# Patient Record
Sex: Female | Born: 1955 | Race: White | Hispanic: No | Smoking: Current every day smoker
Health system: Southern US, Community
[De-identification: ages and names within clinical notes are randomized; demographics above are authoritative.]

## PROBLEM LIST (undated history)

## (undated) DIAGNOSIS — I739 Peripheral vascular disease, unspecified: Secondary | ICD-10-CM

## (undated) DIAGNOSIS — F419 Anxiety disorder, unspecified: Secondary | ICD-10-CM

## (undated) DIAGNOSIS — G4730 Sleep apnea, unspecified: Secondary | ICD-10-CM

## (undated) DIAGNOSIS — R011 Cardiac murmur, unspecified: Secondary | ICD-10-CM

## (undated) DIAGNOSIS — H269 Unspecified cataract: Secondary | ICD-10-CM

## (undated) DIAGNOSIS — J45909 Unspecified asthma, uncomplicated: Secondary | ICD-10-CM

## (undated) DIAGNOSIS — R112 Nausea with vomiting, unspecified: Secondary | ICD-10-CM

## (undated) DIAGNOSIS — I10 Essential (primary) hypertension: Secondary | ICD-10-CM

## (undated) DIAGNOSIS — E785 Hyperlipidemia, unspecified: Secondary | ICD-10-CM

## (undated) DIAGNOSIS — R0902 Hypoxemia: Secondary | ICD-10-CM

## (undated) DIAGNOSIS — I779 Disorder of arteries and arterioles, unspecified: Secondary | ICD-10-CM

## (undated) DIAGNOSIS — B029 Zoster without complications: Secondary | ICD-10-CM

## (undated) DIAGNOSIS — J449 Chronic obstructive pulmonary disease, unspecified: Secondary | ICD-10-CM

## (undated) DIAGNOSIS — I509 Heart failure, unspecified: Secondary | ICD-10-CM

## (undated) DIAGNOSIS — E119 Type 2 diabetes mellitus without complications: Secondary | ICD-10-CM

## (undated) DIAGNOSIS — T7840XA Allergy, unspecified, initial encounter: Secondary | ICD-10-CM

## (undated) DIAGNOSIS — I2510 Atherosclerotic heart disease of native coronary artery without angina pectoris: Secondary | ICD-10-CM

## (undated) DIAGNOSIS — Z9889 Other specified postprocedural states: Secondary | ICD-10-CM

## (undated) DIAGNOSIS — I350 Nonrheumatic aortic (valve) stenosis: Secondary | ICD-10-CM

## (undated) DIAGNOSIS — I219 Acute myocardial infarction, unspecified: Secondary | ICD-10-CM

## (undated) HISTORY — PX: BREAST BIOPSY: SHX20

## (undated) HISTORY — DX: Hypoxemia: R09.02

## (undated) HISTORY — DX: Disorder of arteries and arterioles, unspecified: I77.9

## (undated) HISTORY — DX: Heart failure, unspecified: I50.9

## (undated) HISTORY — DX: Unspecified asthma, uncomplicated: J45.909

## (undated) HISTORY — DX: Unspecified cataract: H26.9

## (undated) HISTORY — PX: EYE SURGERY: SHX253

## (undated) HISTORY — DX: Morbid (severe) obesity due to excess calories: E66.01

## (undated) HISTORY — PX: ABDOMINAL HYSTERECTOMY: SHX81

## (undated) HISTORY — DX: Allergy, unspecified, initial encounter: T78.40XA

## (undated) HISTORY — DX: Peripheral vascular disease, unspecified: I73.9

## (undated) HISTORY — DX: Type 2 diabetes mellitus without complications: E11.9

## (undated) HISTORY — DX: Essential (primary) hypertension: I10

## (undated) HISTORY — DX: Cardiac murmur, unspecified: R01.1

## (undated) HISTORY — PX: CHOLECYSTECTOMY: SHX55

## (undated) HISTORY — PX: SPINAL FUSION: SHX223

---

## 1978-01-25 HISTORY — PX: TOTAL ABDOMINAL HYSTERECTOMY: SHX209

## 2000-05-09 ENCOUNTER — Inpatient Hospital Stay (HOSPITAL_COMMUNITY): Admission: RE | Admit: 2000-05-09 | Discharge: 2000-05-11 | Payer: Self-pay | Admitting: Obstetrics and Gynecology

## 2000-05-09 ENCOUNTER — Encounter: Payer: Self-pay | Admitting: Obstetrics and Gynecology

## 2000-06-28 ENCOUNTER — Encounter: Admission: RE | Admit: 2000-06-28 | Discharge: 2000-09-26 | Payer: Self-pay | Admitting: Endocrinology

## 2003-04-21 ENCOUNTER — Emergency Department (HOSPITAL_COMMUNITY): Admission: EM | Admit: 2003-04-21 | Discharge: 2003-04-21 | Payer: Self-pay | Admitting: Emergency Medicine

## 2004-04-20 ENCOUNTER — Ambulatory Visit: Payer: Self-pay | Admitting: Cardiology

## 2004-04-20 ENCOUNTER — Observation Stay (HOSPITAL_COMMUNITY): Admission: EM | Admit: 2004-04-20 | Discharge: 2004-04-21 | Payer: Self-pay | Admitting: *Deleted

## 2004-11-29 ENCOUNTER — Emergency Department (HOSPITAL_COMMUNITY): Admission: EM | Admit: 2004-11-29 | Discharge: 2004-11-29 | Payer: Self-pay | Admitting: Emergency Medicine

## 2004-12-02 ENCOUNTER — Ambulatory Visit: Payer: Self-pay | Admitting: Pulmonary Disease

## 2004-12-02 ENCOUNTER — Ambulatory Visit (HOSPITAL_COMMUNITY): Admission: RE | Admit: 2004-12-02 | Discharge: 2004-12-03 | Payer: Self-pay | Admitting: Neurosurgery

## 2006-12-03 ENCOUNTER — Emergency Department (HOSPITAL_COMMUNITY): Admission: EM | Admit: 2006-12-03 | Discharge: 2006-12-03 | Payer: Self-pay | Admitting: Emergency Medicine

## 2007-03-31 ENCOUNTER — Emergency Department (HOSPITAL_COMMUNITY): Admission: EM | Admit: 2007-03-31 | Discharge: 2007-03-31 | Payer: Self-pay | Admitting: Emergency Medicine

## 2007-04-10 ENCOUNTER — Encounter: Admission: RE | Admit: 2007-04-10 | Discharge: 2007-04-10 | Payer: Self-pay | Admitting: Occupational Medicine

## 2009-02-08 ENCOUNTER — Emergency Department (HOSPITAL_COMMUNITY): Admission: EM | Admit: 2009-02-08 | Discharge: 2009-02-08 | Payer: Self-pay | Admitting: Emergency Medicine

## 2009-05-10 ENCOUNTER — Emergency Department (HOSPITAL_COMMUNITY): Admission: EM | Admit: 2009-05-10 | Discharge: 2009-05-10 | Payer: Self-pay | Admitting: Emergency Medicine

## 2009-12-28 ENCOUNTER — Emergency Department (HOSPITAL_COMMUNITY)
Admission: EM | Admit: 2009-12-28 | Discharge: 2009-12-28 | Payer: Self-pay | Source: Home / Self Care | Admitting: Emergency Medicine

## 2010-04-14 LAB — CBC
HCT: 41.7 % (ref 36.0–46.0)
MCV: 91.9 fL (ref 78.0–100.0)
WBC: 11.5 10*3/uL — ABNORMAL HIGH (ref 4.0–10.5)

## 2010-04-14 LAB — BASIC METABOLIC PANEL
BUN: 15 mg/dL (ref 6–23)
Calcium: 8.8 mg/dL (ref 8.4–10.5)
Chloride: 100 mEq/L (ref 96–112)
GFR calc Af Amer: >60 mL/min (ref >60)
GFR calc non Af Amer: >60 mL/min (ref >60)
Potassium: 4.7 mEq/L (ref 3.5–5.1)

## 2010-04-14 LAB — DIFFERENTIAL
Basophils Absolute: 0 10*3/uL (ref 0.0–0.1)
Lymphs Abs: 2.3 10*3/uL (ref 0.7–4.0)
Neutro Abs: 8.4 10*3/uL — ABNORMAL HIGH (ref 1.7–7.7)
Neutrophils Relative %: 73 % (ref 43–77)

## 2010-04-14 LAB — RAPID STREP SCREEN (MED CTR MEBANE ONLY): Streptococcus, Group A Screen (Direct): NEGATIVE

## 2010-06-12 NOTE — Cardiovascular Report (Signed)
NAMECATY, [name]                ACCOUNT NO.:  [id number]   MEDICAL RECORD NO.:  [id number]          PATIENT TYPE:  INP   LOCATION:  2015                         FACILITY:  [facility]   PHYSICIAN:  [name], M.D.   DATE OF BIRTH:  [birth year]   DATE OF PROCEDURE:  04/21/2004  DATE OF DISCHARGE:                              CARDIAC CATHETERIZATION   PRIMARY CARE PHYSICIAN:  Dr. Clelia Aguilar.  Cardiologist Dr. Jonelle Aguilar.   INDICATIONS FOR PROCEDURE:  [name] is a 55 year old woman with cardiac  risk factors of diabetes mellitus and obesity with unknown lipid status, who  presented with atypical chest pain to [facility].  She underwent a  perfusion scan which was equivocal and the discomfort was concerning, so she  was referred for heart catheterization.   DESCRIPTION OF PROCEDURE:  After obtaining informed consent, the patient was  brought to the cardiac catheterization laboratory in the fasting state.  There, she was prepped and draped in the usual sterile manner and a local  anesthetic was obtained over the right groin using 1% lidocaine without  epinephrine.  The right femoral artery was cannulated using the modified  Seldinger technique with a 6-French, 10 cm sheath and left heart  catheterization was performed using a 6-French Judkins left #4, 6-French  Judkins right #4 and a 6-French pigtail catheter.  The pigtail catheter was  used for left ventriculography which was imaged in the 30 degree RAO view.  At the conclusion of the procedure, the catheters were removed.  The patient  was moved back to the cardiology holding area where the femoral artery  sheath was removed.  Hemostasis was obtained using direct manual pressure.  At the conclusion of the hold, there was no evidence of ecchymosis or  hematoma formation and distal pulses were intact.  Total fluoroscopic time  was 3 minutes.  Total ionized contrast 100 cc.   RESULTS:  Aortic pressure 107/62 with a mean  arterial pressure of 81 mmHg.   Left ventricular pressure 112/17 with end-diastolic pressure of 26 mmHg.   Coronary angiography:   The left main coronary artery is a moderate caliber vessel which is  angiographically normal.   The left anterior descending coronary artery is a moderate caliber vessel  with two diagonals and is angiographically normal.   The circumflex coronary artery is a moderate caliber, nondominant vessel  which has one large bifurcating obtuse marginal and two small obtuse  marginals and is angiographically normal.   The right coronary artery is a moderate caliber dominant vessel which has a  moderate caliber posterior descending coronary artery and is  angiographically normal.   Left ventriculogram reveals normal left ventricular systolic function.  No  wall motion abnormalities.  No mitral regurgitation.  Estimated ejection  fraction 60%.   ASSESSMENT:  1.  Nonobstructive coronary disease.  2.  Normal left ventricular systolic function.  3.  False positive myocardial perfusion imaging.   PLAN:  Discharge her to home later today.  She will follow up with Dr. Clelia Aguilar.  She will need a P.A. visit for a groin check  in 2 weeks in our office in  [place].      JH/MEDQ  D:  04/21/2004  T:  04/21/2004  Job:  [id number]   cc:   [name], M.D.  [place], [place]   [name], M.D. [facility]

## 2010-06-12 NOTE — Discharge Summary (Signed)
[name], [name]                ACCOUNT NO.:  [id number]   MEDICAL RECORD NO.:  [id number]          PATIENT TYPE:  INP   LOCATION:  2015                         FACILITY:  [facility]   PHYSICIAN:  Carole Binning, M.D. LHCDATE OF BIRTH:  [birth year]   DATE OF ADMISSION:  04/20/2004  DATE OF DISCHARGE:                                 DISCHARGE SUMMARY   PRIMARY CARE PHYSICIAN:  Kirstie Peri, M.D. [place].   CARDIOLOGIST:  Jonelle Sidle, M.D. [facility].   DISCHARGE DIAGNOSIS:  Status post cardiac catheterization, on April 21, 2004, by Dr. Dionicio Stall, showing nonobstructive coronary disease, normal  left ventricular systolic function, false positive myocardial perfusion  imaging.   PAST MEDICAL HISTORY:  1.  Diabetes mellitus.  2.  Obesity.  3.  Unknown lipid status.  4.  Recent perfusion scan at [facility] with reversible ischemia.   DISPOSITION:  The patient is being discharged home following cardiac  catheterization if no complications from cath site.   Her medications will include:  1.  Aspirin 325 mg daily.  2.  Actos 45 mg daily.  3.  Insulin as previously taken.   PAIN MANAGEMENT:  Tylenol for general discomfort.   ACTIVITY:  No driving x 2 days.  No lifting over 10 pounds x 1 week.   DIET:  Low fat, diabetic diet.   WOUND CARE:  Gently clean cath site with soap and water.  No tub bathing x 1  week.   She is encouraged to discontinue tobacco use.  She is also instructed to  call Dr. Ival Bible office for any fever, any pain, or swelling from the  cath site.   1.  She has a followup appointment already scheduled with Dr. Sherryll Burger on      Friday.  2.  I have scheduled her a followup appointment with Dr. Ival Bible P.A. at      the [facility] on Tuesday, May 05, 2004, at 11 a.m.   HOSPITAL COURSE:  Ms. [name] is a 55 year old Caucasian female with cardiac  risk factors including diabetes and obesity who presented initially  to  [facility], on April 18, 2004, with complaints of atypical chest  pain.  She underwent a perfusion scan at [facility] which was  positive for apical ischemia.  The patient was then referred to [facility] for cardiac catheterization on April 20, 2004.  On April 21, 2004,  this patient's cardiac cath lab results are as stated above.  The patient  tolerated the procedure without complications, currently on bedrest.  If no  further changes in status, she will be discharged home later today after her  bedrest ends and follow up at the [facility] and Dr. Sherryll Burger for her primary  care concerns.   LAB WORK:  On April 21, 2004, she had a hemoglobin of 13.1, hematocrit 38.5,  a platelet count of 286,000.  Potassium 4.8, BUN of 18, creatinine of 1.0,  and a glucose of 81.  EKG showed a normal sinus rhythm.  Cardiac enzymes at  [facility] show a troponin of 0.01 x 3.  I am unable to locate a  fasting lipid panel, recommend the patient have a lipid panel checked within  the next 4-6 weeks.      MB/MEDQ  D:  04/21/2004  T:  04/21/2004  Job:  [id number]

## 2010-06-12 NOTE — Op Note (Signed)
[name], [name]                ACCOUNT NO.:  [id number]   MEDICAL RECORD NO.:  [id number]          PATIENT TYPE:  OIB   LOCATION:  2550                         FACILITY:  [facility]   PHYSICIAN:  [name], M.D.DATE OF BIRTH:  20-Jan-1956   DATE OF PROCEDURE:  12/02/2004  DATE OF DISCHARGE:                                 OPERATIVE REPORT   BRIEF HISTORY:  The patient is a 55 year old white female who suffered from  neck and bilateral arm pain, and had a physical exam consistent with  cervical myelopathy.  She was worked up with a cervical MRI which  demonstrated a large herniated disk at C5-6 and C6-7 with severe spinal cord  compression.  I recommended surgery.  The patient has weighed the risks,  benefits and alternatives to the surgery and decided to proceed with a C5-6  and C6-7 anterior cervical diskectomy, fusion and plating.   PREOPERATIVE DIAGNOSIS:  C5-6 and C6-7 herniated nucleus pulposus,  spondylosis, spinal stenosis, cervical myelopathy, cervicalgia and cervical  radiculopathy.   POSTOPERATIVE DIAGNOSIS:  C5-6 and C6-7 herniated nucleus pulposus,  spondylosis, spinal stenosis, cervical myelopathy, cervicalgia and cervical  radiculopathy.   PROCEDURE:  C5-6 and C6-7 extensive anterior cervical  diskectomy/decompression; interbody iliac crest Apligraf arthrodesis;  anterior cervical plating (CODMAN Slim-LOC titanium plate and screws).   SURGEON:  [name], M.D.   ASSISTANT:  [name], M.D.   ANESTHESIA:  General endotracheal.   ESTIMATED BLOOD LOSS:  150 mL.   SPECIMENS:  None.   DRAINS:  None.   COMPLICATIONS:  None.   DESCRIPTION OF PROCEDURE:  The patient was brought to the operating room by  the anesthesia team.  General endotracheal anesthesia was induced.  The  patient remained in a supine position.  A roll was placed under her  shoulders to place her neck in slight extension.  The anterior cervical  region was then prepared with  Betadine scrub and Betadine solution, and  sterile drapes were applied.  I then injected the area to be incised with  Marcaine with epinephrine solution and used a scalpel to make a transverse  incision in the patient's left anterior neck.  I used the Metzenbaum  scissors to divide the platysma muscle and to dissect medial to the  sternocleidomastoid muscle, jugular vein and carotid artery.  I bluntly  dissected down towards the anterior cervical spine and carefully identified  the esophagus and retracted it medially.  We used Kitner swabs to clear the  soft tissue from the anterior cervical spine and then inserted a bent spinal  needle at the upper exposed intervertebral disk space.  We then obtained an  intraoperative radiograph to confirm our location.  We then used  electrocautery to detach the medial border of the longus colli muscles  bilaterally from the C5-6 and C6-7 intervertebral disk space.  We then  inserted the Caspar self-retaining retractor for exposure.  We then incised  the C6-7 intervertebral disk and performed a partial diskectomy using the  pituitary forceps and the Carlens curets.  We inserted distractions screws  in C6 and  C7 and distracted the interspace and then brought the operating  microscope into the field and under its magnification and illumination,  completed the microdissection/decompression.  I used a high-speed drill to  decorticate the vertebral endplates at C6-7 and drilled away the remainder  of the C6-7 intervertebral disk; this exposed the posterior longitudinal  ligament.  There was a large hole in the ligament in the midline and we  pulled several large fragments of herniated disk through this hole in the  posterior longitudinal ligament from the epidural space, somewhat  decompressing the epidural space.  We then used the Kerrison punches to  remove the remainder of the posterior longitudinal ligament, undercutting  the vertebral endplates at C6-7,  depressing the thecal sac and then we  performed generous foraminotomies about the bilateral C7 nerve roots,  completing the decompression at this level.   We then repeated this procedure in the analogous fashion at C5-6.  Again, we  found a large hole in the posterior longitudinal ligament and removed large  fragments of disk herniation through this hole in the ligament and we got  good decompression of the thecal sac and the bilateral C6 nerve root in an  analogous fashion as we did in C6-7.   We now turned our attention to the fusion.  We obtained iliac crest  tricortical allograft bone grafts and fashioned them to the approximate  dimensions of 7 mm in height and 1 cm in depth.  We inserted the bone graft  into the distracted C5-6 interspace and then distracted at C6-7 and then  placed the second bone graft into the C6-7 interspace.  There was a good  snug fit of bone graft at both levels.  We prepared to remove the  distraction screws.   We now turned our attention to the anterior spinal instrumentation.  We used  the high-speed drill to remove some ventral spondylosis from the C5-6 and C6-  7 intervertebral disk space so that the plate would lay down flat.  We  selected the appropriate length CODMAN Slim-LOC anterior cervical plate.  We  laid it along the anterior aspect of the vertebral bodies from C5 to C7.  We  drilled two 12-mm holes at C5, 6 and 7.  We secured the plates to the  vertebral bodies by placing two 12-mm self-tapping screws at C5, 6 and 7.  We obtained intraoperative radiograph, but because of the patient's body  habitus, we could only barely see the very top of the plate and screws, but  they looked good in vivo; we therefore secured the screws to the plate by  locking each cam.  We then obtained stringent hemostasis using bipolar electrocautery.  We copiously irrigated the wound out with Bacitracin.  We  removed the solution, then removed the Caspar  self-retaining retractor.  We  inspected the esophagus for any damage; there was none apparent.  We then  reapproximated the patient's platysma muscle with interrupted 3-0 Vicryl  suture, the subcutaneous tissue with interrupted 3-0 Vicryl suture and the  skin was Steri-Strips and Benzoin.  The wound was then coated with  Bacitracin ointment, a sterile dressing was applied, the drapes were removed  and the patient was subsequently extubated by the anesthesia team and  transported to the postanesthesia care unit in stable condition.  All  sponge, needle and instrument counts were correct at the end of this case.      [name], M.D.  Electronically Signed  JDJ/MEDQ  D:  12/02/2004  T:  12/03/2004  Job:  [id number]

## 2010-09-20 ENCOUNTER — Emergency Department (HOSPITAL_COMMUNITY): Payer: BC Managed Care – PPO

## 2010-09-20 ENCOUNTER — Emergency Department (HOSPITAL_COMMUNITY)
Admission: EM | Admit: 2010-09-20 | Discharge: 2010-09-20 | Disposition: A | Discharge status: Home / Self Care | Payer: BC Managed Care – PPO | Attending: Emergency Medicine | Admitting: Emergency Medicine

## 2010-09-20 DIAGNOSIS — I10 Essential (primary) hypertension: Secondary | ICD-10-CM | POA: Insufficient documentation

## 2010-09-20 DIAGNOSIS — E119 Type 2 diabetes mellitus without complications: Secondary | ICD-10-CM | POA: Insufficient documentation

## 2010-09-20 DIAGNOSIS — Z885 Allergy status to narcotic agent status: Secondary | ICD-10-CM | POA: Insufficient documentation

## 2010-09-20 DIAGNOSIS — Z882 Allergy status to sulfonamides status: Secondary | ICD-10-CM | POA: Insufficient documentation

## 2010-09-20 DIAGNOSIS — Z794 Long term (current) use of insulin: Secondary | ICD-10-CM | POA: Insufficient documentation

## 2010-09-20 DIAGNOSIS — Z7982 Long term (current) use of aspirin: Secondary | ICD-10-CM | POA: Insufficient documentation

## 2010-09-20 DIAGNOSIS — F17200 Nicotine dependence, unspecified, uncomplicated: Secondary | ICD-10-CM | POA: Insufficient documentation

## 2010-09-20 DIAGNOSIS — G43909 Migraine, unspecified, not intractable, without status migrainosus: Secondary | ICD-10-CM

## 2010-09-20 HISTORY — DX: Zoster without complications: B02.9

## 2010-09-20 LAB — POCT I-STAT, CHEM 8
BUN: 19 mg/dL (ref 6–23)
Calcium, Ion: 1.18 mmol/L (ref 1.12–1.32)
Chloride: 107 mEq/L (ref 96–112)
Creatinine, Ser: 1 mg/dL (ref 0.50–1.10)
Glucose, Bld: 163 mg/dL — ABNORMAL HIGH (ref 70–99)
HCT: 46 % (ref 36.0–46.0)
Hemoglobin: 15.6 g/dL — ABNORMAL HIGH (ref 12.0–15.0)
TCO2: 25 mmol/L (ref 0–100)

## 2010-09-20 MED ORDER — METOCLOPRAMIDE HCL 5 MG/ML IJ SOLN
10.0000 mg | Freq: Once | INTRAMUSCULAR | Status: AC
  Administered 2010-09-20: 10 mg via INTRAVENOUS
  Filled 2010-09-20: qty 2

## 2010-09-20 MED ORDER — SODIUM CHLORIDE 0.9 % IV SOLN
INTRAVENOUS | Status: DC
  Administered 2010-09-20: 11:00:00 via INTRAVENOUS

## 2010-09-20 MED ORDER — PROCHLORPERAZINE 25 MG RE SUPP: 25.0000 mg | Freq: Three times a day (TID) | RECTAL | Status: DC | PRN

## 2010-09-20 MED ORDER — PROCHLORPERAZINE 25 MG RE SUPP: 25.0000 mg | Freq: Three times a day (TID) | RECTAL | Status: AC | PRN

## 2010-09-20 NOTE — ED Notes (Signed)
Istat chem 8 initiated at this time.

## 2010-09-20 NOTE — ED Notes (Signed)
Spoke with MD atbout compazine suppository. MD states it was ordered in error. She wanted pt to have prescription instead of dose here.

## 2010-09-20 NOTE — ED Provider Notes (Signed)
Scribed for Ward Givens, MD, the patient was seen in room APA12/APA12. This chart was scribed by [employer]. The patient's care started at 10:17  CSN: [id number] Arrival date & time: 09/20/2010 10:15 AM  Chief Complaint  Patient presents with  . Headache  . Dizziness  . Otalgia   HPI [name] is a 55 y.o. female with a history of Shingles, HTN and diabetes mellitus who presents to the Emergency Department complaining of Throbbing Headache. Associated symptoms include nausea, dizzyness and Oltagia. She localizes headache to the right forehead and states that it radiates to the occipital region. Patient states that headache "throbs like a heartbeat" and places it at 8/10 on NPS. Headache is not aggravated by noise or light. She reports that "everything is shifted to the right" when she's dizzy. She denies alcohol use, neck pain, activity change, vomiting, visual disturbance, numbness in arms or legs, fevers or congestion. She reports a history of shingles on the right side of her head, 4 years ago. Patient also has a history of headaches, usually alleviated by tylenol. 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 There are no other associated symptoms and no other alleviating or aggravating factors.  PCP is Dr Sherryll Burger. HPI ELEMENTS:  Pt noticed yesterday she felt like she was off-balance and falling to the right. Had some nausea today without vomiting. Has normal appetite and her CBG at home was 150 this morning. States this headache is different in character from her prior headaches and when she had the shingles she never had the balance problems.   Location: Head  Duration: 1 week  Quality: throbbing  Severity: 8/10  Modifying factors: not aggravated by light or noise Context:  as above  Associated symptoms: as above    Past Medical History  Diagnosis Date  . Shingles   . Diabetes mellitus   . Hypertension    MEDICATIONS:  Previous Medications   No medications on  file   levemir insulin novalog insulin ASA 81 mg daily Lisinopril 5 mg QD Glimepiride 4 mg BID  ALLERGIES:  Allergies as of 09/20/2010 - Review Complete 09/20/2010  Allergen Reaction Noted  . Codeine  09/20/2010  . Sulfur  09/20/2010      Past Surgical History  Procedure Date  . Abdominal hysterectomy   . Cholecystectomy   . Spinal fusion     No family history on file.  History  Substance Use Topics  . Smoking status: Current Everyday Smoker  . Smokeless tobacco: Not on file  . Alcohol Use: No   Lives with husband, employed  PCP Dr Sherryll Burger in [place]   Review of Systems  All other systems reviewed and are negative.    Physical Exam  BP 140/59  Pulse 67  Temp(Src) 98.2 F (36.8 C) (Oral)  Resp 21  Ht 5\' 4"  (1.626 m)  Wt 250 lb (113.399 kg)  BMI 42.91 kg/m2  SpO2 97%  Physical Exam  Constitutional: Vital signs are normal. She appears well-developed and well-nourished. She is cooperative.  HENT:  Head: Normocephalic and atraumatic.  Right Ear: Hearing, tympanic membrane, external ear and ear canal normal.  Nose: Nose normal.  Mouth/Throat: Uvula is midline. Mucous membranes are dry. No oropharyngeal exudate, posterior oropharyngeal edema, posterior oropharyngeal erythema or tonsillar abscesses.       Nontender maxillary and frontal sinuses  Eyes: Conjunctivae, EOM and lids are normal. Pupils are equal, round, and reactive to light.  Neck: Normal range of motion and full  passive range of motion without pain. Neck supple.  Cardiovascular: Normal rate, regular rhythm and normal heart sounds.   Pulmonary/Chest: Effort normal and breath sounds normal.  Musculoskeletal:       No gross motor abnormalities  Neurological: She is alert. She has normal strength. No cranial nerve deficit or sensory deficit.       Pt has no facial droop, has mild right pronator drift, has minimal difficulty with FTN on the right, easily done on the left, no weakness noted in her legs. No  nystagymus  Skin: Skin is warm and dry. No rash noted.  Psychiatric: Her speech is normal and behavior is normal. Judgment and thought content normal. Her affect is blunt.    ED Course  Procedures  OTHER DATA REVIEWED: Nursing notes, vital signs, and past medical records reviewed.   DIAGNOSTIC STUDIES: Oxygen Saturation is 97% on room air, normal by my interpretation.     LABS / RADIOLOGY:  Results for orders placed during the hospital encounter of 09/20/10  POCT I-STAT, CHEM 8      Component Value Range   Sodium 142  135 - 145 (mEq/L)   Potassium 4.2  3.5 - 5.1 (mEq/L)   Chloride 107  96 - 112 (mEq/L)   BUN 19  6 - 23 (mg/dL)   Creatinine, Ser 1.61  0.50 - 1.10 (mg/dL)   Glucose, Bld 096 (*) 70 - 99 (mg/dL)   Calcium, Ion 0.45  4.09 - 1.32 (mmol/L)   TCO2 25  0 - 100 (mmol/L)   Hemoglobin 15.6 (*) 12.0 - 15.0 (g/dL)   HCT 81.1  91.4 - 78.2 (%)     ED COURSE / COORDINATION OF CARE: 10:40 - EDMD examined patient and ordered a CT Head Wo Contrast.  MDM: Results for orders placed during the hospital encounter of 09/20/10  POCT I-STAT, CHEM 8      Component Value Range   Sodium 142  135 - 145 (mEq/L)   Potassium 4.2  3.5 - 5.1 (mEq/L)   Chloride 107  96 - 112 (mEq/L)   BUN 19  6 - 23 (mg/dL)   Creatinine, Ser 9.56  0.50 - 1.10 (mg/dL)   Glucose, Bld 213 (*) 70 - 99 (mg/dL)   Calcium, Ion 0.86  5.78 - 1.32 (mmol/L)   TCO2 25  0 - 100 (mmol/L)   Hemoglobin 15.6 (*) 12.0 - 15.0 (g/dL)   HCT 46.9  62.9 - 52.8 (%)   Ct Head Wo Contrast  09/20/2010  *RADIOLOGY REPORT*  Clinical Data: Headaches, dizziness  CT HEAD WITHOUT CONTRAST  Technique:  Contiguous axial images were obtained from the base of the skull through the vertex without contrast.  Comparison: 05/10/2009  Findings: There is no evidence of acute intracranial hemorrhage, brain edema, mass lesion, acute infarction,   mass effect, or midline shift. Acute infarct may be inapparent on noncontrast CT. No other  intra-axial abnormalities are seen, and the ventricles and sulci are within normal limits in size and symmetry.   No abnormal extra-axial fluid collections or masses are identified.  No significant calvarial abnormality.  IMPRESSION: 1. Negative for bleed or other acute intracranial process.  Original Report Authenticated By: Osa Craver, M.D.    PT given IV reglan and her headache is almost gone. Feels ready to go home.   IMPRESSION:  Pt's headache is most consistant with migraine although she does not have photsensitivity.    MEDICATIONS GIVEN IN THE E.D.  Medications  0.9 %  sodium chloride infusion (not administered)  metoCLOPramide (REGLAN) injection 10 mg (not administered)    DISCHARGE MEDICATIONS: New Prescriptions   No medications on file       Scribe Attestation I personally performed the services described in this documentation, which was scribed in my presence. The recorded information has been reviewed and considered. Ward Givens, MD   Devoria Albe Thomasenia Sales, MD 09/20/10 1257

## 2010-09-20 NOTE — ED Notes (Signed)
Pt reports ha, dizziness, nausea and rt ear pain for the past few days.  Pt denies any weakness, blurred vision, or forgetfulness.  Pt reports the headache only staying on the right side of her head.  Pt reports hx of shingles on the rt side of her head and face 4 yrs ago.

## 2012-01-14 ENCOUNTER — Emergency Department (HOSPITAL_COMMUNITY)
Admission: EM | Admit: 2012-01-14 | Discharge: 2012-01-14 | Disposition: A | Discharge status: Home / Self Care | Payer: BC Managed Care – PPO | Attending: Emergency Medicine | Admitting: Emergency Medicine

## 2012-01-14 ENCOUNTER — Emergency Department (HOSPITAL_COMMUNITY): Payer: BC Managed Care – PPO

## 2012-01-14 ENCOUNTER — Encounter (HOSPITAL_COMMUNITY): Payer: Self-pay

## 2012-01-14 DIAGNOSIS — J4489 Other specified chronic obstructive pulmonary disease: Secondary | ICD-10-CM | POA: Insufficient documentation

## 2012-01-14 DIAGNOSIS — Z79899 Other long term (current) drug therapy: Secondary | ICD-10-CM | POA: Insufficient documentation

## 2012-01-14 DIAGNOSIS — E119 Type 2 diabetes mellitus without complications: Secondary | ICD-10-CM | POA: Insufficient documentation

## 2012-01-14 DIAGNOSIS — J111 Influenza due to unidentified influenza virus with other respiratory manifestations: Secondary | ICD-10-CM | POA: Insufficient documentation

## 2012-01-14 DIAGNOSIS — Z7982 Long term (current) use of aspirin: Secondary | ICD-10-CM | POA: Insufficient documentation

## 2012-01-14 DIAGNOSIS — Z8619 Personal history of other infectious and parasitic diseases: Secondary | ICD-10-CM | POA: Insufficient documentation

## 2012-01-14 DIAGNOSIS — J029 Acute pharyngitis, unspecified: Secondary | ICD-10-CM | POA: Insufficient documentation

## 2012-01-14 DIAGNOSIS — R6883 Chills (without fever): Secondary | ICD-10-CM | POA: Insufficient documentation

## 2012-01-14 DIAGNOSIS — Z794 Long term (current) use of insulin: Secondary | ICD-10-CM | POA: Insufficient documentation

## 2012-01-14 DIAGNOSIS — R52 Pain, unspecified: Secondary | ICD-10-CM | POA: Insufficient documentation

## 2012-01-14 DIAGNOSIS — R5381 Other malaise: Secondary | ICD-10-CM | POA: Insufficient documentation

## 2012-01-14 DIAGNOSIS — R0789 Other chest pain: Secondary | ICD-10-CM | POA: Insufficient documentation

## 2012-01-14 DIAGNOSIS — I10 Essential (primary) hypertension: Secondary | ICD-10-CM | POA: Insufficient documentation

## 2012-01-14 DIAGNOSIS — M549 Dorsalgia, unspecified: Secondary | ICD-10-CM | POA: Insufficient documentation

## 2012-01-14 DIAGNOSIS — J3489 Other specified disorders of nose and nasal sinuses: Secondary | ICD-10-CM | POA: Insufficient documentation

## 2012-01-14 DIAGNOSIS — J449 Chronic obstructive pulmonary disease, unspecified: Secondary | ICD-10-CM | POA: Insufficient documentation

## 2012-01-14 HISTORY — DX: Chronic obstructive pulmonary disease, unspecified: J44.9

## 2012-01-14 LAB — CBC WITH DIFFERENTIAL/PLATELET
Basophils Relative: 0 % (ref 0–1)
HCT: 44.9 % (ref 36.0–46.0)
Lymphocytes Relative: 9 % — ABNORMAL LOW (ref 12–46)
MCH: 30.9 pg (ref 26.0–34.0)
MCHC: 32.7 g/dL (ref 30.0–36.0)
Monocytes Absolute: 1.1 10*3/uL — ABNORMAL HIGH (ref 0.1–1.0)
Monocytes Relative: 9 % (ref 3–12)
Neutro Abs: 9.2 10*3/uL — ABNORMAL HIGH (ref 1.7–7.7)
Neutrophils Relative %: 81 % — ABNORMAL HIGH (ref 43–77)
RBC: 4.76 MIL/uL (ref 3.87–5.11)
RDW: 13.6 % (ref 11.5–15.5)
WBC: 11.3 10*3/uL — ABNORMAL HIGH (ref 4.0–10.5)

## 2012-01-14 LAB — BASIC METABOLIC PANEL
BUN: 16 mg/dL (ref 6–23)
Chloride: 101 mEq/L (ref 96–112)
Creatinine, Ser: 0.73 mg/dL (ref 0.50–1.10)
GFR calc Af Amer: >90 mL/min (ref >90)
GFR calc non Af Amer: >90 mL/min (ref >90)
Potassium: 4.5 mEq/L (ref 3.5–5.1)
Sodium: 134 mEq/L — ABNORMAL LOW (ref 135–145)

## 2012-01-14 MED ORDER — IBUPROFEN 800 MG PO TABS
800.0000 mg | ORAL_TABLET | Freq: Once | ORAL | Status: AC
  Administered 2012-01-14: 800 mg via ORAL
  Filled 2012-01-14: qty 1

## 2012-01-14 MED ORDER — ALBUTEROL SULFATE (5 MG/ML) 0.5% IN NEBU
5.0000 mg | INHALATION_SOLUTION | Freq: Once | RESPIRATORY_TRACT | Status: AC
  Administered 2012-01-14: 5 mg via RESPIRATORY_TRACT
  Filled 2012-01-14: qty 1

## 2012-01-14 MED ORDER — IPRATROPIUM BROMIDE 0.02 % IN SOLN
0.5000 mg | Freq: Once | RESPIRATORY_TRACT | Status: AC
  Administered 2012-01-14: 0.5 mg via RESPIRATORY_TRACT
  Filled 2012-01-14: qty 2.5

## 2012-01-14 MED ORDER — OXYCODONE-ACETAMINOPHEN 5-325 MG PO TABS
1.0000 | ORAL_TABLET | Freq: Once | ORAL | Status: AC
  Administered 2012-01-14: 1 via ORAL
  Filled 2012-01-14: qty 1

## 2012-01-14 MED ORDER — ACETAMINOPHEN 325 MG PO TABS
650.0000 mg | ORAL_TABLET | Freq: Once | ORAL | Status: AC
  Administered 2012-01-14: 650 mg via ORAL
  Filled 2012-01-14: qty 1

## 2012-01-14 MED ORDER — OSELTAMIVIR PHOSPHATE 75 MG PO CAPS: 75.0000 mg | ORAL_CAPSULE | Freq: Two times a day (BID) | ORAL | Status: DC

## 2012-01-14 MED ORDER — ONDANSETRON 8 MG PO TBDP
8.0000 mg | ORAL_TABLET | Freq: Once | ORAL | Status: AC
  Administered 2012-01-14: 8 mg via ORAL
  Filled 2012-01-14: qty 1

## 2012-01-14 NOTE — ED Notes (Signed)
Pulse oximetry 77 on room air, Pt placed on 2 LPM [place], SAO2 increased to 85. Oxygen increased to 3 LPM [place] without increase in sats. O2 increased to 4 LPM [place]. Pt also febrile at this time. Work of breathing also increased. Dr Bebe Shaggy notified.

## 2012-01-14 NOTE — ED Provider Notes (Signed)
History  This chart was scribed for Morgan Gaskins, MD by Ardeen Jourdain, ED Scribe. This patient was seen in room APA09/APA09 and the patient's care was started at 0923.  CSN: [id number]  Arrival date & time 01/14/12  0906   First MD Initiated Contact with Patient 01/14/12 [phone]      Chief Complaint  Patient presents with  . Influenza     Patient is a 56 y.o. female presenting with flu symptoms. The history is provided by the patient. No language interpreter was used.  Influenza This is a new problem. The current episode started 12 to 24 hours ago. The problem occurs constantly. The problem has been gradually worsening. Pertinent negatives include no chest pain, no abdominal pain and no shortness of breath. Nothing aggravates the symptoms. Nothing relieves the symptoms.    [name] is a 56 y.o. female who presents to the Emergency Department complaining of cough with associated generalized body aches, chills, back pain, chest tightness, fatigued, sore throat and congestion. She states the symptoms started suddenly last night, have remained constant and are gradually worsening. She reports her back pain is aggravated by cough and deep breathing solely. She states nothing else aggravates or reproduces the pain. She reports taking Aleve for the pain with no relief.  Past Medical History  Diagnosis Date  . Shingles   . Diabetes mellitus   . Hypertension   . COPD (chronic obstructive pulmonary disease)     Past Surgical History  Procedure Date  . Abdominal hysterectomy   . Cholecystectomy   . Spinal fusion     No family history on file.  History  Substance Use Topics  . Smoking status: Current Every Day Smoker    Types: Cigarettes  . Smokeless tobacco: Not on file  . Alcohol Use: No   No OB history available.   Review of Systems  Constitutional: Positive for fever, chills and fatigue. Negative for diaphoresis and appetite change.  Respiratory: Positive for cough  and chest tightness. Negative for shortness of breath and wheezing.   Cardiovascular: Negative for chest pain.  Gastrointestinal: Negative for nausea, vomiting, abdominal pain and diarrhea.  Musculoskeletal: Positive for back pain.  Neurological: Negative for weakness, light-headedness and numbness.  All other systems reviewed and are negative.    Allergies  Codeine; Sulfa antibiotics; and Sulfur  Home Medications   Current Outpatient Rx  Name  Route  Sig  Dispense  Refill  . ASPIRIN EC 81 MG PO TBEC   Oral   Take 81 mg by mouth daily.           [name] GLIMEPIRIDE 4 MG PO TABS   Oral   Take 4 mg by mouth 2 (two) times daily.           . INSULIN ASPART 100 UNIT/ML [place] SOLN   Subcutaneous   Inject 35 Units into the skin 2 (two) times daily. Patient uses sliding scale based on blood glucose readings         . INSULIN DETEMIR 100 UNIT/ML [place] SOLN   Subcutaneous   Inject 60 Units into the skin 2 (two) times daily.          [name] LISINOPRIL 5 MG PO TABS   Oral   Take 5 mg by mouth daily.           [name] METFORMIN HCL 1000 MG PO TABS   Oral   Take 1,000 mg by mouth 2 (two) times daily with a meal.         .  TIOTROPIUM BROMIDE MONOHYDRATE 18 MCG IN CAPS   Inhalation   Place 18 mcg into inhaler and inhale daily.             Triage Vitals: BP 130/54  Pulse 92  Temp 99.4 F (37.4 C) (Oral)  Resp 24  Ht 5\' 3"  (1.6 m)  Wt 270 lb (122.471 kg)  BMI 47.83 kg/m2  SpO2 94%  Physical Exam  CONSTITUTIONAL: Well developed/well nourished HEAD AND FACE: Normocephalic/atraumatic EYES: EOMI/PERRL ENMT: Mucous membranes moist NECK: supple no meningeal signs SPINE:entire spine nontender CV: S1/S2 noted, no murmurs/rubs/gallops noted LUNGS: no apparent distress, scattered wheezes bilaterally ABDOMEN: soft, nontender, no rebound or guarding GU:no cva tenderness NEURO: Pt is awake/alert, moves all extremitiesx4 EXTREMITIES: pulses normal, full ROM SKIN: warm, color  normal PSYCH: no abnormalities of mood noted     ED Course  Procedures   DIAGNOSTIC STUDIES: Oxygen Saturation is 94% on room air, adequate by my interpretation.    COORDINATION OF CARE:   9:31 AM: Discussed treatment plan which includes fluids, a breathing treatment, CXR and pain medication with pt at bedside and pt agreed to plan.  10:21 AM: Medication Orders: albuterol (PROVENTIL) (5 MG/ML) 0.5% nebulizer solution 5 mg Once, ondansetron (ZOFRAN-ODT) disintegrating tablet 8 mg Once, oxyCODONE-acetaminophen (PERCOCET/ROXICET) 5-325 MG per tablet 1 tablet Once, ibuprofen (ADVIL,MOTRIN) tablet 800 mg Once   11:24 AM: Pt rechecked, she's febrile and now on 4 L O2    Monitored in ED for several hrs.  I suspect she has influenza.  She is higher risk with COPD.  She had mild hypoxia that did improve with nebs.  She has home CPAP with O2 but doesn't use O2 all the time.  Her pain has improved, no longer having back pain and no CP.  I doubt PE at this time.  I did offer admission for further resp treaments and monitoring but she wants to go home.  Will start tamiflu.  Discussed strict return precautions Discussed need to stop smoking    MDM  Nursing notes including past medical history and social history reviewed and considered in documentation Labs/vital reviewed and considered xrays reviewed and considered       Date: 01/14/2012  Rate: 87  Rhythm: normal sinus rhythm  QRS Axis: normal  Intervals: normal  ST/T Wave abnormalities: nonspecific ST changes  Conduction Disutrbances:none  Narrative Interpretation:   Old EKG Reviewed: unchanged    I personally performed the services described in this documentation, which was scribed in my presence. The recorded information has been reviewed and is accurate.      Morgan Gaskins, MD 01/14/12 [phone]

## 2012-01-14 NOTE — ED Notes (Signed)
Pt stated she thinks she has the flu, has taken only aleve for the symptoms. Having back pain and cough.

## 2012-10-19 ENCOUNTER — Encounter (HOSPITAL_COMMUNITY): Payer: Self-pay | Admitting: Emergency Medicine

## 2012-10-19 ENCOUNTER — Emergency Department (HOSPITAL_COMMUNITY)
Admission: EM | Admit: 2012-10-19 | Discharge: 2012-10-20 | Disposition: A | Discharge status: Home / Self Care | Payer: BC Managed Care – PPO | Attending: Emergency Medicine | Admitting: Emergency Medicine

## 2012-10-19 DIAGNOSIS — J449 Chronic obstructive pulmonary disease, unspecified: Secondary | ICD-10-CM | POA: Insufficient documentation

## 2012-10-19 DIAGNOSIS — Z7982 Long term (current) use of aspirin: Secondary | ICD-10-CM | POA: Insufficient documentation

## 2012-10-19 DIAGNOSIS — Z79899 Other long term (current) drug therapy: Secondary | ICD-10-CM | POA: Insufficient documentation

## 2012-10-19 DIAGNOSIS — M5416 Radiculopathy, lumbar region: Secondary | ICD-10-CM

## 2012-10-19 DIAGNOSIS — J4489 Other specified chronic obstructive pulmonary disease: Secondary | ICD-10-CM | POA: Insufficient documentation

## 2012-10-19 DIAGNOSIS — F17200 Nicotine dependence, unspecified, uncomplicated: Secondary | ICD-10-CM | POA: Insufficient documentation

## 2012-10-19 DIAGNOSIS — Z8619 Personal history of other infectious and parasitic diseases: Secondary | ICD-10-CM | POA: Insufficient documentation

## 2012-10-19 DIAGNOSIS — IMO0001 Reserved for inherently not codable concepts without codable children: Secondary | ICD-10-CM | POA: Insufficient documentation

## 2012-10-19 DIAGNOSIS — E119 Type 2 diabetes mellitus without complications: Secondary | ICD-10-CM | POA: Insufficient documentation

## 2012-10-19 DIAGNOSIS — IMO0002 Reserved for concepts with insufficient information to code with codable children: Secondary | ICD-10-CM | POA: Insufficient documentation

## 2012-10-19 DIAGNOSIS — I10 Essential (primary) hypertension: Secondary | ICD-10-CM | POA: Insufficient documentation

## 2012-10-19 DIAGNOSIS — Z794 Long term (current) use of insulin: Secondary | ICD-10-CM | POA: Insufficient documentation

## 2012-10-19 MED ORDER — DIAZEPAM 5 MG PO TABS: 5.0000 mg | ORAL_TABLET | Freq: Three times a day (TID) | ORAL | Status: DC | PRN

## 2012-10-19 MED ORDER — OXYCODONE-ACETAMINOPHEN 5-325 MG PO TABS: 1.0000 | ORAL_TABLET | ORAL | Status: DC | PRN

## 2012-10-19 MED ORDER — DEXAMETHASONE 10 MG/ML FOR PEDIATRIC ORAL USE
10.0000 mg | Freq: Once | INTRAMUSCULAR | Status: AC
  Administered 2012-10-19: 10 mg via ORAL
  Filled 2012-10-19: qty 1

## 2012-10-19 MED ORDER — IBUPROFEN 400 MG PO TABS
600.0000 mg | ORAL_TABLET | Freq: Once | ORAL | Status: AC
  Administered 2012-10-19: 600 mg via ORAL
  Filled 2012-10-19: qty 2

## 2012-10-19 MED ORDER — HYDROMORPHONE HCL PF 1 MG/ML IJ SOLN
1.0000 mg | Freq: Once | INTRAMUSCULAR | Status: AC
  Administered 2012-10-19: 1 mg via INTRAMUSCULAR
  Filled 2012-10-19: qty 1

## 2012-10-19 MED ORDER — DIAZEPAM 5 MG PO TABS
5.0000 mg | ORAL_TABLET | Freq: Once | ORAL | Status: AC
  Administered 2012-10-19: 5 mg via ORAL
  Filled 2012-10-19: qty 1

## 2012-10-19 NOTE — ED Provider Notes (Signed)
CSN: [id number]     Arrival date & time 10/19/12  2237 History  This chart was scribed for Raeford Razor, MD by Bennett Scrape, ED Scribe. This patient was seen in room APA07/APA07 and the patient's care was started at 11:00 PM.   Chief Complaint  Patient presents with  . Hip Pain    The history is provided by the patient. No language interpreter was used.   HPI Comments: [name] is a 57 y.o. female who presents to the Emergency Department complaining of sudden onset left posterior hip pain that started while at work yesterday. She states that she was getting up from her desk at the time. She describes the pain as sharp with radiation down the side of her left leg to her knee that has been persistent and gradually worsening since the onset. She states that the standing improves her pain and she denies having difficulty ambulating. She denies any known injuries. She reports that she has been taking Advil with minimal relief. Pt denies having prior episodes of similar symptoms. She denies having a h/o prior back surgeries or chronic back problems. She denies any numbness or paresthesia, leg weakness and urinary symptoms as associated symptoms.  Past Medical History  Diagnosis Date  . Shingles   . Diabetes mellitus   . Hypertension   . COPD (chronic obstructive pulmonary disease)    Past Surgical History  Procedure Laterality Date  . Abdominal hysterectomy    . Cholecystectomy    . Spinal fusion     No family history on file. History  Substance Use Topics  . Smoking status: Current Every Day Smoker    Types: Cigarettes  . Smokeless tobacco: Not on file  . Alcohol Use: No   No OB history provided.  Review of Systems  Genitourinary: Negative for dysuria, urgency and frequency.  Musculoskeletal: Positive for myalgias. Negative for back pain.  Neurological: Negative for weakness and numbness.  All other systems reviewed and are negative.    Allergies  Codeine-causes  nausea per pt; Sulfa antibiotics; and Sulfur  Home Medications   Current Outpatient Rx  Name  Route  Sig  Dispense  Refill  . aspirin EC 81 MG tablet   Oral   Take 81 mg by mouth daily.           [name] escitalopram (LEXAPRO) 10 MG tablet   Oral   Take 10 mg by mouth daily.         . insulin aspart (NOVOLOG FLEXPEN) 100 UNIT/ML injection   Subcutaneous   Inject 60 Units into the skin 2 (two) times daily. Patient uses sliding scale based on blood glucose readings         . insulin detemir (LEVEMIR FLEXPEN) 100 UNIT/ML injection   Subcutaneous   Inject 35 Units into the skin 3 (three) times daily. In addition to sliding scale instructions         . JANUVIA 100 MG tablet   Oral   Take 100 mg by mouth daily.         [name] lisinopril (PRINIVIL,ZESTRIL) 5 MG tablet   Oral   Take 5 mg by mouth daily.           . metFORMIN (GLUCOPHAGE) 1000 MG tablet   Oral   Take 1,000 mg by mouth 2 (two) times daily with a meal.         . tiotropium (SPIRIVA) 18 MCG inhalation capsule   Inhalation   Place 18 mcg  into inhaler and inhale daily.           [name] albuterol (PROVENTIL) (2.5 MG/3ML) 0.083% nebulizer solution   Nebulization   Take 2.5 mg by nebulization every 6 (six) hours as needed. Shortness of Breath          Triage Vitals: BP 131/52  Pulse 74  Temp(Src) 98.2 F (36.8 C) (Oral)  Resp 18  Ht 5\' 4"  (1.626 m)  Wt 250 lb (113.399 kg)  BMI 42.89 kg/m2  SpO2 95%  Physical Exam  Nursing note and vitals reviewed. Constitutional: She is oriented to person, place, and time. She appears well-developed and well-nourished. No distress.  HENT:  Head: Normocephalic and atraumatic.  Eyes: EOM are normal.  Neck: Neck supple. No tracheal deviation present.  Cardiovascular: Normal rate and regular rhythm.   No murmur heard. Pulmonary/Chest: Effort normal and breath sounds normal. No respiratory distress.  Musculoskeletal: Normal range of motion.  Tenderness in left buttock and  left posterior thigh, no midline spine tenderness, flexion and extension are 5/5, good plantar and dorsal flexion of the foot, palpable DP pulse, sensation is intact, 1+ patellar reflex  Neurological: She is alert and oriented to person, place, and time. She has normal reflexes.  Skin: Skin is warm and dry.  Psychiatric: She has a normal mood and affect. Her behavior is normal.    ED Course  Procedures (including critical care time)  DIAGNOSTIC STUDIES: Oxygen Saturation is 95% on room air, adequate by my interpretation.    COORDINATION OF CARE: 11:05 PM-Advised pt that her symptoms are most likely sciatica which can resolve on its own but can also be reoccurring. Discussed treatment plan which includes medications and rest with pt at bedside and pt agreed to plan. Pt has a ride home.  Labs Review Labs Reviewed - No data to display Imaging Review No results found.  MDM   1. Lumbar radiculopathy    56yF with likely lumbar radiculopathy. No "red flags." Symptomatic tx at this time.   I personally preformed the services scribed in my presence. The recorded information has been reviewed is accurate. Raeford Razor, MD.    Raeford Razor, MD 10/24/12 [phone]

## 2012-10-19 NOTE — ED Notes (Signed)
Pt complaining of left hip pain, states that it starts at her lower back and radiates down in to her left lower leg, denies any injury at this time, states that it started yesterday while at work.

## 2012-10-19 NOTE — ED Notes (Signed)
Sudden onset of hip pain at work yesterday - no known injury.  Taking Advil without relief

## 2013-10-15 ENCOUNTER — Emergency Department (HOSPITAL_COMMUNITY): Payer: BC Managed Care – PPO

## 2013-10-15 ENCOUNTER — Emergency Department (HOSPITAL_COMMUNITY)
Admission: EM | Admit: 2013-10-15 | Discharge: 2013-10-15 | Disposition: A | Discharge status: Home / Self Care | Payer: BC Managed Care – PPO | Attending: Emergency Medicine | Admitting: Emergency Medicine

## 2013-10-15 ENCOUNTER — Encounter (HOSPITAL_COMMUNITY): Payer: Self-pay | Admitting: Emergency Medicine

## 2013-10-15 DIAGNOSIS — I10 Essential (primary) hypertension: Secondary | ICD-10-CM | POA: Diagnosis not present

## 2013-10-15 DIAGNOSIS — Z794 Long term (current) use of insulin: Secondary | ICD-10-CM | POA: Diagnosis not present

## 2013-10-15 DIAGNOSIS — Z792 Long term (current) use of antibiotics: Secondary | ICD-10-CM | POA: Insufficient documentation

## 2013-10-15 DIAGNOSIS — Z79899 Other long term (current) drug therapy: Secondary | ICD-10-CM | POA: Insufficient documentation

## 2013-10-15 DIAGNOSIS — E119 Type 2 diabetes mellitus without complications: Secondary | ICD-10-CM | POA: Insufficient documentation

## 2013-10-15 DIAGNOSIS — F17200 Nicotine dependence, unspecified, uncomplicated: Secondary | ICD-10-CM | POA: Insufficient documentation

## 2013-10-15 DIAGNOSIS — J441 Chronic obstructive pulmonary disease with (acute) exacerbation: Secondary | ICD-10-CM | POA: Diagnosis not present

## 2013-10-15 DIAGNOSIS — R0602 Shortness of breath: Secondary | ICD-10-CM | POA: Diagnosis present

## 2013-10-15 DIAGNOSIS — Z8619 Personal history of other infectious and parasitic diseases: Secondary | ICD-10-CM | POA: Insufficient documentation

## 2013-10-15 LAB — BASIC METABOLIC PANEL
ANION GAP: 14 (ref 5–15)
BUN: 17 mg/dL (ref 6–23)
CALCIUM: 9.3 mg/dL (ref 8.4–10.5)
CHLORIDE: 100 meq/L (ref 96–112)
CO2: 27 mEq/L (ref 19–32)
CREATININE: 0.85 mg/dL (ref 0.50–1.10)
GFR, EST AFRICAN AMERICAN: 86 mL/min — AB (ref >90)
GFR, EST NON AFRICAN AMERICAN: 75 mL/min — AB (ref >90)
Glucose, Bld: 176 mg/dL — ABNORMAL HIGH (ref 70–99)
Potassium: 4.6 mEq/L (ref 3.7–5.3)
Sodium: 141 mEq/L (ref 137–147)

## 2013-10-15 LAB — TROPONIN I: Troponin I: <0.3 ng/mL

## 2013-10-15 LAB — CBC
HCT: 48.7 % — ABNORMAL HIGH (ref 36.0–46.0)
Hemoglobin: 16 g/dL — ABNORMAL HIGH (ref 12.0–15.0)
MCH: 31.4 pg (ref 26.0–34.0)
MCHC: 32.9 g/dL (ref 30.0–36.0)
MCV: 95.5 fL (ref 78.0–100.0)
Platelets: 227 10*3/uL (ref 150–400)
RBC: 5.1 MIL/uL (ref 3.87–5.11)
RDW: 13.6 % (ref 11.5–15.5)
WBC: 12.8 10*3/uL — ABNORMAL HIGH (ref 4.0–10.5)

## 2013-10-15 LAB — CBG MONITORING, ED: Glucose-Capillary: 169 mg/dL — ABNORMAL HIGH (ref 70–99)

## 2013-10-15 MED ORDER — PREDNISONE 20 MG PO TABS: 40.0000 mg | ORAL_TABLET | Freq: Every day | ORAL | Status: DC

## 2013-10-15 MED ORDER — ALBUTEROL SULFATE (2.5 MG/3ML) 0.083% IN NEBU
2.5000 mg | INHALATION_SOLUTION | Freq: Once | RESPIRATORY_TRACT | Status: AC
  Administered 2013-10-15: 2.5 mg via RESPIRATORY_TRACT
  Filled 2013-10-15: qty 3

## 2013-10-15 MED ORDER — IPRATROPIUM-ALBUTEROL 0.5-2.5 (3) MG/3ML IN SOLN
3.0000 mL | Freq: Once | RESPIRATORY_TRACT | Status: AC
  Administered 2013-10-15: 3 mL via RESPIRATORY_TRACT
  Filled 2013-10-15: qty 3

## 2013-10-15 MED ORDER — PREDNISONE 50 MG PO TABS
60.0000 mg | ORAL_TABLET | Freq: Once | ORAL | Status: AC
  Administered 2013-10-15: 60 mg via ORAL
  Filled 2013-10-15 (×2): qty 1

## 2013-10-15 NOTE — Discharge Instructions (Signed)
Return as needed as discussed. As discussed your blood sugar may increase being on the steroids Chronic Obstructive Pulmonary Disease Exacerbation Chronic obstructive pulmonary disease (COPD) is a common lung condition in which airflow from the lungs is limited. COPD is a general term that can be used to describe many different lung problems that limit airflow, including chronic bronchitis and emphysema. COPD exacerbations are episodes when breathing symptoms become much worse and require extra treatment. Without treatment, COPD exacerbations can be life threatening, and frequent COPD exacerbations can cause further damage to your lungs. CAUSES   Respiratory infections.   Exposure to smoke.   Exposure to air pollution, chemical fumes, or dust. Sometimes there is no apparent cause or trigger. RISK FACTORS  Smoking cigarettes.  Older age.  Frequent prior COPD exacerbations. SIGNS AND SYMPTOMS   Increased coughing.   Increased thick spit (sputum) production.   Increased wheezing.   Increased shortness of breath.   Rapid breathing.   Chest tightness. DIAGNOSIS  Your medical history, a physical exam, and tests will help your health care provider make a diagnosis. Tests may include:  A chest X-ray.  Basic lab tests.  Sputum testing.  An arterial blood gas test. TREATMENT  Depending on the severity of your COPD exacerbation, you may need to be admitted to a hospital for treatment. Some of the treatments commonly used to treat COPD exacerbations are:   Antibiotic medicines.   Bronchodilators. These are drugs that expand the air passages. They may be given with an inhaler or nebulizer. Spacer devices may be needed to help improve drug delivery.  Corticosteroid medicines.  Supplemental oxygen therapy.  HOME CARE INSTRUCTIONS   Do not smoke. Quitting smoking is very important to prevent COPD from getting worse and exacerbations from happening as often.  Avoid  exposure to all substances that irritate the airway, especially to tobacco smoke.   If you were prescribed an antibiotic medicine, finish it all even if you start to feel better.  Take all medicines as directed by your health care provider.It is important to use correct technique with inhaled medicines.  Drink enough fluids to keep your urine clear or pale yellow (unless you have a medical condition that requires fluid restriction).  Use a cool mist vaporizer. This makes it easier to clear your chest when you cough.   If you have a home nebulizer and oxygen, continue to use them as directed.   Maintain all necessary vaccinations to prevent infections.   Exercise regularly.   Eat a healthy diet.   Keep all follow-up appointments as directed by your health care provider. SEEK IMMEDIATE MEDICAL CARE IF:  You have worsening shortness of breath.   You have trouble talking.   You have severe chest pain.  You have blood in your sputum.  You have a fever.  You have weakness, vomit repeatedly, or faint.   You feel confused.   You continue to get worse. MAKE SURE YOU:   Understand these instructions.  Will watch your condition.  Will get help right away if you are not doing well or get worse. Document Released: 11/08/2006 Document Revised: 05/28/2013 Document Reviewed: 09/15/2012 [facility] Patient Information 2015 [place], [place]. This information is not intended to replace advice given to you by your health care provider. Make sure you discuss any questions you have with your health care provider.

## 2013-10-15 NOTE — ED Provider Notes (Signed)
CSN: [id number]     Arrival date & time 10/15/13  0919 History   First MD Initiated Contact with Patient 10/15/13 [phone]     Chief Complaint  Patient presents with  . Shortness of Breath     (Consider location/radiation/quality/duration/timing/severity/associated sxs/prior Treatment) HPI Comments: Pt states that she developed cough and cold symptoms 3 days ago. She was seen 2 days ago at the urgent care and started on levaquin for uri. States that the symptoms in the last day have worsened. Low grade temperature and feels more sob. Pain in right lower ribs. Pt state that she used he her inhaler once this morning without relief  The history is provided by the patient. No language interpreter was used.    Past Medical History  Diagnosis Date  . Shingles   . Diabetes mellitus   . Hypertension   . COPD (chronic obstructive pulmonary disease)    Past Surgical History  Procedure Laterality Date  . Abdominal hysterectomy    . Cholecystectomy    . Spinal fusion     No family history on file. History  Substance Use Topics  . Smoking status: Current Every Day Smoker    Types: Cigarettes  . Smokeless tobacco: Not on file  . Alcohol Use: No   OB History   Grav Para Term Preterm Abortions TAB SAB Ect Mult Living                 Review of Systems  Constitutional: Negative.   Respiratory: Positive for shortness of breath.   Cardiovascular: Positive for chest pain.      Allergies  Codeine; Sulfa antibiotics; and Sulfur  Home Medications   Prior to Admission medications   Medication Sig Start Date End Date Taking? Authorizing Provider  albuterol (PROVENTIL) (2.5 MG/3ML) 0.083% nebulizer solution Take 2.5 mg by nebulization every 6 (six) hours as needed. Shortness of Breath   Yes Historical Provider, MD  Canagliflozin-Metformin HCl [phone] MG TABS Take 1 tablet by mouth 2 (two) times daily.   Yes Historical Provider, MD  escitalopram (LEXAPRO) 10 MG tablet Take 10 mg by mouth  daily. 08/28/12  Yes Historical Provider, MD  insulin aspart (NOVOLOG FLEXPEN) 100 UNIT/ML injection Inject 35 Units into the skin 3 (three) times daily with meals. Patient uses sliding scale based on blood glucose readings   Yes Historical Provider, MD  insulin detemir (LEVEMIR FLEXPEN) 100 UNIT/ML injection Inject 60 Units into the skin 2 (two) times daily. In addition to sliding scale instructions   Yes Historical Provider, MD  JANUVIA 100 MG tablet Take 100 mg by mouth daily. 10/16/12  Yes Historical Provider, MD  levofloxacin (LEVAQUIN) 750 MG tablet Take 750 mg by mouth daily. Starting 10/13/2013 x 10 days.   Yes Historical Provider, MD  lisinopril (PRINIVIL,ZESTRIL) 10 MG tablet Take 10 mg by mouth daily.   Yes Historical Provider, MD   BP 137/60  Pulse 92  Temp(Src) 98.4 F (36.9 C) (Oral)  Resp 20  Ht 5\' 4"  (1.626 m)  Wt 248 lb (112.492 kg)  BMI 42.55 kg/m2  SpO2 98% Physical Exam  Nursing note and vitals reviewed. Constitutional: She is oriented to person, place, and time. She appears well-developed and well-nourished.  HENT:  Right Ear: External ear normal.  Left Ear: External ear normal.  Cardiovascular: Normal rate and regular rhythm.   Pulmonary/Chest: No respiratory distress. She has wheezes. She has rales.  Abdominal: Soft. Bowel sounds are normal. There is no tenderness.  Musculoskeletal: Normal range  of motion.  Neurological: She is alert and oriented to person, place, and time.  Skin: Skin is warm and dry.    ED Course  Procedures (including critical care time) Labs Review Labs Reviewed  CBC - Abnormal; Notable for the following:    WBC 12.8 (*)    Hemoglobin 16.0 (*)    HCT 48.7 (*)    All other components within normal limits  BASIC METABOLIC PANEL - Abnormal; Notable for the following:    Glucose, Bld 176 (*)    GFR calc non Af Amer 75 (*)    GFR calc Af Amer 86 (*)    All other components within normal limits  CBG MONITORING, ED - Abnormal; Notable for  the following:    Glucose-Capillary 169 (*)    All other components within normal limits  TROPONIN I    Imaging Review Dg Chest 2 View  10/15/2013   CLINICAL DATA:  Shortness of breath, chest pain.  EXAM: CHEST  2 VIEW  COMPARISON:  10/13/2013, 02/10/2009 and CT chest 07/28/2010.  FINDINGS: Trachea is midline. Heart size normal. Right hilar prominence is unchanged from 02/10/2009. There may be minimal subsegmental atelectasis or scarring in the left lower lobe. Lungs are otherwise clear. No pleural fluid.  IMPRESSION: Possible minimal subsegmental atelectasis or scarring in the left lower lobe. No acute findings.   Electronically Signed   By: Lorin Picket M.D.   On: 10/15/2013 10:09     EKG Interpretation   Date/Time:    Ventricular Rate:  75 PR Interval:  123 QRS Duration: 78 QT Interval:  400 QTC Calculation: 447 R Axis:   85 Text Interpretation:        MDM   Final diagnoses:  COPD exacerbation    No pneumonia noted. Pt is feeling better after treatment and steroids. Pt to go home on steroids. Discussed return precautions with pt    Glendell Docker, NP 10/15/13 1206

## 2013-10-15 NOTE — ED Notes (Signed)
Sob, non productive cough x 3 days.  Seen at Urgent care this weekend, dx with URI, started on Levaquin.  Reports sob became worse last night.  Reports RLQ pain.

## 2013-10-16 NOTE — ED Provider Notes (Signed)
Medical screening examination/treatment/procedure(s) were performed by non-physician practitioner and as supervising physician I was immediately available for consultation/collaboration.   EKG Interpretation   Date/Time:  Monday October 15 2013 09:27:57 EDT Ventricular Rate:  75 PR Interval:  123 QRS Duration: 78 QT Interval:  400 QTC Calculation: 447 R Axis:   85 Text Interpretation:  Sinus rhythm Atrial premature complex Confirmed by  Maxcine Strong  MD, Kevante Lunt ([id number]) on 10/15/2013 12:05:38 PM        Maudry Diego, MD 10/16/13 1251

## 2014-06-19 ENCOUNTER — Emergency Department (HOSPITAL_COMMUNITY)
Admission: EM | Admit: 2014-06-19 | Discharge: 2014-06-19 | Disposition: A | Discharge status: Home / Self Care | Payer: BLUE CROSS/BLUE SHIELD | Attending: Emergency Medicine | Admitting: Emergency Medicine

## 2014-06-19 ENCOUNTER — Encounter (HOSPITAL_COMMUNITY): Payer: Self-pay | Admitting: Emergency Medicine

## 2014-06-19 DIAGNOSIS — J449 Chronic obstructive pulmonary disease, unspecified: Secondary | ICD-10-CM | POA: Insufficient documentation

## 2014-06-19 DIAGNOSIS — I10 Essential (primary) hypertension: Secondary | ICD-10-CM | POA: Insufficient documentation

## 2014-06-19 DIAGNOSIS — Z794 Long term (current) use of insulin: Secondary | ICD-10-CM | POA: Insufficient documentation

## 2014-06-19 DIAGNOSIS — Z79899 Other long term (current) drug therapy: Secondary | ICD-10-CM | POA: Insufficient documentation

## 2014-06-19 DIAGNOSIS — Z7982 Long term (current) use of aspirin: Secondary | ICD-10-CM | POA: Insufficient documentation

## 2014-06-19 DIAGNOSIS — M79604 Pain in right leg: Secondary | ICD-10-CM | POA: Insufficient documentation

## 2014-06-19 DIAGNOSIS — Z8619 Personal history of other infectious and parasitic diseases: Secondary | ICD-10-CM | POA: Insufficient documentation

## 2014-06-19 DIAGNOSIS — Z720 Tobacco use: Secondary | ICD-10-CM | POA: Insufficient documentation

## 2014-06-19 DIAGNOSIS — E119 Type 2 diabetes mellitus without complications: Secondary | ICD-10-CM | POA: Insufficient documentation

## 2014-06-19 MED ORDER — ENOXAPARIN SODIUM 120 MG/0.8ML ~~LOC~~ SOLN
1.0000 mg/kg | Freq: Once | SUBCUTANEOUS | Status: AC
  Administered 2014-06-19: 115 mg via SUBCUTANEOUS
  Filled 2014-06-19: qty 0.8

## 2014-06-19 NOTE — ED Notes (Signed)
MD at bedside. 

## 2014-06-19 NOTE — ED Notes (Signed)
Pt reports "knot" on right thigh x3 weeks ago. Pt denies any known injury. No deformity noted.

## 2014-06-19 NOTE — ED Notes (Signed)
Equal warmth noted in right thigh. No obvious source of pain noted upon inspection. Moderate swelling noted to right thigh.

## 2014-06-19 NOTE — ED Provider Notes (Signed)
CSN: [id number]     Arrival date & time 06/19/14  1738 History   First MD Initiated Contact with Patient 06/19/14 1758     Chief Complaint  Patient presents with  . Leg Swelling     (Consider location/radiation/quality/duration/timing/severity/associated sxs/prior Treatment) HPI...... swelling, tenderness, pain right thigh for approximately 3 weeks. No injury or trauma. No fever, sweats, chills, chest pain, dyspnea. No previous history of thrombophlebitis. Palpation and ambulation make pain worse. Severity is moderate.  Past Medical History  Diagnosis Date  . Shingles   . Diabetes mellitus   . Hypertension   . COPD (chronic obstructive pulmonary disease)    Past Surgical History  Procedure Laterality Date  . Abdominal hysterectomy    . Cholecystectomy    . Spinal fusion     History reviewed. No pertinent family history. History  Substance Use Topics  . Smoking status: Current Every Day Smoker -- 0.50 packs/day    Types: Cigarettes  . Smokeless tobacco: Not on file  . Alcohol Use: No   OB History    No data available     Review of Systems  All other systems reviewed and are negative.     Allergies  Codeine; Sulfa antibiotics; and Sulfur  Home Medications   Prior to Admission medications   Medication Sig Start Date End Date Taking? Authorizing Provider  albuterol (PROVENTIL) (2.5 MG/3ML) 0.083% nebulizer solution Take 2.5 mg by nebulization every 6 (six) hours as needed. Shortness of Breath   Yes Historical Provider, MD  aspirin EC 81 MG tablet Take 81 mg by mouth daily.   Yes Historical Provider, MD  Canagliflozin-Metformin HCl [id number] MG TABS Take 1 tablet by mouth 2 (two) times daily.   Yes Historical Provider, MD  escitalopram (LEXAPRO) 10 MG tablet Take 10 mg by mouth daily. 08/28/12  Yes Historical Provider, MD  insulin aspart (NOVOLOG FLEXPEN) 100 UNIT/ML injection Inject 35 Units into the skin 3 (three) times daily with meals. Patient uses sliding scale  based on blood glucose readings   Yes Historical Provider, MD  insulin detemir (LEVEMIR FLEXPEN) 100 UNIT/ML injection Inject 60 Units into the skin 2 (two) times daily.    Yes Historical Provider, MD  JANUVIA 100 MG tablet Take 100 mg by mouth daily. 10/16/12  Yes Historical Provider, MD  lisinopril (PRINIVIL,ZESTRIL) 10 MG tablet Take 10 mg by mouth daily.   Yes Historical Provider, MD  naproxen sodium (ANAPROX) 220 MG tablet Take 220 mg by mouth daily as needed (for pain).   Yes Historical Provider, MD  predniSONE (DELTASONE) 20 MG tablet Take 2 tablets (40 mg total) by mouth daily. Patient not taking: Reported on 06/19/2014 10/15/13   Glendell Docker, NP   BP 149/56 mmHg  Pulse 81  Temp(Src) 99.2 F (37.3 C) (Oral)  Resp 20  Ht 5\' 4"  (1.626 m)  Wt 256 lb (116.121 kg)  BMI 43.92 kg/m2  SpO2 96% Physical Exam  Constitutional: She is oriented to person, place, and time. She appears well-developed and well-nourished.  HENT:  Head: Normocephalic and atraumatic.  Eyes: Conjunctivae and EOM are normal. Pupils are equal, round, and reactive to light.  Neck: Normal range of motion. Neck supple.  Cardiovascular: Normal rate and regular rhythm.   Pulmonary/Chest: Effort normal and breath sounds normal.  Abdominal: Soft. Bowel sounds are normal.  Musculoskeletal:  Right lower extremity: Tenderness and palpable cords anterior aspect of mid thigh.   No posterior thigh or calf tenderness  Neurological: She is alert and oriented  to person, place, and time.  Skin: Skin is warm and dry.  Psychiatric: She has a normal mood and affect. Her behavior is normal.  Nursing note and vitals reviewed.   ED Course  Procedures (including critical care time) Labs Review Labs Reviewed - No data to display  Imaging Review No results found.   EKG Interpretation None      MDM   Final diagnoses:  Pain of right lower extremity   I suspect this is superficial thrombophlebitis. Will obtain Doppler  study tomorrow morning. Lovenox 1 mg/kg subcutaneous given tonight.     Nat Christen, MD 06/19/14 [phone]

## 2014-06-19 NOTE — ED Notes (Signed)
Pt alert & oriented x4, stable gait. Patient given discharge instructions, paperwork & prescription(s). Patient  instructed to stop at the registration desk to finish any additional paperwork. Patient verbalized understanding. Pt left department w/ no further questions. 

## 2014-06-19 NOTE — Discharge Instructions (Signed)
Elevate leg, warm pack, full aspirin daily, turned tomorrow for ultrasound of your right leg.

## 2014-06-20 ENCOUNTER — Ambulatory Visit (HOSPITAL_COMMUNITY)
Admit: 2014-06-20 | Discharge: 2014-06-20 | Disposition: A | Discharge status: Home / Self Care | Payer: BLUE CROSS/BLUE SHIELD | Source: Ambulatory Visit | Attending: Emergency Medicine | Admitting: Emergency Medicine

## 2014-06-20 ENCOUNTER — Other Ambulatory Visit (HOSPITAL_COMMUNITY): Payer: Self-pay | Admitting: Emergency Medicine

## 2014-06-20 DIAGNOSIS — M7989 Other specified soft tissue disorders: Secondary | ICD-10-CM | POA: Diagnosis not present

## 2014-06-20 DIAGNOSIS — M79604 Pain in right leg: Secondary | ICD-10-CM | POA: Diagnosis present

## 2014-10-09 ENCOUNTER — Other Ambulatory Visit: Payer: Self-pay | Admitting: Internal Medicine

## 2014-10-09 DIAGNOSIS — R921 Mammographic calcification found on diagnostic imaging of breast: Secondary | ICD-10-CM

## 2014-10-17 ENCOUNTER — Ambulatory Visit
Admission: RE | Admit: 2014-10-17 | Discharge: 2014-10-17 | Disposition: A | Discharge status: Home / Self Care | Payer: BLUE CROSS/BLUE SHIELD | Source: Ambulatory Visit | Attending: Internal Medicine | Admitting: Internal Medicine

## 2014-10-17 ENCOUNTER — Other Ambulatory Visit: Payer: Self-pay | Admitting: Internal Medicine

## 2014-10-17 DIAGNOSIS — R921 Mammographic calcification found on diagnostic imaging of breast: Secondary | ICD-10-CM

## 2014-11-17 ENCOUNTER — Encounter (HOSPITAL_COMMUNITY)
Admission: EM | Disposition: A | Discharge status: Home / Self Care | Payer: Self-pay | Source: Home / Self Care | Attending: Cardiovascular Disease

## 2014-11-17 ENCOUNTER — Inpatient Hospital Stay (HOSPITAL_COMMUNITY)
Admission: EM | Admit: 2014-11-17 | Discharge: 2014-11-19 | DRG: 247 | Disposition: A | Discharge status: Home / Self Care | Payer: BLUE CROSS/BLUE SHIELD | Attending: Cardiovascular Disease | Admitting: Cardiovascular Disease

## 2014-11-17 ENCOUNTER — Inpatient Hospital Stay (HOSPITAL_COMMUNITY): Payer: BLUE CROSS/BLUE SHIELD

## 2014-11-17 ENCOUNTER — Encounter (HOSPITAL_COMMUNITY): Payer: Self-pay | Admitting: Emergency Medicine

## 2014-11-17 DIAGNOSIS — Z7984 Long term (current) use of oral hypoglycemic drugs: Secondary | ICD-10-CM | POA: Diagnosis not present

## 2014-11-17 DIAGNOSIS — I2102 ST elevation (STEMI) myocardial infarction involving left anterior descending coronary artery: Secondary | ICD-10-CM | POA: Diagnosis present

## 2014-11-17 DIAGNOSIS — I2510 Atherosclerotic heart disease of native coronary artery without angina pectoris: Secondary | ICD-10-CM | POA: Diagnosis present

## 2014-11-17 DIAGNOSIS — Z23 Encounter for immunization: Secondary | ICD-10-CM

## 2014-11-17 DIAGNOSIS — F17200 Nicotine dependence, unspecified, uncomplicated: Secondary | ICD-10-CM | POA: Diagnosis present

## 2014-11-17 DIAGNOSIS — Z90710 Acquired absence of both cervix and uterus: Secondary | ICD-10-CM

## 2014-11-17 DIAGNOSIS — J449 Chronic obstructive pulmonary disease, unspecified: Secondary | ICD-10-CM | POA: Diagnosis present

## 2014-11-17 DIAGNOSIS — Z794 Long term (current) use of insulin: Secondary | ICD-10-CM | POA: Diagnosis not present

## 2014-11-17 DIAGNOSIS — Z79899 Other long term (current) drug therapy: Secondary | ICD-10-CM | POA: Diagnosis not present

## 2014-11-17 DIAGNOSIS — I219 Acute myocardial infarction, unspecified: Secondary | ICD-10-CM

## 2014-11-17 DIAGNOSIS — F17210 Nicotine dependence, cigarettes, uncomplicated: Secondary | ICD-10-CM | POA: Diagnosis present

## 2014-11-17 DIAGNOSIS — I10 Essential (primary) hypertension: Secondary | ICD-10-CM | POA: Diagnosis present

## 2014-11-17 DIAGNOSIS — I350 Nonrheumatic aortic (valve) stenosis: Secondary | ICD-10-CM

## 2014-11-17 DIAGNOSIS — E785 Hyperlipidemia, unspecified: Secondary | ICD-10-CM | POA: Diagnosis present

## 2014-11-17 DIAGNOSIS — Z981 Arthrodesis status: Secondary | ICD-10-CM | POA: Diagnosis not present

## 2014-11-17 DIAGNOSIS — R079 Chest pain, unspecified: Secondary | ICD-10-CM

## 2014-11-17 DIAGNOSIS — E782 Mixed hyperlipidemia: Secondary | ICD-10-CM | POA: Diagnosis present

## 2014-11-17 DIAGNOSIS — Z7982 Long term (current) use of aspirin: Secondary | ICD-10-CM | POA: Diagnosis not present

## 2014-11-17 DIAGNOSIS — E119 Type 2 diabetes mellitus without complications: Secondary | ICD-10-CM | POA: Diagnosis present

## 2014-11-17 DIAGNOSIS — E1159 Type 2 diabetes mellitus with other circulatory complications: Secondary | ICD-10-CM

## 2014-11-17 DIAGNOSIS — I213 ST elevation (STEMI) myocardial infarction of unspecified site: Secondary | ICD-10-CM | POA: Diagnosis present

## 2014-11-17 HISTORY — DX: Hyperlipidemia, unspecified: E78.5

## 2014-11-17 HISTORY — DX: Acute myocardial infarction, unspecified: I21.9

## 2014-11-17 HISTORY — DX: Nonrheumatic aortic (valve) stenosis: I35.0

## 2014-11-17 HISTORY — PX: CARDIAC CATHETERIZATION: SHX172

## 2014-11-17 HISTORY — DX: Atherosclerotic heart disease of native coronary artery without angina pectoris: I25.10

## 2014-11-17 LAB — COMPREHENSIVE METABOLIC PANEL
ALT: 29 U/L (ref 14–54)
ANION GAP: 9 (ref 5–15)
AST: 28 U/L (ref 15–41)
Albumin: 3.5 g/dL (ref 3.5–5.0)
Alkaline Phosphatase: 75 U/L (ref 38–126)
BILIRUBIN TOTAL: 0.4 mg/dL (ref 0.3–1.2)
BUN: 14 mg/dL (ref 6–20)
CHLORIDE: 105 mmol/L (ref 101–111)
CO2: 24 mmol/L (ref 22–32)
Calcium: 9 mg/dL (ref 8.9–10.3)
Creatinine, Ser: 0.85 mg/dL (ref 0.44–1.00)
GFR calc Af Amer: >60 mL/min (ref >60)
Glucose, Bld: 157 mg/dL — ABNORMAL HIGH (ref 65–99)
POTASSIUM: 4.3 mmol/L (ref 3.5–5.1)
Sodium: 138 mmol/L (ref 135–145)
TOTAL PROTEIN: 6.9 g/dL (ref 6.5–8.1)

## 2014-11-17 LAB — CBC WITH DIFFERENTIAL/PLATELET
BASOS ABS: 0 10*3/uL (ref 0.0–0.1)
BASOS PCT: 0 %
EOS ABS: 0.2 10*3/uL (ref 0.0–0.7)
Eosinophils Relative: 2 %
HEMATOCRIT: 47.8 % — AB (ref 36.0–46.0)
HEMOGLOBIN: 15.8 g/dL — AB (ref 12.0–15.0)
Lymphocytes Relative: 24 %
Lymphs Abs: 2.5 10*3/uL (ref 0.7–4.0)
MCH: 31.3 pg (ref 26.0–34.0)
MCHC: 33.1 g/dL (ref 30.0–36.0)
MCV: 94.8 fL (ref 78.0–100.0)
MONOS PCT: 6 %
Monocytes Absolute: 0.6 10*3/uL (ref 0.1–1.0)
NEUTROS ABS: 7 10*3/uL (ref 1.7–7.7)
NEUTROS PCT: 68 %
Platelets: 230 10*3/uL (ref 150–400)
RBC: 5.04 MIL/uL (ref 3.87–5.11)
RDW: 14.1 % (ref 11.5–15.5)
WBC: 10.3 10*3/uL (ref 4.0–10.5)

## 2014-11-17 LAB — MRSA PCR SCREENING: MRSA by PCR: POSITIVE — AB

## 2014-11-17 LAB — POCT I-STAT, CHEM 8
BUN: 18 mg/dL (ref 6–20)
Calcium, Ion: 1.06 mmol/L — ABNORMAL LOW (ref 1.12–1.23)
Chloride: 107 mmol/L (ref 101–111)
Creatinine, Ser: 0.7 mg/dL (ref 0.44–1.00)
Glucose, Bld: 157 mg/dL — ABNORMAL HIGH (ref 65–99)
HEMATOCRIT: 52 % — AB (ref 36.0–46.0)
HEMOGLOBIN: 17.7 g/dL — AB (ref 12.0–15.0)
POTASSIUM: 4.3 mmol/L (ref 3.5–5.1)
SODIUM: 140 mmol/L (ref 135–145)
TCO2: 20 mmol/L (ref 0–100)

## 2014-11-17 LAB — TROPONIN I: Troponin I: 0.91 ng/mL (ref <0.031)

## 2014-11-17 SURGERY — LEFT HEART CATH AND CORONARY ANGIOGRAPHY
Anesthesia: LOCAL

## 2014-11-17 MED ORDER — BIVALIRUDIN 250 MG IV SOLR
INTRAVENOUS | Status: AC
  Filled 2014-11-17: qty 250

## 2014-11-17 MED ORDER — ACETAMINOPHEN 325 MG PO TABS: 650.0000 mg | ORAL_TABLET | ORAL | Status: DC | PRN

## 2014-11-17 MED ORDER — VERAPAMIL HCL 2.5 MG/ML IV SOLN
INTRAVENOUS | Status: DC | PRN
  Administered 2014-11-17: 05:00:00 via INTRA_ARTERIAL

## 2014-11-17 MED ORDER — ASPIRIN EC 81 MG PO TBEC
81.0000 mg | DELAYED_RELEASE_TABLET | Freq: Every day | ORAL | Status: DC
  Administered 2014-11-17 – 2014-11-19 (×3): 81 mg via ORAL
  Filled 2014-11-17 (×3): qty 1

## 2014-11-17 MED ORDER — VERAPAMIL HCL 2.5 MG/ML IV SOLN
INTRAVENOUS | Status: AC
  Filled 2014-11-17: qty 2

## 2014-11-17 MED ORDER — LIDOCAINE HCL (PF) 1 % IJ SOLN
INTRAMUSCULAR | Status: DC | PRN
  Administered 2014-11-17: 05:00:00

## 2014-11-17 MED ORDER — ESCITALOPRAM OXALATE 10 MG PO TABS
10.0000 mg | ORAL_TABLET | Freq: Every day | ORAL | Status: DC
  Administered 2014-11-17 – 2014-11-19 (×3): 10 mg via ORAL
  Filled 2014-11-17 (×3): qty 1

## 2014-11-17 MED ORDER — SODIUM CHLORIDE 0.9 % IV SOLN
250.0000 mg | INTRAVENOUS | Status: DC | PRN
Start: 2014-11-17 — End: 2014-11-17
  Administered 2014-11-17: 1.75 mg/kg/h via INTRAVENOUS

## 2014-11-17 MED ORDER — TICAGRELOR 90 MG PO TABS
ORAL_TABLET | ORAL | Status: DC | PRN
  Administered 2014-11-17: 180 mg via ORAL

## 2014-11-17 MED ORDER — NITROGLYCERIN 1 MG/10 ML FOR IR/CATH LAB
INTRA_ARTERIAL | Status: AC
  Filled 2014-11-17: qty 10

## 2014-11-17 MED ORDER — HEPARIN (PORCINE) IN NACL 2-0.9 UNIT/ML-% IJ SOLN
INTRAMUSCULAR | Status: AC
  Filled 2014-11-17: qty 1500

## 2014-11-17 MED ORDER — CANAGLIFLOZIN 300 MG PO TABS
300.0000 mg | ORAL_TABLET | Freq: Every day | ORAL | Status: DC
  Filled 2014-11-17 (×2): qty 300

## 2014-11-17 MED ORDER — SODIUM CHLORIDE 0.9 % IV SOLN: 250.0000 mL | INTRAVENOUS | Status: DC | PRN

## 2014-11-17 MED ORDER — HEPARIN SODIUM (PORCINE) 5000 UNIT/ML IJ SOLN
4000.0000 [IU] | Freq: Once | INTRAMUSCULAR | Status: AC
  Administered 2014-11-17: 4000 [IU] via INTRAVENOUS

## 2014-11-17 MED ORDER — SODIUM CHLORIDE 0.9 % IV SOLN
1.7500 mg/kg/h | INTRAVENOUS | Status: AC
  Filled 2014-11-17: qty 250

## 2014-11-17 MED ORDER — SODIUM CHLORIDE 0.9 % IJ SOLN
3.0000 mL | Freq: Two times a day (BID) | INTRAMUSCULAR | Status: DC
  Administered 2014-11-17 – 2014-11-18 (×4): 3 mL via INTRAVENOUS

## 2014-11-17 MED ORDER — PNEUMOCOCCAL VAC POLYVALENT 25 MCG/0.5ML IJ INJ
0.5000 mL | INJECTION | INTRAMUSCULAR | Status: AC
  Administered 2014-11-18: 0.5 mL via INTRAMUSCULAR

## 2014-11-17 MED ORDER — ONDANSETRON HCL 4 MG/2ML IJ SOLN
4.0000 mg | Freq: Four times a day (QID) | INTRAMUSCULAR | Status: DC | PRN
  Administered 2014-11-17: 4 mg via INTRAVENOUS
  Filled 2014-11-17: qty 2

## 2014-11-17 MED ORDER — TICAGRELOR 90 MG PO TABS
90.0000 mg | ORAL_TABLET | Freq: Two times a day (BID) | ORAL | Status: DC
  Administered 2014-11-17 – 2014-11-19 (×4): 90 mg via ORAL
  Filled 2014-11-17 (×5): qty 1

## 2014-11-17 MED ORDER — SODIUM CHLORIDE 0.9 % IV SOLN: INTRAVENOUS | Status: AC

## 2014-11-17 MED ORDER — ATORVASTATIN CALCIUM 80 MG PO TABS
80.0000 mg | ORAL_TABLET | Freq: Every day | ORAL | Status: DC
  Administered 2014-11-17 – 2014-11-18 (×2): 80 mg via ORAL
  Filled 2014-11-17 (×2): qty 1

## 2014-11-17 MED ORDER — LIDOCAINE HCL (PF) 1 % IJ SOLN
INTRAMUSCULAR | Status: AC
  Filled 2014-11-17: qty 30

## 2014-11-17 MED ORDER — INSULIN DETEMIR 100 UNIT/ML ~~LOC~~ SOLN
60.0000 [IU] | Freq: Two times a day (BID) | SUBCUTANEOUS | Status: DC
  Administered 2014-11-17 – 2014-11-19 (×5): 60 [IU] via SUBCUTANEOUS
  Filled 2014-11-17 (×8): qty 0.6

## 2014-11-17 MED ORDER — IOHEXOL 350 MG/ML SOLN
INTRAVENOUS | Status: DC | PRN
  Administered 2014-11-17: 170 mL via INTRA_ARTERIAL

## 2014-11-17 MED ORDER — SODIUM CHLORIDE 0.9 % IV SOLN
INTRAVENOUS | Status: DC | PRN
  Administered 2014-11-17: 100 mL/h via INTRAVENOUS
  Administered 2014-11-17: 250 mL

## 2014-11-17 MED ORDER — CANAGLIFLOZIN 300 MG PO TABS
300.0000 mg | ORAL_TABLET | Freq: Every day | ORAL | Status: DC
  Administered 2014-11-18 – 2014-11-19 (×2): 300 mg via ORAL
  Filled 2014-11-17 (×3): qty 300

## 2014-11-17 MED ORDER — SODIUM CHLORIDE 0.9 % IJ SOLN: 3.0000 mL | INTRAMUSCULAR | Status: DC | PRN

## 2014-11-17 MED ORDER — LISINOPRIL 10 MG PO TABS
10.0000 mg | ORAL_TABLET | Freq: Every day | ORAL | Status: DC
  Administered 2014-11-18 – 2014-11-19 (×2): 10 mg via ORAL
  Filled 2014-11-17 (×2): qty 1

## 2014-11-17 MED ORDER — TICAGRELOR 90 MG PO TABS
ORAL_TABLET | ORAL | Status: AC
  Filled 2014-11-17: qty 2

## 2014-11-17 MED ORDER — CARVEDILOL 3.125 MG PO TABS
3.1250 mg | ORAL_TABLET | Freq: Two times a day (BID) | ORAL | Status: DC
  Administered 2014-11-17 – 2014-11-19 (×4): 3.125 mg via ORAL
  Filled 2014-11-17 (×4): qty 1

## 2014-11-17 MED ORDER — LINAGLIPTIN 5 MG PO TABS
5.0000 mg | ORAL_TABLET | Freq: Every day | ORAL | Status: DC
  Administered 2014-11-17 – 2014-11-19 (×3): 5 mg via ORAL
  Filled 2014-11-17 (×4): qty 1

## 2014-11-17 MED ORDER — BIVALIRUDIN BOLUS VIA INFUSION - CUPID
INTRAVENOUS | Status: DC | PRN
  Administered 2014-11-17: 87.075 mg via INTRAVENOUS

## 2014-11-17 MED ORDER — NITROGLYCERIN 1 MG/10 ML FOR IR/CATH LAB
INTRA_ARTERIAL | Status: DC | PRN
  Administered 2014-11-17: 100 ug via INTRACORONARY
  Administered 2014-11-17: 200 ug via INTRACORONARY

## 2014-11-17 SURGICAL SUPPLY — 16 items
BALLN EMERGE MR 2.0X15 (BALLOONS) ×2
BALLOON EMERGE MR 2.0X15 (BALLOONS) ×1 IMPLANT
CATH HEARTRAIL 6F IL3.5 (CATHETERS) ×2 IMPLANT
CATH INFINITI 5FR ANG PIGTAIL (CATHETERS) ×2 IMPLANT
DEVICE RAD COMP TR BAND LRG (VASCULAR PRODUCTS) ×2 IMPLANT
GLIDESHEATH SLEND SS 6F .021 (SHEATH) ×2 IMPLANT
HOVERMATT SINGLE USE (MISCELLANEOUS) ×2 IMPLANT
KIT ENCORE 26 ADVANTAGE (KITS) ×2 IMPLANT
KIT HEART LEFT (KITS) ×2 IMPLANT
PACK CARDIAC CATHETERIZATION (CUSTOM PROCEDURE TRAY) ×2 IMPLANT
STENT PROMUS PREM MR 2.25X20 (Permanent Stent) ×2 IMPLANT
SYR MEDRAD MARK V 150ML (SYRINGE) ×2 IMPLANT
TRANSDUCER W/STOPCOCK (MISCELLANEOUS) ×2 IMPLANT
TUBING CIL FLEX 10 FLL-RA (TUBING) ×2 IMPLANT
WIRE RUNTHROUGH .014X180CM (WIRE) ×2 IMPLANT
WIRE SAFE-T 1.5MM-J .035X260CM (WIRE) ×2 IMPLANT

## 2014-11-17 NOTE — ED Notes (Signed)
Transported to cath lab with Dr. Fletcher Anon .

## 2014-11-17 NOTE — Progress Notes (Signed)
  Echocardiogram 2D Echocardiogram has been performed.  [name] 11/17/2014, 1:55 PM

## 2014-11-17 NOTE — H&P (Signed)
History and Physical  Patient ID: [name] MRN: [id number] DOB/AGE: 03-Jan-1956 59 y.o. Admit date: 11/17/2014  Primary Care Physician: Monico Blitz, MD Primary Cardiologist : new  HPI:   This is a pleasant 58 year old female with no previous cardiac history. She has known history of diabetes mellitus, obesity, hypertension, tobacco use and COPD. She presented with acute onset of chest pain which woke her up from sleep around 3:00 this morning. She called EMS. EKG showed anterior ST elevation and some inferior ST elevation. Thus, she was brought as a code STEMI. She was still having significant chest pain on presentation. She was given aspirin and heparin.  Review of systems complete and found to be negative unless listed above  Past Medical History  Diagnosis Date  . Shingles   . Diabetes mellitus   . Hypertension   . COPD (chronic obstructive pulmonary disease) (HCC)      family history:  Remarkable for coronary artery disease. Social History   Social History  . Marital Status: Married    Spouse Name: N/A  . Number of Children: N/A  . Years of Education: N/A   Occupational History  . Not on file.   Social History Main Topics  . Smoking status: Current Every Day Smoker -- 0.50 packs/day    Types: Cigarettes  . Smokeless tobacco: Not on file  . Alcohol Use: No  . Drug Use: No  . Sexual Activity: Not on file   Other Topics Concern  . Not on file   Social History Narrative    Past Surgical History  Procedure Laterality Date  . Abdominal hysterectomy    . Cholecystectomy    . Spinal fusion       Prescriptions prior to admission  Medication Sig Dispense Refill Last Dose  . albuterol (PROVENTIL) (2.5 MG/3ML) 0.083% nebulizer solution Take 2.5 mg by nebulization every 6 (six) hours as needed. Shortness of Breath   past month  . aspirin EC 81 MG tablet Take 81 mg by mouth daily.   11/16/2014  . canagliflozin (INVOKANA) 300 MG TABS tablet Take 300 mg by mouth  daily before breakfast.   11/16/2014  . escitalopram (LEXAPRO) 10 MG tablet Take 10 mg by mouth daily.   11/16/14  . JANUVIA 100 MG tablet Take 100 mg by mouth daily.   11/16/14  . lisinopril (PRINIVIL,ZESTRIL) 10 MG tablet Take 10 mg by mouth daily.   11/16/14  . metFORMIN (GLUCOPHAGE) 1000 MG tablet Take 1,000 mg by mouth 2 (two) times daily with a meal.   11/16/2014  . naproxen sodium (ANAPROX) 220 MG tablet Take 220 mg by mouth daily as needed (for pain).   past week  . insulin aspart (NOVOLOG FLEXPEN) 100 UNIT/ML injection Inject 35 Units into the skin 3 (three) times daily with meals. Patient uses sliding scale based on blood glucose readings   06/19/2014 at Unknown time  . insulin detemir (LEVEMIR FLEXPEN) 100 UNIT/ML injection Inject 60 Units into the skin 2 (two) times daily.    06/19/2014 at Unknown time  . [DISCONTINUED] Canagliflozin-Metformin HCl [phone] MG TABS Take 1 tablet by mouth 2 (two) times daily.   06/18/2014 at Unknown time  . [DISCONTINUED] predniSONE (DELTASONE) 20 MG tablet Take 2 tablets (40 mg total) by mouth daily. (Patient not taking: Reported on 06/19/2014) 10 tablet 0     Physical Exam: Blood pressure 98/54, pulse 59, temperature 99 F (37.2 C), temperature source Oral, resp. rate 19, height 5\' 4"  (1.626 m), weight 255 lb  15.3 oz (116.1 kg), SpO2 94 %.   Constitutional: She is oriented to person, place, and time. She appears well-developed and well-nourished. No distress.  HENT: No nasal discharge.  Head: Normocephalic and atraumatic.  Eyes: Pupils are equal and round. No discharge.  Neck: Normal range of motion. Neck supple. No JVD present. No thyromegaly present.  Cardiovascular: Normal rate, regular rhythm, normal heart sounds. Exam reveals no gallop and no friction rub. No murmur heard.  Pulmonary/Chest: Effort normal and breath sounds normal. No stridor. No respiratory distress. She has no wheezes. She has no rales. She exhibits no tenderness.  Abdominal:  Soft. Bowel sounds are normal. She exhibits no distension. There is no tenderness. There is no rebound and no guarding.  Musculoskeletal: Normal range of motion. She exhibits no edema and no tenderness.  Neurological: She is alert and oriented to person, place, and time. Coordination normal.  Skin: Skin is warm and dry. No rash noted. She is not diaphoretic. No erythema. No pallor.  Psychiatric: She has a normal mood and affect. Her behavior is normal. Judgment and thought content normal.    Labs:   Lab Results  Component Value Date   WBC 10.3 11/17/2014   HGB 17.7* 11/17/2014   HCT 52.0* 11/17/2014   MCV 94.8 11/17/2014   PLT 230 11/17/2014    Recent Labs Lab 11/17/14 0419 11/17/14 0425  NA 138 140  K 4.3 4.3  CL 105 107  CO2 24  --   BUN 14 18  CREATININE 0.85 0.70  CALCIUM 9.0  --   PROT 6.9  --   BILITOT 0.4  --   ALKPHOS 75  --   ALT 29  --   AST 28  --   GLUCOSE 157* 157*   Lab Results  Component Value Date   TROPONINI <0.03 11/17/2014   No results found for: CHOL No results found for: HDL No results found for: LDLCALC No results found for: TRIG No results found for: CHOLHDL No results found for: LDLDIRECT    Radiology: No results found.  EKG:   Normal sinus rhythm with 1-2 mm of ST elevation from V3 to V6 with minor ST elevation in the inferior leads.  ASSESSMENT AND PLAN:   1. Anterior ST elevation myocardial infarction:  The patient was given aspirin and unfractionated heparin. I discussed the options with the patient and recommended proceeding with emergent cardiac catheterization and possible coronary intervention.  Further recommendations to follow after cardiac catheterization.   2. Diabetes mellitus: Continue home insulin.  3. Tobacco use: she will need to be counseled on smoking cessation.  Signed: Kathlyn Sacramento MD, [facility] 11/17/2014, 5:33 AM

## 2014-11-17 NOTE — ED Provider Notes (Signed)
CSN: [id number]     Arrival date & time 11/17/14  0410 History   First MD Initiated Contact with Patient 11/17/14 [phone]     Chief Complaint  Patient presents with  . Code STEMI     (Consider location/radiation/quality/duration/timing/severity/associated sxs/prior Treatment) The history is provided by the patient.   59 year old female was awakened at about 3 AM with severe left sided chest pain. Pain is sharp and she rated at 8/10. There is associated dyspnea, nausea, diaphoresis. She did vomit once. She's never had symptoms like this before. Nothing made it better nothing made it worse. EMS was called and administered aspirin and nitroglycerin. She continues to have significant pain. She does have cardiac risk factors of tobacco abuse, diabetes, positive family history of coronary artery disease. There is no history of hyperlipidemia. She denied history of hypertension but her hospital record does suggest history of hypertension and she is on lisinopril.  Past Medical History  Diagnosis Date  . Shingles   . Diabetes mellitus   . Hypertension   . COPD (chronic obstructive pulmonary disease) [facility])    Past Surgical History  Procedure Laterality Date  . Abdominal hysterectomy    . Cholecystectomy    . Spinal fusion     No family history on file. Social History  Substance Use Topics  . Smoking status: Current Every Day Smoker -- 0.50 packs/day    Types: Cigarettes  . Smokeless tobacco: None  . Alcohol Use: No   OB History    No data available     Review of Systems  All other systems reviewed and are negative.     Allergies  Codeine; Sulfa antibiotics; and Sulfur  Home Medications   Prior to Admission medications   Medication Sig Start Date End Date Taking? Authorizing Provider  albuterol (PROVENTIL) (2.5 MG/3ML) 0.083% nebulizer solution Take 2.5 mg by nebulization every 6 (six) hours as needed. Shortness of Breath    Historical Provider, MD  aspirin EC 81 MG tablet  Take 81 mg by mouth daily.    Historical Provider, MD  Canagliflozin-Metformin HCl [phone] MG TABS Take 1 tablet by mouth 2 (two) times daily.    Historical Provider, MD  escitalopram (LEXAPRO) 10 MG tablet Take 10 mg by mouth daily. 08/28/12   Historical Provider, MD  insulin aspart (NOVOLOG FLEXPEN) 100 UNIT/ML injection Inject 35 Units into the skin 3 (three) times daily with meals. Patient uses sliding scale based on blood glucose readings    Historical Provider, MD  insulin detemir (LEVEMIR FLEXPEN) 100 UNIT/ML injection Inject 60 Units into the skin 2 (two) times daily.     Historical Provider, MD  JANUVIA 100 MG tablet Take 100 mg by mouth daily. 10/16/12   Historical Provider, MD  lisinopril (PRINIVIL,ZESTRIL) 10 MG tablet Take 10 mg by mouth daily.    Historical Provider, MD  naproxen sodium (ANAPROX) 220 MG tablet Take 220 mg by mouth daily as needed (for pain).    Historical Provider, MD  predniSONE (DELTASONE) 20 MG tablet Take 2 tablets (40 mg total) by mouth daily. Patient not taking: Reported on 06/19/2014 10/15/13   Glendell Docker, NP   BP 98/59 mmHg  Pulse 65  Temp(Src) 98.2 F (36.8 C) (Oral)  Resp 18  Ht 5\' 4"  (1.626 m)  Wt 255 lb 15.3 oz (116.1 kg)  BMI 43.91 kg/m2  SpO2 97% Physical Exam  Nursing note and vitals reviewed.  59 year old female, who appears uncomfortable, but his in no acute distress.  Vital signs are normal. Oxygen saturation is 97%, which is normal. Head is normocephalic and atraumatic. PERRLA, EOMI. Oropharynx is clear. Neck is nontender and supple without adenopathy or JVD. Back is nontender and there is no CVA tenderness. Lungs are clear without rales, wheezes, or rhonchi. Chest is nontender. Heart has regular rate and rhythm without murmur. Abdomen is soft, flat, nontender without masses or hepatosplenomegaly and peristalsis is normoactive. Extremities have no cyanosis or edema, full range of motion is present. Skin is warm and dry without  rash. Neurologic: Mental status is normal, cranial nerves are intact, there are no motor or sensory deficits.  ED Course  Procedures (including critical care time) Labs Review Labs Reviewed - No data to display  I have personally reviewed and evaluated these lab results as part of my medical decision-making.   EKG Interpretation   Date/Time:  Sunday November 17 2014 04:17:58 EDT Ventricular Rate:  69 PR Interval:  147 QRS Duration: 78 QT Interval:  442 QTC Calculation: 473 R Axis:   88 Text Interpretation:  Sinus rhythm Anterior infarct, possibly acute ST  elevation, consider inferior injury ** ** ACUTE MI / STEMI ** ** Inferior  and anterolateral wall When compared with ECG of 10/15/2013, ** ** ACUTE MI  / STEMI ** ** is now Present Confirmed by [facility]  MD, Dolores Ewing ([id number]) on  11/17/2014 4:21:41 AM      CRITICAL CARE Performed by: MHDQQ,IWLNL Total critical care time: 35 minutes Critical care time was exclusive of separately billable procedures and treating other patients. Critical care was necessary to treat or prevent imminent or life-threatening deterioration. Critical care was time spent personally by me on the following activities: development of treatment plan with patient and/or surrogate as well as nursing, discussions with consultants, evaluation of patient's response to treatment, examination of patient, obtaining history from patient or surrogate, ordering and performing treatments and interventions, ordering and review of laboratory studies, ordering and review of radiographic studies, pulse oximetry and re-evaluation of patient's condition. MDM   Final diagnoses:  ST elevation myocardial infarction (STEMI), unspecified artery ([place])    STEMI. ECG had been shown to be prior to patient's arrival in the ED and code STEMI activated prior to her arrival. In the ED, she is given heparin. Dr. Fletcher Anon of cardiology service is here seeing the patient to take her to the  catheterization lab. Old records are reviewed and she has no relevant past visits. History of COPD is noted.     Delora Fuel, MD 89/21/19 4174

## 2014-11-17 NOTE — Progress Notes (Signed)
Utilization review completed.  

## 2014-11-17 NOTE — ED Notes (Signed)
Pt. arrived with EMS from home woke up this morning with left chest pain  " sharp" , SOB , diaphoresis and emesis x1 , received 4 mg. IV morphine by EMS and 324 mg. ASA prior to arrival . Dr. Fletcher Anon ( cardiologist ) evaluated pt. at arrival .

## 2014-11-18 ENCOUNTER — Encounter (HOSPITAL_COMMUNITY): Payer: Self-pay | Admitting: Cardiovascular Disease

## 2014-11-18 DIAGNOSIS — I2102 ST elevation (STEMI) myocardial infarction involving left anterior descending coronary artery: Principal | ICD-10-CM

## 2014-11-18 LAB — CBC
HCT: 44.5 % (ref 36.0–46.0)
Hemoglobin: 14.2 g/dL (ref 12.0–15.0)
MCH: 30.3 pg (ref 26.0–34.0)
MCHC: 31.9 g/dL (ref 30.0–36.0)
MCV: 95.1 fL (ref 78.0–100.0)
PLATELETS: 241 10*3/uL (ref 150–400)
RBC: 4.68 MIL/uL (ref 3.87–5.11)
RDW: 14.1 % (ref 11.5–15.5)
WBC: 8.5 10*3/uL (ref 4.0–10.5)

## 2014-11-18 LAB — HEPATIC FUNCTION PANEL
ALT: 28 U/L (ref 14–54)
AST: 22 U/L (ref 15–41)
Albumin: 3.1 g/dL — ABNORMAL LOW (ref 3.5–5.0)
Alkaline Phosphatase: 66 U/L (ref 38–126)
BILIRUBIN DIRECT: 0.1 mg/dL (ref 0.1–0.5)
BILIRUBIN INDIRECT: 0.5 mg/dL (ref 0.3–0.9)
TOTAL PROTEIN: 6.2 g/dL — AB (ref 6.5–8.1)
Total Bilirubin: 0.6 mg/dL (ref 0.3–1.2)

## 2014-11-18 LAB — BASIC METABOLIC PANEL
Anion gap: 7 (ref 5–15)
BUN: 18 mg/dL (ref 6–20)
CALCIUM: 9 mg/dL (ref 8.9–10.3)
CHLORIDE: 110 mmol/L (ref 101–111)
CO2: 24 mmol/L (ref 22–32)
CREATININE: 0.78 mg/dL (ref 0.44–1.00)
GFR calc non Af Amer: >60 mL/min (ref >60)
Glucose, Bld: 144 mg/dL — ABNORMAL HIGH (ref 65–99)
Potassium: 4.3 mmol/L (ref 3.5–5.1)
SODIUM: 141 mmol/L (ref 135–145)

## 2014-11-18 LAB — GLUCOSE, CAPILLARY: Glucose-Capillary: 209 mg/dL — ABNORMAL HIGH (ref 65–99)

## 2014-11-18 LAB — LIPID PANEL
CHOL/HDL RATIO: 3.9 ratio
Cholesterol: 108 mg/dL (ref 0–200)
HDL: 28 mg/dL — AB (ref >40)
LDL CALC: 52 mg/dL (ref 0–99)
TRIGLYCERIDES: 142 mg/dL (ref <150)
VLDL: 28 mg/dL (ref 0–40)

## 2014-11-18 LAB — POCT ACTIVATED CLOTTING TIME: Activated Clotting Time: 853 seconds

## 2014-11-18 NOTE — Progress Notes (Signed)
Patient ID: [name], female   DOB: 01/16/1956, 59 y.o.   MRN: [id number]    Subjective:  Denies SSCP, palpitations or Dyspnea   Objective:  Filed Vitals:   11/17/14 2008 11/17/14 2340 11/18/14 0355 11/18/14 0725  BP: 98/59 108/72 131/82 143/56  Pulse:      Temp: 98.2 F (36.8 C) 97.7 F (36.5 C) 98 F (36.7 C) 98.2 F (36.8 C)  TempSrc: Oral Oral Oral Oral  Resp: 18 17 17 20   Height:      Weight:      SpO2: 97% 97% 96% 97%    Intake/Output from previous day:  Intake/Output Summary (Last 24 hours) at 11/18/14 3716 Last data filed at 11/17/14 2052  Gross per 24 hour  Intake    495 ml  Output      0 ml  Net    495 ml    Physical Exam: Affect appropriate Overweight white female  HEENT: normal Neck supple with no adenopathy JVP normal no bruits no thyromegaly Lungs clear with no wheezing and good diaphragmatic motion Heart:  S1/S2 no murmur, no rub, gallop or click PMI normal Abdomen: benighn, BS positve, no tenderness, no AAA no bruit.  No HSM or HJR Distal pulses intact with no bruits No edema Neuro non-focal Skin warm and dry No muscular weakness   Lab Results: Basic Metabolic Panel:  Recent Labs  11/17/14 0419 11/17/14 0425  NA 138 140  K 4.3 4.3  CL 105 107  CO2 24  --   GLUCOSE 157* 157*  BUN 14 18  CREATININE 0.85 0.70  CALCIUM 9.0  --    Liver Function Tests:  Recent Labs  11/17/14 0419  AST 28  ALT 29  ALKPHOS 75  BILITOT 0.4  PROT 6.9  ALBUMIN 3.5   CBC:  Recent Labs  11/17/14 0419 11/17/14 0425  WBC 10.3  --   NEUTROABS 7.0  --   HGB 15.8* 17.7*  HCT 47.8* 52.0*  MCV 94.8  --   PLT 230  --    Cardiac Enzymes:  Recent Labs  11/17/14 0419 11/17/14 1115  TROPONINI <0.03 0.91*    Imaging: No results found.  Cardiac Studies:  ECG:    Telemetry:  SR no arrhythmia  Echo: reviewed normal EF 60%  Mild AS   Medications:   . aspirin EC  81 mg Oral Daily  . atorvastatin  80 mg Oral q1800  . canagliflozin   300 mg Oral QAC breakfast  . carvedilol  3.125 mg Oral BID WC  . escitalopram  10 mg Oral Daily  . insulin detemir  60 Units Subcutaneous BID  . linagliptin  5 mg Oral Daily  . lisinopril  10 mg Oral Daily  . pneumococcal 23 valent vaccine  0.5 mL Intramuscular Tomorrow-1000  . sodium chloride  3 mL Intravenous Q12H  . ticagrelor  90 mg Oral BID       Assessment/Plan:  MI:  Distal LAD occlusion post DES  Continue DAT and coreg.  Outpatient f/u with SM in [place] he took care of her husband Chol:  On statin Mild AS  F/u echo in a year Smoking:  Discussed cessation DM  On linagliptin f/u primary   D/C in am transfer to floor  Jenkins Rouge 11/18/2014, 8:37 AM

## 2014-11-18 NOTE — Progress Notes (Signed)
CARDIAC REHAB PHASE I   PRE:  Rate/Rhythm: 64 SR  BP:  Sitting: 124/46        SaO2: 96 RA  MODE:  Ambulation: 350 ft   POST:  Rate/Rhythm: 99 SR  BP:  Sitting: 147/58         SaO2: 98 RA  Pt ambulated 350 ft on RA, independent, steady gait, tolerated well.  Pt denies DOE, cp, dizziness, declined rest stop. Completed MI/stent education.  Reviewed risk factors, MI book, tobacco cessation, anti-platelet therapy, stent card, activity restrictions, ntg, exercise, heart healthy diet, carb counting, and phase 2 cardiac rehab. Pt verbalized understanding. Pt receptive to education, states she has been under a lot of stress, her husband died two years ago. Pt interested in phase 2 cardiac rehab, will send referral to [place].  Pt to recliner after walk, call bell within reach.   [phone]   Lenna Sciara, RN, BSN 11/18/2014 10:32 AM

## 2014-11-18 NOTE — Care Management Note (Signed)
Case Management Note  Patient Details  Name: [name] MRN: [id number] Date of Birth: 1955-11-11  Subjective/Objective:     Adm w mi               Action/Plan: lives w fam, pcp dr Manuella Ghazi   Expected Discharge Date:                  Expected Discharge Plan:  Home/Self Care  In-House Referral:     Discharge planning Services  CM Consult, Medication Assistance  Post Acute Care Choice:    Choice offered to:     DME Arranged:    DME Agency:     HH Arranged:    [place] Agency:     Status of Service:     Medicare Important Message Given:    Date Medicare IM Given:    Medicare IM give by:    Date Additional Medicare IM Given:    Additional Medicare Important Message give by:     If discussed at [place] of Stay Meetings, dates discussed:    Additional Comments:gave pt 30day free and copay assist card for brilinta. Has bcbs ins  [name] 11/18/2014, 9:25 AM

## 2014-11-19 ENCOUNTER — Encounter (HOSPITAL_COMMUNITY): Payer: Self-pay | Admitting: Physician Assistant

## 2014-11-19 DIAGNOSIS — E1159 Type 2 diabetes mellitus with other circulatory complications: Secondary | ICD-10-CM

## 2014-11-19 DIAGNOSIS — J449 Chronic obstructive pulmonary disease, unspecified: Secondary | ICD-10-CM | POA: Diagnosis present

## 2014-11-19 DIAGNOSIS — I10 Essential (primary) hypertension: Secondary | ICD-10-CM | POA: Diagnosis present

## 2014-11-19 DIAGNOSIS — F17210 Nicotine dependence, cigarettes, uncomplicated: Secondary | ICD-10-CM | POA: Diagnosis present

## 2014-11-19 DIAGNOSIS — F17200 Nicotine dependence, unspecified, uncomplicated: Secondary | ICD-10-CM | POA: Diagnosis present

## 2014-11-19 DIAGNOSIS — I2510 Atherosclerotic heart disease of native coronary artery without angina pectoris: Secondary | ICD-10-CM | POA: Diagnosis present

## 2014-11-19 DIAGNOSIS — I350 Nonrheumatic aortic (valve) stenosis: Secondary | ICD-10-CM

## 2014-11-19 DIAGNOSIS — E782 Mixed hyperlipidemia: Secondary | ICD-10-CM | POA: Diagnosis present

## 2014-11-19 DIAGNOSIS — E785 Hyperlipidemia, unspecified: Secondary | ICD-10-CM | POA: Diagnosis present

## 2014-11-19 LAB — GLUCOSE, CAPILLARY: GLUCOSE-CAPILLARY: 98 mg/dL (ref 65–99)

## 2014-11-19 MED ORDER — ATORVASTATIN CALCIUM 80 MG PO TABS: 80.0000 mg | ORAL_TABLET | Freq: Every day | ORAL | Status: DC

## 2014-11-19 MED ORDER — TICAGRELOR 90 MG PO TABS: 90.0000 mg | ORAL_TABLET | Freq: Two times a day (BID) | ORAL | Status: DC

## 2014-11-19 MED ORDER — MUPIROCIN 2 % EX OINT
1.0000 "application " | TOPICAL_OINTMENT | Freq: Two times a day (BID) | CUTANEOUS | Status: DC
  Administered 2014-11-19: 1 via NASAL
  Filled 2014-11-19: qty 22

## 2014-11-19 MED ORDER — CARVEDILOL 3.125 MG PO TABS: 3.1250 mg | ORAL_TABLET | Freq: Two times a day (BID) | ORAL | Status: DC

## 2014-11-19 NOTE — Progress Notes (Signed)
Patient ID: [name], female   DOB: 1955/11/27, 59 y.o.   MRN: [id number]    Subjective:  Denies SSCP, palpitations or Dyspnea Ready to go home   Objective:  Filed Vitals:   11/18/14 1643 11/18/14 1932 11/18/14 2327 11/19/14 0323  BP: 145/75 131/53 106/45 122/53  Pulse:    63  Temp: 97.8 F (36.6 C) 98.5 F (36.9 C) 98.2 F (36.8 C) 98.2 F (36.8 C)  TempSrc: Oral Oral Oral Oral  Resp: 18 25 19 17   Height:      Weight:      SpO2: 97% 92%  92%    Intake/Output from previous day:  Intake/Output Summary (Last 24 hours) at 11/19/14 0805 Last data filed at 11/18/14 2225  Gross per 24 hour  Intake    603 ml  Output      0 ml  Net    603 ml    Physical Exam: Affect appropriate Overweight white female  HEENT: normal Neck supple with no adenopathy JVP normal no bruits no thyromegaly Lungs clear with no wheezing and good diaphragmatic motion Heart:  S1/S2 no murmur, no rub, gallop or click PMI normal Abdomen: benighn, BS positve, no tenderness, no AAA no bruit.  No HSM or HJR Distal pulses intact with no bruits No edema Neuro non-focal Skin warm and dry No muscular weakness   Lab Results: Basic Metabolic Panel:  Recent Labs  11/17/14 0419 11/17/14 0425 11/18/14 0252  NA 138 140 141  K 4.3 4.3 4.3  CL 105 107 110  CO2 24  --  24  GLUCOSE 157* 157* 144*  BUN 14 18 18   CREATININE 0.85 0.70 0.78  CALCIUM 9.0  --  9.0   Liver Function Tests:  Recent Labs  11/17/14 0419 11/18/14 0252  AST 28 22  ALT 29 28  ALKPHOS 75 66  BILITOT 0.4 0.6  PROT 6.9 6.2*  ALBUMIN 3.5 3.1*   CBC:  Recent Labs  11/17/14 0419 11/17/14 0425 11/18/14 0252  WBC 10.3  --  8.5  NEUTROABS 7.0  --   --   HGB 15.8* 17.7* 14.2  HCT 47.8* 52.0* 44.5  MCV 94.8  --  95.1  PLT 230  --  241   Cardiac Enzymes:  Recent Labs  11/17/14 0419 11/17/14 1115  TROPONINI <0.03 0.91*    Imaging: No results found.  Cardiac Studies:  ECG:    Telemetry:  SR no  arrhythmia  Echo: reviewed normal EF 60%  Mild AS   Medications:   . aspirin EC  81 mg Oral Daily  . atorvastatin  80 mg Oral q1800  . canagliflozin  300 mg Oral QAC breakfast  . carvedilol  3.125 mg Oral BID WC  . escitalopram  10 mg Oral Daily  . insulin detemir  60 Units Subcutaneous BID  . linagliptin  5 mg Oral Daily  . lisinopril  10 mg Oral Daily  . sodium chloride  3 mL Intravenous Q12H  . ticagrelor  90 mg Oral BID       Assessment/Plan:  MI:  Distal LAD occlusion post DES  Continue DAT and coreg.  Outpatient f/u with SM in [place] he took care of her husband Chol:  On statin Mild AS  F/u echo in a year Smoking:  Discussed cessation DM  On linagliptin f/u primary   D/C home F/U [facility] in [place] 11/19/2014, 8:05 AM

## 2014-11-19 NOTE — Progress Notes (Signed)
Pt given scripts and d/c instructions. Verbalizes understanding. PIV d/c'd catheter tip intact. D/c to private vehicle via WC.

## 2014-11-19 NOTE — Discharge Summary (Signed)
Discharge Summary   Patient ID: [name] MRN: [id number], DOB/AGE: [birth year] 59 y.o. Admit date: 11/17/2014 D/C date:     11/19/2014  Primary Cardiologist: Dr. Domenic Polite (per patient request as he takes care of her husband)  Principal Problem:   ST elevation (STEMI) myocardial infarction involving left anterior descending coronary artery ([place]) Active Problems:   CAD (coronary artery disease)   COPD (chronic obstructive pulmonary disease) ([place])   Hypertension   DM type 2 (diabetes mellitus, type 2) ([place])   Tobacco abuse   HLD (hyperlipidemia)   Aortic stenosis    Admission Dates: 11/17/14-11/19/14 Discharge Diagnosis: Anterior STEMI s/p DES to dLAD  HPI: [name] is a 59 y.o. female with a history of DM, obesity, HTN, tobacco abuse, COPD and no past cardiac history who presented to [facility] on 11/17/14 as a CODE STEMI and taken back for emergent cardiac catheterization.   She presented with acute onset of chest pain which woke her up from sleep around 3:00 am. She called EMS. EKG showed anterior ST elevation and some inferior ST elevation. Thus, she was brought as a code STEMI. She was still having significant chest pain on presentation. She was given aspirin and heparin.  Hospital Course  Anterior STEMI -- s/p LHC on 11/17/14 which revealed  1. Severe one-vessel coronary artery disease with an occluded distal LAD.  2. Normal LV systolic function with apical akinesis.  3. Successful angioplasty and DES placement to the distal LAD. -- 2D ECHO with EF 60-65%, no RWMA, mild AS and PA pk pressure 35 mm Hg. -- Continue DAPT with ASA and Brilinta for at least one year, BB and statin. Aggressive treatment of risk factors.   Tobacco abuse- smoking cessation is strongly advised.   HLD-LDL at goal: 52. Continue statin  Mild AS- noted on 2D ECHO. F/u echo in a year  DM- on linagliptin f/u primary    The patient has had an uncomplicated hospital course and is recovering  well. The radial catheter site is stable. She has been seen by Dr. Johnsie Cancel today and deemed ready for discharge home. All follow-up appointments have been scheduled. Smoking cessation was disscussed in length. A written RX for a 30 day free supply of Brilinta was provided for the patient. Discharge medications are listed below.   Discharge Vitals: Blood pressure 128/45, pulse 68, temperature 97.8 F (36.6 C), temperature source Oral, resp. rate 14, height '5\' 4"'  (1.626 m), weight 255 lb 15.3 oz (116.1 kg), SpO2 97 %.  Labs: Lab Results  Component Value Date   WBC 8.5 11/18/2014   HGB 14.2 11/18/2014   HCT 44.5 11/18/2014   MCV 95.1 11/18/2014   PLT 241 11/18/2014     Recent Labs Lab 11/18/14 0252  NA 141  K 4.3  CL 110  CO2 24  BUN 18  CREATININE 0.78  CALCIUM 9.0  PROT 6.2*  BILITOT 0.6  ALKPHOS 66  ALT 28  AST 22  GLUCOSE 144*    Recent Labs  11/17/14 0419 11/17/14 1115  TROPONINI <0.03 0.91*   Lab Results  Component Value Date   CHOL 108 11/18/2014   HDL 28* 11/18/2014   LDLCALC 52 11/18/2014   TRIG 142 11/18/2014    Diagnostic Studies/Procedures    LHC 11/17/14 Conclusion     Prox Cx to Mid Cx lesion, 20% stenosed.  Mid Cx lesion, 30% stenosed.  Dist LAD lesion, 100% stenosed. Post intervention, there is a 0% residual stenosis.  Mid RCA lesion,  30% stenosed.  The left ventricular systolic function is normal.  1. Severe one-vessel coronary artery disease with an occluded distal LAD. 2. Normal LV systolic function with apical akinesis. 3. Successful angioplasty and drug-eluting stent placement to the distal LAD.  Recommendations: Dual antiplatelet therapy for at least one year. Aggressive treatment of risk factors. Smoking cessation is strongly advised.     Indications    ST elevation (STEMI) myocardial infarction involving left anterior descending coronary artery (HCC) [I21.02 (ICD-10-CM)]           Coronary Findings     Dominance: Right   Left Main  The vessel is angiographically normal.     Left Anterior Descending   . Dist LAD lesion, 100% stenosed. The lesion is type non-C thrombotic.   [name] PCI: There is no pre-interventional antegrade distal flow (TIMI 0). Pre-stent angioplasty was performed. A drug-eluting stent was placed. The strut is apposed. No post-stent angioplasty was performed. Maximum pressure: 16 atm. The post-interventional distal flow is normal (TIMI 3). The intervention was successful. No complications occurred at this lesion.  . Supplies used: STENT PROMUS PREM MR [id number]  . There is no residual stenosis post intervention.     . First Diagonal Branch   The vessel exhibits minimal luminal irregularities.   . Second Diagonal Branch   The vessel is small in size and exhibits minimal luminal irregularities.   . Third Diagonal Branch   The vessel is small in size and exhibits minimal luminal irregularities.     Left Circumflex   . Prox Cx to Mid Cx lesion, 20% stenosed.   . Mid Cx lesion, 30% stenosed.   . First Obtuse Marginal Branch   The vessel is small in size and exhibits minimal luminal irregularities.   . Second Obtuse Marginal Branch   The vessel is small in size and exhibits minimal luminal irregularities.   . Third Obtuse Marginal Branch   The vessel is angiographically normal.     Right Coronary Artery   . Mid RCA lesion, 30% stenosed.   . Right Posterior Descending Artery   The vessel is angiographically normal.   . Right Posterior Atrioventricular Branch   The vessel is angiographically normal.   . First Right Posterolateral   The vessel is angiographically normal.   . Second Right Posterolateral   The vessel is angiographically normal.   . Third Right Posterolateral   The vessel is small in size and is angiographically normal.      Wall Motion                 Left Heart    Left Ventricle The left ventricular size is normal. The left ventricular  systolic function is normal. The left ventricular ejection fraction is 55-65% by visual estimate. There are wall motion abnormalities in the left ventricle. There are segmental wall motion abnormalities in the left ventricle.   Mitral Valve There is no mitral valve regurgitation.   Aortic Valve There is no aortic valve stenosis.    Coronary Diagrams    Diagnostic Diagram           Post-Intervention Diagram            Implants    Name ID Temporary Type Supply   Valere Dross MR 1.19E17 - [id number] No Permanent Stent STENT PROMUS PREM MR 2.25X20    PACS Images    Show images for Cardiac catheterization     Link to Procedure Log  Procedure Log      Hemo Data       Most Recent Value   AO Systolic Pressure  82 mmHg   AO Diastolic Pressure  34 mmHg   AO Mean  53 mmHg   LV Systolic Pressure  696 mmHg   LV Diastolic Pressure  10 mmHg   LV EDP  22 mmHg   Arterial Occlusion Pressure Extended Systolic Pressure  93 mmHg   Arterial Occlusion Pressure Extended Diastolic Pressure  46 mmHg   Arterial Occlusion Pressure Extended Mean Pressure  67 mmHg   Left Ventricular Apex Extended Systolic Pressure  295 mmHg   Left Ventricular Apex Extended Diastolic Pressure  14 mmHg   Left Ventricular Apex Extended EDP Pressure  31 mmHg     2D ECHO: 11/17/2014 LV EF: 60- 65% Study Conclusions - Left ventricle: The cavity size was normal. Wall thickness was normal. Systolic function was normal. The estimated ejection fraction was in the range of 60% to 65%. Wall motion was normal; there were no regional wall motion abnormalities. Left ventricular diastolic function parameters were normal. - Aortic valve: There was very mild stenosis. Valve area (VTI): 2.05 cm^2. Valve area (Vmax): 1.89 cm^2. Valve area (Vmean): 1.91 cm^2. - Pulmonary arteries: Systolic pressure was mildly increased. PA peak pressure: 35 mm Hg (S).    Discharge  Medications     Medication List    STOP taking these medications        naproxen sodium 220 MG tablet  Commonly known as:  ANAPROX      TAKE these medications        albuterol (2.5 MG/3ML) 0.083% nebulizer solution  Commonly known as:  PROVENTIL  Take 2.5 mg by nebulization every 6 (six) hours as needed. Shortness of Breath     aspirin EC 81 MG tablet  Take 81 mg by mouth daily.     atorvastatin 80 MG tablet  Commonly known as:  LIPITOR  Take 1 tablet (80 mg total) by mouth daily at 6 PM.     carvedilol 3.125 MG tablet  Commonly known as:  COREG  Take 1 tablet (3.125 mg total) by mouth 2 (two) times daily with a meal.     escitalopram 10 MG tablet  Commonly known as:  LEXAPRO  Take 10 mg by mouth daily.     INVOKAMET [phone] MG Tabs  Generic drug:  Canagliflozin-Metformin HCl  Take 1 tablet by mouth 2 (two) times daily.     JANUVIA 100 MG tablet  Generic drug:  sitaGLIPtin  Take 100 mg by mouth daily.     LEVEMIR FLEXPEN 100 UNIT/ML injection  Generic drug:  insulin detemir  Inject 60 Units into the skin 2 (two) times daily.     lisinopril 10 MG tablet  Commonly known as:  PRINIVIL,ZESTRIL  Take 10 mg by mouth daily.     NOVOLOG FLEXPEN 100 UNIT/ML injection  Generic drug:  insulin aspart  Inject 20-35 Units into the skin 3 (three) times daily with meals. 35 units in the morning, 20 units at lunch and 35 units in the evening     ticagrelor 90 MG Tabs tablet  Commonly known as:  BRILINTA  Take 1 tablet (90 mg total) by mouth 2 (two) times daily.        Disposition   The patient will be discharged in stable condition to home. Discharge Instructions    Amb Referral to Cardiac Rehabilitation    Complete by:  As directed  Diagnosis:   PCI Myocardial Infarction            Follow-up Information    Follow up with Jory Sims, NP On 11/26/2014.   Specialties:  Nurse Practitioner, Radiology, Cardiology   Why:  @ 2:10pm   Contact information:    [place] [phone]         Duration of Discharge Encounter: Greater than 30 minutes including physician and PA time.  Mable Fill R PA-C 11/19/2014, 10:44 AM

## 2014-11-19 NOTE — Discharge Instructions (Signed)

## 2014-11-26 ENCOUNTER — Ambulatory Visit (INDEPENDENT_AMBULATORY_CARE_PROVIDER_SITE_OTHER): Payer: BLUE CROSS/BLUE SHIELD | Admitting: Adult Health

## 2014-11-26 ENCOUNTER — Encounter: Payer: Self-pay | Admitting: Adult Health

## 2014-11-26 VITALS — BP 126/60 | HR 80 | Ht 64.0 in | Wt 254.0 lb

## 2014-11-26 DIAGNOSIS — I2510 Atherosclerotic heart disease of native coronary artery without angina pectoris: Secondary | ICD-10-CM

## 2014-11-26 DIAGNOSIS — I10 Essential (primary) hypertension: Secondary | ICD-10-CM

## 2014-11-26 DIAGNOSIS — Z720 Tobacco use: Secondary | ICD-10-CM

## 2014-11-26 DIAGNOSIS — E785 Hyperlipidemia, unspecified: Secondary | ICD-10-CM | POA: Diagnosis not present

## 2014-11-26 MED ORDER — CARVEDILOL 3.125 MG PO TABS: 3.1250 mg | ORAL_TABLET | Freq: Two times a day (BID) | ORAL | Status: DC

## 2014-11-26 MED ORDER — LISINOPRIL 10 MG PO TABS: 10.0000 mg | ORAL_TABLET | Freq: Every day | ORAL | Status: DC

## 2014-11-26 MED ORDER — ATORVASTATIN CALCIUM 80 MG PO TABS: 80.0000 mg | ORAL_TABLET | Freq: Every day | ORAL | Status: DC

## 2014-11-26 MED ORDER — TICAGRELOR 90 MG PO TABS: 90.0000 mg | ORAL_TABLET | Freq: Two times a day (BID) | ORAL | Status: DC

## 2014-11-26 NOTE — Progress Notes (Signed)
Cardiology Office Note   Date:  11/26/2014   ID:  Anala Whisenant, DOB 26-Feb-1955, MRN [id number]  PCP:  Monico Blitz, MD  Cardiologist: Est with Dr. Woodroe Chen, NP   Chief Complaint  Patient presents with  . Coronary Artery Disease    S/P Mi with DES to LAD      History of Present Illness: [name] is a 59 y.o. female who presents for post hospital follow up after admission to [facility] hospital in the setting of STEMI of the LAD. Other history includes diabetes, HTN, tobacco abuse, and COPD. Emergent cardiac cath demonstrated:  Anterior STEMI -- s/p LHC on 11/17/14 which revealed 1. Severe one-vessel coronary artery disease with an occluded distal LAD. 2. Normal LV systolic function with apical akinesis. 3. Successful angioplasty and DES placement to the distal LAD. -- 2D ECHO with EF 60-65%, no RWMA, mild AS and PA pk pressure 35 mm Hg. -- Continue DAPT with ASA and Brilinta for at least one year, BB and statin. Aggressive treatment of risk factors.    She states she feels much better, has more energy and is pain free. She unfortunately continues to smoke although she is trying to quit. She has not been seen by cardiac rehab yet.   Past Medical History  Diagnosis Date  . Shingles   . Diabetes mellitus   . Hypertension   . COPD (chronic obstructive pulmonary disease) ([place])   . CAD (coronary artery disease)     a. 10/2014: Ant STEMI s/p DES to dLAD  . Tobacco abuse   . HLD (hyperlipidemia)   . Aortic stenosis     a. mild by 2D ECHO 11/17/14: . Valve area (VTI):    Past Surgical History  Procedure Laterality Date  . Abdominal hysterectomy    . Cholecystectomy    . Spinal fusion    . Cardiac catheterization N/A 11/17/2014    Procedure: Left Heart Cath and Coronary Angiography;  Surgeon: Wellington Hampshire, MD;  Location: [facility];  Service: Cardiovascular;  Laterality: N/A;  . Cardiac catheterization N/A  11/17/2014    Procedure: Coronary Stent Intervention;  Surgeon: Wellington Hampshire, MD;  Location: [facility];  Service: Cardiovascular;  Laterality: N/A;  distal lad 2.25x20 promus     Current Outpatient Prescriptions  Medication Sig Dispense Refill  . albuterol (PROVENTIL) (2.5 MG/3ML) 0.083% nebulizer solution Take 2.5 mg by nebulization every 6 (six) hours as needed. Shortness of Breath    . aspirin EC 81 MG tablet Take 81 mg by mouth daily.    [name] atorvastatin (LIPITOR) 80 MG tablet Take 1 tablet (80 mg total) by mouth daily at 6 PM. 90 tablet 3  . Canagliflozin-Metformin HCl (INVOKAMET) [phone] MG TABS Take 1 tablet by mouth 2 (two) times daily.    . carvedilol (COREG) 3.125 MG tablet Take 1 tablet (3.125 mg total) by mouth 2 (two) times daily with a meal. 180 tablet 3  . escitalopram (LEXAPRO) 10 MG tablet Take 10 mg by mouth daily.    . insulin aspart (NOVOLOG FLEXPEN) 100 UNIT/ML injection Inject 20-35 Units into the skin 3 (three) times daily with meals. 35 units in the morning, 20 units at lunch and 35 units in the evening    . insulin detemir (LEVEMIR FLEXPEN) 100 UNIT/ML injection Inject 60 Units into the skin 2 (two) times daily.     [name] JANUVIA 100 MG tablet Take 100 mg by mouth daily.    [name] lisinopril (  PRINIVIL,ZESTRIL) 10 MG tablet Take 1 tablet (10 mg total) by mouth daily. 90 tablet 3  . ticagrelor (BRILINTA) 90 MG TABS tablet Take 1 tablet (90 mg total) by mouth 2 (two) times daily. 180 tablet 3   No current facility-administered medications for this visit.    Allergies:   Codeine; Sulfa antibiotics; and Sulfur    Social History:  The patient  reports that she has been smoking Cigarettes.  She has been smoking about 0.50 packs per day. She does not have any smokeless tobacco history on file. She reports that she does not drink alcohol or use illicit drugs.   Family History:  The patient's family history includes Heart attack in her father; Heart disease in her father.     ROS: All other systems are reviewed and negative. Unless otherwise mentioned in H&P    PHYSICAL EXAM: VS:  BP 126/60 mmHg  Pulse 80  Ht 5\' 4"  (1.626 m)  Wt 254 lb (115.214 kg)  BMI 43.58 kg/m2  SpO2 95% , BMI Body mass index is 43.58 kg/(m^2). GEN: Well nourished, well developed, in no acute distressMorbidly obese.  HEENT: normal Neck: no JVD, carotid bruits, or masses Cardiac: RRR; 1/6 systolic murmur radiating into the carotids bilaterally,  Respiratory:  clear to auscultation bilaterally, normal work of breathing GI: soft, nontender, nondistended, + BS MS: no deformity or atrophy Skin: warm and dry, no rash Neuro:  Strength and sensation are intact Psych: euthymic mood, full affect Recent Labs: 11/18/2014: ALT 28; BUN 18; Creatinine, Ser 0.78; Hemoglobin 14.2; Platelets 241; Potassium 4.3; Sodium 141    Lipid Panel    Component Value Date/Time   CHOL 108 11/18/2014 0252   TRIG 142 11/18/2014 0252   HDL 28* 11/18/2014 0252   CHOLHDL 3.9 11/18/2014 0252   VLDL 28 11/18/2014 0252   LDLCALC 52 11/18/2014 0252      Wt Readings from Last 3 Encounters:  11/26/14 254 lb (115.214 kg)  11/17/14 255 lb 15.3 oz (116.1 kg)  06/19/14 256 lb (116.121 kg)     ASSESSMENT AND PLAN:  1.CAD: S/P STEMI of the LAD. S/P DES to the distal LAD. She is feeling better and has had no recurrent chest pain. She is adherent to her medications and is without complaints of bleeding or fatigue. She is to be established with Dr. Bronson Ing on next visit. She will remain on DAPT, statin and BB.   2.Ongoing tobacco abuse: Smoking cessation counseling is provided. She is ready to quit and is cutting down but has not stopped yet. I have encouraged cessation with her CAD. She verbalizes understanding.   3. Hypertension: Good control. She is on ACE.   4. Hypercholesterolemia: She is on statin. Will need follow up lipids and LFTS in 3 months before next appointment.    Current medicines are  reviewed at length with the patient today.    Labs/ tests ordered today include: Lipids and LFTS in 3 months.   Orders Placed This Encounter  Procedures  . Lipid panel  . Hepatic function panel     Disposition:   FU with  3 months with Dr. Bronson Ing    Signed, Jory Sims, NP  11/26/2014 3:45 PM    [facility] [address] [place], [place] [address] Phone: [phone]; Fax: [phone]

## 2014-11-26 NOTE — Patient Instructions (Signed)
Your physician wants you to follow-up in: 3 Months with Dr. Domenic Polite. You will receive a reminder letter in the mail two months in advance. If you don't receive a letter, please call our office to schedule the follow-up appointment.  Your physician recommends that you return for lab work in: 3 months(Fasting).  Your physician recommends that you continue on your current medications as directed. Please refer to the Current Medication list given to you today.  If you need a refill on your cardiac medications before your next appointment, please call your pharmacy.  Thank you for choosing [facility]!

## 2014-11-26 NOTE — Progress Notes (Deleted)
Name: [name]    DOB: 09-19-1955  Age: 59 y.o.  MR#: [id number]       PCP:  Monico Blitz, MD      Insurance: Payor: BLUE CROSS BLUE SHIELD / Plan: BCBS OTHER / Product Type: *No Product type* /   CC:   No chief complaint on file.   VS Filed Vitals:   11/26/14 1415  BP: 126/60  Pulse: 80  Height: 5\' 4"  (1.626 m)  Weight: 254 lb (115.214 kg)  SpO2: 95%    Weights Current Weight  11/26/14 254 lb (115.214 kg)  11/17/14 255 lb 15.3 oz (116.1 kg)  06/19/14 256 lb (116.121 kg)    Blood Pressure  BP Readings from Last 3 Encounters:  11/26/14 126/60  11/19/14 128/45  06/19/14 141/53     Admit date:  (Not on file) Last encounter with RMR:  Visit date not found   Allergy Codeine; Sulfa antibiotics; and Sulfur  Current Outpatient Prescriptions  Medication Sig Dispense Refill  . albuterol (PROVENTIL) (2.5 MG/3ML) 0.083% nebulizer solution Take 2.5 mg by nebulization every 6 (six) hours as needed. Shortness of Breath    . aspirin EC 81 MG tablet Take 81 mg by mouth daily.    [name] atorvastatin (LIPITOR) 80 MG tablet Take 1 tablet (80 mg total) by mouth daily at 6 PM. 30 tablet 6  . Canagliflozin-Metformin HCl (INVOKAMET) [phone] MG TABS Take 1 tablet by mouth 2 (two) times daily.    . carvedilol (COREG) 3.125 MG tablet Take 1 tablet (3.125 mg total) by mouth 2 (two) times daily with a meal. 60 tablet 6  . escitalopram (LEXAPRO) 10 MG tablet Take 10 mg by mouth daily.    . insulin aspart (NOVOLOG FLEXPEN) 100 UNIT/ML injection Inject 20-35 Units into the skin 3 (three) times daily with meals. 35 units in the morning, 20 units at lunch and 35 units in the evening    . insulin detemir (LEVEMIR FLEXPEN) 100 UNIT/ML injection Inject 60 Units into the skin 2 (two) times daily.     [name] JANUVIA 100 MG tablet Take 100 mg by mouth daily.    [name] lisinopril (PRINIVIL,ZESTRIL) 10 MG tablet Take 10 mg by mouth daily.    . ticagrelor (BRILINTA) 90 MG TABS tablet Take 1 tablet (90 mg total) by mouth 2  (two) times daily. 60 tablet 11   No current facility-administered medications for this visit.    Discontinued Meds:   There are no discontinued medications.  Patient Active Problem List   Diagnosis Date Noted  . CAD (coronary artery disease)   . COPD (chronic obstructive pulmonary disease) ([place])   . Hypertension   . DM type 2 (diabetes mellitus, type 2) ([place])   . Tobacco abuse   . HLD (hyperlipidemia)   . Aortic stenosis   . ST elevation (STEMI) myocardial infarction involving left anterior descending coronary artery (HCC) 11/17/2014    LABS    Component Value Date/Time   NA 141 11/18/2014 0252   NA 140 11/17/2014 0425   NA 138 11/17/2014 0419   K 4.3 11/18/2014 0252   K 4.3 11/17/2014 0425   K 4.3 11/17/2014 0419   CL 110 11/18/2014 0252   CL 107 11/17/2014 0425   CL 105 11/17/2014 0419   CO2 24 11/18/2014 0252   CO2 24 11/17/2014 0419   CO2 27 10/15/2013 1023   GLUCOSE 144* 11/18/2014 0252   GLUCOSE 157* 11/17/2014 0425   GLUCOSE 157* 11/17/2014 0419   BUN 18  11/18/2014 0252   BUN 18 11/17/2014 0425   BUN 14 11/17/2014 0419   CREATININE 0.78 11/18/2014 0252   CREATININE 0.70 11/17/2014 0425   CREATININE 0.85 11/17/2014 0419   CALCIUM 9.0 11/18/2014 0252   CALCIUM 9.0 11/17/2014 0419   CALCIUM 9.3 10/15/2013 1023   GFRNONAA >60 11/18/2014 0252   GFRNONAA >60 11/17/2014 0419   GFRNONAA 75* 10/15/2013 1023   GFRAA >60 11/18/2014 0252   GFRAA >60 11/17/2014 0419   GFRAA 86* 10/15/2013 1023   CMP     Component Value Date/Time   NA 141 11/18/2014 0252   K 4.3 11/18/2014 0252   CL 110 11/18/2014 0252   CO2 24 11/18/2014 0252   GLUCOSE 144* 11/18/2014 0252   BUN 18 11/18/2014 0252   CREATININE 0.78 11/18/2014 0252   CALCIUM 9.0 11/18/2014 0252   PROT 6.2* 11/18/2014 0252   ALBUMIN 3.1* 11/18/2014 0252   AST 22 11/18/2014 0252   ALT 28 11/18/2014 0252   ALKPHOS 66 11/18/2014 0252   BILITOT 0.6 11/18/2014 0252   GFRNONAA >60 11/18/2014 0252   GFRAA >60  11/18/2014 0252       Component Value Date/Time   WBC 8.5 11/18/2014 0252   WBC 10.3 11/17/2014 0419   WBC 12.8* 10/15/2013 1023   HGB 14.2 11/18/2014 0252   HGB 17.7* 11/17/2014 0425   HGB 15.8* 11/17/2014 0419   HCT 44.5 11/18/2014 0252   HCT 52.0* 11/17/2014 0425   HCT 47.8* 11/17/2014 0419   MCV 95.1 11/18/2014 0252   MCV 94.8 11/17/2014 0419   MCV 95.5 10/15/2013 1023    Lipid Panel     Component Value Date/Time   CHOL 108 11/18/2014 0252   TRIG 142 11/18/2014 0252   HDL 28* 11/18/2014 0252   CHOLHDL 3.9 11/18/2014 0252   VLDL 28 11/18/2014 0252   LDLCALC 52 11/18/2014 0252    ABG    Component Value Date/Time   TCO2 20 11/17/2014 0425     No results found for: TSH BNP (last 3 results) No results for input(s): BNP in the last 8760 hours.  ProBNP (last 3 results) No results for input(s): PROBNP in the last 8760 hours.  Cardiac Panel (last 3 results) No results for input(s): CKTOTAL, CKMB, TROPONINI, RELINDX in the last 72 hours.  Iron/TIBC/Ferritin/ %Sat No results found for: IRON, TIBC, FERRITIN, IRONPCTSAT   EKG Orders placed or performed during the hospital encounter of 11/17/14  . EKG 12-Lead  . EKG 12-Lead  . EKG 12-Lead immediately post procedure  . EKG 12-Lead  . EKG 12-Lead immediately post procedure  . EKG 12-Lead     Prior Assessment and Plan Problem List as of 11/26/2014      Cardiovascular and Mediastinum   ST elevation (STEMI) myocardial infarction involving left anterior descending coronary artery (HCC)   CAD (coronary artery disease)   Hypertension   Aortic stenosis     Respiratory   COPD (chronic obstructive pulmonary disease) ([place])     Endocrine   DM type 2 (diabetes mellitus, type 2) ([place])     Other   Tobacco abuse   HLD (hyperlipidemia)       Imaging: No results found.

## 2014-12-02 ENCOUNTER — Telehealth: Payer: Self-pay | Admitting: Adult Health

## 2014-12-02 DIAGNOSIS — R197 Diarrhea, unspecified: Secondary | ICD-10-CM

## 2014-12-02 NOTE — Telephone Encounter (Signed)
Coreg was prescribed on discharge from the hospital and not by me. She can change it to lopressor 25 mg BID to see if the changes helps her symptoms. She will need to have BMET to evaluate potassium with diarrhea and if it continues she may need to see PCP for stool sample. For now, try the medication change to lopressor.

## 2014-12-02 NOTE — Telephone Encounter (Signed)
Pt started taking Coreg on 11/26/14.Since 11/29/14 she states everything she eats or drinks causes her diarrhea.No fever,abdominal pain or lack of appetite   Will forward to Ms.Purcell Nails NP

## 2014-12-02 NOTE — Telephone Encounter (Signed)
Patient states she is not tolerating new medicine prescribed by Curt Bears. Did not know name of med. / tg

## 2014-12-03 NOTE — Telephone Encounter (Signed)
Called pt, no answer- [id number]

## 2014-12-04 ENCOUNTER — Encounter (HOSPITAL_COMMUNITY)
Admission: RE | Admit: 2014-12-04 | Discharge: 2014-12-04 | Disposition: A | Discharge status: Home / Self Care | Payer: BLUE CROSS/BLUE SHIELD | Source: Ambulatory Visit | Attending: Cardiology | Admitting: Cardiology

## 2014-12-04 VITALS — BP 102/50 | HR 79 | Ht 64.0 in | Wt 254.3 lb

## 2014-12-04 DIAGNOSIS — Z955 Presence of coronary angioplasty implant and graft: Secondary | ICD-10-CM

## 2014-12-04 DIAGNOSIS — I213 ST elevation (STEMI) myocardial infarction of unspecified site: Secondary | ICD-10-CM | POA: Diagnosis not present

## 2014-12-04 DIAGNOSIS — I2510 Atherosclerotic heart disease of native coronary artery without angina pectoris: Secondary | ICD-10-CM | POA: Insufficient documentation

## 2014-12-04 MED ORDER — METOPROLOL TARTRATE 25 MG PO TABS: 25.0000 mg | ORAL_TABLET | Freq: Two times a day (BID) | ORAL | Status: DC

## 2014-12-04 NOTE — Progress Notes (Signed)
Patient arrived for 1st visit/orientation/education at 1310. Patient was referred to CR by Dr. Domenic Polite due to MI(l21.3). During orientation advised patient on arrival and appointment times what to wear, what to do before, during and after exercise. Reviewed attendance and class policy. Talked about inclement weather and class consultation policy. Pt is scheduled to return Cardiac Rehab on December 09, 2014 at 1100. Pt was advised to come to class 5 minutes before class starts. He was also given instructions on meeting with the dietician and attending the Family Structure classes.  Patients entrance PHQ9 score was 2 which did not require any counseling.  Pt is eager to get started. Patient was able to complete 6 minute walk test. Patient was measured for the equipment. Discussed equipment safety with patient. Took patient pre-anthropometric measurements. Patient finished visit at 1430.

## 2014-12-04 NOTE — Telephone Encounter (Signed)
I spoke with pt,she will stop Coreg and try lopressor 25 mg BID and get BMET at [facility] lab

## 2014-12-04 NOTE — Progress Notes (Signed)
Cardiac/Pulmonary Rehab Medication Review by a Pharmacist  Does the patient  feel that his/her medications are working for him/her?  yes  Has the patient been experiencing any side effects to the medications prescribed?  yes  Does the patient measure his/her own blood pressure or blood glucose at home?  yes   Does the patient have any problems obtaining medications due to transportation or finances?   no  Understanding of regimen: excellent Understanding of indications: good Potential of compliance: excellent  Questions asked to Determine Patient Understanding of Medication Regimen:  1. What is the name of the medication?  2. What is the medication used for?  3. When should it be taken?  4. How much should be taken?  5. How will you take it?  6. What side effects should you report?  Understanding Defined as: Excellent: All questions above are correct Good: Questions 1-4 are correct Fair: Questions 1-2 are correct  Poor: 1 or none of the above questions are correct   Pharmacist comments: Patient has had some nausea and diarrhea with addition of new medications: lipitor, coreg, brilinta. As a result, MD changed coreg to metoprolol to see if that will help. Patient continues to smoke but has cut back and we discussed options to help stopping. Patient is monitoring blood pressure and blood sugars. Understands regimen and indications and is compliant.  Thanks for allowing me to assist with the care of this patient.  Isac Sarna, BS Pharm D, BCPS Clinical Pharmacist Pager [phone] 12/04/2014 1:32 PM

## 2014-12-04 NOTE — Patient Instructions (Signed)
Pt has finished orientation and is scheduled to return to CR on December 09, 2014 at 1100. Pt has been instructed to arrive to class 15 minutes early for scheduled class. Pt has been instructed to wear comfortable clothing and shoes with rubber soles. Pt has been told to take their medications 1 hour prior to coming to class.  If the patient is not going to attend class, she has been instructed to call.

## 2014-12-05 LAB — BASIC METABOLIC PANEL
BUN: 18 mg/dL (ref 7–25)
CHLORIDE: 104 mmol/L (ref 98–110)
CO2: 22 mmol/L (ref 20–31)
CREATININE: 0.73 mg/dL (ref 0.50–1.05)
Calcium: 9.1 mg/dL (ref 8.6–10.4)
GLUCOSE: 155 mg/dL — AB (ref 65–99)
Potassium: 4.5 mmol/L (ref 3.5–5.3)
Sodium: 139 mmol/L (ref 135–146)

## 2014-12-09 ENCOUNTER — Encounter (HOSPITAL_COMMUNITY)
Admission: RE | Admit: 2014-12-09 | Discharge: 2014-12-09 | Disposition: A | Discharge status: Home / Self Care | Payer: BLUE CROSS/BLUE SHIELD | Source: Ambulatory Visit | Attending: Cardiology | Admitting: Cardiology

## 2014-12-09 DIAGNOSIS — I213 ST elevation (STEMI) myocardial infarction of unspecified site: Secondary | ICD-10-CM | POA: Diagnosis not present

## 2014-12-11 ENCOUNTER — Encounter (HOSPITAL_COMMUNITY)
Admission: RE | Admit: 2014-12-11 | Discharge: 2014-12-11 | Disposition: A | Discharge status: Home / Self Care | Payer: BLUE CROSS/BLUE SHIELD | Source: Ambulatory Visit | Attending: Cardiology | Admitting: Cardiology

## 2014-12-11 DIAGNOSIS — I213 ST elevation (STEMI) myocardial infarction of unspecified site: Secondary | ICD-10-CM | POA: Diagnosis not present

## 2014-12-11 NOTE — Progress Notes (Signed)
Cardiac Rehabilitation Program Outcomes Report   Orientation:  12/04/14 Graduate Date:  tbd Discharge Date:  tbd # of sessions completed: 3  Cardiologist: Grant Ruts MD:  Jannifer Franklin Time:  1100  A.  Exercise Program:  Tolerates exercise @ 2.20 METS for 15 minutes and Walk Test Results:  Pre: 2.38  B.  Mental Health:  Good mental attitude and PHQ-9: 2  C.  Education/Instruction/Skills  Accurately checks own pulse.  Rest:  88  Exercise:  91  Uses Perceived Exertion Scale and/or Dyspnea Scale  D.  Nutrition/Weight Control/Body Composition:  Adherence to prescribed nutrition program: fair     E.  Blood Lipids    Lab Results  Component Value Date   CHOL 108 11/18/2014   HDL 28* 11/18/2014   LDLCALC 52 11/18/2014   TRIG 142 11/18/2014   CHOLHDL 3.9 11/18/2014    F.  Lifestyle Changes:  Continues to smoke and Needs physician encouragement on making lifestyle changes  G.  Symptoms noted with exercise:  Asymptomatic  Report Completed By:  Stevphen Rochester RN   Comments:  This is the patients first week progress note for [facility].

## 2014-12-13 ENCOUNTER — Encounter (HOSPITAL_COMMUNITY)
Admission: RE | Admit: 2014-12-13 | Discharge: 2014-12-13 | Disposition: A | Discharge status: Home / Self Care | Payer: BLUE CROSS/BLUE SHIELD | Source: Ambulatory Visit | Attending: Cardiology | Admitting: Cardiology

## 2014-12-13 DIAGNOSIS — I213 ST elevation (STEMI) myocardial infarction of unspecified site: Secondary | ICD-10-CM | POA: Diagnosis not present

## 2014-12-16 ENCOUNTER — Encounter (HOSPITAL_COMMUNITY)
Admission: RE | Admit: 2014-12-16 | Discharge: 2014-12-16 | Disposition: A | Discharge status: Home / Self Care | Payer: BLUE CROSS/BLUE SHIELD | Source: Ambulatory Visit | Attending: Cardiology | Admitting: Cardiology

## 2014-12-16 DIAGNOSIS — I213 ST elevation (STEMI) myocardial infarction of unspecified site: Secondary | ICD-10-CM | POA: Diagnosis not present

## 2014-12-18 ENCOUNTER — Encounter (HOSPITAL_COMMUNITY)
Admission: RE | Admit: 2014-12-18 | Discharge: 2014-12-18 | Disposition: A | Discharge status: Home / Self Care | Payer: BLUE CROSS/BLUE SHIELD | Source: Ambulatory Visit | Attending: Cardiology | Admitting: Cardiology

## 2014-12-18 DIAGNOSIS — I213 ST elevation (STEMI) myocardial infarction of unspecified site: Secondary | ICD-10-CM | POA: Diagnosis not present

## 2014-12-20 ENCOUNTER — Encounter (HOSPITAL_COMMUNITY): Payer: BLUE CROSS/BLUE SHIELD

## 2014-12-23 ENCOUNTER — Encounter (HOSPITAL_COMMUNITY)
Admission: RE | Admit: 2014-12-23 | Discharge: 2014-12-23 | Disposition: A | Discharge status: Home / Self Care | Payer: BLUE CROSS/BLUE SHIELD | Source: Ambulatory Visit | Attending: Cardiology | Admitting: Cardiology

## 2014-12-23 DIAGNOSIS — I213 ST elevation (STEMI) myocardial infarction of unspecified site: Secondary | ICD-10-CM | POA: Diagnosis not present

## 2014-12-25 ENCOUNTER — Encounter (HOSPITAL_COMMUNITY)
Admission: RE | Admit: 2014-12-25 | Discharge: 2014-12-25 | Disposition: A | Discharge status: Home / Self Care | Payer: BLUE CROSS/BLUE SHIELD | Source: Ambulatory Visit | Attending: Cardiology | Admitting: Cardiology

## 2014-12-25 DIAGNOSIS — I213 ST elevation (STEMI) myocardial infarction of unspecified site: Secondary | ICD-10-CM | POA: Diagnosis not present

## 2014-12-25 NOTE — Progress Notes (Signed)
Patient was given individual home exercise plan. Handout was reviewed and discussed. Patient verbalized an understanding. 

## 2014-12-27 ENCOUNTER — Encounter (HOSPITAL_COMMUNITY)
Admission: RE | Admit: 2014-12-27 | Discharge: 2014-12-27 | Disposition: A | Discharge status: Home / Self Care | Payer: BLUE CROSS/BLUE SHIELD | Source: Ambulatory Visit | Attending: Cardiology | Admitting: Cardiology

## 2014-12-27 DIAGNOSIS — I2510 Atherosclerotic heart disease of native coronary artery without angina pectoris: Secondary | ICD-10-CM | POA: Insufficient documentation

## 2014-12-27 DIAGNOSIS — I213 ST elevation (STEMI) myocardial infarction of unspecified site: Secondary | ICD-10-CM | POA: Insufficient documentation

## 2014-12-27 DIAGNOSIS — Z955 Presence of coronary angioplasty implant and graft: Secondary | ICD-10-CM | POA: Insufficient documentation

## 2014-12-30 ENCOUNTER — Encounter (HOSPITAL_COMMUNITY)
Admission: RE | Admit: 2014-12-30 | Discharge: 2014-12-30 | Disposition: A | Discharge status: Home / Self Care | Payer: BLUE CROSS/BLUE SHIELD | Source: Ambulatory Visit | Attending: Cardiology | Admitting: Cardiology

## 2014-12-30 DIAGNOSIS — I213 ST elevation (STEMI) myocardial infarction of unspecified site: Secondary | ICD-10-CM | POA: Diagnosis not present

## 2015-01-01 ENCOUNTER — Encounter (HOSPITAL_COMMUNITY)
Admission: RE | Admit: 2015-01-01 | Discharge: 2015-01-01 | Disposition: A | Discharge status: Home / Self Care | Payer: BLUE CROSS/BLUE SHIELD | Source: Ambulatory Visit | Attending: Cardiology | Admitting: Cardiology

## 2015-01-01 DIAGNOSIS — I213 ST elevation (STEMI) myocardial infarction of unspecified site: Secondary | ICD-10-CM | POA: Diagnosis not present

## 2015-01-03 ENCOUNTER — Encounter (HOSPITAL_COMMUNITY): Payer: BLUE CROSS/BLUE SHIELD

## 2015-01-06 ENCOUNTER — Encounter (HOSPITAL_COMMUNITY)
Admission: RE | Admit: 2015-01-06 | Discharge: 2015-01-06 | Disposition: A | Discharge status: Home / Self Care | Payer: BLUE CROSS/BLUE SHIELD | Source: Ambulatory Visit | Attending: Cardiology | Admitting: Cardiology

## 2015-01-06 DIAGNOSIS — I213 ST elevation (STEMI) myocardial infarction of unspecified site: Secondary | ICD-10-CM | POA: Diagnosis not present

## 2015-01-08 ENCOUNTER — Encounter (HOSPITAL_COMMUNITY)
Admission: RE | Admit: 2015-01-08 | Discharge: 2015-01-08 | Disposition: A | Discharge status: Home / Self Care | Payer: BLUE CROSS/BLUE SHIELD | Source: Ambulatory Visit | Attending: Cardiology | Admitting: Cardiology

## 2015-01-08 DIAGNOSIS — I213 ST elevation (STEMI) myocardial infarction of unspecified site: Secondary | ICD-10-CM | POA: Diagnosis not present

## 2015-01-10 ENCOUNTER — Encounter (HOSPITAL_COMMUNITY)
Admission: RE | Admit: 2015-01-10 | Discharge: 2015-01-10 | Disposition: A | Discharge status: Home / Self Care | Payer: BLUE CROSS/BLUE SHIELD | Source: Ambulatory Visit | Attending: Cardiology | Admitting: Cardiology

## 2015-01-10 DIAGNOSIS — I213 ST elevation (STEMI) myocardial infarction of unspecified site: Secondary | ICD-10-CM | POA: Diagnosis not present

## 2015-01-13 ENCOUNTER — Encounter (HOSPITAL_COMMUNITY)
Admission: RE | Admit: 2015-01-13 | Discharge: 2015-01-13 | Disposition: A | Discharge status: Home / Self Care | Payer: BLUE CROSS/BLUE SHIELD | Source: Ambulatory Visit | Attending: Cardiology | Admitting: Cardiology

## 2015-01-13 DIAGNOSIS — I213 ST elevation (STEMI) myocardial infarction of unspecified site: Secondary | ICD-10-CM | POA: Diagnosis not present

## 2015-01-14 ENCOUNTER — Ambulatory Visit (INDEPENDENT_AMBULATORY_CARE_PROVIDER_SITE_OTHER): Payer: BLUE CROSS/BLUE SHIELD | Admitting: Physician Assistant

## 2015-01-14 ENCOUNTER — Encounter: Payer: Self-pay | Admitting: Physician Assistant

## 2015-01-14 VITALS — BP 110/62 | HR 69 | Ht 64.0 in | Wt 254.0 lb

## 2015-01-14 DIAGNOSIS — I350 Nonrheumatic aortic (valve) stenosis: Secondary | ICD-10-CM | POA: Diagnosis not present

## 2015-01-14 DIAGNOSIS — R600 Localized edema: Secondary | ICD-10-CM

## 2015-01-14 DIAGNOSIS — R609 Edema, unspecified: Secondary | ICD-10-CM

## 2015-01-14 DIAGNOSIS — Z9861 Coronary angioplasty status: Secondary | ICD-10-CM

## 2015-01-14 DIAGNOSIS — R0989 Other specified symptoms and signs involving the circulatory and respiratory systems: Secondary | ICD-10-CM

## 2015-01-14 DIAGNOSIS — R0602 Shortness of breath: Secondary | ICD-10-CM

## 2015-01-14 DIAGNOSIS — I10 Essential (primary) hypertension: Secondary | ICD-10-CM

## 2015-01-14 DIAGNOSIS — I2510 Atherosclerotic heart disease of native coronary artery without angina pectoris: Secondary | ICD-10-CM | POA: Diagnosis not present

## 2015-01-14 DIAGNOSIS — M2550 Pain in unspecified joint: Secondary | ICD-10-CM

## 2015-01-14 NOTE — Patient Instructions (Addendum)
Your physician recommends that you schedule a follow-up appointment in: 2-4 weeks  Your physician recommends that you return for lab work in: Today  Your physician has requested that you have a carotid duplex. This test is an ultrasound of the carotid arteries in your neck. It looks at blood flow through these arteries that supply the brain with blood. Allow one hour for this exam. There are no restrictions or special instructions.   Your physician recommends that you continue on your current medications as directed. Please refer to the Current Medication list given to you today.  If you need a refill on your cardiac medications before your next appointment, please call your pharmacy.  Thank you for choosing [facility]!

## 2015-01-14 NOTE — Progress Notes (Signed)
Cardiology Office Note Date:  01/14/2015  Patient ID:  [name], [name] Mar 24, 1955, MRN [id number] PCP:  Monico Blitz, MD  Cardiologist:  Dr. Bronson Ing   Chief Complaint: edema, weight gain, joint pain  History of Present Illness: [name] is a 59 y.o. female with history of CAD (anterior STEMI 10/2014 s/p DES to distal LAD, EF preserved),  DM, HTN, COPD, HLD, tobacco abuse, mild AS who presents for evaluation of edema/weight gain. 2D echo 11/17/14: EF 60-65%, no RWMA, normal diastolic parameters, mild AS, PASP 22mmHg.  In 11/2014, the patient called in reporting diarrhea after anything she ate or drank, which she felt was related to her Coreg. It was not clear that this was contributing but she was switched to Lopressor 25mg  BID. She comes in today for several symptoms that she wonders are related to the BB - ever since starting metoprolol, she's had increasing edema in her hands as well as joint pain in her fingers. She had to have one of her rings cut off. She had been losing weight but states over the past week she's gained 10lbs back (by cardiac rehab scale). She has also noticed mild dyspnea while at cardiac rehab. She did not report any recurrent chest pain. She says she does not use salt and avoids processed/pre-packaged foods. She continues to smoke but has made efforts to cut own. No wheezing.   Past Medical History  Diagnosis Date  . Shingles   . Diabetes mellitus   . Hypertension   . COPD (chronic obstructive pulmonary disease) ([place])   . CAD (coronary artery disease)     a. 10/2014: Ant STEMI s/p DES to dLAD  . Tobacco abuse   . HLD (hyperlipidemia)   . Aortic stenosis     a. mild by 2D ECHO 11/17/14: . Valve area (VTI):  [name] Morbid obesity [facility])     Past Surgical History  Procedure Laterality Date  . Abdominal hysterectomy    . Cholecystectomy    . Spinal fusion    . Cardiac catheterization N/A 11/17/2014    Procedure: Left Heart Cath and Coronary  Angiography;  Surgeon: Wellington Hampshire, MD;  Location: [facility];  Service: Cardiovascular;  Laterality: N/A;  . Cardiac catheterization N/A 11/17/2014    Procedure: Coronary Stent Intervention;  Surgeon: Wellington Hampshire, MD;  Location: [facility];  Service: Cardiovascular;  Laterality: N/A;  distal lad 2.25x20 promus    Current Outpatient Prescriptions  Medication Sig Dispense Refill  . albuterol (PROVENTIL) (2.5 MG/3ML) 0.083% nebulizer solution Take 2.5 mg by nebulization every 6 (six) hours as needed. Shortness of Breath    . aspirin EC 81 MG tablet Take 81 mg by mouth daily.    [name] atorvastatin (LIPITOR) 80 MG tablet Take 1 tablet (80 mg total) by mouth daily at 6 PM. 90 tablet 3  . escitalopram (LEXAPRO) 20 MG tablet Take 20 mg by mouth daily.  2  . insulin detemir (LEVEMIR FLEXPEN) 100 UNIT/ML injection Inject 60 Units into the skin 2 (two) times daily.     [name] JANUVIA 100 MG tablet Take 100 mg by mouth daily.    [name] LEVEMIR FLEXTOUCH 100 UNIT/ML Pen INJECT 75 TO 80 UNITS SUBCUTANEOUSLY IN THE MORNING AND 80 UNITS IN THE EVENING  2  . lisinopril (PRINIVIL,ZESTRIL) 10 MG tablet Take 1 tablet (10 mg total) by mouth daily. 90 tablet 3  . metFORMIN (GLUCOPHAGE) 1000 MG tablet Take 1,000 mg by mouth 2 (two) times  daily with a meal.  6  . metoprolol tartrate (LOPRESSOR) 25 MG tablet Take 1 tablet (25 mg total) by mouth 2 (two) times daily. 180 tablet 3  . ticagrelor (BRILINTA) 90 MG TABS tablet Take 1 tablet (90 mg total) by mouth 2 (two) times daily. 180 tablet 3  . UNIFINE PENTIPS 31G X 8 MM MISC USE AS DIRECTED 10 TIMES A DAY  4   No current facility-administered medications for this visit.    Allergies:   Codeine; Sulfa antibiotics; and Sulfur   Social History:  The patient  reports that she has been smoking Cigarettes.  She has been smoking about 0.50 packs per day. She does not have any smokeless tobacco history on file. She reports that she does not drink alcohol or use  illicit drugs.   Family History:  The patient's family history includes Heart attack in her father; Heart disease in her father.  ROS:  Please see the history of present illness.  All other systems are reviewed and otherwise negative.   PHYSICAL EXAM:  VS:  BP 110/62 mmHg  Pulse 69  Ht 5\' 4"  (1.626 m)  Wt 254 lb (115.214 kg)  BMI 43.58 kg/m2  SpO2 98% BMI: Body mass index is 43.58 kg/(m^2). Well nourished, well developed obese WF in no acute distress HEENT: normocephalic, atraumatic Neck: no JVD or masses, + left carotid bruit, decreased carotid sounds on right Cardiac:  normal S1, S2; RRR. Soft SEM, no rubs or gallops Lungs:  clear to auscultation bilaterally, no wheezing, rhonchi or rales Abd: soft, nontender, no hepatomegaly, + BS MS: no deformity or atrophy Ext: trace edema of fingers, trace LEE although superimposed on large leg habitus Skin: warm and dry, no rash Neuro:  moves all extremities spontaneously, no focal abnormalities noted, follows commands Psych: euthymic mood, full affect   EKG:  Done today shows NSR 69bpm, low voltage in precordial leads, no acute ST-T changes  Recent Labs: 11/18/2014: ALT 28; Hemoglobin 14.2; Platelets 241 12/04/2014: BUN 18; Creat 0.73; Potassium 4.5; Sodium 139  11/18/2014: Cholesterol 108; HDL 28*; LDL Cholesterol 52; Total CHOL/HDL Ratio 3.9; Triglycerides 142; VLDL 28   CrCl cannot be calculated (Patient has no serum creatinine result on file.).   Wt Readings from Last 3 Encounters:  01/14/15 254 lb (115.214 kg)  12/04/14 254 lb 4.8 oz (115.35 kg)  11/26/14 254 lb (115.214 kg)     Other studies reviewed: Additional studies/records reviewed today include: summarized above  ASSESSMENT AND PLAN:  1. Edema/weight gain/joint pain/mild dyspnea - patient feels metoprolol may be the cause for these symptoms. The constellation of symptoms and relationship between them all is not entirely clear. Edema and joint pain are not typically a  side effect of metoprolol. Her echocardiogram did not suggest LV dysfunction or diastolic dysfunction. Her weight is stable by our scales, but she does report interim weight loss THEN gain between now and then. She has trace edema of her extremities. She reports following low-sodium diet. I will start by checking basic labs to rule out obvious metabolic issue - CBC, CMET (includes albumin), BNP, TSH, and CK (given joint pain/statin use). Her dyspnea could be due to BB since she has COPD and continues to smoke. If there is evidence of volume overload by BNP, would consider short-term trial of low-dose Lasix and changing metoprolol to Bystolic for its beta selectivity. If there is any evidence of myopathy by CK I will plan to hold statin as well. Note O2 sat in  clinic is normal, not tachycardic or tachypneic. 2. CAD as above - continue DAPT, BB, statin for now. Med changes pending the above. 3. Essential HTN - BP controlled. 4. Mild AS - doubt significant progression in 2 months based on valve sounds. Follow clinically.  5. Carotid bruit - may be related to AS but sound is asymmetrical - need to exclude carotid disease given known vascular disease. Will get carotid duplex.  Disposition: F/u with Dr. Bronson Ing or Jory Sims NP in 2-4 weeks.  Current medicines are reviewed at length with the patient today.  The patient did not have any concerns regarding medicines.  Signed, Melina Copa PA-C 01/14/2015 3:19 PM     [facility] [address] [place], [place] [address] [phone]

## 2015-01-15 ENCOUNTER — Telehealth: Payer: Self-pay | Admitting: *Deleted

## 2015-01-15 ENCOUNTER — Encounter (HOSPITAL_COMMUNITY): Payer: BLUE CROSS/BLUE SHIELD

## 2015-01-15 LAB — TSH: TSH: 2.063 u[IU]/mL (ref 0.350–4.500)

## 2015-01-15 LAB — CBC WITH DIFFERENTIAL/PLATELET
BASOS ABS: 0 10*3/uL (ref 0.0–0.1)
BASOS PCT: 0 % (ref 0–1)
EOS ABS: 0.1 10*3/uL (ref 0.0–0.7)
EOS PCT: 1 % (ref 0–5)
HCT: 35.7 % — ABNORMAL LOW (ref 36.0–46.0)
Hemoglobin: 11.4 g/dL — ABNORMAL LOW (ref 12.0–15.0)
LYMPHS ABS: 3.3 10*3/uL (ref 0.7–4.0)
Lymphocytes Relative: 28 % (ref 12–46)
MCH: 29.5 pg (ref 26.0–34.0)
MCHC: 31.9 g/dL (ref 30.0–36.0)
MCV: 92.5 fL (ref 78.0–100.0)
MPV: 9.4 fL (ref 8.6–12.4)
Monocytes Absolute: 0.7 10*3/uL (ref 0.1–1.0)
Monocytes Relative: 6 % (ref 3–12)
Neutro Abs: 7.6 10*3/uL (ref 1.7–7.7)
Neutrophils Relative %: 65 % (ref 43–77)
PLATELETS: 363 10*3/uL (ref 150–400)
RBC: 3.86 MIL/uL — ABNORMAL LOW (ref 3.87–5.11)
RDW: 14.2 % (ref 11.5–15.5)
WBC: 11.7 10*3/uL — ABNORMAL HIGH (ref 4.0–10.5)

## 2015-01-15 LAB — COMPREHENSIVE METABOLIC PANEL
ALT: 26 U/L (ref 6–29)
AST: 15 U/L (ref 10–35)
Albumin: 3.4 g/dL — ABNORMAL LOW (ref 3.6–5.1)
Alkaline Phosphatase: 72 U/L (ref 33–130)
BUN: 17 mg/dL (ref 7–25)
CHLORIDE: 104 mmol/L (ref 98–110)
CO2: 23 mmol/L (ref 20–31)
Calcium: 8.9 mg/dL (ref 8.6–10.4)
Creat: 0.81 mg/dL (ref 0.50–1.05)
GLUCOSE: 117 mg/dL — AB (ref 65–99)
POTASSIUM: 4.3 mmol/L (ref 3.5–5.3)
Sodium: 140 mmol/L (ref 135–146)
Total Bilirubin: 0.2 mg/dL (ref 0.2–1.2)
Total Protein: 6.2 g/dL (ref 6.1–8.1)

## 2015-01-15 LAB — BRAIN NATRIURETIC PEPTIDE: Brain Natriuretic Peptide: 107.2 pg/mL — ABNORMAL HIGH (ref 0.0–100.0)

## 2015-01-15 LAB — CK: CK TOTAL: 63 U/L (ref 7–177)

## 2015-01-15 NOTE — Telephone Encounter (Signed)
-----   Message from Charlie Pitter, [place] sent at 01/15/2015 10:28 AM EST ----- Please call patient. Labs show mildly elevated WBC count and decreased Hgb compared to before - has she noticed any unusual bleeding? BNP still not resulted yet. Will need to await this result before making decision about further med changes. Dayna Dunn PA-C

## 2015-01-15 NOTE — Telephone Encounter (Signed)
Patient states that she has only noticed a small amount blood in her stool 3 times over the last month. Patient states that she feels that it may have come from straining during have a BM with the last time being Sun.

## 2015-01-15 NOTE — Telephone Encounter (Signed)
Called patient with test results. No answer. Left message to call back.  

## 2015-01-16 ENCOUNTER — Telehealth: Payer: Self-pay

## 2015-01-16 ENCOUNTER — Other Ambulatory Visit: Payer: Self-pay

## 2015-01-16 DIAGNOSIS — D72819 Decreased white blood cell count, unspecified: Secondary | ICD-10-CM

## 2015-01-16 MED ORDER — FUROSEMIDE 20 MG PO TABS: 20.0000 mg | ORAL_TABLET | Freq: Every day | ORAL | Status: DC

## 2015-01-16 NOTE — Telephone Encounter (Signed)
-----   Message from Charlie Pitter, [place] sent at 01/16/2015  8:07 AM EST ----- D/w Dr. Bronson Ing. Her clinical scenario is still not totally clear - BNP is only marginally elevated. This is usually much higher if someone has a 10lb weight gain from CHF. Plus, her prior echo did not suggest predisposition to heart failure. Her WBC and falling Hgb need to be evaluated by her primary doctor (she should call them today to let them know of the labs) - question related to true anemia/blood loss, dilution from extra fluid on board, or possibly some sort of autoimmune process given her joint discomfort. Given her mild edema, would recommend a trial of low dose Lasix 20mg  daily x 3 days with recheck CBC on Monday and update on her symptoms.  Dayna Dunn PA-C

## 2015-01-16 NOTE — Progress Notes (Signed)
Quick Note:  D/w Dr. Bronson Ing. Her clinical scenario is still not totally clear - BNP is only marginally elevated. This is usually much higher if someone has a 10lb weight gain from CHF. Plus, her prior echo did not suggest predisposition to heart failure. Her WBC and falling Hgb need to be evaluated by her primary doctor (she should call them today to let them know of the labs) - question related to true anemia/blood loss, dilution from extra fluid on board, or possibly some sort of autoimmune process given her joint discomfort. Given her mild edema, would recommend a trial of low dose Lasix 20mg  daily x 3 days with recheck CBC on Monday and update on her symptoms.  Dayna Dunn PA-C  ______

## 2015-01-16 NOTE — Telephone Encounter (Signed)
Spoke to pt. And explained results of lab work. She voiced understanding. Sent in lasix 20 mg for 3 days & put order in for lab work.

## 2015-01-16 NOTE — Telephone Encounter (Signed)
See result note.  

## 2015-01-17 ENCOUNTER — Ambulatory Visit (HOSPITAL_COMMUNITY)
Admission: RE | Admit: 2015-01-17 | Discharge: 2015-01-17 | Disposition: A | Discharge status: Home / Self Care | Payer: BLUE CROSS/BLUE SHIELD | Source: Ambulatory Visit | Attending: Physician Assistant | Admitting: Physician Assistant

## 2015-01-17 ENCOUNTER — Encounter (HOSPITAL_COMMUNITY): Payer: BLUE CROSS/BLUE SHIELD

## 2015-01-17 DIAGNOSIS — I6523 Occlusion and stenosis of bilateral carotid arteries: Secondary | ICD-10-CM | POA: Diagnosis not present

## 2015-01-17 DIAGNOSIS — E119 Type 2 diabetes mellitus without complications: Secondary | ICD-10-CM | POA: Diagnosis not present

## 2015-01-17 DIAGNOSIS — R0989 Other specified symptoms and signs involving the circulatory and respiratory systems: Secondary | ICD-10-CM | POA: Insufficient documentation

## 2015-01-17 DIAGNOSIS — E785 Hyperlipidemia, unspecified: Secondary | ICD-10-CM | POA: Insufficient documentation

## 2015-01-17 DIAGNOSIS — I2510 Atherosclerotic heart disease of native coronary artery without angina pectoris: Secondary | ICD-10-CM | POA: Insufficient documentation

## 2015-01-17 DIAGNOSIS — I10 Essential (primary) hypertension: Secondary | ICD-10-CM | POA: Insufficient documentation

## 2015-01-17 DIAGNOSIS — Z87891 Personal history of nicotine dependence: Secondary | ICD-10-CM | POA: Diagnosis not present

## 2015-01-19 ENCOUNTER — Encounter: Payer: Self-pay | Admitting: Physician Assistant

## 2015-01-20 ENCOUNTER — Encounter (HOSPITAL_COMMUNITY): Payer: BLUE CROSS/BLUE SHIELD

## 2015-01-22 ENCOUNTER — Encounter (HOSPITAL_COMMUNITY)
Admission: RE | Admit: 2015-01-22 | Discharge: 2015-01-22 | Disposition: A | Discharge status: Home / Self Care | Payer: BLUE CROSS/BLUE SHIELD | Source: Ambulatory Visit | Attending: Cardiology | Admitting: Cardiology

## 2015-01-22 ENCOUNTER — Telehealth: Payer: Self-pay | Admitting: Cardiology

## 2015-01-22 DIAGNOSIS — I213 ST elevation (STEMI) myocardial infarction of unspecified site: Secondary | ICD-10-CM | POA: Diagnosis not present

## 2015-01-22 LAB — LIPID PANEL
Cholesterol: 83 mg/dL — ABNORMAL LOW (ref 125–200)
HDL: 31 mg/dL — AB (ref >=46)
LDL Cholesterol: 30 mg/dL (ref <130)
TRIGLYCERIDES: 109 mg/dL (ref <150)
Total CHOL/HDL Ratio: 2.7 Ratio (ref <=5.0)
VLDL: 22 mg/dL (ref <30)

## 2015-01-22 LAB — HEPATIC FUNCTION PANEL
ALBUMIN: 3.6 g/dL (ref 3.6–5.1)
ALK PHOS: 76 U/L (ref 33–130)
ALT: 26 U/L (ref 6–29)
AST: 14 U/L (ref 10–35)
BILIRUBIN INDIRECT: 0.3 mg/dL (ref 0.2–1.2)
Bilirubin, Direct: 0.1 mg/dL (ref <=0.2)
TOTAL PROTEIN: 6.4 g/dL (ref 6.1–8.1)
Total Bilirubin: 0.4 mg/dL (ref 0.2–1.2)

## 2015-01-22 NOTE — Telephone Encounter (Signed)
Patient aware of results per Melina Copa PA's note below.  Notes Recorded by Charlie Pitter, PA-C on 01/19/2015 at 7:55 AM Please inform patient of result - "Minimal to moderate amount of bilateral atherosclerotic plaque, right subjectively greater than left, not resulting in a hemodynamically significant stenosis within either internal carotid artery." No further workup needed at this time except she really needs to stop smoking (otherwise this disease will likely progress and could cause a stroke).

## 2015-01-22 NOTE — Telephone Encounter (Signed)
New Message  Pt returned call. Per Carotid ultra sound results

## 2015-01-23 ENCOUNTER — Telehealth: Payer: Self-pay

## 2015-01-23 MED ORDER — ATORVASTATIN CALCIUM 20 MG PO TABS: 20.0000 mg | ORAL_TABLET | Freq: Every day | ORAL | Status: DC

## 2015-01-23 NOTE — Telephone Encounter (Signed)
Lm on secure vm to decrease atorvastatin to 20 mg daily ,copy to pcp

## 2015-01-24 ENCOUNTER — Encounter (HOSPITAL_COMMUNITY)
Admission: RE | Admit: 2015-01-24 | Discharge: 2015-01-24 | Disposition: A | Discharge status: Home / Self Care | Payer: BLUE CROSS/BLUE SHIELD | Source: Ambulatory Visit | Attending: Cardiology | Admitting: Cardiology

## 2015-01-24 DIAGNOSIS — I213 ST elevation (STEMI) myocardial infarction of unspecified site: Secondary | ICD-10-CM | POA: Diagnosis not present

## 2015-01-27 ENCOUNTER — Encounter (HOSPITAL_COMMUNITY): Payer: BLUE CROSS/BLUE SHIELD

## 2015-01-29 ENCOUNTER — Encounter (HOSPITAL_COMMUNITY)
Admission: RE | Admit: 2015-01-29 | Discharge: 2015-01-29 | Disposition: A | Discharge status: Home / Self Care | Payer: BLUE CROSS/BLUE SHIELD | Source: Ambulatory Visit | Attending: Cardiology | Admitting: Cardiology

## 2015-01-29 DIAGNOSIS — I2510 Atherosclerotic heart disease of native coronary artery without angina pectoris: Secondary | ICD-10-CM | POA: Insufficient documentation

## 2015-01-29 DIAGNOSIS — I213 ST elevation (STEMI) myocardial infarction of unspecified site: Secondary | ICD-10-CM | POA: Insufficient documentation

## 2015-01-29 DIAGNOSIS — Z955 Presence of coronary angioplasty implant and graft: Secondary | ICD-10-CM | POA: Insufficient documentation

## 2015-01-31 ENCOUNTER — Encounter (HOSPITAL_COMMUNITY)
Admission: RE | Admit: 2015-01-31 | Discharge: 2015-01-31 | Disposition: A | Discharge status: Home / Self Care | Payer: BLUE CROSS/BLUE SHIELD | Source: Ambulatory Visit | Attending: Cardiology | Admitting: Cardiology

## 2015-01-31 DIAGNOSIS — I213 ST elevation (STEMI) myocardial infarction of unspecified site: Secondary | ICD-10-CM | POA: Diagnosis not present

## 2015-02-03 ENCOUNTER — Encounter (HOSPITAL_COMMUNITY): Payer: BLUE CROSS/BLUE SHIELD

## 2015-02-04 NOTE — Progress Notes (Signed)
Cardiac Rehabilitation Program Outcomes Report   Orientation:  12/04/14 Graduate Date:  tbd Discharge Date:  tbd # of sessions completed: 18  Cardiologist: Grant Ruts MD:  Jannifer Franklin Time:  1100  A.  Exercise Program:  Tolerates exercise @ 3.72 METS for 15 minutes  B.  Mental Health:  Good mental attitude  C.  Education/Instruction/Skills  Accurately checks own pulse.  Rest:  74  Exercise:  111 and Knows THR for exercise  Uses Perceived Exertion Scale and/or Dyspnea Scale  D.  Nutrition/Weight Control/Body Composition:  Adherence to prescribed nutrition program: fair    E.  Blood Lipids    Lab Results  Component Value Date   CHOL 83* 01/21/2015   HDL 31* 01/21/2015   LDLCALC 30 01/21/2015   TRIG 109 01/21/2015   CHOLHDL 2.7 01/21/2015    F.  Lifestyle Changes:  Making positive lifestyle changes and Continues to smoke  G.  Symptoms noted with exercise:  Asymptomatic  Report Completed By:  Stevphen Rochester RN   Comments:  This is the patients half way progress note for [facility]. Patient is progressing very well in the program.  Especially on the TM.

## 2015-02-05 ENCOUNTER — Encounter (HOSPITAL_COMMUNITY)
Admission: RE | Admit: 2015-02-05 | Discharge: 2015-02-05 | Disposition: A | Discharge status: Home / Self Care | Payer: BLUE CROSS/BLUE SHIELD | Source: Ambulatory Visit | Attending: Cardiology | Admitting: Cardiology

## 2015-02-05 DIAGNOSIS — I213 ST elevation (STEMI) myocardial infarction of unspecified site: Secondary | ICD-10-CM | POA: Diagnosis not present

## 2015-02-07 ENCOUNTER — Encounter (HOSPITAL_COMMUNITY)
Admission: RE | Admit: 2015-02-07 | Discharge: 2015-02-07 | Disposition: A | Discharge status: Home / Self Care | Payer: BLUE CROSS/BLUE SHIELD | Source: Ambulatory Visit | Attending: Cardiology | Admitting: Cardiology

## 2015-02-07 DIAGNOSIS — I213 ST elevation (STEMI) myocardial infarction of unspecified site: Secondary | ICD-10-CM | POA: Diagnosis not present

## 2015-02-10 ENCOUNTER — Encounter (HOSPITAL_COMMUNITY)
Admission: RE | Admit: 2015-02-10 | Discharge: 2015-02-10 | Disposition: A | Discharge status: Home / Self Care | Payer: BLUE CROSS/BLUE SHIELD | Source: Ambulatory Visit | Attending: Cardiology | Admitting: Cardiology

## 2015-02-10 DIAGNOSIS — I213 ST elevation (STEMI) myocardial infarction of unspecified site: Secondary | ICD-10-CM | POA: Diagnosis not present

## 2015-02-12 ENCOUNTER — Encounter (HOSPITAL_COMMUNITY)
Admission: RE | Admit: 2015-02-12 | Discharge: 2015-02-12 | Disposition: A | Discharge status: Home / Self Care | Payer: BLUE CROSS/BLUE SHIELD | Source: Ambulatory Visit | Attending: Cardiology | Admitting: Cardiology

## 2015-02-12 DIAGNOSIS — I213 ST elevation (STEMI) myocardial infarction of unspecified site: Secondary | ICD-10-CM | POA: Diagnosis not present

## 2015-02-14 ENCOUNTER — Encounter (HOSPITAL_COMMUNITY)
Admission: RE | Admit: 2015-02-14 | Discharge: 2015-02-14 | Disposition: A | Discharge status: Home / Self Care | Payer: BLUE CROSS/BLUE SHIELD | Source: Ambulatory Visit | Attending: Cardiology | Admitting: Cardiology

## 2015-02-14 DIAGNOSIS — I213 ST elevation (STEMI) myocardial infarction of unspecified site: Secondary | ICD-10-CM | POA: Diagnosis not present

## 2015-02-17 ENCOUNTER — Encounter (HOSPITAL_COMMUNITY)
Admission: RE | Admit: 2015-02-17 | Discharge: 2015-02-17 | Disposition: A | Discharge status: Home / Self Care | Payer: BLUE CROSS/BLUE SHIELD | Source: Ambulatory Visit | Attending: Cardiology | Admitting: Cardiology

## 2015-02-17 DIAGNOSIS — I213 ST elevation (STEMI) myocardial infarction of unspecified site: Secondary | ICD-10-CM | POA: Diagnosis not present

## 2015-02-19 ENCOUNTER — Encounter (HOSPITAL_COMMUNITY)
Admission: RE | Admit: 2015-02-19 | Discharge: 2015-02-19 | Disposition: A | Discharge status: Home / Self Care | Payer: BLUE CROSS/BLUE SHIELD | Source: Ambulatory Visit | Attending: Cardiology | Admitting: Cardiology

## 2015-02-19 DIAGNOSIS — I213 ST elevation (STEMI) myocardial infarction of unspecified site: Secondary | ICD-10-CM | POA: Diagnosis not present

## 2015-02-21 ENCOUNTER — Encounter (HOSPITAL_COMMUNITY)
Admission: RE | Admit: 2015-02-21 | Discharge: 2015-02-21 | Disposition: A | Discharge status: Home / Self Care | Payer: BLUE CROSS/BLUE SHIELD | Source: Ambulatory Visit | Attending: Cardiology | Admitting: Cardiology

## 2015-02-21 DIAGNOSIS — I213 ST elevation (STEMI) myocardial infarction of unspecified site: Secondary | ICD-10-CM | POA: Diagnosis not present

## 2015-02-24 ENCOUNTER — Encounter (HOSPITAL_COMMUNITY)
Admission: RE | Admit: 2015-02-24 | Discharge: 2015-02-24 | Disposition: A | Discharge status: Home / Self Care | Payer: BLUE CROSS/BLUE SHIELD | Source: Ambulatory Visit | Attending: Cardiology | Admitting: Cardiology

## 2015-02-24 DIAGNOSIS — I213 ST elevation (STEMI) myocardial infarction of unspecified site: Secondary | ICD-10-CM | POA: Diagnosis not present

## 2015-02-26 ENCOUNTER — Other Ambulatory Visit: Payer: Self-pay | Admitting: Physician Assistant

## 2015-02-26 ENCOUNTER — Encounter (HOSPITAL_COMMUNITY)
Admission: RE | Admit: 2015-02-26 | Discharge: 2015-02-26 | Disposition: A | Discharge status: Home / Self Care | Payer: BLUE CROSS/BLUE SHIELD | Source: Ambulatory Visit | Attending: Cardiology | Admitting: Cardiology

## 2015-02-26 DIAGNOSIS — I2510 Atherosclerotic heart disease of native coronary artery without angina pectoris: Secondary | ICD-10-CM | POA: Insufficient documentation

## 2015-02-26 DIAGNOSIS — Z955 Presence of coronary angioplasty implant and graft: Secondary | ICD-10-CM | POA: Insufficient documentation

## 2015-02-26 DIAGNOSIS — I213 ST elevation (STEMI) myocardial infarction of unspecified site: Secondary | ICD-10-CM | POA: Diagnosis present

## 2015-02-26 LAB — CBC
HEMATOCRIT: 37.9 % (ref 36.0–46.0)
HEMOGLOBIN: 11.7 g/dL — AB (ref 12.0–15.0)
MCH: 27.6 pg (ref 26.0–34.0)
MCHC: 30.9 g/dL (ref 30.0–36.0)
MCV: 89.4 fL (ref 78.0–100.0)
MPV: 9.6 fL (ref 8.6–12.4)
Platelets: 383 10*3/uL (ref 150–400)
RBC: 4.24 MIL/uL (ref 3.87–5.11)
RDW: 14.3 % (ref 11.5–15.5)
WBC: 12.5 10*3/uL — ABNORMAL HIGH (ref 4.0–10.5)

## 2015-02-28 ENCOUNTER — Other Ambulatory Visit: Payer: Self-pay | Admitting: *Deleted

## 2015-02-28 ENCOUNTER — Encounter (HOSPITAL_COMMUNITY)
Admission: RE | Admit: 2015-02-28 | Discharge: 2015-02-28 | Disposition: A | Discharge status: Home / Self Care | Payer: BLUE CROSS/BLUE SHIELD | Source: Ambulatory Visit | Attending: Cardiology | Admitting: Cardiology

## 2015-02-28 DIAGNOSIS — I213 ST elevation (STEMI) myocardial infarction of unspecified site: Secondary | ICD-10-CM | POA: Diagnosis not present

## 2015-03-03 ENCOUNTER — Encounter (HOSPITAL_COMMUNITY)
Admission: RE | Admit: 2015-03-03 | Discharge: 2015-03-03 | Disposition: A | Discharge status: Home / Self Care | Payer: BLUE CROSS/BLUE SHIELD | Source: Ambulatory Visit | Attending: Cardiology | Admitting: Cardiology

## 2015-03-03 DIAGNOSIS — I213 ST elevation (STEMI) myocardial infarction of unspecified site: Secondary | ICD-10-CM | POA: Diagnosis not present

## 2015-03-04 ENCOUNTER — Ambulatory Visit (INDEPENDENT_AMBULATORY_CARE_PROVIDER_SITE_OTHER): Payer: BLUE CROSS/BLUE SHIELD | Admitting: Cardiology

## 2015-03-04 ENCOUNTER — Encounter: Payer: Self-pay | Admitting: Cardiology

## 2015-03-04 VITALS — BP 118/66 | HR 71 | Ht 64.0 in | Wt 247.0 lb

## 2015-03-04 DIAGNOSIS — E7800 Pure hypercholesterolemia, unspecified: Secondary | ICD-10-CM

## 2015-03-04 DIAGNOSIS — I10 Essential (primary) hypertension: Secondary | ICD-10-CM

## 2015-03-04 DIAGNOSIS — I350 Nonrheumatic aortic (valve) stenosis: Secondary | ICD-10-CM

## 2015-03-04 DIAGNOSIS — I2510 Atherosclerotic heart disease of native coronary artery without angina pectoris: Secondary | ICD-10-CM

## 2015-03-04 DIAGNOSIS — E1159 Type 2 diabetes mellitus with other circulatory complications: Secondary | ICD-10-CM

## 2015-03-04 MED ORDER — ATORVASTATIN CALCIUM 10 MG PO TABS: 10.0000 mg | ORAL_TABLET | Freq: Every day | ORAL | Status: DC

## 2015-03-04 NOTE — Patient Instructions (Signed)
Your physician wants you to follow-up in: 6 months with Dr Ferne Reus will receive a reminder letter in the mail two months in advance. If you don't receive a letter, please call our office to schedule the follow-up appointment.    DECREASE Lipitor to 10 mg at dinner   JUST BEFORE next visit,get FASTING lab work :Lipids, LFT's    If you need a refill on your cardiac medications before your next appointment, please call your pharmacy.        Thank you for choosing [facility] !

## 2015-03-04 NOTE — Progress Notes (Signed)
Cardiology Office Note  Date: 03/04/2015   ID: [name], [name] Nov 02, 1955, MRN [id number]  PCP: Monico Blitz, MD  Primary Cardiologist: Rozann Lesches, MD   Chief Complaint  Patient presents with  . Coronary Artery Disease    History of Present Illness: [name] is a 60 y.o. female last seen in the office by [name] PA-C in December 2016. She presents for a follow-up visit. I reviewed extensive records and updated her chart. She tells me that she has been doing well, has been participating in cardiac rehabilitation and enjoying the program. She plans to do the maintenance program most likely. She feels that her energy has improved, she is not having any angina.  I reviewed her medications which are outlined below. She is concerned that Lipitor might be giving her some joint pains. Her lipids were quite low in December 2016 as detailed below. We talked about reducing the dose. Otherwise she has tolerated the remaining regimen including aspirin and Brilinta. She bruises easily but has had no major bleeding episodes. We discussed the importance of continuing DAPT at this time.  Her weight is down 7 pounds. She is trying to lose some weight. She has intermittent problems with ankle edema, has not been a major issue recently however. She is not on a diuretic.  Echocardiogram from October 2016 reported normal LVEF as well as diastolic parameters. She does have very mild aortic stenosis and cardiac murmur consistent with this.  Blood pressure is well controlled today on current medical regimen.  She follows with Dr. Manuella Ghazi for management of her diabetes mellitus. She sees him every 3 months.  Past Medical History  Diagnosis Date  . Shingles   . Type 2 diabetes mellitus ([place])   . Essential hypertension   . COPD (chronic obstructive pulmonary disease) ([place])   . CAD (coronary artery disease)     a. 10/2014: Ant STEMI s/p DES to dLAD  . HLD (hyperlipidemia)   . Aortic stenosis     a. Mild by 2D ECHO 11/17/14  . Morbid obesity ([place])   . Carotid disease, bilateral ([place])     a. Duplex 12/2014: mild-mod atherosclerotic plaque without hemodynamically significant stenosis.    Past Surgical History  Procedure Laterality Date  . Abdominal hysterectomy    . Cholecystectomy    . Spinal fusion    . Cardiac catheterization N/A 11/17/2014    Procedure: Left Heart Cath and Coronary Angiography;  Surgeon: Wellington Hampshire, MD;  Location: [facility];  Service: Cardiovascular;  Laterality: N/A;  . Cardiac catheterization N/A 11/17/2014    Procedure: Coronary Stent Intervention;  Surgeon: Wellington Hampshire, MD;  Location: [facility];  Service: Cardiovascular;  Laterality: N/A;  distal lad 2.25x20 promus    Current Outpatient Prescriptions  Medication Sig Dispense Refill  . albuterol (PROVENTIL) (2.5 MG/3ML) 0.083% nebulizer solution Take 2.5 mg by nebulization every 6 (six) hours as needed. Shortness of Breath    . aspirin EC 81 MG tablet Take 81 mg by mouth daily.    [name] escitalopram (LEXAPRO) 20 MG tablet Take 20 mg by mouth daily.  2  . insulin detemir (LEVEMIR FLEXPEN) 100 UNIT/ML injection Inject 60 Units into the skin 2 (two) times daily.     [name] JANUVIA 100 MG tablet Take 100 mg by mouth daily.    [name] LEVEMIR FLEXTOUCH 100 UNIT/ML Pen INJECT 75 TO 80 UNITS SUBCUTANEOUSLY IN THE MORNING AND 80 UNITS IN THE EVENING  2  . lisinopril (PRINIVIL,ZESTRIL) 10 MG tablet Take 1 tablet (10 mg total) by mouth daily. 90 tablet 3  . metFORMIN (GLUCOPHAGE) 1000 MG tablet Take 1,000 mg by mouth 2 (two) times daily with a meal.  6  . metoprolol tartrate (LOPRESSOR) 25 MG tablet Take 1 tablet (25 mg total) by mouth 2 (two) times daily. 180 tablet 3  . ticagrelor (BRILINTA) 90 MG TABS tablet Take 1 tablet (90 mg total) by mouth 2 (two) times daily. 180 tablet 3  . UNIFINE PENTIPS 31G X 8 MM MISC USE AS DIRECTED 10 TIMES A DAY  4  . atorvastatin (LIPITOR) 10 MG tablet Take 1 tablet  (10 mg total) by mouth daily. 90 tablet 3   No current facility-administered medications for this visit.   Allergies:  Codeine; Sulfa antibiotics; and Sulfur   Social History: The patient  reports that she has been smoking Cigarettes.  She has been smoking about 0.50 packs per day. She does not have any smokeless tobacco history on file. She reports that she does not drink alcohol or use illicit drugs.   ROS:  Please see the history of present illness. Otherwise, complete review of systems is positive for arthritic pains, mainly hands. No claudication.  All other systems are reviewed and negative.   Physical Exam: VS:  BP 118/66 mmHg  Pulse 71  Ht 5\' 4"  (1.626 m)  Wt 247 lb (112.038 kg)  BMI 42.38 kg/m2  SpO2 94%, BMI Body mass index is 42.38 kg/(m^2).  Wt Readings from Last 3 Encounters:  03/04/15 247 lb (112.038 kg)  01/14/15 254 lb (115.214 kg)  12/04/14 254 lb 4.8 oz (115.35 kg)    General: Obese woman, appears comfortable at rest. HEENT: Conjunctiva and lids normal, oropharynx clear with moist mucosa. Neck: Supple, no elevated JVP or carotid bruits, no thyromegaly. Lungs: Clear to auscultation, nonlabored breathing at rest. Cardiac: Regular rate and rhythm, no S3, 2/6 basal systolic murmur, no pericardial rub. Abdomen: Soft, nontender, bowel sounds present, no guarding or rebound. Extremities: No pitting edema, distal pulses 2+. Skin: Warm and dry. Musculoskeletal: No kyphosis. Neuropsychiatric: Alert and oriented x3, affect grossly appropriate.  ECG: I personally reviewed her tracing from 01/14/2015 which showed sinus rhythm with borderline low voltage and nonspecific T-wave changes.  Recent Labwork: 01/14/2015: BUN 17; Creat 0.81; Potassium 4.3; Sodium 140; TSH 2.063 01/21/2015: ALT 26; AST 14 02/26/2015: Hemoglobin 11.7*; Platelets 383     Component Value Date/Time   CHOL 83* 01/21/2015 1025   TRIG 109 01/21/2015 1025   HDL 31* 01/21/2015 1025   CHOLHDL 2.7  01/21/2015 1025   VLDL 22 01/21/2015 1025   LDLCALC 30 01/21/2015 1025    Other Studies Reviewed Today:  Echocardiogram 11/17/2014: Study Conclusions  - Left ventricle: The cavity size was normal. Wall thickness was normal. Systolic function was normal. The estimated ejection fraction was in the range of 60% to 65%. Wall motion was normal; there were no regional wall motion abnormalities. Left ventricular diastolic function parameters were normal. - Aortic valve: There was very mild stenosis. Valve area (VTI): 2.05 cm^2. Valve area (Vmax): 1.89 cm^2. Valve area (Vmean): 1.91 cm^2. - Pulmonary arteries: Systolic pressure was mildly increased. PA peak pressure: 35 mm Hg (S).  Cardiac catheterization 11/17/2014:  Prox Cx to Mid Cx lesion, 20% stenosed.  Mid Cx lesion, 30% stenosed.  Dist LAD lesion, 100% stenosed. Post intervention, there is a 0% residual stenosis.  Mid RCA lesion, 30% stenosed.  The left ventricular  systolic function is normal.  1. Severe one-vessel coronary artery disease with an occluded distal LAD. 2. Normal LV systolic function with apical akinesis. 3. Successful angioplasty and drug-eluting stent placement to the distal LAD.  Carotid Dopplers 01/17/2015: IMPRESSION: Minimal to moderate amount of bilateral atherosclerotic plaque, right subjectively greater than left, not resulting in a hemodynamically significant stenosis within either internal carotid artery.  Assessment and Plan:  1. Symptomatically stable CAD status post anterior STEMI in October 2016 managed with DES to the distal LAD. Otherwise she had mild nonobstructive disease. LVEF is normal. She plans to continue the cardiac rehabilitation program and then the maintenance program. Energy is improving and she has no active angina on medical therapy. We discussed the importance of continuing DAPT for now.  2. Mild aortic stenosis, cardiac murmur consistent with this on  examination. She is asymptomatic. Will consider a follow-up echocardiogram within the next 2-3 years.  3. Patient on statin therapy for lipid management. She has very low lipid numbers, LDL 30, total cholesterol 83. Reduce Lipitor 10 mg daily, particular with concerns about potential side effects. Repeat lipid panel for next visit.  4. Obesity. Patient is working on weight loss. I encouraged her to continue with a regular exercise plan. Her weight is down 7 pounds.  5. Essential hypertension, blood pressure is well controlled today.  6. Type 2 diabetes mellitus, keep follow-up with Dr. Manuella Ghazi.  Current medicines were reviewed with the patient today.   Orders Placed This Encounter  Procedures  . Hepatic function panel  . Lipid Profile    Disposition: FU with me in 6 months.   Signed, Satira Sark, MD, [facility] 03/04/2015 8:39 AM    [facility] [place]. [address] [place], [place] [address] Phone: [phone]; Fax: [phone]

## 2015-03-05 ENCOUNTER — Encounter (HOSPITAL_COMMUNITY)
Admission: RE | Admit: 2015-03-05 | Discharge: 2015-03-05 | Disposition: A | Discharge status: Home / Self Care | Payer: BLUE CROSS/BLUE SHIELD | Source: Ambulatory Visit | Attending: Cardiology | Admitting: Cardiology

## 2015-03-05 DIAGNOSIS — I213 ST elevation (STEMI) myocardial infarction of unspecified site: Secondary | ICD-10-CM | POA: Diagnosis not present

## 2015-03-07 ENCOUNTER — Encounter (HOSPITAL_COMMUNITY)
Admission: RE | Admit: 2015-03-07 | Discharge: 2015-03-07 | Disposition: A | Discharge status: Home / Self Care | Payer: BLUE CROSS/BLUE SHIELD | Source: Ambulatory Visit | Attending: Cardiology | Admitting: Cardiology

## 2015-03-07 DIAGNOSIS — I213 ST elevation (STEMI) myocardial infarction of unspecified site: Secondary | ICD-10-CM | POA: Diagnosis not present

## 2015-03-10 ENCOUNTER — Encounter (HOSPITAL_COMMUNITY): Payer: BLUE CROSS/BLUE SHIELD

## 2015-03-12 ENCOUNTER — Encounter (HOSPITAL_COMMUNITY)
Admission: RE | Admit: 2015-03-12 | Discharge: 2015-03-12 | Disposition: A | Discharge status: Home / Self Care | Payer: BLUE CROSS/BLUE SHIELD | Source: Ambulatory Visit | Attending: Cardiology | Admitting: Cardiology

## 2015-03-12 DIAGNOSIS — I213 ST elevation (STEMI) myocardial infarction of unspecified site: Secondary | ICD-10-CM | POA: Diagnosis not present

## 2015-03-14 ENCOUNTER — Encounter (HOSPITAL_COMMUNITY)
Admission: RE | Admit: 2015-03-14 | Discharge: 2015-03-14 | Disposition: A | Discharge status: Home / Self Care | Payer: BLUE CROSS/BLUE SHIELD | Source: Ambulatory Visit | Attending: Cardiology | Admitting: Cardiology

## 2015-03-14 DIAGNOSIS — I213 ST elevation (STEMI) myocardial infarction of unspecified site: Secondary | ICD-10-CM | POA: Diagnosis not present

## 2015-03-17 ENCOUNTER — Encounter (HOSPITAL_COMMUNITY)
Admission: RE | Admit: 2015-03-17 | Discharge: 2015-03-17 | Disposition: A | Discharge status: Home / Self Care | Payer: BLUE CROSS/BLUE SHIELD | Source: Ambulatory Visit | Attending: Cardiology | Admitting: Cardiology

## 2015-03-17 DIAGNOSIS — I213 ST elevation (STEMI) myocardial infarction of unspecified site: Secondary | ICD-10-CM | POA: Diagnosis not present

## 2015-04-08 NOTE — Progress Notes (Signed)
Patient is discharged from [place] and Pulmonary program today, 03/17/15 with 36 sessions.  She achieved LTG of 30 minutes of aerobic exercise at max met level of 4.20.  All patient vitals are WNL.  Patient has not met with dietician. Discharge instructions have been reviewed in detail and patient expressed an understanding of material given.  Patient plans to exercise at home. Cardiac Rehab will make 1 month, 6 month and 1 year call backs.  Patient had no complaints of any abnormal S/S or pain on their exit visit.

## 2015-04-08 NOTE — Progress Notes (Signed)
Cardiac Rehabilitation Program Outcomes Report   Orientation:  12/04/14 Graduate Date:  03/17/15 Discharge Date:  03/17/15 # of sessions completed: 36  Cardiologist: Grant Ruts MD:  Jannifer Franklin Time:  1100  A.  Exercise Program:  Tolerates exercise @ 4.20 METS for 15 minutes, Walk Test Results:  Post: 2.82 mets, Improved functional capacity  31.58 % and Improved  muscular strength  3.77 %  B.  Mental Health:  Good mental attitude, Quality of Life (QOL)  improvements:  Overall  2.39 %, Health/Functioning 0.85 %, Socioeconomics 0 %, Psych/Spiritual 9.37 %, Family 0 %   and PHQ-9: 0  C.  Education/Instruction/Skills  Accurately checks own pulse.  Rest:  72  Exercise:  94, Knows THR for exercise and Attended 13 education classes  Uses Perceived Exertion Scale and/or Dyspnea Scale  D.  Nutrition/Weight Control/Body Composition:  Adherence to prescribed nutrition program: good  and Patient has lost 4.3 kg   E.  Blood Lipids    Lab Results  Component Value Date   CHOL 83* 01/21/2015   HDL 31* 01/21/2015   LDLCALC 30 01/21/2015   TRIG 109 01/21/2015   CHOLHDL 2.7 01/21/2015    F.  Lifestyle Changes:  Making positive lifestyle changes, Continues to smoke and Needs physician encouragement on making lifestyle changes  G.  Symptoms noted with exercise:  Asymptomatic  Report Completed By:  Stevphen Rochester RN   Comments:  This is the patients graduation note for [facility].  Patient progressed very well in the program and plans to exercise at home for follow up exercise.

## 2015-04-08 NOTE — Addendum Note (Signed)
Encounter addended by: Cathie Olden, RN on: 04/08/2015  1:55 PM<BR>     Documentation filed: Clinical Notes, Notes Section

## 2015-05-17 ENCOUNTER — Emergency Department (HOSPITAL_COMMUNITY): Payer: BLUE CROSS/BLUE SHIELD

## 2015-05-17 ENCOUNTER — Emergency Department (HOSPITAL_COMMUNITY)
Admission: EM | Admit: 2015-05-17 | Discharge: 2015-05-17 | Disposition: A | Discharge status: Home / Self Care | Payer: BLUE CROSS/BLUE SHIELD | Attending: Emergency Medicine | Admitting: Emergency Medicine

## 2015-05-17 ENCOUNTER — Encounter (HOSPITAL_COMMUNITY): Payer: Self-pay | Admitting: Emergency Medicine

## 2015-05-17 DIAGNOSIS — I2510 Atherosclerotic heart disease of native coronary artery without angina pectoris: Secondary | ICD-10-CM | POA: Diagnosis not present

## 2015-05-17 DIAGNOSIS — R062 Wheezing: Secondary | ICD-10-CM | POA: Insufficient documentation

## 2015-05-17 DIAGNOSIS — Z794 Long term (current) use of insulin: Secondary | ICD-10-CM | POA: Insufficient documentation

## 2015-05-17 DIAGNOSIS — J449 Chronic obstructive pulmonary disease, unspecified: Secondary | ICD-10-CM | POA: Diagnosis not present

## 2015-05-17 DIAGNOSIS — Z79899 Other long term (current) drug therapy: Secondary | ICD-10-CM | POA: Diagnosis not present

## 2015-05-17 DIAGNOSIS — Z7982 Long term (current) use of aspirin: Secondary | ICD-10-CM | POA: Diagnosis not present

## 2015-05-17 DIAGNOSIS — F17210 Nicotine dependence, cigarettes, uncomplicated: Secondary | ICD-10-CM | POA: Insufficient documentation

## 2015-05-17 DIAGNOSIS — I10 Essential (primary) hypertension: Secondary | ICD-10-CM | POA: Insufficient documentation

## 2015-05-17 DIAGNOSIS — J45901 Unspecified asthma with (acute) exacerbation: Secondary | ICD-10-CM

## 2015-05-17 DIAGNOSIS — E785 Hyperlipidemia, unspecified: Secondary | ICD-10-CM | POA: Diagnosis not present

## 2015-05-17 DIAGNOSIS — E119 Type 2 diabetes mellitus without complications: Secondary | ICD-10-CM | POA: Diagnosis not present

## 2015-05-17 DIAGNOSIS — R05 Cough: Secondary | ICD-10-CM | POA: Diagnosis present

## 2015-05-17 LAB — I-STAT CHEM 8, ED
BUN: 15 mg/dL (ref 6–20)
Calcium, Ion: 1.12 mmol/L (ref 1.12–1.23)
Chloride: 105 mmol/L (ref 101–111)
Creatinine, Ser: 0.7 mg/dL (ref 0.44–1.00)
Glucose, Bld: 188 mg/dL — ABNORMAL HIGH (ref 65–99)
HCT: 33 % — ABNORMAL LOW (ref 36.0–46.0)
Hemoglobin: 11.2 g/dL — ABNORMAL LOW (ref 12.0–15.0)
Potassium: 3.7 mmol/L (ref 3.5–5.1)
Sodium: 142 mmol/L (ref 135–145)
TCO2: 22 mmol/L (ref 0–100)

## 2015-05-17 LAB — CBC WITH DIFFERENTIAL/PLATELET
Basophils Absolute: 0 10*3/uL (ref 0.0–0.1)
Basophils Relative: 0 %
Eosinophils Absolute: 0.1 10*3/uL (ref 0.0–0.7)
Eosinophils Relative: 1 %
HCT: 31.6 % — ABNORMAL LOW (ref 36.0–46.0)
Hemoglobin: 9.8 g/dL — ABNORMAL LOW (ref 12.0–15.0)
Lymphocytes Relative: 11 %
Lymphs Abs: 1.3 10*3/uL (ref 0.7–4.0)
MCH: 25.6 pg — ABNORMAL LOW (ref 26.0–34.0)
MCHC: 31 g/dL (ref 30.0–36.0)
MCV: 82.5 fL (ref 78.0–100.0)
Monocytes Absolute: 0.8 10*3/uL (ref 0.1–1.0)
Monocytes Relative: 6 %
Neutro Abs: 10.2 10*3/uL — ABNORMAL HIGH (ref 1.7–7.7)
Neutrophils Relative %: 82 %
Platelets: 352 10*3/uL (ref 150–400)
RBC: 3.83 MIL/uL — ABNORMAL LOW (ref 3.87–5.11)
RDW: 16.8 % — ABNORMAL HIGH (ref 11.5–15.5)
WBC: 12.3 10*3/uL — ABNORMAL HIGH (ref 4.0–10.5)

## 2015-05-17 LAB — I-STAT TROPONIN, ED: Troponin i, poc: 0 ng/mL (ref 0.00–0.08)

## 2015-05-17 LAB — BRAIN NATRIURETIC PEPTIDE: B NATRIURETIC PEPTIDE 5: 177 pg/mL — AB (ref 0.0–100.0)

## 2015-05-17 MED ORDER — ALBUTEROL SULFATE (2.5 MG/3ML) 0.083% IN NEBU
2.5000 mg | INHALATION_SOLUTION | Freq: Once | RESPIRATORY_TRACT | Status: AC
  Administered 2015-05-17: 2.5 mg via RESPIRATORY_TRACT
  Filled 2015-05-17: qty 3

## 2015-05-17 MED ORDER — PREDNISONE 20 MG PO TABS: 40.0000 mg | ORAL_TABLET | Freq: Every day | ORAL | Status: DC

## 2015-05-17 MED ORDER — ALBUTEROL SULFATE HFA 108 (90 BASE) MCG/ACT IN AERS: 2.0000 | INHALATION_SPRAY | RESPIRATORY_TRACT | Status: DC | PRN

## 2015-05-17 MED ORDER — FUROSEMIDE 40 MG PO TABS
20.0000 mg | ORAL_TABLET | Freq: Once | ORAL | Status: AC
  Administered 2015-05-17: 20 mg via ORAL
  Filled 2015-05-17: qty 1

## 2015-05-17 MED ORDER — ALBUTEROL SULFATE (2.5 MG/3ML) 0.083% IN NEBU: 2.5000 mg | INHALATION_SOLUTION | RESPIRATORY_TRACT | Status: DC | PRN

## 2015-05-17 MED ORDER — PREDNISONE 50 MG PO TABS
60.0000 mg | ORAL_TABLET | Freq: Once | ORAL | Status: AC
  Administered 2015-05-17: 60 mg via ORAL
  Filled 2015-05-17: qty 1

## 2015-05-17 MED ORDER — IPRATROPIUM-ALBUTEROL 0.5-2.5 (3) MG/3ML IN SOLN
3.0000 mL | Freq: Once | RESPIRATORY_TRACT | Status: AC
  Administered 2015-05-17: 3 mL via RESPIRATORY_TRACT
  Filled 2015-05-17: qty 3

## 2015-05-17 NOTE — ED Notes (Signed)
Pt states she has gotten progressively more short of breath as the day has gone on.  Began this morning.  Does not use nebs at home.

## 2015-05-17 NOTE — Discharge Instructions (Signed)
Take the prescriptions as directed.  Use your albuterol inhaler (2 to 4 puffs) or your albuterol nebulizer (1 unit dose) every 4 hours for the next 7 days, then as needed for cough, wheezing, or shortness of breath. Your BNP test ("test for congestive heart failure") was mildly elevated today and you were given a small dose of lasix.  Call your Cardiologist on Monday morning to schedule a follow up appointment within the next 2 days to follow up this finding. Call your regular medical doctor Monday to schedule a follow up appointment within the next 3 days.  Return to the Emergency Department immediately sooner if worsening.

## 2015-05-17 NOTE — ED Notes (Signed)
Ambulated Patient around Nurses Station Oxygen 98 Pulse 74. No complaints or concerns from the patient.

## 2015-05-17 NOTE — ED Provider Notes (Signed)
CSN: [id number]     Arrival date & time 05/17/15  1621 History   First MD Initiated Contact with Patient 05/17/15 1625     Chief Complaint  Patient presents with  . Shortness of Breath      HPI Pt was seen at 1625.  Per pt, c/o gradual onset and worsening of persistent cough, wheezing and SOB for the past 2 days.  Has been associated with generalized chest "tightness." Describes her symptoms as "I think my COPD is acting up."  Has been using home MDI with transient relief.  Denies CP/palpitations, no back pain, no abd pain, no N/V/D, no fevers, no rash.     Past Medical History  Diagnosis Date  . Shingles   . Type 2 diabetes mellitus ([place])   . Essential hypertension   . COPD (chronic obstructive pulmonary disease) ([place])   . CAD (coronary artery disease)     a. 10/2014: Ant STEMI s/p DES to dLAD  . HLD (hyperlipidemia)   . Aortic stenosis     a. Mild by 2D ECHO 11/17/14  . Morbid obesity ([place])   . Carotid disease, bilateral ([place])     a. Duplex 12/2014: mild-mod atherosclerotic plaque without hemodynamically significant stenosis.   Past Surgical History  Procedure Laterality Date  . Abdominal hysterectomy    . Cholecystectomy    . Spinal fusion    . Cardiac catheterization N/A 11/17/2014    Procedure: Left Heart Cath and Coronary Angiography;  Surgeon: Wellington Hampshire, MD;  Location: [facility];  Service: Cardiovascular;  Laterality: N/A;  . Cardiac catheterization N/A 11/17/2014    Procedure: Coronary Stent Intervention;  Surgeon: Wellington Hampshire, MD;  Location: [facility];  Service: Cardiovascular;  Laterality: N/A;  distal lad 2.25x20 promus   Family History  Problem Relation Age of Onset  . Heart attack Father   . Heart disease Father    Social History  Substance Use Topics  . Smoking status: Current Some Day Smoker -- 0.50 packs/day    Types: Cigarettes  . Smokeless tobacco: None     Comment: 3 cigs per day  . Alcohol Use: No    Review of  Systems ROS: Statement: All systems negative except as marked or noted in the HPI; Constitutional: Negative for fever and chills. ; ; Eyes: Negative for eye pain, redness and discharge. ; ; ENMT: Negative for ear pain, hoarseness, nasal congestion, sinus pressure and sore throat. ; ; Cardiovascular: Negative for palpitations, diaphoresis, and peripheral edema. ; ; Respiratory: +chest tightness, cough, wheezing, SOB. Negative for stridor. ; ; Gastrointestinal: Negative for nausea, vomiting, diarrhea, abdominal pain, blood in stool, hematemesis, jaundice and rectal bleeding. . ; ; Genitourinary: Negative for dysuria, flank pain and hematuria. ; ; Musculoskeletal: Negative for back pain and neck pain. Negative for swelling and trauma.; ; Skin: Negative for pruritus, rash, abrasions, blisters, bruising and skin lesion.; ; Neuro: Negative for headache, lightheadedness and neck stiffness. Negative for weakness, altered level of consciousness , altered mental status, extremity weakness, paresthesias, involuntary movement, seizure and syncope.      Allergies  Codeine; Sulfa antibiotics; and Sulfur  Home Medications   Prior to Admission medications   Medication Sig Start Date End Date Taking? Authorizing Provider  albuterol (PROVENTIL) (2.5 MG/3ML) 0.083% nebulizer solution Take 2.5 mg by nebulization every 6 (six) hours as needed. Shortness of Breath   Yes Historical Provider, MD  aspirin EC 81 MG tablet Take 81 mg by mouth daily.  Yes Historical Provider, MD  atorvastatin (LIPITOR) 10 MG tablet Take 1 tablet (10 mg total) by mouth daily. 03/04/15  Yes Satira Sark, MD  escitalopram (LEXAPRO) 20 MG tablet Take 20 mg by mouth daily. 09/02/14  Yes Historical Provider, MD  insulin detemir (LEVEMIR FLEXPEN) 100 UNIT/ML injection Inject 60 Units into the skin 2 (two) times daily.    Yes Historical Provider, MD  JANUVIA 100 MG tablet Take 100 mg by mouth daily. 10/16/12  Yes Historical Provider, MD  JARDIANCE  25 MG TABS tablet Take 25 mg by mouth daily. 03/08/15  Yes Historical Provider, MD  lisinopril (PRINIVIL,ZESTRIL) 10 MG tablet Take 1 tablet (10 mg total) by mouth daily. 11/26/14  Yes Lendon Colonel, NP  metFORMIN (GLUCOPHAGE) 1000 MG tablet Take 1,000 mg by mouth 2 (two) times daily with a meal. 09/26/14  Yes Historical Provider, MD  metoprolol tartrate (LOPRESSOR) 25 MG tablet Take 1 tablet (25 mg total) by mouth 2 (two) times daily. 12/04/14  Yes Lendon Colonel, NP  ticagrelor (BRILINTA) 90 MG TABS tablet Take 1 tablet (90 mg total) by mouth 2 (two) times daily. 11/26/14  Yes Lendon Colonel, NP  UNIFINE PENTIPS 31G X 8 MM MISC USE AS DIRECTED 10 TIMES A DAY 11/04/14   Historical Provider, MD   BP 106/93 mmHg  Pulse 81  Temp(Src) 98.3 F (36.8 C) (Oral)  Resp 22  Ht 5\' 4"  (1.626 m)  Wt 263 lb (119.296 kg)  BMI 45.12 kg/m2  SpO2 92% Physical Exam  1630: Physical examination:  Nursing notes reviewed; Vital signs and O2 SAT reviewed;  Constitutional: Well developed, Well nourished, Well hydrated, In no acute distress; Head:  Normocephalic, atraumatic; Eyes: EOMI, PERRL, No scleral icterus; ENMT: Mouth and pharynx normal, Mucous membranes moist; Neck: Supple, Full range of motion, No lymphadenopathy; Cardiovascular: Regular rate and rhythm, No gallop; Respiratory: Breath sounds diminished & equal bilaterally, faint wheezes. No audible wheezing. Speaking full sentences with ease, Normal respiratory effort/excursion; Chest: Nontender, Movement normal; Abdomen: Soft, Nontender, Nondistended, Normal bowel sounds; Genitourinary: No CVA tenderness; Extremities: Pulses normal, No tenderness, No edema, No calf edema or asymmetry.; Neuro: AA&Ox3, Major CN grossly intact.  Speech clear. No gross focal motor or sensory deficits in extremities.; Skin: Color normal, Warm, Dry.   ED Course  Procedures (including critical care time) Labs Review   Imaging Review  I have personally reviewed and  evaluated these images and lab results as part of my medical decision-making.   EKG Interpretation   Date/Time:  Saturday May 17 2015 16:43:33 EDT Ventricular Rate:  71 PR Interval:  132 QRS Duration: 85 QT Interval:  596 QTC Calculation: 648 R Axis:   96 Text Interpretation:  Sinus rhythm Borderline right axis deviation Low  voltage, precordial leads Nonspecific T abnormalities, lateral leads  Prolonged QT interval Baseline wander When compared with ECG of 11/18/2014  QT has lengthened Otherwise no significant change Confirmed by [facility]   MD, Nunzio Cory [phone]) on 05/17/2015 5:04:39 PM      MDM  MDM Reviewed: previous chart, nursing note and vitals Reviewed previous: labs and ECG Interpretation: labs, ECG and x-ray     Results for orders placed or performed during the hospital encounter of 05/17/15  CBC with Differential  Result Value Ref Range   WBC 12.3 (H) 4.0 - 10.5 K/uL   RBC 3.83 (L) 3.87 - 5.11 MIL/uL   Hemoglobin 9.8 (L) 12.0 - 15.0 g/dL   HCT 31.6 (L) 36.0 - 46.0 %  MCV 82.5 78.0 - 100.0 fL   MCH 25.6 (L) 26.0 - 34.0 pg   MCHC 31.0 30.0 - 36.0 g/dL   RDW 16.8 (H) 11.5 - 15.5 %   Platelets 352 150 - 400 K/uL   Neutrophils Relative % 82 %   Neutro Abs 10.2 (H) 1.7 - 7.7 K/uL   Lymphocytes Relative 11 %   Lymphs Abs 1.3 0.7 - 4.0 K/uL   Monocytes Relative 6 %   Monocytes Absolute 0.8 0.1 - 1.0 K/uL   Eosinophils Relative 1 %   Eosinophils Absolute 0.1 0.0 - 0.7 K/uL   Basophils Relative 0 %   Basophils Absolute 0.0 0.0 - 0.1 K/uL  Brain natriuretic peptide  Result Value Ref Range   B Natriuretic Peptide 177.0 (H) 0.0 - 100.0 pg/mL  I-stat troponin, ED  Result Value Ref Range   Troponin i, poc 0.00 0.00 - 0.08 ng/mL   Comment 3          I-stat Chem 8, ED  Result Value Ref Range   Sodium 142 135 - 145 mmol/L   Potassium 3.7 3.5 - 5.1 mmol/L   Chloride 105 101 - 111 mmol/L   BUN 15 6 - 20 mg/dL   Creatinine, Ser 0.70 0.44 - 1.00 mg/dL   Glucose,  Bld 188 (H) 65 - 99 mg/dL   Calcium, Ion 1.12 1.12 - 1.23 mmol/L   TCO2 22 0 - 100 mmol/L   Hemoglobin 11.2 (L) 12.0 - 15.0 g/dL   HCT 33.0 (L) 36.0 - 46.0 %   Dg Chest 2 View 05/17/2015  CLINICAL DATA:  Shortness of breath for 2 days EXAM: CHEST  2 VIEW COMPARISON:  10/15/2013 FINDINGS: Cardiac shadow is within normal limits. Increased vascular congestion and mild interstitial changes are noted consistent with mild CHF and interstitial edema. Postsurgical changes are again seen in the cervical spine. No focal confluent infiltrate or sizable effusion is seen. No bony abnormality is noted. IMPRESSION: Changes consistent with CHF. Electronically Signed   By: Inez Catalina M.D.   On: 05/17/2015 17:50    2015:  Pt states she "feels better" after neb and steroid.  NAD, lungs CTA bilat, no wheezing, resps easy, speaking full sentences, Sats 100% R/A.  Pt ambulated around the ED with Sats remaining 98 % R/A, resps easy, NAD. Pt has tol PO well while in the ED without N/V. Pt states she wants to go home now. BNP mildly elevated from baseline and CXR with changes of mild CHF; will dose lasix while in the ED. Pt has hx of normal EF by recent echocardiogram; pt will need to f/u with Cards MD on Monday for further eval. Pt verb understanding and agreeable with plan. Will tx pt for asthma exacerbation. Doubt PE as cause for symptoms with low risk [place].  Doubt ACS as cause for symptoms with normal troponin and unchanged EKG from previous after 2 days of constant atypical symptoms. Dx and testing d/w pt.  Questions answered.  Verb understanding, agreeable to d/c home with outpt f/u.    Francine Graven, DO 05/21/15 1452

## 2015-05-22 ENCOUNTER — Ambulatory Visit (INDEPENDENT_AMBULATORY_CARE_PROVIDER_SITE_OTHER): Payer: BLUE CROSS/BLUE SHIELD | Admitting: Physician Assistant

## 2015-05-22 ENCOUNTER — Encounter: Payer: Self-pay | Admitting: Physician Assistant

## 2015-05-22 ENCOUNTER — Telehealth: Payer: Self-pay | Admitting: Cardiology

## 2015-05-22 VITALS — BP 120/72 | HR 68 | Ht 64.0 in | Wt 258.0 lb

## 2015-05-22 DIAGNOSIS — R0602 Shortness of breath: Secondary | ICD-10-CM

## 2015-05-22 DIAGNOSIS — I2510 Atherosclerotic heart disease of native coronary artery without angina pectoris: Secondary | ICD-10-CM | POA: Diagnosis not present

## 2015-05-22 DIAGNOSIS — I5031 Acute diastolic (congestive) heart failure: Secondary | ICD-10-CM

## 2015-05-22 MED ORDER — POTASSIUM CHLORIDE CRYS ER 20 MEQ PO TBCR: 20.0000 meq | EXTENDED_RELEASE_TABLET | Freq: Every day | ORAL | Status: DC

## 2015-05-22 MED ORDER — FUROSEMIDE 20 MG PO TABS: 20.0000 mg | ORAL_TABLET | Freq: Every day | ORAL | Status: DC

## 2015-05-22 NOTE — Patient Instructions (Signed)
Your physician recommends that you schedule a follow-up appointment in 1 week.  Your physician has recommended you make the following change in your medication:   Start Lasix 20 mg Daily  Start Potassium 10 mEq Daily   Your physician recommends that you return for lab work in: [place] has requested that you have an echocardiogram in 1 week. Echocardiography is a painless test that uses sound waves to create images of your heart. It provides your doctor with information about the size and shape of your heart and how well your heart's chambers and valves are working. This procedure takes approximately one hour. There are no restrictions for this procedure.  Your physician recommends that you weigh, daily, at the same time every day, and in the same amount of clothing. Please record your daily weights on the handout provided and bring it to your next appointment.   If you need a refill on your cardiac medications before your next appointment, please call your pharmacy.  Thank you for choosing [facility]!  Low-Sodium Eating Plan Sodium raises blood pressure and causes water to be held in the body. Getting less sodium from food will help lower your blood pressure, reduce any swelling, and protect your heart, liver, and kidneys. We get sodium by adding salt (sodium chloride) to food. Most of our sodium comes from canned, boxed, and frozen foods. Restaurant foods, fast foods, and pizza are also very high in sodium. Even if you take medicine to lower your blood pressure or to reduce fluid in your body, getting less sodium from your food is important. WHAT IS MY PLAN? Most people should limit their sodium intake to 2,300 mg a day. Your health care provider recommends that you limit your sodium intake to ___2000 mg_______ a day.  WHAT DO I NEED TO KNOW ABOUT THIS EATING PLAN? For the low-sodium eating plan, you will follow these general guidelines:  Choose foods with a %  Daily Value for sodium of less than 5% (as listed on the food label).   Use salt-free seasonings or herbs instead of table salt or sea salt.   Check with your health care provider or pharmacist before using salt substitutes.   Eat fresh foods.  Eat more vegetables and fruits.  Limit canned vegetables. If you do use them, rinse them well to decrease the sodium.   Limit cheese to 1 oz (28 g) per day.   Eat lower-sodium products, often labeled as "lower sodium" or "no salt added."  Avoid foods that contain monosodium glutamate (MSG). MSG is sometimes added to [place] food and some canned foods.  Check food labels (Nutrition Facts labels) on foods to learn how much sodium is in one serving.  Eat more home-cooked food and less restaurant, buffet, and fast food.  When eating at a restaurant, ask that your food be prepared with less salt, or no salt if possible.  HOW DO I READ FOOD LABELS FOR SODIUM INFORMATION? The Nutrition Facts label lists the amount of sodium in one serving of the food. If you eat more than one serving, you must multiply the listed amount of sodium by the number of servings. Food labels may also identify foods as:  Sodium free--Less than 5 mg in a serving.  Very low sodium--35 mg or less in a serving.  Low sodium--140 mg or less in a serving.  Light in sodium--50% less sodium in a serving. For example, if a food that usually has 300 mg  of sodium is changed to become light in sodium, it will have 150 mg of sodium.  Reduced sodium--25% less sodium in a serving. For example, if a food that usually has 400 mg of sodium is changed to reduced sodium, it will have 300 mg of sodium. WHAT FOODS CAN I EAT? Grains Low-sodium cereals, including oats, puffed wheat and rice, and shredded wheat cereals. Low-sodium crackers. Unsalted rice and pasta. Lower-sodium bread.  Vegetables Frozen or fresh vegetables. Low-sodium or reduced-sodium canned vegetables. Low-sodium  or reduced-sodium tomato sauce and paste. Low-sodium or reduced-sodium tomato and vegetable juices.  Fruits Fresh, frozen, and canned fruit. Fruit juice.  Meat and Other Protein Products Low-sodium canned tuna and salmon. Fresh or frozen meat, poultry, seafood, and fish. Lamb. Unsalted nuts. Dried beans, peas, and lentils without added salt. Unsalted canned beans. Homemade soups without salt. Eggs.  Dairy Milk. Soy milk. Ricotta cheese. Low-sodium or reduced-sodium cheeses. Yogurt.  Condiments Fresh and dried herbs and spices. Salt-free seasonings. Onion and garlic powders. Low-sodium varieties of mustard and ketchup. Fresh or refrigerated horseradish. Lemon juice.  Fats and Oils Reduced-sodium salad dressings. Unsalted butter.  Other Unsalted popcorn and pretzels.  The items listed above may not be a complete list of recommended foods or beverages. Contact your dietitian for more options. WHAT FOODS ARE NOT RECOMMENDED? Grains Instant hot cereals. Bread stuffing, pancake, and biscuit mixes. Croutons. Seasoned rice or pasta mixes. Noodle soup cups. Boxed or frozen macaroni and cheese. Self-rising flour. Regular salted crackers. Vegetables Regular canned vegetables. Regular canned tomato sauce and paste. Regular tomato and vegetable juices. Frozen vegetables in sauces. Salted [place] fries. Olives. Angie Fava. Relishes. Sauerkraut. Salsa. Meat and Other Protein Products Salted, canned, smoked, spiced, or pickled meats, seafood, or fish. Bacon, ham, sausage, hot dogs, corned beef, chipped beef, and packaged luncheon meats. Salt pork. Jerky. Pickled herring. Anchovies, regular canned tuna, and sardines. Salted nuts. Dairy Processed cheese and cheese spreads. Cheese curds. Blue cheese and cottage cheese. Buttermilk.  Condiments Onion and garlic salt, seasoned salt, table salt, and sea salt. Canned and packaged gravies. Worcestershire sauce. Tartar sauce. Barbecue sauce. Teriyaki sauce.  Soy sauce, including reduced sodium. Steak sauce. Fish sauce. Oyster sauce. Cocktail sauce. Horseradish that you find on the shelf. Regular ketchup and mustard. Meat flavorings and tenderizers. Bouillon cubes. Hot sauce. Tabasco sauce. Marinades. Taco seasonings. Relishes. Fats and Oils Regular salad dressings. Salted butter. Margarine. Ghee. Bacon fat.  Other Potato and tortilla chips. Corn chips and puffs. Salted popcorn and pretzels. Canned or dried soups. Pizza. Frozen entrees and pot pies.  The items listed above may not be a complete list of foods and beverages to avoid. Contact your dietitian for more information.   This information is not intended to replace advice given to you by your health care provider. Make sure you discuss any questions you have with your health care provider.   Document Released: 07/03/2001 Document Revised: 02/01/2014 Document Reviewed: 11/15/2012 Elsevier Interactive Patient Education [employer].

## 2015-05-22 NOTE — Progress Notes (Signed)
Cardiology Office Note   Date:  05/22/2015   ID:  [name], [name] 05-01-1955, MRN [id number]  PCP:  Monico Blitz, MD  Cardiologist:  Dr Joie Bimler, PA-C   History of Present Illness: [name] is a 60 y.o. female with a history of DM, HTN, COPD, STEMI 10/2014 w/ DES LAD, HLD, mild AS, morbid obesity.  [name] presents for followup of an ER visit for SOB, chest tightness. She was diagnosed with mild volume overload and given Lasix. She was also diagnosed with an asthma attack. She was given nebs and steroids in the ER. She is currently taking prednisone 20 mg, 2 doses left.   She has gained 13 lbs on her home scale. Denies PND or orthopnea, but has LE edema. Increased DOE, sudden in onset over the last week. No recent travel, no recent URI.   She has not had chest pain. She denies dietary indiscretion. She is wearing a step counter watch and she has been trying to get between 8000 and 9000 steps daily. Her blood sugars have been running a little bit higher than usual because of the steroids, but she is compensating for that with her insulin.   Past Medical History  Diagnosis Date  . Shingles   . Type 2 diabetes mellitus ([place])   . Essential hypertension   . COPD (chronic obstructive pulmonary disease) ([place])   . CAD (coronary artery disease)     a. 10/2014: Ant STEMI s/p DES to dLAD  . HLD (hyperlipidemia)   . Aortic stenosis     a. Mild by 2D ECHO 11/17/14  . Morbid obesity ([place])   . Carotid disease, bilateral ([place])     a. Duplex 12/2014: mild-mod atherosclerotic plaque without hemodynamically significant stenosis.    Past Surgical History  Procedure Laterality Date  . Abdominal hysterectomy    . Cholecystectomy    . Spinal fusion    . Cardiac catheterization N/A 11/17/2014    Procedure: Left Heart Cath and Coronary Angiography;  Surgeon: Wellington Hampshire, MD;  Location: [facility];  Service: Cardiovascular;  Laterality: N/A;  .  Cardiac catheterization N/A 11/17/2014    Procedure: Coronary Stent Intervention;  Surgeon: Wellington Hampshire, MD;  Location: [facility];  Service: Cardiovascular;  Laterality: N/A;  distal lad 2.25x20 promus    Current Outpatient Prescriptions  Medication Sig Dispense Refill  . albuterol (PROVENTIL HFA;VENTOLIN HFA) 108 (90 Base) MCG/ACT inhaler Inhale 2 puffs into the lungs every 4 (four) hours as needed for wheezing or shortness of breath. 1 Inhaler 0  . albuterol (PROVENTIL) (2.5 MG/3ML) 0.083% nebulizer solution Take 3 mLs (2.5 mg total) by nebulization every 4 (four) hours as needed for wheezing or shortness of breath. 75 mL 0  . aspirin EC 81 MG tablet Take 81 mg by mouth daily.    [name] atorvastatin (LIPITOR) 10 MG tablet Take 1 tablet (10 mg total) by mouth daily. 90 tablet 3  . escitalopram (LEXAPRO) 20 MG tablet Take 20 mg by mouth daily.  2  . insulin detemir (LEVEMIR FLEXPEN) 100 UNIT/ML injection Inject 60 Units into the skin 2 (two) times daily.     [name] JANUVIA 100 MG tablet Take 100 mg by mouth daily.    [name] JARDIANCE 25 MG TABS tablet Take 25 mg by mouth daily.  2  . lisinopril (PRINIVIL,ZESTRIL) 10 MG tablet Take 1 tablet (10 mg total) by mouth daily. 90 tablet 3  .  metFORMIN (GLUCOPHAGE) 1000 MG tablet Take 1,000 mg by mouth 2 (two) times daily with a meal.  6  . metoprolol tartrate (LOPRESSOR) 25 MG tablet Take 1 tablet (25 mg total) by mouth 2 (two) times daily. 180 tablet 3  . predniSONE (DELTASONE) 20 MG tablet Take 2 tablets (40 mg total) by mouth daily. 10 tablet 0  . ticagrelor (BRILINTA) 90 MG TABS tablet Take 1 tablet (90 mg total) by mouth 2 (two) times daily. 180 tablet 3  . UNIFINE PENTIPS 31G X 8 MM MISC USE AS DIRECTED 10 TIMES A DAY  4   No current facility-administered medications for this visit.    Allergies:   Codeine; Sulfa antibiotics; and Sulfur    Social History:  The patient  reports that she has been smoking Cigarettes.  She has been smoking about  0.50 packs per day. She does not have any smokeless tobacco history on file. She reports that she does not drink alcohol or use illicit drugs.   Family History:  The patient's family history includes Heart attack in her father; Heart disease in her father.    ROS:  Please see the history of present illness. All other systems are reviewed and negative.    PHYSICAL EXAM: VS:  BP 120/72 mmHg  Pulse 68  Ht 5\' 4"  (1.626 m)  Wt 258 lb (117.028 kg)  BMI 44.26 kg/m2  SpO2 99% , BMI Body mass index is 44.26 kg/(m^2). GEN: Well nourished, well developed, female in no acute distress HEENT: normal for age  Neck: JVD at 10 cm, no carotid bruit, no masses Cardiac: RRR; soft murmur, no rubs, or gallops Respiratory: decreased breath sounds bases with a few rales bilaterally, slightly increased work of breathing GI: soft, nontender, nondistended, + BS MS: no deformity or atrophy; 1-2+ edema; distal pulses are 2+ in all 4 extremities  Skin: warm and dry, no rash Neuro:  Strength and sensation are intact Psych: euthymic mood, full affect   EKG:  EKG is not ordered today.  Recent Labs: 01/14/2015: TSH 2.063 01/21/2015: ALT 26 05/17/2015: B Natriuretic Peptide 177.0*; BUN 15; Creatinine, Ser 0.70; Hemoglobin 9.8*; Platelets 352; Potassium 3.7; Sodium 142    Lipid Panel    Component Value Date/Time   CHOL 83* 01/21/2015 1025   TRIG 109 01/21/2015 1025   HDL 31* 01/21/2015 1025   CHOLHDL 2.7 01/21/2015 1025   VLDL 22 01/21/2015 1025   LDLCALC 30 01/21/2015 1025     Wt Readings from Last 3 Encounters:  05/22/15 258 lb (117.028 kg)  05/17/15 263 lb (119.296 kg)  03/04/15 247 lb (112.038 kg)     Other studies Reviewed: Additional studies/ records that were reviewed today include: Hospital records, office notes and testing.  ASSESSMENT AND PLAN:  1.  Acute diastolic CHF: She does not have a history of volume overload and her left ventricular function was normal with normal diastolic  parameters by echocardiogram at the time of her MI.  She's not aware of any events or dietary indiscretions that could have led to this.  She is Lasix 98 so we will start her on Lasix at 20 mg daily along with potassium 10 mEq daily. She is to come in next week for BMET and echo, then followup with her practitioner. If her EF has changed, ischemic evaluation will be worked up.  2. History of CAD: Before this acute illness, she was doing between 72 and 9000 steps daily. She was not having any problems with activity  and having no exertional chest pain. She has chronic dyspnea on exertion but this is at baseline. Continue aspirin, Lipitor, lisinopril, metoprolol, and Brilinta. Followup on echocardiogram results.   Current medicines are reviewed at length with the patient today.  The patient does not have concerns regarding medicines.  The following changes have been made:  Add Lasix and potassium  Labs/ tests ordered today include: BMET   Disposition:   FU with Dr Domenic Polite  Signed, Rosaria Ferries, PA-C  05/22/2015 4:07 PM    [facility] Phone: [phone]; Fax: [phone]  This note was written with the assistance of speech recognition software. Please excuse any transcriptional errors.

## 2015-05-22 NOTE — Telephone Encounter (Signed)
[name] was seen in the ER on 05/17/15 at [facility] for Asthma. Was given Prednisone 60mg  and Lasix 20mg . States that started swelling feet and legs with shortness of breath. Please call and advise on her cell phone # [phone].

## 2015-05-22 NOTE — Telephone Encounter (Signed)
Apt made for today at 1530 hrs

## 2015-05-27 ENCOUNTER — Ambulatory Visit (HOSPITAL_COMMUNITY)
Admission: RE | Admit: 2015-05-27 | Discharge: 2015-05-27 | Disposition: A | Discharge status: Home / Self Care | Payer: BLUE CROSS/BLUE SHIELD | Source: Ambulatory Visit | Attending: Physician Assistant | Admitting: Physician Assistant

## 2015-05-27 ENCOUNTER — Other Ambulatory Visit (HOSPITAL_COMMUNITY)
Admission: RE | Admit: 2015-05-27 | Discharge: 2015-05-27 | Disposition: A | Discharge status: Home / Self Care | Payer: BLUE CROSS/BLUE SHIELD | Source: Ambulatory Visit | Attending: Physician Assistant | Admitting: Physician Assistant

## 2015-05-27 DIAGNOSIS — I340 Nonrheumatic mitral (valve) insufficiency: Secondary | ICD-10-CM | POA: Diagnosis not present

## 2015-05-27 DIAGNOSIS — R0602 Shortness of breath: Secondary | ICD-10-CM | POA: Insufficient documentation

## 2015-05-27 DIAGNOSIS — Z6841 Body Mass Index (BMI) 40.0 and over, adult: Secondary | ICD-10-CM | POA: Insufficient documentation

## 2015-05-27 DIAGNOSIS — I10 Essential (primary) hypertension: Secondary | ICD-10-CM | POA: Diagnosis not present

## 2015-05-27 DIAGNOSIS — E669 Obesity, unspecified: Secondary | ICD-10-CM | POA: Insufficient documentation

## 2015-05-27 DIAGNOSIS — I252 Old myocardial infarction: Secondary | ICD-10-CM | POA: Insufficient documentation

## 2015-05-27 DIAGNOSIS — I071 Rheumatic tricuspid insufficiency: Secondary | ICD-10-CM | POA: Insufficient documentation

## 2015-05-27 DIAGNOSIS — I350 Nonrheumatic aortic (valve) stenosis: Secondary | ICD-10-CM | POA: Insufficient documentation

## 2015-05-27 LAB — BASIC METABOLIC PANEL
ANION GAP: 9 (ref 5–15)
BUN: 22 mg/dL — ABNORMAL HIGH (ref 6–20)
CALCIUM: 8.6 mg/dL — AB (ref 8.9–10.3)
CO2: 23 mmol/L (ref 22–32)
Chloride: 108 mmol/L (ref 101–111)
Creatinine, Ser: 0.96 mg/dL (ref 0.44–1.00)
Glucose, Bld: 147 mg/dL — ABNORMAL HIGH (ref 65–99)
Potassium: 4.3 mmol/L (ref 3.5–5.1)
Sodium: 140 mmol/L (ref 135–145)

## 2015-05-29 ENCOUNTER — Ambulatory Visit (INDEPENDENT_AMBULATORY_CARE_PROVIDER_SITE_OTHER): Payer: BLUE CROSS/BLUE SHIELD | Admitting: Adult Health

## 2015-05-29 ENCOUNTER — Telehealth: Payer: Self-pay

## 2015-05-29 ENCOUNTER — Encounter: Payer: Self-pay | Admitting: Adult Health

## 2015-05-29 VITALS — BP 122/62 | HR 84 | Ht 64.0 in | Wt 246.0 lb

## 2015-05-29 DIAGNOSIS — I10 Essential (primary) hypertension: Secondary | ICD-10-CM | POA: Diagnosis not present

## 2015-05-29 DIAGNOSIS — I2510 Atherosclerotic heart disease of native coronary artery without angina pectoris: Secondary | ICD-10-CM | POA: Diagnosis not present

## 2015-05-29 DIAGNOSIS — I359 Nonrheumatic aortic valve disorder, unspecified: Secondary | ICD-10-CM

## 2015-05-29 DIAGNOSIS — I5031 Acute diastolic (congestive) heart failure: Secondary | ICD-10-CM | POA: Diagnosis not present

## 2015-05-29 NOTE — Progress Notes (Signed)
Cardiology Office Note   Date:  05/29/2015   ID:  [name], [name] 1955-03-08, MRN [id number]  PCP:  Monico Blitz, MD  Cardiologist:  [place]/ Jory Sims, NP   No chief complaint on file.     History of Present Illness: [name] is a 60 y.o. female who presents for ongoing assessment and management of diabetes, hypertension, COPD, ST elevation MI in October of 2016 with drug-eluting stent to the LAD, hyperlipidemia, with mild aortic sclerosis. The patient was last seen in the office on 05/22/2015 with complaints of shortness of breath and chest tightness after ER visit for same. She was found to have some mild volume overload. She was started on Lasix 20 mg daily with potassium 10 mEq daily. BMET was ordered and she is here for followup concerning her response to meds.   Labs dated 05/27/2015: Na: 140,potassium 4.3 chloride 108 CO2 23 BUN 22, creatinine 0.96.  She is doing much better. She has lost 8 pounds. Breathing is better no swelling.  Echocardiogram completed on 05/27/2015 revealed LV size is normal LV systolic function was normal the 60-65%. She did have some mildly calcified leaflets of her aortic valve with moderate calcification involving the left coronary cusp. Mild stenosis.  Past Medical History  Diagnosis Date  . Shingles   . Type 2 diabetes mellitus ([place])   . Essential hypertension   . COPD (chronic obstructive pulmonary disease) ([place])   . CAD (coronary artery disease)     a. 10/2014: Ant STEMI s/p DES to dLAD  . HLD (hyperlipidemia)   . Aortic stenosis     a. Mild by 2D ECHO 11/17/14  . Morbid obesity ([place])   . Carotid disease, bilateral ([place])     a. Duplex 12/2014: mild-mod atherosclerotic plaque without hemodynamically significant stenosis.    Past Surgical History  Procedure Laterality Date  . Abdominal hysterectomy    . Cholecystectomy    . Spinal fusion    . Cardiac catheterization N/A 11/17/2014    Procedure: Left Heart Cath and Coronary  Angiography;  Surgeon: Wellington Hampshire, MD;  Location: [facility];  Service: Cardiovascular;  Laterality: N/A;  . Cardiac catheterization N/A 11/17/2014    Procedure: Coronary Stent Intervention;  Surgeon: Wellington Hampshire, MD;  Location: [facility];  Service: Cardiovascular;  Laterality: N/A;  distal lad 2.25x20 promus     Current Outpatient Prescriptions  Medication Sig Dispense Refill  . albuterol (PROVENTIL HFA;VENTOLIN HFA) 108 (90 Base) MCG/ACT inhaler Inhale 2 puffs into the lungs every 4 (four) hours as needed for wheezing or shortness of breath. 1 Inhaler 0  . albuterol (PROVENTIL) (2.5 MG/3ML) 0.083% nebulizer solution Take 3 mLs (2.5 mg total) by nebulization every 4 (four) hours as needed for wheezing or shortness of breath. 75 mL 0  . aspirin EC 81 MG tablet Take 81 mg by mouth daily.    [name] atorvastatin (LIPITOR) 10 MG tablet Take 1 tablet (10 mg total) by mouth daily. 90 tablet 3  . escitalopram (LEXAPRO) 20 MG tablet Take 20 mg by mouth daily.  2  . furosemide (LASIX) 20 MG tablet Take 1 tablet (20 mg total) by mouth daily. 90 tablet 3  . insulin detemir (LEVEMIR FLEXPEN) 100 UNIT/ML injection Inject 60 Units into the skin 2 (two) times daily.     [name] JANUVIA 100 MG tablet Take 100 mg by mouth daily.    [name] JARDIANCE 25 MG TABS tablet Take 25 mg by mouth daily.  2  . lisinopril (PRINIVIL,ZESTRIL) 10 MG tablet Take 1 tablet (10 mg total) by mouth daily. 90 tablet 3  . metFORMIN (GLUCOPHAGE) 1000 MG tablet Take 1,000 mg by mouth 2 (two) times daily with a meal.  6  . metoprolol tartrate (LOPRESSOR) 25 MG tablet Take 1 tablet (25 mg total) by mouth 2 (two) times daily. 180 tablet 3  . potassium chloride SA (KLOR-CON M20) 20 MEQ tablet Take 1 tablet (20 mEq total) by mouth daily. 90 tablet 3  . predniSONE (DELTASONE) 20 MG tablet Take 2 tablets (40 mg total) by mouth daily. 10 tablet 0  . ticagrelor (BRILINTA) 90 MG TABS tablet Take 1 tablet (90 mg total) by mouth 2 (two)  times daily. 180 tablet 3  . UNIFINE PENTIPS 31G X 8 MM MISC USE AS DIRECTED 10 TIMES A DAY  4   No current facility-administered medications for this visit.    Allergies:   Codeine; Sulfa antibiotics; and Sulfur    Social History:  The patient  reports that she has been smoking Cigarettes.  She has been smoking about 0.50 packs per day. She does not have any smokeless tobacco history on file. She reports that she does not drink alcohol or use illicit drugs.   Family History:  The patient's family history includes Heart attack in her father; Heart disease in her father.    ROS: All other systems are reviewed and negative. Unless otherwise mentioned in H&P    PHYSICAL EXAM: VS:  Pulse 84  Ht 5\' 4"  (1.626 m)  Wt 246 lb (111.585 kg)  BMI 42.21 kg/m2  SpO2 95% , BMI Body mass index is 42.21 kg/(m^2). GEN: Well nourished, well developed, in no acute distress HEENT: normal Neck: no JVD, carotid bruits, or masses Cardiac: RRR; no murmurs, 1/6 systolic murmur, rubs, or gallops,no edema  Respiratory:  Clear to auscultation bilaterally, normal work of breathing GI: soft, nontender, nondistended, + BS MS: no deformity or atrophy Skin: warm and dry, no rash Neuro:  Strength and sensation are intact Psych: euthymic mood, full affect  Recent Labs: 01/14/2015: TSH 2.063 01/21/2015: ALT 26 05/17/2015: B Natriuretic Peptide 177.0*; Hemoglobin 9.8*; Platelets 352 05/27/2015: BUN 22*; Creatinine, Ser 0.96; Potassium 4.3; Sodium 140    Lipid Panel    Component Value Date/Time   CHOL 83* 01/21/2015 1025   TRIG 109 01/21/2015 1025   HDL 31* 01/21/2015 1025   CHOLHDL 2.7 01/21/2015 1025   VLDL 22 01/21/2015 1025   LDLCALC 30 01/21/2015 1025      Wt Readings from Last 3 Encounters:  05/29/15 246 lb (111.585 kg)  05/22/15 258 lb (117.028 kg)  05/17/15 263 lb (119.296 kg)       ASSESSMENT AND PLAN:  1.  Acute diastolic heart failure: completely resolved with the addition of Lasix 20  mg daily. The patient has lost 8 pounds. She stopped taking prednisone that she was prescribed for bronchitis as well. She believes this is what brought her fluid overload on. She will continue her current medication regimen without changes. Labs look good.  I've given her a copy of her echocardiogram report.  2. Hypertension:blood pressure is currently well-controlled.  . 3. Coronary artery disease:continue dual antiplatelet therapy, beta blocker, and aspirin.   Current medicines are reviewed at length with the patient today.    Labs/ tests ordered today include:  No orders of the defined types were placed in this encounter.     Disposition:   FU with Dr. Domenic Polite.  Signed, Jory Sims, NP  05/29/2015 1:53 PM    [facility] [address], [place], [place] [address] Phone: [phone]; Fax: [phone]

## 2015-05-29 NOTE — Telephone Encounter (Signed)
Called pt. No answer, left voicemail t return call.

## 2015-05-29 NOTE — Progress Notes (Signed)
Name: [name]    DOB: [birth year]  Age: 60 y.o.  MR#: [id number]       PCP:  Monico Blitz, MD      Insurance: Payor: BLUE CROSS BLUE SHIELD / Plan: BCBS OTHER / Product Type: *No Product type* /   CC:    Chief Complaint  Patient presents with  . Coronary Artery Disease  . Hypertension    VS Filed Vitals:   05/29/15 1352  BP: 122/62  Pulse: 84  Height: 5\' 4"  (1.626 m)  Weight: 246 lb (111.585 kg)  SpO2: 95%    Weights Current Weight  05/29/15 246 lb (111.585 kg)  05/22/15 258 lb (117.028 kg)  05/17/15 263 lb (119.296 kg)    Blood Pressure  BP Readings from Last 3 Encounters:  05/29/15 122/62  05/22/15 120/72  05/17/15 131/56     Admit date:  (Not on file) Last encounter with RMR:  12/02/2014   Allergy Codeine; Sulfa antibiotics; and Sulfur  Current Outpatient Prescriptions  Medication Sig Dispense Refill  . albuterol (PROVENTIL HFA;VENTOLIN HFA) 108 (90 Base) MCG/ACT inhaler Inhale 2 puffs into the lungs every 4 (four) hours as needed for wheezing or shortness of breath. 1 Inhaler 0  . albuterol (PROVENTIL) (2.5 MG/3ML) 0.083% nebulizer solution Take 3 mLs (2.5 mg total) by nebulization every 4 (four) hours as needed for wheezing or shortness of breath. 75 mL 0  . aspirin EC 81 MG tablet Take 81 mg by mouth daily.    [name] atorvastatin (LIPITOR) 10 MG tablet Take 1 tablet (10 mg total) by mouth daily. 90 tablet 3  . escitalopram (LEXAPRO) 20 MG tablet Take 20 mg by mouth daily.  2  . furosemide (LASIX) 20 MG tablet Take 1 tablet (20 mg total) by mouth daily. 90 tablet 3  . insulin detemir (LEVEMIR FLEXPEN) 100 UNIT/ML injection Inject 60 Units into the skin 2 (two) times daily.     [name] JANUVIA 100 MG tablet Take 100 mg by mouth daily.    [name] JARDIANCE 25 MG TABS tablet Take 25 mg by mouth daily.  2  . lisinopril (PRINIVIL,ZESTRIL) 10 MG tablet Take 1 tablet (10 mg total) by mouth daily. 90 tablet 3  . metFORMIN (GLUCOPHAGE) 1000 MG tablet Take 1,000 mg by mouth 2 (two)  times daily with a meal.  6  . metoprolol tartrate (LOPRESSOR) 25 MG tablet Take 1 tablet (25 mg total) by mouth 2 (two) times daily. 180 tablet 3  . potassium chloride SA (KLOR-CON M20) 20 MEQ tablet Take 1 tablet (20 mEq total) by mouth daily. 90 tablet 3  . predniSONE (DELTASONE) 20 MG tablet Take 2 tablets (40 mg total) by mouth daily. 10 tablet 0  . ticagrelor (BRILINTA) 90 MG TABS tablet Take 1 tablet (90 mg total) by mouth 2 (two) times daily. 180 tablet 3  . UNIFINE PENTIPS 31G X 8 MM MISC USE AS DIRECTED 10 TIMES A DAY  4   No current facility-administered medications for this visit.    Discontinued Meds:   There are no discontinued medications.  Patient Active Problem List   Diagnosis Date Noted  . Morbid obesity ([place]) 01/14/2015  . CAD (coronary artery disease)   . COPD (chronic obstructive pulmonary disease) ([place])   . Hypertension   . DM type 2 (diabetes mellitus, type 2) ([place])   . Tobacco abuse   . HLD (hyperlipidemia)   . Aortic stenosis   . ST elevation (STEMI) myocardial infarction involving left anterior descending  coronary artery (HCC) 11/17/2014    LABS    Component Value Date/Time   NA 140 05/27/2015 0945   NA 142 05/17/2015 1707   NA 140 01/14/2015 1557   K 4.3 05/27/2015 0945   K 3.7 05/17/2015 1707   K 4.3 01/14/2015 1557   CL 108 05/27/2015 0945   CL 105 05/17/2015 1707   CL 104 01/14/2015 1557   CO2 23 05/27/2015 0945   CO2 23 01/14/2015 1557   CO2 22 12/04/2014 1135   GLUCOSE 147* 05/27/2015 0945   GLUCOSE 188* 05/17/2015 1707   GLUCOSE 117* 01/14/2015 1557   BUN 22* 05/27/2015 0945   BUN 15 05/17/2015 1707   BUN 17 01/14/2015 1557   CREATININE 0.96 05/27/2015 0945   CREATININE 0.70 05/17/2015 1707   CREATININE 0.81 01/14/2015 1557   CREATININE 0.73 12/04/2014 1135   CREATININE 0.78 11/18/2014 0252   CALCIUM 8.6* 05/27/2015 0945   CALCIUM 8.9 01/14/2015 1557   CALCIUM 9.1 12/04/2014 1135   GFRNONAA >60 05/27/2015 0945   GFRNONAA >60  11/18/2014 0252   GFRNONAA >60 11/17/2014 0419   GFRAA >60 05/27/2015 0945   GFRAA >60 11/18/2014 0252   GFRAA >60 11/17/2014 0419   CMP     Component Value Date/Time   NA 140 05/27/2015 0945   K 4.3 05/27/2015 0945   CL 108 05/27/2015 0945   CO2 23 05/27/2015 0945   GLUCOSE 147* 05/27/2015 0945   BUN 22* 05/27/2015 0945   CREATININE 0.96 05/27/2015 0945   CREATININE 0.81 01/14/2015 1557   CALCIUM 8.6* 05/27/2015 0945   PROT 6.4 01/21/2015 1025   ALBUMIN 3.6 01/21/2015 1025   AST 14 01/21/2015 1025   ALT 26 01/21/2015 1025   ALKPHOS 76 01/21/2015 1025   BILITOT 0.4 01/21/2015 1025   GFRNONAA >60 05/27/2015 0945   GFRAA >60 05/27/2015 0945       Component Value Date/Time   WBC 12.3* 05/17/2015 1904   WBC 12.5* 02/26/2015 0808   WBC 11.7* 01/14/2015 1557   HGB 9.8* 05/17/2015 1904   HGB 11.2* 05/17/2015 1707   HGB 11.7* 02/26/2015 0808   HCT 31.6* 05/17/2015 1904   HCT 33.0* 05/17/2015 1707   HCT 37.9 02/26/2015 0808   MCV 82.5 05/17/2015 1904   MCV 89.4 02/26/2015 0808   MCV 92.5 01/14/2015 1557    Lipid Panel     Component Value Date/Time   CHOL 83* 01/21/2015 1025   TRIG 109 01/21/2015 1025   HDL 31* 01/21/2015 1025   CHOLHDL 2.7 01/21/2015 1025   VLDL 22 01/21/2015 1025   LDLCALC 30 01/21/2015 1025    ABG    Component Value Date/Time   TCO2 22 05/17/2015 1707     Lab Results  Component Value Date   TSH 2.063 01/14/2015   BNP (last 3 results)  Recent Labs  05/17/15 1904  BNP 177.0*    ProBNP (last 3 results) No results for input(s): PROBNP in the last 8760 hours.  Cardiac Panel (last 3 results) No results for input(s): CKTOTAL, CKMB, TROPONINI, RELINDX in the last 72 hours.  Iron/TIBC/Ferritin/ %Sat No results found for: IRON, TIBC, FERRITIN, IRONPCTSAT   EKG Orders placed or performed during the hospital encounter of 05/17/15  . ED EKG  . ED EKG  . EKG 12-Lead  . EKG 12-Lead  . EKG     Prior Assessment and Plan Problem List  as of 05/29/2015      Cardiovascular and Mediastinum   ST elevation (STEMI)  myocardial infarction involving left anterior descending coronary artery (HCC)   CAD (coronary artery disease)   Hypertension   Aortic stenosis     Respiratory   COPD (chronic obstructive pulmonary disease) ([place])     Endocrine   DM type 2 (diabetes mellitus, type 2) ([place])     Other   Tobacco abuse   HLD (hyperlipidemia)   Morbid obesity ([place])       Imaging: Dg Chest 2 View  05/17/2015  CLINICAL DATA:  Shortness of breath for 2 days EXAM: CHEST  2 VIEW COMPARISON:  10/15/2013 FINDINGS: Cardiac shadow is within normal limits. Increased vascular congestion and mild interstitial changes are noted consistent with mild CHF and interstitial edema. Postsurgical changes are again seen in the cervical spine. No focal confluent infiltrate or sizable effusion is seen. No bony abnormality is noted. IMPRESSION: Changes consistent with CHF. Electronically Signed   By: Inez Catalina M.D.   On: 05/17/2015 17:50

## 2015-05-29 NOTE — Patient Instructions (Signed)
Medication Instructions:  Your physician recommends that you continue on your current medications as directed. Please refer to the Current Medication list given to you today.   Labwork: none  Testing/Procedures: none  Follow-Up: Your physician recommends that you schedule a follow-up appointment in: In August with Dr. Domenic Polite    Any Other Special Instructions Will Be Listed Below (If Applicable).     If you need a refill on your cardiac medications before your next appointment, please call your pharmacy.

## 2015-05-29 NOTE — Telephone Encounter (Signed)
-----   Message from Lonn Georgia, PA-C sent at 05/29/2015  4:03 PM EDT ----- Echo looks fine, normal systolic and diastolic function. Her aortic valve is the cause of her heart murmur, but is not bad. Her BMET is OK, cut the Lasix and K+ back to daily as needed for increased DOE, weight gain.

## 2015-09-12 ENCOUNTER — Telehealth: Payer: Self-pay | Admitting: Cardiology

## 2015-09-12 NOTE — Telephone Encounter (Signed)
Called patient. No answer. Left message to call back.  

## 2015-09-15 ENCOUNTER — Ambulatory Visit: Payer: BLUE CROSS/BLUE SHIELD | Admitting: Cardiology

## 2015-09-16 NOTE — Telephone Encounter (Signed)
Mailed pt letter

## 2015-09-16 NOTE — Telephone Encounter (Signed)
Returned pt call, to let her know she does not need blood work prior to her appointment with Dr. Domenic Polite. (09/26/15)

## 2015-09-24 ENCOUNTER — Encounter: Payer: Self-pay | Admitting: *Deleted

## 2015-09-26 ENCOUNTER — Ambulatory Visit (INDEPENDENT_AMBULATORY_CARE_PROVIDER_SITE_OTHER): Payer: BLUE CROSS/BLUE SHIELD | Admitting: Cardiology

## 2015-09-26 ENCOUNTER — Encounter: Payer: Self-pay | Admitting: Cardiology

## 2015-09-26 VITALS — BP 138/64 | HR 80 | Ht 64.0 in | Wt 249.0 lb

## 2015-09-26 DIAGNOSIS — I350 Nonrheumatic aortic (valve) stenosis: Secondary | ICD-10-CM | POA: Diagnosis not present

## 2015-09-26 DIAGNOSIS — Z79899 Other long term (current) drug therapy: Secondary | ICD-10-CM | POA: Diagnosis not present

## 2015-09-26 DIAGNOSIS — I2510 Atherosclerotic heart disease of native coronary artery without angina pectoris: Secondary | ICD-10-CM

## 2015-09-26 DIAGNOSIS — I10 Essential (primary) hypertension: Secondary | ICD-10-CM

## 2015-09-26 DIAGNOSIS — E785 Hyperlipidemia, unspecified: Secondary | ICD-10-CM

## 2015-09-26 MED ORDER — METOPROLOL TARTRATE 25 MG PO TABS: 12.5000 mg | ORAL_TABLET | Freq: Two times a day (BID) | ORAL | 3 refills | Status: DC

## 2015-09-26 NOTE — Progress Notes (Signed)
Cardiology Office Note  Date: 09/26/2015   ID: [name], DOB 05-Feb-1955, MRN [id number]  PCP: Monico Blitz, MD  Primary Cardiologist: Rozann Lesches, MD   Chief Complaint  Patient presents with  . Coronary Artery Disease    History of Present Illness: [name] is a 60 y.o. female last seen by me in February. Interval visit with [name] NP noted in May. She had been treated with Lasix at that time with weight gain felt to be related to diastolic heart failure. She states that she has been doing reasonably well, no angina symptoms or nitroglycerin use. Weight is up 3 pounds compared to May.  She does complain of fatigue, thinks that it might be related to Lopressor. We discussed cutting the dose in half to see if this makes any difference. Heart rate was not bradycardic today however. She is due for follow-up lipids which will be arranged. She reports compliance with Lipitor.  In October of this year she will complete 1 year after DES placement. She will be able to stop Brilinta at that time.  Past Medical History:  Diagnosis Date  . Aortic stenosis    a. Mild by 2D ECHO 11/17/14  . CAD (coronary artery disease)    a. 10/2014: Ant STEMI s/p DES to dLAD  . Carotid disease, bilateral ([place])    a. Duplex 12/2014: mild-mod atherosclerotic plaque without hemodynamically significant stenosis.  [name] COPD (chronic obstructive pulmonary disease) ([place])   . Essential hypertension   . HLD (hyperlipidemia)   . Morbid obesity ([place])   . Shingles   . Type 2 diabetes mellitus ([place])     Past Surgical History:  Procedure Laterality Date  . ABDOMINAL HYSTERECTOMY    . CARDIAC CATHETERIZATION N/A 11/17/2014   Procedure: Left Heart Cath and Coronary Angiography;  Surgeon: Wellington Hampshire, MD;  Location: [facility];  Service: Cardiovascular;  Laterality: N/A;  . CARDIAC CATHETERIZATION N/A 11/17/2014   Procedure: Coronary Stent Intervention;  Surgeon: Wellington Hampshire, MD;   Location: [facility];  Service: Cardiovascular;  Laterality: N/A;  distal lad 2.25x20 promus  . CHOLECYSTECTOMY    . SPINAL FUSION      Current Outpatient Prescriptions  Medication Sig Dispense Refill  . albuterol (PROVENTIL HFA;VENTOLIN HFA) 108 (90 Base) MCG/ACT inhaler Inhale 2 puffs into the lungs every 4 (four) hours as needed for wheezing or shortness of breath. 1 Inhaler 0  . albuterol (PROVENTIL) (2.5 MG/3ML) 0.083% nebulizer solution Take 3 mLs (2.5 mg total) by nebulization every 4 (four) hours as needed for wheezing or shortness of breath. 75 mL 0  . aspirin EC 81 MG tablet Take 81 mg by mouth daily.    [name] atorvastatin (LIPITOR) 10 MG tablet Take 1 tablet (10 mg total) by mouth daily. 90 tablet 3  . escitalopram (LEXAPRO) 20 MG tablet Take 20 mg by mouth daily.  2  . furosemide (LASIX) 20 MG tablet Take 1 tablet (20 mg total) by mouth daily. 90 tablet 3  . insulin detemir (LEVEMIR FLEXPEN) 100 UNIT/ML injection Inject 60 Units into the skin 2 (two) times daily.     [name] JANUVIA 100 MG tablet Take 100 mg by mouth daily.    [name] JARDIANCE 25 MG TABS tablet Take 25 mg by mouth daily.  2  . lisinopril (PRINIVIL,ZESTRIL) 10 MG tablet Take 1 tablet (10 mg total) by mouth daily. 90 tablet 3  . metFORMIN (GLUCOPHAGE) 1000 MG tablet Take 1,000 mg  by mouth 2 (two) times daily with a meal.  6  . potassium chloride SA (KLOR-CON M20) 20 MEQ tablet Take 1 tablet (20 mEq total) by mouth daily. 90 tablet 3  . predniSONE (DELTASONE) 20 MG tablet Take 2 tablets (40 mg total) by mouth daily. 10 tablet 0  . ticagrelor (BRILINTA) 90 MG TABS tablet Take 1 tablet (90 mg total) by mouth 2 (two) times daily. 180 tablet 3  . UNIFINE PENTIPS 31G X 8 MM MISC USE AS DIRECTED 10 TIMES A DAY  4  . metoprolol tartrate (LOPRESSOR) 25 MG tablet Take 0.5 tablets (12.5 mg total) by mouth 2 (two) times daily. 90 tablet 3   No current facility-administered medications for this visit.    Allergies:  Codeine; Sulfa  antibiotics; and Sulfur   Social History: The patient  reports that she has been smoking Cigarettes.  She has been smoking about 0.50 packs per day. She has never used smokeless tobacco. She reports that she does not drink alcohol or use drugs.   ROS:  Please see the history of present illness. Otherwise, complete review of systems is positive for fatigue.  All other systems are reviewed and negative.   Physical Exam: VS:  BP 138/64   Pulse 80   Ht 5\' 4"  (1.626 m)   Wt 249 lb (112.9 kg)   SpO2 95%   BMI 42.74 kg/m , BMI Body mass index is 42.74 kg/m.  Wt Readings from Last 3 Encounters:  09/26/15 249 lb (112.9 kg)  05/29/15 246 lb (111.6 kg)  05/22/15 258 lb (117 kg)    General: Obese woman, appears comfortable at rest. HEENT: Conjunctiva and lids normal, oropharynx clear with moist mucosa. Neck: Supple, no elevated JVP or carotid bruits, no thyromegaly. Lungs: Clear to auscultation, nonlabored breathing at rest. Cardiac: Regular rate and rhythm, no S3, 2/6 basal systolic murmur, no pericardial rub. Abdomen: Soft, nontender, bowel sounds present, no guarding or rebound. Extremities: No pitting edema, distal pulses 2+. Skin: Warm and dry. Musculoskeletal: No kyphosis. Neuropsychiatric: Alert and oriented x3, affect grossly appropriate.  ECG: I personally reviewed the tracing from 05/17/2015 which showed sinus rhythm with borderline low voltage and nonspecific T-wave changes.  Recent Labwork: 01/14/2015: TSH 2.063 01/21/2015: ALT 26; AST 14 05/17/2015: B Natriuretic Peptide 177.0; Hemoglobin 9.8; Platelets 352 05/27/2015: BUN 22; Creatinine, Ser 0.96; Potassium 4.3; Sodium 140     Component Value Date/Time   CHOL 83 (L) 01/21/2015 1025   TRIG 109 01/21/2015 1025   HDL 31 (L) 01/21/2015 1025   CHOLHDL 2.7 01/21/2015 1025   VLDL 22 01/21/2015 1025   LDLCALC 30 01/21/2015 1025    Other Studies Reviewed Today:  Echocardiogram 11/17/2014: Study Conclusions  - Left  ventricle: The cavity size was normal. Wall thickness was normal. Systolic function was normal. The estimated ejection fraction was in the range of 60% to 65%. Wall motion was normal; there were no regional wall motion abnormalities. Left ventricular diastolic function parameters were normal. - Aortic valve: There was very mild stenosis. Valve area (VTI): 2.05 cm^2. Valve area (Vmax): 1.89 cm^2. Valve area (Vmean): 1.91 cm^2. - Pulmonary arteries: Systolic pressure was mildly increased. PA peak pressure: 35 mm Hg (S).  Cardiac catheterization 11/17/2014:  Prox Cx to Mid Cx lesion, 20% stenosed.  Mid Cx lesion, 30% stenosed.  Dist LAD lesion, 100% stenosed. Post intervention, there is a 0% residual stenosis.  Mid RCA lesion, 30% stenosed.  The left ventricular systolic function is normal.  1.  Severe one-vessel coronary artery disease with an occluded distal LAD. 2. Normal LV systolic function with apical akinesis. 3. Successful angioplasty and drug-eluting stent placement to the distal LAD.  Assessment and Plan:  1. CAD status post anterior STEMI with DES to the LAD in October 2016. She is clinically stable without active angina. She will be able to stop Brilinta after October of this year. We plan to reduce her Lopressor dose to see if this helps with fatigue. Otherwise continue observation.  2. Mild aortic stenosis, asymptomatic without change in murmur.  3. Essential hypertension, blood pressure is adequately controlled.  4. Hyperlipidemia, on low-dose Lipitor. Follow-up FLP and LFTs.  Current medicines were reviewed with the patient today.   Orders Placed This Encounter  Procedures  . Hepatic function panel  . Lipid Profile    Disposition: Follow-up with me in 6 months.  Signed, Satira Sark, MD, [facility] 09/26/2015 4:47 PM    [facility] [address], [place], [place] [address] Phone: [phone]; Fax: [phone]

## 2015-09-26 NOTE — Patient Instructions (Signed)
Medication Instructions:  Keep taking Brilinta through October then stop  Decrease Lopressor to 12.5 mg - two times daily    Labwork: Your physician recommends that you return for lab work in:  Liver  Lipid    Testing/Procedures: none  Follow-Up: Your physician wants you to follow-up in: 6 months. You will receive a reminder letter in the mail two months in advance. If you don't receive a letter, please call our office to schedule the follow-up appointment.   Any Other Special Instructions Will Be Listed Below (If Applicable).     If you need a refill on your cardiac medications before your next appointment, please call your pharmacy.

## 2015-10-07 ENCOUNTER — Other Ambulatory Visit: Payer: Self-pay | Admitting: Physician Assistant

## 2015-11-01 ENCOUNTER — Emergency Department (HOSPITAL_COMMUNITY): Payer: BLUE CROSS/BLUE SHIELD

## 2015-11-01 ENCOUNTER — Emergency Department (HOSPITAL_COMMUNITY)
Admission: EM | Admit: 2015-11-01 | Discharge: 2015-11-01 | Disposition: A | Discharge status: Home / Self Care | Payer: BLUE CROSS/BLUE SHIELD | Attending: Emergency Medicine | Admitting: Emergency Medicine

## 2015-11-01 ENCOUNTER — Encounter (HOSPITAL_COMMUNITY): Payer: Self-pay | Admitting: Emergency Medicine

## 2015-11-01 DIAGNOSIS — J449 Chronic obstructive pulmonary disease, unspecified: Secondary | ICD-10-CM | POA: Insufficient documentation

## 2015-11-01 DIAGNOSIS — Z79899 Other long term (current) drug therapy: Secondary | ICD-10-CM | POA: Insufficient documentation

## 2015-11-01 DIAGNOSIS — R05 Cough: Secondary | ICD-10-CM | POA: Diagnosis not present

## 2015-11-01 DIAGNOSIS — E119 Type 2 diabetes mellitus without complications: Secondary | ICD-10-CM | POA: Diagnosis not present

## 2015-11-01 DIAGNOSIS — R059 Cough, unspecified: Secondary | ICD-10-CM

## 2015-11-01 DIAGNOSIS — Z7982 Long term (current) use of aspirin: Secondary | ICD-10-CM | POA: Insufficient documentation

## 2015-11-01 DIAGNOSIS — F17210 Nicotine dependence, cigarettes, uncomplicated: Secondary | ICD-10-CM | POA: Insufficient documentation

## 2015-11-01 DIAGNOSIS — Z794 Long term (current) use of insulin: Secondary | ICD-10-CM | POA: Diagnosis not present

## 2015-11-01 DIAGNOSIS — I10 Essential (primary) hypertension: Secondary | ICD-10-CM | POA: Diagnosis not present

## 2015-11-01 DIAGNOSIS — I2510 Atherosclerotic heart disease of native coronary artery without angina pectoris: Secondary | ICD-10-CM | POA: Insufficient documentation

## 2015-11-01 LAB — CBC WITH DIFFERENTIAL/PLATELET
BASOS ABS: 0 10*3/uL (ref 0.0–0.1)
Basophils Relative: 0 %
Eosinophils Absolute: 0.2 10*3/uL (ref 0.0–0.7)
Eosinophils Relative: 1 %
HCT: 42.5 % (ref 36.0–46.0)
HEMOGLOBIN: 13.5 g/dL (ref 12.0–15.0)
LYMPHS PCT: 12 %
Lymphs Abs: 1.6 10*3/uL (ref 0.7–4.0)
MCH: 28.1 pg (ref 26.0–34.0)
MCHC: 31.8 g/dL (ref 30.0–36.0)
MCV: 88.4 fL (ref 78.0–100.0)
MONO ABS: 0.8 10*3/uL (ref 0.1–1.0)
MONOS PCT: 6 %
NEUTROS PCT: 81 %
Neutro Abs: 10.8 10*3/uL — ABNORMAL HIGH (ref 1.7–7.7)
Platelets: 287 10*3/uL (ref 150–400)
RBC: 4.81 MIL/uL (ref 3.87–5.11)
RDW: 17.4 % — AB (ref 11.5–15.5)
WBC: 13.4 10*3/uL — AB (ref 4.0–10.5)

## 2015-11-01 LAB — BASIC METABOLIC PANEL
ANION GAP: 10 (ref 5–15)
BUN: 23 mg/dL — ABNORMAL HIGH (ref 6–20)
CHLORIDE: 100 mmol/L — AB (ref 101–111)
CO2: 26 mmol/L (ref 22–32)
Calcium: 9 mg/dL (ref 8.9–10.3)
Creatinine, Ser: 0.89 mg/dL (ref 0.44–1.00)
GFR calc Af Amer: >60 mL/min (ref >60)
GFR calc non Af Amer: >60 mL/min (ref >60)
GLUCOSE: 239 mg/dL — AB (ref 65–99)
Potassium: 4.1 mmol/L (ref 3.5–5.1)
Sodium: 136 mmol/L (ref 135–145)

## 2015-11-01 MED ORDER — AZITHROMYCIN 250 MG PO TABS: ORAL_TABLET | ORAL | 0 refills | Status: DC

## 2015-11-01 NOTE — ED Provider Notes (Signed)
[facility] DEPT Provider Note   CSN: [id number] Arrival date & time: 11/01/15  1133  By signing my name below, I, Higinio Plan, attest that this documentation has been prepared under the direction and in the presence of Milton Ferguson, MD . Electronically Signed: Higinio Plan, Scribe. 11/01/2015. 12:02 PM.  History   Chief Complaint Chief Complaint  Patient presents with  . Cough   The history is provided by the patient. No language interpreter was used.  Cough  This is a new problem. The current episode started more than 1 week ago. The problem has been gradually worsening. The cough is productive of sputum. There has been no fever. Pertinent negatives include no chest pain and no headaches. She is a smoker.   HPI Comments: [name] is a 60 y.o. female who presents to the Emergency Department complaining of gradually worsening, productive cough and congestion that began 1 week ago and worsened today. Pt reports her PCP called her in a prescription of Cipro 500 mg BID on 10/1 which she has been taking with mild relief. She states she awoke this morning to worsening symptoms of bilateral ear pain, sore throat and sinus pressure. She denies vomiting. Pt notes she hx of smoking and recurrent ear infections within the past few months that her PCP has treated with antibiotics.   PCP: Dr. Manuella Ghazi   Past Medical History:  Diagnosis Date  . Aortic stenosis    a. Mild by 2D ECHO 11/17/14  . CAD (coronary artery disease)    a. 10/2014: Ant STEMI s/p DES to dLAD  . Carotid disease, bilateral ([place])    a. Duplex 12/2014: mild-mod atherosclerotic plaque without hemodynamically significant stenosis.  [name] COPD (chronic obstructive pulmonary disease) ([place])   . Essential hypertension   . HLD (hyperlipidemia)   . Morbid obesity ([place])   . Shingles   . Type 2 diabetes mellitus [facility])     Patient Active Problem List   Diagnosis Date Noted  . Morbid obesity ([place]) 01/14/2015  . CAD (coronary artery  disease)   . COPD (chronic obstructive pulmonary disease) ([place])   . Hypertension   . DM type 2 (diabetes mellitus, type 2) ([place])   . Tobacco abuse   . HLD (hyperlipidemia)   . Aortic stenosis   . ST elevation (STEMI) myocardial infarction involving left anterior descending coronary artery ([place]) 11/17/2014    Past Surgical History:  Procedure Laterality Date  . ABDOMINAL HYSTERECTOMY    . CARDIAC CATHETERIZATION N/A 11/17/2014   Procedure: Left Heart Cath and Coronary Angiography;  Surgeon: Wellington Hampshire, MD;  Location: [facility];  Service: Cardiovascular;  Laterality: N/A;  . CARDIAC CATHETERIZATION N/A 11/17/2014   Procedure: Coronary Stent Intervention;  Surgeon: Wellington Hampshire, MD;  Location: [place] [facility];  Service: Cardiovascular;  Laterality: N/A;  distal lad 2.25x20 promus  . CHOLECYSTECTOMY    . SPINAL FUSION      OB History    Gravida Para Term Preterm AB Living   1 1 1     1    SAB TAB Ectopic Multiple Live Births                 Home Medications    Prior to Admission medications   Medication Sig Start Date End Date Taking? Authorizing Provider  albuterol (PROVENTIL HFA;VENTOLIN HFA) 108 (90 Base) MCG/ACT inhaler Inhale 2 puffs into the lungs every 4 (four) hours as needed for wheezing or shortness of breath. 05/17/15  Francine Graven, DO  albuterol (PROVENTIL) (2.5 MG/3ML) 0.083% nebulizer solution Take 3 mLs (2.5 mg total) by nebulization every 4 (four) hours as needed for wheezing or shortness of breath. 05/17/15   Francine Graven, DO  aspirin EC 81 MG tablet Take 81 mg by mouth daily.    Historical Provider, MD  atorvastatin (LIPITOR) 10 MG tablet Take 1 tablet (10 mg total) by mouth daily. 03/04/15   Satira Sark, MD  BRILINTA 90 MG TABS tablet TAKE 1 TABLET (90 MG TOTAL) BY MOUTH 2 (TWO) TIMES DAILY. 10/07/15   Satira Sark, MD  escitalopram (LEXAPRO) 20 MG tablet Take 20 mg by mouth daily. 09/02/14   Historical Provider, MD  furosemide  (LASIX) 20 MG tablet Take 1 tablet (20 mg total) by mouth daily. 05/22/15   Rhonda G Barrett, PA-C  insulin detemir (LEVEMIR FLEXPEN) 100 UNIT/ML injection Inject 60 Units into the skin 2 (two) times daily.     Historical Provider, MD  JANUVIA 100 MG tablet Take 100 mg by mouth daily. 10/16/12   Historical Provider, MD  JARDIANCE 25 MG TABS tablet Take 25 mg by mouth daily. 03/08/15   Historical Provider, MD  lisinopril (PRINIVIL,ZESTRIL) 10 MG tablet Take 1 tablet (10 mg total) by mouth daily. 11/26/14   Lendon Colonel, NP  metFORMIN (GLUCOPHAGE) 1000 MG tablet Take 1,000 mg by mouth 2 (two) times daily with a meal. 09/26/14   Historical Provider, MD  metoprolol tartrate (LOPRESSOR) 25 MG tablet Take 0.5 tablets (12.5 mg total) by mouth 2 (two) times daily. 09/26/15 12/25/15  Satira Sark, MD  potassium chloride SA (KLOR-CON M20) 20 MEQ tablet Take 1 tablet (20 mEq total) by mouth daily. 05/22/15   Rhonda G Barrett, PA-C  predniSONE (DELTASONE) 20 MG tablet Take 2 tablets (40 mg total) by mouth daily. 05/17/15   Francine Graven, DO  ticagrelor (BRILINTA) 90 MG TABS tablet Take 1 tablet (90 mg total) by mouth 2 (two) times daily. 11/26/14   Lendon Colonel, NP  UNIFINE PENTIPS 31G X 8 MM MISC USE AS DIRECTED 10 TIMES A DAY 11/04/14   Historical Provider, MD    Family History Family History  Problem Relation Age of Onset  . Heart attack Father   . Heart disease Father     Social History Social History  Substance Use Topics  . Smoking status: Current Some Day Smoker    Packs/day: 0.50    Types: Cigarettes  . Smokeless tobacco: Never Used     Comment: 3 cigs per day  . Alcohol use No   Allergies   Codeine; Sulfa antibiotics; and Sulfur   Review of Systems Review of Systems  Constitutional: Negative for appetite change and fatigue.  HENT: Negative for congestion, ear discharge and sinus pressure.   Eyes: Negative for discharge.  Respiratory: Positive for cough.   Cardiovascular:  Negative for chest pain.  Gastrointestinal: Negative for abdominal pain and diarrhea.  Genitourinary: Negative for frequency and hematuria.  Musculoskeletal: Negative for back pain.  Skin: Negative for rash.  Neurological: Negative for seizures and headaches.  Psychiatric/Behavioral: Negative for hallucinations.   Physical Exam Updated Vital Signs BP (!) 159/49 (BP Location: Left Arm)   Pulse 84   Temp 98.3 F (36.8 C) (Oral)   Resp 16   Ht 5\' 4"  (1.626 m)   Wt 249 lb (112.9 kg)   SpO2 99%   BMI 42.74 kg/m   Physical Exam  Constitutional: She is oriented to person, place,  and time. She appears well-developed.  HENT:  Head: Normocephalic.  Mild inflammation in right TM  Eyes: Conjunctivae and EOM are normal. No scleral icterus.  Neck: Neck supple. No thyromegaly present.  Cardiovascular: Normal rate and regular rhythm.  Exam reveals no gallop and no friction rub.   No murmur heard. Pulmonary/Chest: No stridor. She has wheezes. She has no rales. She exhibits no tenderness.  Minor wheezing bilaterally   Abdominal: She exhibits no distension. There is no tenderness. There is no rebound.  Musculoskeletal: Normal range of motion. She exhibits no edema.  Lymphadenopathy:    She has no cervical adenopathy.  Neurological: She is oriented to person, place, and time. She exhibits normal muscle tone. Coordination normal.  Skin: No rash noted. No erythema.  Psychiatric: She has a normal mood and affect. Her behavior is normal.   ED Treatments / Results  Labs (all labs ordered are listed, but only abnormal results are displayed) Labs Reviewed - No data to display  EKG  EKG Interpretation None       Radiology No results found.  Procedures Procedures (including critical care time)  Medications Ordered in ED Medications - No data to display  DIAGNOSTIC STUDIES:  Oxygen Saturation is 99% on RA, normal by my interpretation.    COORDINATION OF CARE:  11:59 AM Discussed  treatment plan with pt at bedside and pt agreed to plan.  Initial Impression / Assessment and Plan / ED Course  I have reviewed the triage vital signs and the nursing notes.  Pertinent labs & imaging results that were available during my care of the patient were reviewed by me and considered in my medical decision making (see chart for details).  Clinical Course    Patient with persistent bronchitis and sinusitis. She will be put on a Z-Pak and will follow-up with her PCP.j The chart was scribed for me under my direct supervision.  I personally performed the history, physical, and medical decision making and all procedures in the evaluation of this patient..   I personally performed the services described in this documentation, which was scribed in my presence. The recorded information has been reviewed and is accurate.   Final Clinical Impressions(s) / ED Diagnoses   Final diagnoses:  None    New Prescriptions New Prescriptions   No medications on file     Milton Ferguson, MD 11/01/15 1440

## 2015-11-01 NOTE — ED Notes (Signed)
Patient transported to X-ray 

## 2015-11-01 NOTE — Discharge Instructions (Signed)
Follow-up with your family doctor if not improving 

## 2015-11-01 NOTE — ED Triage Notes (Signed)
Patient c/o productive cough with fevers, sore throat, bilateral ear pain, and sinus congestion/pressure x1 week. Per patient was prescribed and has been taking Cipro 500mg  BID since Sunday with no relief. Patient also reports taking tylenol for fevers and generalized body aches-last dose this morning at 6.

## 2015-11-07 ENCOUNTER — Other Ambulatory Visit: Payer: Self-pay | Admitting: Cardiology

## 2015-11-07 LAB — HEPATIC FUNCTION PANEL
ALBUMIN: 3.6 g/dL (ref 3.6–5.1)
ALT: 20 U/L (ref 6–29)
AST: 20 U/L (ref 10–35)
Alkaline Phosphatase: 91 U/L (ref 33–130)
Bilirubin, Direct: 0.1 mg/dL (ref <=0.2)
Indirect Bilirubin: 0.4 mg/dL (ref 0.2–1.2)
TOTAL PROTEIN: 6.7 g/dL (ref 6.1–8.1)
Total Bilirubin: 0.5 mg/dL (ref 0.2–1.2)

## 2015-11-07 LAB — LIPID PANEL
CHOL/HDL RATIO: 2.8 ratio (ref <=5.0)
Cholesterol: 107 mg/dL — ABNORMAL LOW (ref 125–200)
HDL: 38 mg/dL — AB (ref >=46)
LDL Cholesterol: 38 mg/dL (ref <130)
TRIGLYCERIDES: 154 mg/dL — AB (ref <150)
VLDL: 31 mg/dL — ABNORMAL HIGH (ref <30)

## 2015-11-10 ENCOUNTER — Telehealth: Payer: Self-pay | Admitting: *Deleted

## 2015-11-10 NOTE — Telephone Encounter (Signed)
-----   Message from Satira Sark, MD sent at 11/09/2015  8:06 PM EDT ----- Results reviewed. Lipid numbers remain quite low, no major change from prior. A copy of this test should be forwarded to [facility], MD.

## 2015-11-11 NOTE — Telephone Encounter (Signed)
Pt aware - routed to pcp  

## 2015-11-25 ENCOUNTER — Ambulatory Visit (INDEPENDENT_AMBULATORY_CARE_PROVIDER_SITE_OTHER): Payer: BLUE CROSS/BLUE SHIELD | Admitting: "Endocrinology

## 2015-11-25 ENCOUNTER — Encounter: Payer: Self-pay | Admitting: "Endocrinology

## 2015-11-25 VITALS — BP 159/74 | HR 73 | Ht 64.0 in | Wt 246.0 lb

## 2015-11-25 DIAGNOSIS — I10 Essential (primary) hypertension: Secondary | ICD-10-CM

## 2015-11-25 DIAGNOSIS — E782 Mixed hyperlipidemia: Secondary | ICD-10-CM | POA: Diagnosis not present

## 2015-11-25 DIAGNOSIS — E1159 Type 2 diabetes mellitus with other circulatory complications: Secondary | ICD-10-CM

## 2015-11-25 DIAGNOSIS — F17200 Nicotine dependence, unspecified, uncomplicated: Secondary | ICD-10-CM | POA: Diagnosis not present

## 2015-11-25 NOTE — Progress Notes (Signed)
Subjective:    Patient ID: [name], female    DOB: 1955/05/26. Patient is being seen in consultation for management of diabetes requested by  [facility], MD  Past Medical History:  Diagnosis Date  . Aortic stenosis    a. Mild by 2D ECHO 11/17/14  . CAD (coronary artery disease)    a. 10/2014: Ant STEMI s/p DES to dLAD  . Carotid disease, bilateral ([place])    a. Duplex 12/2014: mild-mod atherosclerotic plaque without hemodynamically significant stenosis.  [name] COPD (chronic obstructive pulmonary disease) ([place])   . Essential hypertension   . HLD (hyperlipidemia)   . Morbid obesity ([place])   . Shingles   . Type 2 diabetes mellitus ([place])    Past Surgical History:  Procedure Laterality Date  . ABDOMINAL HYSTERECTOMY    . CARDIAC CATHETERIZATION N/A 11/17/2014   Procedure: Left Heart Cath and Coronary Angiography;  Surgeon: Wellington Hampshire, MD;  Location: [facility];  Service: Cardiovascular;  Laterality: N/A;  . CARDIAC CATHETERIZATION N/A 11/17/2014   Procedure: Coronary Stent Intervention;  Surgeon: Wellington Hampshire, MD;  Location: [facility];  Service: Cardiovascular;  Laterality: N/A;  distal lad 2.25x20 promus  . CHOLECYSTECTOMY    . SPINAL FUSION     Social History   Social History  . Marital status: Married    Spouse name: N/A  . Number of children: N/A  . Years of education: N/A   Occupational History  . Office work for [employer] History Main Topics  . Smoking status: Current Some Day Smoker    Packs/day: 0.50    Types: Cigarettes  . Smokeless tobacco: Never Used     Comment: 3 cigs per day  . Alcohol use No  . Drug use: No  . Sexual activity: Yes    Partners: Male   Other Topics Concern  . None   Social History Narrative  . None   Outpatient Encounter Prescriptions as of 11/25/2015  Medication Sig  . albuterol (PROVENTIL HFA;VENTOLIN HFA) 108 (90 Base) MCG/ACT inhaler Inhale 2 puffs into the lungs every 4  (four) hours as needed for wheezing or shortness of breath.  [name] albuterol (PROVENTIL) (2.5 MG/3ML) 0.083% nebulizer solution Take 3 mLs (2.5 mg total) by nebulization every 4 (four) hours as needed for wheezing or shortness of breath.  [name] aspirin EC 81 MG tablet Take 81 mg by mouth daily.  [name] atorvastatin (LIPITOR) 10 MG tablet Take 1 tablet (10 mg total) by mouth daily.  [name] BRILINTA 90 MG TABS tablet TAKE 1 TABLET (90 MG TOTAL) BY MOUTH 2 (TWO) TIMES DAILY.  [name] escitalopram (LEXAPRO) 20 MG tablet Take 20 mg by mouth daily.  . furosemide (LASIX) 20 MG tablet Take 1 tablet (20 mg total) by mouth daily.  . Insulin Aspart (NOVOLOG FLEXPEN [place]) Inject 35 Units into the skin 3 (three) times daily with meals.  . insulin detemir (LEVEMIR FLEXPEN) 100 UNIT/ML injection Inject 60 Units into the skin at bedtime.  [name] JANUVIA 100 MG tablet Take 100 mg by mouth daily.  [name] JARDIANCE 25 MG TABS tablet Take 25 mg by mouth daily.  [name] lisinopril (PRINIVIL,ZESTRIL) 10 MG tablet Take 1 tablet (10 mg total) by mouth daily.  . metFORMIN (GLUCOPHAGE) 1000 MG tablet Take 1,000 mg by mouth 2 (two) times daily with a meal.  . metoprolol tartrate (LOPRESSOR) 25 MG tablet Take 0.5 tablets (12.5 mg total) by mouth 2 (two) times daily.  [name]  azithromycin (ZITHROMAX Z-PAK) 250 MG tablet 2 po day one, then 1 daily x 4 days (Patient not taking: Reported on 11/25/2015)  . ciprofloxacin (CIPRO) 500 MG tablet Take 500 mg by mouth 2 (two) times daily.  [name] UNIFINE PENTIPS 31G X 8 MM MISC USE AS DIRECTED 10 TIMES A DAY   No facility-administered encounter medications on file as of 11/25/2015.    ALLERGIES: Allergies  Allergen Reactions  . Codeine Nausea And Vomiting  . Sulfa Antibiotics Rash  . Sulfur Rash   VACCINATION STATUS: Immunization History  Administered Date(s) Administered  . Pneumococcal Polysaccharide-23 11/18/2014    Diabetes  She presents for her initial diabetic visit. She has type 2 diabetes mellitus. Onset time: She  was diagnosed at approximate age of 33 years. Her disease course has been worsening. There are no hypoglycemic associated symptoms. Pertinent negatives for hypoglycemia include no confusion, headaches, pallor or seizures. Associated symptoms include blurred vision, fatigue, polydipsia and polyuria. Pertinent negatives for diabetes include no chest pain and no polyphagia. There are no hypoglycemic complications. Symptoms are worsening. Diabetic complications include heart disease. Risk factors for coronary artery disease include diabetes mellitus, dyslipidemia, hypertension, obesity, sedentary lifestyle, tobacco exposure and family history. Current diabetic treatments: She is currently on Levemir 80 units twice a day, NovoLog 35 units 3 times a day before meals, metformin 1000 g by mouth twice a day, Januvia 100 mg by mouth daily, andJardiance 25 mg by mouth daily. Her weight is increasing steadily. She is following a generally unhealthy diet. When asked about meal planning, she reported none. She has not had a previous visit with a dietitian. She never participates in exercise. Home blood sugar record trend: She came with no meter nor logs to review today. She admits that her meter is broken for several weeks and did not use it. An ACE inhibitor/angiotensin II receptor blocker is being taken. Eye exam is current.  Hyperlipidemia  This is a chronic problem. The current episode started more than 1 year ago. Recent lipid tests were reviewed and are variable. Exacerbating diseases include diabetes and obesity. Associated symptoms include shortness of breath. Pertinent negatives include no chest pain or myalgias. Current antihyperlipidemic treatment includes statins. Risk factors for coronary artery disease include dyslipidemia, diabetes mellitus, hypertension, obesity, family history and a sedentary lifestyle.  Hypertension  This is a chronic problem. The current episode started more than 1 year ago. The problem is  uncontrolled. Associated symptoms include blurred vision and shortness of breath. Pertinent negatives include no chest pain, headaches or palpitations. Risk factors for coronary artery disease include dyslipidemia, diabetes mellitus, obesity, sedentary lifestyle and smoking/tobacco exposure. Past treatments include ACE inhibitors.     Review of Systems  Constitutional: Positive for fatigue. Negative for chills, fever and unexpected weight change.  HENT: Negative for trouble swallowing and voice change.   Eyes: Positive for blurred vision. Negative for visual disturbance.  Respiratory: Positive for shortness of breath and wheezing. Negative for cough.   Cardiovascular: Negative for chest pain, palpitations and leg swelling.  Gastrointestinal: Negative for diarrhea, nausea and vomiting.  Endocrine: Positive for polydipsia and polyuria. Negative for cold intolerance, heat intolerance and polyphagia.  Musculoskeletal: Negative for arthralgias and myalgias.  Skin: Negative for color change, pallor, rash and wound.  Neurological: Negative for seizures and headaches.  Psychiatric/Behavioral: Negative for confusion and suicidal ideas.    Objective:    BP (!) 159/74   Pulse 73   Ht 5\' 4"  (1.626 m)   Wt 246  lb (111.6 kg)   BMI 42.23 kg/m   Wt Readings from Last 3 Encounters:  11/25/15 246 lb (111.6 kg)  11/01/15 249 lb (112.9 kg)  09/26/15 249 lb (112.9 kg)    Physical Exam  Constitutional: She is oriented to person, place, and time. She appears well-developed.  HENT:  Head: Normocephalic and atraumatic.  Eyes: EOM are normal.  Neck: Normal range of motion. Neck supple. No tracheal deviation present. No thyromegaly present.  Cardiovascular: Normal rate and regular rhythm.   Pulmonary/Chest: Effort normal. She has wheezes. She has rales.  She has poor air entry on lower lung fields.  Abdominal: Soft. Bowel sounds are normal. There is no tenderness. There is no guarding.   Musculoskeletal: Normal range of motion. She exhibits no edema.  Neurological: She is alert and oriented to person, place, and time. She has normal reflexes. No cranial nerve deficit. Coordination normal.  Skin: Skin is warm and dry. No rash noted. No erythema. No pallor.  Psychiatric: She has a normal mood and affect. Judgment normal.     CMP     Component Value Date/Time   NA 136 11/01/2015 1229   K 4.1 11/01/2015 1229   CL 100 (L) 11/01/2015 1229   CO2 26 11/01/2015 1229   GLUCOSE 239 (H) 11/01/2015 1229   BUN 23 (H) 11/01/2015 1229   CREATININE 0.89 11/01/2015 1229   CREATININE 0.81 01/14/2015 1557   CALCIUM 9.0 11/01/2015 1229   PROT 6.7 11/07/2015 1037   ALBUMIN 3.6 11/07/2015 1037   AST 20 11/07/2015 1037   ALT 20 11/07/2015 1037   ALKPHOS 91 11/07/2015 1037   BILITOT 0.5 11/07/2015 1037   GFRNONAA >60 11/01/2015 1229   GFRAA >60 11/01/2015 1229     Lipid Panel     Component Value Date/Time   CHOL 107 (L) 11/07/2015 1037   TRIG 154 (H) 11/07/2015 1037   HDL 38 (L) 11/07/2015 1037   CHOLHDL 2.8 11/07/2015 1037   VLDL 31 (H) 11/07/2015 1037   LDLCALC 38 11/07/2015 1037     Assessment & Plan:   1. DM type 2 causing vascular disease ([place])  - Patient has currently uncontrolled symptomatic type 2 DM since  60 years of age,  with most recent A1c of 8.4 %. Recent labs reviewed.   Her diabetes is complicated by coronary artery disease requiring stent placement, obese D/sedentary life and patient remains at a high risk for more acute and chronic complications of diabetes which include CAD, CVA, CKD, retinopathy, and neuropathy. These are all discussed in detail with the patient.  - I have counseled the patient on diet management and weight loss, by adopting a carbohydrate restricted/protein rich diet.  - Suggestion is made for patient to avoid simple carbohydrates   from their diet including Cakes , Desserts, Ice Cream,  Soda (  diet and regular) , Sweet Tea ,  Candies,  Chips, Cookies, Artificial Sweeteners,   and "Sugar-free" Products . This will help patient to have stable blood glucose profile and potentially avoid unintended weight gain.  - I encouraged the patient to switch to  unprocessed or minimally processed complex starch and increased protein intake (animal or plant source), fruits, and vegetables.  - Patient is advised to stick to a routine mealtimes to eat 3 meals  a day and avoid unnecessary snacks ( to snack only to correct hypoglycemia).  - The patient will be scheduled with Jearld Fenton, RDN, CDE for individualized DM education.  - I have  approached patient with the following individualized plan to manage diabetes and patient agrees:   - She may need basal/bolus insulin only after assuring her commitment to monitor blood glucose before meals and at bedtime for safe and optimal use of insulin. - At this visit, I urged her to initiate strict monitoring of blood glucose before meals and at bedtime and return in one week with her meter and logs to review and plan long-term treatment regimen. - In the meantime, I  will proceed to readjust her basal insulin Levemir to 60 units QHS, and  Hold her prandial insulin NovoLog until her next visit.  -Patient is encouraged to call clinic for blood glucose levels less than 70 or above 300 mg /dl. - I will continue metformin 1000 g by mouth twice a day, Januvia 100 mg by mouth daily, and Jardiance 25 mg by mouth daily, therapeutically suitable for patient. - His for medications should be more than enough to control her diabetes if this is coupled with appropriate adjustment in her lifestyle  - Patient specific target  A1c;  LDL, HDL, Triglycerides, and  Waist Circumference were discussed in detail.  2) BP/HTN: Uncontrolled. Continue current medications including ACEI/ARB. 3) Lipids/HPL:  Uncontrolled, continue statins. 4)  Weight/Diet: CDE Consult will be initiated , exercise, and detailed  carbohydrates information provided.  5) Chronic Care/Health Maintenance:  -Patient is on ACEI/ARB and Statin medications and encouraged to continue to follow up with Ophthalmology, Podiatrist at least yearly or according to recommendations, and advised to  quit smoking. I have recommended yearly flu vaccine and pneumonia vaccination at least every 5 years; moderate intensity exercise for up to 150 minutes weekly; and  sleep for at least 7 hours a day.  - 60 minutes of time was spent on the care of this patient , 50% of which was applied for counseling on diabetes complications and their preventions.  - Patient to bring meter and  blood glucose logs during their next visit.   - I advised patient to maintain close follow up with [facility], MD for primary care needs.  Follow up plan: - Return in about 1 week (around 12/02/2015) for follow up with meter and logs- no labs.  Glade Lloyd, MD Phone: [phone]  Fax: [phone]   11/25/2015, 8:55 AM

## 2015-11-25 NOTE — Patient Instructions (Signed)
Advice for weight management -For most of us the best way to lose weight is by diet management. Generally speaking, diet management means restricting carbohydrate consumption to minimum possible (and to unprocessed or minimally processed complex starch) and increasing protein intake (animal or plant source), fruits, and vegetables.  -Sticking to a routine mealtime to eat 3 meals a day and avoiding unnecessary snacks is shown to have a big role in weight control.  -It is better to avoid simple carbohydrates including: Cakes, Desserts, Ice Cream, Soda (diet and regular), Sweet Tea, Candies, Chips, Cookies, Artificial Sweeteners, and "Sugar-free" Products.   -Exercise: 30 minutes a day 3-4 days a week, or 150 minutes a week. Combine stretch, strength, and aerobic activities. You may seek evaluation by your heart doctor prior to initiating exercise if you have high risk for heart disease.  -If you are interested, we can schedule a visit with [name], RDN, CDE for individualized nutrition education.  

## 2015-12-02 ENCOUNTER — Ambulatory Visit: Payer: BLUE CROSS/BLUE SHIELD | Admitting: "Endocrinology

## 2015-12-02 ENCOUNTER — Other Ambulatory Visit: Payer: Self-pay | Admitting: Adult Health

## 2015-12-25 ENCOUNTER — Ambulatory Visit: Payer: BLUE CROSS/BLUE SHIELD | Admitting: "Endocrinology

## 2016-03-08 ENCOUNTER — Ambulatory Visit (INDEPENDENT_AMBULATORY_CARE_PROVIDER_SITE_OTHER): Payer: BLUE CROSS/BLUE SHIELD | Admitting: Otolaryngology

## 2016-03-08 DIAGNOSIS — H9201 Otalgia, right ear: Secondary | ICD-10-CM

## 2016-03-08 DIAGNOSIS — H903 Sensorineural hearing loss, bilateral: Secondary | ICD-10-CM

## 2016-04-06 ENCOUNTER — Other Ambulatory Visit: Payer: Self-pay | Admitting: Adult Health

## 2016-04-14 NOTE — Progress Notes (Signed)
Cardiology Office Note  Date: 04/15/2016   ID: Morgan, Aguilar 06-14-1955, MRN [id number]  PCP: Morgan Blitz, MD  Primary Cardiologist: Morgan Lesches, MD   Chief Complaint  Patient presents with  . Coronary Artery Disease    History of Present Illness: [name] is a 61 y.o. female last seen in September 2017. [name] presents for a routine follow-up visit. Reports no angina symptoms or unusual shortness of breath. States that [name] occasionally feels like her heart is racing, but when [name] checks her pulse it is generally in the 60s. [name] has had no syncope.  We reviewed her medications which are outlined below. [name] reports some mild joint achiness even on low-dose Lipitor. We discussed reducing this to every other day for now. Her LDL has been very well controlled on follow-up lab work.  Lopressor remains at 12.5 mg twice daily. [name] seems to be tolerating this.  I personally reviewed her ECG today shows sinus with low voltage and nonspecific T-wave changes.  Past Medical History:  Diagnosis Date  . Aortic stenosis    a. Mild by 2D ECHO 11/17/14  . CAD (coronary artery disease)    a. 10/2014: Ant STEMI s/p DES to dLAD  . Carotid disease, bilateral ([name])    a. Duplex 12/2014: mild-mod atherosclerotic plaque without hemodynamically significant stenosis.  [name] COPD (chronic obstructive pulmonary disease) ([place])   . Essential hypertension   . HLD (hyperlipidemia)   . Morbid obesity ([place])   . Shingles   . Type 2 diabetes mellitus ([place])     Past Surgical History:  Procedure Laterality Date  . ABDOMINAL HYSTERECTOMY    . CARDIAC CATHETERIZATION N/A 11/17/2014   Procedure: Left Heart Cath and Coronary Angiography;  Surgeon: Morgan Hampshire, MD;  Location: [facility];  Service: Cardiovascular;  Laterality: N/A;  . CARDIAC CATHETERIZATION N/A 11/17/2014   Procedure: Coronary Stent Intervention;  Surgeon: Morgan Hampshire, MD;  Location: [facility];  Service:  Cardiovascular;  Laterality: N/A;  distal lad 2.25x20 promus  . CHOLECYSTECTOMY    . SPINAL FUSION      Current Outpatient Prescriptions  Medication Sig Dispense Refill  . albuterol (PROVENTIL HFA;VENTOLIN HFA) 108 (90 Base) MCG/ACT inhaler Inhale 2 puffs into the lungs every 4 (four) hours as needed for wheezing or shortness of breath. 1 Inhaler 0  . albuterol (PROVENTIL) (2.5 MG/3ML) 0.083% nebulizer solution Take 3 mLs (2.5 mg total) by nebulization every 4 (four) hours as needed for wheezing or shortness of breath. 75 mL 0  . aspirin EC 81 MG tablet Take 81 mg by mouth daily.    [name] atorvastatin (LIPITOR) 10 MG tablet Take 1 tablet (10 mg total) by mouth daily. 90 tablet 3  . azithromycin (ZITHROMAX Z-PAK) 250 MG tablet 2 po day one, then 1 daily x 4 days 5 tablet 0  . BRILINTA 90 MG TABS tablet TAKE 1 TABLET (90 MG TOTAL) BY MOUTH 2 (TWO) TIMES DAILY. 60 tablet 1  . ciprofloxacin (CIPRO) 500 MG tablet Take 500 mg by mouth 2 (two) times daily.    [name] escitalopram (LEXAPRO) 20 MG tablet Take 20 mg by mouth daily.  2  . furosemide (LASIX) 20 MG tablet Take 1 tablet (20 mg total) by mouth daily. 90 tablet 3  . Insulin Aspart (NOVOLOG FLEXPEN [place]) Inject 35 Units into the skin 3 (three) times daily with meals.    . insulin detemir (LEVEMIR FLEXPEN) 100 UNIT/ML injection Inject 60 Units  into the skin at bedtime.    . Investigational vitamin D 600 UNITS capsule SWOG T5974 Take 1,000 Units by mouth daily. Take with food.    [name] JANUVIA 100 MG tablet Take 100 mg by mouth daily.    [name] JARDIANCE 25 MG TABS tablet Take 25 mg by mouth daily.  2  . lisinopril (PRINIVIL,ZESTRIL) 10 MG tablet TAKE 1 TABLET (10 MG TOTAL) BY MOUTH DAILY. 90 tablet 1  . metFORMIN (GLUCOPHAGE) 1000 MG tablet Take 1,000 mg by mouth 2 (two) times daily with a meal.  6  . UNIFINE PENTIPS 31G X 8 MM MISC USE AS DIRECTED 10 TIMES A DAY  4  . metoprolol tartrate (LOPRESSOR) 25 MG tablet Take 0.5 tablets (12.5 mg total) by mouth 2 (two)  times daily. 90 tablet 3   No current facility-administered medications for this visit.    Allergies:  Codeine; Sulfa antibiotics; and Sulfur   Social History: The patient  reports that [name] has been smoking Cigarettes.  [name] has been smoking about 0.50 packs per day. [name] has never used smokeless tobacco. [name] reports that [name] does not drink alcohol or use drugs.   ROS:  Please see the history of present illness. Otherwise, complete review of systems is positive for none.  All other systems are reviewed and negative.   Physical Exam: VS:  BP 117/61   Pulse (!) 59   Ht 5\' 4"  (1.626 m)   Wt 239 lb (108.4 kg)   SpO2 91%   BMI 41.02 kg/m , BMI Body mass index is 41.02 kg/m.  Wt Readings from Last 3 Encounters:  04/15/16 239 lb (108.4 kg)  11/25/15 246 lb (111.6 kg)  11/01/15 249 lb (112.9 kg)    General: Obese woman, appears comfortable at rest. HEENT: Conjunctiva and lids normal, oropharynx clear with moist mucosa. Neck: Supple, no elevated JVP or carotid bruits, no thyromegaly. Lungs: Clear to auscultation, nonlabored breathing at rest. Cardiac: Regular rate and rhythm, no S3, 2/6 basal systolic murmur, no pericardial rub. Abdomen: Soft, nontender, bowel sounds present, no guarding or rebound. Extremities: No pitting edema, distal pulses 2+. Skin: Warm and dry. Musculoskeletal: No kyphosis. Neuropsychiatric: Alert and oriented x3, affect grossly appropriate.  ECG: I personally reviewed the tracing from 05/17/2015 which showed sinus rhythm with rightward axis, low voltage, nonspecific T-wave changes.  Recent Labwork: 05/17/2015: B Natriuretic Peptide 177.0 11/01/2015: BUN 23; Creatinine, Ser 0.89; Hemoglobin 13.5; Platelets 287; Potassium 4.1; Sodium 136 11/07/2015: ALT 20; AST 20     Component Value Date/Time   CHOL 107 (L) 11/07/2015 1037   TRIG 154 (H) 11/07/2015 1037   HDL 38 (L) 11/07/2015 1037   CHOLHDL 2.8 11/07/2015 1037   VLDL 31 (H) 11/07/2015 1037   LDLCALC 38  11/07/2015 1037    Other Studies Reviewed Today:  Echocardiogram 05/27/2015: Study Conclusions  - Left ventricle: The cavity size was normal. Wall thickness was   normal. Systolic function was normal. The estimated ejection   fraction was in the range of 60% to 65%. Wall motion was normal;   there were no regional wall motion abnormalities. Left   ventricular diastolic function parameters were normal for the   patient&'s age. - Aortic valve: Trileaflet; mildly calcified leaflets. Moderate   calcification involving the left coronary cusp. There was mild   stenosis. Mean gradient (S): 9 mm Hg. Peak gradient (S): 20 mm   Hg. VTI ratio of LVOT to aortic valve: 0.72. Valve area (VTI):   1.82 cm^2.  Valve area (Vmax): 1.68 cm^2. Valve area (Vmean): 1.76   cm^2. - Mitral valve: There was trivial regurgitation. - Right atrium: Central venous pressure (est): 8 mm Hg. - Tricuspid valve: There was trivial regurgitation. - Pulmonary arteries: PA peak pressure: 41 mm Hg (S). - Pericardium, extracardiac: There was no pericardial effusion.  Impressions:  - Normal LV wall thickness with LVEF 60-65%. Grossly normal   diastolic function with intermediate LV filling pressure. Trivial   mitral regurgitation. Mild aortic stenosis as outlined above.   Trivial tricuspid regurgitation with mildly elevated PASP 41   mmHg.  Assessment and Plan:  1. Symptomatically stable CAD status post anterior STEMI with DES to the LAD in October 2016. [name] completed one year of DAPT. Plan to continue current cardiac regimen.  2. Mixed hyperlipidemia, plan to reduce Lipitor to 10 mg every other day. [name] reports having some mild joint achiness in her last LDL was 38.  3. Mild aortic stenosis, asymptomatic. Continue observation.  4. Essential hypertension, blood pressure is well controlled today.  5. Intermittent sense of palpitations, although patient states that her heart rate has not been specifically elevated  when [name] checks it. States that this is very infrequent. I asked her to let us know if frequency increases and we might consider an outpatient cardiac monitor.  Current medicines were reviewed with the patient today.   Orders Placed This Encounter  Procedures  . EKG 12-Lead    Disposition: Follow-up in 6 months.  Signed, Satira Sark, MD, [facility] 04/15/2016 1:20 PM    [facility] [address], [place], [place] [address] Phone: [phone]; Fax: [phone]

## 2016-04-15 ENCOUNTER — Ambulatory Visit (INDEPENDENT_AMBULATORY_CARE_PROVIDER_SITE_OTHER): Payer: BLUE CROSS/BLUE SHIELD | Admitting: Cardiology

## 2016-04-15 ENCOUNTER — Encounter: Payer: Self-pay | Admitting: Cardiology

## 2016-04-15 VITALS — BP 117/61 | HR 59 | Ht 64.0 in | Wt 239.0 lb

## 2016-04-15 DIAGNOSIS — I350 Nonrheumatic aortic (valve) stenosis: Secondary | ICD-10-CM

## 2016-04-15 DIAGNOSIS — R002 Palpitations: Secondary | ICD-10-CM | POA: Diagnosis not present

## 2016-04-15 DIAGNOSIS — E782 Mixed hyperlipidemia: Secondary | ICD-10-CM

## 2016-04-15 DIAGNOSIS — I10 Essential (primary) hypertension: Secondary | ICD-10-CM

## 2016-04-15 DIAGNOSIS — I2510 Atherosclerotic heart disease of native coronary artery without angina pectoris: Secondary | ICD-10-CM

## 2016-04-15 NOTE — Patient Instructions (Signed)
Medication Instructions:  Take LIPITOR  EVERY OTHER DAY   Labwork: NONE  Testing/Procedures: NONE  Follow-Up: Your physician wants you to follow-up in: 6 MONTHS  You will receive a reminder letter in the mail two months in advance. If you don't receive a letter, please call our office to schedule the follow-up appointment.   Any Other Special Instructions Will Be Listed Below (If Applicable).     If you need a refill on your cardiac medications before your next appointment, please call your pharmacy.

## 2016-04-19 ENCOUNTER — Ambulatory Visit (INDEPENDENT_AMBULATORY_CARE_PROVIDER_SITE_OTHER): Payer: BLUE CROSS/BLUE SHIELD | Admitting: Otolaryngology

## 2016-05-17 ENCOUNTER — Other Ambulatory Visit: Payer: Self-pay | Admitting: Adult Health

## 2016-06-08 ENCOUNTER — Other Ambulatory Visit: Payer: Self-pay | Admitting: Physician Assistant

## 2016-06-08 ENCOUNTER — Other Ambulatory Visit: Payer: Self-pay | Admitting: Cardiology

## 2016-09-08 NOTE — Patient Instructions (Signed)
Your procedure is scheduled on: 09/20/2016   Report to [facility] at  46   AM.  Call this number if you have problems the morning of surgery: [phone]   Do not eat food or drink liquids :After Midnight.      Take these medicines the morning of surgery with A SIP OF WATER: brilinta, lexapro, lisinopril, metoprolol. Use your inhaler and your nebulizer before you come. Take 1/2 of your usual insulin dosage the night before your surgery. DO NOT take any medication for diabetes the morning of your surgery.   Do not wear jewelry, make-up or nail polish.  Do not wear lotions, powders, or perfumes. You may wear deodorant.  Do not shave 48 hours prior to surgery.  Do not bring valuables to the hospital.  Contacts, dentures or bridgework may not be worn into surgery.  Leave suitcase in the car. After surgery it may be brought to your room.  For patients admitted to the hospital, checkout time is 11:00 AM the day of discharge.   Patients discharged the day of surgery will not be allowed to drive home.  :     Please read over the following fact sheets that you were given: Coughing and Deep Breathing, Surgical Site Infection Prevention, Anesthesia Post-op Instructions and Care and Recovery After Surgery    Cataract A cataract is a clouding of the lens of the eye. When a lens becomes cloudy, vision is reduced based on the degree and nature of the clouding. Many cataracts reduce vision to some degree. Some cataracts make people more near-sighted as they develop. Other cataracts increase glare. Cataracts that are ignored and become worse can sometimes look white. The white color can be seen through the pupil. CAUSES   Aging. However, cataracts may occur at any age, even in newborns.   Certain drugs.   Trauma to the eye.   Certain diseases such as diabetes.   Specific eye diseases such as chronic inflammation inside the eye or a sudden attack of a rare form of glaucoma.   Inherited or acquired  medical problems.  SYMPTOMS   Gradual, progressive drop in vision in the affected eye.   Severe, rapid visual loss. This most often happens when trauma is the cause.  DIAGNOSIS  To detect a cataract, an eye doctor examines the lens. Cataracts are best diagnosed with an exam of the eyes with the pupils enlarged (dilated) by drops.  TREATMENT  For an early cataract, vision may improve by using different eyeglasses or stronger lighting. If that does not help your vision, surgery is the only effective treatment. A cataract needs to be surgically removed when vision loss interferes with your everyday activities, such as driving, reading, or watching TV. A cataract may also have to be removed if it prevents examination or treatment of another eye problem. Surgery removes the cloudy lens and usually replaces it with a substitute lens (intraocular lens, IOL).  At a time when both you and your doctor agree, the cataract will be surgically removed. If you have cataracts in both eyes, only one is usually removed at a time. This allows the operated eye to heal and be out of danger from any possible problems after surgery (such as infection or poor wound healing). In rare cases, a cataract may be doing damage to your eye. In these cases, your caregiver may advise surgical removal right away. The vast majority of people who have cataract surgery have better vision afterward. HOME CARE INSTRUCTIONS  If you are not planning surgery, you may be asked to do the following:  Use different eyeglasses.   Use stronger or brighter lighting.   Ask your eye doctor about reducing your medicine dose or changing medicines if it is thought that a medicine caused your cataract. Changing medicines does not make the cataract go away on its own.   Become familiar with your surroundings. Poor vision can lead to injury. Avoid bumping into things on the affected side. You are at a higher risk for tripping or falling.   Exercise  extreme care when driving or operating machinery.   Wear sunglasses if you are sensitive to bright light or experiencing problems with glare.  SEEK IMMEDIATE MEDICAL CARE IF:   You have a worsening or sudden vision loss.   You notice redness, swelling, or increasing pain in the eye.   You have a fever.  Document Released: 01/11/2005 Document Revised: 12/31/2010 Document Reviewed: 09/04/2010 [facility] Patient Information 2012 [place].PATIENT INSTRUCTIONS POST-ANESTHESIA  IMMEDIATELY FOLLOWING SURGERY:  Do not drive or operate machinery for the first twenty four hours after surgery.  Do not make any important decisions for twenty four hours after surgery or while taking narcotic pain medications or sedatives.  If you develop intractable nausea and vomiting or a severe headache please notify your doctor immediately.  FOLLOW-UP:  Please make an appointment with your surgeon as instructed. You do not need to follow up with anesthesia unless specifically instructed to do so.  WOUND CARE INSTRUCTIONS (if applicable):  Keep a dry clean dressing on the anesthesia/puncture wound site if there is drainage.  Once the wound has quit draining you may leave it open to air.  Generally you should leave the bandage intact for twenty four hours unless there is drainage.  If the epidural site drains for more than 36-48 hours please call the anesthesia department.  QUESTIONS?:  Please feel free to call your physician or the hospital operator if you have any questions, and they will be happy to assist you.

## 2016-09-14 ENCOUNTER — Encounter (HOSPITAL_COMMUNITY)
Admission: RE | Admit: 2016-09-14 | Discharge: 2016-09-14 | Disposition: A | Discharge status: Home / Self Care | Payer: BLUE CROSS/BLUE SHIELD | Source: Ambulatory Visit | Attending: Ophthalmology | Admitting: Ophthalmology

## 2016-09-14 ENCOUNTER — Encounter (HOSPITAL_COMMUNITY): Payer: Self-pay

## 2016-09-14 DIAGNOSIS — Z01818 Encounter for other preprocedural examination: Secondary | ICD-10-CM | POA: Insufficient documentation

## 2016-09-14 HISTORY — DX: Nausea with vomiting, unspecified: R11.2

## 2016-09-14 HISTORY — DX: Anxiety disorder, unspecified: F41.9

## 2016-09-14 HISTORY — DX: Other specified postprocedural states: Z98.890

## 2016-09-14 HISTORY — DX: Sleep apnea, unspecified: G47.30

## 2016-09-14 HISTORY — DX: Acute myocardial infarction, unspecified: I21.9

## 2016-09-14 LAB — CBC WITH DIFFERENTIAL/PLATELET
BASOS ABS: 0 10*3/uL (ref 0.0–0.1)
BASOS PCT: 0 %
EOS ABS: 0.2 10*3/uL (ref 0.0–0.7)
Eosinophils Relative: 2 %
HCT: 48.1 % — ABNORMAL HIGH (ref 36.0–46.0)
Hemoglobin: 15.6 g/dL — ABNORMAL HIGH (ref 12.0–15.0)
LYMPHS ABS: 3.2 10*3/uL (ref 0.7–4.0)
LYMPHS PCT: 28 %
MCH: 31 pg (ref 26.0–34.0)
MCHC: 32.4 g/dL (ref 30.0–36.0)
MCV: 95.4 fL (ref 78.0–100.0)
Monocytes Absolute: 0.6 10*3/uL (ref 0.1–1.0)
Monocytes Relative: 5 %
NEUTROS ABS: 7.6 10*3/uL (ref 1.7–7.7)
NEUTROS PCT: 65 %
PLATELETS: 261 10*3/uL (ref 150–400)
RBC: 5.04 MIL/uL (ref 3.87–5.11)
RDW: 14.2 % (ref 11.5–15.5)
WBC: 11.6 10*3/uL — AB (ref 4.0–10.5)

## 2016-09-14 LAB — BASIC METABOLIC PANEL
ANION GAP: 9 (ref 5–15)
BUN: 23 mg/dL — AB (ref 6–20)
CO2: 29 mmol/L (ref 22–32)
Calcium: 9.3 mg/dL (ref 8.9–10.3)
Chloride: 105 mmol/L (ref 101–111)
Creatinine, Ser: 1 mg/dL (ref 0.44–1.00)
GFR calc Af Amer: >60 mL/min (ref >60)
GFR, EST NON AFRICAN AMERICAN: 60 mL/min — AB (ref >60)
GLUCOSE: 170 mg/dL — AB (ref 65–99)
Potassium: 4.1 mmol/L (ref 3.5–5.1)
SODIUM: 143 mmol/L (ref 135–145)

## 2016-09-14 LAB — SURGICAL PCR SCREEN
MRSA, PCR: NEGATIVE
Staphylococcus aureus: POSITIVE — AB

## 2016-09-20 ENCOUNTER — Ambulatory Visit (HOSPITAL_COMMUNITY)
Admission: RE | Admit: 2016-09-20 | Discharge: 2016-09-20 | Disposition: A | Discharge status: Home / Self Care | Payer: BLUE CROSS/BLUE SHIELD | Source: Ambulatory Visit | Attending: Ophthalmology | Admitting: Ophthalmology

## 2016-09-20 ENCOUNTER — Ambulatory Visit (HOSPITAL_COMMUNITY): Payer: BLUE CROSS/BLUE SHIELD | Admitting: Anesthesiology

## 2016-09-20 ENCOUNTER — Encounter (HOSPITAL_COMMUNITY): Payer: Self-pay | Admitting: *Deleted

## 2016-09-20 ENCOUNTER — Encounter (HOSPITAL_COMMUNITY)
Admission: RE | Disposition: A | Discharge status: Home / Self Care | Payer: Self-pay | Source: Ambulatory Visit | Attending: Ophthalmology

## 2016-09-20 DIAGNOSIS — Z79899 Other long term (current) drug therapy: Secondary | ICD-10-CM | POA: Diagnosis not present

## 2016-09-20 DIAGNOSIS — Z7982 Long term (current) use of aspirin: Secondary | ICD-10-CM | POA: Insufficient documentation

## 2016-09-20 DIAGNOSIS — I2510 Atherosclerotic heart disease of native coronary artery without angina pectoris: Secondary | ICD-10-CM | POA: Diagnosis not present

## 2016-09-20 DIAGNOSIS — H25812 Combined forms of age-related cataract, left eye: Secondary | ICD-10-CM | POA: Diagnosis not present

## 2016-09-20 DIAGNOSIS — F17200 Nicotine dependence, unspecified, uncomplicated: Secondary | ICD-10-CM | POA: Insufficient documentation

## 2016-09-20 DIAGNOSIS — G4730 Sleep apnea, unspecified: Secondary | ICD-10-CM | POA: Insufficient documentation

## 2016-09-20 DIAGNOSIS — I10 Essential (primary) hypertension: Secondary | ICD-10-CM | POA: Insufficient documentation

## 2016-09-20 DIAGNOSIS — I252 Old myocardial infarction: Secondary | ICD-10-CM | POA: Diagnosis not present

## 2016-09-20 DIAGNOSIS — Z955 Presence of coronary angioplasty implant and graft: Secondary | ICD-10-CM | POA: Diagnosis not present

## 2016-09-20 DIAGNOSIS — J449 Chronic obstructive pulmonary disease, unspecified: Secondary | ICD-10-CM | POA: Insufficient documentation

## 2016-09-20 DIAGNOSIS — H269 Unspecified cataract: Secondary | ICD-10-CM | POA: Diagnosis present

## 2016-09-20 DIAGNOSIS — Z794 Long term (current) use of insulin: Secondary | ICD-10-CM | POA: Insufficient documentation

## 2016-09-20 HISTORY — PX: CATARACT EXTRACTION W/PHACO: SHX586

## 2016-09-20 LAB — GLUCOSE, CAPILLARY: GLUCOSE-CAPILLARY: 138 mg/dL — AB (ref 65–99)

## 2016-09-20 SURGERY — PHACOEMULSIFICATION, CATARACT, WITH IOL INSERTION
Anesthesia: Monitor Anesthesia Care | Site: Eye | Laterality: Left

## 2016-09-20 MED ORDER — TETRACAINE HCL 0.5 % OP SOLN
1.0000 [drp] | OPHTHALMIC | Status: AC
  Administered 2016-09-20 (×3): 1 [drp] via OPHTHALMIC

## 2016-09-20 MED ORDER — LACTATED RINGERS IV SOLN: INTRAVENOUS | Status: DC

## 2016-09-20 MED ORDER — CYCLOPENTOLATE-PHENYLEPHRINE 0.2-1 % OP SOLN
1.0000 [drp] | OPHTHALMIC | Status: AC
  Administered 2016-09-20 (×3): 1 [drp] via OPHTHALMIC

## 2016-09-20 MED ORDER — MIDAZOLAM HCL 2 MG/2ML IJ SOLN
INTRAMUSCULAR | Status: AC
  Filled 2016-09-20: qty 2

## 2016-09-20 MED ORDER — LIDOCAINE HCL 3.5 % OP GEL
1.0000 "application " | Freq: Once | OPHTHALMIC | Status: AC
  Administered 2016-09-20: 1 via OPHTHALMIC

## 2016-09-20 MED ORDER — POVIDONE-IODINE 5 % OP SOLN
OPHTHALMIC | Status: DC | PRN
  Administered 2016-09-20: 1 via OPHTHALMIC

## 2016-09-20 MED ORDER — PHENYLEPHRINE HCL 2.5 % OP SOLN
1.0000 [drp] | OPHTHALMIC | Status: AC
  Administered 2016-09-20 (×3): 1 [drp] via OPHTHALMIC

## 2016-09-20 MED ORDER — BSS IO SOLN
INTRAOCULAR | Status: DC | PRN
  Administered 2016-09-20: 15 mL

## 2016-09-20 MED ORDER — BSS IO SOLN
INTRAOCULAR | Status: DC | PRN
  Administered 2016-09-20: 500 mL

## 2016-09-20 MED ORDER — PROVISC 10 MG/ML IO SOLN
INTRAOCULAR | Status: DC | PRN
  Administered 2016-09-20: 0.85 mL via INTRAOCULAR

## 2016-09-20 MED ORDER — MIDAZOLAM HCL 2 MG/2ML IJ SOLN
1.0000 mg | Freq: Once | INTRAMUSCULAR | Status: AC | PRN
  Administered 2016-09-20: 2 mg via INTRAVENOUS

## 2016-09-20 MED ORDER — NEOMYCIN-POLYMYXIN-DEXAMETH 3.5-10000-0.1 OP SUSP
OPHTHALMIC | Status: DC | PRN
  Administered 2016-09-20: 2 [drp] via OPHTHALMIC

## 2016-09-20 MED ORDER — LIDOCAINE HCL (PF) 1 % IJ SOLN
INTRAMUSCULAR | Status: DC | PRN
  Administered 2016-09-20: .4 mL

## 2016-09-20 MED ORDER — ONDANSETRON 4 MG PO TBDP
ORAL_TABLET | ORAL | Status: AC
Start: 2016-09-20 — End: ?
  Filled 2016-09-20: qty 1

## 2016-09-20 MED ORDER — ONDANSETRON 4 MG PO TBDP
4.0000 mg | ORAL_TABLET | Freq: Once | ORAL | Status: AC
  Administered 2016-09-20: 4 mg via ORAL

## 2016-09-20 SURGICAL SUPPLY — 10 items

## 2016-09-20 NOTE — Anesthesia Postprocedure Evaluation (Signed)
Anesthesia Post Note  Patient: [name]  Procedure(s) Performed: Procedure(s) (LRB): CATARACT EXTRACTION PHACO AND INTRAOCULAR LENS PLACEMENT ([place]) (Left)  Patient location during evaluation: Short Stay Anesthesia Type: MAC Level of consciousness: awake and alert Pain management: pain level controlled Vital Signs Assessment: post-procedure vital signs reviewed and stable Respiratory status: spontaneous breathing Cardiovascular status: stable Postop Assessment: no signs of nausea or vomiting Anesthetic complications: no     Last Vitals:  Vitals:   09/20/16 0845 09/20/16 0900  BP: 136/73 (!) 161/57  Resp: (!) 32 (!) 21  Temp:    SpO2: 95%     Last Pain:  Vitals:   09/20/16 0808  TempSrc: Oral                 Korinne Greenstein

## 2016-09-20 NOTE — Anesthesia Preprocedure Evaluation (Signed)
Anesthesia Evaluation  Patient identified by MRN, date of birth, ID band Patient awake    History of Anesthesia Complications (+) PONV  Airway Mallampati: I  TM Distance: >3 FB Neck ROM: Full    Dental  (+) Teeth Intact   Pulmonary sleep apnea and Continuous Positive Airway Pressure Ventilation , COPD,  COPD inhaler, Current Smoker,    Pulmonary exam normal breath sounds clear to auscultation       Cardiovascular Exercise Tolerance: Good METS: 5 - 7 Mets hypertension, Pt. on medications + CAD, + Past MI, + Cardiac Stents and + Peripheral Vascular Disease  Normal cardiovascular exam Rhythm:Regular Rate:Normal  Very active, no limitations, no CP, patient states she has not problems since her stent in Oct 2016   Neuro/Psych    GI/Hepatic   Endo/Other  Poorly Controlled, Type 1  Renal/GU      Musculoskeletal   Abdominal (+) + obese,   Peds  Hematology   Anesthesia Other Findings   Reproductive/Obstetrics                             Anesthesia Physical Anesthesia Plan  ASA: IV  Anesthesia Plan: MAC   Post-op Pain Management:    Induction:   PONV Risk Score and Plan: Ondansetron and Midazolam  Airway Management Planned: Natural Airway and Nasal Cannula  Additional Equipment:   Intra-op Plan:   Post-operative Plan:   Informed Consent: I have reviewed the patients History and Physical, chart, labs and discussed the procedure including the risks, benefits and alternatives for the proposed anesthesia with the patient or authorized representative who has indicated his/her understanding and acceptance.   Dental advisory given  Plan Discussed with:   Anesthesia Plan Comments:         Anesthesia Quick Evaluation

## 2016-09-20 NOTE — Discharge Instructions (Signed)

## 2016-09-20 NOTE — Op Note (Signed)
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  Assistants: None  Anesthesia: Topical with MAC  Indications: Painless, progressive loss of vision with compromise of daily activities.  Surgery: Cataract Extraction with Intraocular lens Implant Left Eye  Discription: The patient had dilating drops and viscous lidocaine placed into the Left eye in the pre-op holding area. After transfer to the operating room, a time out was performed. The patient was then prepped and draped. Beginning with a 17m blade a paracentesis port was made at the surgeon's 2 o'clock position. The anterior chamber was then filled with 1% non-preserved lidocaine. This was followed by filling the anterior chamber with Provisc.  A 2.444mkeratome blade was used to make a clear corneal incision at the temporal limbus.  A bent cystatome needle was used to create a continuous tear capsulotomy. Hydrodissection was performed with balanced salt solution on a Fine canula. The lens nucleus was then removed using the phacoemulsification handpiece. Residual cortex was removed with the I&A handpiece. The anterior chamber and capsular bag were refilled with Provisc. A posterior chamber intraocular lens was placed into the capsular bag with it's injector. The implant was positioned with the Kuglan hook. The Provisc was then removed from the anterior chamber and capsular bag with the I&A handpiece. Stromal hydration of the main incision and paracentesis port was performed with BSS on a Fine canula. The wounds were tested for leak which was negative. The patient tolerated the procedure well. There were no operative complications. The patient was then transferred to the recovery room in stable condition.  Complications: None  Specimen: None  EBL: None  Prosthetic device: Abbott Technis, PCB00, power 21.0, SN  [id number]

## 2016-09-20 NOTE — Transfer of Care (Signed)
Immediate Anesthesia Transfer of Care Note  Patient: [name]  Procedure(s) Performed: Procedure(s) with comments: CATARACT EXTRACTION PHACO AND INTRAOCULAR LENS PLACEMENT ([facility]) (Left) - CDE: 9.27  Patient Location: Short Stay  Anesthesia Type:MAC  Level of Consciousness: awake and alert   Airway & Oxygen Therapy: Patient Spontanous Breathing  Post-op Assessment: Report given to RN and Post -op Vital signs reviewed and stable  Post vital signs: Reviewed and stable  Last Vitals:  Vitals:   09/20/16 0845 09/20/16 0900  BP: 136/73 (!) 161/57  Resp: (!) 32 (!) 21  Temp:    SpO2: 95%     Last Pain:  Vitals:   09/20/16 0808  TempSrc: Oral      Patients Stated Pain Goal: 7 (37/16/96 7893)  Complications: No apparent anesthesia complications

## 2016-09-20 NOTE — H&P (Signed)
I have reviewed the H&P, the patient was re-examined, and I have identified no interval changes in medical condition and plan of care since the history and physical of record  

## 2016-09-21 ENCOUNTER — Encounter (HOSPITAL_COMMUNITY): Payer: Self-pay | Admitting: Ophthalmology

## 2016-10-12 ENCOUNTER — Encounter (HOSPITAL_COMMUNITY)
Admission: RE | Admit: 2016-10-12 | Discharge: 2016-10-12 | Disposition: A | Discharge status: Home / Self Care | Payer: BLUE CROSS/BLUE SHIELD | Source: Ambulatory Visit | Attending: Ophthalmology | Admitting: Ophthalmology

## 2016-10-18 ENCOUNTER — Ambulatory Visit (HOSPITAL_COMMUNITY)
Admission: RE | Admit: 2016-10-18 | Discharge: 2016-10-18 | Disposition: A | Discharge status: Home / Self Care | Payer: BLUE CROSS/BLUE SHIELD | Source: Ambulatory Visit | Attending: Ophthalmology | Admitting: Ophthalmology

## 2016-10-18 ENCOUNTER — Ambulatory Visit (HOSPITAL_COMMUNITY): Payer: BLUE CROSS/BLUE SHIELD | Admitting: Anesthesiology

## 2016-10-18 ENCOUNTER — Encounter (HOSPITAL_COMMUNITY): Payer: Self-pay | Admitting: Ophthalmology

## 2016-10-18 ENCOUNTER — Encounter (HOSPITAL_COMMUNITY)
Admission: RE | Disposition: A | Discharge status: Home / Self Care | Payer: Self-pay | Source: Ambulatory Visit | Attending: Ophthalmology

## 2016-10-18 DIAGNOSIS — Z7984 Long term (current) use of oral hypoglycemic drugs: Secondary | ICD-10-CM | POA: Insufficient documentation

## 2016-10-18 DIAGNOSIS — I10 Essential (primary) hypertension: Secondary | ICD-10-CM | POA: Insufficient documentation

## 2016-10-18 DIAGNOSIS — Z7982 Long term (current) use of aspirin: Secondary | ICD-10-CM | POA: Insufficient documentation

## 2016-10-18 DIAGNOSIS — E119 Type 2 diabetes mellitus without complications: Secondary | ICD-10-CM | POA: Insufficient documentation

## 2016-10-18 DIAGNOSIS — Z79899 Other long term (current) drug therapy: Secondary | ICD-10-CM | POA: Insufficient documentation

## 2016-10-18 DIAGNOSIS — I252 Old myocardial infarction: Secondary | ICD-10-CM | POA: Insufficient documentation

## 2016-10-18 DIAGNOSIS — H25811 Combined forms of age-related cataract, right eye: Secondary | ICD-10-CM | POA: Diagnosis not present

## 2016-10-18 DIAGNOSIS — E7800 Pure hypercholesterolemia, unspecified: Secondary | ICD-10-CM | POA: Insufficient documentation

## 2016-10-18 HISTORY — PX: CATARACT EXTRACTION W/PHACO: SHX586

## 2016-10-18 LAB — GLUCOSE, CAPILLARY: GLUCOSE-CAPILLARY: 146 mg/dL — AB (ref 65–99)

## 2016-10-18 SURGERY — PHACOEMULSIFICATION, CATARACT, WITH IOL INSERTION
Anesthesia: Monitor Anesthesia Care | Site: Eye | Laterality: Right

## 2016-10-18 MED ORDER — MIDAZOLAM HCL 2 MG/2ML IJ SOLN
INTRAMUSCULAR | Status: AC
  Filled 2016-10-18: qty 2

## 2016-10-18 MED ORDER — POVIDONE-IODINE 5 % OP SOLN
OPHTHALMIC | Status: DC | PRN
  Administered 2016-10-18: 1 via OPHTHALMIC

## 2016-10-18 MED ORDER — LIDOCAINE HCL (PF) 1 % IJ SOLN
INTRAMUSCULAR | Status: DC | PRN
  Administered 2016-10-18: .5 mL

## 2016-10-18 MED ORDER — FENTANYL CITRATE (PF) 100 MCG/2ML IJ SOLN
INTRAMUSCULAR | Status: AC
  Filled 2016-10-18: qty 2

## 2016-10-18 MED ORDER — NEOMYCIN-POLYMYXIN-DEXAMETH 3.5-10000-0.1 OP SUSP
OPHTHALMIC | Status: DC | PRN
  Administered 2016-10-18: 2 [drp] via OPHTHALMIC

## 2016-10-18 MED ORDER — PHENYLEPHRINE HCL 2.5 % OP SOLN
1.0000 [drp] | OPHTHALMIC | Status: AC
  Administered 2016-10-18 (×3): 1 [drp] via OPHTHALMIC

## 2016-10-18 MED ORDER — EPINEPHRINE PF 1 MG/ML IJ SOLN
INTRAOCULAR | Status: DC | PRN
  Administered 2016-10-18: 500 mL

## 2016-10-18 MED ORDER — CYCLOPENTOLATE-PHENYLEPHRINE 0.2-1 % OP SOLN
1.0000 [drp] | OPHTHALMIC | Status: AC
  Administered 2016-10-18 (×3): 1 [drp] via OPHTHALMIC

## 2016-10-18 MED ORDER — PROVISC 10 MG/ML IO SOLN
INTRAOCULAR | Status: DC | PRN
  Administered 2016-10-18: 0.85 mL via INTRAOCULAR

## 2016-10-18 MED ORDER — BSS IO SOLN
INTRAOCULAR | Status: DC | PRN
  Administered 2016-10-18: 15 mL

## 2016-10-18 MED ORDER — MIDAZOLAM HCL 2 MG/2ML IJ SOLN
1.0000 mg | INTRAMUSCULAR | Status: AC
  Administered 2016-10-18: 2 mg via INTRAVENOUS

## 2016-10-18 MED ORDER — TETRACAINE HCL 0.5 % OP SOLN
1.0000 [drp] | OPHTHALMIC | Status: AC
  Administered 2016-10-18 (×3): 1 [drp] via OPHTHALMIC

## 2016-10-18 MED ORDER — LIDOCAINE HCL 3.5 % OP GEL: 1.0000 "application " | Freq: Once | OPHTHALMIC | Status: DC

## 2016-10-18 MED ORDER — LACTATED RINGERS IV SOLN
INTRAVENOUS | Status: DC
  Administered 2016-10-18: 07:00:00 via INTRAVENOUS

## 2016-10-18 MED ORDER — FENTANYL CITRATE (PF) 100 MCG/2ML IJ SOLN
25.0000 ug | Freq: Once | INTRAMUSCULAR | Status: AC
  Administered 2016-10-18: 25 ug via INTRAVENOUS

## 2016-10-18 SURGICAL SUPPLY — 10 items

## 2016-10-18 NOTE — Anesthesia Procedure Notes (Signed)
Procedure Name: MAC Date/Time: 10/18/2016 7:45 AM Performed by: Vista Deck Pre-anesthesia Checklist: Patient identified, Emergency Drugs available, Suction available, Timeout performed and Patient being monitored Patient Re-evaluated:Patient Re-evaluated prior to induction Oxygen Delivery Method: Nasal Cannula

## 2016-10-18 NOTE — Discharge Instructions (Signed)

## 2016-10-18 NOTE — Progress Notes (Signed)
Cardiology Office Note  Date: 10/19/2016   ID: [name], [name] 1955-11-08, MRN [id number]  PCP: Monico Blitz, MD  Primary Cardiologist: Rozann Lesches, MD   Chief Complaint  Patient presents with  . Coronary Artery Disease    History of Present Illness: [name] is a 61 y.o. female last seen in March. She presents for a routine follow-up visit. Since last encounter she reports no chest pain, stable NYHA class II dyspnea. Still describes occasional feeling of palpitations, this has not progressed. She has had no lightheadedness or syncope.  I reviewed her medications which are outlined below. She reports compliance and no obvious intolerances. She states that she has a visit scheduled to see Dr. Manuella Ghazi with lab work later this month and will have follow-up lipids done at that time. We did talk about reducing the dose of her Lipitor.  Blood pressure is mildly elevated today, higher than at last visit.  Past Medical History:  Diagnosis Date  . Anxiety   . Aortic stenosis    a. Mild by 2D ECHO 11/17/14  . CAD (coronary artery disease)    a. 10/2014: Ant STEMI s/p DES to dLAD  . Carotid disease, bilateral ([place])    a. Duplex 12/2014: mild-mod atherosclerotic plaque without hemodynamically significant stenosis.  [name] COPD (chronic obstructive pulmonary disease) ([place])   . Essential hypertension   . HLD (hyperlipidemia)   . Morbid obesity ([place])   . Myocardial infarction ([place]) 11/17/2014  . PONV (postoperative nausea and vomiting)   . Shingles   . Sleep apnea    uses CPAP  . Type 2 diabetes mellitus ([place])     Past Surgical History:  Procedure Laterality Date  . ABDOMINAL HYSTERECTOMY    . CARDIAC CATHETERIZATION N/A 11/17/2014   Procedure: Left Heart Cath and Coronary Angiography;  Surgeon: Wellington Hampshire, MD;  Location: [facility];  Service: Cardiovascular;  Laterality: N/A;  . CARDIAC CATHETERIZATION N/A 11/17/2014   Procedure: Coronary Stent Intervention;   Surgeon: Wellington Hampshire, MD;  Location: [facility];  Service: Cardiovascular;  Laterality: N/A;  distal lad 2.25x20 promus  . CATARACT EXTRACTION W/PHACO Left 09/20/2016   Procedure: CATARACT EXTRACTION PHACO AND INTRAOCULAR LENS PLACEMENT (IOC);  Surgeon: Tonny Branch, MD;  Location: [facility];  Service: Ophthalmology;  Laterality: Left;  CDE: 9.27  . CHOLECYSTECTOMY    . SPINAL FUSION     cervical; screws and plates.    Current Outpatient Prescriptions  Medication Sig Dispense Refill  . acetaminophen (TYLENOL) 500 MG tablet Take 500 mg by mouth daily as needed for moderate pain or headache.    . albuterol (PROVENTIL HFA;VENTOLIN HFA) 108 (90 Base) MCG/ACT inhaler Inhale 2 puffs into the lungs every 4 (four) hours as needed for wheezing or shortness of breath. 1 Inhaler 0  . albuterol (PROVENTIL) (2.5 MG/3ML) 0.083% nebulizer solution Take 3 mLs (2.5 mg total) by nebulization every 4 (four) hours as needed for wheezing or shortness of breath. 75 mL 0  . aspirin EC 81 MG tablet Take 81 mg by mouth daily.    [name] escitalopram (LEXAPRO) 20 MG tablet Take 20 mg by mouth 2 (two) times daily.   2  . furosemide (LASIX) 20 MG tablet TAKE 1 TABLET (20 MG TOTAL) BY MOUTH DAILY. 90 tablet 3  . insulin aspart (NOVOLOG) 100 UNIT/ML injection Inject 35 Units into the skin 3 (three) times daily between meals.    . insulin detemir (LEVEMIR FLEXPEN) 100  UNIT/ML injection Inject 80 Units into the skin 2 (two) times daily.     [name] JARDIANCE 25 MG TABS tablet Take 25 mg by mouth daily.  2  . lisinopril (PRINIVIL,ZESTRIL) 10 MG tablet TAKE 1 TABLET BY MOUTH EVERY DAY 90 tablet 1  . metFORMIN (GLUCOPHAGE) 1000 MG tablet Take 1,000 mg by mouth 2 (two) times daily.   6  . UNIFINE PENTIPS 31G X 8 MM MISC USE AS DIRECTED 10 TIMES A DAY  4  . Vitamin D, Ergocalciferol, (DRISDOL) 50000 units CAPS capsule Take 50,000 Units by mouth every Saturday.  12  . metoprolol tartrate (LOPRESSOR) 25 MG tablet Take 0.5 tablets (12.5  mg total) by mouth 2 (two) times daily. 90 tablet 3   No current facility-administered medications for this visit.    Allergies:  Codeine and Sulfur   Social History: The patient  reports that she has been smoking Cigarettes.  She has a 7.50 pack-year smoking history. She has never used smokeless tobacco. She reports that she does not drink alcohol or use drugs.   ROS:  Please see the history of present illness. Otherwise, complete review of systems is positive for recent cataract surgery.  All other systems are reviewed and negative.   Physical Exam: VS:  BP (!) 144/66   Pulse 61   Ht 5\' 4"  (1.626 m)   Wt 251 lb (113.9 kg)   SpO2 95%   BMI 43.08 kg/m , BMI Body mass index is 43.08 kg/m.  Wt Readings from Last 3 Encounters:  10/19/16 251 lb (113.9 kg)  10/18/16 249 lb (112.9 kg)  09/14/16 249 lb (112.9 kg)    General: Obese woman, appears comfortable at rest. HEENT: Conjunctiva and lids normal, oropharynx clear. Neck: Supple, no elevated JVP or carotid bruits, no thyromegaly. Lungs: Clear to auscultation, nonlabored breathing at rest. Cardiac: Regular rate and rhythm, no S3, 2/6 systolic murmur, no pericardial rub. Abdomen: Soft, nontender, bowel sounds present, no guarding or rebound. Extremities: No pitting edema, distal pulses 2+. Skin: Warm and dry. Musculoskeletal: No kyphosis. Neuropsychiatric: Alert and oriented x3, affect grossly appropriate.  ECG: I personally reviewed the tracing from 04/15/2016 which showed sinus bradycardia with nonspecific T-wave changes.  Recent Labwork: 11/07/2015: ALT 20; AST 20 09/14/2016: BUN 23; Creatinine, Ser 1.00; Hemoglobin 15.6; Platelets 261; Potassium 4.1; Sodium 143     Component Value Date/Time   CHOL 107 (L) 11/07/2015 1037   TRIG 154 (H) 11/07/2015 1037   HDL 38 (L) 11/07/2015 1037   CHOLHDL 2.8 11/07/2015 1037   VLDL 31 (H) 11/07/2015 1037   LDLCALC 38 11/07/2015 1037    Other Studies Reviewed Today:  Echocardiogram  05/27/2015: Study Conclusions  - Left ventricle: The cavity size was normal. Wall thickness was   normal. Systolic function was normal. The estimated ejection   fraction was in the range of 60% to 65%. Wall motion was normal;   there were no regional wall motion abnormalities. Left   ventricular diastolic function parameters were normal for the   patient&'s age. - Aortic valve: Trileaflet; mildly calcified leaflets. Moderate   calcification involving the left coronary cusp. There was mild   stenosis. Mean gradient (S): 9 mm Hg. Peak gradient (S): 20 mm   Hg. VTI ratio of LVOT to aortic valve: 0.72. Valve area (VTI):   1.82 cm^2. Valve area (Vmax): 1.68 cm^2. Valve area (Vmean): 1.76   cm^2. - Mitral valve: There was trivial regurgitation. - Right atrium: Central venous pressure (est): 8  mm Hg. - Tricuspid valve: There was trivial regurgitation. - Pulmonary arteries: PA peak pressure: 41 mm Hg (S). - Pericardium, extracardiac: There was no pericardial effusion.  Impressions:  - Normal LV wall thickness with LVEF 60-65%. Grossly normal   diastolic function with intermediate LV filling pressure. Trivial   mitral regurgitation. Mild aortic stenosis as outlined above.   Trivial tricuspid regurgitation with mildly elevated PASP 41   mmHg.  Assessment and Plan:  1. CAD status post anterior STEMI with DES to the LAD in October 2016. She is doing well without angina symptoms on medical therapy and we will plan to continue with observation for now.  2. Mixed hyperlipidemia, due for follow-up lipids with Dr. Manuella Ghazi later this month. Plan to reduce Lipitor to 10 mg every other day for now. She has had some joint achiness in her prior LDL was 38.  3. Essential hypertension, blood pressure mildly elevated today. No changes made to current regimen. Keep follow-up with Dr. Manuella Ghazi.  4. Mild aortic stenosis, asymptomatic.  5. Intermittent palpitations, no progression or associated dizziness or  syncope. Continue with observation. I did talk with her about a cardiac monitor if symptoms increase.  Current medicines were reviewed with the patient today.  Disposition: Follow-up in 6 months.  Signed, Satira Sark, MD, [facility] 10/19/2016 11:03 AM    [facility]. [address], [place], [place] [address] Phone: [phone]; Fax: [phone]

## 2016-10-18 NOTE — H&P (Signed)
I have reviewed the H&P, the patient was re-examined, and I have identified no interval changes in medical condition and plan of care since the history and physical of record  

## 2016-10-18 NOTE — Anesthesia Postprocedure Evaluation (Signed)
Anesthesia Post Note  Patient: [name]  Procedure(s) Performed: Procedure(s) (LRB): CATARACT EXTRACTION PHACO AND INTRAOCULAR LENS PLACEMENT (IOC) (Right)  Patient location during evaluation: PACU Anesthesia Type: MAC Level of consciousness: awake and alert Pain management: satisfactory to patient Vital Signs Assessment: post-procedure vital signs reviewed and stable Respiratory status: spontaneous breathing Cardiovascular status: stable Postop Assessment: no apparent nausea or vomiting Anesthetic complications: no     Last Vitals:  Vitals:   10/18/16 0657  Pulse: (!) 57  Resp: 16  Temp: 36.8 C  SpO2: 94%    Last Pain:  Vitals:   10/18/16 0657  TempSrc: Oral                 Kenniel Bergsma

## 2016-10-18 NOTE — Op Note (Signed)
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  Assistants: None  Anesthesia: Topical with MAC  Indications: Painless, progressive loss of vision with compromise of daily activities.  Surgery: Cataract Extraction with Intraocular lens Implant Right Eye  Discription: The patient had dilating drops and viscous lidocaine placed into the Right eye in the pre-op holding area. After transfer to the operating room, a time out was performed. The patient was then prepped and draped. Beginning with a 84m blade a paracentesis port was made at the surgeon's 2 o'clock position. The anterior chamber was then filled with 1% non-preserved lidocaine. This was followed by filling the anterior chamber with Provisc.  A 2.460mkeratome blade was used to make a clear corneal incision at the temporal limbus.  A bent cystatome needle was used to create a continuous tear capsulotomy. Hydrodissection was performed with balanced salt solution on a Fine canula. The lens nucleus was then removed using the phacoemulsification handpiece. Residual cortex was removed with the I&A handpiece. The anterior chamber and capsular bag were refilled with Provisc. A posterior chamber intraocular lens was placed into the capsular bag with it's injector. The implant was positioned with the Kuglan hook. The Provisc was then removed from the anterior chamber and capsular bag with the I&A handpiece. Stromal hydration of the main incision and paracentesis port was performed with BSS on a Fine canula. The wounds were tested for leak which was negative. The patient tolerated the procedure well. There were no operative complications. The patient was then transferred to the recovery room in stable condition.  Complications: None  Specimen: None  EBL: None  Prosthetic device: Abbott Technis, PCB00, power  18.5, SN [id number]

## 2016-10-18 NOTE — Anesthesia Preprocedure Evaluation (Signed)
Anesthesia Evaluation  Patient identified by MRN, date of birth, ID band Patient awake    History of Anesthesia Complications (+) PONV  Airway Mallampati: I  TM Distance: >3 FB Neck ROM: Full    Dental  (+) Teeth Intact   Pulmonary sleep apnea and Continuous Positive Airway Pressure Ventilation , COPD,  COPD inhaler, Current Smoker,    Pulmonary exam normal breath sounds clear to auscultation       Cardiovascular Exercise Tolerance: Good METS: 5 - 7 Mets hypertension, Pt. on medications + CAD, + Past MI, + Cardiac Stents and + Peripheral Vascular Disease  Normal cardiovascular exam Rhythm:Regular Rate:Normal  Very active, no limitations, no CP, patient states she has not problems since her stent in Oct 2016   Neuro/Psych    GI/Hepatic   Endo/Other  diabetes, Poorly Controlled, Type 1  Renal/GU      Musculoskeletal   Abdominal (+) + obese,   Peds  Hematology   Anesthesia Other Findings   Reproductive/Obstetrics                             Anesthesia Physical Anesthesia Plan  ASA: IV  Anesthesia Plan: MAC   Post-op Pain Management:    Induction:   PONV Risk Score and Plan: Ondansetron and Midazolam  Airway Management Planned: Natural Airway and Nasal Cannula  Additional Equipment:   Intra-op Plan:   Post-operative Plan:   Informed Consent: I have reviewed the patients History and Physical, chart, labs and discussed the procedure including the risks, benefits and alternatives for the proposed anesthesia with the patient or authorized representative who has indicated his/her understanding and acceptance.   Dental advisory given  Plan Discussed with:   Anesthesia Plan Comments:         Anesthesia Quick Evaluation

## 2016-10-18 NOTE — Transfer of Care (Signed)
Immediate Anesthesia Transfer of Care Note  Patient: [name]  Procedure(s) Performed: Procedure(s) with comments: CATARACT EXTRACTION PHACO AND INTRAOCULAR LENS PLACEMENT (IOC) (Right) - CDE: 6.38  Patient Location: PACU  Anesthesia Type:MAC  Level of Consciousness: awake and alert   Airway & Oxygen Therapy: Patient Spontanous Breathing  Post-op Assessment: Report given to RN and Post -op Vital signs reviewed and stable  Post vital signs: Reviewed and stable  Last Vitals:  Vitals:   10/18/16 0657  Pulse: (!) 57  Resp: 16  Temp: 36.8 C  SpO2: 94%    Last Pain:  Vitals:   10/18/16 0657  TempSrc: Oral      Patients Stated Pain Goal: 5 (17/35/67 0141)  Complications: No apparent anesthesia complications

## 2016-10-19 ENCOUNTER — Encounter: Payer: Self-pay | Admitting: Cardiology

## 2016-10-19 ENCOUNTER — Other Ambulatory Visit: Payer: Self-pay | Admitting: Cardiology

## 2016-10-19 ENCOUNTER — Ambulatory Visit (INDEPENDENT_AMBULATORY_CARE_PROVIDER_SITE_OTHER): Payer: BLUE CROSS/BLUE SHIELD | Admitting: Cardiology

## 2016-10-19 VITALS — BP 144/66 | HR 61 | Ht 64.0 in | Wt 251.0 lb

## 2016-10-19 DIAGNOSIS — R002 Palpitations: Secondary | ICD-10-CM

## 2016-10-19 DIAGNOSIS — I2510 Atherosclerotic heart disease of native coronary artery without angina pectoris: Secondary | ICD-10-CM | POA: Diagnosis not present

## 2016-10-19 DIAGNOSIS — I350 Nonrheumatic aortic (valve) stenosis: Secondary | ICD-10-CM

## 2016-10-19 DIAGNOSIS — E782 Mixed hyperlipidemia: Secondary | ICD-10-CM | POA: Diagnosis not present

## 2016-10-19 DIAGNOSIS — I10 Essential (primary) hypertension: Secondary | ICD-10-CM | POA: Diagnosis not present

## 2016-10-19 MED ORDER — ATORVASTATIN CALCIUM 10 MG PO TABS: 10.0000 mg | ORAL_TABLET | ORAL | 3 refills | Status: DC

## 2016-10-19 NOTE — Patient Instructions (Addendum)
Your physician wants you to follow-up in:  6 months with Dr Ferne Reus will receive a reminder letter in the mail two months in advance. If you don't receive a letter, please call our office to schedule the follow-up appointment.     Take Atorvastatin 10 mg every other day     All other meds stay the same    No lab work or testing ordered today       Thank you for choosing [facility] !

## 2016-11-27 ENCOUNTER — Other Ambulatory Visit: Payer: Self-pay | Admitting: Cardiology

## 2017-04-06 ENCOUNTER — Emergency Department (HOSPITAL_COMMUNITY)
Admission: EM | Admit: 2017-04-06 | Discharge: 2017-04-06 | Disposition: A | Discharge status: Home / Self Care | Payer: BLUE CROSS/BLUE SHIELD | Attending: Emergency Medicine | Admitting: Emergency Medicine

## 2017-04-06 ENCOUNTER — Other Ambulatory Visit: Payer: Self-pay

## 2017-04-06 ENCOUNTER — Emergency Department (HOSPITAL_COMMUNITY): Payer: BLUE CROSS/BLUE SHIELD

## 2017-04-06 ENCOUNTER — Encounter (HOSPITAL_COMMUNITY): Payer: Self-pay | Admitting: Emergency Medicine

## 2017-04-06 DIAGNOSIS — I10 Essential (primary) hypertension: Secondary | ICD-10-CM | POA: Diagnosis not present

## 2017-04-06 DIAGNOSIS — Y9301 Activity, walking, marching and hiking: Secondary | ICD-10-CM | POA: Insufficient documentation

## 2017-04-06 DIAGNOSIS — Z79899 Other long term (current) drug therapy: Secondary | ICD-10-CM | POA: Diagnosis not present

## 2017-04-06 DIAGNOSIS — S46911A Strain of unspecified muscle, fascia and tendon at shoulder and upper arm level, right arm, initial encounter: Secondary | ICD-10-CM | POA: Insufficient documentation

## 2017-04-06 DIAGNOSIS — E119 Type 2 diabetes mellitus without complications: Secondary | ICD-10-CM | POA: Diagnosis not present

## 2017-04-06 DIAGNOSIS — Z794 Long term (current) use of insulin: Secondary | ICD-10-CM | POA: Diagnosis not present

## 2017-04-06 DIAGNOSIS — I2510 Atherosclerotic heart disease of native coronary artery without angina pectoris: Secondary | ICD-10-CM | POA: Insufficient documentation

## 2017-04-06 DIAGNOSIS — Y929 Unspecified place or not applicable: Secondary | ICD-10-CM | POA: Diagnosis not present

## 2017-04-06 DIAGNOSIS — J449 Chronic obstructive pulmonary disease, unspecified: Secondary | ICD-10-CM | POA: Diagnosis not present

## 2017-04-06 DIAGNOSIS — F17210 Nicotine dependence, cigarettes, uncomplicated: Secondary | ICD-10-CM | POA: Diagnosis not present

## 2017-04-06 DIAGNOSIS — Y999 Unspecified external cause status: Secondary | ICD-10-CM | POA: Diagnosis not present

## 2017-04-06 DIAGNOSIS — S4991XA Unspecified injury of right shoulder and upper arm, initial encounter: Secondary | ICD-10-CM | POA: Diagnosis present

## 2017-04-06 DIAGNOSIS — Z7982 Long term (current) use of aspirin: Secondary | ICD-10-CM | POA: Diagnosis not present

## 2017-04-06 DIAGNOSIS — I252 Old myocardial infarction: Secondary | ICD-10-CM | POA: Insufficient documentation

## 2017-04-06 DIAGNOSIS — W109XXA Fall (on) (from) unspecified stairs and steps, initial encounter: Secondary | ICD-10-CM | POA: Diagnosis not present

## 2017-04-06 MED ORDER — TRAMADOL HCL 50 MG PO TABS
50.0000 mg | ORAL_TABLET | Freq: Once | ORAL | Status: AC
  Administered 2017-04-06: 50 mg via ORAL
  Filled 2017-04-06: qty 1

## 2017-04-06 MED ORDER — TRAMADOL HCL 50 MG PO TABS: 50.0000 mg | ORAL_TABLET | Freq: Four times a day (QID) | ORAL | 0 refills | Status: DC | PRN

## 2017-04-06 NOTE — Discharge Instructions (Signed)
Apply ice packs on/off.  You may also want to take ibuprofen 600 mg 3 times a day for  3-5 days.  Call Dr. Ruthe Mannan office to arrange a follow-up appt for next week if not improving

## 2017-04-06 NOTE — ED Notes (Signed)
Patient seen and evaluated by EDPa for initial assessment. 

## 2017-04-06 NOTE — ED Triage Notes (Addendum)
Fall at home down steps 03/28/17. Rt upper arm pain radiating to neck and fingertips. Patient was driving to work and began to have pain with lifting arm.

## 2017-04-06 NOTE — ED Provider Notes (Signed)
[facility] EMERGENCY DEPARTMENT Provider Note   CSN: [id number] Arrival date & time: 04/06/17  1446     History   Chief Complaint Chief Complaint  Patient presents with  . Shoulder Pain    HPI [name] is a 62 y.o. female.  HPI  [name] is a 62 y.o. female who presents to the Emergency Department complaining of pain to her right shoulder since a mechanical fall on 03/28/2017.  She states that she slipped and fell down 2 steps while taking out the trash.  She describes a mild aching pain to her shoulder associated with movement since her fall, but today she states the pain became much worse and radiated down her right arm and into her right side of her neck.  Pain is still associated with movement.  Improves with her right arm held to her chest.  She denies head injury, headaches, vomiting, visual change, chest pain, shortness of breath, numbness or weakness of the upper extremities and swelling of the joint.  She has taken Tylenol with minimal to no relief.    Past Medical History:  Diagnosis Date  . Anxiety   . Aortic stenosis    a. Mild by 2D ECHO 11/17/14  . CAD (coronary artery disease)    a. 10/2014: Ant STEMI s/p DES to dLAD  . Carotid disease, bilateral ([place])    a. Duplex 12/2014: mild-mod atherosclerotic plaque without hemodynamically significant stenosis.  [name] COPD (chronic obstructive pulmonary disease) ([place])   . Essential hypertension   . HLD (hyperlipidemia)   . Morbid obesity ([place])   . Myocardial infarction ([place]) 11/17/2014  . PONV (postoperative nausea and vomiting)   . Shingles   . Sleep apnea    uses CPAP  . Type 2 diabetes mellitus [facility])     Patient Active Problem List   Diagnosis Date Noted  . Morbid obesity ([place]) 01/14/2015  . CAD (coronary artery disease)   . COPD (chronic obstructive pulmonary disease) ([place])   . Hypertension   . DM type 2 causing vascular disease ([place])   . Current smoker   . HLD (hyperlipidemia)   . Aortic stenosis    . ST elevation (STEMI) myocardial infarction involving left anterior descending coronary artery ([place]) 11/17/2014    Past Surgical History:  Procedure Laterality Date  . ABDOMINAL HYSTERECTOMY    . CARDIAC CATHETERIZATION N/A 11/17/2014   Procedure: Left Heart Cath and Coronary Angiography;  Surgeon: Wellington Hampshire, MD;  Location: [place] CV LAB;  Service: Cardiovascular;  Laterality: N/A;  . CARDIAC CATHETERIZATION N/A 11/17/2014   Procedure: Coronary Stent Intervention;  Surgeon: Wellington Hampshire, MD;  Location: [place] CV LAB;  Service: Cardiovascular;  Laterality: N/A;  distal lad 2.25x20 promus  . CATARACT EXTRACTION W/PHACO Left 09/20/2016   Procedure: CATARACT EXTRACTION PHACO AND INTRAOCULAR LENS PLACEMENT (IOC);  Surgeon: Tonny Branch, MD;  Location: [facility];  Service: Ophthalmology;  Laterality: Left;  CDE: 9.27  . CATARACT EXTRACTION W/PHACO Right 10/18/2016   Procedure: CATARACT EXTRACTION PHACO AND INTRAOCULAR LENS PLACEMENT (IOC);  Surgeon: Tonny Branch, MD;  Location: [facility] OR[facility];  Service: Ophthalmology;  Laterality: Right;  CDE: 6.38  . CHOLECYSTECTOMY    . SPINAL FUSION     cervical; screws and plates.    OB History    Gravida Para Term Preterm AB Living   1 1 1     1    SAB TAB Ectopic Multiple Live Births  Home Medications    Prior to Admission medications   Medication Sig Start Date End Date Taking? Authorizing Provider  acetaminophen (TYLENOL) 500 MG tablet Take 500 mg by mouth daily as needed for moderate pain or headache.    [provider]  albuterol (PROVENTIL HFA;VENTOLIN HFA) 108 (90 Base) MCG/ACT inhaler Inhale 2 puffs into the lungs every 4 (four) hours as needed for wheezing or shortness of breath. 05/17/15   Francine Graven, DO  albuterol (PROVENTIL) (2.5 MG/3ML) 0.083% nebulizer solution Take 3 mLs (2.5 mg total) by nebulization every 4 (four) hours as needed for wheezing or shortness of breath. 05/17/15   Francine Graven,  DO  aspirin EC 81 MG tablet Take 81 mg by mouth daily.    [provider]  atorvastatin (LIPITOR) 10 MG tablet Take 1 tablet (10 mg total) by mouth every other day. Take at dinner 10/19/16 01/17/17  Satira Sark, MD  escitalopram (LEXAPRO) 20 MG tablet Take 20 mg by mouth 2 (two) times daily.  09/02/14   [provider]  furosemide (LASIX) 20 MG tablet TAKE 1 TABLET (20 MG TOTAL) BY MOUTH DAILY. 06/08/16   Satira Sark, MD  insulin aspart (NOVOLOG) 100 UNIT/ML injection Inject 35 Units into the skin 3 (three) times daily between meals.    [provider]  insulin detemir (LEVEMIR FLEXPEN) 100 UNIT/ML injection Inject 80 Units into the skin 2 (two) times daily.     [provider]  JARDIANCE 25 MG TABS tablet Take 25 mg by mouth daily. 03/08/15   [provider]  lisinopril (PRINIVIL,ZESTRIL) 10 MG tablet TAKE 1 TABLET BY MOUTH EVERY DAY 11/29/16   Satira Sark, MD  metFORMIN (GLUCOPHAGE) 1000 MG tablet Take 1,000 mg by mouth 2 (two) times daily.  09/26/14   [provider]  metoprolol tartrate (LOPRESSOR) 25 MG tablet TAKE 1/2 TABLET BY MOUTH TWICE A DAY 10/19/16   Satira Sark, MD  UNIFINE PENTIPS 31G X 8 MM MISC USE AS DIRECTED 10 TIMES A DAY 11/04/14   [provider]  Vitamin D, Ergocalciferol, (DRISDOL) 50000 units CAPS capsule Take 50,000 Units by mouth every Saturday. 08/30/16   [provider]    Family History Family History  Problem Relation Age of Onset  . Heart attack Father   . Heart disease Father     Social History Social History   Tobacco Use  . Smoking status: Current Some Day Smoker    Packs/day: 0.25    Years: 30.00    Pack years: 7.50    Types: Cigarettes  . Smokeless tobacco: Never Used  . Tobacco comment: 3 cigs per day  Substance Use Topics  . Alcohol use: No    Alcohol/week: 0.0 oz  . Drug use: No     Allergies   Codeine and Sulfur   Review of Systems Review of  Systems  Constitutional: Negative for chills and fever.  Respiratory: Negative for shortness of breath.   Cardiovascular: Negative for chest pain.  Gastrointestinal: Negative for abdominal pain, nausea and vomiting.  Genitourinary: Negative for dysuria and flank pain.  Musculoskeletal: Positive for arthralgias (Right shoulder pain) and neck pain. Negative for back pain and joint swelling.  Skin: Negative for color change and wound.  Neurological: Negative for dizziness, syncope, weakness, numbness and headaches.  Psychiatric/Behavioral: Negative for confusion.  All other systems reviewed and are negative.    Physical Exam Updated Vital Signs BP (!) 160/68 (BP Location: Right Arm)  Pulse 70   Temp 98.7 F (37.1 C) (Oral)   Resp 18   Ht 5\' 4"  (1.626 m)   Wt 118.4 kg (261 lb)   SpO2 94%   BMI 44.80 kg/m   Physical Exam  Constitutional: She is oriented to person, place, and time. She appears well-developed and well-nourished. No distress.  HENT:  Head: Atraumatic.  Mouth/Throat: Oropharynx is clear and moist.  Eyes: EOM are normal. Pupils are equal, round, and reactive to light.  Cardiovascular: Normal rate, regular rhythm and intact distal pulses.  No murmur heard. Pulmonary/Chest: Effort normal and breath sounds normal. No respiratory distress. She exhibits no tenderness.  Abdominal: Soft. She exhibits no distension. There is no tenderness. There is no guarding.  Musculoskeletal: She exhibits tenderness.  Focal tenderness to palpation of the anterior right shoulder and AC joint.  Mild tenderness to palpation of the right cervical paraspinal muscles.  No spinal tenderness to palpation or bony step-offs.  Pain reproduced on abduction of the right arm.  No edema.  5 out of 5 motor strength of the bilateral upper extremities.  Neurological: She is alert and oriented to person, place, and time. No sensory deficit.  Skin: Skin is warm. Capillary refill takes less than 2 seconds. No  erythema.  Psychiatric: She has a normal mood and affect.  Nursing note and vitals reviewed.    ED Treatments / Results  Labs (all labs ordered are listed, but only abnormal results are displayed) Labs Reviewed - No data to display  EKG  EKG Interpretation None       Radiology Dg Cervical Spine Complete  Result Date: 04/06/2017 CLINICAL DATA:  Right neck and arm pain after fall week ago. EXAM: CERVICAL SPINE - COMPLETE 4+ VIEW COMPARISON:  Cervical spine x-rays dated April 09, 2011. FINDINGS: The lateral view is diagnostic to the C6 level. Prior C5-C7 ACDF with solid osseous fusion. There is no acute fracture or subluxation. Vertebral body heights are preserved. Straightening of the normal cervical lordosis. Alignment is otherwise normal. Mild-to-moderate degenerative disc disease at C4-C5, slightly progressed when compared to prior study. Neural foramina are widely patent. Normal prevertebral soft tissues. IMPRESSION: 1.  No acute osseous abnormality. 2. Prior C5-C7 ACDF without evidence of hardware complication. 3. Progressed mild to moderate degenerative disc disease at C4-C5. Electronically Signed   By: Titus Dubin M.D.   On: 04/06/2017 16:27   Dg Shoulder Right  Result Date: 04/06/2017 CLINICAL DATA:  Fall.  Sharp pain to top of right shoulder. EXAM: RIGHT SHOULDER - 2+ VIEW COMPARISON:  None. FINDINGS: Degenerative changes in the [facility] joint with joint space narrowing and spurring. Glenohumeral joint is maintained. No acute bony abnormality. Specifically, no fracture, subluxation, or dislocation. Soft tissues are intact. IMPRESSION: Degenerative changes in the right AC joint. No acute bony abnormality. Electronically Signed   By: Rolm Baptise M.D.   On: 04/06/2017 16:25    Procedures Procedures (including critical care time)  Medications Ordered in ED Medications  traMADol (ULTRAM) tablet 50 mg (not administered)     Initial Impression / Assessment and Plan / ED Course  I  have reviewed the triage vital signs and the nursing notes.  Pertinent labs & imaging results that were available during my care of the patient were reviewed by me and considered in my medical decision making (see chart for details).     Mechanical injury to the right shoulder.  XR neg for disloc or fx.  NV intact.  Likely sprain but  also discussed possible rotator cuff injury and need for close orthopedic f/u.  Pt verbalized understanding and agrees to tx plan with ice, ultram and minimize use.    Final Clinical Impressions(s) / ED Diagnoses   Final diagnoses:  Strain of right shoulder, initial encounter    ED Discharge Orders    None       Kem Parkinson, PA-C 04/06/17 [place], [place], DO 04/07/17 2316

## 2017-04-15 NOTE — Progress Notes (Signed)
Cardiology Office Note  Date: 04/18/2017   ID: [name], [name] 1955-12-21, MRN [id number]  PCP: Morgan Blitz, MD  Primary Cardiologist: Morgan Lesches, MD   Chief Complaint  Patient presents with  . Coronary Artery Disease    History of Present Illness: [name] is a 62 y.o. female last seen in September 2018.  She presents for a routine follow-up visit.  Continues to do well, no angina symptoms on medical therapy.  She reports NYHA class II dyspnea, no palpitations or syncope.  Continues to work full-time for a [employer].  We discussed possible follow-up stress testing within the year. She is status post anterior STEMI with DES to the distal LAD in October 2016.  Current cardiac regimen includes aspirin, Lipitor, Lasix, lisinopril, and Lopressor.  I personally reviewed her ECG today which shows sinus bradycardia with nonspecific T wave changes.  Past Medical History:  Diagnosis Date  . Anxiety   . Aortic stenosis    a. Mild by 2D ECHO 11/17/14  . CAD (coronary artery disease)    a. 10/2014: Ant STEMI s/p DES to dLAD  . Carotid disease, bilateral ([place])    a. Duplex 12/2014: mild-mod atherosclerotic plaque without hemodynamically significant stenosis.  [name] COPD (chronic obstructive pulmonary disease) ([place])   . Essential hypertension   . HLD (hyperlipidemia)   . Morbid obesity ([place])   . Myocardial infarction ([place]) 11/17/2014  . PONV (postoperative nausea and vomiting)   . Shingles   . Sleep apnea    uses CPAP  . Type 2 diabetes mellitus ([place])     Past Surgical History:  Procedure Laterality Date  . ABDOMINAL HYSTERECTOMY    . CARDIAC CATHETERIZATION N/A 11/17/2014   Procedure: Left Heart Cath and Coronary Angiography;  Surgeon: Wellington Hampshire, MD;  Location: [place] CV LAB;  Service: Cardiovascular;  Laterality: N/A;  . CARDIAC CATHETERIZATION N/A 11/17/2014   Procedure: Coronary Stent Intervention;  Surgeon: Wellington Hampshire, MD;   Location: [place] CV LAB;  Service: Cardiovascular;  Laterality: N/A;  distal lad 2.25x20 promus  . CATARACT EXTRACTION W/PHACO Left 09/20/2016   Procedure: CATARACT EXTRACTION PHACO AND INTRAOCULAR LENS PLACEMENT (IOC);  Surgeon: Tonny Branch, MD;  Location: [facility];  Service: Ophthalmology;  Laterality: Left;  CDE: 9.27  . CATARACT EXTRACTION W/PHACO Right 10/18/2016   Procedure: CATARACT EXTRACTION PHACO AND INTRAOCULAR LENS PLACEMENT (IOC);  Surgeon: Tonny Branch, MD;  Location: [facility];  Service: Ophthalmology;  Laterality: Right;  CDE: 6.38  . CHOLECYSTECTOMY    . SPINAL FUSION     cervical; screws and plates.    Current Outpatient Medications  Medication Sig Dispense Refill  . acetaminophen (TYLENOL) 500 MG tablet Take 500 mg by mouth daily as needed for moderate pain or headache.    . albuterol (PROVENTIL HFA;VENTOLIN HFA) 108 (90 Base) MCG/ACT inhaler Inhale 2 puffs into the lungs every 4 (four) hours as needed for wheezing or shortness of breath. 1 Inhaler 0  . albuterol (PROVENTIL) (2.5 MG/3ML) 0.083% nebulizer solution Take 3 mLs (2.5 mg total) by nebulization every 4 (four) hours as needed for wheezing or shortness of breath. 75 mL 0  . aspirin EC 81 MG tablet Take 81 mg by mouth daily.    [name] atorvastatin (LIPITOR) 10 MG tablet Take 1 tablet (10 mg total) by mouth every other day. Take at dinner 45 tablet 3  . escitalopram (LEXAPRO) 20 MG tablet Take 20 mg by mouth 2 (two) times daily.  2  . furosemide (LASIX) 20 MG tablet TAKE 1 TABLET (20 MG TOTAL) BY MOUTH DAILY. 90 tablet 3  . insulin aspart (NOVOLOG) 100 UNIT/ML injection Inject 35 Units into the skin 3 (three) times daily between meals.    . insulin detemir (LEVEMIR FLEXPEN) 100 UNIT/ML injection Inject 80 Units into the skin 2 (two) times daily.     [name] JARDIANCE 25 MG TABS tablet Take 25 mg by mouth daily.  2  . lisinopril (PRINIVIL,ZESTRIL) 10 MG tablet TAKE 1 TABLET BY MOUTH EVERY DAY 90 tablet 1  . metFORMIN (GLUCOPHAGE)  1000 MG tablet Take 1,000 mg by mouth 2 (two) times daily.   6  . metoprolol tartrate (LOPRESSOR) 25 MG tablet TAKE 1/2 TABLET BY MOUTH TWICE A DAY 90 tablet 1  . traMADol (ULTRAM) 50 MG tablet Take 1 tablet (50 mg total) by mouth every 6 (six) hours as needed. 15 tablet 0  . UNIFINE PENTIPS 31G X 8 MM MISC USE AS DIRECTED 10 TIMES A DAY  4  . Vitamin D, Ergocalciferol, (DRISDOL) 50000 units CAPS capsule Take 50,000 Units by mouth every Saturday.  12   No current facility-administered medications for this visit.    Allergies:  Codeine and Sulfur   Social History: The patient  reports that she has been smoking cigarettes.  She has a 7.50 pack-year smoking history. She has never used smokeless tobacco. She reports that she does not drink alcohol or use drugs.   ROS:  Please see the history of present illness. Otherwise, complete review of systems is positive for none.  All other systems are reviewed and negative.   Physical Exam: VS:  BP 118/70   Pulse 66   Ht 5\' 4"  (1.626 m)   Wt 254 lb (115.2 kg)   SpO2 93%   BMI 43.60 kg/m , BMI Body mass index is 43.6 kg/m.  Wt Readings from Last 3 Encounters:  04/18/17 254 lb (115.2 kg)  04/06/17 261 lb (118.4 kg)  10/19/16 251 lb (113.9 kg)    General: Patient appears comfortable at rest. HEENT: Conjunctiva and lids normal, oropharynx clear. Neck: Supple, no elevated JVP or carotid bruits, no thyromegaly. Lungs: Clear to auscultation, nonlabored breathing at rest. Cardiac: Regular rate and rhythm, no S3, 2/6 systolic murmur, no pericardial rub. Abdomen: Soft, nontender, bowel sounds present. Extremities: No pitting edema, distal pulses 2+. Skin: Warm and dry. Musculoskeletal: No kyphosis. Neuropsychiatric: Alert and oriented x3, affect grossly appropriate.  ECG: I personally reviewed the tracing from 04/15/2016 which showed sinus bradycardia with nonspecific T-wave changes.  Recent Labwork: 09/14/2016: BUN 23; Creatinine, Ser 1.00;  Hemoglobin 15.6; Platelets 261; Potassium 4.1; Sodium 143   Other Studies Reviewed Today:  Echocardiogram 05/27/2015: Study Conclusions  - Left ventricle: The cavity size was normal. Wall thickness was normal. Systolic function was normal. The estimated ejection fraction was in the range of 60% to 65%. Wall motion was normal; there were no regional wall motion abnormalities. Left ventricular diastolic function parameters were normal for the patient&'s age. - Aortic valve: Trileaflet; mildly calcified leaflets. Moderate calcification involving the left coronary cusp. There was mild stenosis. Mean gradient (S): 9 mm Hg. Peak gradient (S): 20 mm Hg. VTI ratio of LVOT to aortic valve: 0.72. Valve area (VTI): 1.82 cm^2. Valve area (Vmax): 1.68 cm^2. Valve area (Vmean): 1.76 cm^2. - Mitral valve: There was trivial regurgitation. - Right atrium: Central venous pressure (est): 8 mm Hg. - Tricuspid valve: There was trivial regurgitation. - Pulmonary arteries:  PA peak pressure: 41 mm Hg (S). - Pericardium, extracardiac: There was no pericardial effusion.  Impressions:  - Normal LV wall thickness with LVEF 60-65%. Grossly normal diastolic function with intermediate LV filling pressure. Trivial mitral regurgitation. Mild aortic stenosis as outlined above. Trivial tricuspid regurgitation with mildly elevated PASP 41 mmHg.  Assessment and Plan:  1.  CAD status post DES to the LAD in October 2016.  She remains symptomatically stable on medical therapy.  ECG reviewed.  Plan to see her back in 6 months and consider a Lexiscan Myoview at that time.  2.  Essential hypertension, blood pressure well controlled today on current regimen.  3.  Mild aortic stenosis, asymptomatic.  No change on examination.  4.  Mixed hyperlipidemia, she continues on Lipitor with follow-up lab work per Dr. Manuella Ghazi.  Current medicines were reviewed with the patient today.   Orders Placed  This Encounter  Procedures  . EKG 12-Lead    Disposition: Follow-up in 6 months.  Signed, Satira Sark, MD, [facility] 04/18/2017 11:27 AM    [place] [facility] [address], [place], [place] [address] Phone: [phone]; Fax: [phone]

## 2017-04-17 IMAGING — US US EXTREM LOW VENOUS*R*
1 series · 13 of 24 positions shown · non-contrast
Comparison: None.

CLINICAL DATA: 50-year-old female with 3 week history of right
thigh pain and swelling



[Series 1: us extrem low venous*right* · 0.07mm/px · 13 of 42 slices shown]
[im 1/42]
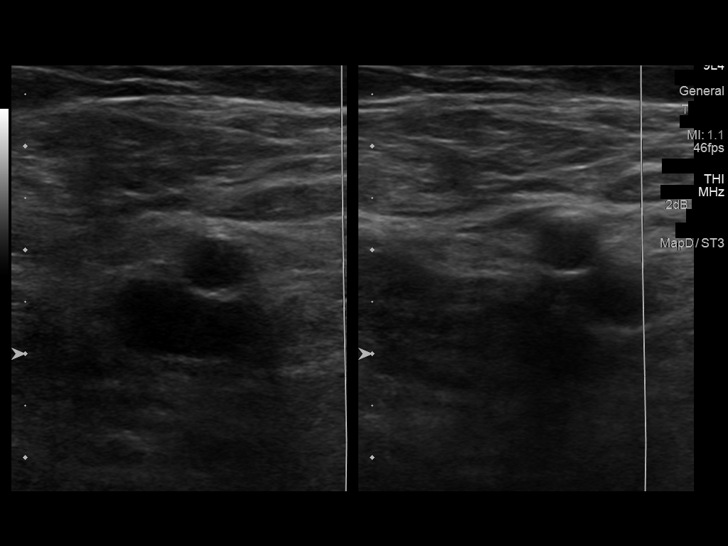
[im 4/42]
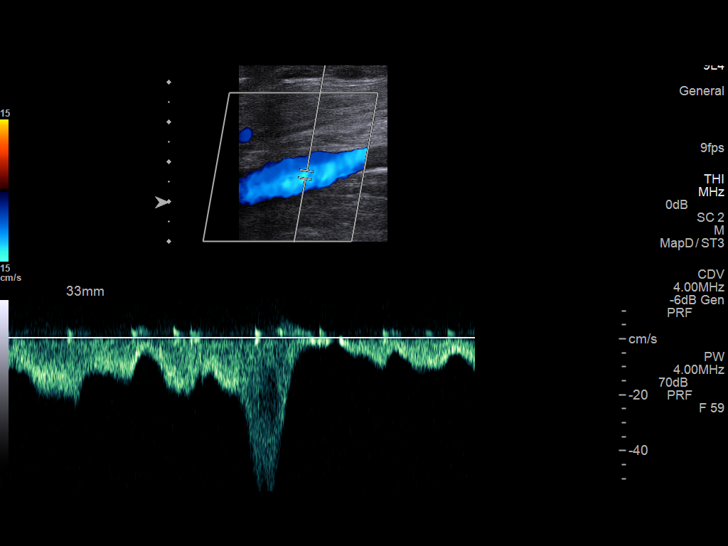
[im 8/42]
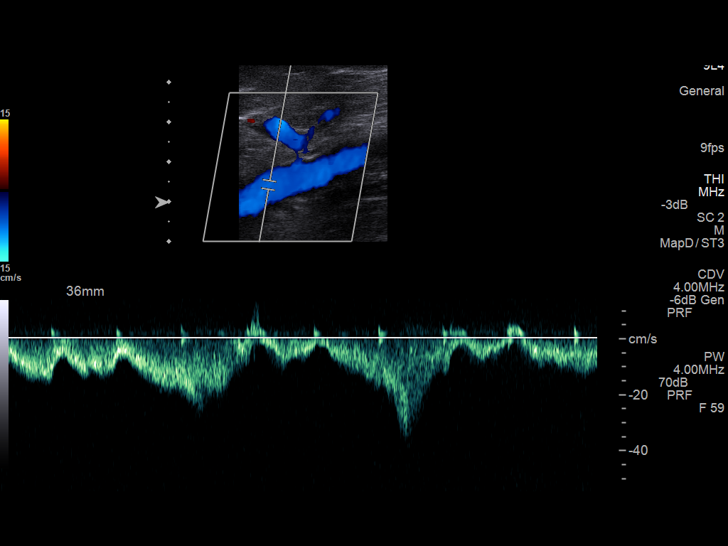
[im 11/42]
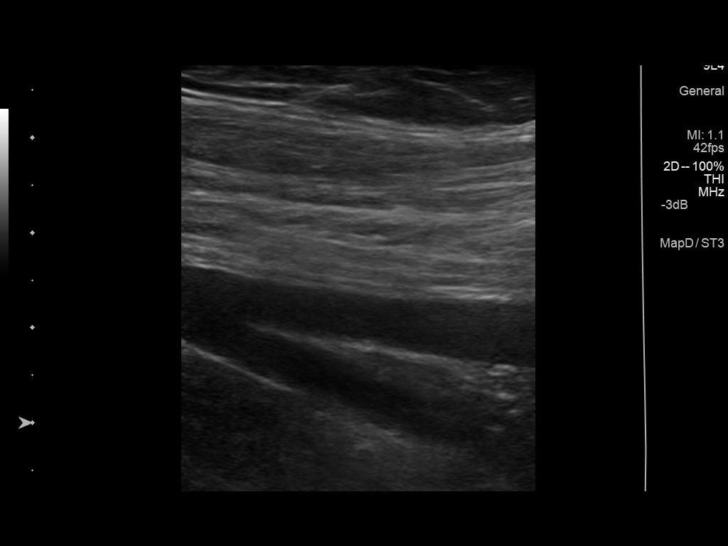
[im 15/42]
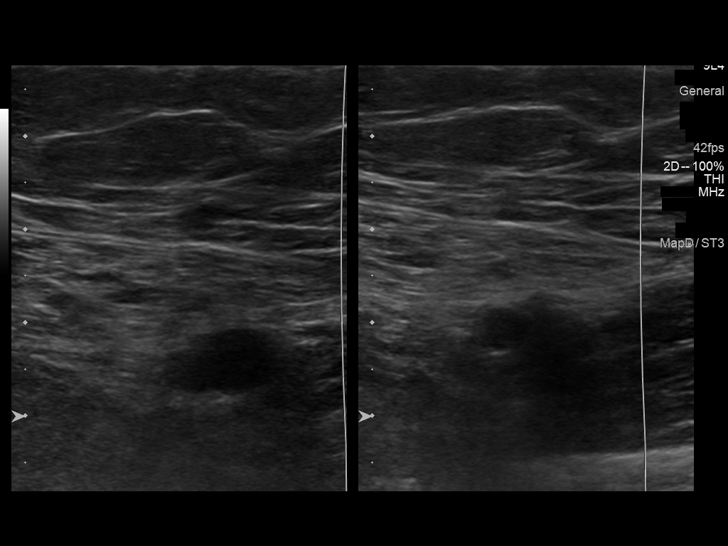
[im 18/42]
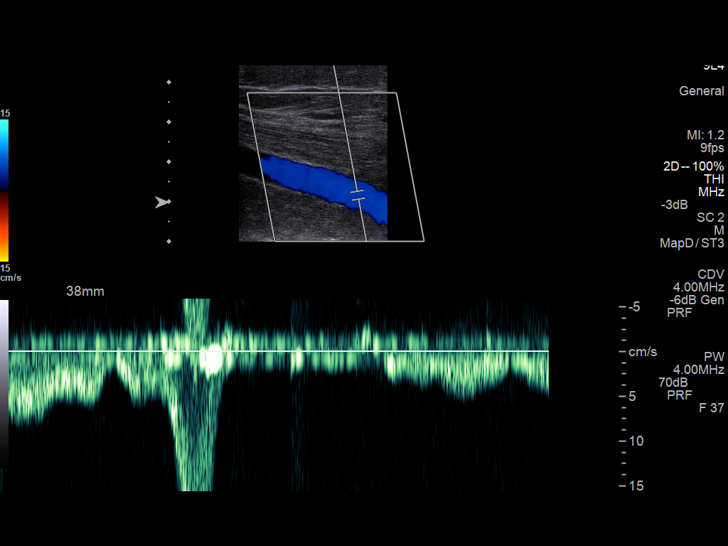
[im 22/42]
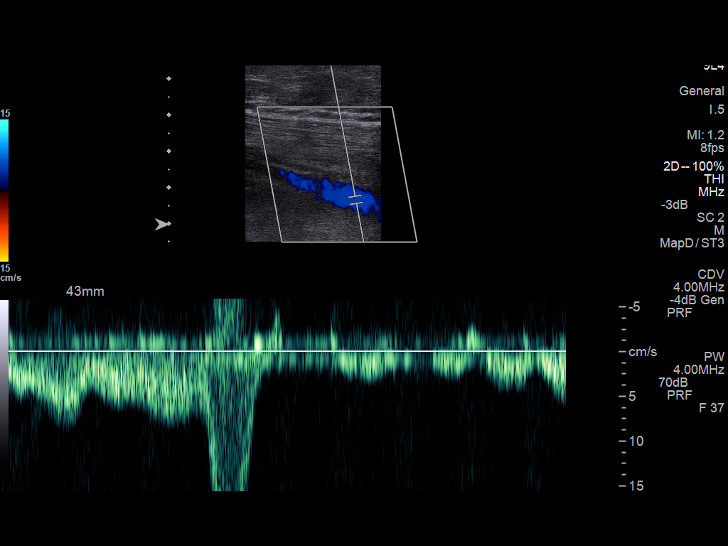
[im 24/42]
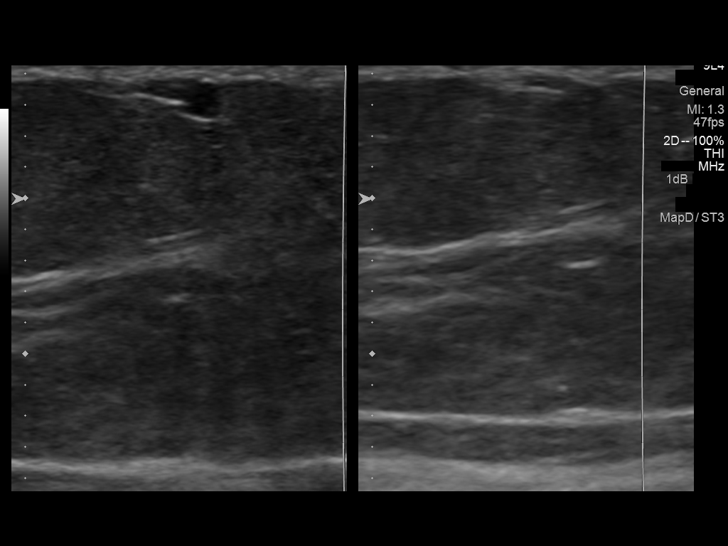
[im 27/42]
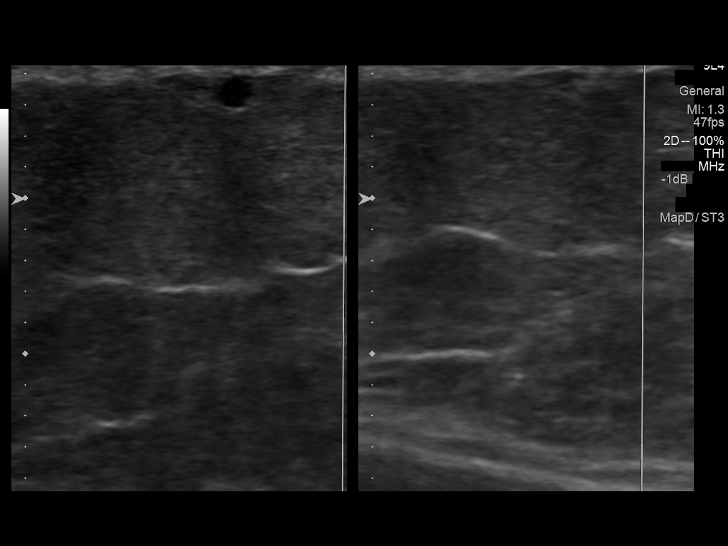
[im 31/42]
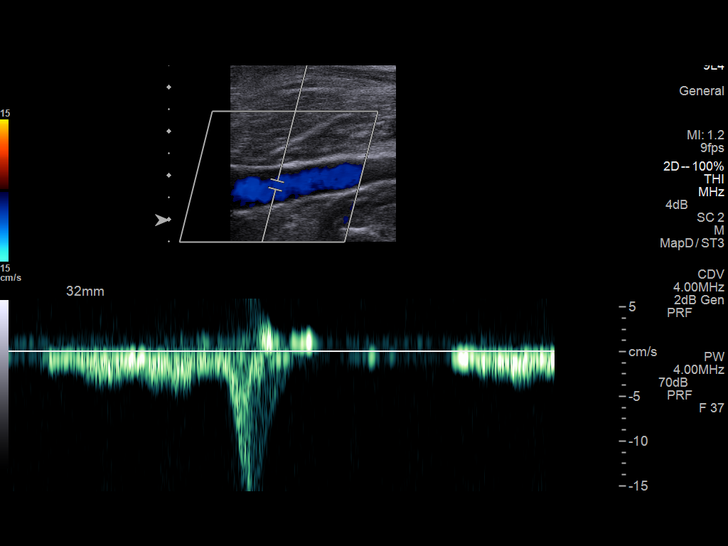
[im 34/42]
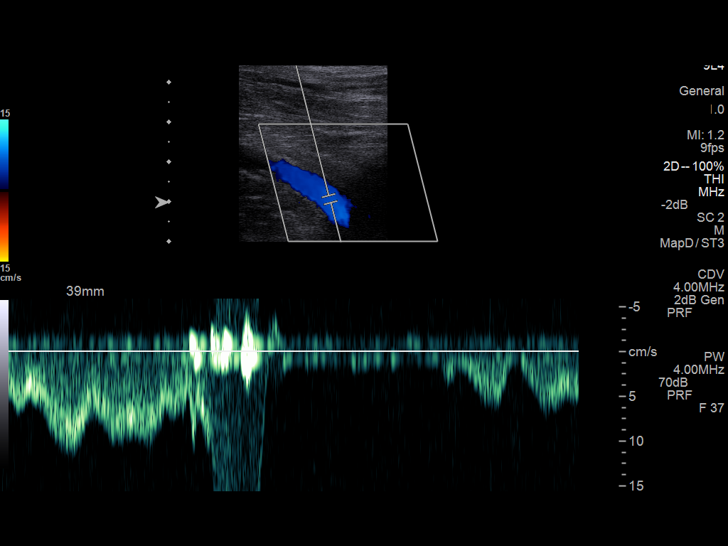
[im 38/42]
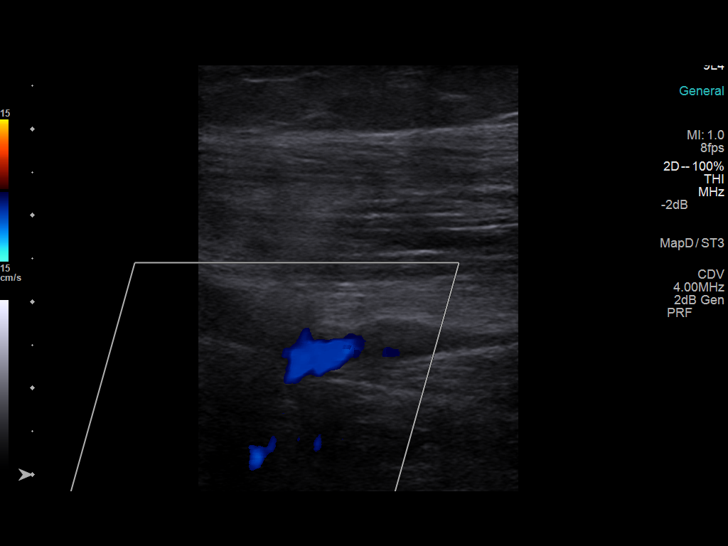
[im 42/42]
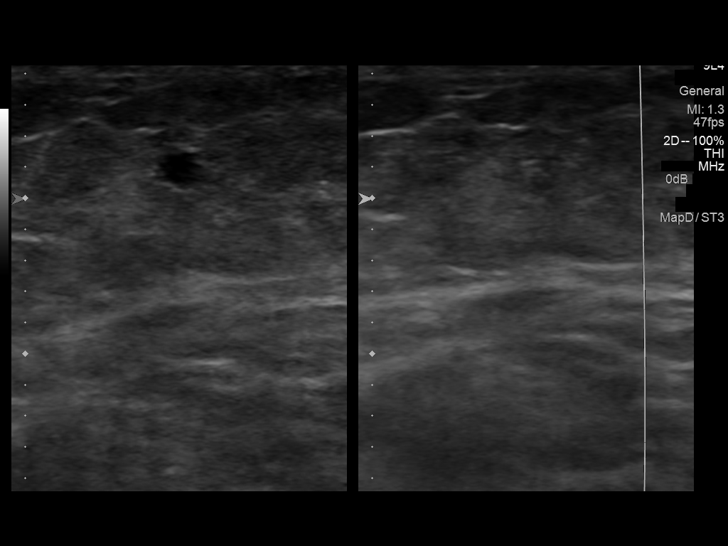

[13 of 24 positions shown; findings below may reference images not displayed]

FINDINGS: Contralateral Common Femoral Vein: Respiratory phasicity is normal
and symmetric with the symptomatic side. No evidence of thrombus.
Normal compressibility. Increased pulsatility of the venous
waveform.

Common Femoral Vein: No evidence of thrombus. Normal
compressibility, respiratory phasicity and response to augmentation.
Increased pulsatility of the venous waveform.

Saphenofemoral Junction: No evidence of thrombus. Normal
compressibility and flow on color Doppler imaging.

Profunda Femoral Vein: No evidence of thrombus. Normal
compressibility and flow on color Doppler imaging.

Femoral Vein: No evidence of thrombus. Normal compressibility,
respiratory phasicity and response to augmentation.

Popliteal Vein: No evidence of thrombus. Normal compressibility,
respiratory phasicity and response to augmentation.

Calf Veins: No evidence of thrombus. Normal compressibility and flow
on color Doppler imaging.

Superficial Great Saphenous Vein: No evidence of thrombus. Normal
compressibility and flow on color Doppler imaging.

Venous Reflux:  None.

Other Findings: A few small superficial venous varicosities are
noted in the region of clinical concern in the thigh. The great
saphenous vein itself is not particularly enlarged.
IMPRESSION: 1. No evidence of deep venous thrombosis.
2. Mild pulsatility of the venous waveforms. Similar findings can be
seen in the setting of elevated right heart pressures. Typical
differential considerations include tricuspid regurgitation, right
heart dysfunction, COPD and pulmonary arterial hypertension.
3. Small superficial venous varicosities incidentally noted in the
region of clinical concern. No evidence of thrombophlebitis.

## 2017-04-18 ENCOUNTER — Ambulatory Visit (INDEPENDENT_AMBULATORY_CARE_PROVIDER_SITE_OTHER): Payer: BLUE CROSS/BLUE SHIELD | Admitting: Cardiology

## 2017-04-18 ENCOUNTER — Encounter: Payer: Self-pay | Admitting: Cardiology

## 2017-04-18 VITALS — BP 118/70 | HR 66 | Ht 64.0 in | Wt 254.0 lb

## 2017-04-18 DIAGNOSIS — I25119 Atherosclerotic heart disease of native coronary artery with unspecified angina pectoris: Secondary | ICD-10-CM | POA: Diagnosis not present

## 2017-04-18 DIAGNOSIS — I350 Nonrheumatic aortic (valve) stenosis: Secondary | ICD-10-CM | POA: Diagnosis not present

## 2017-04-18 DIAGNOSIS — E782 Mixed hyperlipidemia: Secondary | ICD-10-CM | POA: Diagnosis not present

## 2017-04-18 DIAGNOSIS — I10 Essential (primary) hypertension: Secondary | ICD-10-CM | POA: Diagnosis not present

## 2017-04-18 NOTE — Patient Instructions (Addendum)
Your physician wants you to follow-up in:6 months with Dr.McDowell You will receive a reminder letter in the mail two months in advance. If you don't receive a letter, please call our office to schedule the follow-up appointment.   Your physician recommends that you continue on your current medications as directed. Please refer to the Current Medication list given to you today.   If you need a refill on your cardiac medications before your next appointment, please call your pharmacy.    No lab work or tests ordered today.      Thank you for choosing [facility] !         

## 2017-04-19 IMAGING — MG MM DIAGNOSTIC UNILATERAL L
2 series · 2 of 2 positions shown · non-contrast
Comparison: Previous exam(s).

CLINICAL DATA: 58-year-old female status post stereotactic guided
biopsy of left breast calcifications

EXAM:
DIAGNOSTIC LEFT MAMMOGRAM POST STEREOTACTIC BIOPSY

[L CC]
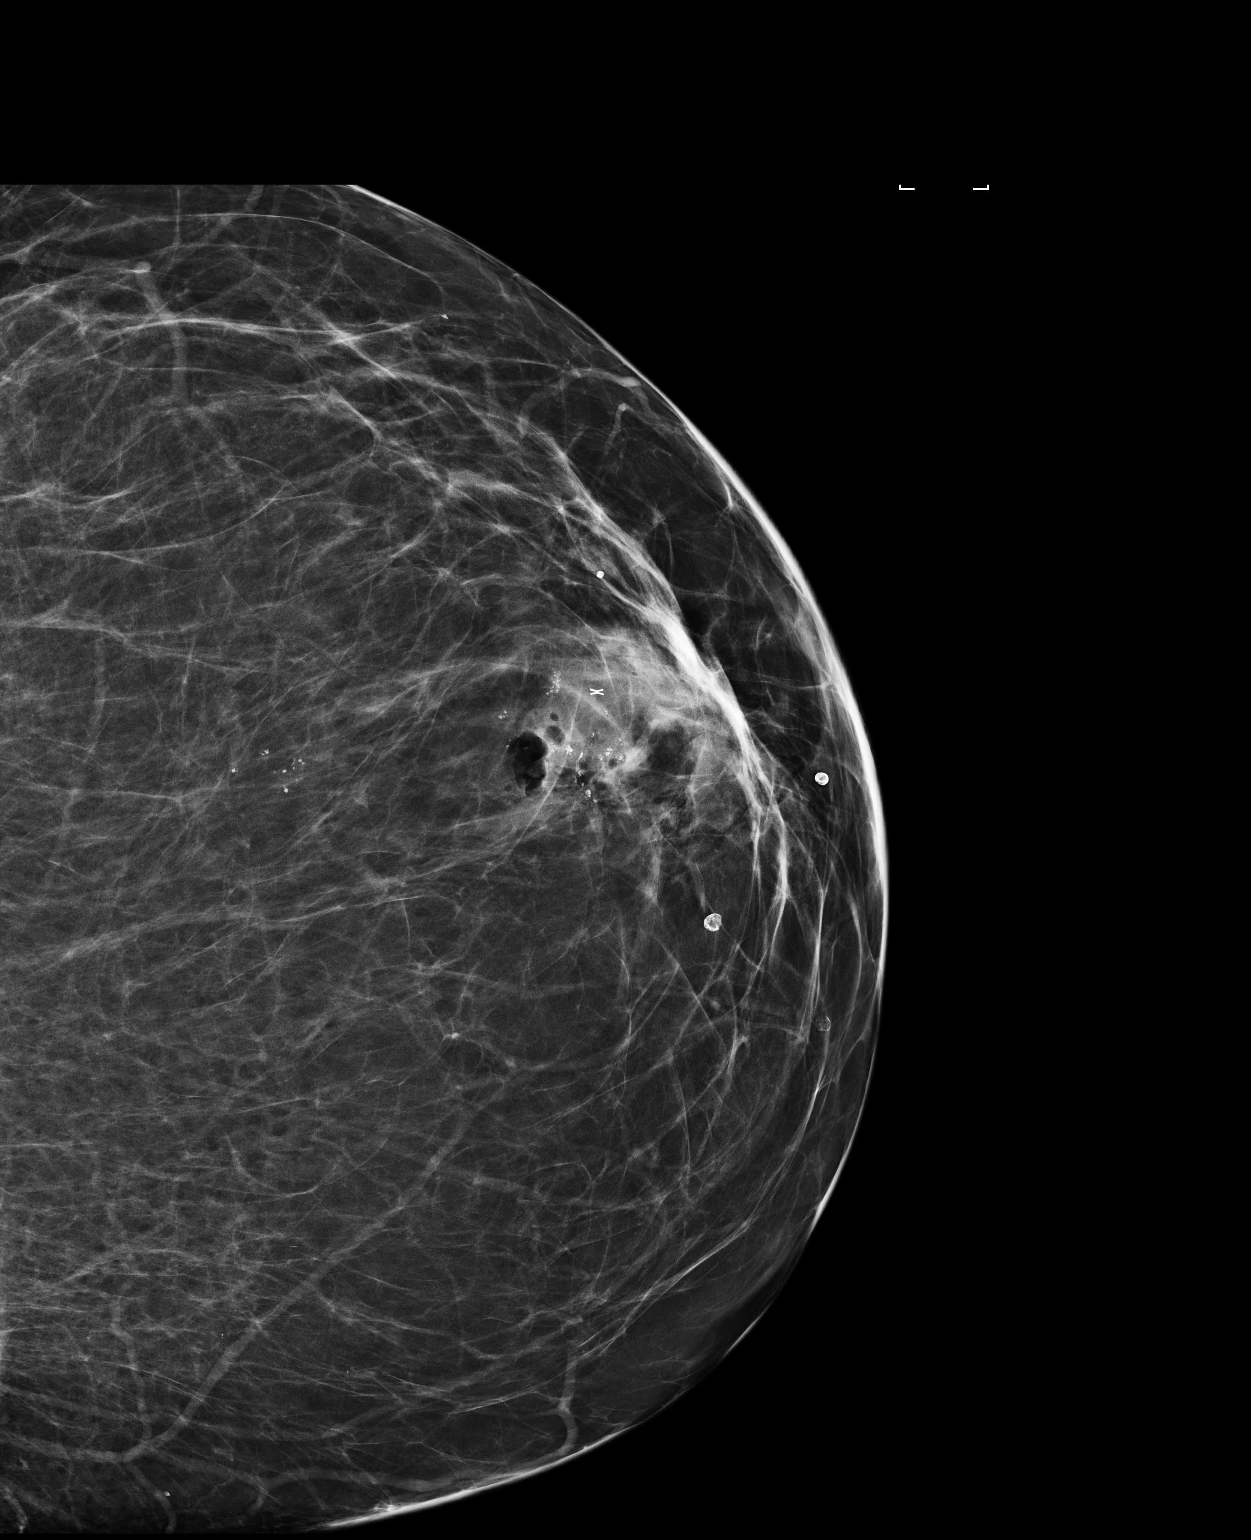

[L ML]
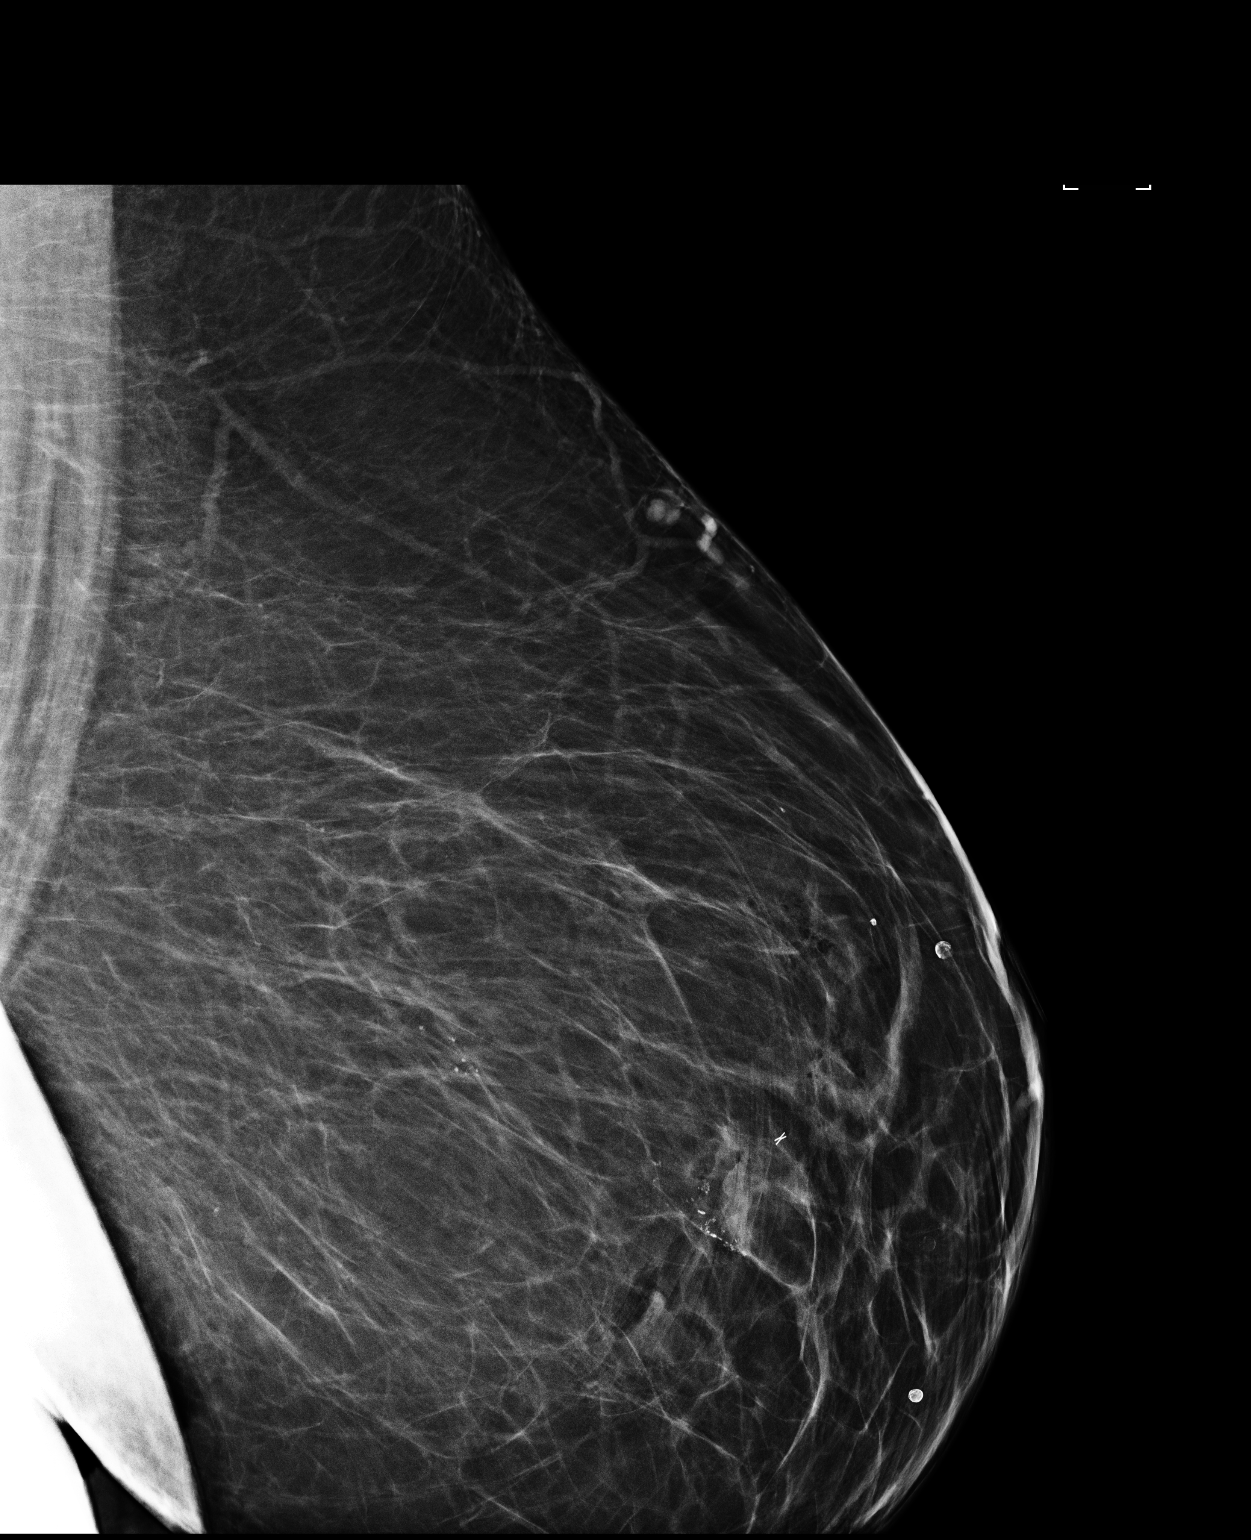

[2 of 2 positions shown; findings below may reference images not displayed]

FINDINGS: Mammographic images were obtained following stereotactic guided
biopsy of left breast calcifications. Post biopsy mammogram
demonstrates the X shaped biopsy marker to be slightly superior to
the expected location on the lateral view. Numerous residual
calcifications are seen.
IMPRESSION: Left breast marker placement as above.

Final Assessment: Post Procedure Mammograms for Marker Placement

## 2017-04-19 IMAGING — MG MM LT BREAST BX W LOC DEV 1ST LESION
1 series · 1 of 1 positions shown · non-contrast
Comparison: Previous exams.

ADDENDUM:
Pathology reveals Left benign breast tissue. Biopsies demonstrate
predominantly benign fibrofatty mammary soft tissue. There are a few
densely sclerotic ducts with associated coarse calcifications
present. The remaining breast tissue demonstrates patchy areas of
stromal hyalinization in which there are benign ducts and lobules.
This was found to be concordant by Dr. Mtilak Dinesh Pandit. Pathology results
were discussed with the patient via telephone. The patient reported
tenderness at the biopsy site and is doing well otherwise. Post
biopsy care and instructions were reviewed and questions were
answered. The patient was encouraged to call The [[email or web address]] with any additional questions and or concerns.
The patient was instructed to have a follow up bilateral diagnostic
mammogram in Friday December, 2014. The patient is requesting this follow
up mammogram be performed at The [[email or web address]]. The patient was
informed a reminder notice would be sent regarding this appointment.

Pathology results reported by Jullon Lienad RN on October 21, 2014.
CLINICAL DATA: 58-year-old female for biopsy of indeterminate left
breast calcifications
EXAM:
LEFT BREAST STEREOTACTIC CORE NEEDLE BIOPSY

[L SPECIMEN]
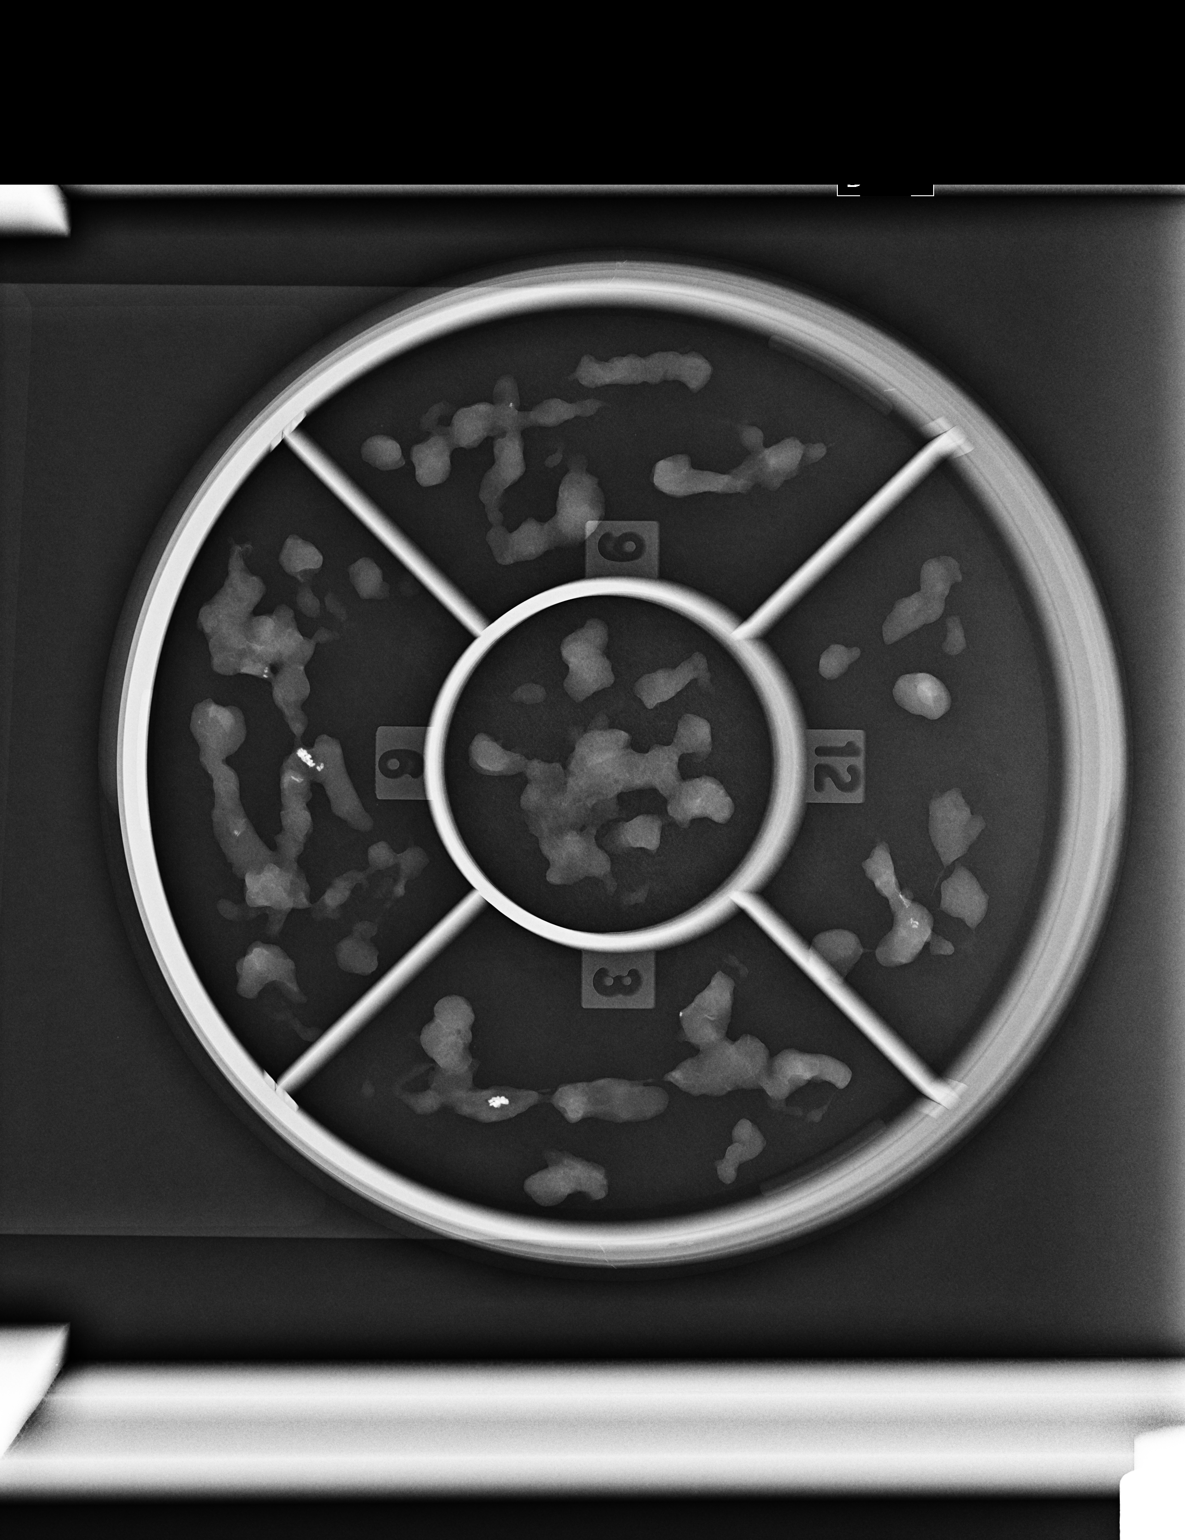

[1 of 1 positions shown; findings below may reference images not displayed]



Using sterile technique and 2% Lidocaine as local anesthetic, under
stereotactic guidance, a 9 gauge vacuum assist device was used to
perform core needle biopsy of calcifications in the upper, outer
quadrant of the left breast using a superior to inferior approach.
Specimen radiograph was performed showing calcifications within the
specimen. Specimens with calcifications are identified for
pathology.

At the conclusion of the procedure, an X shaped tissue marker clip
was deployed into the biopsy cavity. Follow-up 2-view mammogram was
performed and dictated separately.
IMPRESSION: Stereotactic-guided biopsy of indeterminate left breast
calcifications. No apparent complications.

## 2017-04-20 ENCOUNTER — Other Ambulatory Visit: Payer: Self-pay | Admitting: Cardiology

## 2017-05-29 ENCOUNTER — Other Ambulatory Visit: Payer: Self-pay | Admitting: Cardiology

## 2017-08-12 ENCOUNTER — Other Ambulatory Visit: Payer: Self-pay

## 2017-08-12 ENCOUNTER — Emergency Department (HOSPITAL_COMMUNITY)
Admission: EM | Admit: 2017-08-12 | Discharge: 2017-08-12 | Disposition: A | Discharge status: Home / Self Care | Payer: BLUE CROSS/BLUE SHIELD | Attending: Emergency Medicine | Admitting: Emergency Medicine

## 2017-08-12 ENCOUNTER — Encounter (HOSPITAL_COMMUNITY): Payer: Self-pay | Admitting: Emergency Medicine

## 2017-08-12 ENCOUNTER — Emergency Department (HOSPITAL_COMMUNITY): Payer: BLUE CROSS/BLUE SHIELD

## 2017-08-12 DIAGNOSIS — I2510 Atherosclerotic heart disease of native coronary artery without angina pectoris: Secondary | ICD-10-CM | POA: Insufficient documentation

## 2017-08-12 DIAGNOSIS — F419 Anxiety disorder, unspecified: Secondary | ICD-10-CM | POA: Insufficient documentation

## 2017-08-12 DIAGNOSIS — Z9049 Acquired absence of other specified parts of digestive tract: Secondary | ICD-10-CM | POA: Insufficient documentation

## 2017-08-12 DIAGNOSIS — Z7982 Long term (current) use of aspirin: Secondary | ICD-10-CM | POA: Diagnosis not present

## 2017-08-12 DIAGNOSIS — F17210 Nicotine dependence, cigarettes, uncomplicated: Secondary | ICD-10-CM | POA: Diagnosis not present

## 2017-08-12 DIAGNOSIS — R51 Headache: Secondary | ICD-10-CM | POA: Diagnosis not present

## 2017-08-12 DIAGNOSIS — Z79899 Other long term (current) drug therapy: Secondary | ICD-10-CM | POA: Insufficient documentation

## 2017-08-12 DIAGNOSIS — I252 Old myocardial infarction: Secondary | ICD-10-CM | POA: Diagnosis not present

## 2017-08-12 DIAGNOSIS — R0602 Shortness of breath: Secondary | ICD-10-CM | POA: Insufficient documentation

## 2017-08-12 DIAGNOSIS — E119 Type 2 diabetes mellitus without complications: Secondary | ICD-10-CM | POA: Diagnosis not present

## 2017-08-12 DIAGNOSIS — J449 Chronic obstructive pulmonary disease, unspecified: Secondary | ICD-10-CM | POA: Diagnosis not present

## 2017-08-12 DIAGNOSIS — H81399 Other peripheral vertigo, unspecified ear: Secondary | ICD-10-CM | POA: Diagnosis not present

## 2017-08-12 DIAGNOSIS — Z794 Long term (current) use of insulin: Secondary | ICD-10-CM | POA: Insufficient documentation

## 2017-08-12 DIAGNOSIS — R5383 Other fatigue: Secondary | ICD-10-CM | POA: Diagnosis present

## 2017-08-12 LAB — COMPREHENSIVE METABOLIC PANEL
ALBUMIN: 3.7 g/dL (ref 3.5–5.0)
ALT: 23 U/L (ref 0–44)
AST: 16 U/L (ref 15–41)
Alkaline Phosphatase: 86 U/L (ref 38–126)
Anion gap: 11 (ref 5–15)
BILIRUBIN TOTAL: 0.4 mg/dL (ref 0.3–1.2)
BUN: 20 mg/dL (ref 8–23)
CHLORIDE: 99 mmol/L (ref 98–111)
CO2: 26 mmol/L (ref 22–32)
Calcium: 9.4 mg/dL (ref 8.9–10.3)
Creatinine, Ser: 0.9 mg/dL (ref 0.44–1.00)
GFR calc Af Amer: >60 mL/min (ref >60)
GFR calc non Af Amer: >60 mL/min (ref >60)
GLUCOSE: 312 mg/dL — AB (ref 70–99)
POTASSIUM: 4.2 mmol/L (ref 3.5–5.1)
Sodium: 136 mmol/L (ref 135–145)
TOTAL PROTEIN: 7.7 g/dL (ref 6.5–8.1)

## 2017-08-12 LAB — CBC WITH DIFFERENTIAL/PLATELET
BASOS PCT: 0 %
Basophils Absolute: 0 10*3/uL (ref 0.0–0.1)
Eosinophils Absolute: 0.2 10*3/uL (ref 0.0–0.7)
Eosinophils Relative: 2 %
HEMATOCRIT: 48.8 % — AB (ref 36.0–46.0)
Hemoglobin: 16.1 g/dL — ABNORMAL HIGH (ref 12.0–15.0)
Lymphocytes Relative: 32 %
Lymphs Abs: 3.2 10*3/uL (ref 0.7–4.0)
MCH: 31.3 pg (ref 26.0–34.0)
MCHC: 33 g/dL (ref 30.0–36.0)
MCV: 94.8 fL (ref 78.0–100.0)
MONO ABS: 0.6 10*3/uL (ref 0.1–1.0)
Monocytes Relative: 7 %
NEUTROS ABS: 5.8 10*3/uL (ref 1.7–7.7)
NEUTROS PCT: 59 %
Platelets: 248 10*3/uL (ref 150–400)
RBC: 5.15 MIL/uL — AB (ref 3.87–5.11)
RDW: 14.6 % (ref 11.5–15.5)
WBC: 9.9 10*3/uL (ref 4.0–10.5)

## 2017-08-12 LAB — URINALYSIS, ROUTINE W REFLEX MICROSCOPIC
BACTERIA UA: NONE SEEN
BILIRUBIN URINE: NEGATIVE
Glucose, UA: >=500 mg/dL — AB
Hgb urine dipstick: NEGATIVE
KETONES UR: NEGATIVE mg/dL
LEUKOCYTES UA: NEGATIVE
NITRITE: NEGATIVE
PROTEIN: NEGATIVE mg/dL
Specific Gravity, Urine: 1.011 (ref 1.005–1.030)
pH: 5 (ref 5.0–8.0)

## 2017-08-12 LAB — TROPONIN I: Troponin I: <0.03 ng/mL (ref <0.03)

## 2017-08-12 LAB — BRAIN NATRIURETIC PEPTIDE: B Natriuretic Peptide: 64 pg/mL (ref 0.0–100.0)

## 2017-08-12 LAB — CBG MONITORING, ED: Glucose-Capillary: 296 mg/dL — ABNORMAL HIGH (ref 70–99)

## 2017-08-12 MED ORDER — MECLIZINE HCL 12.5 MG PO TABS
25.0000 mg | ORAL_TABLET | Freq: Once | ORAL | Status: AC
  Administered 2017-08-12: 25 mg via ORAL
  Filled 2017-08-12: qty 2

## 2017-08-12 MED ORDER — MECLIZINE HCL 25 MG PO TABS: 25.0000 mg | ORAL_TABLET | Freq: Three times a day (TID) | ORAL | 0 refills | Status: DC | PRN

## 2017-08-12 MED ORDER — SODIUM CHLORIDE 0.9 % IV BOLUS
500.0000 mL | Freq: Once | INTRAVENOUS | Status: AC
  Administered 2017-08-12: 500 mL via INTRAVENOUS

## 2017-08-12 NOTE — ED Provider Notes (Signed)
[facility] EMERGENCY DEPARTMENT Provider Note   CSN: [id number] Arrival date & time: 08/12/17  1146     History   Chief Complaint Chief Complaint  Patient presents with  . Dizziness    HPI [name] is a 62 y.o. female.  HPI Patient states she developed severe fatigue and drowsiness this morning while at work.  She was falling asleep during meetings.  She then developed acute onset vertiginous symptoms around 10:00.  Described room spinning sensation with nausea.  Complained of generalized headache.  Denies any hearing changes.  Says the dizziness is worse with head movement.  No focal unilateral weakness or numbness.  Denies chest pain or abdominal pain. Past Medical History:  Diagnosis Date  . Anxiety   . Aortic stenosis    a. Mild by 2D ECHO 11/17/14  . CAD (coronary artery disease)    a. 10/2014: Ant STEMI s/p DES to dLAD  . Carotid disease, bilateral ([place])    a. Duplex 12/2014: mild-mod atherosclerotic plaque without hemodynamically significant stenosis.  [name] COPD (chronic obstructive pulmonary disease) ([place])   . Essential hypertension   . HLD (hyperlipidemia)   . Morbid obesity ([place])   . Myocardial infarction ([place]) 11/17/2014  . PONV (postoperative nausea and vomiting)   . Shingles   . Sleep apnea    uses CPAP  . Type 2 diabetes mellitus [facility])     Patient Active Problem List   Diagnosis Date Noted  . Morbid obesity ([place]) 01/14/2015  . CAD (coronary artery disease)   . COPD (chronic obstructive pulmonary disease) ([place])   . Hypertension   . DM type 2 causing vascular disease ([place])   . Current smoker   . HLD (hyperlipidemia)   . Aortic stenosis   . ST elevation (STEMI) myocardial infarction involving left anterior descending coronary artery ([place]) 11/17/2014    Past Surgical History:  Procedure Laterality Date  . ABDOMINAL HYSTERECTOMY    . CARDIAC CATHETERIZATION N/A 11/17/2014   Procedure: Left Heart Cath and Coronary Angiography;  Surgeon: Wellington Hampshire, MD;  Location: [place] CV LAB;  Service: Cardiovascular;  Laterality: N/A;  . CARDIAC CATHETERIZATION N/A 11/17/2014   Procedure: Coronary Stent Intervention;  Surgeon: Wellington Hampshire, MD;  Location: [place] CV LAB;  Service: Cardiovascular;  Laterality: N/A;  distal lad 2.25x20 promus  . CATARACT EXTRACTION W/PHACO Left 09/20/2016   Procedure: CATARACT EXTRACTION PHACO AND INTRAOCULAR LENS PLACEMENT (IOC);  Surgeon: Tonny Branch, MD;  Location: [facility];  Service: Ophthalmology;  Laterality: Left;  CDE: 9.27  . CATARACT EXTRACTION W/PHACO Right 10/18/2016   Procedure: CATARACT EXTRACTION PHACO AND INTRAOCULAR LENS PLACEMENT (IOC);  Surgeon: Tonny Branch, MD;  Location: [facility];  Service: Ophthalmology;  Laterality: Right;  CDE: 6.38  . CHOLECYSTECTOMY    . SPINAL FUSION     cervical; screws and plates.     OB History    Gravida  1   Para  1   Term  1   Preterm      AB      Living  1     SAB      TAB      Ectopic      Multiple      Live Births               Home Medications    Prior to Admission medications   Medication Sig Start Date End Date Taking? Authorizing Provider  acetaminophen (TYLENOL) 500 MG tablet Take  500 mg by mouth daily as needed for moderate pain or headache.   Yes [provider]  albuterol (PROVENTIL HFA;VENTOLIN HFA) 108 (90 Base) MCG/ACT inhaler Inhale 2 puffs into the lungs every 4 (four) hours as needed for wheezing or shortness of breath. 05/17/15  Yes Francine Graven, DO  albuterol (PROVENTIL) (2.5 MG/3ML) 0.083% nebulizer solution Take 3 mLs (2.5 mg total) by nebulization every 4 (four) hours as needed for wheezing or shortness of breath. 05/17/15  Yes Francine Graven, DO  aspirin EC 81 MG tablet Take 81 mg by mouth daily.   Yes [provider]  atorvastatin (LIPITOR) 10 MG tablet Take 1 tablet (10 mg total) by mouth every other day. Take at dinner 10/19/16 08/12/17 Yes Satira Sark, MD  escitalopram  (LEXAPRO) 20 MG tablet Take 20 mg by mouth 2 (two) times daily.  09/02/14  Yes [provider]  furosemide (LASIX) 20 MG tablet TAKE 1 TABLET BY MOUTH EVERY DAY 05/30/17  Yes Satira Sark, MD  insulin aspart (NOVOLOG) 100 UNIT/ML injection Inject 35 Units into the skin 3 (three) times daily between meals.   Yes [provider]  insulin detemir (LEVEMIR FLEXPEN) 100 UNIT/ML injection Inject 80 Units into the skin 2 (two) times daily.    Yes [provider]  lisinopril (PRINIVIL,ZESTRIL) 10 MG tablet TAKE 1 TABLET BY MOUTH EVERY DAY 05/30/17  Yes Satira Sark, MD  metFORMIN (GLUCOPHAGE) 1000 MG tablet Take 1,000 mg by mouth 2 (two) times daily.  09/26/14  Yes [provider]  metoprolol tartrate (LOPRESSOR) 25 MG tablet TAKE 1/2 TABLET BY MOUTH TWICE A DAY 04/20/17  Yes Satira Sark, MD  traMADol (ULTRAM) 50 MG tablet Take 1 tablet (50 mg total) by mouth every 6 (six) hours as needed. 04/06/17  Yes Triplett, Tammy, PA-C  UNIFINE PENTIPS 31G X 8 MM MISC USE AS DIRECTED 10 TIMES A DAY 11/04/14  Yes [provider]  Vitamin D, Ergocalciferol, (DRISDOL) 50000 units CAPS capsule Take 50,000 Units by mouth every Saturday. 08/30/16  Yes [provider]  meclizine (ANTIVERT) 25 MG tablet Take 1 tablet (25 mg total) by mouth 3 (three) times daily as needed for dizziness. 08/12/17   Julianne Rice, MD    Family History Family History  Problem Relation Age of Onset  . Heart attack Father   . Heart disease Father   . Cancer Brother     Social History Social History   Tobacco Use  . Smoking status: Current Some Day Smoker    Packs/day: 0.25    Years: 30.00    Pack years: 7.50    Types: Cigarettes  . Smokeless tobacco: Never Used  . Tobacco comment: 3 cigs per day  Substance Use Topics  . Alcohol use: No    Alcohol/week: 0.0 oz  . Drug use: No     Allergies   Codeine and Sulfur   Review of Systems Review of Systems    Constitutional: Positive for fatigue. Negative for chills and fever.  HENT: Negative for congestion, sinus pressure, sinus pain and trouble swallowing.   Eyes: Negative for visual disturbance.  Respiratory: Positive for cough, shortness of breath and wheezing.   Cardiovascular: Negative for chest pain, palpitations and leg swelling.  Gastrointestinal: Positive for nausea. Negative for abdominal pain, constipation, diarrhea and vomiting.  Genitourinary: Negative for dysuria, flank pain, frequency and hematuria.  Musculoskeletal: Negative for back pain, myalgias, neck pain and neck stiffness.  Skin: Negative for rash and  wound.  Neurological: Positive for dizziness and headaches. Negative for weakness, light-headedness and numbness.  All other systems reviewed and are negative.    Physical Exam Updated Vital Signs BP (!) 115/51   Pulse 60   Temp 98.4 F (36.9 C)   Resp (!) 24   Ht 5\' 4"  (1.626 m)   Wt 111.1 kg (245 lb)   SpO2 93%   BMI 42.05 kg/m   Physical Exam  Constitutional: She is oriented to person, place, and time. She appears well-developed and well-nourished.  HENT:  Head: Normocephalic and atraumatic.  Mouth/Throat: Oropharynx is clear and moist. No oropharyngeal exudate.  Eyes: Pupils are equal, round, and reactive to light. EOM are normal.  Fatigable horizontal nystagmus  Neck: Normal range of motion. Neck supple. No JVD present.  Cardiovascular: Normal rate and regular rhythm.  Murmur heard. Pulmonary/Chest: Effort normal and breath sounds normal.  Diminished breath sounds in bilateral bases.  End expiratory wheezes  Abdominal: Soft. Bowel sounds are normal. There is no tenderness. There is no rebound and no guarding.  Musculoskeletal: Normal range of motion. She exhibits no edema or tenderness.  No lower extremity swelling, asymmetry or tenderness.  Lymphadenopathy:    She has no cervical adenopathy.  Neurological: She is alert and oriented to person, place,  and time.  Patient is alert and oriented x3 with clear, goal oriented speech. Patient has 5/5 motor in all extremities. Sensation is intact to light touch. Bilateral finger-to-nose is normal with no signs of dysmetria.   Skin: Skin is warm and dry. No rash noted. No erythema.  Psychiatric: She has a normal mood and affect. Her behavior is normal.  Mildly drowsy appearing  Nursing note and vitals reviewed.    ED Treatments / Results  Labs (all labs ordered are listed, but only abnormal results are displayed) Labs Reviewed  CBC WITH DIFFERENTIAL/PLATELET - Abnormal; Notable for the following components:      Result Value   RBC 5.15 (*)    Hemoglobin 16.1 (*)    HCT 48.8 (*)    All other components within normal limits  COMPREHENSIVE METABOLIC PANEL - Abnormal; Notable for the following components:   Glucose, Bld 312 (*)    All other components within normal limits  URINALYSIS, ROUTINE W REFLEX MICROSCOPIC - Abnormal; Notable for the following components:   Color, Urine COLORLESS (*)    Glucose, UA >=500 (*)    All other components within normal limits  CBG MONITORING, ED - Abnormal; Notable for the following components:   Glucose-Capillary 296 (*)    All other components within normal limits  TROPONIN I  BRAIN NATRIURETIC PEPTIDE    EKG None  Radiology Mr Brain Wo Contrast  Result Date: 08/12/2017 CLINICAL DATA:  Ataxia. Vertigo, fatigue, and drowsiness. Nausea. Generalized headache. EXAM: MRI HEAD WITHOUT CONTRAST TECHNIQUE: Multiplanar, multiecho pulse sequences of the brain and surrounding structures were obtained without intravenous contrast. COMPARISON:  Head CT 09/20/2010 FINDINGS: The study is moderately motion degraded. Brain: There is no evidence of acute infarct, intracranial hemorrhage, mass, midline shift, or extra-axial fluid collection. The ventricles and sulci are normal. No significant white matter disease is seen. Vascular: Major intracranial vascular flow voids  are preserved. Skull and upper cervical spine: Unremarkable bone marrow signal. Sinuses/Orbits: Bilateral cataract extraction. Trace right sphenoid sinus mucosal thickening. No significant mastoid fluid. Other: None. IMPRESSION: Unremarkable appearance of the brain within limitations of motion artifact. No evidence of acute abnormality. Electronically Signed   By: Zenia Resides  Jeralyn Ruths M.D.   On: 08/12/2017 16:16    Procedures Procedures (including critical care time)  Medications Ordered in ED Medications  meclizine (ANTIVERT) tablet 25 mg (25 mg Oral Given 08/12/17 1515)  sodium chloride 0.9 % bolus 500 mL (0 mLs Intravenous Stopped 08/12/17 1616)     Initial Impression / Assessment and Plan / ED Course  I have reviewed the triage vital signs and the nursing notes.  Pertinent labs & imaging results that were available during my care of the patient were reviewed by me and considered in my medical decision making (see chart for details).     MRI brain without acute findings.  Given meclizine in the emergency department and states her symptoms have improved.  Able to ambulate.  Blood sugar is elevated.  She is encouraged to follow-up with her primary physician and ENT as needed.  Return precautions have been given.  Final Clinical Impressions(s) / ED Diagnoses   Final diagnoses:  Peripheral vertigo, unspecified laterality    ED Discharge Orders        Ordered    meclizine (ANTIVERT) 25 MG tablet  3 times daily PRN     08/12/17 1812       Julianne Rice, MD 08/12/17 1815

## 2017-08-12 NOTE — ED Triage Notes (Signed)
Pt states that she is dizzy this morning she is having a headache also. She is nauseated.

## 2017-08-12 NOTE — ED Notes (Signed)
Pt returned from MRI at this time

## 2017-08-12 NOTE — ED Notes (Signed)
Pt ambulated to restroom and back to bed, steady gait, stand by assist, states "some dizziness, but has improved"

## 2017-09-27 DIAGNOSIS — R42 Dizziness and giddiness: Secondary | ICD-10-CM | POA: Insufficient documentation

## 2017-10-05 ENCOUNTER — Other Ambulatory Visit: Payer: Self-pay | Admitting: Cardiology

## 2017-10-17 ENCOUNTER — Encounter: Payer: Self-pay | Admitting: Cardiology

## 2017-10-17 ENCOUNTER — Ambulatory Visit (INDEPENDENT_AMBULATORY_CARE_PROVIDER_SITE_OTHER): Payer: BLUE CROSS/BLUE SHIELD | Admitting: Cardiology

## 2017-10-17 VITALS — BP 106/58 | HR 85 | Ht 64.0 in | Wt 247.0 lb

## 2017-10-17 DIAGNOSIS — I350 Nonrheumatic aortic (valve) stenosis: Secondary | ICD-10-CM | POA: Diagnosis not present

## 2017-10-17 DIAGNOSIS — E782 Mixed hyperlipidemia: Secondary | ICD-10-CM

## 2017-10-17 DIAGNOSIS — I10 Essential (primary) hypertension: Secondary | ICD-10-CM | POA: Diagnosis not present

## 2017-10-17 DIAGNOSIS — I2510 Atherosclerotic heart disease of native coronary artery without angina pectoris: Secondary | ICD-10-CM | POA: Diagnosis not present

## 2017-10-17 NOTE — Patient Instructions (Addendum)
Your physician wants you to follow-up in: 6 months with Dr.McDowell You will receive a reminder letter in the mail two months in advance. If you don't receive a letter, please call our office to schedule the follow-up appointment.   Your physician has requested that you have a lexiscan myoview. For further information please visit [email or web address]. Please follow instruction sheet, as given.     Your physician recommends that you continue on your current medications as directed. Please refer to the Current Medication list given to you today.   If you need a refill on your cardiac medications before your next appointment, please call your pharmacy.         Thank you for choosing [facility] !

## 2017-10-17 NOTE — Progress Notes (Signed)
Cardiology Office Note  Date: 10/17/2017   ID: [name], [name] 1955-05-02, MRN [id number]  PCP: Monico Blitz, MD  Primary Cardiologist: Rozann Lesches, MD   Chief Complaint  Patient presents with  . Coronary Artery Disease    History of Present Illness: [name] is a 62 y.o. female last seen in March.  She is here for a routine follow-up visit.  She does not report any active angina symptoms or nitroglycerin use.  States that she has been walking on a treadmill for 15 to 30 minutes 3 days a week.  She plans to increase activity and lose weight.  She is status post anterior STEMI with DES to the distal LAD in October 2016.  We discussed follow-up with a stress test around this time on medical therapy.  She is in agreement.  Current cardiac regimen includes aspirin, Lipitor, Lasix, lisinopril, Lopressor, and as needed nitroglycerin.  She is due for follow-up lipids with Dr. Manuella Ghazi later next month.  Past Medical History:  Diagnosis Date  . Anxiety   . Aortic stenosis    a. Mild by 2D ECHO 11/17/14  . CAD (coronary artery disease)    a. 10/2014: Ant STEMI s/p DES to dLAD  . Carotid disease, bilateral ([place])    a. Duplex 12/2014: mild-mod atherosclerotic plaque without hemodynamically significant stenosis.  [name] COPD (chronic obstructive pulmonary disease) ([place])   . Essential hypertension   . HLD (hyperlipidemia)   . Morbid obesity ([place])   . Myocardial infarction ([place]) 11/17/2014  . PONV (postoperative nausea and vomiting)   . Shingles   . Sleep apnea    uses CPAP  . Type 2 diabetes mellitus ([place])     Past Surgical History:  Procedure Laterality Date  . ABDOMINAL HYSTERECTOMY    . CARDIAC CATHETERIZATION N/A 11/17/2014   Procedure: Left Heart Cath and Coronary Angiography;  Surgeon: Wellington Hampshire, MD;  Location: [place] CV LAB;  Service: Cardiovascular;  Laterality: N/A;  . CARDIAC CATHETERIZATION N/A 11/17/2014   Procedure: Coronary Stent Intervention;   Surgeon: Wellington Hampshire, MD;  Location: [place] CV LAB;  Service: Cardiovascular;  Laterality: N/A;  distal lad 2.25x20 promus  . CATARACT EXTRACTION W/PHACO Left 09/20/2016   Procedure: CATARACT EXTRACTION PHACO AND INTRAOCULAR LENS PLACEMENT (IOC);  Surgeon: Tonny Branch, MD;  Location: [facility];  Service: Ophthalmology;  Laterality: Left;  CDE: 9.27  . CATARACT EXTRACTION W/PHACO Right 10/18/2016   Procedure: CATARACT EXTRACTION PHACO AND INTRAOCULAR LENS PLACEMENT (IOC);  Surgeon: Tonny Branch, MD;  Location: [facility];  Service: Ophthalmology;  Laterality: Right;  CDE: 6.38  . CHOLECYSTECTOMY    . SPINAL FUSION     cervical; screws and plates.    Current Outpatient Medications  Medication Sig Dispense Refill  . acetaminophen (TYLENOL) 500 MG tablet Take 500 mg by mouth daily as needed for moderate pain or headache.    . albuterol (PROVENTIL HFA;VENTOLIN HFA) 108 (90 Base) MCG/ACT inhaler Inhale 2 puffs into the lungs every 4 (four) hours as needed for wheezing or shortness of breath. 1 Inhaler 0  . albuterol (PROVENTIL) (2.5 MG/3ML) 0.083% nebulizer solution Take 3 mLs (2.5 mg total) by nebulization every 4 (four) hours as needed for wheezing or shortness of breath. 75 mL 0  . aspirin EC 81 MG tablet Take 81 mg by mouth daily.    [name] atorvastatin (LIPITOR) 10 MG tablet TAKE 1 TABLET (10 MG TOTAL) BY MOUTH EVERY OTHER DAY. TAKE AT DINNER 45  tablet 3  . escitalopram (LEXAPRO) 20 MG tablet Take 20 mg by mouth 2 (two) times daily.   2  . furosemide (LASIX) 20 MG tablet TAKE 1 TABLET BY MOUTH EVERY DAY 90 tablet 3  . insulin aspart (NOVOLOG) 100 UNIT/ML injection Inject 35 Units into the skin 3 (three) times daily between meals.    . insulin detemir (LEVEMIR FLEXPEN) 100 UNIT/ML injection Inject 80 Units into the skin 2 (two) times daily.     [name] lisinopril (PRINIVIL,ZESTRIL) 10 MG tablet TAKE 1 TABLET BY MOUTH EVERY DAY 90 tablet 3  . meclizine (ANTIVERT) 25 MG tablet Take 1 tablet (25 mg total) by  mouth 3 (three) times daily as needed for dizziness. 30 tablet 0  . metFORMIN (GLUCOPHAGE) 1000 MG tablet Take 1,000 mg by mouth 2 (two) times daily.   6  . metoprolol tartrate (LOPRESSOR) 25 MG tablet TAKE 1/2 TABLET BY MOUTH TWICE A DAY 90 tablet 1  . Semaglutide,0.25 or 0.5MG /DOS, (OZEMPIC, 0.25 OR 0.5 MG/DOSE,) 2 MG/1.5ML SOPN     . traMADol (ULTRAM) 50 MG tablet Take 1 tablet (50 mg total) by mouth every 6 (six) hours as needed. 15 tablet 0  . UNIFINE PENTIPS 31G X 8 MM MISC USE AS DIRECTED 10 TIMES A DAY  4  . Vitamin D, Ergocalciferol, (DRISDOL) 50000 units CAPS capsule Take 50,000 Units by mouth every Saturday.  12   No current facility-administered medications for this visit.    Allergies:  Codeine and Sulfur   Social History: The patient  reports that she has been smoking cigarettes. She has a 7.50 pack-year smoking history. She has never used smokeless tobacco. She reports that she does not drink alcohol or use drugs.   ROS:  Please see the history of present illness. Otherwise, complete review of systems is positive for none.  All other systems are reviewed and negative.   Physical Exam: VS:  BP (!) 106/58 (BP Location: Left Arm)   Pulse 85   Ht 5\' 4"  (1.626 m)   Wt 247 lb (112 kg)   SpO2 97%   BMI 42.40 kg/m , BMI Body mass index is 42.4 kg/m.  Wt Readings from Last 3 Encounters:  10/17/17 247 lb (112 kg)  08/12/17 245 lb (111.1 kg)  04/18/17 254 lb (115.2 kg)    General: Patient appears comfortable at rest. HEENT: Conjunctiva and lids normal, oropharynx clear. Neck: Supple, no elevated JVP or carotid bruits, no thyromegaly. Lungs: Clear to auscultation, nonlabored breathing at rest. Cardiac: Regular rate and rhythm, no S3, 2/6 systolic murmur. Abdomen: Soft, nontender, bowel sounds present. Extremities: No pitting edema, distal pulses 2+. Skin: Warm and dry. Musculoskeletal: No kyphosis. Neuropsychiatric: Alert and oriented x3, affect grossly appropriate.  ECG:  I personally reviewed the tracing from 04/18/2017 which showed sinus bradycardia with nonspecific T wave changes.  Recent Labwork: 08/12/2017: ALT 23; AST 16; B Natriuretic Peptide 64.0; BUN 20; Creatinine, Ser 0.90; Hemoglobin 16.1; Platelets 248; Potassium 4.2; Sodium 136     Component Value Date/Time   CHOL 107 (L) 11/07/2015 1037   TRIG 154 (H) 11/07/2015 1037   HDL 38 (L) 11/07/2015 1037   CHOLHDL 2.8 11/07/2015 1037   VLDL 31 (H) 11/07/2015 1037   LDLCALC 38 11/07/2015 1037    Other Studies Reviewed Today:  Echocardiogram 05/27/2015: Study Conclusions  - Left ventricle: The cavity size was normal. Wall thickness was   normal. Systolic function was normal. The estimated ejection   fraction was in the  range of 60% to 65%. Wall motion was normal;   there were no regional wall motion abnormalities. Left   ventricular diastolic function parameters were normal for the   patient&'s age. - Aortic valve: Trileaflet; mildly calcified leaflets. Moderate   calcification involving the left coronary cusp. There was mild   stenosis. Mean gradient (S): 9 mm Hg. Peak gradient (S): 20 mm   Hg. VTI ratio of LVOT to aortic valve: 0.72. Valve area (VTI):   1.82 cm^2. Valve area (Vmax): 1.68 cm^2. Valve area (Vmean): 1.76   cm^2. - Mitral valve: There was trivial regurgitation. - Right atrium: Central venous pressure (est): 8 mm Hg. - Tricuspid valve: There was trivial regurgitation. - Pulmonary arteries: PA peak pressure: 41 mm Hg (S). - Pericardium, extracardiac: There was no pericardial effusion.  Impressions:  - Normal LV wall thickness with LVEF 60-65%. Grossly normal   diastolic function with intermediate LV filling pressure. Trivial   mitral regurgitation. Mild aortic stenosis as outlined above.   Trivial tricuspid regurgitation with mildly elevated PASP 41   mmHg.  Assessment and Plan:  1.  CAD status post DES to the LAD in October 2016.  She is doing relatively well at this  point on medical therapy, we discussed her walking plan for exercise.  We will follow-up with a Lexiscan Myoview on medical therapy to reassess ischemic burden at this point.  2.  Mixed hyperlipidemia, on Lipitor.  She will be following up with lab work per Dr. Manuella Ghazi later next month.  3.  Mild aortic stenosis.  Murmur is stable on examination.  She is asymptomatic.  4.  Essential hypertension, blood pressure is well controlled today.  Current medicines were reviewed with the patient today.   Orders Placed This Encounter  Procedures  . NM Myocar Multi W/Spect W/Wall Motion / EF    Disposition: Follow-up in 6 months.  Signed, Satira Sark, MD, [facility] 10/17/2017 4:22 PM    [facility] [address] [place], [place] [address] Phone: [phone]; Fax: [phone]

## 2017-10-24 ENCOUNTER — Encounter (HOSPITAL_COMMUNITY): Payer: Self-pay

## 2017-10-24 ENCOUNTER — Ambulatory Visit (HOSPITAL_COMMUNITY)
Admission: RE | Admit: 2017-10-24 | Discharge: 2017-10-24 | Disposition: A | Discharge status: Home / Self Care | Payer: BLUE CROSS/BLUE SHIELD | Source: Ambulatory Visit | Attending: Cardiology | Admitting: Cardiology

## 2017-10-24 ENCOUNTER — Encounter (HOSPITAL_COMMUNITY)
Admission: RE | Admit: 2017-10-24 | Discharge: 2017-10-24 | Disposition: A | Discharge status: Home / Self Care | Payer: BLUE CROSS/BLUE SHIELD | Source: Ambulatory Visit | Attending: Cardiology | Admitting: Cardiology

## 2017-10-24 DIAGNOSIS — I2510 Atherosclerotic heart disease of native coronary artery without angina pectoris: Secondary | ICD-10-CM | POA: Diagnosis present

## 2017-10-24 LAB — NM MYOCAR MULTI W/SPECT W/WALL MOTION / EF
CHL CUP RESTING HR STRESS: 57 {beats}/min
Peak HR: 81 {beats}/min

## 2017-10-24 MED ORDER — REGADENOSON 0.4 MG/5ML IV SOLN
INTRAVENOUS | Status: AC
  Administered 2017-10-24: 0.4 mg via INTRAVENOUS
  Filled 2017-10-24: qty 5

## 2017-10-24 MED ORDER — TECHNETIUM TC 99M TETROFOSMIN IV KIT
10.0000 | PACK | Freq: Once | INTRAVENOUS | Status: AC | PRN
  Administered 2017-10-24: 11 via INTRAVENOUS

## 2017-10-24 MED ORDER — TECHNETIUM TC 99M TETROFOSMIN IV KIT
30.0000 | PACK | Freq: Once | INTRAVENOUS | Status: AC | PRN
  Administered 2017-10-24: 29.9 via INTRAVENOUS

## 2017-10-24 MED ORDER — SODIUM CHLORIDE 0.9% FLUSH
INTRAVENOUS | Status: AC
  Administered 2017-10-24: 10 mL via INTRAVENOUS
  Filled 2017-10-24: qty 10

## 2018-01-15 ENCOUNTER — Other Ambulatory Visit: Payer: Self-pay

## 2018-01-15 ENCOUNTER — Emergency Department (HOSPITAL_COMMUNITY): Payer: BLUE CROSS/BLUE SHIELD

## 2018-01-15 ENCOUNTER — Encounter (HOSPITAL_COMMUNITY): Payer: Self-pay | Admitting: Emergency Medicine

## 2018-01-15 ENCOUNTER — Emergency Department (HOSPITAL_COMMUNITY)
Admission: EM | Admit: 2018-01-15 | Discharge: 2018-01-15 | Disposition: A | Discharge status: Home / Self Care | Payer: BLUE CROSS/BLUE SHIELD | Attending: Emergency Medicine | Admitting: Emergency Medicine

## 2018-01-15 DIAGNOSIS — I252 Old myocardial infarction: Secondary | ICD-10-CM | POA: Diagnosis not present

## 2018-01-15 DIAGNOSIS — Z9049 Acquired absence of other specified parts of digestive tract: Secondary | ICD-10-CM | POA: Diagnosis not present

## 2018-01-15 DIAGNOSIS — I10 Essential (primary) hypertension: Secondary | ICD-10-CM | POA: Diagnosis not present

## 2018-01-15 DIAGNOSIS — Z794 Long term (current) use of insulin: Secondary | ICD-10-CM | POA: Diagnosis not present

## 2018-01-15 DIAGNOSIS — Z79899 Other long term (current) drug therapy: Secondary | ICD-10-CM | POA: Diagnosis not present

## 2018-01-15 DIAGNOSIS — E119 Type 2 diabetes mellitus without complications: Secondary | ICD-10-CM | POA: Diagnosis not present

## 2018-01-15 DIAGNOSIS — J029 Acute pharyngitis, unspecified: Secondary | ICD-10-CM | POA: Diagnosis present

## 2018-01-15 DIAGNOSIS — F17210 Nicotine dependence, cigarettes, uncomplicated: Secondary | ICD-10-CM | POA: Insufficient documentation

## 2018-01-15 DIAGNOSIS — F419 Anxiety disorder, unspecified: Secondary | ICD-10-CM | POA: Diagnosis not present

## 2018-01-15 DIAGNOSIS — J449 Chronic obstructive pulmonary disease, unspecified: Secondary | ICD-10-CM | POA: Diagnosis not present

## 2018-01-15 DIAGNOSIS — R05 Cough: Secondary | ICD-10-CM | POA: Diagnosis not present

## 2018-01-15 DIAGNOSIS — I2510 Atherosclerotic heart disease of native coronary artery without angina pectoris: Secondary | ICD-10-CM | POA: Diagnosis not present

## 2018-01-15 DIAGNOSIS — Z7982 Long term (current) use of aspirin: Secondary | ICD-10-CM | POA: Insufficient documentation

## 2018-01-15 DIAGNOSIS — R059 Cough, unspecified: Secondary | ICD-10-CM

## 2018-01-15 LAB — CBC
HCT: 46.8 % — ABNORMAL HIGH (ref 36.0–46.0)
Hemoglobin: 14.5 g/dL (ref 12.0–15.0)
MCH: 30.3 pg (ref 26.0–34.0)
MCHC: 31 g/dL (ref 30.0–36.0)
MCV: 97.9 fL (ref 80.0–100.0)
PLATELETS: 289 10*3/uL (ref 150–400)
RBC: 4.78 MIL/uL (ref 3.87–5.11)
RDW: 13.8 % (ref 11.5–15.5)
WBC: 9.3 10*3/uL (ref 4.0–10.5)
nRBC: 0 % (ref 0.0–0.2)

## 2018-01-15 LAB — BASIC METABOLIC PANEL WITH GFR
Anion gap: 9 (ref 5–15)
BUN: 23 mg/dL (ref 8–23)
CO2: 27 mmol/L (ref 22–32)
Calcium: 8.9 mg/dL (ref 8.9–10.3)
Chloride: 104 mmol/L (ref 98–111)
Creatinine, Ser: 0.89 mg/dL (ref 0.44–1.00)
GFR calc Af Amer: >60 mL/min
GFR calc non Af Amer: >60 mL/min
Glucose, Bld: 123 mg/dL — ABNORMAL HIGH (ref 70–99)
Potassium: 4 mmol/L (ref 3.5–5.1)
Sodium: 140 mmol/L (ref 135–145)

## 2018-01-15 LAB — GROUP A STREP BY PCR: GROUP A STREP BY PCR: NOT DETECTED

## 2018-01-15 MED ORDER — BENZONATATE 100 MG PO CAPS: 100.0000 mg | ORAL_CAPSULE | Freq: Three times a day (TID) | ORAL | 0 refills | Status: DC

## 2018-01-15 MED ORDER — DOXYCYCLINE HYCLATE 100 MG PO CAPS: 100.0000 mg | ORAL_CAPSULE | Freq: Two times a day (BID) | ORAL | 0 refills | Status: DC

## 2018-01-15 MED ORDER — ACETAMINOPHEN 325 MG PO TABS
650.0000 mg | ORAL_TABLET | Freq: Once | ORAL | Status: AC
Start: 2018-01-15 — End: 2018-01-15
  Administered 2018-01-15: 650 mg via ORAL
  Filled 2018-01-15: qty 2

## 2018-01-15 NOTE — Discharge Instructions (Addendum)
You were seen in the emergency department today for respiratory infection type symptoms.  Your labs were reassuring.  Your strep test was negative.  Your chest x-ray did not show an obvious pneumonia.  We are covering you for possible infectious process with your his history of COPD.  We are placing you on doxycycline, an antibiotic, to help with this.  Also send you with a medicine called Tessalon for cough, take this as needed.  Please finish your other medications that were prescribed urgent care.  We have prescribed you new medication(s) today. Discuss the medications prescribed today with your pharmacist as they can have adverse effects and interactions with your other medicines including over the counter and prescribed medications. Seek medical evaluation if you start to experience new or abnormal symptoms after taking one of these medicines, seek care immediately if you start to experience difficulty breathing, feeling of your throat closing, facial swelling, or rash as these could be indications of a more serious allergic reaction Please use your inhaler as needed. Please follow-up with primary care in the next 3 days for reevaluation.  Return to the ER for new or worsening symptoms or any other concerns.

## 2018-01-15 NOTE — ED Provider Notes (Signed)
[facility] EMERGENCY DEPARTMENT Provider Note   CSN: [id number] Arrival date & time: 01/15/18  1420     History   Chief Complaint Chief Complaint  Patient presents with  . Cough    HPI [name] is a 62 y.o. female with a hx of tobacco abuse, COPD, CAD, anxiety, T2DM, & HTN who presents to the ED with complaints of persistent flu like sxs over the past 5-6 days. Patient states she has felt congested w/ sore throat, & productive cough with green mucous sputum. She has also noted some dyspnea more so w/ coughing & activity & associated wheezing. She was seen at an urgent care 12/17 for her sxs w/ onset- she was presumed to have the flu and was placed on tamiflu & given a steroid pack for likely COPD flare. She states she initially started to feel somewhat improved but again worsened over past 2 days. She has 1 day left of Tamiflu. Her sxs are all the same, however she has developed fevers w/ temp max of 100.4, improved w/ tylenol. Dyspnea did not really change with steroids, is associated w/ wheezing & is relieved w/ albuterol use. No other specific alleviating/aggravating factors. She has had some loose non bloody stool. Denies nausea, vomiting, chest pain, abdominal pain, dysuria, lightheadedness, dizziness, or syncope.   HPI  Past Medical History:  Diagnosis Date  . Anxiety   . Aortic stenosis    a. Mild by 2D ECHO 11/17/14  . CAD (coronary artery disease)    a. 10/2014: Ant STEMI s/p DES to dLAD  . Carotid disease, bilateral ([place])    a. Duplex 12/2014: mild-mod atherosclerotic plaque without hemodynamically significant stenosis.  [name] COPD (chronic obstructive pulmonary disease) ([place])   . Essential hypertension   . HLD (hyperlipidemia)   . Morbid obesity ([place])   . Myocardial infarction ([place]) 11/17/2014  . PONV (postoperative nausea and vomiting)   . Shingles   . Sleep apnea    uses CPAP  . Type 2 diabetes mellitus [facility])     Patient Active Problem List   Diagnosis Date  Noted  . Morbid obesity ([place]) 01/14/2015  . CAD (coronary artery disease)   . COPD (chronic obstructive pulmonary disease) ([place])   . Hypertension   . DM type 2 causing vascular disease ([place])   . Current smoker   . HLD (hyperlipidemia)   . Aortic stenosis   . ST elevation (STEMI) myocardial infarction involving left anterior descending coronary artery ([place]) 11/17/2014    Past Surgical History:  Procedure Laterality Date  . ABDOMINAL HYSTERECTOMY    . CARDIAC CATHETERIZATION N/A 11/17/2014   Procedure: Left Heart Cath and Coronary Angiography;  Surgeon: Wellington Hampshire, MD;  Location: [place] CV LAB;  Service: Cardiovascular;  Laterality: N/A;  . CARDIAC CATHETERIZATION N/A 11/17/2014   Procedure: Coronary Stent Intervention;  Surgeon: Wellington Hampshire, MD;  Location: [place] CV LAB;  Service: Cardiovascular;  Laterality: N/A;  distal lad 2.25x20 promus  . CATARACT EXTRACTION W/PHACO Left 09/20/2016   Procedure: CATARACT EXTRACTION PHACO AND INTRAOCULAR LENS PLACEMENT (IOC);  Surgeon: Tonny Branch, MD;  Location: [facility];  Service: Ophthalmology;  Laterality: Left;  CDE: 9.27  . CATARACT EXTRACTION W/PHACO Right 10/18/2016   Procedure: CATARACT EXTRACTION PHACO AND INTRAOCULAR LENS PLACEMENT (IOC);  Surgeon: Tonny Branch, MD;  Location: [facility];  Service: Ophthalmology;  Laterality: Right;  CDE: 6.38  . CHOLECYSTECTOMY    . SPINAL FUSION     cervical; screws and plates.  OB History    Gravida  1   Para  1   Term  1   Preterm      AB      Living  1     SAB      TAB      Ectopic      Multiple      Live Births               Home Medications    Prior to Admission medications   Medication Sig Start Date End Date Taking? Authorizing Provider  acetaminophen (TYLENOL) 500 MG tablet Take 500 mg by mouth daily as needed for moderate pain or headache.    [provider]  albuterol (PROVENTIL HFA;VENTOLIN HFA) 108 (90 Base) MCG/ACT inhaler Inhale 2  puffs into the lungs every 4 (four) hours as needed for wheezing or shortness of breath. 05/17/15   Francine Graven, DO  albuterol (PROVENTIL) (2.5 MG/3ML) 0.083% nebulizer solution Take 3 mLs (2.5 mg total) by nebulization every 4 (four) hours as needed for wheezing or shortness of breath. 05/17/15   Francine Graven, DO  aspirin EC 81 MG tablet Take 81 mg by mouth daily.    [provider]  atorvastatin (LIPITOR) 10 MG tablet TAKE 1 TABLET (10 MG TOTAL) BY MOUTH EVERY OTHER DAY. TAKE AT [facility] 10/05/17 01/03/18  Satira Sark, MD  escitalopram (LEXAPRO) 20 MG tablet Take 20 mg by mouth 2 (two) times daily.  09/02/14   [provider]  furosemide (LASIX) 20 MG tablet TAKE 1 TABLET BY MOUTH EVERY DAY 05/30/17   Satira Sark, MD  insulin aspart (NOVOLOG) 100 UNIT/ML injection Inject 35 Units into the skin 3 (three) times daily between meals.    [provider]  insulin detemir (LEVEMIR FLEXPEN) 100 UNIT/ML injection Inject 80 Units into the skin 2 (two) times daily.     [provider]  lisinopril (PRINIVIL,ZESTRIL) 10 MG tablet TAKE 1 TABLET BY MOUTH EVERY DAY 05/30/17   Satira Sark, MD  meclizine (ANTIVERT) 25 MG tablet Take 1 tablet (25 mg total) by mouth 3 (three) times daily as needed for dizziness. 08/12/17   Julianne Rice, MD  metFORMIN (GLUCOPHAGE) 1000 MG tablet Take 1,000 mg by mouth 2 (two) times daily.  09/26/14   [provider]  metoprolol tartrate (LOPRESSOR) 25 MG tablet TAKE 1/2 TABLET BY MOUTH TWICE A DAY 10/05/17   Satira Sark, MD  Semaglutide,0.25 or 0.5MG /DOS, (OZEMPIC, 0.25 OR 0.5 MG/DOSE,) 2 MG/1.5ML [facility]  08/19/17   [provider]  traMADol (ULTRAM) 50 MG tablet Take 1 tablet (50 mg total) by mouth every 6 (six) hours as needed. 04/06/17   Triplett, Tammy, PA-C  UNIFINE PENTIPS 31G X 8 MM MISC USE AS DIRECTED 10 TIMES A DAY 11/04/14   [provider]  Vitamin D, Ergocalciferol, (DRISDOL) 50000 units  CAPS capsule Take 50,000 Units by mouth every Saturday. 08/30/16   [provider]    Family History Family History  Problem Relation Age of Onset  . Heart attack Father   . Heart disease Father   . Cancer Brother     Social History Social History   Tobacco Use  . Smoking status: Current Some Day Smoker    Packs/day: 0.25    Years: 30.00    Pack years: 7.50    Types: Cigarettes  . Smokeless tobacco: Never Used  . Tobacco comment: 3 cigs per day  Substance Use Topics  .  Alcohol use: No    Alcohol/week: 0.0 standard drinks  . Drug use: No     Allergies   Codeine and Sulfur   Review of Systems Review of Systems  Constitutional: Positive for fever.  HENT: Positive for congestion, ear pain (chronic R ear unchanged) and sore throat.   Respiratory: Positive for cough, shortness of breath and wheezing.   Cardiovascular: Negative for chest pain, palpitations and leg swelling.  Gastrointestinal: Positive for diarrhea (loose stool). Negative for abdominal pain, blood in stool, nausea and vomiting.  Genitourinary: Negative for dysuria, frequency and urgency.  Neurological: Negative for dizziness, syncope, weakness and numbness.     Physical Exam Updated Vital Signs BP (!) 134/49 (BP Location: Right Arm)   Pulse 67   Temp 99 F (37.2 C) (Oral)   Resp 14   Ht 5\' 4"  (1.626 m)   Wt 113.4 kg   SpO2 94%   BMI 42.91 kg/m   Physical Exam Vitals signs and nursing note reviewed.  Constitutional:      General: She is not in acute distress.    Appearance: She is well-developed. She is not ill-appearing, toxic-appearing or diaphoretic.  HENT:     Head: Normocephalic and atraumatic.     Right Ear: Ear canal and external ear normal. Tympanic membrane is not perforated, erythematous, retracted or bulging.     Left Ear: Ear canal and external ear normal. Tympanic membrane is not perforated, erythematous, retracted or bulging.     Nose: Mucosal edema present.      Mouth/Throat:     Pharynx: Uvula midline. Posterior oropharyngeal erythema present. No oropharyngeal exudate.     Comments: Posterior oropharynx is symmetric appearing. Patient tolerating own secretions without difficulty. No trismus. No drooling. No hot potato voice. No swelling beneath the tongue, submandibular compartment is soft.  Eyes:     General:        Right eye: No discharge.        Left eye: No discharge.     Conjunctiva/sclera: Conjunctivae normal.     Pupils: Pupils are equal, round, and reactive to light.  Neck:     Musculoskeletal: Normal range of motion and neck supple.  Cardiovascular:     Rate and Rhythm: Normal rate and regular rhythm.     Heart sounds: No murmur.  Pulmonary:     Effort: Pulmonary effort is normal. No accessory muscle usage or respiratory distress.     Breath sounds: Normal breath sounds. No wheezing, rhonchi or rales.  Abdominal:     General: There is no distension.     Palpations: Abdomen is soft.     Tenderness: There is no abdominal tenderness. There is no guarding or rebound.  Musculoskeletal:     Right lower leg: No edema.     Left lower leg: No edema.  Lymphadenopathy:     Cervical: No cervical adenopathy.  Skin:    General: Skin is warm and dry.     Findings: No rash.  Neurological:     Mental Status: She is alert.  Psychiatric:        Behavior: Behavior normal.      ED Treatments / Results  Labs (all labs ordered are listed, but only abnormal results are displayed) Labs Reviewed  BASIC METABOLIC PANEL - Abnormal; Notable for the following components:      Result Value   Glucose, Bld 123 (*)    All other components within normal limits  CBC - Abnormal; Notable for the following  components:   HCT 46.8 (*)    All other components within normal limits    EKG None  Radiology Dg Chest 2 View  Result Date: 01/15/2018 CLINICAL DATA:  Flu like symptoms since Tuesday, history coronary artery disease, COPD, diabetes mellitus,  hypertension EXAM: CHEST - 2 VIEW COMPARISON:  11/01/2015 FINDINGS: Upper normal heart size. Mediastinal contours and pulmonary vascularity normal. Chronic accentuation of interstitial markings, stable. No acute infiltrate, pleural effusion or pneumothorax. Prior cervical spine fusion. No acute bony findings. IMPRESSION: Mild chronic accentuation of interstitial markings without acute infiltrate. Electronically Signed   By: Lavonia Dana M.D.   On: 01/15/2018 16:37    Procedures Procedures (including critical care time)  Medications Ordered in ED Medications - No data to display   Initial Impression / Assessment and Plan / ED Course  I have reviewed the triage vital signs and the nursing notes.  Pertinent labs & imaging results that were available during my care of the patient were reviewed by me and considered in my medical decision making (see chart for details).   Patient presents to the ED with persistent URI sxs & fever, recently presumed to have flu, currently on tamiflu, also on steroid pack for COPD exacerbation. Overall well appearing, vitals without significant abnormality, SpO2 seems consistent with prior visit vitals.  Labs per triage re-assuring, no leukocytosis, no anemia, no significant electrolyte disturbance. Strep negative. No meningismus. Lungs without wheezing, do not feel neb tx is necessary, already on steroid, does not appear in respiratory distress. CXR without infiltrate, however patient w/ hx of COPD, increased sputum production/mucous, will cover with Doxycycline. Tessalon for cough. PCP follow up. I discussed results, treatment plan, need for follow-up, and return precautions with the patient. Provided opportunity for questions, patient confirmed understanding and is in agreement with plan.   Final Clinical Impressions(s) / ED Diagnoses   Final diagnoses:  Cough    ED Discharge Orders         Ordered    doxycycline (VIBRAMYCIN) 100 MG capsule  2 times daily      01/15/18 1752    benzonatate (TESSALON) 100 MG capsule  Every 8 hours     01/15/18 1754           Martel Galvan, Glynda Jaeger, PA-C 01/15/18 Diamantina Monks, MD 01/15/18 2320

## 2018-01-15 NOTE — ED Triage Notes (Signed)
Flu like symptoms since Tuesday. Pt seen by urgent care diagnosed with flu and given tamilflu. Pt states no better.

## 2018-03-16 ENCOUNTER — Other Ambulatory Visit: Payer: Self-pay | Admitting: Cardiology

## 2018-04-04 ENCOUNTER — Encounter (HOSPITAL_COMMUNITY): Payer: Self-pay | Admitting: Emergency Medicine

## 2018-04-04 ENCOUNTER — Other Ambulatory Visit: Payer: Self-pay

## 2018-04-04 ENCOUNTER — Emergency Department (HOSPITAL_COMMUNITY)
Admission: EM | Admit: 2018-04-04 | Discharge: 2018-04-04 | Disposition: A | Discharge status: Home / Self Care | Payer: BLUE CROSS/BLUE SHIELD | Attending: Emergency Medicine | Admitting: Emergency Medicine

## 2018-04-04 ENCOUNTER — Emergency Department (HOSPITAL_COMMUNITY): Payer: BLUE CROSS/BLUE SHIELD

## 2018-04-04 DIAGNOSIS — Z794 Long term (current) use of insulin: Secondary | ICD-10-CM | POA: Insufficient documentation

## 2018-04-04 DIAGNOSIS — R739 Hyperglycemia, unspecified: Secondary | ICD-10-CM

## 2018-04-04 DIAGNOSIS — R5383 Other fatigue: Secondary | ICD-10-CM | POA: Insufficient documentation

## 2018-04-04 DIAGNOSIS — Z79899 Other long term (current) drug therapy: Secondary | ICD-10-CM | POA: Diagnosis not present

## 2018-04-04 DIAGNOSIS — Z20828 Contact with and (suspected) exposure to other viral communicable diseases: Secondary | ICD-10-CM | POA: Insufficient documentation

## 2018-04-04 DIAGNOSIS — R509 Fever, unspecified: Secondary | ICD-10-CM | POA: Insufficient documentation

## 2018-04-04 DIAGNOSIS — F17210 Nicotine dependence, cigarettes, uncomplicated: Secondary | ICD-10-CM | POA: Insufficient documentation

## 2018-04-04 DIAGNOSIS — R05 Cough: Secondary | ICD-10-CM | POA: Insufficient documentation

## 2018-04-04 DIAGNOSIS — J449 Chronic obstructive pulmonary disease, unspecified: Secondary | ICD-10-CM | POA: Diagnosis not present

## 2018-04-04 DIAGNOSIS — R6889 Other general symptoms and signs: Secondary | ICD-10-CM

## 2018-04-04 DIAGNOSIS — I10 Essential (primary) hypertension: Secondary | ICD-10-CM | POA: Insufficient documentation

## 2018-04-04 DIAGNOSIS — E1165 Type 2 diabetes mellitus with hyperglycemia: Secondary | ICD-10-CM | POA: Insufficient documentation

## 2018-04-04 DIAGNOSIS — M791 Myalgia, unspecified site: Secondary | ICD-10-CM | POA: Diagnosis not present

## 2018-04-04 DIAGNOSIS — I2510 Atherosclerotic heart disease of native coronary artery without angina pectoris: Secondary | ICD-10-CM | POA: Insufficient documentation

## 2018-04-04 LAB — CBG MONITORING, ED: Glucose-Capillary: 315 mg/dL — ABNORMAL HIGH (ref 70–99)

## 2018-04-04 LAB — INFLUENZA PANEL BY PCR (TYPE A & B)
Influenza A By PCR: NEGATIVE
Influenza B By PCR: NEGATIVE

## 2018-04-04 MED ORDER — ACETAMINOPHEN 325 MG PO TABS
650.0000 mg | ORAL_TABLET | Freq: Once | ORAL | Status: AC
  Administered 2018-04-04: 650 mg via ORAL
  Filled 2018-04-04: qty 2

## 2018-04-04 MED ORDER — BENZONATATE 100 MG PO CAPS: 200.0000 mg | ORAL_CAPSULE | Freq: Three times a day (TID) | ORAL | 0 refills | Status: DC | PRN

## 2018-04-04 MED ORDER — INSULIN ASPART 100 UNIT/ML ~~LOC~~ SOLN
10.0000 [IU] | Freq: Once | SUBCUTANEOUS | Status: AC
  Administered 2018-04-04: 10 [IU] via SUBCUTANEOUS
  Filled 2018-04-04: qty 1

## 2018-04-04 NOTE — Discharge Instructions (Addendum)
Rest make sure you are drinking plenty of fluids.  Keep a close watch on your blood sugars as discussed using your sliding scale insulin if needed.  I have prescribed you Tessalon if needed for cough relief.  Continue to take Tylenol or Motrin for fever reduction.  Get rechecked for any worsening symptoms including weakness, shortness of breath, fevers that do not respond to Tylenol or Motrin.

## 2018-04-04 NOTE — ED Notes (Signed)
Pt given ginger ale. Tolerating well.

## 2018-04-04 NOTE — ED Provider Notes (Signed)
[facility] EMERGENCY DEPARTMENT Provider Note   CSN: [id number] Arrival date & time: 04/04/18  1222    History   Chief Complaint Chief Complaint  Patient presents with  . Influenza    HPI GENECIS VELEY is a 63 y.o. female with past medical history as outlined below, most significant for CAD, COPD, hyperlipidemia, MI, sleep apnea and type 2 diabetes presenting with a 1 day history of suspected influenza.  She reports that numerous coworkers went out sick from work yesterday which she was directly exposed to.  Late yesterday afternoon she developed acute onset of generalized myalgias, cough which has been nonproductive and a fever with T-max of 101.1.  Additionally she feels generalized fatigue.  She denies nausea, vomiting or diarrhea.  She does have a mild sore throat, but this developed after she started coughing.  She treated her fever with Tylenol at 9 AM and obtained transient improvement.  She reports having the flu in December and took Tamiflu at that time which was not helpful and is not desirous of taking this medication again.  She denies dizziness, shortness of breath or chest pain.     The history is provided by the patient.    Past Medical History:  Diagnosis Date  . Anxiety   . Aortic stenosis    a. Mild by 2D ECHO 11/17/14  . CAD (coronary artery disease)    a. 10/2014: Ant STEMI s/p DES to dLAD  . Carotid disease, bilateral ([place])    a. Duplex 12/2014: mild-mod atherosclerotic plaque without hemodynamically significant stenosis.  [name] COPD (chronic obstructive pulmonary disease) ([place])   . Essential hypertension   . HLD (hyperlipidemia)   . Morbid obesity ([place])   . Myocardial infarction ([place]) 11/17/2014  . PONV (postoperative nausea and vomiting)   . Shingles   . Sleep apnea    uses CPAP  . Type 2 diabetes mellitus [facility])     Patient Active Problem List   Diagnosis Date Noted  . Morbid obesity ([place]) 01/14/2015  . CAD (coronary artery disease)   . COPD  (chronic obstructive pulmonary disease) ([place])   . Hypertension   . DM type 2 causing vascular disease ([place])   . Current smoker   . HLD (hyperlipidemia)   . Aortic stenosis   . ST elevation (STEMI) myocardial infarction involving left anterior descending coronary artery ([place]) 11/17/2014    Past Surgical History:  Procedure Laterality Date  . ABDOMINAL HYSTERECTOMY    . CARDIAC CATHETERIZATION N/A 11/17/2014   Procedure: Left Heart Cath and Coronary Angiography;  Surgeon: Wellington Hampshire, MD;  Location: [place] CV LAB;  Service: Cardiovascular;  Laterality: N/A;  . CARDIAC CATHETERIZATION N/A 11/17/2014   Procedure: Coronary Stent Intervention;  Surgeon: Wellington Hampshire, MD;  Location: [place] [facility];  Service: Cardiovascular;  Laterality: N/A;  distal lad 2.25x20 promus  . CATARACT EXTRACTION W/PHACO Left 09/20/2016   Procedure: CATARACT EXTRACTION PHACO AND INTRAOCULAR LENS PLACEMENT (IOC);  Surgeon: Tonny Branch, MD;  Location: [facility];  Service: Ophthalmology;  Laterality: Left;  CDE: 9.27  . CATARACT EXTRACTION W/PHACO Right 10/18/2016   Procedure: CATARACT EXTRACTION PHACO AND INTRAOCULAR LENS PLACEMENT (IOC);  Surgeon: Tonny Branch, MD;  Location: [facility];  Service: Ophthalmology;  Laterality: Right;  CDE: 6.38  . CHOLECYSTECTOMY    . SPINAL FUSION     cervical; screws and plates.     OB History    Gravida  1   Para  1   Term  1   Preterm      AB      Living  1     SAB      TAB      Ectopic      Multiple      Live Births               Home Medications    Prior to Admission medications   Medication Sig Start Date End Date Taking? Authorizing Provider  acetaminophen (TYLENOL) 500 MG tablet Take 500 mg by mouth daily as needed for moderate pain or headache.    [provider]  albuterol (PROVENTIL HFA;VENTOLIN HFA) 108 (90 Base) MCG/ACT inhaler Inhale 2 puffs into the lungs every 4 (four) hours as needed for wheezing or shortness of breath.  05/17/15   Francine Graven, DO  albuterol (PROVENTIL) (2.5 MG/3ML) 0.083% nebulizer solution Take 3 mLs (2.5 mg total) by nebulization every 4 (four) hours as needed for wheezing or shortness of breath. 05/17/15   Francine Graven, DO  aspirin EC 81 MG tablet Take 81 mg by mouth daily.    [provider]  atorvastatin (LIPITOR) 10 MG tablet TAKE 1 TABLET (10 MG TOTAL) BY MOUTH EVERY OTHER DAY. TAKE AT [facility] 10/05/17 01/03/18  Satira Sark, MD  benzonatate (TESSALON) 100 MG capsule Take 2 capsules (200 mg total) by mouth 3 (three) times daily as needed. 04/04/18   Evalee Jefferson, PA-C  doxycycline (VIBRAMYCIN) 100 MG capsule Take 1 capsule (100 mg total) by mouth 2 (two) times daily. 01/15/18   Petrucelli, Samantha R, PA-C  escitalopram (LEXAPRO) 20 MG tablet Take 20 mg by mouth 2 (two) times daily.  09/02/14   [provider]  furosemide (LASIX) 20 MG tablet TAKE 1 TABLET BY MOUTH EVERY DAY 05/30/17   Satira Sark, MD  insulin aspart (NOVOLOG) 100 UNIT/ML injection Inject 35 Units into the skin 3 (three) times daily between meals.    [provider]  insulin detemir (LEVEMIR FLEXPEN) 100 UNIT/ML injection Inject 80 Units into the skin 2 (two) times daily.     [provider]  lisinopril (PRINIVIL,ZESTRIL) 10 MG tablet TAKE 1 TABLET BY MOUTH EVERY DAY 05/30/17   Satira Sark, MD  meclizine (ANTIVERT) 25 MG tablet Take 1 tablet (25 mg total) by mouth 3 (three) times daily as needed for dizziness. 08/12/17   Julianne Rice, MD  metFORMIN (GLUCOPHAGE) 1000 MG tablet Take 1,000 mg by mouth 2 (two) times daily.  09/26/14   [provider]  metoprolol tartrate (LOPRESSOR) 25 MG tablet TAKE 1/2 TABLET BY MOUTH TWICE A DAY 03/16/18   Satira Sark, MD  Semaglutide,0.25 or 0.5MG /DOS, (OZEMPIC, 0.25 OR 0.5 MG/DOSE,) 2 MG/1.5ML [facility]  08/19/17   [provider]  traMADol (ULTRAM) 50 MG tablet Take 1 tablet (50 mg total) by mouth every 6 (six) hours  as needed. 04/06/17   Triplett, Tammy, PA-C  UNIFINE PENTIPS 31G X 8 MM MISC USE AS DIRECTED 10 TIMES A DAY 11/04/14   [provider]  Vitamin D, Ergocalciferol, (DRISDOL) 50000 units CAPS capsule Take 50,000 Units by mouth every Saturday. 08/30/16   [provider]    Family History Family History  Problem Relation Age of Onset  . Heart attack Father   . Heart disease Father   . Cancer Brother     Social History Social History   Tobacco Use  . Smoking status: Current Some Day Smoker    Packs/day: 0.25  Years: 30.00    Pack years: 7.50    Types: Cigarettes  . Smokeless tobacco: Never Used  . Tobacco comment: 3 cigs per day  Substance Use Topics  . Alcohol use: No    Alcohol/week: 0.0 standard drinks  . Drug use: No     Allergies   Codeine and Sulfur   Review of Systems Review of Systems  Constitutional: Positive for chills, fatigue and fever.  HENT: Positive for congestion and sore throat.   Eyes: Negative.   Respiratory: Positive for cough. Negative for chest tightness, shortness of breath and wheezing.   Cardiovascular: Negative for chest pain and palpitations.  Gastrointestinal: Negative for abdominal pain, nausea and vomiting.  Genitourinary: Negative.   Musculoskeletal: Positive for myalgias. Negative for arthralgias, joint swelling and neck pain.  Skin: Negative.  Negative for rash and wound.  Neurological: Negative for dizziness, weakness, light-headedness, numbness and headaches.  Psychiatric/Behavioral: Negative.      Physical Exam Updated Vital Signs BP (!) 137/58 (BP Location: Right Arm)   Pulse 71   Temp 97.9 F (36.6 C) (Oral)   Resp 18   Ht 5\' 4"  (1.626 m)   Wt 114.3 kg   SpO2 94%   BMI 43.26 kg/m   Physical Exam Vitals signs and nursing note reviewed.  Constitutional:      Appearance: Normal appearance. She is well-developed.  HENT:     Head: Normocephalic and atraumatic.     Mouth/Throat:     Mouth: Mucous  membranes are moist.     Pharynx: No oropharyngeal exudate or posterior oropharyngeal erythema.  Eyes:     Conjunctiva/sclera: Conjunctivae normal.  Neck:     Musculoskeletal: Normal range of motion.  Cardiovascular:     Rate and Rhythm: Normal rate and regular rhythm.     Heart sounds: Normal heart sounds.     Comments: Occasional dry cough. Pulmonary:     Effort: Pulmonary effort is normal.     Breath sounds: Normal breath sounds. No wheezing.  Abdominal:     General: Bowel sounds are normal.     Palpations: Abdomen is soft.     Tenderness: There is no abdominal tenderness.  Musculoskeletal: Normal range of motion.  Skin:    General: Skin is warm and dry.  Neurological:     Mental Status: She is alert and oriented to person, place, and time.      ED Treatments / Results  Labs (all labs ordered are listed, but only abnormal results are displayed) Labs Reviewed  CBG MONITORING, ED - Abnormal; Notable for the following components:      Result Value   Glucose-Capillary 315 (*)    All other components within normal limits  INFLUENZA PANEL BY PCR (TYPE A & B)    EKG None  Radiology Dg Chest 2 View  Result Date: 04/04/2018 CLINICAL DATA:  63 year old female with nonproductive cough, congestion, shortness of breath and fever since yesterday. Smoker. EXAM: CHEST - 2 VIEW COMPARISON:  01/15/2018 and earlier. FINDINGS: Stable lung volumes and mediastinal contours. Cardiac size at the upper limits of normal. Visualized tracheal air column is within normal limits. Chronic increased interstitial markings with no progression since 2019. No pneumothorax, pleural effusion or new pulmonary opacity. No acute osseous abnormality identified. Prior cervical ACDF. Probable cholecystectomy clips in the upper abdomen. Negative visible bowel gas pattern. IMPRESSION: Stable chronic lung disease. No superimposed acute findings are identified. Electronically Signed   By: Genevie Ann M.D.   On: 04/04/2018  15:15    Procedures Procedures (including critical care time)  Medications Ordered in ED Medications  insulin aspart (novoLOG) injection 10 Units (has no administration in time range)  acetaminophen (TYLENOL) tablet 650 mg (650 mg Oral Given 04/04/18 1508)     Initial Impression / Assessment and Plan / ED Course  I have reviewed the triage vital signs and the nursing notes.  Pertinent labs & imaging results that were available during my care of the patient were reviewed by me and considered in my medical decision making (see chart for details).        Imaging and labs reviewed and discussed with patient.  She is on sliding scale insulin at home and states with her current CBG of 315 she would take 10 units of insulin.  This was ordered for her.  Vital signs are stable and she tolerated p.o. challenge here.  Patient with flulike symptoms but negative influenza screen today.  I am suspicious that this test result is erroneous given her exposure and current symptoms.  She was stable however, vital signs stable, she tolerated p.o. fluid challenge here.  Chest x-ray negative for acute lung infection.  Discussed home treatment including rest, increase fluids, close check on her CBGs, she states she checks her blood sugars 3 times daily and takes oral and insulin including sliding scale as needed.  Strict return precautions discussed.  PRN follow-up anticipated.  Final Clinical Impressions(s) / ED Diagnoses   Final diagnoses:  Flu-like symptoms  Hyperglycemia    ED Discharge Orders         Ordered    benzonatate (TESSALON) 100 MG capsule  3 times daily PRN     04/04/18 1552           Evalee Jefferson, PA-C 04/04/18 1603    Veryl Speak, MD 04/05/18 [phone]

## 2018-04-04 NOTE — ED Triage Notes (Signed)
C/o cough (non-productive), and fever (101.1 at home), took tylenol at 0900 this am.  Temp in triage 97.9.

## 2018-05-18 ENCOUNTER — Other Ambulatory Visit (HOSPITAL_COMMUNITY): Payer: Self-pay

## 2018-05-18 DIAGNOSIS — R590 Localized enlarged lymph nodes: Secondary | ICD-10-CM

## 2018-05-23 ENCOUNTER — Ambulatory Visit (HOSPITAL_COMMUNITY)
Admission: RE | Admit: 2018-05-23 | Discharge: 2018-05-23 | Disposition: A | Discharge status: Home / Self Care | Payer: BLUE CROSS/BLUE SHIELD | Source: Ambulatory Visit | Attending: Internal Medicine | Admitting: Internal Medicine

## 2018-05-23 ENCOUNTER — Encounter (HOSPITAL_COMMUNITY): Payer: Self-pay

## 2018-05-23 ENCOUNTER — Ambulatory Visit (HOSPITAL_COMMUNITY): Payer: BLUE CROSS/BLUE SHIELD

## 2018-05-23 ENCOUNTER — Other Ambulatory Visit: Payer: Self-pay

## 2018-05-23 DIAGNOSIS — R590 Localized enlarged lymph nodes: Secondary | ICD-10-CM

## 2018-06-23 ENCOUNTER — Telehealth: Payer: Self-pay | Admitting: Cardiology

## 2018-06-23 DIAGNOSIS — E782 Mixed hyperlipidemia: Secondary | ICD-10-CM

## 2018-06-23 NOTE — Telephone Encounter (Signed)
Mailed lab slip to pt, will have done at [facility]

## 2018-06-23 NOTE — Telephone Encounter (Signed)
Patient has upcoming appointment with Dr. Domenic Polite. She states that she needs to have her blood work done before appointment.   Please call [phone].

## 2018-06-23 NOTE — Telephone Encounter (Signed)
Get FLP and LFTs, can discuss medications at follow-up.

## 2018-06-23 NOTE — Telephone Encounter (Signed)
Pt seen Sept 2019, said Dr.Shah never did labs on her in October, requesting labs now.States joints ache from statin use

## 2018-06-26 ENCOUNTER — Other Ambulatory Visit: Payer: Self-pay | Admitting: Cardiology

## 2018-07-12 ENCOUNTER — Telehealth: Payer: BLUE CROSS/BLUE SHIELD | Admitting: Cardiology

## 2018-07-18 ENCOUNTER — Telehealth: Payer: BC Managed Care – PPO | Admitting: Physician Assistant

## 2018-07-18 DIAGNOSIS — R509 Fever, unspecified: Secondary | ICD-10-CM

## 2018-07-18 NOTE — Progress Notes (Signed)
Patient with a multitude of risk factors writing in with complaints of mild SOB and fever.  Dispo: ED for further evaluation and management.   E-Visit for [employer] Virus Screening  Based on what you have shared with me, you need to seek an evaluation for a severe illness that is causing your symptoms which may be coronavirus or some other illness. I recommend that you be seen and evaluated "face to face". Our Emergency Departments are best equipped to handle patients with severe symptoms.  You will be evaluated by the ER provider (or higher level of care provider) who will determine whether you need formal testing.   I recommend the following:  If you are having a true medical emergency please call 911. If you are considered high risk for Corona virus because of a known exposure, fever, shortness of breath and cough, OR if you have severe symptoms of any kind, seek medical care at an emergency room.   [facility] Emergency Department [place], [place], [place] [address] [phone]  [facility] Emergency Department [place], [place], [place] [address] [facility] Emergency Department [place], [place], [place] [address] [phone]  [facility] Emergency Department [place], [place], [place] [address] [facility] Emergency Department [place], [place], [place] [address] [phone]  NOTE: If you entered your credit card information for this eVisit, you will not be charged. You may see a "hold" on your card for the $35 but that hold will drop off and you will not have a charge processed.   Your e-visit answers were reviewed by a board certified advanced clinical practitioner to complete your personal care plan.  Thank you for using e-Visits.  ===View-only below this line===   ----- Message -----    From:  [name]    Sent: 07/18/2018 10:27 AM EDT      To: E-Visit Mailing List Subject: CoronaVirus (IWLNL-89) Screening  CoronaVirus (QJJHE-17) Screening --------------------------------  Question: Do you have any of the following?  Answer:   Shortness of breath  Question: If you are experiencing trouble breathing please select the severity of this:  Answer:   I have mild trouble breathing but not very often  Question: Do you have any of the following additional symptoms?  Answer:   Sore throat            Headache  Question: Have you had a fever? Answer:   Yes  Question: If you are running a fever, please type in your temperature reading Answer:   100.1  Question: How long have you had the fever? Answer:   Just today  Question: Have others in your home or workplace had similar symptoms? Answer:   No  Question: When did your symptoms start? Answer:   07-18-2018  Question: Have you recently visited any of the following countries? Answer:   None of these  Question: If you have traveled anywhere in the last  2 months please document where you have visited: Answer:     Question: Have you recently been around others from these countries or visited these countries who have had coughing or fever? Answer:   No  Question: Have you recently been around anyone who has been diagnosed with Corona virus? Answer:   No  Question: Have you been taking any medications? Answer:   No  Question: If  taking medications for these symptoms, please list the names and whether they are helping or not Answer:     Question: Are you treated for any of the following conditions: Asthma, COPD, Diabetes, Renal Failure (on Dialysis), AIDS, any Neuromuscular disease that effects the clearing of secretions, Heart Failure, or Heart Disease? Answer:   Yes  Question: Please enter a phone number where you can be reached if we have additional questions about your symptoms Answer:   [phone]  Question:  Please list your medication allergies that you may have ? (If 'none' , please list as 'none') Answer:   Codine, sulfur  Question: Please list any additional comments  Answer:     A total of 5-10 minutes was spent evaluating this patients questionnaire and formulating a plan of care.

## 2018-07-19 ENCOUNTER — Other Ambulatory Visit: Payer: Self-pay

## 2018-07-19 ENCOUNTER — Emergency Department (HOSPITAL_COMMUNITY)
Admission: EM | Admit: 2018-07-19 | Discharge: 2018-07-19 | Disposition: A | Discharge status: Home / Self Care | Payer: BC Managed Care – PPO | Attending: Emergency Medicine | Admitting: Emergency Medicine

## 2018-07-19 ENCOUNTER — Emergency Department (HOSPITAL_COMMUNITY): Payer: BC Managed Care – PPO

## 2018-07-19 ENCOUNTER — Encounter (HOSPITAL_COMMUNITY): Payer: Self-pay

## 2018-07-19 DIAGNOSIS — Z7982 Long term (current) use of aspirin: Secondary | ICD-10-CM | POA: Insufficient documentation

## 2018-07-19 DIAGNOSIS — J441 Chronic obstructive pulmonary disease with (acute) exacerbation: Secondary | ICD-10-CM | POA: Diagnosis not present

## 2018-07-19 DIAGNOSIS — E1151 Type 2 diabetes mellitus with diabetic peripheral angiopathy without gangrene: Secondary | ICD-10-CM | POA: Diagnosis not present

## 2018-07-19 DIAGNOSIS — R0602 Shortness of breath: Secondary | ICD-10-CM | POA: Diagnosis present

## 2018-07-19 DIAGNOSIS — F17210 Nicotine dependence, cigarettes, uncomplicated: Secondary | ICD-10-CM | POA: Diagnosis not present

## 2018-07-19 DIAGNOSIS — Z794 Long term (current) use of insulin: Secondary | ICD-10-CM | POA: Diagnosis not present

## 2018-07-19 DIAGNOSIS — I259 Chronic ischemic heart disease, unspecified: Secondary | ICD-10-CM | POA: Diagnosis not present

## 2018-07-19 DIAGNOSIS — I10 Essential (primary) hypertension: Secondary | ICD-10-CM | POA: Diagnosis not present

## 2018-07-19 DIAGNOSIS — Z20828 Contact with and (suspected) exposure to other viral communicable diseases: Secondary | ICD-10-CM | POA: Insufficient documentation

## 2018-07-19 DIAGNOSIS — Z79899 Other long term (current) drug therapy: Secondary | ICD-10-CM | POA: Diagnosis not present

## 2018-07-19 DIAGNOSIS — Z720 Tobacco use: Secondary | ICD-10-CM

## 2018-07-19 LAB — CBC WITH DIFFERENTIAL/PLATELET
Abs Immature Granulocytes: 0.04 10*3/uL (ref 0.00–0.07)
Basophils Absolute: 0.1 10*3/uL (ref 0.0–0.1)
Basophils Relative: 1 %
Eosinophils Absolute: 0.2 10*3/uL (ref 0.0–0.5)
Eosinophils Relative: 3 %
HCT: 48.9 % — ABNORMAL HIGH (ref 36.0–46.0)
Hemoglobin: 15.2 g/dL — ABNORMAL HIGH (ref 12.0–15.0)
Immature Granulocytes: 1 %
Lymphocytes Relative: 18 %
Lymphs Abs: 1.2 10*3/uL (ref 0.7–4.0)
MCH: 29 pg (ref 26.0–34.0)
MCHC: 31.1 g/dL (ref 30.0–36.0)
MCV: 93.1 fL (ref 80.0–100.0)
Monocytes Absolute: 0.6 10*3/uL (ref 0.1–1.0)
Monocytes Relative: 8 %
Neutro Abs: 4.8 10*3/uL (ref 1.7–7.7)
Neutrophils Relative %: 69 %
Platelets: 214 10*3/uL (ref 150–400)
RBC: 5.25 MIL/uL — ABNORMAL HIGH (ref 3.87–5.11)
RDW: 15.7 % — ABNORMAL HIGH (ref 11.5–15.5)
WBC: 6.8 10*3/uL (ref 4.0–10.5)
nRBC: 0 % (ref 0.0–0.2)

## 2018-07-19 LAB — SARS CORONAVIRUS 2 BY RT PCR (HOSPITAL ORDER, PERFORMED IN ~~LOC~~ HOSPITAL LAB): SARS Coronavirus 2: NEGATIVE

## 2018-07-19 LAB — BASIC METABOLIC PANEL
Anion gap: 14 (ref 5–15)
BUN: 17 mg/dL (ref 8–23)
CO2: 25 mmol/L (ref 22–32)
Calcium: 8.7 mg/dL — ABNORMAL LOW (ref 8.9–10.3)
Chloride: 99 mmol/L (ref 98–111)
Creatinine, Ser: 0.76 mg/dL (ref 0.44–1.00)
GFR calc Af Amer: >60 mL/min (ref >60)
GFR calc non Af Amer: >60 mL/min (ref >60)
Glucose, Bld: 202 mg/dL — ABNORMAL HIGH (ref 70–99)
Potassium: 4.1 mmol/L (ref 3.5–5.1)
Sodium: 138 mmol/L (ref 135–145)

## 2018-07-19 LAB — TROPONIN I (HIGH SENSITIVITY)
Troponin I (High Sensitivity): 7 ng/L (ref <18)
Troponin I (High Sensitivity): 6 ng/L (ref <18)

## 2018-07-19 LAB — LACTIC ACID, PLASMA
Lactic Acid, Venous: 1.4 mmol/L (ref 0.5–1.9)
Lactic Acid, Venous: 1.2 mmol/L (ref 0.5–1.9)

## 2018-07-19 LAB — BRAIN NATRIURETIC PEPTIDE: B Natriuretic Peptide: 103 pg/mL — ABNORMAL HIGH (ref 0.0–100.0)

## 2018-07-19 MED ORDER — AEROCHAMBER Z-STAT PLUS/MEDIUM MISC
1.0000 | Freq: Once | Status: AC
  Administered 2018-07-19: 1
  Filled 2018-07-19: qty 1

## 2018-07-19 MED ORDER — PREDNISONE 20 MG PO TABS: ORAL_TABLET | ORAL | 0 refills | Status: DC

## 2018-07-19 MED ORDER — DOXYCYCLINE HYCLATE 100 MG PO CAPS: 100.0000 mg | ORAL_CAPSULE | Freq: Two times a day (BID) | ORAL | 0 refills | Status: DC

## 2018-07-19 MED ORDER — METHYLPREDNISOLONE SODIUM SUCC 125 MG IJ SOLR
125.0000 mg | Freq: Once | INTRAMUSCULAR | Status: AC
  Administered 2018-07-19: 125 mg via INTRAVENOUS
  Filled 2018-07-19: qty 2

## 2018-07-19 MED ORDER — ALBUTEROL SULFATE HFA 108 (90 BASE) MCG/ACT IN AERS
4.0000 | INHALATION_SPRAY | Freq: Once | RESPIRATORY_TRACT | Status: AC
  Administered 2018-07-19: 4 via RESPIRATORY_TRACT
  Filled 2018-07-19: qty 6.7

## 2018-07-19 MED ORDER — SODIUM CHLORIDE 0.9 % IV BOLUS
1000.0000 mL | Freq: Once | INTRAVENOUS | Status: AC
  Administered 2018-07-19: 1000 mL via INTRAVENOUS

## 2018-07-19 NOTE — Discharge Instructions (Signed)
Return for worsening shortness of breath, persistent fevers or new concerns. Use your albuterol as needed every 3-4 hours. Take steroids and antibiotics as directed for 5 days. Your COVID test was negative.

## 2018-07-19 NOTE — ED Triage Notes (Signed)
Pt reports shortness of breath that started this morning around 1 am this morning. Pt also reports fever of 100 at home, pt took tylenol at 4 am. Pt reports headache as well. Pt had nausea yesterday. Pt does not have fever in triage.

## 2018-07-19 NOTE — ED Provider Notes (Signed)
Patient's care signed out this morning to follow-up results and reassess.  Patient had low-grade fever mild cough and mild shortness of breath.  Patient has COPD history and has albuterol at home.  Patient is not on home oxygen.  On reassessment patient sitting up comfortable, mild tachypnea.  Patient says she feels significant improved and comfortable going home.  Strict reasons to return given.  Patient's COVID test was negative, blood work reviewed no acute abnormalities negative troponin.  Chest x-ray no signs of infiltrate or heart failure.  Patient's oxygen 99-100% on reassessment.  Plan for atypical antibiotic coverage steroids and patient has albuterol at home. Labs Reviewed  BASIC METABOLIC PANEL - Abnormal; Notable for the following components:      Result Value   Glucose, Bld 202 (*)    Calcium 8.7 (*)    All other components within normal limits  BRAIN NATRIURETIC PEPTIDE - Abnormal; Notable for the following components:   B Natriuretic Peptide 103.0 (*)    All other components within normal limits  CBC WITH DIFFERENTIAL/PLATELET - Abnormal; Notable for the following components:   RBC 5.25 (*)    Hemoglobin 15.2 (*)    HCT 48.9 (*)    RDW 15.7 (*)    All other components within normal limits  SARS CORONAVIRUS 2 (HOSPITAL ORDER, [place] [facility])  CULTURE, BLOOD (ROUTINE X 2)  CULTURE, BLOOD (ROUTINE X 2)  TROPONIN I (HIGH SENSITIVITY)  TROPONIN I (HIGH SENSITIVITY)  LACTIC ACID, PLASMA  LACTIC ACID, PLASMA    [name] 8:48 AM    Elnora Morrison, MD 07/19/18 [phone]

## 2018-07-19 NOTE — ED Provider Notes (Signed)
[facility] EMERGENCY DEPARTMENT Provider Note   CSN: [id number] Arrival date & time: 07/19/18  0534    History   Chief Complaint Chief Complaint  Patient presents with  . Shortness of Breath    HPI [name] is a 63 y.o. female.     Pt presents to the ED today with fever, cough, sob.  The pt said she developed a fever yesterday.  She woke up this morning around 0100 with fever and sob.  She took a neb.  Around 0400, she took a tylenol.  The pt also has a headache.  No known covid exposures.  Looking at [place], she called her doctor who told her to come to the ED if sx worsen as she may have covid.     Past Medical History:  Diagnosis Date  . Anxiety   . Aortic stenosis    a. Mild by 2D ECHO 11/17/14  . CAD (coronary artery disease)    a. 10/2014: Ant STEMI s/p DES to dLAD  . Carotid disease, bilateral ([place])    a. Duplex 12/2014: mild-mod atherosclerotic plaque without hemodynamically significant stenosis.  [name] COPD (chronic obstructive pulmonary disease) ([place])   . Essential hypertension   . HLD (hyperlipidemia)   . Morbid obesity ([place])   . Myocardial infarction ([place]) 11/17/2014  . PONV (postoperative nausea and vomiting)   . Shingles   . Sleep apnea    uses CPAP  . Type 2 diabetes mellitus [facility])     Patient Active Problem List   Diagnosis Date Noted  . Morbid obesity ([place]) 01/14/2015  . CAD (coronary artery disease)   . COPD (chronic obstructive pulmonary disease) ([place])   . Hypertension   . DM type 2 causing vascular disease ([place])   . Current smoker   . HLD (hyperlipidemia)   . Aortic stenosis   . ST elevation (STEMI) myocardial infarction involving left anterior descending coronary artery ([place]) 11/17/2014    Past Surgical History:  Procedure Laterality Date  . ABDOMINAL HYSTERECTOMY    . BREAST BIOPSY Left    2016  . CARDIAC CATHETERIZATION N/A 11/17/2014   Procedure: Left Heart Cath and Coronary Angiography;  Surgeon: Wellington Hampshire, MD;   Location: [place] [facility];  Service: Cardiovascular;  Laterality: N/A;  . CARDIAC CATHETERIZATION N/A 11/17/2014   Procedure: Coronary Stent Intervention;  Surgeon: Wellington Hampshire, MD;  Location: [place] [facility];  Service: Cardiovascular;  Laterality: N/A;  distal lad 2.25x20 promus  . CATARACT EXTRACTION W/PHACO Left 09/20/2016   Procedure: CATARACT EXTRACTION PHACO AND INTRAOCULAR LENS PLACEMENT (IOC);  Surgeon: Tonny Branch, MD;  Location: [facility];  Service: Ophthalmology;  Laterality: Left;  CDE: 9.27  . CATARACT EXTRACTION W/PHACO Right 10/18/2016   Procedure: CATARACT EXTRACTION PHACO AND INTRAOCULAR LENS PLACEMENT (IOC);  Surgeon: Tonny Branch, MD;  Location: [facility];  Service: Ophthalmology;  Laterality: Right;  CDE: 6.38  . CHOLECYSTECTOMY    . SPINAL FUSION     cervical; screws and plates.     OB History    Gravida  1   Para  1   Term  1   Preterm      AB      Living  1     SAB      TAB      Ectopic      Multiple      Live Births               Home Medications  Prior to Admission medications   Medication Sig Start Date End Date Taking? Authorizing Provider  acetaminophen (TYLENOL) 500 MG tablet Take 500 mg by mouth daily as needed for moderate pain or headache.    [provider]  albuterol (PROVENTIL HFA;VENTOLIN HFA) 108 (90 Base) MCG/ACT inhaler Inhale 2 puffs into the lungs every 4 (four) hours as needed for wheezing or shortness of breath. 05/17/15   Francine Graven, DO  albuterol (PROVENTIL) (2.5 MG/3ML) 0.083% nebulizer solution Take 3 mLs (2.5 mg total) by nebulization every 4 (four) hours as needed for wheezing or shortness of breath. 05/17/15   Francine Graven, DO  aspirin EC 81 MG tablet Take 81 mg by mouth daily.    [provider]  atorvastatin (LIPITOR) 10 MG tablet TAKE 1 TABLET (10 MG TOTAL) BY MOUTH EVERY OTHER DAY. TAKE AT [facility] 10/05/17 01/03/18  Satira Sark, MD  benzonatate (TESSALON) 100 MG capsule Take 2  capsules (200 mg total) by mouth 3 (three) times daily as needed. 04/04/18   Evalee Jefferson, PA-C  doxycycline (VIBRAMYCIN) 100 MG capsule Take 1 capsule (100 mg total) by mouth 2 (two) times daily. 01/15/18   Petrucelli, Samantha R, PA-C  escitalopram (LEXAPRO) 20 MG tablet Take 20 mg by mouth 2 (two) times daily.  09/02/14   [provider]  furosemide (LASIX) 20 MG tablet TAKE 1 TABLET BY MOUTH EVERY DAY 06/26/18   Satira Sark, MD  insulin aspart (NOVOLOG) 100 UNIT/ML injection Inject 35 Units into the skin 3 (three) times daily between meals.    [provider]  insulin detemir (LEVEMIR FLEXPEN) 100 UNIT/ML injection Inject 80 Units into the skin 2 (two) times daily.     [provider]  lisinopril (ZESTRIL) 10 MG tablet TAKE 1 TABLET BY MOUTH EVERY DAY 06/26/18   Satira Sark, MD  meclizine (ANTIVERT) 25 MG tablet Take 1 tablet (25 mg total) by mouth 3 (three) times daily as needed for dizziness. 08/12/17   Julianne Rice, MD  metFORMIN (GLUCOPHAGE) 1000 MG tablet Take 1,000 mg by mouth 2 (two) times daily.  09/26/14   [provider]  metoprolol tartrate (LOPRESSOR) 25 MG tablet TAKE 1/2 TABLET BY MOUTH TWICE A DAY 03/16/18   Satira Sark, MD  Semaglutide,0.25 or 0.5MG /DOS, (OZEMPIC, 0.25 OR 0.5 MG/DOSE,) 2 MG/1.5ML [facility]  08/19/17   [provider]  traMADol (ULTRAM) 50 MG tablet Take 1 tablet (50 mg total) by mouth every 6 (six) hours as needed. 04/06/17   Triplett, Tammy, PA-C  UNIFINE PENTIPS 31G X 8 MM MISC USE AS DIRECTED 10 TIMES A DAY 11/04/14   [provider]  Vitamin D, Ergocalciferol, (DRISDOL) 50000 units CAPS capsule Take 50,000 Units by mouth every Saturday. 08/30/16   [provider]    Family History Family History  Problem Relation Age of Onset  . Heart attack Father   . Heart disease Father   . Cancer Brother     Social History Social History   Tobacco Use  . Smoking status: Current Some Day Smoker     Packs/day: 0.25    Years: 30.00    Pack years: 7.50    Types: Cigarettes  . Smokeless tobacco: Never Used  . Tobacco comment: 3 cigs per day  Substance Use Topics  . Alcohol use: No    Alcohol/week: 0.0 standard drinks  . Drug use: No     Allergies   Codeine and Sulfur   Review of Systems Review of  Systems  Constitutional: Positive for fever.  Respiratory: Positive for cough and shortness of breath.   Neurological: Positive for headaches.  All other systems reviewed and are negative.    Physical Exam Updated Vital Signs BP 106/66 (BP Location: Left Arm)   Pulse 79   Temp 98.5 F (36.9 C) (Oral)   Resp (!) 24   Ht 5\' 4"  (1.626 m)   Wt 122.9 kg   SpO2 93%   BMI 46.52 kg/m   Physical Exam Vitals signs and nursing note reviewed.  Constitutional:      Appearance: She is well-developed.  HENT:     Head: Normocephalic and atraumatic.     Mouth/Throat:     Mouth: Mucous membranes are moist.     Pharynx: Oropharynx is clear.  Eyes:     Extraocular Movements: Extraocular movements intact.     Pupils: Pupils are equal, round, and reactive to light.  Neck:     Musculoskeletal: Normal range of motion and neck supple.  Cardiovascular:     Rate and Rhythm: Normal rate and regular rhythm.  Pulmonary:     Effort: Pulmonary effort is normal.     Breath sounds: Wheezing present.  Abdominal:     General: Bowel sounds are normal.     Palpations: Abdomen is soft.  Musculoskeletal: Normal range of motion.  Skin:    General: Skin is warm.     Capillary Refill: Capillary refill takes less than 2 seconds.  Neurological:     General: No focal deficit present.     Mental Status: She is alert and oriented to person, place, and time.  Psychiatric:        Mood and Affect: Mood normal.        Behavior: Behavior normal.      ED Treatments / Results  Labs (all labs ordered are listed, but only abnormal results are displayed) Labs Reviewed  CULTURE, BLOOD (ROUTINE X 2)   CULTURE, BLOOD (ROUTINE X 2)  SARS CORONAVIRUS 2 (HOSPITAL ORDER, [place] [facility])  BASIC METABOLIC PANEL  BRAIN NATRIURETIC PEPTIDE  CBC WITH DIFFERENTIAL/PLATELET  TROPONIN I (HIGH SENSITIVITY)  TROPONIN I (HIGH SENSITIVITY)  LACTIC ACID, PLASMA  LACTIC ACID, PLASMA    EKG None  Radiology Dg Chest Port 1 View  Result Date: 07/19/2018 CLINICAL DATA:  Shortness of breath EXAM: PORTABLE CHEST 1 VIEW COMPARISON:  04/04/2018 FINDINGS: Chronic interstitial coarsening. There is no edema, consolidation, effusion, or pneumothorax. Borderline heart size that is stable ACDF hardware. IMPRESSION: No acute finding when compared to priors. Electronically Signed   By: Monte Fantasia M.D.   On: 07/19/2018 06:24    Procedures Procedures (including critical care time)  Medications Ordered in ED Medications  albuterol (VENTOLIN HFA) 108 (90 Base) MCG/ACT inhaler 4 puff (has no administration in time range)  aerochamber Z-Stat Plus/medium 1 each (has no administration in time range)  methylPREDNISolone sodium succinate (SOLU-MEDROL) 125 mg/2 mL injection 125 mg (has no administration in time range)  sodium chloride 0.9 % bolus 1,000 mL (1,000 mLs Intravenous New Bag/Given 07/19/18 [id number])     Initial Impression / Assessment and Plan / ED Course  I have reviewed the triage vital signs and the nursing notes.  Pertinent labs & imaging results that were available during my care of the patient were reviewed by me and considered in my medical decision making (see chart for details).       Pt is worried she has covid and has been  sob.  The rapid covid test has been ordered in case she needs admission and some nebs.  This is pending at shift change.  CXR shows no pna.  Labs are pending at shift change also.  Pt given solumedrol and albuterol inhaler for her wheezing.  No fever now, but she just took tylenol at 0400.  Pt signed out to Dr. Reather Converse at shift change.  [name] was evaluated in Emergency Department on 07/19/2018 for the symptoms described in the history of present illness. She was evaluated in the context of the global COVID-19 pandemic, which necessitated consideration that the patient might be at risk for infection with the SARS-CoV-2 virus that causes COVID-19. Institutional protocols and algorithms that pertain to the evaluation of patients at risk for COVID-19 are in a state of rapid change based on information released by regulatory bodies including the CDC and federal and state organizations. These policies and algorithms were followed during the patient's care in the ED.  Final Clinical Impressions(s) / ED Diagnoses   Final diagnoses:  Tobacco abuse  COPD exacerbation [facility])    ED Discharge Orders    None       Isla Pence, MD 07/19/18 [phone]

## 2018-07-24 LAB — CULTURE, BLOOD (ROUTINE X 2)
Culture: NO GROWTH
Culture: NO GROWTH
Special Requests: ADEQUATE
Special Requests: ADEQUATE

## 2018-08-25 ENCOUNTER — Inpatient Hospital Stay (HOSPITAL_COMMUNITY)
Admission: EM | Admit: 2018-08-25 | Discharge: 2018-08-25 | DRG: 602 | Disposition: A | Discharge status: Home / Self Care | Payer: BC Managed Care – PPO | Attending: Internal Medicine | Admitting: Internal Medicine

## 2018-08-25 ENCOUNTER — Other Ambulatory Visit: Payer: Self-pay

## 2018-08-25 ENCOUNTER — Emergency Department (HOSPITAL_COMMUNITY): Payer: BC Managed Care – PPO

## 2018-08-25 ENCOUNTER — Encounter (HOSPITAL_COMMUNITY): Payer: Self-pay | Admitting: Emergency Medicine

## 2018-08-25 ENCOUNTER — Inpatient Hospital Stay (HOSPITAL_COMMUNITY): Payer: BC Managed Care – PPO

## 2018-08-25 DIAGNOSIS — I5022 Chronic systolic (congestive) heart failure: Secondary | ICD-10-CM | POA: Diagnosis not present

## 2018-08-25 DIAGNOSIS — Z981 Arthrodesis status: Secondary | ICD-10-CM

## 2018-08-25 DIAGNOSIS — J9811 Atelectasis: Secondary | ICD-10-CM | POA: Diagnosis present

## 2018-08-25 DIAGNOSIS — Z7982 Long term (current) use of aspirin: Secondary | ICD-10-CM

## 2018-08-25 DIAGNOSIS — N390 Urinary tract infection, site not specified: Secondary | ICD-10-CM | POA: Diagnosis present

## 2018-08-25 DIAGNOSIS — E876 Hypokalemia: Secondary | ICD-10-CM | POA: Diagnosis not present

## 2018-08-25 DIAGNOSIS — I5031 Acute diastolic (congestive) heart failure: Secondary | ICD-10-CM | POA: Diagnosis not present

## 2018-08-25 DIAGNOSIS — M549 Dorsalgia, unspecified: Secondary | ICD-10-CM | POA: Insufficient documentation

## 2018-08-25 DIAGNOSIS — R601 Generalized edema: Secondary | ICD-10-CM

## 2018-08-25 DIAGNOSIS — E662 Morbid (severe) obesity with alveolar hypoventilation: Secondary | ICD-10-CM | POA: Diagnosis present

## 2018-08-25 DIAGNOSIS — L03311 Cellulitis of abdominal wall: Secondary | ICD-10-CM | POA: Diagnosis not present

## 2018-08-25 DIAGNOSIS — F419 Anxiety disorder, unspecified: Secondary | ICD-10-CM | POA: Insufficient documentation

## 2018-08-25 DIAGNOSIS — F17210 Nicotine dependence, cigarettes, uncomplicated: Secondary | ICD-10-CM | POA: Diagnosis present

## 2018-08-25 DIAGNOSIS — Z955 Presence of coronary angioplasty implant and graft: Secondary | ICD-10-CM

## 2018-08-25 DIAGNOSIS — Z20828 Contact with and (suspected) exposure to other viral communicable diseases: Secondary | ICD-10-CM | POA: Diagnosis present

## 2018-08-25 DIAGNOSIS — F329 Major depressive disorder, single episode, unspecified: Secondary | ICD-10-CM | POA: Diagnosis present

## 2018-08-25 DIAGNOSIS — I252 Old myocardial infarction: Secondary | ICD-10-CM | POA: Diagnosis not present

## 2018-08-25 DIAGNOSIS — R197 Diarrhea, unspecified: Secondary | ICD-10-CM | POA: Diagnosis present

## 2018-08-25 DIAGNOSIS — Z5329 Procedure and treatment not carried out because of patient's decision for other reasons: Secondary | ICD-10-CM | POA: Diagnosis not present

## 2018-08-25 DIAGNOSIS — J449 Chronic obstructive pulmonary disease, unspecified: Secondary | ICD-10-CM | POA: Diagnosis present

## 2018-08-25 DIAGNOSIS — I5033 Acute on chronic diastolic (congestive) heart failure: Secondary | ICD-10-CM | POA: Diagnosis present

## 2018-08-25 DIAGNOSIS — I110 Hypertensive heart disease with heart failure: Secondary | ICD-10-CM | POA: Diagnosis present

## 2018-08-25 DIAGNOSIS — J9601 Acute respiratory failure with hypoxia: Secondary | ICD-10-CM | POA: Diagnosis not present

## 2018-08-25 DIAGNOSIS — Z882 Allergy status to sulfonamides status: Secondary | ICD-10-CM | POA: Diagnosis not present

## 2018-08-25 DIAGNOSIS — R7989 Other specified abnormal findings of blood chemistry: Secondary | ICD-10-CM | POA: Diagnosis not present

## 2018-08-25 DIAGNOSIS — I2510 Atherosclerotic heart disease of native coronary artery without angina pectoris: Secondary | ICD-10-CM | POA: Diagnosis present

## 2018-08-25 DIAGNOSIS — K760 Fatty (change of) liver, not elsewhere classified: Secondary | ICD-10-CM | POA: Diagnosis present

## 2018-08-25 DIAGNOSIS — R1084 Generalized abdominal pain: Secondary | ICD-10-CM

## 2018-08-25 DIAGNOSIS — E785 Hyperlipidemia, unspecified: Secondary | ICD-10-CM | POA: Diagnosis present

## 2018-08-25 DIAGNOSIS — J90 Pleural effusion, not elsewhere classified: Secondary | ICD-10-CM

## 2018-08-25 DIAGNOSIS — R0902 Hypoxemia: Secondary | ICD-10-CM

## 2018-08-25 DIAGNOSIS — R188 Other ascites: Secondary | ICD-10-CM

## 2018-08-25 DIAGNOSIS — Z6841 Body Mass Index (BMI) 40.0 and over, adult: Secondary | ICD-10-CM

## 2018-08-25 DIAGNOSIS — I350 Nonrheumatic aortic (valve) stenosis: Secondary | ICD-10-CM | POA: Diagnosis present

## 2018-08-25 DIAGNOSIS — E1159 Type 2 diabetes mellitus with other circulatory complications: Secondary | ICD-10-CM | POA: Diagnosis present

## 2018-08-25 DIAGNOSIS — Z794 Long term (current) use of insulin: Secondary | ICD-10-CM

## 2018-08-25 DIAGNOSIS — I10 Essential (primary) hypertension: Secondary | ICD-10-CM | POA: Diagnosis present

## 2018-08-25 LAB — CBC WITH DIFFERENTIAL/PLATELET
Abs Immature Granulocytes: 0.02 10*3/uL (ref 0.00–0.07)
Basophils Absolute: 0 10*3/uL (ref 0.0–0.1)
Basophils Relative: 1 %
Eosinophils Absolute: 0.1 10*3/uL (ref 0.0–0.5)
Eosinophils Relative: 2 %
HCT: 45.1 % (ref 36.0–46.0)
Hemoglobin: 13.7 g/dL (ref 12.0–15.0)
Immature Granulocytes: 0 %
Lymphocytes Relative: 23 %
Lymphs Abs: 1.1 10*3/uL (ref 0.7–4.0)
MCH: 29.1 pg (ref 26.0–34.0)
MCHC: 30.4 g/dL (ref 30.0–36.0)
MCV: 96 fL (ref 80.0–100.0)
Monocytes Absolute: 0.6 10*3/uL (ref 0.1–1.0)
Monocytes Relative: 11 %
Neutro Abs: 3.2 10*3/uL (ref 1.7–7.7)
Neutrophils Relative %: 63 %
Platelets: 252 10*3/uL (ref 150–400)
RBC: 4.7 MIL/uL (ref 3.87–5.11)
RDW: 16.1 % — ABNORMAL HIGH (ref 11.5–15.5)
WBC: 5 10*3/uL (ref 4.0–10.5)
nRBC: 0 % (ref 0.0–0.2)

## 2018-08-25 LAB — URINALYSIS, ROUTINE W REFLEX MICROSCOPIC
Bilirubin Urine: NEGATIVE
Glucose, UA: >=500 mg/dL — AB
Hgb urine dipstick: NEGATIVE
Ketones, ur: NEGATIVE mg/dL
Nitrite: NEGATIVE
Protein, ur: 100 mg/dL — AB
Specific Gravity, Urine: 1.026 (ref 1.005–1.030)
pH: 5 (ref 5.0–8.0)

## 2018-08-25 LAB — COMPREHENSIVE METABOLIC PANEL
ALT: 32 U/L (ref 0–44)
AST: 34 U/L (ref 15–41)
Albumin: 3.2 g/dL — ABNORMAL LOW (ref 3.5–5.0)
Alkaline Phosphatase: 77 U/L (ref 38–126)
Anion gap: 11 (ref 5–15)
BUN: 18 mg/dL (ref 8–23)
CO2: 27 mmol/L (ref 22–32)
Calcium: 8.2 mg/dL — ABNORMAL LOW (ref 8.9–10.3)
Chloride: 101 mmol/L (ref 98–111)
Creatinine, Ser: 0.85 mg/dL (ref 0.44–1.00)
GFR calc Af Amer: >60 mL/min (ref >60)
GFR calc non Af Amer: >60 mL/min (ref >60)
Glucose, Bld: 145 mg/dL — ABNORMAL HIGH (ref 70–99)
Potassium: 3.7 mmol/L (ref 3.5–5.1)
Sodium: 139 mmol/L (ref 135–145)
Total Bilirubin: 1 mg/dL (ref 0.3–1.2)
Total Protein: 7.9 g/dL (ref 6.5–8.1)

## 2018-08-25 LAB — LIPASE, BLOOD: Lipase: 25 U/L (ref 11–51)

## 2018-08-25 LAB — C DIFFICILE QUICK SCREEN W PCR REFLEX
C Diff antigen: NEGATIVE
C Diff interpretation: NOT DETECTED
C Diff toxin: NEGATIVE

## 2018-08-25 LAB — BRAIN NATRIURETIC PEPTIDE: B Natriuretic Peptide: 130 pg/mL — ABNORMAL HIGH (ref 0.0–100.0)

## 2018-08-25 LAB — HEMOGLOBIN A1C
Hgb A1c MFr Bld: 8.4 % — ABNORMAL HIGH (ref 4.8–5.6)
Mean Plasma Glucose: 194.38 mg/dL

## 2018-08-25 LAB — SARS CORONAVIRUS 2 BY RT PCR (HOSPITAL ORDER, PERFORMED IN ~~LOC~~ HOSPITAL LAB): SARS Coronavirus 2: NEGATIVE

## 2018-08-25 LAB — CBG MONITORING, ED: Glucose-Capillary: 117 mg/dL — ABNORMAL HIGH (ref 70–99)

## 2018-08-25 LAB — TROPONIN I (HIGH SENSITIVITY): Troponin I (High Sensitivity): 5 ng/L (ref <18)

## 2018-08-25 MED ORDER — ENOXAPARIN SODIUM 40 MG/0.4ML ~~LOC~~ SOLN: 40.0000 mg | SUBCUTANEOUS | Status: DC

## 2018-08-25 MED ORDER — MUSCLE RUB 10-15 % EX CREA
1.0000 "application " | TOPICAL_CREAM | CUTANEOUS | Status: DC | PRN
  Filled 2018-08-25: qty 85

## 2018-08-25 MED ORDER — HYDROCORTISONE (PERIANAL) 2.5 % EX CREA
1.0000 "application " | TOPICAL_CREAM | Freq: Four times a day (QID) | CUTANEOUS | Status: DC | PRN
  Filled 2018-08-25: qty 28.35

## 2018-08-25 MED ORDER — ACETAMINOPHEN 325 MG PO TABS: 650.0000 mg | ORAL_TABLET | Freq: Four times a day (QID) | ORAL | Status: DC | PRN

## 2018-08-25 MED ORDER — HYDROCORTISONE 1 % EX CREA
1.0000 "application " | TOPICAL_CREAM | Freq: Three times a day (TID) | CUTANEOUS | Status: DC | PRN
  Filled 2018-08-25: qty 28

## 2018-08-25 MED ORDER — ALUM & MAG HYDROXIDE-SIMETH 200-200-20 MG/5ML PO SUSP: 30.0000 mL | ORAL | Status: DC | PRN

## 2018-08-25 MED ORDER — FUROSEMIDE 40 MG PO TABS: 20.0000 mg | ORAL_TABLET | Freq: Every day | ORAL | Status: DC

## 2018-08-25 MED ORDER — IOHEXOL 300 MG/ML  SOLN
100.0000 mL | Freq: Once | INTRAMUSCULAR | Status: AC | PRN
  Administered 2018-08-25: 100 mL via INTRAVENOUS

## 2018-08-25 MED ORDER — ATORVASTATIN CALCIUM 10 MG PO TABS: 10.0000 mg | ORAL_TABLET | ORAL | Status: DC

## 2018-08-25 MED ORDER — PROMETHAZINE HCL 25 MG/ML IJ SOLN
12.5000 mg | Freq: Once | INTRAMUSCULAR | Status: AC
  Administered 2018-08-25: 12.5 mg via INTRAVENOUS
  Filled 2018-08-25: qty 1

## 2018-08-25 MED ORDER — VANCOMYCIN HCL IN DEXTROSE 1-5 GM/200ML-% IV SOLN: 1000.0000 mg | Freq: Once | INTRAVENOUS | Status: DC

## 2018-08-25 MED ORDER — IPRATROPIUM-ALBUTEROL 0.5-2.5 (3) MG/3ML IN SOLN: 3.0000 mL | RESPIRATORY_TRACT | Status: DC | PRN

## 2018-08-25 MED ORDER — PHENOL 1.4 % MT LIQD
1.0000 | OROMUCOSAL | Status: DC | PRN
  Filled 2018-08-25: qty 177

## 2018-08-25 MED ORDER — HYDRALAZINE HCL 20 MG/ML IJ SOLN: 10.0000 mg | INTRAMUSCULAR | Status: DC | PRN

## 2018-08-25 MED ORDER — HYDROCORTISONE (PERIANAL) 2.5 % EX CREA: 1.0000 "application " | TOPICAL_CREAM | Freq: Four times a day (QID) | CUTANEOUS | 0 refills | Status: DC | PRN

## 2018-08-25 MED ORDER — INSULIN DETEMIR 100 UNIT/ML ~~LOC~~ SOLN
50.0000 [IU] | Freq: Two times a day (BID) | SUBCUTANEOUS | Status: DC
  Filled 2018-08-25 (×6): qty 0.5

## 2018-08-25 MED ORDER — MECLIZINE HCL 12.5 MG PO TABS: 25.0000 mg | ORAL_TABLET | Freq: Three times a day (TID) | ORAL | Status: DC | PRN

## 2018-08-25 MED ORDER — CLINDAMYCIN HCL 300 MG PO CAPS: 600.0000 mg | ORAL_CAPSULE | Freq: Three times a day (TID) | ORAL | 0 refills | Status: DC

## 2018-08-25 MED ORDER — SALINE SPRAY 0.65 % NA SOLN
1.0000 | NASAL | Status: DC | PRN
  Filled 2018-08-25: qty 44

## 2018-08-25 MED ORDER — INSULIN ASPART 100 UNIT/ML ~~LOC~~ SOLN: 20.0000 [IU] | Freq: Three times a day (TID) | SUBCUTANEOUS | Status: DC

## 2018-08-25 MED ORDER — LIP MEDEX EX OINT
1.0000 "application " | TOPICAL_OINTMENT | CUTANEOUS | Status: DC | PRN
  Filled 2018-08-25: qty 7

## 2018-08-25 MED ORDER — INSULIN ASPART 100 UNIT/ML ~~LOC~~ SOLN: 0.0000 [IU] | Freq: Every day | SUBCUTANEOUS | Status: DC

## 2018-08-25 MED ORDER — POLYVINYL ALCOHOL 1.4 % OP SOLN
1.0000 [drp] | OPHTHALMIC | Status: DC | PRN
  Filled 2018-08-25: qty 15

## 2018-08-25 MED ORDER — ALBUTEROL SULFATE HFA 108 (90 BASE) MCG/ACT IN AERS: 2.0000 | INHALATION_SPRAY | RESPIRATORY_TRACT | Status: DC | PRN

## 2018-08-25 MED ORDER — VANCOMYCIN HCL IN DEXTROSE 1-5 GM/200ML-% IV SOLN
1000.0000 mg | Freq: Once | INTRAVENOUS | Status: AC
  Administered 2018-08-25: 1000 mg via INTRAVENOUS
  Filled 2018-08-25: qty 200

## 2018-08-25 MED ORDER — PIPERACILLIN-TAZOBACTAM 3.375 G IVPB: 3.3750 g | Freq: Three times a day (TID) | INTRAVENOUS | Status: DC

## 2018-08-25 MED ORDER — FUROSEMIDE 20 MG PO TABS: 40.0000 mg | ORAL_TABLET | Freq: Every day | ORAL | 0 refills | Status: DC

## 2018-08-25 MED ORDER — BUPROPION HCL ER (XL) 150 MG PO TB24
150.0000 mg | ORAL_TABLET | Freq: Every day | ORAL | Status: DC
  Filled 2018-08-25 (×3): qty 1

## 2018-08-25 MED ORDER — ALBUTEROL SULFATE (2.5 MG/3ML) 0.083% IN NEBU: 2.5000 mg | INHALATION_SOLUTION | RESPIRATORY_TRACT | Status: DC | PRN

## 2018-08-25 MED ORDER — SODIUM CHLORIDE 0.9% FLUSH: 3.0000 mL | Freq: Once | INTRAVENOUS | Status: DC

## 2018-08-25 MED ORDER — ACETAMINOPHEN 650 MG RE SUPP: 650.0000 mg | Freq: Four times a day (QID) | RECTAL | Status: DC | PRN

## 2018-08-25 MED ORDER — FLUCONAZOLE 150 MG PO TABS: 150.0000 mg | ORAL_TABLET | Freq: Every day | ORAL | Status: DC

## 2018-08-25 MED ORDER — HYDROCODONE-ACETAMINOPHEN 5-325 MG PO TABS: 1.0000 | ORAL_TABLET | ORAL | Status: DC | PRN

## 2018-08-25 MED ORDER — PIPERACILLIN-TAZOBACTAM 3.375 G IVPB 30 MIN
3.3750 g | Freq: Once | INTRAVENOUS | Status: AC
  Administered 2018-08-25: 3.375 g via INTRAVENOUS
  Filled 2018-08-25: qty 50

## 2018-08-25 MED ORDER — VITAMIN D (ERGOCALCIFEROL) 1.25 MG (50000 UNIT) PO CAPS: 50000.0000 [IU] | ORAL_CAPSULE | ORAL | Status: DC

## 2018-08-25 MED ORDER — INSULIN ASPART 100 UNIT/ML ~~LOC~~ SOLN: 0.0000 [IU] | Freq: Three times a day (TID) | SUBCUTANEOUS | Status: DC

## 2018-08-25 MED ORDER — VANCOMYCIN HCL IN DEXTROSE 750-5 MG/150ML-% IV SOLN: 750.0000 mg | Freq: Two times a day (BID) | INTRAVENOUS | Status: DC

## 2018-08-25 MED ORDER — ONDANSETRON HCL 4 MG/2ML IJ SOLN
4.0000 mg | Freq: Once | INTRAMUSCULAR | Status: AC
  Administered 2018-08-25: 4 mg via INTRAVENOUS
  Filled 2018-08-25: qty 2

## 2018-08-25 MED ORDER — ASPIRIN EC 81 MG PO TBEC: 81.0000 mg | DELAYED_RELEASE_TABLET | Freq: Every day | ORAL | Status: DC

## 2018-08-25 MED ORDER — LISINOPRIL 10 MG PO TABS: 10.0000 mg | ORAL_TABLET | Freq: Every day | ORAL | Status: DC

## 2018-08-25 MED ORDER — METOPROLOL TARTRATE 25 MG PO TABS: 12.5000 mg | ORAL_TABLET | Freq: Two times a day (BID) | ORAL | Status: DC

## 2018-08-25 MED ORDER — FUROSEMIDE 10 MG/ML IJ SOLN
40.0000 mg | Freq: Two times a day (BID) | INTRAMUSCULAR | Status: DC
  Administered 2018-08-25: 40 mg via INTRAVENOUS
  Filled 2018-08-25: qty 4

## 2018-08-25 MED ORDER — LORATADINE 10 MG PO TABS: 10.0000 mg | ORAL_TABLET | Freq: Every day | ORAL | Status: DC | PRN

## 2018-08-25 NOTE — ED Provider Notes (Signed)
[facility] EMERGENCY DEPARTMENT Provider Note   CSN: [id number] Arrival date & time: 08/25/18  1044    History   Chief Complaint Chief Complaint  Patient presents with   Abdominal Pain    HPI [name] is a 63 y.o. female with a past medical history as outlined below, most significant for CAD including STEMI 2016, carotid artery disease, COPD, hypertension, hyperlipidemia, morbid obesity, sleep apnea and type 2 diabetes presenting with a 3-day history of abdominal pain along with nausea without emesis and nonbloody watery diarrhea.  She reports increasing abdominal distention and increasing pain.  Her pain started in her periumbilical region and has basically extended now including the entire abdomen.  Her last bowel movement was around 6 AM this morning, passing brown liquid stool.  She reports nausea without emesis.  She has had no fevers or chills, she denies any recent travel, no family members with similar symptoms, she does endorse completing a 7 day course of Keflex one week ago for cellulitis of her leg.  She has had no medications prior to arrival for her symptoms.  She also notes shortness of breath which she correlates with her COPD.  She was found to be hypoxic upon first arrival at 87%, which improved to 96% on 2 L nasal cannula.  She took a nebulizer treatment around 6 AM this morning with transient improvement.  She denies chest pain.  She does endorse taking it deep breath makes her abdominal pain worse, believes her distention may be causing her shortness of breath.  She denies coughing or fever.      The history is provided by the patient.    Past Medical History:  Diagnosis Date   Anxiety    Aortic stenosis    a. Mild by 2D ECHO 11/17/14   CAD (coronary artery disease)    a. 10/2014: Ant STEMI s/p DES to dLAD   Carotid disease, bilateral ([place])    a. Duplex 12/2014: mild-mod atherosclerotic plaque without hemodynamically significant stenosis.   COPD  (chronic obstructive pulmonary disease) (HCC)    Essential hypertension    HLD (hyperlipidemia)    Morbid obesity (HCC)    Myocardial infarction ([place]) 11/17/2014   PONV (postoperative nausea and vomiting)    Shingles    Sleep apnea    uses CPAP   Type 2 diabetes mellitus [facility])     Patient Active Problem List   Diagnosis Date Noted   Morbid obesity ([place]) 01/14/2015   CAD (coronary artery disease)    COPD (chronic obstructive pulmonary disease) ([place])    Hypertension    DM type 2 causing vascular disease ([place])    Current smoker    HLD (hyperlipidemia)    Aortic stenosis    ST elevation (STEMI) myocardial infarction involving left anterior descending coronary artery ([place]) 11/17/2014    Past Surgical History:  Procedure Laterality Date   ABDOMINAL HYSTERECTOMY     BREAST BIOPSY Left    2016   CARDIAC CATHETERIZATION N/A 11/17/2014   Procedure: Left Heart Cath and Coronary Angiography;  Surgeon: Wellington Hampshire, MD;  Location: [place] [facility];  Service: Cardiovascular;  Laterality: N/A;   CARDIAC CATHETERIZATION N/A 11/17/2014   Procedure: Coronary Stent Intervention;  Surgeon: Wellington Hampshire, MD;  Location: [place] [facility];  Service: Cardiovascular;  Laterality: N/A;  distal lad 2.25x20 promus   CATARACT EXTRACTION W/PHACO Left 09/20/2016   Procedure: CATARACT EXTRACTION PHACO AND INTRAOCULAR LENS PLACEMENT (IOC);  Surgeon: Tonny Branch, MD;  Location: [facility];  Service: Ophthalmology;  Laterality: Left;  CDE: 9.27   CATARACT EXTRACTION W/PHACO Right 10/18/2016   Procedure: CATARACT EXTRACTION PHACO AND INTRAOCULAR LENS PLACEMENT (IOC);  Surgeon: Tonny Branch, MD;  Location: [facility];  Service: Ophthalmology;  Laterality: Right;  CDE: 6.38   CHOLECYSTECTOMY     SPINAL FUSION     cervical; screws and plates.     OB History    Gravida  1   Para  1   Term  1   Preterm      AB      Living  1     SAB      TAB      Ectopic      Multiple        Live Births               Home Medications    Prior to Admission medications   Medication Sig Start Date End Date Taking? Authorizing Provider  acetaminophen (TYLENOL) 500 MG tablet Take 500 mg by mouth daily as needed for moderate pain or headache.   Yes [provider]  albuterol (PROVENTIL HFA;VENTOLIN HFA) 108 (90 Base) MCG/ACT inhaler Inhale 2 puffs into the lungs every 4 (four) hours as needed for wheezing or shortness of breath. 05/17/15  Yes Francine Graven, DO  albuterol (PROVENTIL) (2.5 MG/3ML) 0.083% nebulizer solution Take 3 mLs (2.5 mg total) by nebulization every 4 (four) hours as needed for wheezing or shortness of breath. 05/17/15  Yes Francine Graven, DO  aspirin EC 81 MG tablet Take 81 mg by mouth daily.   Yes [provider]  buPROPion (WELLBUTRIN XL) 150 MG 24 hr tablet Take 1 tablet by mouth at bedtime. 06/27/18  Yes [provider]  FARXIGA 10 MG TABS tablet Take 10 mg by mouth daily. 07/01/18  Yes [provider]  fluconazole (DIFLUCAN) 100 MG tablet Take 100 mg by mouth daily. 07/24/18  Yes [provider]  furosemide (LASIX) 20 MG tablet TAKE 1 TABLET BY MOUTH EVERY DAY 06/26/18  Yes Satira Sark, MD  insulin aspart (NOVOLOG) 100 UNIT/ML injection Inject 35 Units into the skin 3 (three) times daily between meals.   Yes [provider]  LEVEMIR FLEXTOUCH 100 UNIT/ML Pen Inject 80 Units into the skin 2 (two) times daily. 03/29/18  Yes [provider]  lisinopril (ZESTRIL) 10 MG tablet TAKE 1 TABLET BY MOUTH EVERY DAY 06/26/18  Yes Satira Sark, MD  meclizine (ANTIVERT) 25 MG tablet Take 1 tablet (25 mg total) by mouth 3 (three) times daily as needed for dizziness. 08/12/17  Yes Julianne Rice, MD  metFORMIN (GLUCOPHAGE) 1000 MG tablet Take 1,000 mg by mouth 2 (two) times daily.  09/26/14  Yes [provider]  metoprolol tartrate (LOPRESSOR) 25 MG tablet TAKE 1/2 TABLET BY MOUTH TWICE A DAY  03/16/18  Yes Satira Sark, MD  Vitamin D, Ergocalciferol, (DRISDOL) 50000 units CAPS capsule Take 50,000 Units by mouth every Saturday. 08/30/16  Yes [provider]  atorvastatin (LIPITOR) 10 MG tablet TAKE 1 TABLET (10 MG TOTAL) BY MOUTH EVERY OTHER DAY. TAKE AT [facility] 10/05/17 01/03/18  Satira Sark, MD  benzonatate (TESSALON) 100 MG capsule Take 2 capsules (200 mg total) by mouth 3 (three) times daily as needed. Patient not taking: Reported on 08/25/2018 04/04/18   Evalee Jefferson, PA-C  doxycycline (VIBRAMYCIN) 100 MG capsule Take 1 capsule (100 mg total) by mouth 2 (two) times daily. One po  bid x 7 days Patient not taking: Reported on 08/25/2018 07/19/18   Elnora Morrison, MD  predniSONE (DELTASONE) 20 MG tablet 3 tabs po day one, then 2 tabs daily x 4 days Patient not taking: Reported on 08/25/2018 07/19/18   Elnora Morrison, MD  traMADol (ULTRAM) 50 MG tablet Take 1 tablet (50 mg total) by mouth every 6 (six) hours as needed. Patient not taking: Reported on 08/25/2018 04/06/17   Kem Parkinson, PA-C    Family History Family History  Problem Relation Age of Onset   Heart attack Father    Heart disease Father    Cancer Brother     Social History Social History   Tobacco Use   Smoking status: Current Some Day Smoker    Packs/day: 0.25    Years: 30.00    Pack years: 7.50    Types: Cigarettes   Smokeless tobacco: Never Used   Tobacco comment: 3 cigs per day  Substance Use Topics   Alcohol use: No    Alcohol/week: 0.0 standard drinks   Drug use: No     Allergies   Codeine and Sulfur   Review of Systems Review of Systems  Constitutional: Negative for chills and fever.  HENT: Negative for congestion and sore throat.   Eyes: Negative.   Respiratory: Positive for shortness of breath. Negative for cough and chest tightness.   Cardiovascular: Negative for chest pain.  Gastrointestinal: Positive for abdominal distention, abdominal pain, diarrhea and nausea.  Negative for vomiting.  Genitourinary: Negative.   Musculoskeletal: Negative for arthralgias, joint swelling and neck pain.  Skin: Negative.  Negative for rash and wound.  Neurological: Negative for dizziness, weakness, light-headedness, numbness and headaches.  Psychiatric/Behavioral: Negative.      Physical Exam Updated Vital Signs BP 139/70    Pulse 73    Temp 98 F (36.7 C) (Oral)    Resp (!) 22    SpO2 95%   Physical Exam Vitals signs and nursing note reviewed.  Constitutional:      Appearance: She is well-developed.  HENT:     Head: Normocephalic and atraumatic.  Eyes:     Conjunctiva/sclera: Conjunctivae normal.  Neck:     Musculoskeletal: Normal range of motion.  Cardiovascular:     Rate and Rhythm: Normal rate and regular rhythm.     Heart sounds: Normal heart sounds.  Pulmonary:     Effort: Pulmonary effort is normal. No respiratory distress.     Breath sounds: Decreased air movement present. No stridor. Decreased breath sounds present. No wheezing or rhonchi.     Comments: Poor effort Abdominal:     General: Abdomen is protuberant. A surgical scar is present. Bowel sounds are decreased. There is distension.     Palpations: Abdomen is soft.     Tenderness: There is generalized abdominal tenderness. There is no guarding or rebound.  Musculoskeletal: Normal range of motion.  Skin:    General: Skin is warm and dry.     Comments: Obese abdomen, distended,  stretch marks, surgical scare present.   Neurological:     Mental Status: She is alert.      ED Treatments / Results  Labs (all labs ordered are listed, but only abnormal results are displayed) Results for orders placed or performed during the hospital encounter of 08/25/18  Lipase, blood  Result Value Ref Range   Lipase 25 11 - 51 U/L  Comprehensive metabolic panel  Result Value Ref Range   Sodium 139 135 - 145 mmol/L  Potassium 3.7 3.5 - 5.1 mmol/L   Chloride 101 98 - 111 mmol/L   CO2 27 22 - 32  mmol/L   Glucose, Bld 145 (H) 70 - 99 mg/dL   BUN 18 8 - 23 mg/dL   Creatinine, Ser 0.85 0.44 - 1.00 mg/dL   Calcium 8.2 (L) 8.9 - 10.3 mg/dL   Total Protein 7.9 6.5 - 8.1 g/dL   Albumin 3.2 (L) 3.5 - 5.0 g/dL   AST 34 15 - 41 U/L   ALT 32 0 - 44 U/L   Alkaline Phosphatase 77 38 - 126 U/L   Total Bilirubin 1.0 0.3 - 1.2 mg/dL   GFR calc non Af Amer >60 >60 mL/min   GFR calc Af Amer >60 >60 mL/min   Anion gap 11 5 - 15  Urinalysis, Routine w reflex microscopic  Result Value Ref Range   Color, Urine YELLOW YELLOW   APPearance HAZY (A) CLEAR   Specific Gravity, Urine 1.026 1.005 - 1.030   pH 5.0 5.0 - 8.0   Glucose, UA >=500 (A) NEGATIVE mg/dL   Hgb urine dipstick NEGATIVE NEGATIVE   Bilirubin Urine NEGATIVE NEGATIVE   Ketones, ur NEGATIVE NEGATIVE mg/dL   Protein, ur 100 (A) NEGATIVE mg/dL   Nitrite NEGATIVE NEGATIVE   Leukocytes,Ua MODERATE (A) NEGATIVE   RBC / HPF 11-20 0 - 5 RBC/hpf   WBC, UA 6-10 0 - 5 WBC/hpf   Bacteria, UA RARE (A) NONE SEEN   Squamous Epithelial / LPF 6-10 0 - 5   Mucus PRESENT    Budding Yeast PRESENT   CBC with Differential  Result Value Ref Range   WBC 5.0 4.0 - 10.5 K/uL   RBC 4.70 3.87 - 5.11 MIL/uL   Hemoglobin 13.7 12.0 - 15.0 g/dL   HCT 45.1 36.0 - 46.0 %   MCV 96.0 80.0 - 100.0 fL   MCH 29.1 26.0 - 34.0 pg   MCHC 30.4 30.0 - 36.0 g/dL   RDW 16.1 (H) 11.5 - 15.5 %   Platelets 252 150 - 400 K/uL   nRBC 0.0 0.0 - 0.2 %   Neutrophils Relative % 63 %   Neutro Abs 3.2 1.7 - 7.7 K/uL   Lymphocytes Relative 23 %   Lymphs Abs 1.1 0.7 - 4.0 K/uL   Monocytes Relative 11 %   Monocytes Absolute 0.6 0.1 - 1.0 K/uL   Eosinophils Relative 2 %   Eosinophils Absolute 0.1 0.0 - 0.5 K/uL   Basophils Relative 1 %   Basophils Absolute 0.0 0.0 - 0.1 K/uL   Immature Granulocytes 0 %   Abs Immature Granulocytes 0.02 0.00 - 0.07 K/uL  Brain natriuretic peptide  Result Value Ref Range   B Natriuretic Peptide 130.0 (H) 0.0 - 100.0 pg/mL  Troponin I (High  Sensitivity)  Result Value Ref Range   Troponin I (High Sensitivity) 5 <18 ng/L   Ct Abdomen Pelvis W Contrast  Result Date: 08/25/2018 CLINICAL DATA:  Abdominal pain EXAM: CT ABDOMEN AND PELVIS WITH CONTRAST TECHNIQUE: Multidetector CT imaging of the abdomen and pelvis was performed using the standard protocol following bolus administration of intravenous contrast. CONTRAST:  174mL OMNIPAQUE IOHEXOL 300 MG/ML  SOLN COMPARISON:  Jun 11, 2014 FINDINGS: Lower chest: There is a moderate free-flowing pleural effusion on the right. Lung bases otherwise appear clear. Hepatobiliary: Liver measures 21.0 cm in length. There is diffuse hepatic steatosis. No focal liver lesions are evident. Gallbladder is absent. There is no appreciable biliary duct dilatation. Pancreas: There  is no pancreatic mass or inflammatory focus. Spleen: No splenic lesions are evident. Adrenals/Urinary Tract: Right adrenal appears normal. Left adrenal hypertrophy appear stable compared to the 2016 study. There is no appreciable renal mass or hydronephrosis on either side. There is no evident renal or ureteral calculus on either side. Urinary bladder is essentially decompressed at this time. There is no urinary bladder wall thickening demonstrable. Stomach/Bowel: There is no appreciable bowel wall or mesenteric thickening. There is no evident bowel obstruction. Terminal ileum appears normal. There is mild lipomatous infiltration of the ileocecal valve. There is no free air or portal venous air. Vascular/Lymphatic: There are foci of aortic and iliac artery atherosclerosis. No aneurysm evident. No adenopathy is appreciable in the abdomen or pelvis. Reproductive: Uterus absent.  No pelvic mass evident. Other: Appendix unremarkable without periappendiceal region inflammation. There is no evident abscess in the abdomen or pelvis. There is fairly mild ascites in the dependent portion of the pelvis. There is slight soft tissue thickening in the  mesentery. There is more extensive and diffuse soft tissue stranding throughout the abdominal wall consistent with a degree of anasarca. There is diffuse thickening along the anterior abdominal wall on both right and left sides, slightly more on the right than on the left, which may represent a degree of cellulitis. No fluid collection or abscess noted in the abdominal wall regions. Musculoskeletal: There is degenerative change in the lumbar spine. There are no blastic or lytic bone lesions. No intramuscular lesions are evident. IMPRESSION: 1. Suspect anasarca with edema in the abdominal and pelvic wall regions as well as a much lesser degree of soft tissue thickening in the abdominal mesentery. Question cellulitis along the right and left anterior abdominal and pelvic walls. No abdominal wall fluid collections. 2. Incomplete visualization of moderate free-flowing right pleural effusion. There is fairly mild ascites in the pelvis. 3. No demonstrable bowel obstruction. No abscess in the abdomen or pelvis. Appendiceal region appears normal. 4. No evident renal or ureteral calculus. No hydronephrosis on either side. 5. Hepatic steatosis. Liver is prominent, measuring 21.0 cm in length. No focal liver lesions evident. 6.  Gallbladder absent.  Uterus absent. 7.  Aortic Atherosclerosis (ICD10-I70.0). Electronically Signed   By: Lowella Grip III M.D.   On: 08/25/2018 14:13   Dg Abd Acute 2+v W 1v Chest  Result Date: 08/25/2018 CLINICAL DATA:  Abdominal pain, short of breath EXAM: DG ABDOMEN ACUTE W/ 1V CHEST COMPARISON:  None. FINDINGS: Anterior cervical fusion noted. Normal cardiac silhouette. There is central venous congestion. Small RIGHT effusion. No focal consolidation. No dilated large or small bowel. Cholecystectomy clips noted. Gas in the rectum. No organomegaly. No pathologic calcifications. IMPRESSION: 1. Central venous congestion and RIGHT pleural effusion suggest volume overload. 2. No bowel  obstruction. Electronically Signed   By: Suzy Bouchard M.D.   On: 08/25/2018 12:46     EKG EKG Interpretation  Date/Time:  Friday August 25 2018 12:45:43 EDT Ventricular Rate:  70 PR Interval:    QRS Duration: 47 QT Interval:  608 QTC Calculation: 657 R Axis:   109 Text Interpretation:  Sinus rhythm Atrial premature complex Right axis deviation Low voltage, precordial leads Abnormal lateral Q waves Probable anteroseptal infarct, old Prolonged QT interval Confirmed by Gerlene Fee [phone]) on 08/25/2018 1:09:10 PM   Radiology Ct Abdomen Pelvis W Contrast  Result Date: 08/25/2018 CLINICAL DATA:  Abdominal pain EXAM: CT ABDOMEN AND PELVIS WITH CONTRAST TECHNIQUE: Multidetector CT imaging of the abdomen and pelvis was performed using  the standard protocol following bolus administration of intravenous contrast. CONTRAST:  166mL OMNIPAQUE IOHEXOL 300 MG/ML  SOLN COMPARISON:  Jun 11, 2014 FINDINGS: Lower chest: There is a moderate free-flowing pleural effusion on the right. Lung bases otherwise appear clear. Hepatobiliary: Liver measures 21.0 cm in length. There is diffuse hepatic steatosis. No focal liver lesions are evident. Gallbladder is absent. There is no appreciable biliary duct dilatation. Pancreas: There is no pancreatic mass or inflammatory focus. Spleen: No splenic lesions are evident. Adrenals/Urinary Tract: Right adrenal appears normal. Left adrenal hypertrophy appear stable compared to the 2016 study. There is no appreciable renal mass or hydronephrosis on either side. There is no evident renal or ureteral calculus on either side. Urinary bladder is essentially decompressed at this time. There is no urinary bladder wall thickening demonstrable. Stomach/Bowel: There is no appreciable bowel wall or mesenteric thickening. There is no evident bowel obstruction. Terminal ileum appears normal. There is mild lipomatous infiltration of the ileocecal valve. There is no free air or portal venous  air. Vascular/Lymphatic: There are foci of aortic and iliac artery atherosclerosis. No aneurysm evident. No adenopathy is appreciable in the abdomen or pelvis. Reproductive: Uterus absent.  No pelvic mass evident. Other: Appendix unremarkable without periappendiceal region inflammation. There is no evident abscess in the abdomen or pelvis. There is fairly mild ascites in the dependent portion of the pelvis. There is slight soft tissue thickening in the mesentery. There is more extensive and diffuse soft tissue stranding throughout the abdominal wall consistent with a degree of anasarca. There is diffuse thickening along the anterior abdominal wall on both right and left sides, slightly more on the right than on the left, which may represent a degree of cellulitis. No fluid collection or abscess noted in the abdominal wall regions. Musculoskeletal: There is degenerative change in the lumbar spine. There are no blastic or lytic bone lesions. No intramuscular lesions are evident. IMPRESSION: 1. Suspect anasarca with edema in the abdominal and pelvic wall regions as well as a much lesser degree of soft tissue thickening in the abdominal mesentery. Question cellulitis along the right and left anterior abdominal and pelvic walls. No abdominal wall fluid collections. 2. Incomplete visualization of moderate free-flowing right pleural effusion. There is fairly mild ascites in the pelvis. 3. No demonstrable bowel obstruction. No abscess in the abdomen or pelvis. Appendiceal region appears normal. 4. No evident renal or ureteral calculus. No hydronephrosis on either side. 5. Hepatic steatosis. Liver is prominent, measuring 21.0 cm in length. No focal liver lesions evident. 6.  Gallbladder absent.  Uterus absent. 7.  Aortic Atherosclerosis (ICD10-I70.0). Electronically Signed   By: Lowella Grip III M.D.   On: 08/25/2018 14:13   Dg Abd Acute 2+v W 1v Chest  Result Date: 08/25/2018 CLINICAL DATA:  Abdominal pain, short  of breath EXAM: DG ABDOMEN ACUTE W/ 1V CHEST COMPARISON:  None. FINDINGS: Anterior cervical fusion noted. Normal cardiac silhouette. There is central venous congestion. Small RIGHT effusion. No focal consolidation. No dilated large or small bowel. Cholecystectomy clips noted. Gas in the rectum. No organomegaly. No pathologic calcifications. IMPRESSION: 1. Central venous congestion and RIGHT pleural effusion suggest volume overload. 2. No bowel obstruction. Electronically Signed   By: Suzy Bouchard M.D.   On: 08/25/2018 12:46    Procedures Procedures (including critical care time)  Medications Ordered in ED Medications  sodium chloride flush (NS) 0.9 % injection 3 mL (3 mLs Intravenous Not Given 08/25/18 1121)  vancomycin (VANCOCIN) IVPB 1000 mg/200 mL premix (  1,000 mg Intravenous New Bag/Given 08/25/18 1518)  ondansetron (ZOFRAN) injection 4 mg (4 mg Intravenous Given 08/25/18 1223)  iohexol (OMNIPAQUE) 300 MG/ML solution 100 mL (100 mLs Intravenous Contrast Given 08/25/18 1342)  promethazine (PHENERGAN) injection 12.5 mg (12.5 mg Intravenous Given 08/25/18 1434)     Initial Impression / Assessment and Plan / ED Course  I have reviewed the triage vital signs and the nursing notes.  Pertinent labs & imaging results that were available during my care of the patient were reviewed by me and considered in my medical decision making (see chart for details).        Pt with abdominal wall cellulitis/? Anasarca with right lung pleural effusion.  She was started on vancomycin here. Discussed with Dr. Reesa Chew who agrees with admission.  Final Clinical Impressions(s) / ED Diagnoses   Final diagnoses:  Generalized abdominal pain  Cellulitis of abdominal wall  Pleural effusion    ED Discharge Orders    None       Landis Martins 08/25/18 1601    Maudie Flakes, MD 08/31/18 [phone]

## 2018-08-25 NOTE — Progress Notes (Signed)
Pharmacy Antibiotic Note  [name] is a 63 y.o. female admitted on 08/25/2018 with abdominal wall cellulitis. Pharmacy has been consulted for vancomycin and Zosyn dosing.  Plan:  Zosyn 3.375g IV over 30 min x1, then Zosyn 3.375g IV q8h (4-hr infusion) Loading dose:  vancomycin 2g (vancomycin 1g x2 doses) Maintenance dose:  vancomycin 750mg  IV q12h Goal vancomycin trough range: 10-15   mcg/mL Pharmacy will continue to monitor renal function, vancomycin troughs as clinically indicated, cultures and patient progress.    Height: 5' 4.02" (162.6 cm) Weight: 270 lb 15.1 oz (122.9 kg) IBW/kg (Calculated) : 54.74  Temp (24hrs), Avg:98 F (36.7 C), Min:98 F (36.7 C), Max:98 F (36.7 C)  Recent Labs  Lab 08/25/18 1116 08/25/18 1135  WBC 5.0  --   CREATININE  --  0.85    Estimated Creatinine Clearance: 88.8 mL/min (by C-G formula based on SCr of 0.85 mg/dL).    Allergies  Allergen Reactions  . Codeine Nausea And Vomiting  . Sulfur Rash    Antimicrobials this admission: vancomycin 7/31 >>   Zosyn 7/31 >>    Microbiology results: 7/31 SARS CoV-2: pending 7/31 UCx:  sent  7/31 GI PCR:    Thank you for allowing pharmacy to be a part of this patient's care.  Despina Pole 08/25/2018 4:46 PM

## 2018-08-25 NOTE — ED Notes (Signed)
Went into patients room to take her upstairs to her admission bed, pt states she doesn't want to be admitted. Explained to patient the reason for admission but she still refuses to be admitted at this time. Admitting MD notified and coming to speak to patient.

## 2018-08-25 NOTE — ED Notes (Signed)
Patient transported to CT 

## 2018-08-25 NOTE — ED Notes (Signed)
Pt ambulatory to waiting room at this time with no distress noted.

## 2018-08-25 NOTE — Discharge Summary (Signed)
Physician Discharge Summary  [name] MHD:[id number] DOB: 05-05-1955 DOA: 08/25/2018  PCP: Monico Blitz, MD  Admit date: 08/25/2018 Discharge date: 08/25/2018  Time spent: 35 minutes  Recommendations for Outpatient Follow-up:  1. Patient leaving against medical advice.Patient advised that leaving AMA puts her at risk of serious complications including severe debility and death. Patient reports that she understands, but she just can't stay. She has been advised to [place] ER if she should have worsening symptoms.    Discharge Diagnoses:  Principal Problem:   Abdominal wall cellulitis Active Problems:   CAD (coronary artery disease)   COPD (chronic obstructive pulmonary disease) (HCC)   Hypertension   DM type 2 causing vascular disease ([place])   Aortic stenosis   Morbid obesity ([place])   Anasarca   Hypoxia   Discharge Condition: Unstable for discharge. Leaving against medical advice.   Diet recommendation: Carb modified, heart healthy 1400kcal per day diet.   Filed Weights   08/25/18 1630  Weight: 122.9 kg    History of present illness:  [name] is a 63 y.o. female with medical history significant of coronary artery disease, diabetes mellitus type 2, mild aortic stenosis, COPD, essential hypertension, hyperlipidemia, morbid obesity with BMI greater than 35 came to the hospital with complaints of abdominal pain.  Patient states she has been having lower abdominal pain for the past 2-3 days has progressively getting worse.  During this time she is also noted to have some subjective fevers and chills and 3-4 episodes of loose diarrhea over the last 24 hours.  She denies vomiting but does feel nauseous at times. She denies any alcohol use, smokes half pack of cigarettes daily. Denies having similar symptoms in the past.  No exposure to COVID.  In the ER patient was noted to have distended abdomen, relatively normal labs.  CT of the abdomen pelvis showed anasarca,  mesenteric stranding consistent with cellulitis, hepatomegaly, hepatic steatosis and pleural effusion.  KUB with chest x-ray confirmed right-sided pleural effusion.  She was also noted to be slightly hypoxic on 87% on room air.  Medical team was requested to admit the patient.  When nurse went in patient room to take her up to the hospital floor, patient said she was not staying and wanted to go home. Nurse contacted myself. I spoke to patient, and she was tearful. She reported that her mother is 47yo and has a "broke back" and that she really needs to be home with her. I have advised that she can't be there for her mom to her full capacity until she is well, and the fastest route to her wellness is staying in the hospital for IV antibiotics. Patient then went on to say that she can't handle the stress of being here, and that her back is hurting from laying in the bed, so she is still going home. I have offered medicine to help with anxiety, and pain medicine. Patient refuses. She reports that she is going home no matter what. I discussed risks associated with leaving before adequate treatment - including but not limited to-  severe debility and death. Patient verbalizes understanding. Patient is alert and oriented x 3 and she is able to make her own decisions. Patient is leaving AMA.  Hospital Course:  CT abdomen and pelvis showed mesenteric stranding consistent with cellulitis. Patient was started on Vanc and Zosyn in the ER. Diflucan was also given for candida coverage. Patient was given IV lasix 40mg , and supplemental oxygen as  needed.  Patient left before being transferred to the floor.  Procedures:  RUQ [place] ordered, CT abdomen reported above.   Consultations:  Hospitalist for admission.   Discharge Exam: Vitals:   08/25/18 1930 08/25/18 2000  BP: (!) 153/73 (!) 129/114  Pulse: 76 (!) 46  Resp: 20 20  Temp:    SpO2: 94% (!) 89%    General: tearful laying supine in bed, re-positioning  frequently due to pain. Cardiovascular: HRRR, BP elevated, 2+edema in lower extremities.  Respiratory: Diminished breath sounds, no use of accessory muscles. 2L [place] in place.   Discharge Instructions Return to the ED for worsening symptoms.  Take all medications as prescribed and to completion.  Follow up with PCP as soon as appt is available.    Discharge Instructions    Discharge patient   Complete by: As directed    Patient leaving against medical advice.   Discharge disposition: 01-Home or Self Care   Discharge patient date: 08/25/2018     Allergies as of 08/25/2018      Reactions   Codeine Nausea And Vomiting   Sulfur Rash      Medication List    STOP taking these medications   benzonatate 100 MG capsule Commonly known as: TESSALON   doxycycline 100 MG capsule Commonly known as: VIBRAMYCIN   fluconazole 100 MG tablet Commonly known as: DIFLUCAN   predniSONE 20 MG tablet Commonly known as: DELTASONE     TAKE these medications   acetaminophen 500 MG tablet Commonly known as: TYLENOL Take 500 mg by mouth daily as needed for moderate pain or headache.   albuterol (2.5 MG/3ML) 0.083% nebulizer solution Commonly known as: PROVENTIL Take 3 mLs (2.5 mg total) by nebulization every 4 (four) hours as needed for wheezing or shortness of breath.   albuterol 108 (90 Base) MCG/ACT inhaler Commonly known as: VENTOLIN HFA Inhale 2 puffs into the lungs every 4 (four) hours as needed for wheezing or shortness of breath.   aspirin EC 81 MG tablet Take 81 mg by mouth daily.   atorvastatin 10 MG tablet Commonly known as: LIPITOR TAKE 1 TABLET (10 MG TOTAL) BY MOUTH EVERY OTHER DAY. TAKE AT DINNER   buPROPion 150 MG 24 hr tablet Commonly known as: WELLBUTRIN XL Take 1 tablet by mouth at bedtime.   clindamycin 300 MG capsule Commonly known as: CLEOCIN Take 2 capsules (600 mg total) by mouth 3 (three) times daily for 10 days.   Farxiga 10 MG Tabs tablet Generic drug:  dapagliflozin propanediol Take 10 mg by mouth daily.   furosemide 20 MG tablet Commonly known as: LASIX Take 2 tablets (40 mg total) by mouth daily for 5 days. What changed: how much to take   hydrocortisone 2.5 % rectal cream Commonly known as: ANUSOL-HC Apply 1 application topically 4 (four) times daily as needed for hemorrhoids.   Levemir FlexTouch 100 UNIT/ML Pen Generic drug: Insulin Detemir Inject 80 Units into the skin 2 (two) times daily.   lisinopril 10 MG tablet Commonly known as: ZESTRIL TAKE 1 TABLET BY MOUTH EVERY DAY   meclizine 25 MG tablet Commonly known as: ANTIVERT Take 1 tablet (25 mg total) by mouth 3 (three) times daily as needed for dizziness.   metFORMIN 1000 MG tablet Commonly known as: GLUCOPHAGE Take 1,000 mg by mouth 2 (two) times daily.   metoprolol tartrate 25 MG tablet Commonly known as: LOPRESSOR TAKE 1/2 TABLET BY MOUTH TWICE A DAY   NovoLOG 100 UNIT/ML injection Generic drug:  insulin aspart Inject 35 Units into the skin 3 (three) times daily between meals.   traMADol 50 MG tablet Commonly known as: ULTRAM Take 1 tablet (50 mg total) by mouth every 6 (six) hours as needed.   Vitamin D (Ergocalciferol) 1.25 MG (50000 UT) Caps capsule Commonly known as: DRISDOL Take 50,000 Units by mouth every Saturday.      Allergies  Allergen Reactions  . Codeine Nausea And Vomiting  . Sulfur Rash      The results of significant diagnostics from this hospitalization (including imaging, microbiology, ancillary and laboratory) are listed below for reference.    Significant Diagnostic Studies: Ct Abdomen Pelvis W Contrast  Result Date: 08/25/2018 CLINICAL DATA:  Abdominal pain EXAM: CT ABDOMEN AND PELVIS WITH CONTRAST TECHNIQUE: Multidetector CT imaging of the abdomen and pelvis was performed using the standard protocol following bolus administration of intravenous contrast. CONTRAST:  14mL OMNIPAQUE IOHEXOL 300 MG/ML  SOLN COMPARISON:  Jun 11, 2014 FINDINGS: Lower chest: There is a moderate free-flowing pleural effusion on the right. Lung bases otherwise appear clear. Hepatobiliary: Liver measures 21.0 cm in length. There is diffuse hepatic steatosis. No focal liver lesions are evident. Gallbladder is absent. There is no appreciable biliary duct dilatation. Pancreas: There is no pancreatic mass or inflammatory focus. Spleen: No splenic lesions are evident. Adrenals/Urinary Tract: Right adrenal appears normal. Left adrenal hypertrophy appear stable compared to the 2016 study. There is no appreciable renal mass or hydronephrosis on either side. There is no evident renal or ureteral calculus on either side. Urinary bladder is essentially decompressed at this time. There is no urinary bladder wall thickening demonstrable. Stomach/Bowel: There is no appreciable bowel wall or mesenteric thickening. There is no evident bowel obstruction. Terminal ileum appears normal. There is mild lipomatous infiltration of the ileocecal valve. There is no free air or portal venous air. Vascular/Lymphatic: There are foci of aortic and iliac artery atherosclerosis. No aneurysm evident. No adenopathy is appreciable in the abdomen or pelvis. Reproductive: Uterus absent.  No pelvic mass evident. Other: Appendix unremarkable without periappendiceal region inflammation. There is no evident abscess in the abdomen or pelvis. There is fairly mild ascites in the dependent portion of the pelvis. There is slight soft tissue thickening in the mesentery. There is more extensive and diffuse soft tissue stranding throughout the abdominal wall consistent with a degree of anasarca. There is diffuse thickening along the anterior abdominal wall on both right and left sides, slightly more on the right than on the left, which may represent a degree of cellulitis. No fluid collection or abscess noted in the abdominal wall regions. Musculoskeletal: There is degenerative change in the lumbar spine.  There are no blastic or lytic bone lesions. No intramuscular lesions are evident. IMPRESSION: 1. Suspect anasarca with edema in the abdominal and pelvic wall regions as well as a much lesser degree of soft tissue thickening in the abdominal mesentery. Question cellulitis along the right and left anterior abdominal and pelvic walls. No abdominal wall fluid collections. 2. Incomplete visualization of moderate free-flowing right pleural effusion. There is fairly mild ascites in the pelvis. 3. No demonstrable bowel obstruction. No abscess in the abdomen or pelvis. Appendiceal region appears normal. 4. No evident renal or ureteral calculus. No hydronephrosis on either side. 5. Hepatic steatosis. Liver is prominent, measuring 21.0 cm in length. No focal liver lesions evident. 6.  Gallbladder absent.  Uterus absent. 7.  Aortic Atherosclerosis (ICD10-I70.0). Electronically Signed   By: Lowella Grip III M.D.  On: 08/25/2018 14:13   Dg Abd Acute 2+v W 1v Chest  Result Date: 08/25/2018 CLINICAL DATA:  Abdominal pain, short of breath EXAM: DG ABDOMEN ACUTE W/ 1V CHEST COMPARISON:  None. FINDINGS: Anterior cervical fusion noted. Normal cardiac silhouette. There is central venous congestion. Small RIGHT effusion. No focal consolidation. No dilated large or small bowel. Cholecystectomy clips noted. Gas in the rectum. No organomegaly. No pathologic calcifications. IMPRESSION: 1. Central venous congestion and RIGHT pleural effusion suggest volume overload. 2. No bowel obstruction. Electronically Signed   By: Suzy Bouchard M.D.   On: 08/25/2018 12:46   US Abdomen Limited Ruq  Result Date: 08/25/2018 CLINICAL DATA:  Hepatic steatosis EXAM: ULTRASOUND ABDOMEN LIMITED RIGHT UPPER QUADRANT COMPARISON:  CT abdomen and pelvis August 25, 2018. FINDINGS: Gallbladder: Surgically absent. Common bile duct: Diameter: 5 mm. No intrahepatic or extrahepatic biliary duct dilatation. Liver: No focal lesion identified. Liver  echogenicity is increased diffusely. Portal vein is patent on color Doppler imaging with normal direction of blood flow towards the liver. Other: Right pleural effusion noted. No ascites is demonstrated on this study. IMPRESSION: 1. Marked increase in liver echogenicity, a finding indicative of hepatic steatosis. While no focal liver lesions are evident on this study, it must be cautioned that the sensitivity of ultrasound for detection of focal liver lesions is diminished in this circumstance. 2.  Gallbladder absent. 3.  Right pleural effusion. 4.  No ascites demonstrable by ultrasound. Electronically Signed   By: Lowella Grip III M.D.   On: 08/25/2018 16:50    Microbiology: Recent Results (from the past 240 hour(s))  SARS Coronavirus 2 [facility] order, Performed in [facility] hospital lab) Nasopharyngeal Nasopharyngeal Swab     Status: None   Collection Time: 08/25/18  3:39 PM   Specimen: Nasopharyngeal Swab  Result Value Ref Range Status   SARS Coronavirus 2 NEGATIVE NEGATIVE Final    Comment: (NOTE) If result is NEGATIVE SARS-CoV-2 target nucleic acids are NOT DETECTED. The SARS-CoV-2 RNA is generally detectable in upper and lower  respiratory specimens during the acute phase of infection. The lowest  concentration of SARS-CoV-2 viral copies this assay can detect is 250  copies / mL. A negative result does not preclude SARS-CoV-2 infection  and should not be used as the sole basis for treatment or other  patient management decisions.  A negative result may occur with  improper specimen collection / handling, submission of specimen other  than nasopharyngeal swab, presence of viral mutation(s) within the  areas targeted by this assay, and inadequate number of viral copies  (<250 copies / mL). A negative result must be combined with clinical  observations, patient history, and epidemiological information. If result is POSITIVE SARS-CoV-2 target nucleic acids are DETECTED. The  SARS-CoV-2 RNA is generally detectable in upper and lower  respiratory specimens dur ing the acute phase of infection.  Positive  results are indicative of active infection with SARS-CoV-2.  Clinical  correlation with patient history and other diagnostic information is  necessary to determine patient infection status.  Positive results do  not rule out bacterial infection or co-infection with other viruses. If result is PRESUMPTIVE POSTIVE SARS-CoV-2 nucleic acids MAY BE PRESENT.   A presumptive positive result was obtained on the submitted specimen  and confirmed on repeat testing.  While 2019 novel coronavirus  (SARS-CoV-2) nucleic acids may be present in the submitted sample  additional confirmatory testing may be necessary for epidemiological  and / or clinical management purposes  to differentiate  between  SARS-CoV-2 and other Sarbecovirus currently known to infect humans.  If clinically indicated additional testing with an alternate test  methodology [phone]) is advised. The SARS-CoV-2 RNA is generally  detectable in upper and lower respiratory sp ecimens during the acute  phase of infection. The expected result is Negative. Fact Sheet for Patients:  StrictlyIdeas.no Fact Sheet for Healthcare Providers: BankingDealers.co.za This test is not yet approved or cleared by the [place] FDA and has been authorized for detection and/or diagnosis of SARS-CoV-2 by FDA under an Emergency Use Authorization (EUA).  This EUA will remain in effect (meaning this test can be used) for the duration of the COVID-19 declaration under Section 564(b)(1) of the Act, 21 U.S.C. section 360bbb-3(b)(1), unless the authorization is terminated or revoked sooner. Performed at [facility], [address], [place], [place] [address]      Labs: Basic Metabolic Panel: Recent Labs  Lab 08/25/18 1135  NA 139  K 3.7  CL 101  CO2 27  GLUCOSE 145*  BUN  18  CREATININE 0.85  CALCIUM 8.2*   Liver Function Tests: Recent Labs  Lab 08/25/18 1135  AST 34  ALT 32  ALKPHOS 77  BILITOT 1.0  PROT 7.9  ALBUMIN 3.2*   Recent Labs  Lab 08/25/18 1135  LIPASE 25   No results for input(s): AMMONIA in the last 168 hours. CBC: Recent Labs  Lab 08/25/18 1116  WBC 5.0  NEUTROABS 3.2  HGB 13.7  HCT 45.1  MCV 96.0  PLT 252   Cardiac Enzymes: No results for input(s): CKTOTAL, CKMB, CKMBINDEX, TROPONINI in the last 168 hours. BNP: BNP (last 3 results) Recent Labs    07/19/18 0550 08/25/18 1116  BNP 103.0* 130.0*    ProBNP (last 3 results) No results for input(s): PROBNP in the last 8760 hours.  CBG: Recent Labs  Lab 08/25/18 1639  GLUCAP 117*       Signed:  Rolla Plate MD.  [facility] 08/25/2018, 8:38 PM

## 2018-08-25 NOTE — ED Notes (Signed)
Margreta Journey, RN notified of delay in transportation.

## 2018-08-25 NOTE — ED Triage Notes (Signed)
Patient c/o lower abdominal pain with nausea and diarrhea x 2 days.

## 2018-08-25 NOTE — ED Notes (Signed)
Pt given water with EDP approval.

## 2018-08-25 NOTE — H&P (Signed)
History and Physical    [name]:[id number] DOB: 09-17-1955 DOA: 08/25/2018  PCP: Monico Blitz, MD Patient coming from: Home  Chief Complaint: Abdominal pain HPI: [name] is a 63 y.o. female with medical history significant of coronary artery disease, diabetes mellitus type 2, mild aortic stenosis, COPD, essential hypertension, hyperlipidemia, morbid obesity with BMI greater than 35 came to the hospital with complaints of abdominal pain.  Patient states she has been having lower abdominal pain for the past 2-3 days has progressively getting worse.  During this time she is also noted to have some subjective fevers and chills and 3-4 episodes of loose diarrhea over the last 24 hours.  She denies vomiting but does feel nauseous at times. She denies any alcohol use, smokes half pack of cigarettes daily. Denies having similar symptoms in the past.  No exposure to COVID.  In the ER patient was noted to have distended abdomen, relatively normal labs.  CT of the abdomen pelvis showed anasarca, mesenteric stranding consistent with cellulitis, hepatomegaly, hepatic steatosis and pleural effusion.  KUB with chest x-ray confirmed right-sided pleural effusion.  She was also noted to be slightly hypoxic on 87% on room air.  Medical team was requested to admit the patient.   Review of Systems: As per HPI otherwise 10 point review of systems negative.  Review of Systems Otherwise negative except as per HPI, including: General: Denies fever, chills, night sweats or unintended weight loss. Resp: Denies cough, wheezing, shortness of breath. Cardiac: Denies chest pain, palpitations, orthopnea, paroxysmal nocturnal dyspnea. GI: Denies  constipation GU: Denies dysuria, frequency, hesitancy or incontinence MS: Denies muscle aches, joint pain or swelling Neuro: Denies headache, neurologic deficits (focal weakness, numbness, tingling), abnormal gait Psych: Denies anxiety, depression, SI/HI/AVH  Skin: Denies new rashes or lesions ID: Denies sick contacts, exotic exposures, travel  Past Medical History:  Diagnosis Date  . Anxiety   . Aortic stenosis    a. Mild by 2D ECHO 11/17/14  . CAD (coronary artery disease)    a. 10/2014: Ant STEMI s/p DES to dLAD  . Carotid disease, bilateral ([place])    a. Duplex 12/2014: mild-mod atherosclerotic plaque without hemodynamically significant stenosis.  [name] COPD (chronic obstructive pulmonary disease) ([place])   . Essential hypertension   . HLD (hyperlipidemia)   . Morbid obesity ([place])   . Myocardial infarction ([place]) 11/17/2014  . PONV (postoperative nausea and vomiting)   . Shingles   . Sleep apnea    uses CPAP  . Type 2 diabetes mellitus ([place])     Past Surgical History:  Procedure Laterality Date  . ABDOMINAL HYSTERECTOMY    . BREAST BIOPSY Left    2016  . CARDIAC CATHETERIZATION N/A 11/17/2014   Procedure: Left Heart Cath and Coronary Angiography;  Surgeon: Wellington Hampshire, MD;  Location: [facility];  Service: Cardiovascular;  Laterality: N/A;  . CARDIAC CATHETERIZATION N/A 11/17/2014   Procedure: Coronary Stent Intervention;  Surgeon: Wellington Hampshire, MD;  Location: [facility];  Service: Cardiovascular;  Laterality: N/A;  distal lad 2.25x20 promus  . CATARACT EXTRACTION W/PHACO Left 09/20/2016   Procedure: CATARACT EXTRACTION PHACO AND INTRAOCULAR LENS PLACEMENT (IOC);  Surgeon: Tonny Branch, MD;  Location: [facility];  Service: Ophthalmology;  Laterality: Left;  CDE: 9.27  . CATARACT EXTRACTION W/PHACO Right 10/18/2016   Procedure: CATARACT EXTRACTION PHACO AND INTRAOCULAR LENS PLACEMENT (IOC);  Surgeon: Tonny Branch, MD;  Location: [facility];  Service: Ophthalmology;  Laterality: Right;  CDE:  6.38  . CHOLECYSTECTOMY    . SPINAL FUSION     cervical; screws and plates.    SOCIAL HISTORY:  reports that she has been smoking cigarettes. She has a 7.50 pack-year smoking history. She has never used smokeless tobacco. She reports  that she does not drink alcohol or use drugs.  Allergies  Allergen Reactions  . Codeine Nausea And Vomiting  . Sulfur Rash    FAMILY HISTORY: Family History  Problem Relation Age of Onset  . Heart attack Father   . Heart disease Father   . Cancer Brother      Prior to Admission medications   Medication Sig Start Date End Date Taking? Authorizing Provider  acetaminophen (TYLENOL) 500 MG tablet Take 500 mg by mouth daily as needed for moderate pain or headache.   Yes [provider]  albuterol (PROVENTIL HFA;VENTOLIN HFA) 108 (90 Base) MCG/ACT inhaler Inhale 2 puffs into the lungs every 4 (four) hours as needed for wheezing or shortness of breath. 05/17/15  Yes Francine Graven, DO  albuterol (PROVENTIL) (2.5 MG/3ML) 0.083% nebulizer solution Take 3 mLs (2.5 mg total) by nebulization every 4 (four) hours as needed for wheezing or shortness of breath. 05/17/15  Yes Francine Graven, DO  aspirin EC 81 MG tablet Take 81 mg by mouth daily.   Yes [provider]  buPROPion (WELLBUTRIN XL) 150 MG 24 hr tablet Take 1 tablet by mouth at bedtime. 06/27/18  Yes [provider]  FARXIGA 10 MG TABS tablet Take 10 mg by mouth daily. 07/01/18  Yes [provider]  fluconazole (DIFLUCAN) 100 MG tablet Take 100 mg by mouth daily. 07/24/18  Yes [provider]  furosemide (LASIX) 20 MG tablet TAKE 1 TABLET BY MOUTH EVERY DAY 06/26/18  Yes Satira Sark, MD  insulin aspart (NOVOLOG) 100 UNIT/ML injection Inject 35 Units into the skin 3 (three) times daily between meals.   Yes [provider]  LEVEMIR FLEXTOUCH 100 UNIT/ML Pen Inject 80 Units into the skin 2 (two) times daily. 03/29/18  Yes [provider]  lisinopril (ZESTRIL) 10 MG tablet TAKE 1 TABLET BY MOUTH EVERY DAY 06/26/18  Yes Satira Sark, MD  meclizine (ANTIVERT) 25 MG tablet Take 1 tablet (25 mg total) by mouth 3 (three) times daily as needed for dizziness. 08/12/17  Yes Julianne Rice, MD  metFORMIN (GLUCOPHAGE) 1000 MG tablet Take 1,000 mg by mouth 2 (two) times daily.  09/26/14  Yes [provider]  metoprolol tartrate (LOPRESSOR) 25 MG tablet TAKE 1/2 TABLET BY MOUTH TWICE A DAY 03/16/18  Yes Satira Sark, MD  Vitamin D, Ergocalciferol, (DRISDOL) 50000 units CAPS capsule Take 50,000 Units by mouth every Saturday. 08/30/16  Yes [provider]  atorvastatin (LIPITOR) 10 MG tablet TAKE 1 TABLET (10 MG TOTAL) BY MOUTH EVERY OTHER DAY. TAKE AT [facility] 10/05/17 01/03/18  Satira Sark, MD  benzonatate (TESSALON) 100 MG capsule Take 2 capsules (200 mg total) by mouth 3 (three) times daily as needed. Patient not taking: Reported on 08/25/2018 04/04/18   Evalee Jefferson, PA-C  doxycycline (VIBRAMYCIN) 100 MG capsule Take 1 capsule (100 mg total) by mouth 2 (two) times daily. One po bid x 7 days Patient not taking: Reported on 08/25/2018 07/19/18   Elnora Morrison, MD  predniSONE (DELTASONE) 20 MG tablet 3 tabs po day one, then 2 tabs daily x 4 days Patient not taking: Reported on 08/25/2018 07/19/18   Elnora Morrison, MD  traMADol Veatrice Bourbon) 50  MG tablet Take 1 tablet (50 mg total) by mouth every 6 (six) hours as needed. Patient not taking: Reported on 08/25/2018 04/06/17   Kem Parkinson, PA-C    Physical Exam: Vitals:   08/25/18 1051 08/25/18 1200 08/25/18 1517  BP: (!) 158/69 (!) 134/53 139/70  Pulse: 77 67 73  Resp: 20 (!) 25 (!) 22  Temp: 98 F (36.7 C)    TempSrc: Oral    SpO2: (!) 87% 99% 95%      Constitutional: NAD, calm, comfortable Eyes: PERRL, lids and conjunctivae normal ENMT: Mucous membranes are moist. Posterior pharynx clear of any exudate or lesions.Normal dentition.  Neck: normal, supple, no masses, no thyromegaly Respiratory: Diffuse diminished breath sounds secondary to her body habitus but bibasilar crackles Cardiovascular: Regular rate and rhythm, no murmurs / rubs / gallops. No extremity edema. 2+ pedal pulses. No carotid bruits.   Abdomen: Abdomen is tense and distended Musculoskeletal: no clubbing / cyanosis. No joint deformity upper and lower extremities. Good ROM, no contractures. Normal muscle tone.  Skin: Significant amount of cellulitis noted on bilateral groin area.  Some erythema, warm to touch.  Surgical scars on abdomen noted. Neurologic: CN 2-12 grossly intact. Sensation intact, DTR normal. Strength 5/5 in all 4.  Psychiatric: Normal judgment and insight. Alert and oriented x 3. Normal mood.     Labs on Admission: I have personally reviewed following labs and imaging studies  CBC: Recent Labs  Lab 08/25/18 1116  WBC 5.0  NEUTROABS 3.2  HGB 13.7  HCT 45.1  MCV 96.0  PLT 242   Basic Metabolic Panel: Recent Labs  Lab 08/25/18 1135  NA 139  K 3.7  CL 101  CO2 27  GLUCOSE 145*  BUN 18  CREATININE 0.85  CALCIUM 8.2*   GFR: CrCl cannot be calculated (Unknown ideal weight.). Liver Function Tests: Recent Labs  Lab 08/25/18 1135  AST 34  ALT 32  ALKPHOS 77  BILITOT 1.0  PROT 7.9  ALBUMIN 3.2*   Recent Labs  Lab 08/25/18 1135  LIPASE 25   No results for input(s): AMMONIA in the last 168 hours. Coagulation Profile: No results for input(s): INR, PROTIME in the last 168 hours. Cardiac Enzymes: No results for input(s): CKTOTAL, CKMB, CKMBINDEX, TROPONINI in the last 168 hours. BNP (last 3 results) No results for input(s): PROBNP in the last 8760 hours. HbA1C: No results for input(s): HGBA1C in the last 72 hours. CBG: No results for input(s): GLUCAP in the last 168 hours. Lipid Profile: No results for input(s): CHOL, HDL, LDLCALC, TRIG, CHOLHDL, LDLDIRECT in the last 72 hours. Thyroid Function Tests: No results for input(s): TSH, T4TOTAL, FREET4, T3FREE, THYROIDAB in the last 72 hours. Anemia Panel: No results for input(s): VITAMINB12, FOLATE, FERRITIN, TIBC, IRON, RETICCTPCT in the last 72 hours. Urine analysis:    Component Value Date/Time   COLORURINE YELLOW 08/25/2018  1054   APPEARANCEUR HAZY (A) 08/25/2018 1054   LABSPEC 1.026 08/25/2018 1054   PHURINE 5.0 08/25/2018 1054   GLUCOSEU >=500 (A) 08/25/2018 1054   HGBUR NEGATIVE 08/25/2018 1054   [place] 08/25/2018 1054   [place] 08/25/2018 1054   PROTEINUR 100 (A) 08/25/2018 1054   NITRITE NEGATIVE 08/25/2018 1054   LEUKOCYTESUR MODERATE (A) 08/25/2018 1054   Sepsis Labs: !!!!!!!!!!!!!!!!!!!!!!!!!!!!!!!!!!!!!!!!!!!! @LABRCNTIP (procalcitonin:4,lacticidven:4) )No results found for this or any previous visit (from the past 240 hour(s)).   Radiological Exams on Admission: Ct Abdomen Pelvis W Contrast  Result Date: 08/25/2018 CLINICAL DATA:  Abdominal pain EXAM: CT  ABDOMEN AND PELVIS WITH CONTRAST TECHNIQUE: Multidetector CT imaging of the abdomen and pelvis was performed using the standard protocol following bolus administration of intravenous contrast. CONTRAST:  141mL OMNIPAQUE IOHEXOL 300 MG/ML  SOLN COMPARISON:  Jun 11, 2014 FINDINGS: Lower chest: There is a moderate free-flowing pleural effusion on the right. Lung bases otherwise appear clear. Hepatobiliary: Liver measures 21.0 cm in length. There is diffuse hepatic steatosis. No focal liver lesions are evident. Gallbladder is absent. There is no appreciable biliary duct dilatation. Pancreas: There is no pancreatic mass or inflammatory focus. Spleen: No splenic lesions are evident. Adrenals/Urinary Tract: Right adrenal appears normal. Left adrenal hypertrophy appear stable compared to the 2016 study. There is no appreciable renal mass or hydronephrosis on either side. There is no evident renal or ureteral calculus on either side. Urinary bladder is essentially decompressed at this time. There is no urinary bladder wall thickening demonstrable. Stomach/Bowel: There is no appreciable bowel wall or mesenteric thickening. There is no evident bowel obstruction. Terminal ileum appears normal. There is mild lipomatous infiltration of the  ileocecal valve. There is no free air or portal venous air. Vascular/Lymphatic: There are foci of aortic and iliac artery atherosclerosis. No aneurysm evident. No adenopathy is appreciable in the abdomen or pelvis. Reproductive: Uterus absent.  No pelvic mass evident. Other: Appendix unremarkable without periappendiceal region inflammation. There is no evident abscess in the abdomen or pelvis. There is fairly mild ascites in the dependent portion of the pelvis. There is slight soft tissue thickening in the mesentery. There is more extensive and diffuse soft tissue stranding throughout the abdominal wall consistent with a degree of anasarca. There is diffuse thickening along the anterior abdominal wall on both right and left sides, slightly more on the right than on the left, which may represent a degree of cellulitis. No fluid collection or abscess noted in the abdominal wall regions. Musculoskeletal: There is degenerative change in the lumbar spine. There are no blastic or lytic bone lesions. No intramuscular lesions are evident. IMPRESSION: 1. Suspect anasarca with edema in the abdominal and pelvic wall regions as well as a much lesser degree of soft tissue thickening in the abdominal mesentery. Question cellulitis along the right and left anterior abdominal and pelvic walls. No abdominal wall fluid collections. 2. Incomplete visualization of moderate free-flowing right pleural effusion. There is fairly mild ascites in the pelvis. 3. No demonstrable bowel obstruction. No abscess in the abdomen or pelvis. Appendiceal region appears normal. 4. No evident renal or ureteral calculus. No hydronephrosis on either side. 5. Hepatic steatosis. Liver is prominent, measuring 21.0 cm in length. No focal liver lesions evident. 6.  Gallbladder absent.  Uterus absent. 7.  Aortic Atherosclerosis (ICD10-I70.0). Electronically Signed   By: Lowella Grip III M.D.   On: 08/25/2018 14:13   Dg Abd Acute 2+v W 1v Chest  Result  Date: 08/25/2018 CLINICAL DATA:  Abdominal pain, short of breath EXAM: DG ABDOMEN ACUTE W/ 1V CHEST COMPARISON:  None. FINDINGS: Anterior cervical fusion noted. Normal cardiac silhouette. There is central venous congestion. Small RIGHT effusion. No focal consolidation. No dilated large or small bowel. Cholecystectomy clips noted. Gas in the rectum. No organomegaly. No pathologic calcifications. IMPRESSION: 1. Central venous congestion and RIGHT pleural effusion suggest volume overload. 2. No bowel obstruction. Electronically Signed   By: Suzy Bouchard M.D.   On: 08/25/2018 12:46     All images have been reviewed by me personally.  EKG: Independently reviewed. No Acute ST T changes. Says prolong  Qt but EKG isnt picking it up correctly.   Assessment/Plan Principal Problem:   Abdominal wall cellulitis Active Problems:   CAD (coronary artery disease)   COPD (chronic obstructive pulmonary disease) ([facility])   Hypertension   DM type 2 causing vascular disease ([facility])   Aortic stenosis   Morbid obesity ([facility])   Anasarca   Hypoxia   Abdominal pain with diarrhea Abdominal wall cellulitis, nonpurulent - CT of the abdomen pelvis shows anasarca, hepatic steatosis, mesenteric stranding suggestive of cellulitis. - IV vancomycin and Zosyn -Right upper quadrant ultrasound. -We will also give Diflucan to cover for any candidal infection in that area. -Supportive care. -Check stool studies-GI panel, C. difficile.  Anasarca-ascites/right-sided pleural effusion Hypoxia - Fluid restriction, IV Lasix 40 mg every 12 hours -Monitor urine output.  Supplemental oxygen as needed. -Echocardiogram - ordered -Right upper quadrant ultrasound.  Mild urinary tract infection -Already on Zosyn.  Will send urine cultures.  Diabetes mellitus type 2, insulin-dependent - Holding off on oral hypoglycemic drip.  Will order reduced dose of Levemir and pre-meal insulin. -Insulin sliding scale and Accu-Chek.  Essential  hypertension - Continue home medications.  History of COPD -Stable.  Bronchodilators PRN.  History of coronary artery disease -Currently patient is chest pain-free.  No evidence for ACS.  Resume home medications.  Echocardiogram.  DVT prophylaxis: Lovenox Code Status: Full code Family Communication: None at bedside Disposition Plan: Hospital admission to [place] floor Consults called: None Admission status: Inpatient admission for IV antibiotics   Time Spent: 65 minutes.  >50% of the time was devoted to discussing the patients care, assessment, plan and disposition with other care givers along with counseling the patient about the risks and benefits of treatment.    Ima Hafner Arsenio Loader MD [facility]  If 7PM-7AM, please contact night-coverage [email or web address]  08/25/2018, 3:48 PM

## 2018-08-26 LAB — GASTROINTESTINAL PANEL BY PCR, STOOL (REPLACES STOOL CULTURE)

## 2018-08-26 LAB — URINE CULTURE

## 2018-08-26 LAB — HIV ANTIBODY (ROUTINE TESTING W REFLEX): HIV Screen 4th Generation wRfx: NONREACTIVE

## 2018-08-27 ENCOUNTER — Emergency Department (HOSPITAL_COMMUNITY): Payer: BC Managed Care – PPO

## 2018-08-27 ENCOUNTER — Encounter (HOSPITAL_COMMUNITY): Payer: Self-pay | Admitting: Emergency Medicine

## 2018-08-27 ENCOUNTER — Other Ambulatory Visit: Payer: Self-pay

## 2018-08-27 ENCOUNTER — Inpatient Hospital Stay (HOSPITAL_COMMUNITY)
Admission: EM | Admit: 2018-08-27 | Discharge: 2018-08-29 | Disposition: A | Discharge status: Home / Self Care | Payer: BC Managed Care – PPO | Source: Home / Self Care | Attending: Internal Medicine | Admitting: Internal Medicine

## 2018-08-27 DIAGNOSIS — R601 Generalized edema: Secondary | ICD-10-CM | POA: Diagnosis present

## 2018-08-27 DIAGNOSIS — E785 Hyperlipidemia, unspecified: Secondary | ICD-10-CM | POA: Diagnosis present

## 2018-08-27 DIAGNOSIS — E876 Hypokalemia: Secondary | ICD-10-CM | POA: Diagnosis present

## 2018-08-27 DIAGNOSIS — I509 Heart failure, unspecified: Secondary | ICD-10-CM

## 2018-08-27 DIAGNOSIS — I2510 Atherosclerotic heart disease of native coronary artery without angina pectoris: Secondary | ICD-10-CM | POA: Diagnosis present

## 2018-08-27 DIAGNOSIS — F17200 Nicotine dependence, unspecified, uncomplicated: Secondary | ICD-10-CM | POA: Diagnosis present

## 2018-08-27 DIAGNOSIS — J449 Chronic obstructive pulmonary disease, unspecified: Secondary | ICD-10-CM | POA: Diagnosis present

## 2018-08-27 DIAGNOSIS — I5031 Acute diastolic (congestive) heart failure: Secondary | ICD-10-CM | POA: Diagnosis present

## 2018-08-27 DIAGNOSIS — F17210 Nicotine dependence, cigarettes, uncomplicated: Secondary | ICD-10-CM | POA: Diagnosis present

## 2018-08-27 DIAGNOSIS — E1159 Type 2 diabetes mellitus with other circulatory complications: Secondary | ICD-10-CM | POA: Diagnosis present

## 2018-08-27 DIAGNOSIS — R7989 Other specified abnormal findings of blood chemistry: Secondary | ICD-10-CM

## 2018-08-27 DIAGNOSIS — I5032 Chronic diastolic (congestive) heart failure: Secondary | ICD-10-CM | POA: Diagnosis present

## 2018-08-27 DIAGNOSIS — J9601 Acute respiratory failure with hypoxia: Secondary | ICD-10-CM | POA: Diagnosis present

## 2018-08-27 DIAGNOSIS — J9621 Acute and chronic respiratory failure with hypoxia: Secondary | ICD-10-CM | POA: Diagnosis present

## 2018-08-27 DIAGNOSIS — E782 Mixed hyperlipidemia: Secondary | ICD-10-CM | POA: Diagnosis present

## 2018-08-27 LAB — CBC
HCT: 45.1 % (ref 36.0–46.0)
HCT: 41.8 % (ref 36.0–46.0)
Hemoglobin: 13.9 g/dL (ref 12.0–15.0)
Hemoglobin: 13.1 g/dL (ref 12.0–15.0)
MCH: 29.6 pg (ref 26.0–34.0)
MCH: 29.7 pg (ref 26.0–34.0)
MCHC: 30.8 g/dL (ref 30.0–36.0)
MCHC: 31.3 g/dL (ref 30.0–36.0)
MCV: 96 fL (ref 80.0–100.0)
MCV: 94.8 fL (ref 80.0–100.0)
Platelets: 247 10*3/uL (ref 150–400)
Platelets: 227 10*3/uL (ref 150–400)
RBC: 4.7 MIL/uL (ref 3.87–5.11)
RBC: 4.41 MIL/uL (ref 3.87–5.11)
RDW: 15.9 % — ABNORMAL HIGH (ref 11.5–15.5)
RDW: 15.9 % — ABNORMAL HIGH (ref 11.5–15.5)
WBC: 5.4 10*3/uL (ref 4.0–10.5)
WBC: 5.4 10*3/uL (ref 4.0–10.5)
nRBC: 0 % (ref 0.0–0.2)
nRBC: 0 % (ref 0.0–0.2)

## 2018-08-27 LAB — SARS CORONAVIRUS 2 BY RT PCR (HOSPITAL ORDER, PERFORMED IN ~~LOC~~ HOSPITAL LAB): SARS Coronavirus 2: NEGATIVE

## 2018-08-27 LAB — BASIC METABOLIC PANEL
Anion gap: 12 (ref 5–15)
BUN: 13 mg/dL (ref 8–23)
CO2: 27 mmol/L (ref 22–32)
Calcium: 8.5 mg/dL — ABNORMAL LOW (ref 8.9–10.3)
Chloride: 100 mmol/L (ref 98–111)
Creatinine, Ser: 0.96 mg/dL (ref 0.44–1.00)
GFR calc Af Amer: >60 mL/min (ref >60)
GFR calc non Af Amer: >60 mL/min (ref >60)
Glucose, Bld: 175 mg/dL — ABNORMAL HIGH (ref 70–99)
Potassium: 3.3 mmol/L — ABNORMAL LOW (ref 3.5–5.1)
Sodium: 139 mmol/L (ref 135–145)

## 2018-08-27 LAB — CREATININE, SERUM
Creatinine, Ser: 1 mg/dL (ref 0.44–1.00)
GFR calc Af Amer: >60 mL/min (ref >60)
GFR calc non Af Amer: >60 mL/min (ref >60)

## 2018-08-27 LAB — LACTIC ACID, PLASMA
Lactic Acid, Venous: 1.2 mmol/L (ref 0.5–1.9)
Lactic Acid, Venous: 1.3 mmol/L (ref 0.5–1.9)

## 2018-08-27 LAB — GLUCOSE, CAPILLARY: Glucose-Capillary: 166 mg/dL — ABNORMAL HIGH (ref 70–99)

## 2018-08-27 LAB — TROPONIN I (HIGH SENSITIVITY)
Troponin I (High Sensitivity): 61 ng/L — ABNORMAL HIGH (ref <18)
Troponin I (High Sensitivity): 67 ng/L — ABNORMAL HIGH (ref <18)

## 2018-08-27 LAB — BRAIN NATRIURETIC PEPTIDE: B Natriuretic Peptide: 188.3 pg/mL — ABNORMAL HIGH (ref 0.0–100.0)

## 2018-08-27 MED ORDER — SODIUM CHLORIDE 0.9 % IV SOLN: 250.0000 mL | INTRAVENOUS | Status: DC | PRN

## 2018-08-27 MED ORDER — ORAL CARE MOUTH RINSE
15.0000 mL | Freq: Two times a day (BID) | OROMUCOSAL | Status: DC
  Administered 2018-08-27 – 2018-08-29 (×4): 15 mL via OROMUCOSAL

## 2018-08-27 MED ORDER — FUROSEMIDE 10 MG/ML IJ SOLN
40.0000 mg | Freq: Once | INTRAMUSCULAR | Status: AC
  Administered 2018-08-27: 40 mg via INTRAVENOUS
  Filled 2018-08-27: qty 4

## 2018-08-27 MED ORDER — SODIUM CHLORIDE 0.9 % IV SOLN
1.0000 g | Freq: Once | INTRAVENOUS | Status: AC
  Administered 2018-08-27: 1 g via INTRAVENOUS
  Filled 2018-08-27: qty 10

## 2018-08-27 MED ORDER — ASPIRIN EC 81 MG PO TBEC
81.0000 mg | DELAYED_RELEASE_TABLET | Freq: Every day | ORAL | Status: DC
  Administered 2018-08-27 – 2018-08-29 (×3): 81 mg via ORAL
  Filled 2018-08-27 (×3): qty 1

## 2018-08-27 MED ORDER — ATORVASTATIN CALCIUM 10 MG PO TABS
10.0000 mg | ORAL_TABLET | ORAL | Status: DC
  Administered 2018-08-28: 10 mg via ORAL
  Filled 2018-08-27: qty 1

## 2018-08-27 MED ORDER — VANCOMYCIN HCL 10 G IV SOLR
2500.0000 mg | Freq: Once | INTRAVENOUS | Status: AC
  Administered 2018-08-27: 2500 mg via INTRAVENOUS
  Filled 2018-08-27: qty 2500

## 2018-08-27 MED ORDER — SODIUM CHLORIDE 0.9% FLUSH: 3.0000 mL | INTRAVENOUS | Status: DC | PRN

## 2018-08-27 MED ORDER — ASPIRIN EC 81 MG PO TBEC: 81.0000 mg | DELAYED_RELEASE_TABLET | Freq: Every day | ORAL | Status: DC

## 2018-08-27 MED ORDER — LISINOPRIL 10 MG PO TABS
10.0000 mg | ORAL_TABLET | Freq: Every day | ORAL | Status: DC
  Administered 2018-08-28 – 2018-08-29 (×2): 10 mg via ORAL
  Filled 2018-08-27 (×2): qty 1

## 2018-08-27 MED ORDER — METOPROLOL TARTRATE 12.5 MG HALF TABLET
12.5000 mg | ORAL_TABLET | Freq: Two times a day (BID) | ORAL | Status: DC
  Administered 2018-08-27 – 2018-08-29 (×4): 12.5 mg via ORAL
  Filled 2018-08-27 (×4): qty 1

## 2018-08-27 MED ORDER — ALBUTEROL SULFATE (2.5 MG/3ML) 0.083% IN NEBU: 3.0000 mL | INHALATION_SOLUTION | RESPIRATORY_TRACT | Status: DC | PRN

## 2018-08-27 MED ORDER — ZOLPIDEM TARTRATE 5 MG PO TABS
5.0000 mg | ORAL_TABLET | Freq: Every evening | ORAL | Status: DC | PRN
  Administered 2018-08-28: 5 mg via ORAL
  Filled 2018-08-27: qty 1

## 2018-08-27 MED ORDER — HYDROCORTISONE (PERIANAL) 2.5 % EX CREA
1.0000 "application " | TOPICAL_CREAM | Freq: Four times a day (QID) | CUTANEOUS | Status: DC | PRN
  Filled 2018-08-27: qty 28.35

## 2018-08-27 MED ORDER — ALBUTEROL SULFATE HFA 108 (90 BASE) MCG/ACT IN AERS
4.0000 | INHALATION_SPRAY | Freq: Once | RESPIRATORY_TRACT | Status: AC
  Administered 2018-08-27: 4 via RESPIRATORY_TRACT
  Filled 2018-08-27: qty 6.7

## 2018-08-27 MED ORDER — SODIUM CHLORIDE 0.9 % IV SOLN
500.0000 mg | Freq: Once | INTRAVENOUS | Status: AC
  Administered 2018-08-27: 500 mg via INTRAVENOUS
  Filled 2018-08-27: qty 500

## 2018-08-27 MED ORDER — INSULIN DETEMIR 100 UNIT/ML ~~LOC~~ SOLN
80.0000 [IU] | Freq: Two times a day (BID) | SUBCUTANEOUS | Status: DC
  Administered 2018-08-27 – 2018-08-29 (×4): 80 [IU] via SUBCUTANEOUS
  Filled 2018-08-27 (×5): qty 0.8

## 2018-08-27 MED ORDER — CANAGLIFLOZIN 100 MG PO TABS
100.0000 mg | ORAL_TABLET | Freq: Every day | ORAL | Status: DC
  Administered 2018-08-28 – 2018-08-29 (×2): 100 mg via ORAL
  Filled 2018-08-27 (×2): qty 1

## 2018-08-27 MED ORDER — ESCITALOPRAM OXALATE 10 MG PO TABS
20.0000 mg | ORAL_TABLET | Freq: Two times a day (BID) | ORAL | Status: DC
  Administered 2018-08-27 – 2018-08-29 (×4): 20 mg via ORAL
  Filled 2018-08-27 (×5): qty 2

## 2018-08-27 MED ORDER — SODIUM CHLORIDE 0.9% FLUSH
3.0000 mL | Freq: Two times a day (BID) | INTRAVENOUS | Status: DC
  Administered 2018-08-27 – 2018-08-29 (×4): 3 mL via INTRAVENOUS

## 2018-08-27 MED ORDER — CLINDAMYCIN HCL 300 MG PO CAPS: 600.0000 mg | ORAL_CAPSULE | Freq: Three times a day (TID) | ORAL | Status: DC

## 2018-08-27 MED ORDER — HYDROXYZINE HCL 25 MG PO TABS
25.0000 mg | ORAL_TABLET | Freq: Once | ORAL | Status: AC
  Administered 2018-08-27: 25 mg via ORAL
  Filled 2018-08-27: qty 1

## 2018-08-27 MED ORDER — FUROSEMIDE 10 MG/ML IJ SOLN
40.0000 mg | Freq: Two times a day (BID) | INTRAMUSCULAR | Status: DC
  Administered 2018-08-28 – 2018-08-29 (×3): 40 mg via INTRAVENOUS
  Filled 2018-08-27 (×3): qty 4

## 2018-08-27 MED ORDER — ENOXAPARIN SODIUM 40 MG/0.4ML ~~LOC~~ SOLN
40.0000 mg | SUBCUTANEOUS | Status: DC
  Administered 2018-08-27 – 2018-08-28 (×2): 40 mg via SUBCUTANEOUS
  Filled 2018-08-27 (×2): qty 0.4

## 2018-08-27 MED ORDER — SODIUM CHLORIDE 0.9% FLUSH
3.0000 mL | Freq: Once | INTRAVENOUS | Status: AC
  Administered 2018-08-27: 3 mL via INTRAVENOUS

## 2018-08-27 MED ORDER — ONDANSETRON HCL 4 MG/2ML IJ SOLN: 4.0000 mg | Freq: Four times a day (QID) | INTRAMUSCULAR | Status: DC | PRN

## 2018-08-27 MED ORDER — CHLORHEXIDINE GLUCONATE 0.12 % MT SOLN
15.0000 mL | Freq: Two times a day (BID) | OROMUCOSAL | Status: DC
  Administered 2018-08-27 – 2018-08-29 (×4): 15 mL via OROMUCOSAL
  Filled 2018-08-27 (×4): qty 15

## 2018-08-27 MED ORDER — ALPRAZOLAM 0.25 MG PO TABS: 0.2500 mg | ORAL_TABLET | Freq: Two times a day (BID) | ORAL | Status: DC | PRN

## 2018-08-27 MED ORDER — POTASSIUM CHLORIDE CRYS ER 20 MEQ PO TBCR
20.0000 meq | EXTENDED_RELEASE_TABLET | Freq: Two times a day (BID) | ORAL | Status: DC
  Administered 2018-08-27 – 2018-08-29 (×4): 20 meq via ORAL
  Filled 2018-08-27 (×4): qty 1

## 2018-08-27 MED ORDER — ACETAMINOPHEN 325 MG PO TABS: 650.0000 mg | ORAL_TABLET | ORAL | Status: DC | PRN

## 2018-08-27 NOTE — ED Notes (Signed)
ED TO INPATIENT HANDOFF REPORT  ED Nurse Name and Phone #: Maudie Mercury [phone]  S Name/Age/Gender [name] 63 y.o. female Room/Bed: 030C/030C  Code Status   Code Status: Prior  Home/SNF/Other Home Patient oriented to: self, place, time and situation Is this baseline? Yes   Triage Complete: Triage complete  Chief Complaint sob  Triage Note To ED eval of worsening sob and abd pain. Seen at [facility] Friday and due for admit for cellulitis of abd and pleural effusion but left prior to getting inpt bed assigned. Pt states sob is worse today. Skin w/d, resp e/u. Pt placed on 2L [place] at front door by nurse first staff due to room air sat 84%. Sleeps with cpap and without complaints of difficulty sleeping.    Allergies Allergies  Allergen Reactions  . Codeine Nausea And Vomiting  . Sulfur Rash    Level of Care/Admitting Diagnosis ED Disposition    ED Disposition Condition [facility] Area: [place] [[id number]]  Level of Care: Telemetry Cardiac [103]  Covid Evaluation: Asymptomatic Screening Protocol (No Symptoms)  Diagnosis: Acute diastolic CHF (congestive heart failure) [facility] [[id number]]  Admitting Physician: Lady Deutscher [[id number]]  Attending Physician: Lady Deutscher [[id number]]  Estimated length of stay: 3 - 4 days  Certification:: I certify this patient will need inpatient services for at least 2 midnights  PT Class (Do Not Modify): Inpatient [101]  PT Acc Code (Do Not Modify): Private [1]       B Medical/Surgery History Past Medical History:  Diagnosis Date  . Anxiety   . Aortic stenosis    a. Mild by 2D ECHO 11/17/14  . CAD (coronary artery disease)    a. 10/2014: Ant STEMI s/p DES to dLAD  . Carotid disease, bilateral ([place])    a. Duplex 12/2014: mild-mod atherosclerotic plaque without hemodynamically significant stenosis.  [name] COPD (chronic obstructive pulmonary disease) ([place])   . Essential hypertension   . HLD (hyperlipidemia)    . Morbid obesity ([place])   . Myocardial infarction ([place]) 11/17/2014  . PONV (postoperative nausea and vomiting)   . Shingles   . Sleep apnea    uses CPAP  . Type 2 diabetes mellitus ([place])    Past Surgical History:  Procedure Laterality Date  . ABDOMINAL HYSTERECTOMY    . BREAST BIOPSY Left    2016  . CARDIAC CATHETERIZATION N/A 11/17/2014   Procedure: Left Heart Cath and Coronary Angiography;  Surgeon: Wellington Hampshire, MD;  Location: [place] [facility];  Service: Cardiovascular;  Laterality: N/A;  . CARDIAC CATHETERIZATION N/A 11/17/2014   Procedure: Coronary Stent Intervention;  Surgeon: Wellington Hampshire, MD;  Location: [place] [facility];  Service: Cardiovascular;  Laterality: N/A;  distal lad 2.25x20 promus  . CATARACT EXTRACTION W/PHACO Left 09/20/2016   Procedure: CATARACT EXTRACTION PHACO AND INTRAOCULAR LENS PLACEMENT (IOC);  Surgeon: Tonny Branch, MD;  Location: [facility];  Service: Ophthalmology;  Laterality: Left;  CDE: 9.27  . CATARACT EXTRACTION W/PHACO Right 10/18/2016   Procedure: CATARACT EXTRACTION PHACO AND INTRAOCULAR LENS PLACEMENT (IOC);  Surgeon: Tonny Branch, MD;  Location: [facility];  Service: Ophthalmology;  Laterality: Right;  CDE: 6.38  . CHOLECYSTECTOMY    . SPINAL FUSION     cervical; screws and plates.     A IV Location/Drains/Wounds Patient Lines/Drains/Airways Status   Active Line/Drains/Airways    Name:   Placement date:   Placement time:   Site:   Days:  Peripheral IV 08/27/18 Right Forearm   08/27/18    1559    Forearm   less than 1          Intake/Output Last 24 hours No intake or output data in the 24 hours ending 08/27/18 1645  Labs/Imaging Results for orders placed or performed during the hospital encounter of 08/27/18 (from the past 48 hour(s))  Basic metabolic panel     Status: Abnormal   Collection Time: 08/27/18  1:50 PM  Result Value Ref Range   Sodium 139 135 - 145 mmol/L   Potassium 3.3 (L) 3.5 - 5.1 mmol/L   Chloride 100 98 - 111  mmol/L   CO2 27 22 - 32 mmol/L   Glucose, Bld 175 (H) 70 - 99 mg/dL   BUN 13 8 - 23 mg/dL   Creatinine, Ser 0.96 0.44 - 1.00 mg/dL   Calcium 8.5 (L) 8.9 - 10.3 mg/dL   GFR calc non Af Amer >60 >60 mL/min   GFR calc Af Amer >60 >60 mL/min   Anion gap 12 5 - 15    Comment: Performed at [facility], [place] [address], [place], [place] [address]  CBC     Status: Abnormal   Collection Time: 08/27/18  1:50 PM  Result Value Ref Range   WBC 5.4 4.0 - 10.5 K/uL   RBC 4.70 3.87 - 5.11 MIL/uL   Hemoglobin 13.9 12.0 - 15.0 g/dL   HCT 45.1 36.0 - 46.0 %   MCV 96.0 80.0 - 100.0 fL   MCH 29.6 26.0 - 34.0 pg   MCHC 30.8 30.0 - 36.0 g/dL   RDW 15.9 (H) 11.5 - 15.5 %   Platelets 247 150 - 400 K/uL   nRBC 0.0 0.0 - 0.2 %    Comment: Performed at [facility], [place] [address] [place], [place] [address]  Brain natriuretic peptide     Status: Abnormal   Collection Time: 08/27/18  1:50 PM  Result Value Ref Range   B Natriuretic Peptide 188.3 (H) 0.0 - 100.0 pg/mL    Comment: Performed at [facility] [address], [place],  [address]  Troponin I (High Sensitivity)     Status: Abnormal   Collection Time: 08/27/18  1:50 PM  Result Value Ref Range   Troponin I (High Sensitivity) 61 (H) <18 ng/L    Comment: (NOTE) Elevated high sensitivity troponin I (hsTnI) values and significant  changes across serial measurements may suggest ACS but many other  chronic and acute conditions are known to elevate hsTnI results.  Refer to the "Links" section for chest pain algorithms and additional  guidance. Performed at [facility], [place] [address], [place], [place] [address]   Lactic acid, plasma     Status: None   Collection Time: 08/27/18  3:58 PM  Result Value Ref Range   Lactic Acid, Venous 1.2 0.5 - 1.9 mmol/L    Comment: Performed at [facility] [address] [place],  [address]   Dg Chest 2 View  Result Date: 08/27/2018 CLINICAL DATA:  To ED eval of  worsening sob and abd pain. Seen at [facility] Friday and due for admit for cellulitis of abd and pleural effusion but left prior to getting inpt bed assigned. Pt states sob is worse today. O2 stats 84% on RA. Hx MI, COPD, CAD, diabetes EXAM: CHEST - 2 VIEW COMPARISON:  07/19/2018 FINDINGS: Slight increase in bilateral interstitial opacities. Small right  pleural effusion now evident, with adjacent atelectasis/consolidation at the right lung base. Heart size upper limits normal. No pneumothorax. Cervical fixation hardware as before. IMPRESSION: Worsening interstitial edema/infiltrates and small right pleural effusion. Electronically Signed   By: Lucrezia Europe M.D.   On: 08/27/2018 14:55    Pending Labs Unresulted Labs (From admission, onward)    Start     Ordered   08/27/18 1600  SARS Coronavirus 2 [facility] order, Performed in [facility] hospital lab) Nasopharyngeal Nasopharyngeal Swab  (Asymptomatic Patients Labs)  Once,   STAT    Question Answer Comment  Is this test for diagnosis or screening Screening   Symptomatic for COVID-19 as defined by CDC No   Hospitalized for COVID-19 No   Admitted to ICU for COVID-19 No   Previously tested for COVID-19 Yes   Resident in a congregate (group) care setting No   Employed in healthcare setting No   Pregnant No      08/27/18 1601   08/27/18 1535  Lactic acid, plasma  Now then every 2 hours,   STAT     08/27/18 1534          Vitals/Pain Today's Vitals   08/27/18 1545 08/27/18 1600 08/27/18 1615 08/27/18 1630  BP:      Pulse: 65 65 66 65  Resp: (!) 22 (!) 21 (!) 29 15  Temp:      TempSrc:      SpO2: 100% 100% 100% 99%  Weight:      Height:      PainSc:        Isolation Precautions No active isolations  Medications Medications  azithromycin (ZITHROMAX) 500 mg in sodium chloride 0.9 % 250 mL IVPB (has no administration in time range)  vancomycin (VANCOCIN) 2,500 mg in sodium chloride 0.9 % 500 mL IVPB (has no administration in time  range)  sodium chloride flush (NS) 0.9 % injection 3 mL (3 mLs Intravenous Given 08/27/18 1619)  furosemide (LASIX) injection 40 mg (40 mg Intravenous Given 08/27/18 1600)  cefTRIAXone (ROCEPHIN) 1 g in sodium chloride 0.9 % 100 mL IVPB (1 g Intravenous New Bag/Given 08/27/18 1604)  hydrOXYzine (ATARAX/VISTARIL) tablet 25 mg (25 mg Oral Given 08/27/18 1604)  albuterol (VENTOLIN HFA) 108 (90 Base) MCG/ACT inhaler 4 puff (4 puffs Inhalation Given 08/27/18 1615)    Mobility walks with person assist Low fall risk   Focused Assessments Pulmonary Assessment Handoff:  Lung sounds:   O2 Device: Nasal Cannula O2 Flow Rate (L/min): 2 L/min      R Recommendations: See Admitting Provider Note  Report given to:   Additional Notes:

## 2018-08-27 NOTE — ED Notes (Signed)
Pt was 84% on room air. Pt placed on 3 L of . SPO2 came up to 98%.

## 2018-08-27 NOTE — H&P (Signed)
History and Physical    [name]:[id number] DOB: 1956-01-06 DOA: 08/27/2018  PCP: Monico Blitz, MD  Patient coming from: Home  I have personally briefly reviewed patient's old medical records in [place]  Chief Complaint: Shortness of breath and abdominal pain  HPI: [name] is a 63 y.o. female with medical history significant of aortic stenosis, coronary artery disease, COPD, previous myocardial infarction with stent placement, morbid obesity with a BMI of 44, diabetes mellitus type 2 who presents the emergency department with complaints of worsening shortness of breath since approximately July 28.  Patient was seen in our emergency department double of days ago and was offered admission due to congestive heart failure but declined and went home.  Today she when she presented to the emergency department in triage she was noted to have a room air sat of 84%.  Since leaving the emergency department she has developed some chest tightness that she attributes to breathing difficulties.  The tightness is intermittent and seems to get worse when she breathes more deeply.  Is had increasing lower extremity edema and abdominal edema arose when she developed lower extremity cellulitis last week.  She has not started her antibiotics which I believe are clindamycin.  According to pharmacy she has not yet picked them up.  Around July 28 she noted lower abdominal redness and swelling and pain to the skin in that region.  She last received a dose of doxycycline on July 23.  The region and redness of the abdomen reminds her of her cellulitis.  Fevers, chills, nausea vomiting or diarrhea, no deep abdominal pain, no cough, no urinary symptoms or any other complaints.  Her primary cardiologist is Dr. Domenic Polite.  ED Course: Evaluation consistent with congestive heart failure CT scan done on July 28 showed ascites, BNP is elevated, O2 sats are down, patient with pleural effusion and new increasing  oxygen requirement.  No fever or elevation in white count to suggest cellulitis or infection.  Review of Systems: As per HPI otherwise all other systems reviewed and  negative.   Past Medical History:  Diagnosis Date  . Anxiety   . Aortic stenosis    a. Mild by 2D ECHO 11/17/14  . CAD (coronary artery disease)    a. 10/2014: Ant STEMI s/p DES to dLAD  . Carotid disease, bilateral ([place])    a. Duplex 12/2014: mild-mod atherosclerotic plaque without hemodynamically significant stenosis.  [name] COPD (chronic obstructive pulmonary disease) ([place])   . Essential hypertension   . HLD (hyperlipidemia)   . Morbid obesity ([place])   . Myocardial infarction ([place]) 11/17/2014  . PONV (postoperative nausea and vomiting)   . Shingles   . Sleep apnea    uses CPAP  . Type 2 diabetes mellitus ([place])     Past Surgical History:  Procedure Laterality Date  . ABDOMINAL HYSTERECTOMY    . BREAST BIOPSY Left    2016  . CARDIAC CATHETERIZATION N/A 11/17/2014   Procedure: Left Heart Cath and Coronary Angiography;  Surgeon: Wellington Hampshire, MD;  Location: [facility];  Service: Cardiovascular;  Laterality: N/A;  . CARDIAC CATHETERIZATION N/A 11/17/2014   Procedure: Coronary Stent Intervention;  Surgeon: Wellington Hampshire, MD;  Location: [facility];  Service: Cardiovascular;  Laterality: N/A;  distal lad 2.25x20 promus  . CATARACT EXTRACTION W/PHACO Left 09/20/2016   Procedure: CATARACT EXTRACTION PHACO AND INTRAOCULAR LENS PLACEMENT (IOC);  Surgeon: Tonny Branch, MD;  Location: [facility];  Service:  Ophthalmology;  Laterality: Left;  CDE: 9.27  . CATARACT EXTRACTION W/PHACO Right 10/18/2016   Procedure: CATARACT EXTRACTION PHACO AND INTRAOCULAR LENS PLACEMENT (IOC);  Surgeon: Tonny Branch, MD;  Location: [facility];  Service: Ophthalmology;  Laterality: Right;  CDE: 6.38  . CHOLECYSTECTOMY    . SPINAL FUSION     cervical; screws and plates.    Social History   Social History Narrative  . Not on file      reports that she has been smoking cigarettes. She has a 7.50 pack-year smoking history. She has never used smokeless tobacco. She reports that she does not drink alcohol or use drugs.  Allergies  Allergen Reactions  . Codeine Nausea And Vomiting  . Sulfa Antibiotics Rash    Family History  Problem Relation Age of Onset  . Heart attack Father   . Heart disease Father   . Cancer Brother     Prior to Admission medications   Medication Sig Start Date End Date Taking? Authorizing Provider  acetaminophen (TYLENOL) 500 MG tablet Take 500 mg by mouth daily as needed for moderate pain or headache.    [provider]  albuterol (PROVENTIL HFA;VENTOLIN HFA) 108 (90 Base) MCG/ACT inhaler Inhale 2 puffs into the lungs every 4 (four) hours as needed for wheezing or shortness of breath. 05/17/15   Francine Graven, DO  albuterol (PROVENTIL) (2.5 MG/3ML) 0.083% nebulizer solution Take 3 mLs (2.5 mg total) by nebulization every 4 (four) hours as needed for wheezing or shortness of breath. 05/17/15   Francine Graven, DO  aspirin EC 81 MG tablet Take 81 mg by mouth daily.    [provider]  atorvastatin (LIPITOR) 10 MG tablet TAKE 1 TABLET (10 MG TOTAL) BY MOUTH EVERY OTHER DAY. TAKE AT [facility] 10/05/17 01/03/18  Satira Sark, MD  buPROPion (WELLBUTRIN XL) 150 MG 24 hr tablet Take 1 tablet by mouth at bedtime. 06/27/18   [provider]  clindamycin (CLEOCIN) 300 MG capsule Take 2 capsules (600 mg total) by mouth 3 (three) times daily for 10 days. 08/25/18 09/04/18  Zierle-Ghosh, Asia B, DO  FARXIGA 10 MG TABS tablet Take 10 mg by mouth daily. 07/01/18   [provider]  furosemide (LASIX) 20 MG tablet Take 2 tablets (40 mg total) by mouth daily for 5 days. 08/25/18 08/30/18  Zierle-Ghosh, Asia B, DO  hydrocortisone (ANUSOL-HC) 2.5 % rectal cream Apply 1 application topically 4 (four) times daily as needed for hemorrhoids. 08/25/18   Zierle-Ghosh, Asia B, DO  insulin aspart  (NOVOLOG) 100 UNIT/ML injection Inject 35 Units into the skin 3 (three) times daily between meals.    [provider]  LEVEMIR FLEXTOUCH 100 UNIT/ML Pen Inject 80 Units into the skin 2 (two) times daily. 03/29/18   [provider]  lisinopril (ZESTRIL) 10 MG tablet TAKE 1 TABLET BY MOUTH EVERY DAY 06/26/18   Satira Sark, MD  meclizine (ANTIVERT) 25 MG tablet Take 1 tablet (25 mg total) by mouth 3 (three) times daily as needed for dizziness. 08/12/17   Julianne Rice, MD  metFORMIN (GLUCOPHAGE) 1000 MG tablet Take 1,000 mg by mouth 2 (two) times daily.  09/26/14   [provider]  metoprolol tartrate (LOPRESSOR) 25 MG tablet TAKE 1/2 TABLET BY MOUTH TWICE A DAY 03/16/18   Satira Sark, MD  traMADol (ULTRAM) 50 MG tablet Take 1 tablet (50 mg total) by mouth every 6 (six) hours as needed. Patient not taking: Reported on 08/25/2018 04/06/17   Triplett,  Tammy, PA-C  Vitamin D, Ergocalciferol, (DRISDOL) 50000 units CAPS capsule Take 50,000 Units by mouth every Saturday. 08/30/16   [provider]    Physical Exam:  Constitutional: Resting comfortably on the stretcher with supplemental oxygen in place becomes anxious when discussing her heart failure.  In no acute respiratory distress without accessory muscle use. Vitals:   08/27/18 1615 08/27/18 1630 08/27/18 1700 08/27/18 1742  BP:    (!) 146/72  Pulse: 66 65 66 68  Resp: (!) 29 15 (!) 26 19  Temp:    98.5 F (36.9 C)  TempSrc:    Oral  SpO2: 100% 99% 100% 99%  Weight:    117.8 kg  Height:    5\' 4"  (1.626 m)   Eyes: PERRL, lids and conjunctivae normal ENMT: Mucous membranes are moist. Posterior pharynx clear of any exudate or lesions.Normal dentition.  Neck: normal, supple, no masses, no thyromegaly Respiratory: Decreased to auscultation bilaterally, no wheezing, bilateral crackles.  Increased respiratory effort. No accessory muscle use.  Cardiovascular: Regular rate and rhythm, no murmurs / rubs /  gallops.  +2 extremity edema with anasarca above the umbilicus. 2+ pedal pulses. No carotid bruits.  No JVP, no JVD Abdomen: no tenderness, no masses palpated. No hepatosplenomegaly. Bowel sounds positive.  Musculoskeletal: no clubbing / cyanosis. No joint deformity upper and lower extremities. Good ROM, no contractures. Normal muscle tone.  Skin: no rashes, lesions, ulcers. No induration Neurologic: CN 2-12 grossly intact. Sensation intact, DTR normal. Strength 5/5 in all 4.  Psychiatric: Normal judgment and insight. Alert and oriented x 3. Normal mood.    Labs on Admission: I have personally reviewed following labs and imaging studies  CBC: Recent Labs  Lab 08/25/18 1116 08/27/18 1350  WBC 5.0 5.4  NEUTROABS 3.2  --   HGB 13.7 13.9  HCT 45.1 45.1  MCV 96.0 96.0  PLT 252 176   Basic Metabolic Panel: Recent Labs  Lab 08/25/18 1135 08/27/18 1350  NA 139 139  K 3.7 3.3*  CL 101 100  CO2 27 27  GLUCOSE 145* 175*  BUN 18 13  CREATININE 0.85 0.96  CALCIUM 8.2* 8.5*   GFR: Estimated Creatinine Clearance: 76.6 mL/min (by C-G formula based on SCr of 0.96 mg/dL). Liver Function Tests: Recent Labs  Lab 08/25/18 1135  AST 34  ALT 32  ALKPHOS 77  BILITOT 1.0  PROT 7.9  ALBUMIN 3.2*   Recent Labs  Lab 08/25/18 1135  LIPASE 25   HbA1C: Recent Labs    08/25/18 1116  HGBA1C 8.4*   CBG: Recent Labs  Lab 08/25/18 1639  GLUCAP 117*   Urine analysis:    Component Value Date/Time   COLORURINE YELLOW 08/25/2018 1054   APPEARANCEUR HAZY (A) 08/25/2018 1054   LABSPEC 1.026 08/25/2018 1054   PHURINE 5.0 08/25/2018 1054   GLUCOSEU >=500 (A) 08/25/2018 1054   HGBUR NEGATIVE 08/25/2018 1054   BILIRUBINUR NEGATIVE 08/25/2018 [place] 08/25/2018 1054   PROTEINUR 100 (A) 08/25/2018 1054   NITRITE NEGATIVE 08/25/2018 1054   LEUKOCYTESUR MODERATE (A) 08/25/2018 1054    Radiological Exams on Admission: Dg Chest 2 View  Result Date: 08/27/2018  CLINICAL DATA:  To ED eval of worsening sob and abd pain. Seen at [facility] Friday and due for admit for cellulitis of abd and pleural effusion but left prior to getting inpt bed assigned. Pt states sob is worse today. O2 stats 84% on RA. Hx MI, COPD, CAD, diabetes EXAM: CHEST -  2 VIEW COMPARISON:  07/19/2018 FINDINGS: Slight increase in bilateral interstitial opacities. Small right pleural effusion now evident, with adjacent atelectasis/consolidation at the right lung base. Heart size upper limits normal. No pneumothorax. Cervical fixation hardware as before. IMPRESSION: Worsening interstitial edema/infiltrates and small right pleural effusion. Electronically Signed   By: Lucrezia Europe M.D.   On: 08/27/2018 14:55    EKG: Independently reviewed.  Normal sinus rhythm, right axis deviation, low voltages when compared to 7/31 there is no change.  Assessment/Plan Principal Problem:   Acute diastolic CHF (congestive heart failure) (HCC) Active Problems:   Anasarca   Acute respiratory failure with hypoxia (HCC)   Pleural effusion due to CHF (congestive heart failure) (HCC)   CAD (coronary artery disease)   DM type 2 causing vascular disease (HCC)   Hypokalemia   COPD (chronic obstructive pulmonary disease) (HCC)   Current smoker   HLD (hyperlipidemia)   Morbid obesity ([place])    1.  Acute diastolic congestive heart failure with associated anasarca, acute respiratory failure with hypoxia, and pleural effusion due to congestive heart failure: Patient will continue on supplemental oxygen.  We will start her on twice daily Lasix.  Discussed with her at length dietary and fluid restrictions including salt restriction.  Will use a 1200 mL fluid restriction.  Will check daily weights.  Congestive heart failure teaching.  I consulted Dr. Charissa Bash from cardiology due to mildly abnormal troponin.  Repeat is pending.  2.  Coronary artery disease: Mildly elevated troponin repeat is pending.  If repeat shows a delta  of 20 and an increase with then start a heparin drip.  3.  Diabetes mellitus type II with vascular disease: Sliding scale insulin coverage, continue home medications except for metformin.  Dietary discretion.  4.  Hypokalemia: Replete orally recheck in a.m.  5.  COPD: Noted and a current smoker will continue to encourage smoking cessation.  Nebulizers as needed.  6.  Hyperlipidemia: Continue home medication management.  (atorvastatin)  7.  Morbid obesity: Noted  DVT prophylaxis: Enoxaparin Code Status: Full code Family Communication: Spoke with patient's daughter who was present at the bedside Disposition Plan: Likely home in 3 to 4 days for continued outpatient congestive heart failure management program Consults called: Cardiology Dr. Charissa Bash Admission status: Admit - It is my clinical opinion that admission to INPATIENT is reasonable and necessary because of the expectation that this patient will require hospital care that crosses at least 2 midnights to treat this condition based on the medical complexity of the problems presented.  Given the aforementioned information, the predictability of an adverse outcome is felt to be significant.    Lady Deutscher MD FACP [facility] Pager [phone]  How to contact the [facility] Attending or Consulting provider [place] or covering provider during after hours [place], for this patient?  1. Check the care team in [facility] and look for a) attending/consulting [facility] provider listed and b) the [facility] team listed 2. Log into [email or web address] and use [place]'s universal password to access. If you do not have the password, please contact the hospital operator. 3. Locate the [facility] provider you are looking for under [facility] and page to a number that you can be directly reached. 4. If you still have difficulty reaching the provider, please page the [facility] (Director on Call) for the Hospitalists listed on amion for assistance.  If 7PM-7AM, please  contact night-coverage [email or web address] Password [facility]  08/27/2018, 7:33 PM

## 2018-08-27 NOTE — ED Triage Notes (Signed)
To ED eval of worsening sob and abd pain. Seen at [facility] Friday and due for admit for cellulitis of abd and pleural effusion but left prior to getting inpt bed assigned. Pt states sob is worse today. Skin w/d, resp e/u. Pt placed on 2L [place] at front door by nurse first staff due to room air sat 84%. Sleeps with cpap and without complaints of difficulty sleeping.

## 2018-08-27 NOTE — Consult Note (Signed)
Cardiology Consultation:   Patient ID: [name]; [id number]; 1955-11-23   Admit date: 08/27/2018 Date of Consult: 08/27/2018  Primary Care Provider: Monico Blitz, MD Primary Cardiologist: Rozann Lesches, MD Primary Electrophysiologist:  None  Chief Complaint: dyspnea  Patient Profile:   [name] is a 63 y.o. female with a hx of CAD status post PCI in 2016 who presents with dyspnea and abdominal distention.  History of Present Illness:   The patient has had worsening dyspnea and abdominal distention since Tuesday.  She presented to the emergency room today for further evaluation.  Chest x-ray notable for worsening pulmonary edema and small right pleural effusion.  Physical exam notable for abdominal distention.  Labs notable for BNP 188 and troponin 61.  Patient was admitted to medicine for likely heart failure exacerbation.    Cardiology is consulted regarding elevated troponin.  Patient denies chest pain or pressure but does note some mild chest discomfort with inspiration (pleuritic).  She states she did not have chest pain with her prior PCI in 2016.  She does not weigh herself daily and takes 20 mg of Lasix per day.     Past Medical History:  Diagnosis Date  . Anxiety   . Aortic stenosis    a. Mild by 2D ECHO 11/17/14  . CAD (coronary artery disease)    a. 10/2014: Ant STEMI s/p DES to dLAD  . Carotid disease, bilateral ([place])    a. Duplex 12/2014: mild-mod atherosclerotic plaque without hemodynamically significant stenosis.  [name] COPD (chronic obstructive pulmonary disease) ([place])   . Essential hypertension   . HLD (hyperlipidemia)   . Morbid obesity ([place])   . Myocardial infarction ([place]) 11/17/2014  . PONV (postoperative nausea and vomiting)   . Shingles   . Sleep apnea    uses CPAP  . Type 2 diabetes mellitus ([place])     Past Surgical History:  Procedure Laterality Date  . ABDOMINAL HYSTERECTOMY    . BREAST BIOPSY Left    2016  . CARDIAC CATHETERIZATION  N/A 11/17/2014   Procedure: Left Heart Cath and Coronary Angiography;  Surgeon: Wellington Hampshire, MD;  Location: [facility];  Service: Cardiovascular;  Laterality: N/A;  . CARDIAC CATHETERIZATION N/A 11/17/2014   Procedure: Coronary Stent Intervention;  Surgeon: Wellington Hampshire, MD;  Location: [facility];  Service: Cardiovascular;  Laterality: N/A;  distal lad 2.25x20 promus  . CATARACT EXTRACTION W/PHACO Left 09/20/2016   Procedure: CATARACT EXTRACTION PHACO AND INTRAOCULAR LENS PLACEMENT (IOC);  Surgeon: Tonny Branch, MD;  Location: [facility];  Service: Ophthalmology;  Laterality: Left;  CDE: 9.27  . CATARACT EXTRACTION W/PHACO Right 10/18/2016   Procedure: CATARACT EXTRACTION PHACO AND INTRAOCULAR LENS PLACEMENT (IOC);  Surgeon: Tonny Branch, MD;  Location: [facility];  Service: Ophthalmology;  Laterality: Right;  CDE: 6.38  . CHOLECYSTECTOMY    . SPINAL FUSION     cervical; screws and plates.     Inpatient Medications: Scheduled Meds: . chlorhexidine  15 mL Mouth Rinse BID  . mouth rinse  15 mL Mouth Rinse q12n4p   Continuous Infusions: . vancomycin 2,500 mg (08/27/18 1716)   PRN Meds:   Home Meds: Prior to Admission medications   Medication Sig Start Date End Date Taking? Authorizing Provider  acetaminophen (TYLENOL) 500 MG tablet Take 500 mg by mouth daily as needed for moderate pain or headache.   Yes [provider]  albuterol (PROVENTIL HFA;VENTOLIN HFA) 108 (90 Base) MCG/ACT inhaler Inhale 2 puffs into  the lungs every 4 (four) hours as needed for wheezing or shortness of breath. 05/17/15  Yes Francine Graven, DO  albuterol (PROVENTIL) (2.5 MG/3ML) 0.083% nebulizer solution Take 3 mLs (2.5 mg total) by nebulization every 4 (four) hours as needed for wheezing or shortness of breath. 05/17/15  Yes Francine Graven, DO  aspirin EC 81 MG tablet Take 81 mg by mouth daily.   Yes [provider]  atorvastatin (LIPITOR) 10 MG tablet TAKE 1 TABLET (10 MG TOTAL)  BY MOUTH EVERY OTHER DAY. TAKE AT [facility] Patient taking differently: Take 10 mg by mouth See admin instructions. Take one tablet (10 mg) by mouth every other night with supper 10/05/17 09/25/18 Yes Satira Sark, MD  clindamycin (CLEOCIN) 300 MG capsule Take 2 capsules (600 mg total) by mouth 3 (three) times daily for 10 days. 08/25/18 09/04/18 Yes Zierle-Ghosh, Asia B, DO  dapagliflozin propanediol (FARXIGA) 10 MG TABS tablet Take 10 mg by mouth daily.   Yes [provider]  escitalopram (LEXAPRO) 20 MG tablet Take 20 mg by mouth 2 (two) times a day.   Yes [provider]  furosemide (LASIX) 20 MG tablet Take 2 tablets (40 mg total) by mouth daily for 5 days. Patient taking differently: Take 20-40 mg by mouth See admin instructions. Order 08/25/18: take 2 tablets (40 mg) by mouth daily for 5 days, then resume 1 tablet (20 mg) daily 08/25/18 08/30/18 Yes Zierle-Ghosh, Asia B, DO  insulin aspart (NOVOLOG FLEXPEN) 100 UNIT/ML FlexPen Inject 2-8 Units into the skin See admin instructions. Inject 2-8 units subcutaneously three times daily before meals if CBG is over 150 per sliding meals - 150-250 2 units, >250 8 units   Yes [provider]  Insulin Detemir (LEVEMIR FLEXTOUCH) 100 UNIT/ML Pen Inject 80 Units into the skin 2 (two) times daily.   Yes [provider]  lisinopril (ZESTRIL) 10 MG tablet TAKE 1 TABLET BY MOUTH EVERY DAY Patient taking differently: Take 10 mg by mouth daily.  06/26/18  Yes Satira Sark, MD  metFORMIN (GLUCOPHAGE) 1000 MG tablet Take 1,000 mg by mouth 2 (two) times daily.  09/26/14  Yes [provider]  metoprolol tartrate (LOPRESSOR) 25 MG tablet TAKE 1/2 TABLET BY MOUTH TWICE A DAY Patient taking differently: Take 12.5 mg by mouth 2 (two) times daily.  03/16/18  Yes Satira Sark, MD  Vitamin D, Ergocalciferol, (DRISDOL) 50000 units CAPS capsule Take 50,000 Units by mouth every Saturday. 08/30/16  Yes [provider]   hydrocortisone (ANUSOL-HC) 2.5 % rectal cream Apply 1 application topically 4 (four) times daily as needed for hemorrhoids. 08/25/18   Zierle-Ghosh, [place] B, DO  meclizine (ANTIVERT) 25 MG tablet Take 1 tablet (25 mg total) by mouth 3 (three) times daily as needed for dizziness. Patient not taking: Reported on 08/27/2018 08/12/17   Julianne Rice, MD  traMADol (ULTRAM) 50 MG tablet Take 1 tablet (50 mg total) by mouth every 6 (six) hours as needed. Patient not taking: Reported on 08/25/2018 04/06/17   Kem Parkinson, PA-C    Allergies:    Allergies  Allergen Reactions  . Codeine Nausea And Vomiting  . Sulfa Antibiotics Rash    Social History:   Social History   Socioeconomic History  . Marital status: Widowed    Spouse name: Not on file  . Number of children: Not on file  . Years of education: Not on file  . Highest education level: Not on file  Occupational History  . Occupation:  Office work for [employer]  . Financial resource strain: Not on file  . Food insecurity    Worry: Not on file    Inability: Not on file  . Transportation needs    Medical: Not on file    Non-medical: Not on file  Tobacco Use  . Smoking status: Current Some Day Smoker    Packs/day: 0.25    Years: 30.00    Pack years: 7.50    Types: Cigarettes  . Smokeless tobacco: Never Used  . Tobacco comment: 3 cigs per day  Substance and Sexual Activity  . Alcohol use: No    Alcohol/week: 0.0 standard drinks  . Drug use: No  . Sexual activity: Yes    Partners: Male    Birth control/protection: Surgical  Lifestyle  . Physical activity    Days per week: Not on file    Minutes per session: Not on file  . Stress: Not on file  Relationships  . Social Herbalist on phone: Not on file    Gets together: Not on file    Attends religious service: Not on file    Active member of club or organization: Not on file    Attends meetings of clubs or organizations: Not on file     Relationship status: Not on file  . Intimate partner violence    Fear of current or ex partner: Not on file    Emotionally abused: Not on file    Physically abused: Not on file    Forced sexual activity: Not on file  Other Topics Concern  . Not on file  Social History Narrative  . Not on file     Family History:    Family History  Problem Relation Age of Onset  . Heart attack Father   . Heart disease Father   . Cancer Brother       ROS:  Please see the history of present illness.  All other ROS reviewed and negative.     Physical Exam/Data:   Vitals:   08/27/18 1615 08/27/18 1630 08/27/18 1700 08/27/18 1742  BP:    (!) 146/72  Pulse: 66 65 66 68  Resp: (!) 29 15 (!) 26 19  Temp:    98.5 F (36.9 C)  TempSrc:    Oral  SpO2: 100% 99% 100% 99%  Weight:    117.8 kg  Height:    5\' 4"  (1.626 m)    Intake/Output Summary (Last 24 hours) at 08/27/2018 1832 Last data filed at 08/27/2018 1800 Gross per 24 hour  Intake 457.23 ml  Output -  Net 457.23 ml   Last 3 Weights 08/27/2018 08/27/2018 08/25/2018  Weight (lbs) 259 lb 11.2 oz 270 lb 15.1 oz 270 lb 15.1 oz  Weight (kg) 117.8 kg 122.9 kg 122.9 kg     Body mass index is 44.58 kg/m.  General: Well developed, well nourished, in no acute distress. Head: Normocephalic, atraumatic, sclera non-icteric, no xanthomas, nares are without discharge.  Neck: Negative for carotid bruits. JVD not visible. Lungs: Clear bilaterally to auscultation without wheezes, rales, or rhonchi. Breathing is unlabored. Heart: RRR with S1 S2.  Abdomen: Distended, nontender Msk:  Strength and tone appear normal for age. Extremities: No clubbing or cyanosis.  Neuro: Alert and oriented X 3. No facial asymmetry. No focal deficit. Moves all extremities spontaneously. Psych:  Responds to questions appropriately with a normal affect.   EKG:  The EKG was personally reviewed and  demonstrates:  Sinus rhythm, no evidence of ischemia  Relevant CV Studies:  Echo 05/2015 Impressions:  - Normal LV wall thickness with LVEF 60-65%. Grossly normal   diastolic function with intermediate LV filling pressure. Trivial   mitral regurgitation. Mild aortic stenosis as outlined above.   Trivial tricuspid regurgitation with mildly elevated PASP 41   mmHg.  LHC 10/2014  Prox Cx to Mid Cx lesion, 20% stenosed.  Mid Cx lesion, 30% stenosed.  Dist LAD lesion, 100% stenosed. Post intervention, there is a 0% residual stenosis.  Mid RCA lesion, 30% stenosed.  The left ventricular systolic function is normal.    1. Severe one-vessel coronary artery disease with an occluded distal LAD.  2. Normal LV systolic function with apical akinesis.  3. Successful angioplasty and drug-eluting stent placement to the distal LAD.   Recommendations:  Dual antiplatelet therapy for at least one year. Aggressive treatment of risk factors. Smoking cessation is strongly advised.   Laboratory Data:  High Sensitivity Troponin:   Recent Labs  Lab 08/25/18 1116 08/27/18 1350  TROPONINIHS 5 61*     Cardiac EnzymesNo results for input(s): TROPONINI in the last 168 hours. No results for input(s): TROPIPOC in the last 168 hours.  Chemistry Recent Labs  Lab 08/25/18 1135 08/27/18 1350  NA 139 139  K 3.7 3.3*  CL 101 100  CO2 27 27  GLUCOSE 145* 175*  BUN 18 13  CREATININE 0.85 0.96  CALCIUM 8.2* 8.5*  GFRNONAA >60 >60  GFRAA >60 >60  ANIONGAP 11 12    Recent Labs  Lab 08/25/18 1135  PROT 7.9  ALBUMIN 3.2*  AST 34  ALT 32  ALKPHOS 77  BILITOT 1.0   Hematology Recent Labs  Lab 08/25/18 1116 08/27/18 1350  WBC 5.0 5.4  RBC 4.70 4.70  HGB 13.7 13.9  HCT 45.1 45.1  MCV 96.0 96.0  MCH 29.1 29.6  MCHC 30.4 30.8  RDW 16.1* 15.9*  PLT 252 247   BNP Recent Labs  Lab 08/25/18 1116 08/27/18 1350  BNP 130.0* 188.3*    DDimer No results for input(s): DDIMER in the last 168 hours.   Radiology/Studies:  Dg Chest 2 View  Result Date: 08/27/2018  CLINICAL DATA:  To ED eval of worsening sob and abd pain. Seen at [facility] Friday and due for admit for cellulitis of abd and pleural effusion but left prior to getting inpt bed assigned. Pt states sob is worse today. O2 stats 84% on RA. Hx MI, COPD, CAD, diabetes EXAM: CHEST - 2 VIEW COMPARISON:  07/19/2018 FINDINGS: Slight increase in bilateral interstitial opacities. Small right pleural effusion now evident, with adjacent atelectasis/consolidation at the right lung base. Heart size upper limits normal. No pneumothorax. Cervical fixation hardware as before. IMPRESSION: Worsening interstitial edema/infiltrates and small right pleural effusion. Electronically Signed   By: Lucrezia Europe M.D.   On: 08/27/2018 14:55   Ct Abdomen Pelvis W Contrast  Result Date: 08/25/2018 CLINICAL DATA:  Abdominal pain EXAM: CT ABDOMEN AND PELVIS WITH CONTRAST TECHNIQUE: Multidetector CT imaging of the abdomen and pelvis was performed using the standard protocol following bolus administration of intravenous contrast. CONTRAST:  17mL OMNIPAQUE IOHEXOL 300 MG/ML  SOLN COMPARISON:  Jun 11, 2014 FINDINGS: Lower chest: There is a moderate free-flowing pleural effusion on the right. Lung bases otherwise appear clear. Hepatobiliary: Liver measures 21.0 cm in length. There is diffuse hepatic steatosis. No focal liver lesions are evident. Gallbladder is absent. There is no appreciable biliary duct dilatation.  Pancreas: There is no pancreatic mass or inflammatory focus. Spleen: No splenic lesions are evident. Adrenals/Urinary Tract: Right adrenal appears normal. Left adrenal hypertrophy appear stable compared to the 2016 study. There is no appreciable renal mass or hydronephrosis on either side. There is no evident renal or ureteral calculus on either side. Urinary bladder is essentially decompressed at this time. There is no urinary bladder wall thickening demonstrable. Stomach/Bowel: There is no appreciable bowel wall or mesenteric  thickening. There is no evident bowel obstruction. Terminal ileum appears normal. There is mild lipomatous infiltration of the ileocecal valve. There is no free air or portal venous air. Vascular/Lymphatic: There are foci of aortic and iliac artery atherosclerosis. No aneurysm evident. No adenopathy is appreciable in the abdomen or pelvis. Reproductive: Uterus absent.  No pelvic mass evident. Other: Appendix unremarkable without periappendiceal region inflammation. There is no evident abscess in the abdomen or pelvis. There is fairly mild ascites in the dependent portion of the pelvis. There is slight soft tissue thickening in the mesentery. There is more extensive and diffuse soft tissue stranding throughout the abdominal wall consistent with a degree of anasarca. There is diffuse thickening along the anterior abdominal wall on both right and left sides, slightly more on the right than on the left, which may represent a degree of cellulitis. No fluid collection or abscess noted in the abdominal wall regions. Musculoskeletal: There is degenerative change in the lumbar spine. There are no blastic or lytic bone lesions. No intramuscular lesions are evident. IMPRESSION: 1. Suspect anasarca with edema in the abdominal and pelvic wall regions as well as a much lesser degree of soft tissue thickening in the abdominal mesentery. Question cellulitis along the right and left anterior abdominal and pelvic walls. No abdominal wall fluid collections. 2. Incomplete visualization of moderate free-flowing right pleural effusion. There is fairly mild ascites in the pelvis. 3. No demonstrable bowel obstruction. No abscess in the abdomen or pelvis. Appendiceal region appears normal. 4. No evident renal or ureteral calculus. No hydronephrosis on either side. 5. Hepatic steatosis. Liver is prominent, measuring 21.0 cm in length. No focal liver lesions evident. 6.  Gallbladder absent.  Uterus absent. 7.  Aortic Atherosclerosis  (ICD10-I70.0). Electronically Signed   By: Lowella Grip III M.D.   On: 08/25/2018 14:13   Dg Abd Acute 2+v W 1v Chest  Result Date: 08/25/2018 CLINICAL DATA:  Abdominal pain, short of breath EXAM: DG ABDOMEN ACUTE W/ 1V CHEST COMPARISON:  None. FINDINGS: Anterior cervical fusion noted. Normal cardiac silhouette. There is central venous congestion. Small RIGHT effusion. No focal consolidation. No dilated large or small bowel. Cholecystectomy clips noted. Gas in the rectum. No organomegaly. No pathologic calcifications. IMPRESSION: 1. Central venous congestion and RIGHT pleural effusion suggest volume overload. 2. No bowel obstruction. Electronically Signed   By: Suzy Bouchard M.D.   On: 08/25/2018 12:46   US Abdomen Limited Ruq  Result Date: 08/25/2018 CLINICAL DATA:  Hepatic steatosis EXAM: ULTRASOUND ABDOMEN LIMITED RIGHT UPPER QUADRANT COMPARISON:  CT abdomen and pelvis August 25, 2018. FINDINGS: Gallbladder: Surgically absent. Common bile duct: Diameter: 5 mm. No intrahepatic or extrahepatic biliary duct dilatation. Liver: No focal lesion identified. Liver echogenicity is increased diffusely. Portal vein is patent on color Doppler imaging with normal direction of blood flow towards the liver. Other: Right pleural effusion noted. No ascites is demonstrated on this study. IMPRESSION: 1. Marked increase in liver echogenicity, a finding indicative of hepatic steatosis. While no focal liver lesions are evident on this  study, it must be cautioned that the sensitivity of ultrasound for detection of focal liver lesions is diminished in this circumstance. 2.  Gallbladder absent. 3.  Right pleural effusion. 4.  No ascites demonstrable by ultrasound. Electronically Signed   By: Lowella Grip III M.D.   On: 08/25/2018 16:50    Assessment and Plan:   1. Acute diastolic heart failure Agree with IV diuresis for volume overload likely due to diastolic dysfunction.  Agree with repeat echo to ensure LV  function has not changed since 2016.  2.  Elevated troponin High-sensitivity troponin 61.  She has some mild pleuritic chest discomfort but no symptoms consistent with angina.  ECG shows no evidence of ischemia (mild T wave inversions less than 1 mm).  Would repeat troponin to get delta value, but my suspicion for acute type I MI is low.  Cardiology will continue to follow.  For questions or updates, please contact [place] Please consult [email or web address] for contact info under     Signed, Chriss Czar, MD  08/27/2018 6:32 PM

## 2018-08-27 NOTE — ED Provider Notes (Addendum)
[place] EMERGENCY DEPARTMENT Provider Note   CSN: [id number] Arrival date & time: 08/27/18  1306    History   Chief Complaint Chief Complaint  Patient presents with  . Shortness of Breath  . Abdominal Pain    HPI [name] is a 63 y.o. female.     HPI   [name] is a 63 y.o. female, with a history of aortic stenosis, CAD, COPD, MI, morbid obesity, HTN, DM, presenting to the ED with worsening shortness of breath since around July 28. She does not wear oxygen at home, however, in triage was noted to have room air SPO2 84%. She has developed some chest tightness over the last couple days that she attributes to her breathing difficulties.  The sensation is intermittent and seems to only arise when she begins to breathe more deeply.  Also accompanied by lower extremity edema that arose with her lower extremity cellulitis and has improved over the last week.  Also starting around July 28 she has noted lower abdominal redness and pain to the abdominal skin in the region. She was recently treated for lower extremity cellulitis, on doxycycline for 2 weeks, last dose July 23.  She states the region of redness on the abdomen reminds her of cellulitis.  Patient was seen in the ED at [facility] on July 31st.  She was recommended for admission, became anxious about being admitted while awaiting inpatient bed, and left AMA.  She was sent home on doxycycline.  She states she is more mentally prepared for admission today. She previously had diarrhea when she was seen July 31, but this has since resolved.  Denies fever/chills, N/V/D, deep abdominal pain, cough, urinary symptoms, or any other complaints.  Cardiologist: Dr. Domenic Polite  Past Medical History:  Diagnosis Date  . Anxiety   . Aortic stenosis    a. Mild by 2D ECHO 11/17/14  . CAD (coronary artery disease)    a. 10/2014: Ant STEMI s/p DES to dLAD  . Carotid disease, bilateral ([place])    a. Duplex  12/2014: mild-mod atherosclerotic plaque without hemodynamically significant stenosis.  [name] COPD (chronic obstructive pulmonary disease) ([place])   . Essential hypertension   . HLD (hyperlipidemia)   . Morbid obesity ([place])   . Myocardial infarction ([place]) 11/17/2014  . PONV (postoperative nausea and vomiting)   . Shingles   . Sleep apnea    uses CPAP  . Type 2 diabetes mellitus [facility])     Patient Active Problem List   Diagnosis Date Noted  . Acute diastolic CHF (congestive heart failure) ([place]) 08/27/2018  . Abdominal wall cellulitis 08/25/2018  . Anasarca 08/25/2018  . Hypoxia 08/25/2018  . Backache 08/25/2018  . Anxiety 08/25/2018  . Morbid obesity ([place]) 01/14/2015  . CAD (coronary artery disease)   . COPD (chronic obstructive pulmonary disease) ([place])   . Hypertension   . DM type 2 causing vascular disease ([place])   . Current smoker   . HLD (hyperlipidemia)   . Aortic stenosis   . ST elevation (STEMI) myocardial infarction involving left anterior descending coronary artery ([place]) 11/17/2014    Past Surgical History:  Procedure Laterality Date  . ABDOMINAL HYSTERECTOMY    . BREAST BIOPSY Left    2016  . CARDIAC CATHETERIZATION N/A 11/17/2014   Procedure: Left Heart Cath and Coronary Angiography;  Surgeon: Wellington Hampshire, MD;  Location: [place] CV LAB;  Service: Cardiovascular;  Laterality: N/A;  . CARDIAC CATHETERIZATION N/A 11/17/2014  Procedure: Coronary Stent Intervention;  Surgeon: Wellington Hampshire, MD;  Location: [place] CV LAB;  Service: Cardiovascular;  Laterality: N/A;  distal lad 2.25x20 promus  . CATARACT EXTRACTION W/PHACO Left 09/20/2016   Procedure: CATARACT EXTRACTION PHACO AND INTRAOCULAR LENS PLACEMENT (IOC);  Surgeon: Tonny Branch, MD;  Location: [facility];  Service: Ophthalmology;  Laterality: Left;  CDE: 9.27  . CATARACT EXTRACTION W/PHACO Right 10/18/2016   Procedure: CATARACT EXTRACTION PHACO AND INTRAOCULAR LENS PLACEMENT (IOC);  Surgeon: Tonny Branch, MD;   Location: [facility] OR[facility];  Service: Ophthalmology;  Laterality: Right;  CDE: 6.38  . CHOLECYSTECTOMY    . SPINAL FUSION     cervical; screws and plates.     OB History    Gravida  1   Para  1   Term  1   Preterm      AB      Living  1     SAB      TAB      Ectopic      Multiple      Live Births               Home Medications    Prior to Admission medications   Medication Sig Start Date End Date Taking? Authorizing Provider  acetaminophen (TYLENOL) 500 MG tablet Take 500 mg by mouth daily as needed for moderate pain or headache.   Yes [provider]  albuterol (PROVENTIL HFA;VENTOLIN HFA) 108 (90 Base) MCG/ACT inhaler Inhale 2 puffs into the lungs every 4 (four) hours as needed for wheezing or shortness of breath. 05/17/15  Yes Francine Graven, DO  albuterol (PROVENTIL) (2.5 MG/3ML) 0.083% nebulizer solution Take 3 mLs (2.5 mg total) by nebulization every 4 (four) hours as needed for wheezing or shortness of breath. 05/17/15  Yes Francine Graven, DO  aspirin EC 81 MG tablet Take 81 mg by mouth daily.   Yes [provider]  atorvastatin (LIPITOR) 10 MG tablet TAKE 1 TABLET (10 MG TOTAL) BY MOUTH EVERY OTHER DAY. TAKE AT [facility] Patient taking differently: Take 10 mg by mouth See admin instructions. Take one tablet (10 mg) by mouth every other night with supper 10/05/17 09/25/18 Yes Satira Sark, MD  clindamycin (CLEOCIN) 300 MG capsule Take 2 capsules (600 mg total) by mouth 3 (three) times daily for 10 days. 08/25/18 09/04/18 Yes Zierle-Ghosh, Asia B, DO  dapagliflozin propanediol (FARXIGA) 10 MG TABS tablet Take 10 mg by mouth daily.   Yes [provider]  escitalopram (LEXAPRO) 20 MG tablet Take 20 mg by mouth 2 (two) times a day.   Yes [provider]  furosemide (LASIX) 20 MG tablet Take 2 tablets (40 mg total) by mouth daily for 5 days. Patient taking differently: Take 20-40 mg by mouth See admin instructions. Order 08/25/18: take 2  tablets (40 mg) by mouth daily for 5 days, then resume 1 tablet (20 mg) daily 08/25/18 08/30/18 Yes Zierle-Ghosh, Asia B, DO  insulin aspart (NOVOLOG FLEXPEN) 100 UNIT/ML FlexPen Inject 2-8 Units into the skin See admin instructions. Inject 2-8 units subcutaneously three times daily before meals if CBG is over 150 per sliding meals - 150-250 2 units, >250 8 units   Yes [provider]  Insulin Detemir (LEVEMIR FLEXTOUCH) 100 UNIT/ML Pen Inject 80 Units into the skin 2 (two) times daily.   Yes [provider]  lisinopril (ZESTRIL) 10 MG tablet TAKE 1 TABLET BY MOUTH EVERY DAY Patient taking differently: Take 10 mg by mouth  daily.  06/26/18  Yes Satira Sark, MD  metFORMIN (GLUCOPHAGE) 1000 MG tablet Take 1,000 mg by mouth 2 (two) times daily.  09/26/14  Yes [provider]  metoprolol tartrate (LOPRESSOR) 25 MG tablet TAKE 1/2 TABLET BY MOUTH TWICE A DAY Patient taking differently: Take 12.5 mg by mouth 2 (two) times daily.  03/16/18  Yes Satira Sark, MD  Vitamin D, Ergocalciferol, (DRISDOL) 50000 units CAPS capsule Take 50,000 Units by mouth every Saturday. 08/30/16  Yes [provider]  hydrocortisone (ANUSOL-HC) 2.5 % rectal cream Apply 1 application topically 4 (four) times daily as needed for hemorrhoids. 08/25/18   Zierle-Ghosh, [place] B, DO  meclizine (ANTIVERT) 25 MG tablet Take 1 tablet (25 mg total) by mouth 3 (three) times daily as needed for dizziness. Patient not taking: Reported on 08/27/2018 08/12/17   Julianne Rice, MD  traMADol (ULTRAM) 50 MG tablet Take 1 tablet (50 mg total) by mouth every 6 (six) hours as needed. Patient not taking: Reported on 08/25/2018 04/06/17   Kem Parkinson, PA-C    Family History Family History  Problem Relation Age of Onset  . Heart attack Father   . Heart disease Father   . Cancer Brother     Social History Social History   Tobacco Use  . Smoking status: Current Some Day Smoker    Packs/day: 0.25    Years:  30.00    Pack years: 7.50    Types: Cigarettes  . Smokeless tobacco: Never Used  . Tobacco comment: 3 cigs per day  Substance Use Topics  . Alcohol use: No    Alcohol/week: 0.0 standard drinks  . Drug use: No     Allergies   Codeine and Sulfa antibiotics   Review of Systems Review of Systems  Constitutional: Negative for chills, diaphoresis and fever.  Respiratory: Positive for shortness of breath. Negative for cough.   Cardiovascular: Positive for chest pain and leg swelling.  Gastrointestinal: Negative for abdominal pain, diarrhea, nausea and vomiting.  Skin: Positive for color change.  Neurological: Negative for syncope.  All other systems reviewed and are negative.    Physical Exam Updated Vital Signs BP (!) 139/54 (BP Location: Right Arm)   Pulse 76   Temp 98.4 F (36.9 C) (Oral)   Resp 20   Ht 5' 4.02" (1.626 m)   Wt 122.9 kg   SpO2 97%   BMI 46.48 kg/m   Physical Exam Vitals signs and nursing note reviewed.  Constitutional:      General: She is not in acute distress.    Appearance: She is well-developed. She is not diaphoretic.  HENT:     Head: Normocephalic and atraumatic.     Mouth/Throat:     Mouth: Mucous membranes are moist.     Pharynx: Oropharynx is clear.  Eyes:     Conjunctiva/sclera: Conjunctivae normal.  Neck:     Musculoskeletal: Neck supple.  Cardiovascular:     Rate and Rhythm: Normal rate and regular rhythm.     Pulses: Normal pulses.          Radial pulses are 2+ on the right side and 2+ on the left side.       Posterior tibial pulses are 2+ on the right side and 2+ on the left side.     Heart sounds: Normal heart sounds.     Comments: Tactile temperature in the extremities appropriate and equal bilaterally. Pulmonary:     Breath sounds: Decreased breath sounds present.  Comments: Room air SPO2 reportedly 84%.  Maintaining 100% on 2 L supplemental O2. Abdominal:     Palpations: Abdomen is soft.     Tenderness: There is no  abdominal tenderness. There is no guarding.     Comments: Patient does have some erythema to the skin of the lower abdomen with some associated superficial tenderness. No deeper tenderness to the abdomen.  Musculoskeletal:     Right lower leg: Pitting Edema present.     Left lower leg: Pitting Edema present.  Lymphadenopathy:     Cervical: No cervical adenopathy.  Skin:    General: Skin is warm and dry.  Neurological:     Mental Status: She is alert.  Psychiatric:        Mood and Affect: Mood and affect normal.        Speech: Speech normal.        Behavior: Behavior normal.      ED Treatments / Results  Labs (all labs ordered are listed, but only abnormal results are displayed) Labs Reviewed  BASIC METABOLIC PANEL - Abnormal; Notable for the following components:      Result Value   Potassium 3.3 (*)    Glucose, Bld 175 (*)    Calcium 8.5 (*)    All other components within normal limits  CBC - Abnormal; Notable for the following components:   RDW 15.9 (*)    All other components within normal limits  BRAIN NATRIURETIC PEPTIDE - Abnormal; Notable for the following components:   B Natriuretic Peptide 188.3 (*)    All other components within normal limits  TROPONIN I (HIGH SENSITIVITY) - Abnormal; Notable for the following components:   Troponin I (High Sensitivity) 61 (*)    All other components within normal limits  SARS CORONAVIRUS 2 (HOSPITAL ORDER, [place] LAB)  LACTIC ACID, PLASMA  LACTIC ACID, PLASMA  TROPONIN I (HIGH SENSITIVITY)    EKG EKG Interpretation  Date/Time:  Sunday August 27 2018 13:57:00 EDT Ventricular Rate:  70 PR Interval:  140 QRS Duration: 66 QT Interval:  386 QTC Calculation: 416 R Axis:   108 Text Interpretation:  Normal sinus rhythm Rightward axis Low voltage QRS Septal infarct , age undetermined Abnormal ECG No significant change since last tracing Confirmed by Fredia Sorrow [phone] on 08/27/2018 5:27:21 PM    Radiology Dg Chest 2 View  Result Date: 08/27/2018 CLINICAL DATA:  To ED eval of worsening sob and abd pain. Seen at [facility] Friday and due for admit for cellulitis of abd and pleural effusion but left prior to getting inpt bed assigned. Pt states sob is worse today. O2 stats 84% on RA. Hx MI, COPD, CAD, diabetes EXAM: CHEST - 2 VIEW COMPARISON:  07/19/2018 FINDINGS: Slight increase in bilateral interstitial opacities. Small right pleural effusion now evident, with adjacent atelectasis/consolidation at the right lung base. Heart size upper limits normal. No pneumothorax. Cervical fixation hardware as before. IMPRESSION: Worsening interstitial edema/infiltrates and small right pleural effusion. Electronically Signed   By: Lucrezia Europe M.D.   On: 08/27/2018 14:55    Procedures .Critical Care Performed by: Lorayne Bender, PA-C Authorized by: Lorayne Bender, PA-C   Critical care provider statement:    Critical care time (minutes):  35   Critical care time was exclusive of:  Separately billable procedures and treating other patients   Critical care was necessary to treat or prevent imminent or life-threatening deterioration of the following conditions:  Cardiac failure  Critical care was time spent personally by me on the following activities:  Development of treatment plan with patient or surrogate, discussions with consultants, evaluation of patient's response to treatment, examination of patient, obtaining history from patient or surrogate, ordering and performing treatments and interventions, ordering and review of laboratory studies, ordering and review of radiographic studies, pulse oximetry, re-evaluation of patient's condition and review of old charts   I assumed direction of critical care for this patient from another provider in my specialty: no     (including critical care time)  Medications Ordered in ED Medications  azithromycin (ZITHROMAX) 500 mg in sodium chloride 0.9 % 250 mL IVPB (500  mg Intravenous Transfusing/Transfer 08/27/18 1724)  vancomycin (VANCOCIN) 2,500 mg in sodium chloride 0.9 % 500 mL IVPB (2,500 mg Intravenous Transfusing/Transfer 08/27/18 1725)  sodium chloride flush (NS) 0.9 % injection 3 mL (3 mLs Intravenous Given 08/27/18 1619)  furosemide (LASIX) injection 40 mg (40 mg Intravenous Given 08/27/18 1600)  cefTRIAXone (ROCEPHIN) 1 g in sodium chloride 0.9 % 100 mL IVPB (0 g Intravenous Stopped 08/27/18 1659)  hydrOXYzine (ATARAX/VISTARIL) tablet 25 mg (25 mg Oral Given 08/27/18 1604)  albuterol (VENTOLIN HFA) 108 (90 Base) MCG/ACT inhaler 4 puff (4 puffs Inhalation Given 08/27/18 1615)     Initial Impression / Assessment and Plan / ED Course  I have reviewed the triage vital signs and the nursing notes.  Pertinent labs & imaging results that were available during my care of the patient were reviewed by me and considered in my medical decision making (see chart for details).  Clinical Course as of Aug 27 1726  Sun Aug 27, 2018  1600 Spoke with Dr. Evangeline Gula, hospitalist. Agrees to admit the patient.   [SJ]    Clinical Course User Index [SJ] Ronald Londo C, PA-C       Patient presents with primarily shortness of breath.  She was hypoxic upon arrival down to 84% on room air, requiring new supplemental O2. She has what appears to be worsening bilateral lower opacities with right pleural effusion on chest x-ray today.  This type of finding could be found with bilateral pneumonia, however, I more suspect this to be a finding consistent with acute CHF as the patient has no leukocytosis and is afebrile.  No lactic acidosis.  However, as a precaution, she was given a dose of antibiotics with coverage for CAP. There is some mild suspicion that patient may have some cellulitis to the lower abdomen and was therefore started on antibiotic therapy.  Patient admitted for further management.  Patient's elevated high-sensitivity troponin of 61 resulted after patient's admission.   Suspect this is due to increased demand from CHF.  No evidence of ischemia on EKG.   Findings and plan of care discussed with Fredia Sorrow, MD.   Vitals:   08/27/18 1600 08/27/18 1615 08/27/18 1630 08/27/18 1700  BP:      Pulse: 65 66 65 66  Resp: (!) 21 (!) 29 15 (!) 26  Temp:      TempSrc:      SpO2: 100% 100% 99% 100%  Weight:      Height:         Final Clinical Impressions(s) / ED Diagnoses   Final diagnoses:  Acute congestive heart failure, unspecified heart failure type [facility])    ED Discharge Orders    None       Layla Maw 08/27/18 1728    Fredia Sorrow, MD 08/30/18 [phone]  Lorayne Bender, PA-C 09/10/18 1213    Fredia Sorrow, MD 09/10/18 1545

## 2018-08-28 ENCOUNTER — Inpatient Hospital Stay (HOSPITAL_COMMUNITY): Payer: BC Managed Care – PPO

## 2018-08-28 DIAGNOSIS — I5022 Chronic systolic (congestive) heart failure: Secondary | ICD-10-CM

## 2018-08-28 DIAGNOSIS — I5031 Acute diastolic (congestive) heart failure: Secondary | ICD-10-CM

## 2018-08-28 LAB — BASIC METABOLIC PANEL
Anion gap: 11 (ref 5–15)
BUN: 14 mg/dL (ref 8–23)
CO2: 30 mmol/L (ref 22–32)
Calcium: 8.3 mg/dL — ABNORMAL LOW (ref 8.9–10.3)
Chloride: 99 mmol/L (ref 98–111)
Creatinine, Ser: 1.05 mg/dL — ABNORMAL HIGH (ref 0.44–1.00)
GFR calc Af Amer: >60 mL/min (ref >60)
GFR calc non Af Amer: 57 mL/min — ABNORMAL LOW (ref >60)
Glucose, Bld: 77 mg/dL (ref 70–99)
Potassium: 3.3 mmol/L — ABNORMAL LOW (ref 3.5–5.1)
Sodium: 140 mmol/L (ref 135–145)

## 2018-08-28 LAB — MAGNESIUM: Magnesium: 1.7 mg/dL (ref 1.7–2.4)

## 2018-08-28 LAB — GLUCOSE, CAPILLARY
Glucose-Capillary: 73 mg/dL (ref 70–99)
Glucose-Capillary: 62 mg/dL — ABNORMAL LOW (ref 70–99)
Glucose-Capillary: 101 mg/dL — ABNORMAL HIGH (ref 70–99)
Glucose-Capillary: 74 mg/dL (ref 70–99)
Glucose-Capillary: 105 mg/dL — ABNORMAL HIGH (ref 70–99)

## 2018-08-28 LAB — ECHOCARDIOGRAM COMPLETE
Height: 64 in
Weight: 3804.8 oz

## 2018-08-28 LAB — TROPONIN I (HIGH SENSITIVITY): Troponin I (High Sensitivity): 62 ng/L — ABNORMAL HIGH (ref <18)

## 2018-08-28 NOTE — Progress Notes (Signed)
BG 73, pt is asymtomatic, juice and snack given, and give instruction sign hypoglycemia, verbalized understanding.

## 2018-08-28 NOTE — Progress Notes (Signed)
Patient to self-administer CPAP when ready for sleep using hospital provided machine and her tubing and nasal mask from home.  12 cmH20.  Patient is familiar with equipment and procedure.  2L 02 bleed in.

## 2018-08-28 NOTE — Progress Notes (Signed)
  Echocardiogram 2D Echocardiogram has been performed.  [name] 08/28/2018, 8:49 AM

## 2018-08-28 NOTE — Plan of Care (Signed)
  Problem: Education: Goal: Knowledge of General Education information will improve Description: Including pain rating scale, medication(s)/side effects and non-pharmacologic comfort measures Outcome: Progressing   Problem: Health Behavior/Discharge Planning: Goal: Ability to manage health-related needs will improve Outcome: Progressing   Problem: Clinical Measurements: Goal: Ability to maintain clinical measurements within normal limits will improve Outcome: Progressing Goal: Will remain free from infection Outcome: Progressing Goal: Diagnostic test results will improve Outcome: Progressing Goal: Respiratory complications will improve Outcome: Progressing Goal: Cardiovascular complication will be avoided Outcome: Progressing   Problem: Activity: Goal: Risk for activity intolerance will decrease Outcome: Progressing   Problem: Nutrition: Goal: Adequate nutrition will be maintained Outcome: Progressing   Problem: Coping: Goal: Level of anxiety will decrease Outcome: Progressing   Problem: Elimination: Goal: Will not experience complications related to bowel motility Outcome: Progressing Goal: Will not experience complications related to urinary retention Outcome: Progressing   Problem: Pain Managment: Goal: General experience of comfort will improve Outcome: Progressing   Problem: Safety: Goal: Ability to remain free from injury will improve Outcome: Progressing   Problem: Skin Integrity: Goal: Risk for impaired skin integrity will decrease Outcome: Progressing   Problem: Education: Goal: Ability to demonstrate management of disease process will improve Outcome: Progressing Goal: Ability to verbalize understanding of medication therapies will improve Outcome: Progressing Goal: Individualized Educational Video(s) Outcome: Progressing   Problem: Activity: Goal: Capacity to carry out activities will improve Outcome: Progressing   Problem: Cardiac: Goal:  Ability to achieve and maintain adequate cardiopulmonary perfusion will improve Outcome: Progressing   Problem: Education: Goal: Ability to demonstrate management of disease process will improve Outcome: Progressing Goal: Ability to verbalize understanding of medication therapies will improve Outcome: Progressing Goal: Individualized Educational Video(s) Outcome: Progressing   Problem: Activity: Goal: Capacity to carry out activities will improve Outcome: Progressing   Problem: Cardiac: Goal: Ability to achieve and maintain adequate cardiopulmonary perfusion will improve Outcome: Progressing   Problem: Education: Goal: Ability to demonstrate management of disease process will improve Outcome: Progressing Goal: Ability to verbalize understanding of medication therapies will improve Outcome: Progressing Goal: Individualized Educational Video(s) Outcome: Progressing   Problem: Activity: Goal: Capacity to carry out activities will improve Outcome: Progressing   Problem: Cardiac: Goal: Ability to achieve and maintain adequate cardiopulmonary perfusion will improve Outcome: Progressing   

## 2018-08-28 NOTE — Progress Notes (Signed)
Patient ID: [name], female   DOB: 1955/08/29, 63 y.o.   MRN: [id number]     Subjective:   Less dyspnea  Objective:  Vitals:   08/27/18 2003 08/27/18 2218 08/27/18 2355 08/28/18 0507  BP: (!) 136/54 (!) 144/62  121/60  Pulse: 65 64 65 (!) 55  Resp: 18  18 18   Temp: 98.3 F (36.8 C)   98.4 F (36.9 C)  TempSrc: Oral   Oral  SpO2: 99%  96% 98%  Weight:    107.9 kg  Height:        Intake/Output from previous day:  Intake/Output Summary (Last 24 hours) at 08/28/2018 0827 Last data filed at 08/28/2018 9323 Gross per 24 hour  Intake 937.23 ml  Output 450 ml  Net 487.23 ml    Physical Exam:  Obese white female OSA Lungs basilar atelectasis Trace LE edema BS positive Getting 2 D currently  Lab Results: Basic Metabolic Panel: Recent Labs    08/25/18 1135 08/27/18 1350 08/27/18 2046  NA 139 139  --   K 3.7 3.3*  --   CL 101 100  --   CO2 27 27  --   GLUCOSE 145* 175*  --   BUN 18 13  --   CREATININE 0.85 0.96 1.00  CALCIUM 8.2* 8.5*  --    Liver Function Tests: Recent Labs    08/25/18 1135  AST 34  ALT 32  ALKPHOS 77  BILITOT 1.0  PROT 7.9  ALBUMIN 3.2*   Recent Labs    08/25/18 1135  LIPASE 25   CBC: Recent Labs    08/25/18 1116 08/27/18 1350 08/27/18 2046  WBC 5.0 5.4 5.4  NEUTROABS 3.2  --   --   HGB 13.7 13.9 13.1  HCT 45.1 45.1 41.8  MCV 96.0 96.0 94.8  PLT 252 247 227   Cardiac Enzymes: No results for input(s): CKTOTAL, CKMB, CKMBINDEX, TROPONINI in the last 72 hours. BNP: Invalid input(s): POCBNP D-Dimer: No results for input(s): DDIMER in the last 72 hours. Hemoglobin A1C: Recent Labs    08/25/18 1116  HGBA1C 8.4*    Imaging: Dg Chest 2 View  Result Date: 08/27/2018 CLINICAL DATA:  To ED eval of worsening sob and abd pain. Seen at [facility] Friday and due for admit for cellulitis of abd and pleural effusion but left prior to getting inpt bed assigned. Pt states sob is worse today. O2 stats 84% on RA. Hx MI, COPD,  CAD, diabetes EXAM: CHEST - 2 VIEW COMPARISON:  07/19/2018 FINDINGS: Slight increase in bilateral interstitial opacities. Small right pleural effusion now evident, with adjacent atelectasis/consolidation at the right lung base. Heart size upper limits normal. No pneumothorax. Cervical fixation hardware as before. IMPRESSION: Worsening interstitial edema/infiltrates and small right pleural effusion. Electronically Signed   By: Lucrezia Europe M.D.   On: 08/27/2018 14:55    Cardiac Studies:  ECG: SR no acute ST changes    Telemetry:  NSR 08/28/2018   Echo: Being done now EF normal mild AS mean gradient around 12 mmHg RV dilatation from OSA   Medications:    aspirin EC  81 mg Oral Daily   atorvastatin  10 mg Oral Q48H   canagliflozin  100 mg Oral QAC breakfast   chlorhexidine  15 mL Mouth Rinse BID   enoxaparin (LOVENOX) injection  40 mg Subcutaneous Q24H   escitalopram  20 mg Oral BID   furosemide  40 mg Intravenous BID   insulin detemir  80  Units Subcutaneous BID   lisinopril  10 mg Oral Daily   mouth rinse  15 mL Mouth Rinse q12n4p   metoprolol tartrate  12.5 mg Oral BID   potassium chloride  20 mEq Oral BID   sodium chloride flush  3 mL Intravenous Q12H      sodium chloride      Assessment/Plan:   "Dyspnea:  Mild volume overload EF normal no chest pain BNP only 188 with CXR minimal cephalizaiton. Continue iv bid diuresis Suspect good deal of this is obesity with hypoventilation Continue CPAP No acute ECG changes or chest pain Only history of distal LAD intervention in 2016 Continue statin and ASA with beta blocker Suspect she may be ready for d/c in am and has virtual visit f/u with Dr Domenic Polite already scheduled for Thursday   [facility] 08/28/2018, 8:27 AM

## 2018-08-28 NOTE — Progress Notes (Signed)
PROGRESS NOTE  [name] FIE:[id number] DOB: February 27, 1955 DOA: 08/27/2018 PCP: Monico Blitz, MD   LOS: 1 day   Brief narrative: Patient is a 63 year old female with obesity, hypertension, hyperlipidemia, diabetes mellitus, history of CAD status post PCI in 2016, COPD who continues to smoke half pack per day. She presented to the ED on 8/2 with complaint of worsening dyspnea, pedal edema and abdominal distention for 5 days. In the ED, oxygen saturation was low at 84%  Blood work showed BNP 188, troponin 61 Chest x-ray showed worsening pulmonary edema and small right pleural effusion CT scan done on her last presentation to ED on July 31 showed hepatic steatosis and anasarca in the abdominal and pelvic wall.  Subjective: Patient was seen and examined this morning. Sitting up at the edge of the bed.  Not in distress.  Shortness of breath improving.  Pedal edema improving.  Assessment/Plan:  Principal Problem:   Acute diastolic CHF (congestive heart failure) (HCC) Active Problems:   CAD (coronary artery disease)   COPD (chronic obstructive pulmonary disease) (HCC)   DM type 2 causing vascular disease (HCC)   Current smoker   HLD (hyperlipidemia)   Morbid obesity (HCC)   Anasarca   Acute respiratory failure with hypoxia (HCC)   Pleural effusion due to CHF (congestive heart failure) (HCC)   Hypokalemia  Acute exacerbation of diastolic congestive heart failure -Presented with shortness of breath.  Found to have anasarca, pleural effusion. -Currently IV Lasix twice daily. -Repeat echocardiogram today pending. -Sodium restricted diet, 1200 mils fluid restriction a day. -Currently the consultation appreciated.  Elevated troponin/ history of CAD /hyperlipidemia -Slight elevation.  Not consistent with ischemia. -EKG does not show any ischemic changes. 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  COPD - Continue bronchodilators.  Hypokalemia -3.3 today.  On scheduled potassium 20 mg twice daily.  Recheck tomorrow.  Morbid obesity - Body mass index is 40.82 kg/m.  Depression -Lexapro  Mobility: Encourage ambulation Diet: Low-salt diet/diabetic diet DVT prophylaxis:  Lovenox Code Status:   Code Status: Full Code  Family Communication:  Expected Discharge:  Likely home tomorrow  Consultants:  Cardiology  Procedures:  none  Antimicrobials:  Anti-infectives (From admission, onward)   Start     Dose/Rate Route Frequency Ordered Stop   08/27/18 2200  clindamycin (CLEOCIN) capsule 600 mg  Status:  Discontinued     600 mg Oral 3 times daily 08/27/18 1834 08/27/18 1929   08/27/18 1700  vancomycin (VANCOCIN) 2,500 mg in sodium chloride 0.9 % 500 mL IVPB     2,500 mg 250 mL/hr over 120 Minutes Intravenous  Once 08/27/18 1538 08/27/18 1916   08/27/18 1545  cefTRIAXone (ROCEPHIN) 1 g in sodium chloride 0.9 % 100 mL IVPB     1 g 200 mL/hr over 30 Minutes Intravenous  Once 08/27/18 1534 08/27/18 1659   08/27/18 1545  azithromycin (ZITHROMAX) 500 mg in sodium chloride 0.9 % 250 mL IVPB     500 mg 250 mL/hr over 60 Minutes Intravenous  Once 08/27/18 1534 08/27/18 1815      Infusions:  . sodium chloride      Scheduled Meds: . aspirin EC  81 mg Oral Daily  . atorvastatin  10 mg Oral Q48H  . canagliflozin  100 mg Oral QAC breakfast  . chlorhexidine  15 mL Mouth Rinse BID  .  enoxaparin (LOVENOX) injection  40 mg Subcutaneous Q24H  . escitalopram  20 mg Oral BID  . furosemide  40 mg Intravenous BID  . insulin detemir  80 Units Subcutaneous BID  . lisinopril  10 mg Oral Daily  . mouth rinse  15 mL Mouth Rinse q12n4p  . metoprolol tartrate  12.5 mg Oral BID  . potassium chloride  20 mEq Oral BID  . sodium chloride flush  3 mL Intravenous Q12H    PRN meds: sodium chloride,  acetaminophen, albuterol, ALPRAZolam, hydrocortisone, ondansetron (ZOFRAN) IV, sodium chloride flush, zolpidem   Objective: Vitals:   08/28/18 0849 08/28/18 1151  BP: (!) 134/45 111/61  Pulse: (!) 59 (!) 54  Resp: 18   Temp: 98.3 F (36.8 C) 98.1 F (36.7 C)  SpO2: 97% 97%    Intake/Output Summary (Last 24 hours) at 08/28/2018 1320 Last data filed at 08/28/2018 0810 Gross per 24 hour  Intake 1057.23 ml  Output 450 ml  Net 607.23 ml   Filed Weights   08/27/18 1412 08/27/18 1742 08/28/18 0507  Weight: 122.9 kg 117.8 kg 107.9 kg   Weight change:  Body mass index is 40.82 kg/m.   Physical Exam: General exam: Appears calm and comfortable.  Not in distress Skin: No rashes, lesions or ulcers. HEENT: Atraumatic, normocephalic, supple neck, no obvious bleeding Lungs: Clear to auscultate bilaterally CVS: Regular rate and rhythm, no murmur GI/Abd soft, nontender, distended from obesity, bowel sound present CNS: Alert,, oriented x3 Psychiatry: Mood appropriate Extremities: Improving pedal edema on both legs, skin puckering noted.  Data Review: I have personally reviewed the laboratory data and studies available.  Recent Labs  Lab 08/25/18 1116 08/27/18 1350 08/27/18 2046  WBC 5.0 5.4 5.4  NEUTROABS 3.2  --   --   HGB 13.7 13.9 13.1  HCT 45.1 45.1 41.8  MCV 96.0 96.0 94.8  PLT 252 247 227   Recent Labs  Lab 08/25/18 1135 08/27/18 1350 08/27/18 2046 08/28/18 0746  NA 139 139  --  140  K 3.7 3.3*  --  3.3*  CL 101 100  --  99  CO2 27 27  --  30  GLUCOSE 145* 175*  --  77  BUN 18 13  --  14  CREATININE 0.85 0.96 1.00 1.05*  CALCIUM 8.2* 8.5*  --  8.3*  MG  --   --   --  1.7    Terrilee Croak, MD  [facility] 08/28/2018

## 2018-08-29 LAB — GLUCOSE, CAPILLARY
Glucose-Capillary: 42 mg/dL — CL (ref 70–99)
Glucose-Capillary: 73 mg/dL (ref 70–99)
Glucose-Capillary: 115 mg/dL — ABNORMAL HIGH (ref 70–99)

## 2018-08-29 LAB — BASIC METABOLIC PANEL
Anion gap: 11 (ref 5–15)
BUN: 17 mg/dL (ref 8–23)
CO2: 32 mmol/L (ref 22–32)
Calcium: 8.3 mg/dL — ABNORMAL LOW (ref 8.9–10.3)
Chloride: 98 mmol/L (ref 98–111)
Creatinine, Ser: 0.93 mg/dL (ref 0.44–1.00)
GFR calc Af Amer: >60 mL/min (ref >60)
GFR calc non Af Amer: >60 mL/min (ref >60)
Glucose, Bld: 48 mg/dL — ABNORMAL LOW (ref 70–99)
Potassium: 3.1 mmol/L — ABNORMAL LOW (ref 3.5–5.1)
Sodium: 141 mmol/L (ref 135–145)

## 2018-08-29 MED ORDER — POTASSIUM CHLORIDE CRYS ER 20 MEQ PO TBCR: 20.0000 meq | EXTENDED_RELEASE_TABLET | Freq: Every day | ORAL | 0 refills | Status: DC

## 2018-08-29 MED ORDER — FUROSEMIDE 20 MG PO TABS: 40.0000 mg | ORAL_TABLET | Freq: Every day | ORAL | 0 refills | Status: DC

## 2018-08-29 NOTE — Progress Notes (Signed)
Hypoglycemic Event  CBG: 42  Treatment: 2 orange juice  Symptoms: asymptomatic  Follow-up CBG: [id number]  CBG Result:73  Possible Reasons for Event: sometimes it happen as per PT, who takes levemir 80 units 2x daily. Had snack last night.  Comments/MD notified:no    Tanya Nones D Zyden Suman

## 2018-08-29 NOTE — Progress Notes (Signed)
Patient ID: [name], female   DOB: [birth year], 63 y.o.   MRN: [id number]     Subjective:   Ready for d/c wearing CPAP rested well Had low BS this am better now   Objective:  Vitals:   08/28/18 1701 08/28/18 1717 08/28/18 2052 08/29/18 0520  BP: (!) 134/57  (!) 98/35 (!) 144/62  Pulse: 63  66 63  Resp: 16  18 17   Temp: 98.1 F (36.7 C)  98.1 F (36.7 C) 98 F (36.7 C)  TempSrc: Oral  Oral Oral  SpO2: (!) 87% 98% 93% 91%  Weight:    116.3 kg  Height:        Intake/Output from previous day:  Intake/Output Summary (Last 24 hours) at 08/29/2018 0906 Last data filed at 08/29/2018 0600 Gross per 24 hour  Intake 916 ml  Output 1550 ml  Net -634 ml    Physical Exam:  Obese white female OSA Lungs basilar atelectasis Trace LE edema BS positive Getting 2 D currently  Lab Results: Basic Metabolic Panel: Recent Labs    08/28/18 0746 08/29/18 0535  NA 140 141  K 3.3* 3.1*  CL 99 98  CO2 30 32  GLUCOSE 77 48*  BUN 14 17  CREATININE 1.05* 0.93  CALCIUM 8.3* 8.3*  MG 1.7  --    Liver Function Tests: No results for input(s): AST, ALT, ALKPHOS, BILITOT, PROT, ALBUMIN in the last 72 hours. No results for input(s): LIPASE, AMYLASE in the last 72 hours. CBC: Recent Labs    08/27/18 1350 08/27/18 2046  WBC 5.4 5.4  HGB 13.9 13.1  HCT 45.1 41.8  MCV 96.0 94.8  PLT 247 227   Cardiac Enzymes: No results for input(s): CKTOTAL, CKMB, CKMBINDEX, TROPONINI in the last 72 hours. BNP: Invalid input(s): POCBNP D-Dimer: No results for input(s): DDIMER in the last 72 hours. Hemoglobin A1C: No results for input(s): HGBA1C in the last 72 hours.  Imaging: Dg Chest 2 View  Result Date: 08/27/2018 CLINICAL DATA:  To ED eval of worsening sob and abd pain. Seen at [facility] Friday and due for admit for cellulitis of abd and pleural effusion but left prior to getting inpt bed assigned. Pt states sob is worse today. O2 stats 84% on RA. Hx MI, COPD, CAD, diabetes EXAM: CHEST  - 2 VIEW COMPARISON:  07/19/2018 FINDINGS: Slight increase in bilateral interstitial opacities. Small right pleural effusion now evident, with adjacent atelectasis/consolidation at the right lung base. Heart size upper limits normal. No pneumothorax. Cervical fixation hardware as before. IMPRESSION: Worsening interstitial edema/infiltrates and small right pleural effusion. Electronically Signed   By: Lucrezia Europe M.D.   On: 08/27/2018 14:55    Cardiac Studies:  ECG: SR no acute ST changes    Telemetry:  NSR 08/29/2018   Echo: Being done now EF normal mild AS mean gradient around 12 mmHg RV dilatation from OSA   Medications:   . aspirin EC  81 mg Oral Daily  . atorvastatin  10 mg Oral Q48H  . canagliflozin  100 mg Oral QAC breakfast  . chlorhexidine  15 mL Mouth Rinse BID  . enoxaparin (LOVENOX) injection  40 mg Subcutaneous Q24H  . escitalopram  20 mg Oral BID  . furosemide  40 mg Intravenous BID  . insulin detemir  80 Units Subcutaneous BID  . lisinopril  10 mg Oral Daily  . mouth rinse  15 mL Mouth Rinse q12n4p  . metoprolol tartrate  12.5 mg Oral BID  .  potassium chloride  20 mEq Oral BID  . sodium chloride flush  3 mL Intravenous Q12H     . sodium chloride      Assessment/Plan:   "Dyspnea:  Mild volume overload EF normal no chest pain BNP only 188 with CXR minimal cephalizaiton. Continue iv bid diuresis Suspect good deal of this is obesity with hypoventilation Continue CPAP No acute ECG changes or chest pain Only history of distal LAD intervention in 2016 Continue statin and ASA with beta blocker D/c home has virtual visit with SM in am   Signing off  Jenkins Rouge 08/29/2018, 9:06 AM

## 2018-08-29 NOTE — Discharge Summary (Signed)
Physician Discharge Summary  [name]:[id number] DOB: [birth year] DOA: 08/27/2018  PCP: Monico Blitz, MD  Admit date: 08/27/2018 Discharge date: 08/29/2018  Admitted From: Home Discharge disposition: Home   Code Status: Full Code   Recommendations for Outpatient Follow-Up:   1. Follow-up with cardiology as an outpatient.  Discharge Diagnosis:   Principal Problem:   Acute diastolic CHF (congestive heart failure) (HCC) Active Problems:   CAD (coronary artery disease)   COPD (chronic obstructive pulmonary disease) (HCC)   DM type 2 causing vascular disease (HCC)   Current smoker   HLD (hyperlipidemia)   Morbid obesity (HCC)   Anasarca   Acute respiratory failure with hypoxia (HCC)   Pleural effusion due to CHF (congestive heart failure) (HCC)   Hypokalemia    History of Present Illness / Brief narrative:  Patient is a 63 year old female with obesity, hypertension, hyperlipidemia, diabetes mellitus, history of CAD status post PCI in 2016, COPD who continues to smoke half pack per day. She presented to the ED on 8/2 with complaint of worsening dyspnea, pedal edema and abdominal distention for 5 days. In the ED, oxygen saturation was low at 84%  Blood work showed BNP 188, troponin 61 Chest x-ray showed worsening pulmonary edema and small right pleural effusion CT scan done on her last presentation to ED on July 31 showed hepatic steatosis and anasarca in the abdominal and pelvic wall.  Subjective:  Patient was seen and examined this morning.  Middle-aged Caucasian female, sleeping with CPAP on.  Wakes up on verbal command.  Feels better.  Pedal edema improving.  Hospital Course:  Acute exacerbation of diastolic congestive heart failure/right-sided heart failure -Presented with shortness of breath.  Found to have anasarca, pleural effusion. -Cardiology consult appreciated. -Diuresed with IV Lasix twice daily.  Pedal edema improving.  No shortness of breath at this  time. -Repeat echocardiogram showed EF 60 to 65% and elevated RVSP to 57 mmHg. -Elevated pressure on the right side likely because of underlying sleep apnea and COPD. -Recommend Lasix 40 mg daily at home. -Sodium restricted diet, 1200 mils fluid restriction a day.  Elevated troponin/ history of CAD /hyperlipidemia -Slight elevation.  Not consistent with ischemia. -EKG does not show any ischemic changes. -Continue aspirin, statin, metoprolol  Diabetes mellitus type 2 with vascular disease -PTA, patient was on Levemir 80 units twice daily, sliding scale insulin, paroxetine, metformin -Resume same at home.  Patient's blood sugar this morning was in 40s.  She states that this is sometimes gets at home.  Encouraged to monitor blood sugar more frequently and cut down on insulin dosing if needed.  COPD - Continue bronchodilators.  Hypokalemia - repleted.  Morbid obesity - Body mass index is 40.82 kg/m.  Depression -Lexapro  Stable for discharge to home today.  Discharge Exam:   Vitals:   08/28/18 1701 08/28/18 1717 08/28/18 2052 08/29/18 0520  BP: (!) 134/57  (!) 98/35 (!) 144/62  Pulse: 63  66 63  Resp: 16  18 17   Temp: 98.1 F (36.7 C)  98.1 F (36.7 C) 98 F (36.7 C)  TempSrc: Oral  Oral Oral  SpO2: (!) 87% 98% 93% 91%  Weight:    116.3 kg  Height:        Body mass index is 43.99 kg/m.  General exam: Appears calm and comfortable.  Skin: No rashes, lesions or ulcers. HEENT: Atraumatic, normocephalic, supple neck, no obvious bleeding Lungs: Clear to auscultation bilaterally CVS: Regular rate and rhythm, no murmur GI/Abd  soft, nontender, nondistended, bowel sound present CNS: Alert, awake, oriented x3 Psychiatry: Mood appropriate Extremities: Trace bilateral pedal edema improving, skin puckering noted  Discharge Instructions:  Wound care: No Discharge Instructions    Diet - low sodium heart healthy   Complete by: As directed    Increase activity slowly    Complete by: As directed       Allergies as of 08/29/2018      Reactions   Codeine Nausea And Vomiting   Sulfa Antibiotics Rash      Medication List    STOP taking these medications   clindamycin 300 MG capsule Commonly known as: CLEOCIN   meclizine 25 MG tablet Commonly known as: ANTIVERT   traMADol 50 MG tablet Commonly known as: ULTRAM     TAKE these medications   acetaminophen 500 MG tablet Commonly known as: TYLENOL Take 500 mg by mouth daily as needed for moderate pain or headache.   albuterol (2.5 MG/3ML) 0.083% nebulizer solution Commonly known as: PROVENTIL Take 3 mLs (2.5 mg total) by nebulization every 4 (four) hours as needed for wheezing or shortness of breath.   albuterol 108 (90 Base) MCG/ACT inhaler Commonly known as: VENTOLIN HFA Inhale 2 puffs into the lungs every 4 (four) hours as needed for wheezing or shortness of breath.   aspirin EC 81 MG tablet Take 81 mg by mouth daily.   atorvastatin 10 MG tablet Commonly known as: LIPITOR TAKE 1 TABLET (10 MG TOTAL) BY MOUTH EVERY OTHER DAY. TAKE AT DINNER What changed:   when to take this  additional instructions   escitalopram 20 MG tablet Commonly known as: LEXAPRO Take 20 mg by mouth 2 (two) times a day.   Farxiga 10 MG Tabs tablet Generic drug: dapagliflozin propanediol Take 10 mg by mouth daily.   furosemide 20 MG tablet Commonly known as: LASIX Take 2 tablets (40 mg total) by mouth daily. What changed:   how much to take  when to take this  additional instructions   hydrocortisone 2.5 % rectal cream Commonly known as: ANUSOL-HC Apply 1 application topically 4 (four) times daily as needed for hemorrhoids.   Levemir FlexTouch 100 UNIT/ML Pen Generic drug: Insulin Detemir Inject 80 Units into the skin 2 (two) times daily.   lisinopril 10 MG tablet Commonly known as: ZESTRIL TAKE 1 TABLET BY MOUTH EVERY DAY   metFORMIN 1000 MG tablet Commonly known as: GLUCOPHAGE Take 1,000  mg by mouth 2 (two) times daily.   metoprolol tartrate 25 MG tablet Commonly known as: LOPRESSOR TAKE 1/2 TABLET BY MOUTH TWICE A DAY   NovoLOG FlexPen 100 UNIT/ML FlexPen Generic drug: insulin aspart Inject 2-8 Units into the skin See admin instructions. Inject 2-8 units subcutaneously three times daily before meals if CBG is over 150 per sliding meals - 150-250 2 units, >250 8 units   potassium chloride SA 20 MEQ tablet Commonly known as: K-DUR Take 1 tablet (20 mEq total) by mouth daily.   Vitamin D (Ergocalciferol) 1.25 MG (50000 UT) Caps capsule Commonly known as: DRISDOL Take 50,000 Units by mouth every Saturday.       Time coordinating discharge: 35 minutes  The results of significant diagnostics from this hospitalization (including imaging, microbiology, ancillary and laboratory) are listed below for reference.    Procedures and Diagnostic Studies:   Dg Chest 2 View  Result Date: 08/27/2018 CLINICAL DATA:  To ED eval of worsening sob and abd pain. Seen at [facility] Friday and due for admit  for cellulitis of abd and pleural effusion but left prior to getting inpt bed assigned. Pt states sob is worse today. O2 stats 84% on RA. Hx MI, COPD, CAD, diabetes EXAM: CHEST - 2 VIEW COMPARISON:  07/19/2018 FINDINGS: Slight increase in bilateral interstitial opacities. Small right pleural effusion now evident, with adjacent atelectasis/consolidation at the right lung base. Heart size upper limits normal. No pneumothorax. Cervical fixation hardware as before. IMPRESSION: Worsening interstitial edema/infiltrates and small right pleural effusion. Electronically Signed   By: Lucrezia Europe M.D.   On: 08/27/2018 14:55     Labs:   Basic Metabolic Panel: Recent Labs  Lab 08/25/18 1135 08/27/18 1350 08/27/18 2046 08/28/18 0746 08/29/18 0535  NA 139 139  --  140 141  K 3.7 3.3*  --  3.3* 3.1*  CL 101 100  --  99 98  CO2 27 27  --  30 32  GLUCOSE 145* 175*  --  77 48*  BUN 18 13  --  14  17  CREATININE 0.85 0.96 1.00 1.05* 0.93  CALCIUM 8.2* 8.5*  --  8.3* 8.3*  MG  --   --   --  1.7  --    GFR Estimated Creatinine Clearance: 78.5 mL/min (by C-G formula based on SCr of 0.93 mg/dL). Liver Function Tests: Recent Labs  Lab 08/25/18 1135  AST 34  ALT 32  ALKPHOS 77  BILITOT 1.0  PROT 7.9  ALBUMIN 3.2*   Recent Labs  Lab 08/25/18 1135  LIPASE 25   No results for input(s): AMMONIA in the last 168 hours. Coagulation profile No results for input(s): INR, PROTIME in the last 168 hours.  CBC: Recent Labs  Lab 08/25/18 1116 08/27/18 1350 08/27/18 2046  WBC 5.0 5.4 5.4  NEUTROABS 3.2  --   --   HGB 13.7 13.9 13.1  HCT 45.1 45.1 41.8  MCV 96.0 96.0 94.8  PLT 252 247 227   Cardiac Enzymes: No results for input(s): CKTOTAL, CKMB, CKMBINDEX, TROPONINI in the last 168 hours. BNP: Invalid input(s): POCBNP CBG: Recent Labs  Lab 08/28/18 1331 08/28/18 1703 08/28/18 2159 08/29/18 0628 08/29/18 0658  GLUCAP 101* 74 105* 42* 73   D-Dimer No results for input(s): DDIMER in the last 72 hours. Hgb A1c No results for input(s): HGBA1C in the last 72 hours. Lipid Profile No results for input(s): CHOL, HDL, LDLCALC, TRIG, CHOLHDL, LDLDIRECT in the last 72 hours. Thyroid function studies No results for input(s): TSH, T4TOTAL, T3FREE, THYROIDAB in the last 72 hours.  Invalid input(s): FREET3 Anemia work up No results for input(s): VITAMINB12, FOLATE, FERRITIN, TIBC, IRON, RETICCTPCT in the last 72 hours. Microbiology Recent Results (from the past 240 hour(s))  Culture, Urine     Status: Abnormal   Collection Time: 08/25/18 10:54 AM   Specimen: Urine, Clean Catch  Result Value Ref Range Status   Specimen Description   Final    URINE, CLEAN CATCH Performed at [facility], [address] [place], [place] [address]    Special Requests   Final    NONE Performed at [facility], [address] [place], [place] [address]    Culture MULTIPLE SPECIES PRESENT,  SUGGEST RECOLLECTION (A)  Final   Report Status 08/26/2018 FINAL  Final  SARS Coronavirus 2 [facility] order, Performed in [facility] hospital lab) Nasopharyngeal Nasopharyngeal Swab     Status: None   Collection Time: 08/25/18  3:39 PM   Specimen: Nasopharyngeal Swab  Result Value Ref Range Status  SARS Coronavirus 2 NEGATIVE NEGATIVE Final    Comment: (NOTE) If result is NEGATIVE SARS-CoV-2 target nucleic acids are NOT DETECTED. The SARS-CoV-2 RNA is generally detectable in upper and lower  respiratory specimens during the acute phase of infection. The lowest  concentration of SARS-CoV-2 viral copies this assay can detect is 250  copies / mL. A negative result does not preclude SARS-CoV-2 infection  and should not be used as the sole basis for treatment or other  patient management decisions.  A negative result may occur with  improper specimen collection / handling, submission of specimen other  than nasopharyngeal swab, presence of viral mutation(s) within the  areas targeted by this assay, and inadequate number of viral copies  (<250 copies / mL). A negative result must be combined with clinical  observations, patient history, and epidemiological information. If result is POSITIVE SARS-CoV-2 target nucleic acids are DETECTED. The SARS-CoV-2 RNA is generally detectable in upper and lower  respiratory specimens dur ing the acute phase of infection.  Positive  results are indicative of active infection with SARS-CoV-2.  Clinical  correlation with patient history and other diagnostic information is  necessary to determine patient infection status.  Positive results do  not rule out bacterial infection or co-infection with other viruses. If result is PRESUMPTIVE POSTIVE SARS-CoV-2 nucleic acids MAY BE PRESENT.   A presumptive positive result was obtained on the submitted specimen  and confirmed on repeat testing.  While 2019 novel coronavirus  (SARS-CoV-2) nucleic acids may be  present in the submitted sample  additional confirmatory testing may be necessary for epidemiological  and / or clinical management purposes  to differentiate between  SARS-CoV-2 and other Sarbecovirus currently known to infect humans.  If clinically indicated additional testing with an alternate test  methodology [phone]) is advised. The SARS-CoV-2 RNA is generally  detectable in upper and lower respiratory sp ecimens during the acute  phase of infection. The expected result is Negative. Fact Sheet for Patients:  StrictlyIdeas.no Fact Sheet for Healthcare Providers: BankingDealers.co.za This test is not yet approved or cleared by the [place] FDA and has been authorized for detection and/or diagnosis of SARS-CoV-2 by FDA under an Emergency Use Authorization (EUA).  This EUA will remain in effect (meaning this test can be used) for the duration of the COVID-19 declaration under Section 564(b)(1) of the Act, 21 U.S.C. section 360bbb-3(b)(1), unless the authorization is terminated or revoked sooner. Performed at [facility], [address], [place], [place] [address]   C difficile quick scan w PCR reflex     Status: None   Collection Time: 08/25/18  6:58 PM   Specimen: STOOL  Result Value Ref Range Status   C Diff antigen NEGATIVE NEGATIVE Final   C Diff toxin NEGATIVE NEGATIVE Final   C Diff interpretation No C. difficile detected.  Final    Comment: Performed at [facility], [address], [place], [place] [address]  Gastrointestinal Panel by PCR , Stool     Status: None   Collection Time: 08/25/18  6:58 PM   Specimen: STOOL  Result Value Ref Range Status   Campylobacter species NOT DETECTED NOT DETECTED Final   Plesimonas shigelloides NOT DETECTED NOT DETECTED Final   Salmonella species NOT DETECTED NOT DETECTED Final   Yersinia enterocolitica NOT DETECTED NOT DETECTED Final   Vibrio species NOT DETECTED NOT DETECTED Final    Vibrio cholerae NOT DETECTED NOT DETECTED Final   Enteroaggregative E coli (EAEC) NOT DETECTED NOT DETECTED Final  Enteropathogenic E coli (EPEC) NOT DETECTED NOT DETECTED Final   Enterotoxigenic E coli (ETEC) NOT DETECTED NOT DETECTED Final   Shiga like toxin producing E coli (STEC) NOT DETECTED NOT DETECTED Final   Shigella/Enteroinvasive E coli (EIEC) NOT DETECTED NOT DETECTED Final   Cryptosporidium NOT DETECTED NOT DETECTED Final   Cyclospora cayetanensis NOT DETECTED NOT DETECTED Final   Entamoeba histolytica NOT DETECTED NOT DETECTED Final   Giardia lamblia NOT DETECTED NOT DETECTED Final   Adenovirus F40/41 NOT DETECTED NOT DETECTED Final   Astrovirus NOT DETECTED NOT DETECTED Final   Norovirus GI/GII NOT DETECTED NOT DETECTED Final   Rotavirus A NOT DETECTED NOT DETECTED Final   Sapovirus (I, II, IV, and V) NOT DETECTED NOT DETECTED Final    Comment: Performed at [facility], [address] [place], [place] [address]  SARS Coronavirus 2 [facility] order, Performed in [facility] hospital lab) Nasopharyngeal Nasopharyngeal Swab     Status: None   Collection Time: 08/27/18  4:18 PM   Specimen: Nasopharyngeal Swab  Result Value Ref Range Status   SARS Coronavirus 2 NEGATIVE NEGATIVE Final    Comment: (NOTE) If result is NEGATIVE SARS-CoV-2 target nucleic acids are NOT DETECTED. The SARS-CoV-2 RNA is generally detectable in upper and lower  respiratory specimens during the acute phase of infection. The lowest  concentration of SARS-CoV-2 viral copies this assay can detect is 250  copies / mL. A negative result does not preclude SARS-CoV-2 infection  and should not be used as the sole basis for treatment or other  patient management decisions.  A negative result may occur with  improper specimen collection / handling, submission of specimen other  than nasopharyngeal swab, presence of viral mutation(s) within the  areas targeted by this assay, and inadequate number of  viral copies  (<250 copies / mL). A negative result must be combined with clinical  observations, patient history, and epidemiological information. If result is POSITIVE SARS-CoV-2 target nucleic acids are DETECTED. The SARS-CoV-2 RNA is generally detectable in upper and lower  respiratory specimens dur ing the acute phase of infection.  Positive  results are indicative of active infection with SARS-CoV-2.  Clinical  correlation with patient history and other diagnostic information is  necessary to determine patient infection status.  Positive results do  not rule out bacterial infection or co-infection with other viruses. If result is PRESUMPTIVE POSTIVE SARS-CoV-2 nucleic acids MAY BE PRESENT.   A presumptive positive result was obtained on the submitted specimen  and confirmed on repeat testing.  While 2019 novel coronavirus  (SARS-CoV-2) nucleic acids may be present in the submitted sample  additional confirmatory testing may be necessary for epidemiological  and / or clinical management purposes  to differentiate between  SARS-CoV-2 and other Sarbecovirus currently known to infect humans.  If clinically indicated additional testing with an alternate test  methodology [phone]) is advised. The SARS-CoV-2 RNA is generally  detectable in upper and lower respiratory sp ecimens during the acute  phase of infection. The expected result is Negative. Fact Sheet for Patients:  StrictlyIdeas.no Fact Sheet for Healthcare Providers: BankingDealers.co.za This test is not yet approved or cleared by the [place] FDA and has been authorized for detection and/or diagnosis of SARS-CoV-2 by FDA under an Emergency Use Authorization (EUA).  This EUA will remain in effect (meaning this test can be used) for the duration of the COVID-19 declaration under Section 564(b)(1) of the Act, 21 U.S.C. section 360bbb-3(b)(1), unless the authorization is  terminated or revoked sooner. Performed at [facility], [place] [address], [place], [place] [address]     Signed: Terrilee Croak  [facility] 08/29/2018, 11:01 AM

## 2018-08-30 ENCOUNTER — Telehealth (INDEPENDENT_AMBULATORY_CARE_PROVIDER_SITE_OTHER): Payer: BC Managed Care – PPO | Admitting: Cardiology

## 2018-08-30 ENCOUNTER — Encounter: Payer: Self-pay | Admitting: Cardiology

## 2018-08-30 VITALS — Ht 64.0 in

## 2018-08-30 DIAGNOSIS — G4733 Obstructive sleep apnea (adult) (pediatric): Secondary | ICD-10-CM

## 2018-08-30 DIAGNOSIS — I25119 Atherosclerotic heart disease of native coronary artery with unspecified angina pectoris: Secondary | ICD-10-CM

## 2018-08-30 DIAGNOSIS — I350 Nonrheumatic aortic (valve) stenosis: Secondary | ICD-10-CM

## 2018-08-30 DIAGNOSIS — I5031 Acute diastolic (congestive) heart failure: Secondary | ICD-10-CM

## 2018-08-30 DIAGNOSIS — Z9989 Dependence on other enabling machines and devices: Secondary | ICD-10-CM

## 2018-08-30 DIAGNOSIS — I5032 Chronic diastolic (congestive) heart failure: Secondary | ICD-10-CM

## 2018-08-30 NOTE — Patient Instructions (Addendum)
Medication Instructions:   Your physician recommends that you continue on your current medications as directed. Please refer to the Current Medication list given to you today.  Labwork:  Your physician recommends that you return for lab work in: 4-6 weeks just before your visit to check your BMET. You lab order has been faxed to [facility].  Testing/Procedures:  NONE  Follow-Up:  Your physician recommends that you schedule a follow-up appointment in: 4-6 weeks.  Any Other Special Instructions Will Be Listed Below (If Applicable).  If you need a refill on your cardiac medications before your next appointment, please call your pharmacy.

## 2018-08-30 NOTE — Progress Notes (Signed)
Virtual Visit via Telephone Note   This visit type was conducted due to national recommendations for restrictions regarding the COVID-19 Pandemic (e.g. social distancing) in an effort to limit this patient's exposure and mitigate transmission in our community.  Due to her co-morbid illnesses, this patient is at least at moderate risk for complications without adequate follow up.  This format is felt to be most appropriate for this patient at this time.  The patient did not have access to video technology/had technical difficulties with video requiring transitioning to audio format only (telephone).  All issues noted in this document were discussed and addressed.  No physical exam could be performed with this format.  Please refer to the patient's chart for her  consent to telehealth for [facility].   Date:  08/30/2018   ID:  [name], DOB November 07, 1955, MRN [id number]  Patient Location: Home Provider Location: Office  PCP:  Monico Blitz, MD  Cardiologist:  Rozann Lesches, MD Electrophysiologist:  None   Evaluation Performed:  Follow-Up Visit  Chief Complaint:   Cardiac follow-up  History of Present Illness:    [name] is a 63 y.o. female last seen in September 2019.  I reviewed interval records, she was just discharged from the hospital yesterday.  She presented with worsening shortness of breath and leg swelling, also acute hypoxic respiratory failure.  Chest x-ray described pulmonary edema and small right pleural effusion.  She was treated with IV Lasix and had a follow-up echocardiogram showing LVEF 60 to 65% with moderate pulmonary hypertension.  OSA was felt to be contributing as well and she was managed with CPAP, also treated for COPD with bronchodilators.  High-sensitivity troponin I levels were not suggestive of ACS.  We spoke by phone today.  She tells me that she feels better overall, still gaining strength.  She confirms that she has a scale at home and can weigh  herself.  She is now on Lasix at 40 mg daily with potassium supplements.  Her weight is down about 14 pounds compared to July.  She states that she is compliant with CPAP, also uses oxygen at nighttime.  The patient does not have symptoms concerning for COVID-19 infection (fever, chills, cough, or new shortness of breath).    Past Medical History:  Diagnosis Date  . Anxiety   . Aortic stenosis    a. Mild by 2D ECHO 11/17/14  . CAD (coronary artery disease)    a. 10/2014: Ant STEMI s/p DES to dLAD  . Carotid disease, bilateral ([place])    a. Duplex 12/2014: mild-mod atherosclerotic plaque without hemodynamically significant stenosis.  [name] COPD (chronic obstructive pulmonary disease) ([place])   . Essential hypertension   . HLD (hyperlipidemia)   . Morbid obesity ([place])   . Myocardial infarction ([place]) 11/17/2014  . PONV (postoperative nausea and vomiting)   . Shingles   . Sleep apnea    uses CPAP  . Type 2 diabetes mellitus ([place])    Past Surgical History:  Procedure Laterality Date  . ABDOMINAL HYSTERECTOMY    . BREAST BIOPSY Left    2016  . CARDIAC CATHETERIZATION N/A 11/17/2014   Procedure: Left Heart Cath and Coronary Angiography;  Surgeon: Wellington Hampshire, MD;  Location: [place] [facility];  Service: Cardiovascular;  Laterality: N/A;  . CARDIAC CATHETERIZATION N/A 11/17/2014   Procedure: Coronary Stent Intervention;  Surgeon: Wellington Hampshire, MD;  Location: [place] [facility];  Service: Cardiovascular;  Laterality: N/A;  distal lad  2.25x20 promus  . CATARACT EXTRACTION W/PHACO Left 09/20/2016   Procedure: CATARACT EXTRACTION PHACO AND INTRAOCULAR LENS PLACEMENT (IOC);  Surgeon: Tonny Branch, MD;  Location: [facility];  Service: Ophthalmology;  Laterality: Left;  CDE: 9.27  . CATARACT EXTRACTION W/PHACO Right 10/18/2016   Procedure: CATARACT EXTRACTION PHACO AND INTRAOCULAR LENS PLACEMENT (IOC);  Surgeon: Tonny Branch, MD;  Location: [facility];  Service: Ophthalmology;  Laterality: Right;  CDE:  6.38  . CHOLECYSTECTOMY    . SPINAL FUSION     cervical; screws and plates.     Current Meds  Medication Sig  . acetaminophen (TYLENOL) 500 MG tablet Take 500 mg by mouth daily as needed for moderate pain or headache.  . albuterol (PROVENTIL HFA;VENTOLIN HFA) 108 (90 Base) MCG/ACT inhaler Inhale 2 puffs into the lungs every 4 (four) hours as needed for wheezing or shortness of breath.  [name] albuterol (PROVENTIL) (2.5 MG/3ML) 0.083% nebulizer solution Take 3 mLs (2.5 mg total) by nebulization every 4 (four) hours as needed for wheezing or shortness of breath.  [name] aspirin EC 81 MG tablet Take 81 mg by mouth daily.  [name] atorvastatin (LIPITOR) 10 MG tablet TAKE 1 TABLET (10 MG TOTAL) BY MOUTH EVERY OTHER DAY. TAKE AT DINNER (Patient taking differently: Take 10 mg by mouth See admin instructions. Take one tablet (10 mg) by mouth every other night with supper)  . dapagliflozin propanediol (FARXIGA) 10 MG TABS tablet Take 10 mg by mouth daily.  [name] escitalopram (LEXAPRO) 20 MG tablet Take 20 mg by mouth 2 (two) times a day.  . furosemide (LASIX) 20 MG tablet Take 2 tablets (40 mg total) by mouth daily.  . hydrocortisone (ANUSOL-HC) 2.5 % rectal cream Apply 1 application topically 4 (four) times daily as needed for hemorrhoids.  . insulin aspart (NOVOLOG FLEXPEN) 100 UNIT/ML FlexPen Inject 2-8 Units into the skin See admin instructions. Inject 2-8 units subcutaneously three times daily before meals if CBG is over 150 per sliding meals - 150-250 2 units, >250 8 units  . Insulin Detemir (LEVEMIR FLEXTOUCH) 100 UNIT/ML Pen Inject 80 Units into the skin 2 (two) times daily.  [name] lisinopril (ZESTRIL) 10 MG tablet TAKE 1 TABLET BY MOUTH EVERY DAY (Patient taking differently: Take 10 mg by mouth daily. )  . metFORMIN (GLUCOPHAGE) 1000 MG tablet Take 1,000 mg by mouth 2 (two) times daily.   . metoprolol tartrate (LOPRESSOR) 25 MG tablet TAKE 1/2 TABLET BY MOUTH TWICE A DAY (Patient taking differently: Take 12.5 mg by  mouth 2 (two) times daily. )  . potassium chloride SA (K-DUR) 20 MEQ tablet Take 1 tablet (20 mEq total) by mouth daily.  . Vitamin D, Ergocalciferol, (DRISDOL) 50000 units CAPS capsule Take 50,000 Units by mouth every Saturday.     Allergies:   Codeine and Sulfa antibiotics   Social History   Tobacco Use  . Smoking status: Current Some Day Smoker    Packs/day: 0.25    Years: 30.00    Pack years: 7.50    Types: Cigarettes    Start date: 12/26/1972  . Smokeless tobacco: Never Used  . Tobacco comment: 3 cigs per day  Substance Use Topics  . Alcohol use: No    Alcohol/week: 0.0 standard drinks  . Drug use: No     Family Hx: The patient's family history includes Cancer in her brother; Heart attack in her father; Heart disease in her father.  ROS:   Please see the history of present illness. All other systems reviewed  and are negative.   Prior CV studies:   The following studies were reviewed today:  Echocardiogram 08/28/2018:  1. The left ventricle has normal systolic function with an ejection fraction of 60-65%. The cavity size was normal. There is mildly increased left ventricular wall thickness. Left ventricular diastolic Doppler parameters are consistent with  pseudonormalization. No evidence of left ventricular regional wall motion abnormalities.  2. The right ventricle has mildly reduced systolic function. The cavity was mildly enlarged. There is no increase in right ventricular wall thickness. D-shaped interventricular septum suggests RV pressure/volume overload.  3. No evidence of mitral valve stenosis. Trivial mitral regurgitation.  4. The aortic valve is tricuspid. Moderate calcification of the aortic valve. Mild stenosis of the aortic valve.  5. The aorta is normal in size and structure.  6. The aortic root is normal in size and structure.  7. The inferior vena cava was dilated in size with <50% respiratory variability. PA systolic pressure 57 mmHg.  Lexiscan Myoview  10/24/2017:  T wave inversion was noted during stress in the II, III, aVF, V5 and V6 leads after lexiscan injection. Overall nonspecific finding  The study is normal. There are no perfusion defects consistent with prior infarct or current ischemia.  This is a low risk study.  The left ventricular ejection fraction is normal (55-65%).  Labs/Other Tests and Data Reviewed:    EKG:  An ECG dated 08/27/2018 was personally reviewed today and demonstrated:  Sinus rhythm with rightward axis and low voltage, nonspecific T wave changes.  Recent Labs: 08/25/2018: ALT 32 08/27/2018: B Natriuretic Peptide 188.3; Hemoglobin 13.1; Platelets 227 08/28/2018: Magnesium 1.7 08/29/2018: BUN 17; Creatinine, Ser 0.93; Potassium 3.1; Sodium 141   Recent Lipid Panel Lab Results  Component Value Date/Time   CHOL 107 (L) 11/07/2015 10:37 AM   TRIG 154 (H) 11/07/2015 10:37 AM   HDL 38 (L) 11/07/2015 10:37 AM   CHOLHDL 2.8 11/07/2015 10:37 AM   LDLCALC 38 11/07/2015 10:37 AM    Wt Readings from Last 3 Encounters:  08/29/18 256 lb 4.8 oz (116.3 kg)  08/25/18 270 lb 15.1 oz (122.9 kg)  07/19/18 271 lb (122.9 kg)     Objective:    Vital Signs:  Ht 5\' 4"  (1.626 m)   BMI 43.99 kg/m    She did not have a way to check vital signs today. Patient spoke in full sentences, not short of breath. No audible wheezing or coughing. Speech pattern normal.  ASSESSMENT & PLAN:    1.  Recent presentation with fluid overload and hypoxic respiratory failure in the setting of diastolic dysfunction and also right ventricular dysfunction with moderate pulmonary hypertension.  Patient improved significantly with increase in diuretics and we will continue Lasix at 40 mg daily with potassium supplements for now.  She is to weigh daily.  If weight goes up 2 to 3 pounds in 24 hours she can take an extra Lasix 20 mg tablet.  We will arrange a follow-up visit in the next 4 to 6 weeks with [facility].  2.  Mild aortic valve stenosis,  asymptomatic.  3.  CAD status post DES to the LAD in October 2016.  No clear evidence of ACS by recent assessment.  LVEF normal at 60 to 65%.  She remains on aspirin and statin.  4.  OSA on CPAP.  COVID-19 Education: The signs and symptoms of COVID-19 were discussed with the patient and how to seek care for testing (follow up with PCP or arrange E-visit).  The importance of social distancing was discussed today.  Time:   Today, I have spent 8 minutes with the patient with telehealth technology discussing the above problems.     Medication Adjustments/Labs and Tests Ordered: Current medicines are reviewed at length with the patient today.  Concerns regarding medicines are outlined above.   Tests Ordered: Orders Placed This Encounter  Procedures  . Basic metabolic panel    Medication Changes: No orders of the defined types were placed in this encounter.   Follow Up:  In Person 4-6 weeks in the [place] office.  Signed, Rozann Lesches, MD  08/30/2018 2:50 PM    [place]

## 2018-09-20 ENCOUNTER — Other Ambulatory Visit (HOSPITAL_COMMUNITY)
Admission: RE | Admit: 2018-09-20 | Discharge: 2018-09-20 | Disposition: A | Discharge status: Home / Self Care | Payer: BC Managed Care – PPO | Source: Ambulatory Visit | Attending: Cardiology | Admitting: Cardiology

## 2018-09-20 DIAGNOSIS — I5031 Acute diastolic (congestive) heart failure: Secondary | ICD-10-CM | POA: Insufficient documentation

## 2018-09-20 LAB — BASIC METABOLIC PANEL
Anion gap: 9 (ref 5–15)
BUN: 12 mg/dL (ref 8–23)
CO2: 26 mmol/L (ref 22–32)
Calcium: 8.4 mg/dL — ABNORMAL LOW (ref 8.9–10.3)
Chloride: 100 mmol/L (ref 98–111)
Creatinine, Ser: 0.85 mg/dL (ref 0.44–1.00)
GFR calc Af Amer: >60 mL/min (ref >60)
GFR calc non Af Amer: >60 mL/min (ref >60)
Glucose, Bld: 243 mg/dL — ABNORMAL HIGH (ref 70–99)
Potassium: 4.1 mmol/L (ref 3.5–5.1)
Sodium: 135 mmol/L (ref 135–145)

## 2018-09-23 ENCOUNTER — Other Ambulatory Visit: Payer: Self-pay | Admitting: Cardiology

## 2018-09-27 ENCOUNTER — Other Ambulatory Visit: Payer: Self-pay

## 2018-09-27 ENCOUNTER — Ambulatory Visit (INDEPENDENT_AMBULATORY_CARE_PROVIDER_SITE_OTHER): Payer: BC Managed Care – PPO | Admitting: Student

## 2018-09-27 ENCOUNTER — Encounter: Payer: Self-pay | Admitting: Student

## 2018-09-27 VITALS — BP 110/59 | HR 88 | Temp 97.8°F | Ht 64.0 in | Wt 246.0 lb

## 2018-09-27 DIAGNOSIS — I2510 Atherosclerotic heart disease of native coronary artery without angina pectoris: Secondary | ICD-10-CM | POA: Diagnosis not present

## 2018-09-27 DIAGNOSIS — I350 Nonrheumatic aortic (valve) stenosis: Secondary | ICD-10-CM

## 2018-09-27 DIAGNOSIS — G4733 Obstructive sleep apnea (adult) (pediatric): Secondary | ICD-10-CM

## 2018-09-27 DIAGNOSIS — R5383 Other fatigue: Secondary | ICD-10-CM

## 2018-09-27 DIAGNOSIS — Z9989 Dependence on other enabling machines and devices: Secondary | ICD-10-CM

## 2018-09-27 DIAGNOSIS — I5189 Other ill-defined heart diseases: Secondary | ICD-10-CM | POA: Diagnosis not present

## 2018-09-27 DIAGNOSIS — I10 Essential (primary) hypertension: Secondary | ICD-10-CM | POA: Diagnosis not present

## 2018-09-27 MED ORDER — METOPROLOL SUCCINATE ER 25 MG PO TB24: 25.0000 mg | ORAL_TABLET | Freq: Every day | ORAL | 3 refills | Status: DC

## 2018-09-27 NOTE — Progress Notes (Signed)
Cardiology Office Note    Date:  09/27/2018   ID:  [name], [name] 09/22/1955, MRN [id number]  PCP:  Monico Blitz, MD  Cardiologist: Rozann Lesches, MD    Chief Complaint  Patient presents with  . Follow-up    1 month visit    History of Present Illness:    [name] is a 64 y.o. female with past medical history of CAD (s/p DES to LAD in 2016), mild AS, HTN, HLD, and OSA who presents for a 4-week follow-up visit.   She most recently had a telehealth visit with Dr. Domenic Polite on 08/30/2018 for hospital follow-up as she had recently been admitted with acute hypoxic respiratory failure secondary to volume overload and CXR showed pulmonary edema and a small right pleural effusion.  She was treated with IV Lasix during admission with improvement in her symptoms and echocardiogram showed a preserved EF of 60 to 65% with moderate pulmonary hypertension. Was doing well at the time of her visit and reported much improvement in her symptoms.  Reported her weight had declined by 14 pounds within the past month and she was continued on Lasix 40mg  daily.    In talking with the patient today, she reports overall doing well since her last office visit.  Says that her breathing has been at baseline and she denies any recurrent dyspnea on exertion, orthopnea, PND, or lower extremity edema.  Weight has also been stable on her home scales.  She denies any recent chest pain or palpitations. Has not smoked a cigarette since her hospitalization.   Her biggest concern today is continued fatigue which has been present for the past few months.  Says that she only sleeps 2 to 4 hours at night and was recently started on Wellbutrin by her PCP and this further decreased her sleep.  She did self discontinue the medication earlier this week due to these symptoms and worsening dizziness.  She does use CPAP at night but says her last sleep study was 17+ years ago.   Past Medical History:  Diagnosis Date  .  Anxiety   . Aortic stenosis    a. Mild by 2D ECHO 11/17/14  . CAD (coronary artery disease)    a. 10/2014: Ant STEMI s/p DES to dLAD  . Carotid disease, bilateral ([place])    a. Duplex 12/2014: mild-mod atherosclerotic plaque without hemodynamically significant stenosis.  [name] COPD (chronic obstructive pulmonary disease) ([place])   . Essential hypertension   . HLD (hyperlipidemia)   . Morbid obesity ([place])   . Myocardial infarction ([place]) 11/17/2014  . PONV (postoperative nausea and vomiting)   . Shingles   . Sleep apnea    uses CPAP  . Type 2 diabetes mellitus ([place])     Past Surgical History:  Procedure Laterality Date  . ABDOMINAL HYSTERECTOMY    . BREAST BIOPSY Left    2016  . CARDIAC CATHETERIZATION N/A 11/17/2014   Procedure: Left Heart Cath and Coronary Angiography;  Surgeon: Wellington Hampshire, MD;  Location: [facility];  Service: Cardiovascular;  Laterality: N/A;  . CARDIAC CATHETERIZATION N/A 11/17/2014   Procedure: Coronary Stent Intervention;  Surgeon: Wellington Hampshire, MD;  Location: [facility];  Service: Cardiovascular;  Laterality: N/A;  distal lad 2.25x20 promus  . CATARACT EXTRACTION W/PHACO Left 09/20/2016   Procedure: CATARACT EXTRACTION PHACO AND INTRAOCULAR LENS PLACEMENT (IOC);  Surgeon: Tonny Branch, MD;  Location: [facility];  Service: Ophthalmology;  Laterality: Left;  CDE: 9.27  .  CATARACT EXTRACTION W/PHACO Right 10/18/2016   Procedure: CATARACT EXTRACTION PHACO AND INTRAOCULAR LENS PLACEMENT (IOC);  Surgeon: Tonny Branch, MD;  Location: [facility];  Service: Ophthalmology;  Laterality: Right;  CDE: 6.38  . CHOLECYSTECTOMY    . SPINAL FUSION     cervical; screws and plates.    Current Medications: Outpatient Medications Prior to Visit  Medication Sig Dispense Refill  . acetaminophen (TYLENOL) 500 MG tablet Take 500 mg by mouth daily as needed for moderate pain or headache.    . albuterol (PROVENTIL HFA;VENTOLIN HFA) 108 (90 Base) MCG/ACT inhaler Inhale 2  puffs into the lungs every 4 (four) hours as needed for wheezing or shortness of breath. 1 Inhaler 0  . albuterol (PROVENTIL) (2.5 MG/3ML) 0.083% nebulizer solution Take 3 mLs (2.5 mg total) by nebulization every 4 (four) hours as needed for wheezing or shortness of breath. 75 mL 0  . aspirin EC 81 MG tablet Take 81 mg by mouth daily.    [name] atorvastatin (LIPITOR) 10 MG tablet TAKE 1 TABLET (10 MG TOTAL) BY MOUTH EVERY OTHER DAY. TAKE AT DINNER 45 tablet 3  . dapagliflozin propanediol (FARXIGA) 10 MG TABS tablet Take 10 mg by mouth daily.    [name] escitalopram (LEXAPRO) 20 MG tablet Take 20 mg by mouth 2 (two) times a day.    . furosemide (LASIX) 20 MG tablet Take 2 tablets (40 mg total) by mouth daily. 60 tablet 0  . hydrocortisone (ANUSOL-HC) 2.5 % rectal cream Apply 1 application topically 4 (four) times daily as needed for hemorrhoids. 30 g 0  . insulin aspart (NOVOLOG FLEXPEN) 100 UNIT/ML FlexPen Inject 2-8 Units into the skin See admin instructions. Inject 2-8 units subcutaneously three times daily before meals if CBG is over 150 per sliding meals - 150-250 2 units, >250 8 units    . Insulin Detemir (LEVEMIR FLEXTOUCH) 100 UNIT/ML Pen Inject 80 Units into the skin 2 (two) times daily.    [name] lisinopril (ZESTRIL) 10 MG tablet TAKE 1 TABLET BY MOUTH EVERY DAY (Patient taking differently: Take 10 mg by mouth daily. ) 90 tablet 3  . metFORMIN (GLUCOPHAGE) 1000 MG tablet Take 1,000 mg by mouth 2 (two) times daily.   6  . potassium chloride SA (K-DUR) 20 MEQ tablet Take 1 tablet (20 mEq total) by mouth daily. 30 tablet 0  . Vitamin D, Ergocalciferol, (DRISDOL) 50000 units CAPS capsule Take 50,000 Units by mouth every Saturday.  12  . metoprolol tartrate (LOPRESSOR) 25 MG tablet TAKE 1/2 TABLET BY MOUTH TWICE A DAY (Patient taking differently: Take 12.5 mg by mouth 2 (two) times daily. ) 90 tablet 3   No facility-administered medications prior to visit.      Allergies:   Codeine and Sulfa antibiotics    Social History   Socioeconomic History  . Marital status: Widowed    Spouse name: Not on file  . Number of children: Not on file  . Years of education: Not on file  . Highest education level: Not on file  Occupational History  . Occupation: Marketing executive work for [employer]  . Financial resource strain: Not on file  . Food insecurity    Worry: Not on file    Inability: Not on file  . Transportation needs    Medical: Not on file    Non-medical: Not on file  Tobacco Use  . Smoking status: Current Some Day Smoker    Packs/day: 0.25    Years: 30.00  Pack years: 7.50    Types: Cigarettes    Start date: 12/26/1972  . Smokeless tobacco: Never Used  . Tobacco comment: 3 cigs per day  Substance and Sexual Activity  . Alcohol use: No    Alcohol/week: 0.0 standard drinks  . Drug use: No  . Sexual activity: Yes    Partners: Male    Birth control/protection: Surgical  Lifestyle  . Physical activity    Days per week: Not on file    Minutes per session: Not on file  . Stress: Not on file  Relationships  . Social Herbalist on phone: Not on file    Gets together: Not on file    Attends religious service: Not on file    Active member of club or organization: Not on file    Attends meetings of clubs or organizations: Not on file    Relationship status: Not on file  Other Topics Concern  . Not on file  Social History Narrative  . Not on file     Family History:  The patient's family history includes Cancer in her brother; Heart attack in her father; Heart disease in her father.   Review of Systems:   Please see the history of present illness.     General:  No chills, fever, night sweats or weight changes. Positive for fatigue.  Cardiovascular:  No chest pain, dyspnea on exertion, edema, orthopnea, palpitations, paroxysmal nocturnal dyspnea. Dermatological: No rash, lesions/masses Respiratory: No cough, dyspnea Urologic: No hematuria, dysuria  Abdominal:   No nausea, vomiting, diarrhea, bright red blood per rectum, melena, or hematemesis Neurologic:  No visual changes, wkns, changes in mental status. All other systems reviewed and are otherwise negative except as noted above.   Physical Exam:    VS:  BP (!) 110/59   Pulse 88   Temp 97.8 F (36.6 C)   Ht 5\' 4"  (1.626 m)   Wt 246 lb (111.6 kg)   SpO2 97%   BMI 42.23 kg/m    General: Well developed, well nourished,female appearing in no acute distress. Head: Normocephalic, atraumatic, sclera non-icteric, no xanthomas, nares are without discharge.  Neck: No carotid bruits. JVD not elevated.  Lungs: Respirations regular and unlabored, without wheezes or rales.  Heart: Regular rate and rhythm. No S3 or S4.  No murmur, no rubs, or gallops appreciated. Abdomen: Soft, non-tender, non-distended with normoactive bowel sounds. No hepatomegaly. No rebound/guarding. No obvious abdominal masses. Msk:  Strength and tone appear normal for age. No joint deformities or effusions. Extremities: No clubbing or cyanosis. No lower extremity edema.  Distal pedal pulses are 2+ bilaterally. Neuro: Alert and oriented X 3. Moves all extremities spontaneously. No focal deficits noted. Psych:  Responds to questions appropriately with a normal affect. Skin: No rashes or lesions noted  Wt Readings from Last 3 Encounters:  09/27/18 246 lb (111.6 kg)  08/29/18 256 lb 4.8 oz (116.3 kg)  08/25/18 270 lb 15.1 oz (122.9 kg)     Studies/Labs Reviewed:   EKG:  EKG is not ordered today.   Recent Labs: 08/25/2018: ALT 32 08/27/2018: B Natriuretic Peptide 188.3; Hemoglobin 13.1; Platelets 227 08/28/2018: Magnesium 1.7 09/20/2018: BUN 12; Creatinine, Ser 0.85; Potassium 4.1; Sodium 135   Lipid Panel    Component Value Date/Time   CHOL 107 (L) 11/07/2015 1037   TRIG 154 (H) 11/07/2015 1037   HDL 38 (L) 11/07/2015 1037   CHOLHDL 2.8 11/07/2015 1037   VLDL 31 (H) 11/07/2015 1037  [place] 38 11/07/2015  1037    Additional studies/ records that were reviewed today include:   Echocardiogram: 08/28/2018 IMPRESSIONS    1. The left ventricle has normal systolic function with an ejection fraction of 60-65%. The cavity size was normal. There is mildly increased left ventricular wall thickness. Left ventricular diastolic Doppler parameters are consistent with  pseudonormalization. No evidence of left ventricular regional wall motion abnormalities.  2. The right ventricle has mildly reduced systolic function. The cavity was mildly enlarged. There is no increase in right ventricular wall thickness. D-shaped interventricular septum suggests RV pressure/volume overload.  3. No evidence of mitral valve stenosis. Trivial mitral regurgitation.  4. The aortic valve is tricuspid. Moderate calcification of the aortic valve. Mild stenosis of the aortic valve.  5. The aorta is normal in size and structure.  6. The aortic root is normal in size and structure.  7. The inferior vena cava was dilated in size with <50% respiratory variability. PA systolic pressure 57 mmHg.  NST: 09/2017  T wave inversion was noted during stress in the II, III, aVF, V5 and V6 leads after lexiscan injection. Overall nonspecific finding  The study is normal. There are no perfusion defects consistent with prior infarct or current ischemia.  This is a low risk study.  The left ventricular ejection fraction is normal (55-65%).     Assessment:    1. Coronary artery disease involving native coronary artery of native heart without angina pectoris   2. Fatigue, unspecified type   3. Diastolic dysfunction   4. Essential hypertension   5. Aortic stenosis, mild   6. OSA on CPAP      Plan:   In order of problems listed above:  1. CAD/Fatigue - she is s/p DES to LAD in 2016 with low-risk NST last year as outlined above. Recent echo showed a preserved EF of 60-65% with no regional WMA.  - she denies any recent chest pain  or dyspnea on exertion. She has been experiencing fatigue for several months but says this feels different from her prior angina. Recent labs showed no acute findings. Reports this worsens when she takes Lopressor. Will transition to Toprol-XL for sustained release to see if this helps with her symptoms. Also recommended repeat sleep study as outlined below. - continue ASA and statin therapy.   2. Diastolic Dysfunction - Her dyspnea has improved and is close to baseline. Weight has declined by 14 pounds within the past month and she appears close to baseline by examination today. - recent labs showed stable renal function and electrolytes. Continue Lasix 40mg  daily.    3. HTN - BP well-controlled at 110/59 during today's visit. Continue Lisinopril 10mg  daily with transition from Lopressor to Toprol-XL as outlined above.   4. Aortic Stenosis - mild by echocardiogram last month. Continue to follow.   5. OSA - continued compliance with CPAP encouraged. It has been 17+ years since her last sleep study. Will refer for consideration of repeat study given her fatigue and daytime somnolence.    Medication Adjustments/Labs and Tests Ordered: Current medicines are reviewed at length with the patient today.  Concerns regarding medicines are outlined above.  Medication changes, Labs and Tests ordered today are listed in the Patient Instructions below. Patient Instructions  Medication Instructions:  Your physician has recommended you make the following change in your medication:  Stop Taking Lopressor  Start Taking Toprol XL 25 mg Daily   If you need a refill on your cardiac medications before  your next appointment, please call your pharmacy.   Lab work: NONE  If you have labs (blood work) drawn today and your tests are completely normal, you will receive your results only by: [name] MyChart Message (if you have MyChart) OR . A paper copy in the mail If you have any lab test that is abnormal or we need  to change your treatment, we will call you to review the results.  Testing/Procedures: NONE   Follow-Up: At [facility], you and your health needs are our priority.  As part of our continuing mission to provide you with exceptional heart care, we have created designated Provider Care Teams.  These Care Teams include your primary Cardiologist (physician) and Advanced Practice Providers (APPs -  Physician Assistants and Nurse Practitioners) who all work together to provide you with the care you need, when you need it. You will need a follow up appointment in 6 months.  Please call our office 2 months in advance to schedule this appointment.  You may see Rozann Lesches, MD or one of the following Advanced Practice Providers on your designated Care Team:   Bernerd Pho, PA-C [facility]) . Ermalinda Barrios, PA-C ([place])  Any Other Special Instructions Will Be Listed Below (If Applicable). Thank you for choosing [facility]!     Signed, Erma Heritage, PA-C  09/27/2018 9:21 PM    [place] [address] [place], [place] [address] Phone: [phone] Fax: [phone]

## 2018-09-27 NOTE — Patient Instructions (Signed)
Medication Instructions:  Your physician has recommended you make the following change in your medication:  Stop Taking Lopressor  Start Taking Toprol XL 25 mg Daily   If you need a refill on your cardiac medications before your next appointment, please call your pharmacy.   Lab work: NONE  If you have labs (blood work) drawn today and your tests are completely normal, you will receive your results only by: [name] MyChart Message (if you have MyChart) OR . A paper copy in the mail If you have any lab test that is abnormal or we need to change your treatment, we will call you to review the results.  Testing/Procedures: NONE   Follow-Up: At [facility], you and your health needs are our priority.  As part of our continuing mission to provide you with exceptional heart care, we have created designated Provider Care Teams.  These Care Teams include your primary Cardiologist (physician) and Advanced Practice Providers (APPs -  Physician Assistants and Nurse Practitioners) who all work together to provide you with the care you need, when you need it. You will need a follow up appointment in 6 months.  Please call our office 2 months in advance to schedule this appointment.  You may see Rozann Lesches, MD or one of the following Advanced Practice Providers on your designated Care Team:   Bernerd Pho, PA-C [facility]) . Ermalinda Barrios, PA-C ([place])  Any Other Special Instructions Will Be Listed Below (If Applicable). Thank you for choosing [facility]!

## 2018-10-17 ENCOUNTER — Ambulatory Visit (INDEPENDENT_AMBULATORY_CARE_PROVIDER_SITE_OTHER): Payer: BC Managed Care – PPO | Admitting: Neurology

## 2018-10-17 ENCOUNTER — Encounter: Payer: Self-pay | Admitting: Neurology

## 2018-10-17 ENCOUNTER — Other Ambulatory Visit: Payer: Self-pay

## 2018-10-17 VITALS — BP 151/75 | HR 81 | Ht 64.0 in | Wt 235.0 lb

## 2018-10-17 DIAGNOSIS — J449 Chronic obstructive pulmonary disease, unspecified: Secondary | ICD-10-CM

## 2018-10-17 DIAGNOSIS — G4719 Other hypersomnia: Secondary | ICD-10-CM

## 2018-10-17 DIAGNOSIS — G4733 Obstructive sleep apnea (adult) (pediatric): Secondary | ICD-10-CM

## 2018-10-17 DIAGNOSIS — F17200 Nicotine dependence, unspecified, uncomplicated: Secondary | ICD-10-CM

## 2018-10-17 DIAGNOSIS — Z955 Presence of coronary angioplasty implant and graft: Secondary | ICD-10-CM

## 2018-10-17 DIAGNOSIS — Z6841 Body Mass Index (BMI) 40.0 and over, adult: Secondary | ICD-10-CM

## 2018-10-17 DIAGNOSIS — Z9989 Dependence on other enabling machines and devices: Secondary | ICD-10-CM

## 2018-10-17 NOTE — Patient Instructions (Signed)

## 2018-10-17 NOTE — Progress Notes (Addendum)
Subjective:    Patient ID: [name] is a 63 y.o. female.  HPI     Star Age, MD, PhD [facility] [address], [address] [place], [place] [address]  Dear [name], I saw your patient, [name], upon your kind request to my sleep clinic today for initial consultation of her sleep disorder, in particular, evaluation of her prior diagnosis of obstructive sleep apnea.  The patient is unaccompanied today.  As you know, [name] is a 63 year old right-handed woman with an underlying medical history of coronary artery disease with history of STEMI, status post coronary stent placement, carotid artery disease, COPD, hypertension, hyperlipidemia, diabetes, aortic stenosis, anxiety and morbid obesity with a BMI of over 40, who was previously diagnosed with obstructive sleep apnea and placed on CPAP therapy.  Prior sleep study results are not available for my review today.  Sleep study testing was over 17 years ago.  I reviewed your office note from 09/27/2018.  Her Epworth sleepiness score is 10 out of 24, fatigue severity score is 38 out of 63.  She is compliant with her CPAP, she has an S9 Elite, I was able to review her compliance data from 09/18/2018 through 10/17/2018 which is a total of 30 days, during which time she used her machine every night with full compliance, average usage of 9 hours and 40 minutes, residual AHI at goal at 1.4/h, leak on the low side with a 95th percentile at 3 L/min on a pressure of 12 cm with EPR of 3. She reports that she has been on supplemental oxygen at 1 L/min since she started CPAP therapy, prescribed by Dr. Redmond Pulling, ENT.  Of note, she was recently hospitalized August with acute diastolic congestive heart failure and acute hypoxic respiratory failure. She reports that her daytime sleepiness has been increasing over the past few years.  She is fully compliant with her CPAP.  In fact, she reports that she cannot sleep without it.   She has a somewhat variable sleep schedule.  On the weekends she tries to catch up and sleep longer, up to 10 or 12 hours even on weekend nights.  She has to be at work early.  She works Monday through Fridays, generally from 5 AM to around 3 PM.  Bedtime is around 8 and rise time between 230 and 3 AM.  She does not have night to night nocturia, has occasional morning headaches.  She is trying to quit smoking, smokes about half a pack per day, currently no alcohol and caffeine and limitation, 1 cup in the morning typically.  She has a TV in her bedroom but does not watch it at night.  She has a small dog which does not sleep on the bed with her.  She is widowed for about 6 years.  She has a grown daughter who lives about 15 miles away.  She has no family history of sleep apnea.  She had a tonsillectomy at age 64. Her Past Medical History Is Significant For: Past Medical History:  Diagnosis Date  . Anxiety   . Aortic stenosis    a. Mild by 2D ECHO 11/17/14  . CAD (coronary artery disease)    a. 10/2014: Ant STEMI s/p DES to dLAD  . Carotid disease, bilateral ([place])    a. Duplex 12/2014: mild-mod atherosclerotic plaque without hemodynamically significant stenosis.  [name] COPD (chronic obstructive pulmonary disease) ([place])   . Essential hypertension   . HLD (hyperlipidemia)   .  Morbid obesity ([place])   . Myocardial infarction ([place]) 11/17/2014  . PONV (postoperative nausea and vomiting)   . Shingles   . Sleep apnea    uses CPAP  . Type 2 diabetes mellitus ([place])     Her Past Surgical History Is Significant For: Past Surgical History:  Procedure Laterality Date  . ABDOMINAL HYSTERECTOMY    . BREAST BIOPSY Left    2016  . CARDIAC CATHETERIZATION N/A 11/17/2014   Procedure: Left Heart Cath and Coronary Angiography;  Surgeon: Wellington Hampshire, MD;  Location: [facility];  Service: Cardiovascular;  Laterality: N/A;  . CARDIAC CATHETERIZATION N/A 11/17/2014   Procedure: Coronary Stent  Intervention;  Surgeon: Wellington Hampshire, MD;  Location: [place] [facility];  Service: Cardiovascular;  Laterality: N/A;  distal lad 2.25x20 promus  . CATARACT EXTRACTION W/PHACO Left 09/20/2016   Procedure: CATARACT EXTRACTION PHACO AND INTRAOCULAR LENS PLACEMENT (IOC);  Surgeon: Tonny Branch, MD;  Location: [facility];  Service: Ophthalmology;  Laterality: Left;  CDE: 9.27  . CATARACT EXTRACTION W/PHACO Right 10/18/2016   Procedure: CATARACT EXTRACTION PHACO AND INTRAOCULAR LENS PLACEMENT (IOC);  Surgeon: Tonny Branch, MD;  Location: [facility];  Service: Ophthalmology;  Laterality: Right;  CDE: 6.38  . CHOLECYSTECTOMY    . SPINAL FUSION     cervical; screws and plates.    Her Family History Is Significant For: Family History  Problem Relation Age of Onset  . Heart attack Father   . Heart disease Father   . Cancer Brother     Her Social History Is Significant For: Social History   Socioeconomic History  . Marital status: Widowed    Spouse name: Not on file  . Number of children: Not on file  . Years of education: Not on file  . Highest education level: Not on file  Occupational History  . Occupation: Marketing executive work for [employer]  . Financial resource strain: Not on file  . Food insecurity    Worry: Not on file    Inability: Not on file  . Transportation needs    Medical: Not on file    Non-medical: Not on file  Tobacco Use  . Smoking status: Current Some Day Smoker    Packs/day: 0.25    Years: 30.00    Pack years: 7.50    Types: Cigarettes    Start date: 12/26/1972  . Smokeless tobacco: Never Used  . Tobacco comment: 3 cigs per day  Substance and Sexual Activity  . Alcohol use: No    Alcohol/week: 0.0 standard drinks  . Drug use: No  . Sexual activity: Yes    Partners: Male    Birth control/protection: Surgical  Lifestyle  . Physical activity    Days per week: Not on file    Minutes per session: Not on file  . Stress: Not on file  Relationships   . Social Herbalist on phone: Not on file    Gets together: Not on file    Attends religious service: Not on file    Active member of club or organization: Not on file    Attends meetings of clubs or organizations: Not on file    Relationship status: Not on file  Other Topics Concern  . Not on file  Social History Narrative  . Not on file    Her Allergies Are:  Allergies  Allergen Reactions  . Codeine Nausea And Vomiting  . Sulfa Antibiotics Rash  :  Her Current Medications Are:  Outpatient Encounter Medications as of 10/17/2018  Medication Sig  . acetaminophen (TYLENOL) 500 MG tablet Take 500 mg by mouth daily as needed for moderate pain or headache.  . albuterol (PROVENTIL HFA;VENTOLIN HFA) 108 (90 Base) MCG/ACT inhaler Inhale 2 puffs into the lungs every 4 (four) hours as needed for wheezing or shortness of breath.  [name] albuterol (PROVENTIL) (2.5 MG/3ML) 0.083% nebulizer solution Take 3 mLs (2.5 mg total) by nebulization every 4 (four) hours as needed for wheezing or shortness of breath.  [name] aspirin EC 81 MG tablet Take 81 mg by mouth daily.  [name] atorvastatin (LIPITOR) 10 MG tablet TAKE 1 TABLET (10 MG TOTAL) BY MOUTH EVERY OTHER DAY. TAKE AT DINNER  . dapagliflozin propanediol (FARXIGA) 10 MG TABS tablet Take 10 mg by mouth daily.  [name] escitalopram (LEXAPRO) 20 MG tablet Take 20 mg by mouth 2 (two) times a day.  . insulin aspart (NOVOLOG FLEXPEN) 100 UNIT/ML FlexPen Inject 2-8 Units into the skin See admin instructions. Inject 2-8 units subcutaneously three times daily before meals if CBG is over 150 per sliding meals - 150-250 2 units, >250 8 units  . Insulin Detemir (LEVEMIR FLEXTOUCH) 100 UNIT/ML Pen Inject 80 Units into the skin 2 (two) times daily.  [name] lisinopril (ZESTRIL) 10 MG tablet TAKE 1 TABLET BY MOUTH EVERY DAY (Patient taking differently: Take 10 mg by mouth daily. )  . metFORMIN (GLUCOPHAGE) 1000 MG tablet Take 1,000 mg by mouth 2 (two) times daily.   .  metoprolol succinate (TOPROL XL) 25 MG 24 hr tablet Take 1 tablet (25 mg total) by mouth daily.  . Vitamin D, Ergocalciferol, (DRISDOL) 50000 units CAPS capsule Take 50,000 Units by mouth every Saturday.  . furosemide (LASIX) 20 MG tablet Take 2 tablets (40 mg total) by mouth daily.  . potassium chloride SA (K-DUR) 20 MEQ tablet Take 1 tablet (20 mEq total) by mouth daily.  . [DISCONTINUED] hydrocortisone (ANUSOL-HC) 2.5 % rectal cream Apply 1 application topically 4 (four) times daily as needed for hemorrhoids.   No facility-administered encounter medications on file as of 10/17/2018.   :  Review of Systems:  Out of a complete 14 point review of systems, all are reviewed and negative with the exception of these symptoms as listed below: Review of Systems  Neurological:       Pt presents today to discuss her sleep. Pt has been using cpap for over 15 years and her current machine is over 59 years old. She does not have a DME. Pt's last sleep study was also over 15 years ago. Pt does endorse snoring. Pt reports that she has 1L of O2 bled into her cpap.  Epworth Sleepiness Scale 0= would never doze 1= slight chance of dozing 2= moderate chance of dozing 3= high chance of dozing  Sitting and reading: 2 Watching TV: 2 Sitting inactive in a public place (ex. Theater or meeting): 3 As a passenger in a car for an hour without a break: 0 Lying down to rest in the afternoon: 3 Sitting and talking to someone: 0 Sitting quietly after lunch (no alcohol): 0 In a car, while stopped in traffic: 0 Total: 10     Objective:  Neurological Exam  Physical Exam Physical Examination:   Vitals:   10/17/18 1536  BP: (!) 151/75  Pulse: 81    General Examination: The patient is a very pleasant 63 y.o. female in no acute distress. She appears well-developed and well-nourished and  well groomed.   HEENT: Normocephalic, atraumatic, pupils are equal, Aguilar and reactive to light and accommodation.  Funduscopic exam is normal with sharp disc margins noted. Extraocular tracking is good without limitation to gaze excursion or nystagmus noted. Normal smooth pursuit is noted. Hearing is grossly intact. Tympanic membranes are clear bilaterally. Face is symmetric with normal facial animation and normal facial sensation. Speech is clear with no dysarthria noted. There is no hypophonia. There is no lip, neck/head, jaw or voice tremor. Neck is supple with full range of passive and active motion. There are no carotid bruits on auscultation. Oropharynx exam reveals: moderate mouth dryness, adequate dental hygiene With dentures on top and some missing teeth on the bottom.  She has a moderately crowded airway, Mallampati is class II, elongated uvula noted, tongue protrudes centrally in palate elevates symmetrically.  Tonsils are absent.  She has obvious changes in keeping with rosacea affecting her nose and lateral face b/l. Neck circumference is 16-1/2 inches. Unremarkable anterior neck scar from prior neck surgery.  Chest: Clear to auscultation without wheezing, rhonchi or crackles noted.  Heart: S1+S2+0, regular and normal without murmurs, rubs or gallops noted.   Abdomen: Soft, non-tender and non-distended with normal bowel sounds appreciated on auscultation.  Extremities: There is no pitting edema in the distal lower extremities bilaterally. Knee-high compression socks in place bilaterally.  Skin: Warm and dry without trophic changes noted. Rosacea.  Musculoskeletal: exam reveals no obvious joint deformities, tenderness or joint swelling or erythema.   Neurologically:  Mental status: The patient is awake, alert and oriented in all 4 spheres. Her immediate and remote memory, attention, language skills and fund of knowledge are appropriate. There is no evidence of aphasia, agnosia, apraxia or anomia. Speech is clear with normal prosody and enunciation. Thought process is linear. Mood is normal and affect  is normal.  Cranial nerves II - XII are as described above under HEENT exam. In addition: shoulder shrug is normal with equal shoulder height noted. Motor exam: Normal bulk, strength and tone is noted. There is no tremor. Fine motor skills and coordination: grossly intact.  Cerebellar testing: No dysmetria or intention tremor on finger to nose testing. Heel to shin is unremarkable bilaterally. There is no truncal or gait ataxia.  Sensory exam: intact to light touch in the upper and lower extremities.  Gait, station and balance: She stands easily. No veering to one side is noted. No leaning to one side is noted. Posture is age-appropriate and stance is narrow based. Gait shows normal stride length and normal pace. No problems turning are noted.  Assessment and Plan:   In summary, DAYELIN FAGLEY is a very pleasant 63 y.o.-year old female  with an underlying medical history of coronary artery disease with history of STEMI, status post coronary stent placement, carotid artery disease, COPD, hypertension, hyperlipidemia, diabetes, aortic stenosis, anxiety and morbid obesity with a BMI of over 47, who Presents for evaluation of her obstructive sleep apnea.  She had sleep study testing over 15 years ago and her machine is the original machine.  She also reports being on supplemental oxygen.  I suggested we proceed with a diagnostic evaluation since she has not had a retest and many years.  We will call her to schedule her sleep study.  She is advised to continue to be fully compliant with her current machine.  She is commended for her treatment adherence.  She is strongly advised to quit smoking as she has heart disease and recent  diagnosis of acute congestive heart failure.  In addition, she had hypoxia.  She is advised to try to pursue weight loss as best as possible.  She is furthermore advised to try to keep a more set schedule rather than such a variable schedule.  She sleeps a lot more on weekend nights, up to  12 hours from what I can see.  She also reports that she tries to catch up on the weekend for last sleep during the workweek. This may be a contributor to her daytime tiredness.  From the sleep apnea standpoint, her CPAP of 12 cm results in good apnea reduction.  Of course we will have to see if her oxygen levels are appropriate and therefore sleep study testing is the next step.  I plan to see her back after her sleep study.  I answered all her questions today and she was in agreement.  Thank you very much for allowing me to participate in the care of this nice patient. If I can be of any further assistance to you please do not hesitate to call me at [phone].  Sincerely,   Star Age, MD, PhD

## 2018-11-01 ENCOUNTER — Ambulatory Visit (INDEPENDENT_AMBULATORY_CARE_PROVIDER_SITE_OTHER): Payer: BC Managed Care – PPO | Admitting: Neurology

## 2018-11-01 ENCOUNTER — Other Ambulatory Visit: Payer: Self-pay

## 2018-11-01 DIAGNOSIS — J449 Chronic obstructive pulmonary disease, unspecified: Secondary | ICD-10-CM

## 2018-11-01 DIAGNOSIS — G4719 Other hypersomnia: Secondary | ICD-10-CM

## 2018-11-01 DIAGNOSIS — G4733 Obstructive sleep apnea (adult) (pediatric): Secondary | ICD-10-CM | POA: Diagnosis not present

## 2018-11-01 DIAGNOSIS — Z9989 Dependence on other enabling machines and devices: Secondary | ICD-10-CM

## 2018-11-01 DIAGNOSIS — G4720 Circadian rhythm sleep disorder, unspecified type: Secondary | ICD-10-CM

## 2018-11-01 DIAGNOSIS — Z9981 Dependence on supplemental oxygen: Secondary | ICD-10-CM

## 2018-11-01 DIAGNOSIS — G4734 Idiopathic sleep related nonobstructive alveolar hypoventilation: Secondary | ICD-10-CM

## 2018-11-01 DIAGNOSIS — F17200 Nicotine dependence, unspecified, uncomplicated: Secondary | ICD-10-CM

## 2018-11-01 DIAGNOSIS — Z955 Presence of coronary angioplasty implant and graft: Secondary | ICD-10-CM

## 2018-11-08 ENCOUNTER — Telehealth: Payer: Self-pay

## 2018-11-08 NOTE — Progress Notes (Signed)
Patient referred by Bernerd Pho, PA in cardiology, seen by me on 10/17/18, diagnostic PSG on 11/01/18 for re-eval of OSA.   Please call and notify the patient that the recent sleep study showed lower baseline oxygen saturations, while awake and asleep and need for supplemental oxygen. The study showed severe obstructive sleep apnea, with a total AHI of 75.3/hour, and O2 nadir of 82% (associated with hypopnea, during non-supine NREM sleep and O2 at 2 lpm). The absence of REM sleep likely underestimated her sleep disordered breathing. Treatment with positive airway pressure in the form of CPAP is recommended. This will require a full night titration study to optimize therapy; she may require supplemental oxygen. Please advise her to talk to her primary care physician about seeing a pulmonologist, given her history of smoking, COPD and oxygen dependence at night. I have placed an order for PAP titration in the chart. Thanks.  Star Age, MD, PhD [facility])

## 2018-11-08 NOTE — Telephone Encounter (Signed)
I called pt. I advised pt that Dr. Rexene Alberts reviewed their sleep study results and found that pt has severe osa with lower O2 saturations and recommends that pt be treated with a cpap. Dr. Rexene Alberts recommends that pt return for a repeat sleep study in order to properly titrate the cpap and ensure a good mask fit. Pt is agreeable to returning for a titration study. I advised pt that our sleep lab will file with pt's insurance and call pt to schedule the sleep study when we hear back from the pt's insurance regarding coverage of this sleep study. I also recommended that pt discuss with her PCP the need for a pulmonologist given her lower O2 saturations. Pt verbalized understanding of results. Pt had no questions at this time but was encouraged to call back if questions arise.

## 2018-11-08 NOTE — Addendum Note (Signed)
Addended by: Star Age on: 11/08/2018 10:29 AM   Modules accepted: Orders

## 2018-11-08 NOTE — Telephone Encounter (Signed)
-----   Message from Star Age, MD sent at 11/08/2018 10:29 AM EDT ----- Patient referred by Bernerd Pho, [place] in cardiology, seen by me on 10/17/18, diagnostic PSG on 11/01/18 for re-eval of OSA.   Please call and notify the patient that the recent sleep study showed lower baseline oxygen saturations, while awake and asleep and need for supplemental oxygen. The study showed severe obstructive sleep apnea, with a total AHI of 75.3/hour, and O2 nadir of 82% (associated with hypopnea, during non-supine NREM sleep and O2 at 2 lpm). The absence of REM sleep likely underestimated her sleep disordered breathing. Treatment with positive airway pressure in the form of CPAP is recommended. This will require a full night titration study to optimize therapy; she may require supplemental oxygen. Please advise her to talk to her primary care physician about seeing a pulmonologist, given her history of smoking, COPD and oxygen dependence at night. I have placed an order for PAP titration in the chart. Thanks.  Star Age, MD, PhD [facility])

## 2018-11-08 NOTE — Procedures (Signed)
PATIENT'S NAME:  [name], [place] DOB:      1955/04/06      MR#:    [id number]     DATE OF RECORDING: 11/01/2018 REFERRING M.D.:  Bernerd Pho, [place] Study Performed:   Baseline Polysomnogram HISTORY: 63 year old woman with a history of coronary artery disease with history of STEMI, s/p coronary stent placement, carotid artery disease, COPD, hypertension, hyperlipidemia, diabetes, aortic stenosis, anxiety and morbid obesity with a BMI of over 40, who was previously diagnosed with obstructive sleep apnea and placed on CPAP therapy. She presents for re-evaluation of her OSA. She has been on supplemental oxygen at 1 L/min since she started CPAP therapy. The patient endorsed the Epworth Sleepiness Scale at 10 points. The patient's weight 235 pounds with a height of 64 (inches), resulting in a BMI of 40.3 kg/m2. The patient's neck circumference measured 16.5 inches.  CURRENT MEDICATIONS: Tylenol, Proventil, Ventolin, ASA 81mg , Lipitor, Farxiga, Lexapro, Novolog flexpen, Lisinopril, Glucophage, Toprol XL, Vitamin D, Lasix, K-Dur   PROCEDURE:  This is a multichannel digital polysomnogram utilizing the Somnostar 11.2 system.  Electrodes and sensors were applied and monitored per AASM Specifications.   EEG, EOG, Chin and Limb EMG, were sampled at 200 Hz.  ECG, Snore and Nasal Pressure, Thermal Airflow, Respiratory Effort, CPAP Flow and Pressure, Oximetry was sampled at 50 Hz. Digital video and audio were recorded.      BASELINE STUDY  Lights Out was at 20:42 and Lights On at 03:02, the patient requested to end the study after the required 6 hours of testing time, as she indicated not being able to sleep without her CPAP and wanted to go home and sleep with her machine on. Total recording time (TRT) was 380 minutes, with a total sleep time (TST) of 296.5 minutes.   The patient's sleep latency was 34 minutes, which is delayed. REM latency was absent. The sleep efficiency was 78%.     SLEEP ARCHITECTURE: WASO (Wake  after sleep onset) was 46.5 minutes.  There were 51.5 minutes in Stage N1, 232 minutes Stage N2, 13 minutes Stage N3 and 0 minutes in Stage REM.  The percentage of Stage N1 was 17.4%, which is markedly increased, Stage N2 was 78.2%, which is markedly increased, Stage N3 was 4.4% and Stage R (REM sleep) was absent.   RESPIRATORY ANALYSIS:  There were a total of 372 respiratory events:  0 obstructive apneas, 0 central apneas and 0 mixed apneas with a total of 0 apneas and an apnea index (AI) of 0 /hour. There were 372 hypopneas with a hypopnea index of 75.3 /hour. The patient also had 0 respiratory event related arousals (RERAs).      The total APNEA/HYPOPNEA INDEX (AHI) was 75.3 /hour and the total RESPIRATORY DISTURBANCE INDEX was 0. 75.3 /hour.  0 events occurred in REM sleep and 744 events in NREM. The REM AHI was n/a, versus a non-REM AHI of 75.3. The patient spent 12 minutes of total sleep time in the supine position and 285 minutes in non-supine.. The supine AHI was 60.0 versus a non-supine AHI of 75.9.  OXYGEN SATURATION & C02:  The Wake baseline 02 saturation was 84%, even prior to falling asleep. The study was started without supplemental O2 and the patient sleeps at home with CPAP and O2 at 1 lpm. The O2 saturation ranged between 82% and 85%, therefore, the technologist added supplemental O2 at 1 lpm at 20:56, prior sleep onset. After this, the O2 saturations were around 86-87%; therefore, at 91:13, the  O2 was increased to 2 lpm, again, prior to sleep onset. After that, her oxygen saturation was around 91% at baseline, less with respiratory events. O2 nadir was 81% (with hypopnea, during non-supine NREM sleep and O2 at 2 lpm).  PERIODIC LIMB MOVEMENTS: The patient had a total of 0 Periodic Limb Movements.  The Periodic Limb Movement (PLM) index was 0 and the PLM Arousal index was 0/hour. The arousals were noted as: 45 were spontaneous, 0 were associated with PLMs, 324 were associated with  respiratory events.  Audio and video analysis did not show any abnormal or unusual movements, behaviors, phonations or vocalizations. The patient took no bathroom breaks. Mild snoring was noted. The EKG was in keeping with normal sinus rhythm (NSR).  Post-study, the patient indicated that sleep was worse than usual.   IMPRESSION:  1. Severe Obstructive Sleep Apnea (OSA) 2. Nocturnal hypoxemia, oxygen dependent 3. Dysfunctions associated with sleep stages or arousal from sleep  RECOMMENDATIONS:  1. This study demonstrates lower baseline oxygen saturations, while awake and asleep and need for supplemental oxygen. The study showed severe obstructive sleep apnea, with a total AHI of 75.3/hour, and O2 nadir of 82% (associated with hypopnea, during non-supine NREM sleep and O2 at 2 lpm). The absence of REM sleep likely underestimates her sleep disordered breathing. Treatment with positive airway pressure in the form of CPAP is recommended. This will require a full night titration study to optimize therapy; she may require supplemental oxygen. She will be advised to talk to her primary care physician about seeing a pulmonologist, given her history of smoking, COPD and oxygen dependence at night. Please note that untreated obstructive sleep apnea may carry additional perioperative morbidity. Patients with significant obstructive sleep apnea should receive perioperative PAP therapy and the surgeons and particularly the anesthesiologist should be informed of the diagnosis and the severity of the sleep disordered breathing. 2. This study shows sleep fragmentation and abnormal sleep stage percentages; these are nonspecific findings and per se do not signify an intrinsic sleep disorder or a cause for the patient's sleep-related symptoms. Causes include (but are not limited to) the first night effect of the sleep study, circadian rhythm disturbances, medication effect or an underlying mood disorder or medical  problem.  3. The patient should be cautioned not to drive, work at heights, or operate dangerous or heavy equipment when tired or sleepy. Review and reiteration of good sleep hygiene measures should be pursued with any patient. 4. The patient will be seen in follow-up in the sleep clinic at [facility] for discussion of the test results, symptom and treatment compliance review, further management strategies, etc. The referring provider will be notified of the test results.  I certify that I have reviewed the entire raw data recording prior to the issuance of this report in accordance with the Standards of Accreditation of the American Academy of Sleep Medicine (AASM)    Star Age, MD, PhD Diplomat, American Board of Neurology and Sleep Medicine (Neurology and Sleep Medicine)

## 2018-11-24 NOTE — Telephone Encounter (Signed)
Pt called wanting to know the update on the repeat sleep study. Please advise.

## 2018-12-08 ENCOUNTER — Other Ambulatory Visit (HOSPITAL_COMMUNITY)
Admission: RE | Admit: 2018-12-08 | Discharge: 2018-12-08 | Disposition: A | Discharge status: Home / Self Care | Payer: BC Managed Care – PPO | Source: Ambulatory Visit | Attending: Neurology | Admitting: Neurology

## 2018-12-08 ENCOUNTER — Other Ambulatory Visit: Payer: Self-pay

## 2018-12-08 DIAGNOSIS — Z20828 Contact with and (suspected) exposure to other viral communicable diseases: Secondary | ICD-10-CM | POA: Insufficient documentation

## 2018-12-08 DIAGNOSIS — Z01812 Encounter for preprocedural laboratory examination: Secondary | ICD-10-CM | POA: Insufficient documentation

## 2018-12-08 LAB — SARS CORONAVIRUS 2 (TAT 6-24 HRS): SARS Coronavirus 2: NEGATIVE

## 2018-12-13 ENCOUNTER — Ambulatory Visit (INDEPENDENT_AMBULATORY_CARE_PROVIDER_SITE_OTHER): Payer: BC Managed Care – PPO | Admitting: Neurology

## 2018-12-13 ENCOUNTER — Other Ambulatory Visit: Payer: Self-pay

## 2018-12-13 DIAGNOSIS — G4719 Other hypersomnia: Secondary | ICD-10-CM

## 2018-12-13 DIAGNOSIS — G4720 Circadian rhythm sleep disorder, unspecified type: Secondary | ICD-10-CM

## 2018-12-13 DIAGNOSIS — F17200 Nicotine dependence, unspecified, uncomplicated: Secondary | ICD-10-CM

## 2018-12-13 DIAGNOSIS — Z9981 Dependence on supplemental oxygen: Secondary | ICD-10-CM

## 2018-12-13 DIAGNOSIS — G4733 Obstructive sleep apnea (adult) (pediatric): Secondary | ICD-10-CM

## 2018-12-13 DIAGNOSIS — Z955 Presence of coronary angioplasty implant and graft: Secondary | ICD-10-CM

## 2018-12-13 DIAGNOSIS — G4734 Idiopathic sleep related nonobstructive alveolar hypoventilation: Secondary | ICD-10-CM

## 2018-12-13 DIAGNOSIS — J449 Chronic obstructive pulmonary disease, unspecified: Secondary | ICD-10-CM

## 2018-12-27 NOTE — Addendum Note (Signed)
Addended by: Star Age on: 12/27/2018 06:51 PM   Modules accepted: Orders

## 2018-12-27 NOTE — Progress Notes (Signed)
Final pressure for BiPAP was 19/14 cm, not 19/15 cm.

## 2018-12-27 NOTE — Procedures (Signed)
S PATIENT'S NAME:  [name], [name] DOB:      February 28, 1955      MR#:    [id number]     DATE OF RECORDING: 12/13/2018 REFERRING M.D.:  Bernerd Pho, [place] Study Performed:   CPAP  Titration HISTORY: 63 year old woman with a history of coronary artery disease with history of STEMI, s/p coronary stent placement, carotid artery disease, COPD, hypertension, hyperlipidemia, diabetes, aortic stenosis, anxiety and morbid obesity with a BMI of over 40, who presents for a titration study. Her baseline sleep study from 11/01/18 showed an AHI of 75.3/hour, absence of REM sleep and O2 desaturations through the night. Her awake baseline 02 saturation was 84%, even prior to falling asleep. The study was started without supplemental O2 and the patient sleeps at home with CPAP and O2 at 1 lpm. The O2 saturation ranged between 82% and 85%, therefore, the technologist added supplemental O2 at 1 lpm at 20:56, prior sleep onset. After this, the O2 saturations were around 86-87%; therefore, at 91:13, the O2 was increased to 2 lpm, again, prior to sleep onset. After that, her oxygen saturation was around 91% at baseline, less with respiratory events. O2 nadir was 81% (with hypopnea, during non-supine NREM sleep and O2 at 2 lpm). The patient endorsed the Epworth Sleepiness Scale at 10 points. The patient's weight 235 pounds with a height of 64 (inches), resulting in a BMI of 40.3 kg/m2. The patient's neck circumference measured 16.5 inches.  CURRENT MEDICATIONS: Tylenol, Albuterol, ASA 81mg , Lipitor, Farxiga, Lexapro, Novolog, Zestril, Glucophage, Toprol, Vitamin D, Lasix, K-Dur  PROCEDURE:  This is a multichannel digital polysomnogram utilizing the SomnoStar 11.2 system.  Electrodes and sensors were applied and monitored per AASM Specifications.   EEG, EOG, Chin and Limb EMG, were sampled at 200 Hz.  ECG, Snore and Nasal Pressure, Thermal Airflow, Respiratory Effort, CPAP Flow and Pressure, Oximetry was sampled at 50 Hz. Digital video  and audio were recorded.      The patient was fitted with a small Mirage FX nasal mask, but may benefit from trying a FFM at home, due to oral venting noted towards the end of the study. CPAP was initiated at 10 cmH20, as patient is used to using CPAP at home; she requested to start at 12 cm, but was advised to start at 10 cm. The pressure was advanced to 16 cm and her AHI was 30/hour. Due to higher pressure requirements, she was switched to standard BiPAP and titrated from 17/13 cm to a final pressure of 19/14 cm. Her AHI was 1.1/hour at that point, but she still had oxygen desaturations. At 01:52, epoch 623, supplemental O2 at 1 lpm bleed-in was added, due to an average O2 saturation of 88%. She had further drop in her O2 saturation to 86% during supine REM sleep and therefore, her O2 was increased to 2 lpm bleed-in with BiPAP steady at 19/15 cm. towards the end of the study, during supine REM sleep, her O2 saturation dropped to 82 and 83%, at which point her supplemental O2 was increased to 3 lpm.   Lights Out was at 20:42 and Lights On at 04:00; she requested an early rise time. Total recording time (TRT) was 438.5 minutes, with a total sleep time (TST) of 419 minutes. The patient's sleep latency was 34 minutes. REM latency was 87 minutes.  The sleep efficiency was 95.6 %.    SLEEP ARCHITECTURE: WASO (Wake after sleep onset)  was 15.5 minutes.  There were 34.5 minutes in Stage N1,  256.5 minutes Stage N2, 36 minutes Stage N3 and 92 minutes in Stage REM.  The percentage of Stage N1 was 8.2%, Stage N2 was 61.2%, which is mildly increased, Stage N3 was 8.6% and Stage R (REM sleep) was 22.%, which is normal. The arousals were noted as: 54 were spontaneous, 0 were associated with PLMs, 37 were associated with respiratory events.  RESPIRATORY ANALYSIS:  There was a total of 60 respiratory events: 0 obstructive apneas, 0 central apneas and 0 mixed apneas with a total of 0 apneas and an apnea index (AI) of 0  /hour. There were 60 hypopneas with a hypopnea index of 8.6/hour. The patient also had 0 respiratory event related arousals (RERAs).      The total APNEA/HYPOPNEA INDEX  (AHI) was 8.6 /hour and the total RESPIRATORY DISTURBANCE INDEX was 8.6 /hour  5 events occurred in REM sleep and 55 events in NREM. The REM AHI was 3.3 /hour versus a non-REM AHI of 10.1 /hour.  The patient spent 136 minutes of total sleep time in the supine position and 283 minutes in non-supine. The supine AHI was 7.9, versus a non-supine AHI of 8.9.  OXYGEN SATURATION & C02:  The baseline 02 saturation was 91%, with the lowest being 82%. Time spent below 89% saturation equaled 42 minutes.  PERIODIC LIMB MOVEMENTS:  The patient had a total of 0 Periodic Limb Movements. The Periodic Limb Movement (PLM) index was 0 and the PLM Arousal index was 0 /hour.  Audio and video analysis did not show any abnormal or unusual movements, behaviors, phonations or vocalizations. The patient took no bathroom breaks. The EKG was in keeping with normal sinus rhythm (NSR).  Post-study, the patient indicated that sleep was the same as usual.   IMPRESSION:   1. Severe Obstructive Sleep Apnea (OSA) 2. Nocturnal hypoxemia, oxygen dependent 3. Dysfunctions associated with sleep stages or arousal from sleep   RECOMMENDATIONS:   1. This study demonstrates eventual resolution of the patient's severe obstructive sleep apnea and hypoxemia with BiPAP therapy and supplemental oxygen. I will, therefore, start the patient on home BiPAP treatment at a pressure of 19/15 cm via nasal mask of choice vs. FFM, with supplemental O2 at 3 lpm bleed-in. The patient should be reminded to be fully compliant with PAP therapy to improve sleep related symptoms and decrease long term cardiovascular risks. The patient should be reminded, that it may take up to 3 months to get fully used to using PAP with all planned sleep. The earlier full compliance is achieved, the better  long term compliance tends to be. Please note that untreated obstructive sleep apnea may carry additional perioperative morbidity. Patients with significant obstructive sleep apnea should receive perioperative PAP therapy and the surgeons and particularly the anesthesiologist should be informed of the diagnosis and the severity of the sleep disordered breathing. 2. This study shows sleep fragmentation and abnormal sleep stage percentages; these are nonspecific findings and per se do not signify an intrinsic sleep disorder or a cause for the patient's sleep-related symptoms. Causes include (but are not limited to) the first night effect of the sleep study, circadian rhythm disturbances, medication effect or an underlying mood disorder or medical problem.  3. The patient should be cautioned not to drive, work at heights, or operate dangerous or heavy equipment when tired or sleepy. Review and reiteration of good sleep hygiene measures should be pursued with any patient. 4. The patient will be seen in follow-up in the sleep clinic at [facility] for  discussion of the test results, symptom and treatment compliance review, further management strategies, etc. The referring provider will be notified of the test results.   I certify that I have reviewed the entire raw data recording prior to the issuance of this report in accordance with the Standards of Accreditation of the American Academy of Sleep Medicine (AASM)       Star Age, MD, PhD Diplomat, American Board of Neurology and Sleep Medicine (Neurology and Sleep Medicine)

## 2018-12-27 NOTE — Progress Notes (Signed)
Patient referred by Bernerd Pho, [place] in cardiology, seen by me on 10/17/18, diagnostic PSG on 11/01/18 for re-eval of OSA. Patient had a CPAP titration study on 12/13/18.  Please call and inform patient that I have entered an order for treatment with positive airway pressure (PAP) treatment for obstructive sleep apnea (OSA). She eventually did well during the latest sleep study with BiPAP and 3 lmp of O2. We will, therefore, arrange for a machine for home use through a DME (durable medical equipment) company of Her choice; and I will see the patient back in follow-up in about 10 weeks. Please also explain to the patient that I will be looking out for compliance data, which can be downloaded from the machine (stored on an SD card, that is inserted in the machine) or via remote access through a modem, that is built into the machine. At the time of the followup appointment we will discuss sleep study results and how it is going with PAP treatment at home. Please advise patient to bring Her machine at the time of the first FU visit, even though this is cumbersome. Bringing the machine for every visit after that will likely not be needed, but often helps for the first visit to troubleshoot if needed. Please re-enforce the importance of compliance with treatment and the need for [place] to monitor compliance data - often an insurance requirement and actually good feedback for the patient as far as how they are doing.  Also remind patient, that any interim PAP machine or mask issues should be first addressed with the DME company, as they can often help better with technical and mask fit issues. Please ask if patient has a preference regarding DME company.  Please also make sure, the patient has a follow-up appointment with me in about 10 weeks from the setup date, thanks. May see one of our nurse practitioners if needed for proper timing of the FU appointment.  Please fax or rout report to the referring provider.  Thanks,   Star Age, MD, PhD [facility])

## 2018-12-28 ENCOUNTER — Encounter: Payer: Self-pay | Admitting: Neurology

## 2018-12-28 ENCOUNTER — Telehealth: Payer: Self-pay | Admitting: Neurology

## 2018-12-28 NOTE — Telephone Encounter (Signed)
-----   Message from Star Age, MD sent at 12/27/2018  6:51 PM EST ----- Patient referred by Bernerd Pho, [place] in cardiology, seen by me on 10/17/18, diagnostic PSG on 11/01/18 for re-eval of OSA. Patient had a CPAP titration study on 12/13/18.  Please call and inform patient that I have entered an order for treatment with positive airway pressure (PAP) treatment for obstructive sleep apnea (OSA). She eventually did well during the latest sleep study with BiPAP and 3 lmp of O2. We will, therefore, arrange for a machine for home use through a DME (durable medical equipment) company of Her choice; and I will see the patient back in follow-up in about 10 weeks. Please also explain to the patient that I will be looking out for compliance data, which can be downloaded from the machine (stored on an SD card, that is inserted in the machine) or via remote access through a modem, that is built into the machine. At the time of the followup appointment we will discuss sleep study results and how it is going with PAP treatment at home. Please advise patient to bring Her machine at the time of the first FU visit, even though this is cumbersome. Bringing the machine for every visit after that will likely not be needed, but often helps for the first visit to troubleshoot if needed. Please re-enforce the importance of compliance with treatment and the need for [place] to monitor compliance data - often an insurance requirement and actually good feedback for the patient as far as how they are doing.  Also remind patient, that any interim PAP machine or mask issues should be first addressed with the DME company, as they can often help better with technical and mask fit issues. Please ask if patient has a preference regarding DME company.  Please also make sure, the patient has a follow-up appointment with me in about 10 weeks from the setup date, thanks. May see one of our nurse practitioners if needed for proper timing of the FU  appointment.  Please fax or rout report to the referring provider. Thanks,   Star Age, MD, PhD [facility])

## 2018-12-28 NOTE — Telephone Encounter (Signed)
Attempted to call the patient to review the sleep study results. No answer and VM was full. I will send patient a mychart message asking her to call to discuss.

## 2019-01-01 NOTE — Telephone Encounter (Signed)
Pt returned call and I was able to review ssr.. I advised pt that Dr. Rexene Alberts reviewed their sleep study results and found that pt has sleep apnea treated best with BiPAP. Dr. Rexene Alberts recommends that pt starts BiPAP. I reviewed PAP compliance expectations with the pt. Pt is agreeable to starting a CPAP. I advised pt that an order will be sent to a DME, [employer], and [employer] will call the pt within about one week after they file with the pt's insurance. [employer] will show the pt how to use the machine, fit for masks, and troubleshoot the CPAP if needed. A follow up appt was made for insurance purposes with Dr. Rexene Alberts on March 4,2021 at 1pm. Pt verbalized understanding to arrive 15 minutes early and bring their CPAP. A letter with all of this information in it will be mailed to the pt as a reminder. I verified with the pt that the address we have on file is correct. Pt verbalized understanding of results. Pt had no questions at this time but was encouraged to call back if questions arise. I have sent the order to [employer] and have received confirmation that they have received the order.

## 2019-02-08 ENCOUNTER — Ambulatory Visit: Payer: BC Managed Care – PPO | Attending: Internal Medicine

## 2019-02-08 ENCOUNTER — Other Ambulatory Visit: Payer: Self-pay

## 2019-02-08 DIAGNOSIS — Z20822 Contact with and (suspected) exposure to covid-19: Secondary | ICD-10-CM | POA: Insufficient documentation

## 2019-02-10 LAB — NOVEL CORONAVIRUS, NAA: SARS-CoV-2, NAA: NOT DETECTED

## 2019-03-27 ENCOUNTER — Encounter: Payer: Self-pay | Admitting: Neurology

## 2019-03-29 ENCOUNTER — Encounter: Payer: Self-pay | Admitting: Neurology

## 2019-03-29 ENCOUNTER — Ambulatory Visit: Payer: BC Managed Care – PPO | Admitting: Neurology

## 2019-03-29 ENCOUNTER — Other Ambulatory Visit: Payer: Self-pay

## 2019-03-29 VITALS — BP 172/70 | HR 77 | Temp 95.8°F | Ht 64.0 in | Wt 223.0 lb

## 2019-03-29 DIAGNOSIS — G4734 Idiopathic sleep related nonobstructive alveolar hypoventilation: Secondary | ICD-10-CM | POA: Diagnosis not present

## 2019-03-29 DIAGNOSIS — G4733 Obstructive sleep apnea (adult) (pediatric): Secondary | ICD-10-CM

## 2019-03-29 DIAGNOSIS — Z9981 Dependence on supplemental oxygen: Secondary | ICD-10-CM | POA: Diagnosis not present

## 2019-03-29 NOTE — Patient Instructions (Signed)
Please continue using your BiPAP regularly. While your insurance requires that you use BiPAP at least 4 hours each night on 70% of the nights, I recommend, that you not skip any nights and use it throughout the night if you can. Getting used to BiPAP and staying with the treatment long term does take time and patience and discipline. Untreated obstructive sleep apnea when it is moderate to severe can have an adverse impact on cardiovascular health and raise her risk for heart disease, arrhythmias, hypertension, congestive heart failure, stroke and diabetes. Untreated obstructive sleep apnea causes sleep disruption, nonrestorative sleep, and sleep deprivation. This can have an impact on your day to day functioning and cause daytime sleepiness and impairment of cognitive function, memory loss, mood disturbance, and problems focussing. Using BiPAP regularly can improve these symptoms. You have done a great job using your BiPAP machine along with the supplemental oxygen at 3 L/min at night.  Please continue to work on weight loss, keep up the good work with your BiPAP compliance, please follow-up routinely to see one of our nurse practitioners in 6 months and hopefully yearly thereafter.

## 2019-03-29 NOTE — Progress Notes (Signed)
Subjective:    Patient ID: [name] is a 64 y.o. female.  HPI     Interim history:   Ms. [name] is a 64 year old right-handed woman with an underlying medical history of coronary artery disease with history of STEMI, status post coronary stent placement, carotid artery disease, COPD, hypertension, history of cellulitis, hyperlipidemia, diabetes, aortic stenosis, anxiety and morbid obesity with a BMI of over 67, who presents for follow-up consultation of her obstructive sleep apnea after interim testing.  The patient is unaccompanied today.  I first met her on 10/17/2018, at which time [name] reported a prior diagnosis of obstructive sleep apnea on treatment with CPAP.  [name] was on a pressure of 12 cm and had supplemental oxygen as well, at 1 lpm at the time.  [name] was advised to proceed with sleep study testing as [name] had testing many years prior.  [name] had a baseline sleep study on 11/01/2018 which indicated lower baseline oxygen saturations, while awake and asleep and need for supplemental oxygen. The study showed severe obstructive sleep apnea, with a total AHI of 75.3/hour, and O2 nadir of 82% (associated with hypopnea, during non-supine NREM sleep and O2 at 2 lpm). The absence of REM sleep likely underestimated her sleep disordered breathing.  [name] was advised to proceed with a CPAP titration study.  [name] returned on 12/13/2018. CPAP was initiated at 10 cmH20, as patient is used to using CPAP at home; [name] requested to start at 12 cm, but was advised to start at 10 cm. The pressure was advanced to 16 cm and her AHI was 30/hour. Due to higher pressure requirements, [name] was switched to standard BiPAP and titrated from 17/13 cm to a final pressure of 19/14 cm. Her AHI was 1.1/hour at that point, but [name] still had oxygen desaturations. At 01:52, epoch 623, supplemental O2 at 1 lpm bleed-in was added, due to an average O2 saturation of 88%. [name] had further drop in her O2 saturation to 86% during supine REM  sleep and therefore, her O2 was increased to 2 lpm bleed-in with BiPAP steady at 19/15 cm. towards the end of the study, during supine REM sleep, her O2 saturation dropped to 82 and 83%, at which point her supplemental O2 was increased to 3 lpm.     Today, 03/29/2019: I reviewed her BiPAP compliance data from 02/26/2019 through 03/27/2019, which is a total of 30 days, during which time [name] used her machine 28 days with percent use days greater than 4 hours at 93%, indicating excellent compliance with an average usage of 8 hours and 17 minutes for days on treatment, residual AHI at goal at 2.9/h, leak on the higher side with a 95th percentile at 26.2 L/min on a pressure of 19/14 cm.  [name] reports doing rather well with her new BiPAP machine using supplemental oxygen at night at 3 L/min without additional problems.  [name] feels improved, [name] feels that changing to the BiPAP and increasing the oxygen has really helped her, [name] feels much better rested in the mornings, her energy level is improved.  [name] is very motivated to continue with treatment.  When [name] had cellulitis last year in August, [name] lost quite a bit of weight.  [name] is motivated to continue to work on weight loss.  [name] is still debating about the Covid vaccine, has not decided yet, [name] does have an appointment with her primary care physician this month and will bring it up with him as well.   The  patient's allergies, current medications, family history, past medical history, past social history, past surgical history and problem list were reviewed and updated as appropriate.   Previously:   10/17/2018: ([name]) was previously diagnosed with obstructive sleep apnea and placed on CPAP therapy.  Prior sleep study results are not available for my review today.  Sleep study testing was over 17 years ago.  I reviewed your office note from 09/27/2018.  Her Epworth sleepiness score is 10 out of 24, fatigue severity score is 38 out of 63.  [name] is compliant with her CPAP,  [name] has an S9 Elite, I was able to review her compliance data from 09/18/2018 through 10/17/2018 which is a total of 30 days, during which time [name] used her machine every night with full compliance, average usage of 9 hours and 40 minutes, residual AHI at goal at 1.4/h, leak on the low side with a 95th percentile at 3 L/min on a pressure of 12 cm with EPR of 3. [name] reports that [name] has been on supplemental oxygen at 1 L/min since [name] started CPAP therapy, prescribed by Dr. Redmond Pulling, ENT.  Of note, [name] was recently hospitalized August with acute diastolic congestive heart failure and acute hypoxic respiratory failure. [name] reports that her daytime sleepiness has been increasing over the past few years.  [name] is fully compliant with her CPAP.  In fact, [name] reports that [name] cannot sleep without it.  [name] has a somewhat variable sleep schedule.  On the weekends [name] tries to catch up and sleep longer, up to 10 or 12 hours even on weekend nights.  [name] has to be at work early.  [name] works Monday through Fridays, generally from 5 AM to around 3 PM.  Bedtime is around 8 and rise time between 230 and 3 AM.  [name] does not have night to night nocturia, has occasional morning headaches.  [name] is trying to quit smoking, smokes about half a pack per day, currently no alcohol and caffeine and limitation, 1 cup in the morning typically.  [name] has a TV in her bedroom but does not watch it at night.  [name] has a small dog which does not sleep on the bed with her.  [name] is widowed for about 6 years.  [name] has a grown daughter who lives about 15 miles away.  [name] has no family history of sleep apnea.  [name] had a tonsillectomy at age 14.  Her Past Medical History Is Significant For: Past Medical History:  Diagnosis Date  . Anxiety   . Aortic stenosis    a. Mild by 2D ECHO 11/17/14  . CAD (coronary artery disease)    a. 10/2014: Ant STEMI s/p DES to dLAD  . Carotid disease, bilateral ([place])    a. Duplex 12/2014: mild-mod atherosclerotic plaque  without hemodynamically significant stenosis.  [name] COPD (chronic obstructive pulmonary disease) ([place])   . Essential hypertension   . HLD (hyperlipidemia)   . Morbid obesity ([place])   . Myocardial infarction ([place]) 11/17/2014  . PONV (postoperative nausea and vomiting)   . Shingles   . Sleep apnea    uses CPAP  . Type 2 diabetes mellitus ([place])     Her Past Surgical History Is Significant For: Past Surgical History:  Procedure Laterality Date  . ABDOMINAL HYSTERECTOMY    . BREAST BIOPSY Left    2016  . CARDIAC CATHETERIZATION N/A 11/17/2014   Procedure: Left Heart Cath and Coronary Angiography;  Surgeon: Wellington Hampshire, MD;  Location: [facility]  INVASIVE CV LAB;  Service: Cardiovascular;  Laterality: N/A;  . CARDIAC CATHETERIZATION N/A 11/17/2014   Procedure: Coronary Stent Intervention;  Surgeon: Wellington Hampshire, MD;  Location: [place] CV LAB;  Service: Cardiovascular;  Laterality: N/A;  distal lad 2.25x20 promus  . CATARACT EXTRACTION W/PHACO Left 09/20/2016   Procedure: CATARACT EXTRACTION PHACO AND INTRAOCULAR LENS PLACEMENT (IOC);  Surgeon: Tonny Branch, MD;  Location: [facility];  Service: Ophthalmology;  Laterality: Left;  CDE: 9.27  . CATARACT EXTRACTION W/PHACO Right 10/18/2016   Procedure: CATARACT EXTRACTION PHACO AND INTRAOCULAR LENS PLACEMENT (IOC);  Surgeon: Tonny Branch, MD;  Location: [facility];  Service: Ophthalmology;  Laterality: Right;  CDE: 6.38  . CHOLECYSTECTOMY    . SPINAL FUSION     cervical; screws and plates.    Her Family History Is Significant For: Family History  Problem Relation Age of Onset  . Heart attack Father   . Heart disease Father   . Cancer Brother     Her Social History Is Significant For: Social History   Socioeconomic History  . Marital status: Widowed    Spouse name: Not on file  . Number of children: Not on file  . Years of education: Not on file  . Highest education level: Not on file  Occupational History  . Occupation: Marketing executive work for  [employer]  Tobacco Use  . Smoking status: Current Some Day Smoker    Packs/day: 0.25    Years: 30.00    Pack years: 7.50    Types: Cigarettes    Start date: 12/26/1972  . Smokeless tobacco: Never Used  . Tobacco comment: 3 cigs per day  Substance and Sexual Activity  . Alcohol use: No    Alcohol/week: 0.0 standard drinks  . Drug use: No  . Sexual activity: Yes    Partners: Male    Birth control/protection: Surgical  Other Topics Concern  . Not on file  Social History Narrative  . Not on file   Social Determinants of Health   Financial Resource Strain:   . Difficulty of Paying Living Expenses: Not on file  Food Insecurity:   . Worried About Charity fundraiser in the Last Year: Not on file  . Ran Out of Food in the Last Year: Not on file  Transportation Needs:   . Lack of Transportation (Medical): Not on file  . Lack of Transportation (Non-Medical): Not on file  Physical Activity:   . Days of Exercise per Week: Not on file  . Minutes of Exercise per Session: Not on file  Stress:   . Feeling of Stress : Not on file  Social Connections:   . Frequency of Communication with Friends and Family: Not on file  . Frequency of Social Gatherings with Friends and Family: Not on file  . Attends Religious Services: Not on file  . Active Member of Clubs or Organizations: Not on file  . Attends Archivist Meetings: Not on file  . Marital Status: Not on file    Her Allergies Are:  Allergies  Allergen Reactions  . Codeine Nausea And Vomiting  . Sulfa Antibiotics Rash  :   Her Current Medications Are:  Outpatient Encounter Medications as of 03/29/2019  Medication Sig  . acetaminophen (TYLENOL) 500 MG tablet Take 500 mg by mouth daily as needed for moderate pain or headache.  . albuterol (PROVENTIL HFA;VENTOLIN HFA) 108 (90 Base) MCG/ACT inhaler Inhale 2 puffs into the lungs every 4 (four) hours as needed for  wheezing or shortness of breath.  [name] albuterol  (PROVENTIL) (2.5 MG/3ML) 0.083% nebulizer solution Take 3 mLs (2.5 mg total) by nebulization every 4 (four) hours as needed for wheezing or shortness of breath.  [name] aspirin EC 81 MG tablet Take 81 mg by mouth daily.  . dapagliflozin propanediol (FARXIGA) 10 MG TABS tablet Take 10 mg by mouth daily.  [name] escitalopram (LEXAPRO) 20 MG tablet Take 20 mg by mouth 2 (two) times a day.  . insulin aspart (NOVOLOG FLEXPEN) 100 UNIT/ML FlexPen Inject 2-8 Units into the skin See admin instructions. Inject 2-8 units subcutaneously three times daily before meals if CBG is over 150 per sliding meals - 150-250 2 units, >250 8 units  . Insulin Detemir (LEVEMIR FLEXTOUCH) 100 UNIT/ML Pen Inject 80 Units into the skin 2 (two) times daily.  [name] lisinopril (ZESTRIL) 10 MG tablet TAKE 1 TABLET BY MOUTH EVERY DAY (Patient taking differently: Take 10 mg by mouth daily. )  . metFORMIN (GLUCOPHAGE) 1000 MG tablet Take 1,000 mg by mouth 2 (two) times daily.   . metoprolol succinate (TOPROL XL) 25 MG 24 hr tablet Take 1 tablet (25 mg total) by mouth daily.  . Vitamin D, Ergocalciferol, (DRISDOL) 50000 units CAPS capsule Take 50,000 Units by mouth every Saturday.  [name] atorvastatin (LIPITOR) 10 MG tablet TAKE 1 TABLET (10 MG TOTAL) BY MOUTH EVERY OTHER DAY. TAKE AT DINNER  . furosemide (LASIX) 20 MG tablet Take 2 tablets (40 mg total) by mouth daily.  . potassium chloride SA (K-DUR) 20 MEQ tablet Take 1 tablet (20 mEq total) by mouth daily.   No facility-administered encounter medications on file as of 03/29/2019.  :  Review of Systems:  Out of a complete 14 point review of systems, all are reviewed and negative with the exception of these symptoms as listed below: Review of Systems  Neurological:       Here for f/u on cipap. Pt reports machine is working well.     Objective:  Neurological Exam  Physical Exam Physical Examination:   Vitals:   03/29/19 1256  BP: (!) 172/70  Pulse: 77  Temp: (!) 95.8 F (35.4 C)     General Examination: The patient is a very pleasant 64 y.o. female in no acute distress. [name] appears well-developed and well-nourished and well groomed.   HEENT: Normocephalic, atraumatic, pupils are equal, round and reactive to light, extraocular tracking is good without limitation to gaze excursion or nystagmus noted. Normal smooth pursuit is noted. Hearing is grossly intact. Face is symmetric with normal facial animation and normal facial sensation. Speech is clear with no dysarthria noted. There is no hypophonia. There is no lip, neck/head, jaw or voice tremor. Neck is supple with full range of passive and active motion. Oropharynx exam reveals: moderate mouth dryness, adequate dental hygiene With dentures on top and some missing teeth on the bottom.  [name] has a moderately crowded airway. Changes in keeping with rosacea affecting her nose and lateral face b/l. Unremarkable anterior neck scar from prior neck surgery.  Chest: Clear to auscultation without wheezing, rhonchi or crackles noted.  Heart: S1+S2+0, regular and normal without murmurs, rubs or gallops noted.   Abdomen: Soft, non-tender and non-distended with normal bowel sounds appreciated on auscultation.  Extremities: There is no pitting edema in the distal lower extremities bilaterally. Knee-high compression socks in place bilaterally.  Skin: Warm and dry without trophic changes noted. Rosacea.  Musculoskeletal: exam reveals no obvious joint deformities, tenderness or joint swelling or  erythema.   Neurologically:  Mental status: The patient is awake, alert and oriented in all 4 spheres. Her immediate and remote memory, attention, language skills and fund of knowledge are appropriate. There is no evidence of aphasia, agnosia, apraxia or anomia. Speech is clear with normal prosody and enunciation. Thought process is linear. Mood is normal and affect is normal.  Cranial nerves II - XII are as described above under HEENT exam.  Motor exam: Normal bulk, strength and tone is noted. There is no tremor. Fine motor skills and coordination: grossly intact.  Cerebellar testing: No dysmetria or intention tremor on finger to nose testing. Heel to shin is unremarkable bilaterally. There is no truncal or gait ataxia.  Sensory exam: intact to light touch in the upper and lower extremities.  Gait, station and balance: [name] stands easily. No veering to one side is noted. No leaning to one side is noted. Posture is age-appropriate and stance is narrow based. Gait shows normal stride length and normal pace. No problems turning are noted.  Assessment and Plan:   In summary, LARIA GRIMMETT is a very pleasant 64 year old female  with an underlying complex medical history of coronary artery disease with history of STEMI, status post coronary stent placement, carotid artery disease, COPD, hypertension, hyperlipidemia, diabetes, aortic stenosis, anxiety and morbid obesity with a BMI of over 54, who Presents for Follow-up consultation of her severe obstructive sleep apnea.  [name] previously had a CPAP machine along with supplemental oxygen at 1 L/min.  [name] had interim sleep testing, [name] had a baseline sleep study in October 2020 and a CPAP titration study in November 2020.  [name] required quite a bit of pressure and also additional supplemental oxygen to be optimized in her treatment.  [name] is currently on BiPAP of 19/14.  [name] is fully compliant with treatment and highly commended.[name] had recently a couple of days without treatment, secondary to power outages.  [name] is highly commended for her treatment adherence and also encouraged to continue to work on weight loss.  [name] is advised to continue with the current settings of her BiPAP machine with a pressure of 19/14 centimeters, supplemental oxygen at night at 3 L/min, [name] is using a nasal mask successfully with slightly higher leaks but [name] can tolerated and numbers otherwise look great.  [name] feels much improved,  [name] has greatly benefited from her BiPAP and is at this point encouraged to follow-up routinely to see one of our nurse practitioners in 6 months and then hopefully yearly thereafter.  I answered all her questions today and [name] was in agreement.  I spent 30 minutes in total face-to-face time and in reviewing records during pre-charting, more than 50% of which was spent in counseling and coordination of care, reviewing test results, reviewing medications and treatment regimen and/or in discussing or reviewing the diagnosis of OSA, the prognosis and treatment options. Pertinent laboratory and imaging test results that were available during this visit with the patient were reviewed by me and considered in my medical decision making (see chart for details).

## 2019-04-17 ENCOUNTER — Ambulatory Visit: Payer: BC Managed Care – PPO | Admitting: Cardiology

## 2019-04-17 ENCOUNTER — Encounter: Payer: Self-pay | Admitting: Cardiology

## 2019-04-17 VITALS — BP 140/74 | HR 60 | Temp 98.2°F | Ht 64.0 in | Wt 219.0 lb

## 2019-04-17 DIAGNOSIS — I25119 Atherosclerotic heart disease of native coronary artery with unspecified angina pectoris: Secondary | ICD-10-CM

## 2019-04-17 DIAGNOSIS — I5032 Chronic diastolic (congestive) heart failure: Secondary | ICD-10-CM

## 2019-04-17 DIAGNOSIS — I350 Nonrheumatic aortic (valve) stenosis: Secondary | ICD-10-CM

## 2019-04-17 NOTE — Progress Notes (Signed)
Cardiology Office Note  Date: 04/17/2019   ID: Morgan, Aguilar [birth year], MRN [id number]  PCP:  Morgan Blitz, MD  Cardiologist:  Rozann Lesches, MD Electrophysiologist:  None   Chief Complaint  Patient presents with  . Cardiac follow-up    History of Present Illness: [name] is a 64 y.o. female last seen in September 2020 by Ms. Strader PA-C.  She presents for a routine visit.  Still working full-time, states that she has been very busy.  Tries to find time to get outdoors which she enjoys at the end of the day.  She does not report any angina at this time.  At last visit she was switched from Lopressor to Toprol-XL.  She otherwise remains on aspirin, Lipitor, Lasix, and lisinopril.  We are requesting her last lab work from Dr. Manuella Ghazi.  She is tolerating Lipitor at low-dose, 10 mg every other day.  Past Medical History:  Diagnosis Date  . Anxiety   . Aortic stenosis    a. Mild by 2D ECHO 11/17/14  . CAD (coronary artery disease)    a. 10/2014: Ant STEMI s/p DES to dLAD  . Carotid disease, bilateral ([place])    a. Duplex 12/2014: mild-mod atherosclerotic plaque without hemodynamically significant stenosis.  [name] COPD (chronic obstructive pulmonary disease) ([place])   . Essential hypertension   . HLD (hyperlipidemia)   . Morbid obesity ([place])   . Myocardial infarction ([place]) 11/17/2014  . Shingles   . Sleep apnea    Uses CPAP  . Type 2 diabetes mellitus ([place])     Past Surgical History:  Procedure Laterality Date  . ABDOMINAL HYSTERECTOMY    . BREAST BIOPSY Left    2016  . CARDIAC CATHETERIZATION N/A 11/17/2014   Procedure: Left Heart Cath and Coronary Angiography;  Surgeon: Wellington Hampshire, MD;  Location: [facility];  Service: Cardiovascular;  Laterality: N/A;  . CARDIAC CATHETERIZATION N/A 11/17/2014   Procedure: Coronary Stent Intervention;  Surgeon: Wellington Hampshire, MD;  Location: [facility];  Service: Cardiovascular;  Laterality: N/A;  distal  lad 2.25x20 promus  . CATARACT EXTRACTION W/PHACO Left 09/20/2016   Procedure: CATARACT EXTRACTION PHACO AND INTRAOCULAR LENS PLACEMENT (IOC);  Surgeon: Tonny Branch, MD;  Location: [facility];  Service: Ophthalmology;  Laterality: Left;  CDE: 9.27  . CATARACT EXTRACTION W/PHACO Right 10/18/2016   Procedure: CATARACT EXTRACTION PHACO AND INTRAOCULAR LENS PLACEMENT (IOC);  Surgeon: Tonny Branch, MD;  Location: [facility];  Service: Ophthalmology;  Laterality: Right;  CDE: 6.38  . CHOLECYSTECTOMY    . SPINAL FUSION     cervical; screws and plates.    Current Outpatient Medications  Medication Sig Dispense Refill  . acetaminophen (TYLENOL) 500 MG tablet Take 500 mg by mouth daily as needed for moderate pain or headache.    . albuterol (PROVENTIL HFA;VENTOLIN HFA) 108 (90 Base) MCG/ACT inhaler Inhale 2 puffs into the lungs every 4 (four) hours as needed for wheezing or shortness of breath. 1 Inhaler 0  . albuterol (PROVENTIL) (2.5 MG/3ML) 0.083% nebulizer solution Take 3 mLs (2.5 mg total) by nebulization every 4 (four) hours as needed for wheezing or shortness of breath. 75 mL 0  . aspirin EC 81 MG tablet Take 81 mg by mouth daily.    [name] atorvastatin (LIPITOR) 10 MG tablet TAKE 1 TABLET (10 MG TOTAL) BY MOUTH EVERY OTHER DAY. TAKE AT DINNER 45 tablet 3  . dapagliflozin propanediol (FARXIGA) 10 MG TABS tablet Take 10  mg by mouth daily.    [name] escitalopram (LEXAPRO) 20 MG tablet Take 20 mg by mouth 2 (two) times a day.    . furosemide (LASIX) 20 MG tablet Take 2 tablets (40 mg total) by mouth daily. 60 tablet 0  . insulin aspart (NOVOLOG FLEXPEN) 100 UNIT/ML FlexPen Inject 2-8 Units into the skin See admin instructions. Inject 2-8 units subcutaneously three times daily before meals if CBG is over 150 per sliding meals - 150-250 2 units, >250 8 units    . lisinopril (ZESTRIL) 10 MG tablet TAKE 1 TABLET BY MOUTH EVERY DAY (Patient taking differently: Take 10 mg by mouth daily. ) 90 tablet 3  . metFORMIN (GLUCOPHAGE)  1000 MG tablet Take 1,000 mg by mouth 2 (two) times daily.   6  . metoprolol succinate (TOPROL XL) 25 MG 24 hr tablet Take 1 tablet (25 mg total) by mouth daily. 90 tablet 3  . Vitamin D, Ergocalciferol, (DRISDOL) 50000 units CAPS capsule Take 50,000 Units by mouth every Saturday.  12   No current facility-administered medications for this visit.   Allergies:  Codeine and Sulfa antibiotics   ROS:  No palpitations or syncope.  Physical Exam: VS:  BP 140/74   Pulse 60   Temp 98.2 F (36.8 C)   Ht 5\' 4"  (1.626 m)   Wt 219 lb (99.3 kg)   SpO2 93%   BMI 37.59 kg/m , BMI Body mass index is 37.59 kg/m.  Wt Readings from Last 3 Encounters:  04/17/19 219 lb (99.3 kg)  03/29/19 223 lb (101.2 kg)  10/17/18 235 lb (106.6 kg)    General: Patient appears comfortable at rest. HEENT: Conjunctiva and lids normal, wearing a mask. Neck: Supple, no elevated JVP or carotid bruits, no thyromegaly. Lungs: Clear to auscultation, nonlabored breathing at rest. Cardiac: Regular rate and rhythm, no S3, soft significant systolic murmur, no pericardial rub. Abdomen: Soft, nontender, bowel sounds present. Extremities: No pitting edema, distal pulses 2+.  ECG:  An ECG dated 08/27/2018 was personally reviewed today and demonstrated:  Normal sinus rhythm with rightward axis and low voltage, nonspecific T wave changes.  Recent Labwork: 08/25/2018: ALT 32; AST 34 08/27/2018: B Natriuretic Peptide 188.3; Hemoglobin 13.1; Platelets 227 08/28/2018: Magnesium 1.7 09/20/2018: BUN 12; Creatinine, Ser 0.85; Potassium 4.1; Sodium 135   Other Studies Reviewed Today:  Echocardiogram 08/28/2018: 1. The left ventricle has normal systolic function with an ejection  fraction of 60-65%. The cavity size was normal. There is mildly increased  left ventricular wall thickness. Left ventricular diastolic Doppler  parameters are consistent with  pseudonormalization. No evidence of left ventricular regional wall motion    abnormalities.  2. The right ventricle has mildly reduced systolic function. The cavity  was mildly enlarged. There is no increase in right ventricular wall  thickness. D-shaped interventricular septum suggests RV pressure/volume  overload.  3. No evidence of mitral valve stenosis. Trivial mitral regurgitation.  4. The aortic valve is tricuspid. Moderate calcification of the aortic  valve. Mild stenosis of the aortic valve.  5. The aorta is normal in size and structure.  6. The aortic root is normal in size and structure.  7. The inferior vena cava was dilated in size with <50% respiratory  variability. PA systolic pressure 57 mmHg.   Assessment and Plan:  1.  CAD status post DES to the LAD in October 2016.  She is doing well without active angina at this time.  Continue aspirin, lisinopril, Toprol-XL, and Lipitor.  2.  Mixed hyperlipidemia, remains on low-dose Lipitor without side effects.  Requesting interval lab work from Dr. Manuella Ghazi.  3.  Mild aortic stenosis, asymptomatic.  No significant change in murmur.  Medication Adjustments/Labs and Tests Ordered: Current medicines are reviewed at length with the patient today.  Concerns regarding medicines are outlined above.   Tests Ordered: No orders of the defined types were placed in this encounter.   Medication Changes: No orders of the defined types were placed in this encounter.   Disposition:  Follow up 6 months in the [place] office.  Signed, Satira Sark, MD, [facility] 04/17/2019 11:36 AM    [facility]. [address], [place], [place] [address] Phone: [phone]; Fax: [phone]

## 2019-04-17 NOTE — Patient Instructions (Signed)
Medication Instructions:  Your physician recommends that you continue on your current medications as directed. Please refer to the Current Medication list given to you today.  *If you need a refill on your cardiac medications before your next appointment, please call your pharmacy*   Lab Work: None today If you have labs (blood work) drawn today and your tests are completely normal, you will receive your results only by: [name] MyChart Message (if you have MyChart) OR . A paper copy in the mail If you have any lab test that is abnormal or we need to change your treatment, we will call you to review the results.   Testing/Procedures: None today   Follow-Up: At [facility], you and your health needs are our priority.  As part of our continuing mission to provide you with exceptional heart care, we have created designated Provider Care Teams.  These Care Teams include your primary Cardiologist (physician) and Advanced Practice Providers (APPs -  Physician Assistants and Nurse Practitioners) who all work together to provide you with the care you need, when you need it.  We recommend signing up for the patient portal called "MyChart".  Sign up information is provided on this After Visit Summary.  MyChart is used to connect with patients for Virtual Visits (Telemedicine).  Patients are able to view lab/test results, encounter notes, upcoming appointments, etc.  Non-urgent messages can be sent to your provider as well.   To learn more about what you can do with MyChart, go to [email or web address].    Your next appointment:   6 month(s)  The format for your next appointment:   In Person  Provider:   Dr.Samuel  Domenic Polite   Other Instructions None       Thank you for choosing [facility] !

## 2019-06-24 ENCOUNTER — Other Ambulatory Visit: Payer: Self-pay | Admitting: Cardiology

## 2019-09-21 ENCOUNTER — Other Ambulatory Visit: Payer: Self-pay | Admitting: Student

## 2019-09-24 NOTE — Telephone Encounter (Signed)
This is a  pt.  °

## 2019-10-15 ENCOUNTER — Encounter: Payer: Self-pay | Admitting: Adult Health

## 2019-10-15 ENCOUNTER — Other Ambulatory Visit: Payer: Self-pay

## 2019-10-15 ENCOUNTER — Ambulatory Visit (INDEPENDENT_AMBULATORY_CARE_PROVIDER_SITE_OTHER): Payer: BC Managed Care – PPO | Admitting: Adult Health

## 2019-10-15 VITALS — BP 118/68 | HR 74 | Ht 64.0 in | Wt 210.4 lb

## 2019-10-15 DIAGNOSIS — G4733 Obstructive sleep apnea (adult) (pediatric): Secondary | ICD-10-CM

## 2019-10-15 NOTE — Patient Instructions (Signed)
Continue using BiPAP nightly and greater than 4 hours each night If your symptoms worsen or you develop new symptoms please let us know.    

## 2019-10-15 NOTE — Progress Notes (Signed)
PATIENT: [name] DOB: 29-Nov-1955  REASON FOR VISIT: follow up HISTORY FROM: patient  HISTORY OF PRESENT ILLNESS: Today 10/15/19:  Ms. Lesesne is a 64 year old female with a history of obstructive sleep apnea on BiPAP.  Her download indicates that she use her machine nightly for compliance of 100%.  She use her machine greater than 4 hours each night.  On average she uses her machine 7 hours and 53 minutes.  Her residual AHI is 6.3 on 19/14 centimeters of water.  Her leak in the 95th percentile is 41 L/min.  She reports that she does feel the mask leaking at night.  She reports that she has lost some weight as well and this may have affected the way her mask fits.  She returns today for an evaluation.  HISTORY 03/29/2019: I reviewed her BiPAP compliance data from 02/26/2019 through 03/27/2019, which is a total of 30 days, during which time she used her machine 28 days with percent use days greater than 4 hours at 93%, indicating excellent compliance with an average usage of 8 hours and 17 minutes for days on treatment, residual AHI at goal at 2.9/h, leak on the higher side with a 95th percentile at 26.2 L/min on a pressure of 19/14 cm.  She reports doing rather well with her new BiPAP machine using supplemental oxygen at night at 3 L/min without additional problems.  She feels improved, she feels that changing to the BiPAP and increasing the oxygen has really helped her, she feels much better rested in the mornings, her energy level is improved.  She is very motivated to continue with treatment.  When she had cellulitis last year in August, she lost quite a bit of weight.  She is motivated to continue to work on weight loss.  She is still debating about the Covid vaccine, has not decided yet, she does have an appointment with her primary care physician this month and will bring it up with him as well.   The patient's allergies, current medications, family history, past medical history, past social  history, past surgical history and problem list were reviewed and updated as appropriate.   REVIEW OF SYSTEMS: Out of a complete 14 system review of symptoms, the patient complains only of the following symptoms, and all other reviewed systems are negative.   FSS 23 ESS 0 ALLERGIES: Allergies  Allergen Reactions  . Codeine Nausea And Vomiting  . Sulfa Antibiotics Rash    HOME MEDICATIONS: Outpatient Medications Prior to Visit  Medication Sig Dispense Refill  . acetaminophen (TYLENOL) 500 MG tablet Take 500 mg by mouth daily as needed for moderate pain or headache.    . albuterol (PROVENTIL HFA;VENTOLIN HFA) 108 (90 Base) MCG/ACT inhaler Inhale 2 puffs into the lungs every 4 (four) hours as needed for wheezing or shortness of breath. 1 Inhaler 0  . albuterol (PROVENTIL) (2.5 MG/3ML) 0.083% nebulizer solution Take 3 mLs (2.5 mg total) by nebulization every 4 (four) hours as needed for wheezing or shortness of breath. 75 mL 0  . aspirin EC 81 MG tablet Take 81 mg by mouth daily.    . dapagliflozin propanediol (FARXIGA) 10 MG TABS tablet Take 10 mg by mouth daily.    [name] escitalopram (LEXAPRO) 20 MG tablet Take 20 mg by mouth 2 (two) times a day.    . furosemide (LASIX) 20 MG tablet TAKE 1 TABLET BY MOUTH EVERY DAY 90 tablet 3  . lisinopril (ZESTRIL) 10 MG tablet Take 1 tablet (  10 mg total) by mouth daily. 90 tablet 1  . metFORMIN (GLUCOPHAGE) 1000 MG tablet Take 1,000 mg by mouth 2 (two) times daily.   6  . metoprolol succinate (TOPROL-XL) 25 MG 24 hr tablet TAKE 1 TABLET BY MOUTH EVERY DAY 90 tablet 1  . TRESIBA FLEXTOUCH 200 UNIT/ML FlexTouch Pen 40 Units 2 (two) times daily after a meal.    . Vitamin D, Ergocalciferol, (DRISDOL) 50000 units CAPS capsule Take 50,000 Units by mouth every Saturday.  12  . atorvastatin (LIPITOR) 10 MG tablet TAKE 1 TABLET (10 MG TOTAL) BY MOUTH EVERY OTHER DAY. TAKE AT DINNER 45 tablet 3  . insulin aspart (NOVOLOG FLEXPEN) 100 UNIT/ML FlexPen Inject 2-8 Units  into the skin See admin instructions. Inject 2-8 units subcutaneously three times daily before meals if CBG is over 150 per sliding meals - 150-250 2 units, >250 8 units     No facility-administered medications prior to visit.    PAST MEDICAL HISTORY: Past Medical History:  Diagnosis Date  . Anxiety   . Aortic stenosis    a. Mild by 2D ECHO 11/17/14  . CAD (coronary artery disease)    a. 10/2014: Ant STEMI s/p DES to dLAD  . Carotid disease, bilateral ([place])    a. Duplex 12/2014: mild-mod atherosclerotic plaque without hemodynamically significant stenosis.  [name] COPD (chronic obstructive pulmonary disease) ([place])   . Essential hypertension   . HLD (hyperlipidemia)   . Morbid obesity ([place])   . Myocardial infarction ([place]) 11/17/2014  . Shingles   . Sleep apnea    Uses CPAP  . Type 2 diabetes mellitus ([place])     PAST SURGICAL HISTORY: Past Surgical History:  Procedure Laterality Date  . ABDOMINAL HYSTERECTOMY    . BREAST BIOPSY Left    2016  . CARDIAC CATHETERIZATION N/A 11/17/2014   Procedure: Left Heart Cath and Coronary Angiography;  Surgeon: [place] Hampshire, MD;  Location: [facility];  Service: Cardiovascular;  Laterality: N/A;  . CARDIAC CATHETERIZATION N/A 11/17/2014   Procedure: Coronary Stent Intervention;  Surgeon: Wellington Hampshire, MD;  Location: [facility];  Service: Cardiovascular;  Laterality: N/A;  distal lad 2.25x20 promus  . CATARACT EXTRACTION W/PHACO Left 09/20/2016   Procedure: CATARACT EXTRACTION PHACO AND INTRAOCULAR LENS PLACEMENT (IOC);  Surgeon: Tonny Branch, MD;  Location: [facility];  Service: Ophthalmology;  Laterality: Left;  CDE: 9.27  . CATARACT EXTRACTION W/PHACO Right 10/18/2016   Procedure: CATARACT EXTRACTION PHACO AND INTRAOCULAR LENS PLACEMENT (IOC);  Surgeon: Tonny Branch, MD;  Location: [facility];  Service: Ophthalmology;  Laterality: Right;  CDE: 6.38  . CHOLECYSTECTOMY    . SPINAL FUSION     cervical; screws and plates.    FAMILY  HISTORY: Family History  Problem Relation Age of Onset  . Heart attack Father   . Heart disease Father   . Cancer Brother     SOCIAL HISTORY: Social History   Socioeconomic History  . Marital status: Widowed    Spouse name: Not on file  . Number of children: Not on file  . Years of education: Not on file  . Highest education level: Not on file  Occupational History  . Occupation: Marketing executive work for [employer]  Tobacco Use  . Smoking status: Current Some Day Smoker    Packs/day: 0.50    Years: 30.00    Pack years: 15.00    Types: Cigarettes    Start date: 12/26/1972  . Smokeless tobacco: Never Used  .  Tobacco comment: 3 cigs per day  Vaping Use  . Vaping Use: Never used  Substance and Sexual Activity  . Alcohol use: No    Alcohol/week: 0.0 standard drinks  . Drug use: No  . Sexual activity: Yes    Partners: Male    Birth control/protection: Surgical  Other Topics Concern  . Not on file  Social History Narrative  . Not on file   Social Determinants of Health   Financial Resource Strain:   . Difficulty of Paying Living Expenses: Not on file  Food Insecurity:   . Worried About Charity fundraiser in the Last Year: Not on file  . Ran Out of Food in the Last Year: Not on file  Transportation Needs:   . Lack of Transportation (Medical): Not on file  . Lack of Transportation (Non-Medical): Not on file  Physical Activity:   . Days of Exercise per Week: Not on file  . Minutes of Exercise per Session: Not on file  Stress:   . Feeling of Stress : Not on file  Social Connections:   . Frequency of Communication with Friends and Family: Not on file  . Frequency of Social Gatherings with Friends and Family: Not on file  . Attends Religious Services: Not on file  . Active Member of Clubs or Organizations: Not on file  . Attends Archivist Meetings: Not on file  . Marital Status: Not on file  Intimate Partner Violence:   . Fear of Current or Ex-Partner:  Not on file  . Emotionally Abused: Not on file  . Physically Abused: Not on file  . Sexually Abused: Not on file      PHYSICAL EXAM  Vitals:   10/15/19 1309  BP: 118/68  Pulse: 74  Weight: 210 lb 6.4 oz (95.4 kg)  Height: 5\' 4"  (1.626 m)   Body mass index is 36.12 kg/m.  Generalized: Well developed, in no acute distress  Chest: Lungs clear to auscultation bilaterally  Neurological examination  Mentation: Alert oriented to time, place, history taking. Follows all commands speech and language fluent Cranial nerve II-XII: Extraocular movements were full, visual field were full on confrontational test Head turning and shoulder shrug  were normal and symmetric. Motor: The motor testing reveals 5 over 5 strength of all 4 extremities. Good symmetric motor tone is noted throughout.  Sensory: Sensory testing is intact to soft touch on all 4 extremities. No evidence of extinction is noted.  Gait and station: Gait is normal.    DIAGNOSTIC DATA (LABS, IMAGING, TESTING) - I reviewed patient records, labs, notes, testing and imaging myself where available.  Lab Results  Component Value Date   WBC 5.4 08/27/2018   HGB 13.1 08/27/2018   HCT 41.8 08/27/2018   MCV 94.8 08/27/2018   PLT 227 08/27/2018      Component Value Date/Time   NA 135 09/20/2018 1146   K 4.1 09/20/2018 1146   CL 100 09/20/2018 1146   CO2 26 09/20/2018 1146   GLUCOSE 243 (H) 09/20/2018 1146   BUN 12 09/20/2018 1146   CREATININE 0.85 09/20/2018 1146   CREATININE 0.81 01/14/2015 1557   CALCIUM 8.4 (L) 09/20/2018 1146   PROT 7.9 08/25/2018 1135   ALBUMIN 3.2 (L) 08/25/2018 1135   AST 34 08/25/2018 1135   ALT 32 08/25/2018 1135   ALKPHOS 77 08/25/2018 1135   BILITOT 1.0 08/25/2018 1135   GFRNONAA >60 09/20/2018 1146   GFRAA >60 09/20/2018 1146   Lab Results  Component Value Date   CHOL 107 (L) 11/07/2015   HDL 38 (L) 11/07/2015   LDLCALC 38 11/07/2015   TRIG 154 (H) 11/07/2015   CHOLHDL 2.8  11/07/2015   Lab Results  Component Value Date   HGBA1C 8.4 (H) 08/25/2018   No results found for: [id number] Lab Results  Component Value Date   TSH 2.063 01/14/2015      ASSESSMENT AND PLAN 64 y.o. year old female  has a past medical history of Anxiety, Aortic stenosis, CAD (coronary artery disease), Carotid disease, bilateral ([place]), COPD (chronic obstructive pulmonary disease) ([place]), Essential hypertension, HLD (hyperlipidemia), Morbid obesity ([place]), Myocardial infarction ([place]) (11/17/2014), Shingles, Sleep apnea, and Type 2 diabetes mellitus ([place]). here with:  1. OSA on CPAP  - CPAP compliance excellent - Good treatment of AHI  -Mask refitting ordered - Encourage patient to use CPAP nightly and > 4 hours each night - F/U in 1 year or sooner if needed   I spent 20 minutes of face-to-face and non-face-to-face time with patient.  This included previsit chart review, lab review, study review, order entry, electronic health record documentation, patient education.  Ward Givens, MSN, NP-C 10/15/2019, 1:31 PM [facility] [address] [place], [place] [address] [phone]

## 2019-10-22 ENCOUNTER — Ambulatory Visit
Admission: EM | Admit: 2019-10-22 | Discharge: 2019-10-22 | Disposition: A | Discharge status: Home / Self Care | Payer: BC Managed Care – PPO | Attending: Emergency Medicine | Admitting: Emergency Medicine

## 2019-10-22 ENCOUNTER — Other Ambulatory Visit: Payer: Self-pay

## 2019-10-22 ENCOUNTER — Encounter: Payer: Self-pay | Admitting: Emergency Medicine

## 2019-10-22 DIAGNOSIS — M549 Dorsalgia, unspecified: Secondary | ICD-10-CM | POA: Insufficient documentation

## 2019-10-22 LAB — POCT URINALYSIS DIP (MANUAL ENTRY)
Bilirubin, UA: NEGATIVE
Glucose, UA: 500 mg/dL — AB
Ketones, POC UA: NEGATIVE mg/dL
Leukocytes, UA: NEGATIVE
Nitrite, UA: NEGATIVE
Protein Ur, POC: NEGATIVE mg/dL
Spec Grav, UA: 1.02 (ref 1.010–1.025)
Urobilinogen, UA: 0.2 E.U./dL
pH, UA: 5 (ref 5.0–8.0)

## 2019-10-22 MED ORDER — TIZANIDINE HCL 2 MG PO CAPS: 2.0000 mg | ORAL_CAPSULE | Freq: Three times a day (TID) | ORAL | 0 refills | Status: DC

## 2019-10-22 MED ORDER — DEXAMETHASONE SODIUM PHOSPHATE 10 MG/ML IJ SOLN: 10.0000 mg | Freq: Once | INTRAMUSCULAR | Status: DC

## 2019-10-22 MED ORDER — PREDNISONE 20 MG PO TABS: 20.0000 mg | ORAL_TABLET | Freq: Two times a day (BID) | ORAL | 0 refills | Status: AC

## 2019-10-22 NOTE — ED Provider Notes (Signed)
[facility]   [id number] 10/22/19 Arrival Time: 9935  CC: back PAIN  SUBJECTIVE: History from: patient. [name] is a 64 y.o. female complains of right mid back/ flank pain x 1 day.  Denies a precipitating event or specific injury.  Localizes the pain to the RT mid back/ flank.  Describes the pain as intermittent and spasm in character.  Has tried OTC medications without relief.  Symptoms are made worse with movement.  Denies similar symptoms in the past.  Denies fever, chills, erythema, ecchymosis, effusion, weakness, numbness and tingling, saddle paresthesias, loss of bowel or bladder function, urinary frequency, urinary urgency, blood in urine.      ROS: As per HPI.  All other pertinent ROS negative.     Past Medical History:  Diagnosis Date   Anxiety    Aortic stenosis    a. Mild by 2D ECHO 11/17/14   CAD (coronary artery disease)    a. 10/2014: Ant STEMI s/p DES to dLAD   Carotid disease, bilateral ([place])    a. Duplex 12/2014: mild-mod atherosclerotic plaque without hemodynamically significant stenosis.   COPD (chronic obstructive pulmonary disease) (HCC)    Essential hypertension    HLD (hyperlipidemia)    Morbid obesity ([place])    Myocardial infarction ([place]) 11/17/2014   Shingles    Sleep apnea    Uses CPAP   Type 2 diabetes mellitus [facility])    Past Surgical History:  Procedure Laterality Date   ABDOMINAL HYSTERECTOMY     BREAST BIOPSY Left    2016   CARDIAC CATHETERIZATION N/A 11/17/2014   Procedure: Left Heart Cath and Coronary Angiography;  Surgeon: Wellington Hampshire, MD;  Location: [facility];  Service: Cardiovascular;  Laterality: N/A;   CARDIAC CATHETERIZATION N/A 11/17/2014   Procedure: Coronary Stent Intervention;  Surgeon: Wellington Hampshire, MD;  Location: [facility];  Service: Cardiovascular;  Laterality: N/A;  distal lad 2.25x20 promus   CATARACT EXTRACTION W/PHACO Left 09/20/2016   Procedure: CATARACT EXTRACTION  PHACO AND INTRAOCULAR LENS PLACEMENT (IOC);  Surgeon: Tonny Branch, MD;  Location: [facility];  Service: Ophthalmology;  Laterality: Left;  CDE: 9.27   CATARACT EXTRACTION W/PHACO Right 10/18/2016   Procedure: CATARACT EXTRACTION PHACO AND INTRAOCULAR LENS PLACEMENT (IOC);  Surgeon: Tonny Branch, MD;  Location: [facility];  Service: Ophthalmology;  Laterality: Right;  CDE: 6.38   CHOLECYSTECTOMY     SPINAL FUSION     cervical; screws and plates.   Allergies  Allergen Reactions   Codeine Nausea And Vomiting   Sulfa Antibiotics Rash   No current facility-administered medications on file prior to encounter.   Current Outpatient Medications on File Prior to Encounter  Medication Sig Dispense Refill   acetaminophen (TYLENOL) 500 MG tablet Take 500 mg by mouth daily as needed for moderate pain or headache.     albuterol (PROVENTIL HFA;VENTOLIN HFA) 108 (90 Base) MCG/ACT inhaler Inhale 2 puffs into the lungs every 4 (four) hours as needed for wheezing or shortness of breath. 1 Inhaler 0   albuterol (PROVENTIL) (2.5 MG/3ML) 0.083% nebulizer solution Take 3 mLs (2.5 mg total) by nebulization every 4 (four) hours as needed for wheezing or shortness of breath. 75 mL 0   aspirin EC 81 MG tablet Take 81 mg by mouth daily.     atorvastatin (LIPITOR) 10 MG tablet TAKE 1 TABLET (10 MG TOTAL) BY MOUTH EVERY OTHER DAY. TAKE AT DINNER 45 tablet 3   dapagliflozin propanediol (FARXIGA) 10 MG TABS  tablet Take 10 mg by mouth daily.     escitalopram (LEXAPRO) 20 MG tablet Take 20 mg by mouth 2 (two) times a day.     furosemide (LASIX) 20 MG tablet TAKE 1 TABLET BY MOUTH EVERY DAY 90 tablet 3   lisinopril (ZESTRIL) 10 MG tablet Take 1 tablet (10 mg total) by mouth daily. 90 tablet 1   metFORMIN (GLUCOPHAGE) 1000 MG tablet Take 1,000 mg by mouth 2 (two) times daily.   6   metoprolol succinate (TOPROL-XL) 25 MG 24 hr tablet TAKE 1 TABLET BY MOUTH EVERY DAY 90 tablet 1   TRESIBA FLEXTOUCH 200 UNIT/ML FlexTouch  Pen 40 Units 2 (two) times daily after a meal.     Vitamin D, Ergocalciferol, (DRISDOL) 50000 units CAPS capsule Take 50,000 Units by mouth every Saturday.  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  Tobacco Use   Smoking status: Current Some Day Smoker    Packs/day: 0.50    Years: 30.00    Pack years: 15.00    Types: Cigarettes    Start date: 12/26/1972   Smokeless tobacco: Never Used   Tobacco comment: 3 cigs per day  Vaping Use   Vaping Use: Never used  Substance and Sexual Activity   Alcohol use: No    Alcohol/week: 0.0 standard drinks   Drug use: No   Sexual activity: Yes    Partners: Male    Birth control/protection: Surgical  Other Topics Concern   Not on file  Social History Narrative   Not on file   Social Determinants of Health   Financial Resource Strain:    Difficulty of Paying Living Expenses: Not on file  Food Insecurity:    Worried About Charity fundraiser in the Last Year: Not on file   [employer] of Food in the Last Year: Not on file  Transportation Needs:    Lack of Transportation (Medical): Not on file   Lack of Transportation (Non-Medical): Not on file  Physical Activity:    Days of Exercise per Week: Not on file   Minutes of Exercise per Session: Not on file  Stress:    Feeling of Stress : Not on file  Social Connections:    Frequency of Communication with Friends and Family: Not on file   Frequency of Social Gatherings with Friends and Family: Not on file   Attends Religious Services: Not on file   Active Member of Clubs or Organizations: Not on file   Attends Archivist Meetings: Not on file   Marital Status: Not on file  Intimate Partner Violence:    Fear of Current  or Ex-Partner: Not on file   Emotionally Abused: Not on file   Physically Abused: Not on file   Sexually Abused: Not on file   Family History  Problem Relation Age of Onset   Heart attack Father    Heart disease Father    Cancer Brother     OBJECTIVE:  Vitals:   10/22/19 1849 10/22/19 1850  BP:  (!) 100/48  Pulse: 79   Resp: 19   Temp: 98.6 F (37 C)   TempSrc: Oral   SpO2: 92%   Weight: 210 lb (  95.3 kg)   Height: 5\' 4"  (1.626 m)     General appearance: ALERT; in no acute distress.  Head: NCAT Lungs: Normal respiratory effort Musculoskeletal: Back Inspection: Skin warm, dry, clear and intact without obvious erythema, effusion, or ecchymosis.  Palpation: TTP over RT mid back ROM: FROM active and passive Strength: 5/5 shld abduction, 5/5 shld adduction, 5/5 elbow flexion, 5/5 elbow extension, 5/5 grip strength, 5/5 hip flexion, 5/5 hip extension Skin: warm and dry Neurologic: Ambulates without difficulty; Sensation intact about the upper/ lower extremities Psychological: alert and cooperative; normal mood and affect  LABS: Results for orders placed or performed during the hospital encounter of 10/22/19 (from the past 24 hour(s))  POCT urinalysis dipstick     Status: Abnormal   Collection Time: 10/22/19  7:00 PM  Result Value Ref Range   Color, UA yellow yellow   Clarity, UA clear clear   Glucose, UA =500 (A) negative mg/dL   Bilirubin, UA negative negative   Ketones, POC UA negative negative mg/dL   Spec Grav, UA 1.020 1.010 - 1.025   Blood, UA trace-intact (A) negative   pH, UA 5.0 5.0 - 8.0   Protein Ur, POC negative negative mg/dL   Urobilinogen, UA 0.2 0.2 or 1.0 E.U./dL   Nitrite, UA Negative Negative   Leukocytes, UA Negative Negative      ASSESSMENT & PLAN:  1. Mid back pain on right side     Meds ordered this encounter  Medications   predniSONE (DELTASONE) 20 MG tablet    Sig: Take 1 tablet (20 mg total) by mouth 2 (two) times daily with  a meal for 5 days.    Dispense:  10 tablet    Refill:  0    Order Specific Question:   Supervising Provider    Answer:   Raylene Everts [[id number]]   tizanidine (ZANAFLEX) 2 MG capsule    Sig: Take 1 capsule (2 mg total) by mouth 3 (three) times daily.    Dispense:  30 capsule    Refill:  0    Order Specific Question:   Supervising Provider    Answer:   Raylene Everts [[id number]]   dexamethasone (DECADRON) injection 10 mg   Urine had trace intact blood Culture sent.  We will follow up with you regarding abnormal results Please have urine rechecked in 1-2 weeks to ensure blood has resolved Continue conservative management of rest, ice, and gentle stretches Steroid shot given in office Prednisone prescribed Take zanaflex at nighttime for symptomatic relief. Avoid driving or operating heavy machinery while using medication. Follow up with PCP if symptoms persist Return or go to the ER if you have any new or worsening symptoms (fever, chills, chest pain, abdominal pain, changes in bowel or bladder habits, pain radiating into lower legs, etc...)   Reviewed expectations re: course of current medical issues. Questions answered. Outlined signs and symptoms indicating need for more acute intervention. Patient verbalized understanding. After Visit Summary given.    Lestine Box, PA-C 10/22/19 1915

## 2019-10-22 NOTE — ED Triage Notes (Signed)
RT flank pain that radiates around to RT side that started yesterday morning. Denies burning or urinary frequency.

## 2019-10-22 NOTE — Discharge Instructions (Signed)
Urine had trace intact blood Culture sent.  We will follow up with you regarding abnormal results Please have urine rechecked in 1-2 weeks to ensure blood has resolved Continue conservative management of rest, ice, and gentle stretches Steroid shot given in office Prednisone prescribed Take zanaflex at nighttime for symptomatic relief. Avoid driving or operating heavy machinery while using medication. Follow up with PCP if symptoms persist Return or go to the ER if you have any new or worsening symptoms (fever, chills, chest pain, abdominal pain, changes in bowel or bladder habits, pain radiating into lower legs, etc...)

## 2019-10-23 LAB — URINE CULTURE

## 2019-10-27 ENCOUNTER — Other Ambulatory Visit: Payer: Self-pay

## 2019-10-27 ENCOUNTER — Emergency Department (HOSPITAL_COMMUNITY): Payer: BC Managed Care – PPO

## 2019-10-27 ENCOUNTER — Emergency Department (HOSPITAL_COMMUNITY)
Admission: EM | Admit: 2019-10-27 | Discharge: 2019-10-28 | Disposition: A | Discharge status: Home / Self Care | Payer: BC Managed Care – PPO | Attending: Emergency Medicine | Admitting: Emergency Medicine

## 2019-10-27 ENCOUNTER — Encounter (HOSPITAL_COMMUNITY): Payer: Self-pay

## 2019-10-27 DIAGNOSIS — F17210 Nicotine dependence, cigarettes, uncomplicated: Secondary | ICD-10-CM | POA: Insufficient documentation

## 2019-10-27 DIAGNOSIS — Z79899 Other long term (current) drug therapy: Secondary | ICD-10-CM | POA: Diagnosis not present

## 2019-10-27 DIAGNOSIS — R1031 Right lower quadrant pain: Secondary | ICD-10-CM | POA: Diagnosis present

## 2019-10-27 DIAGNOSIS — I110 Hypertensive heart disease with heart failure: Secondary | ICD-10-CM | POA: Insufficient documentation

## 2019-10-27 DIAGNOSIS — R109 Unspecified abdominal pain: Secondary | ICD-10-CM

## 2019-10-27 DIAGNOSIS — Z7984 Long term (current) use of oral hypoglycemic drugs: Secondary | ICD-10-CM | POA: Diagnosis not present

## 2019-10-27 DIAGNOSIS — M545 Low back pain, unspecified: Secondary | ICD-10-CM | POA: Insufficient documentation

## 2019-10-27 DIAGNOSIS — I5031 Acute diastolic (congestive) heart failure: Secondary | ICD-10-CM | POA: Insufficient documentation

## 2019-10-27 DIAGNOSIS — E119 Type 2 diabetes mellitus without complications: Secondary | ICD-10-CM | POA: Insufficient documentation

## 2019-10-27 DIAGNOSIS — J449 Chronic obstructive pulmonary disease, unspecified: Secondary | ICD-10-CM | POA: Insufficient documentation

## 2019-10-27 DIAGNOSIS — Z7982 Long term (current) use of aspirin: Secondary | ICD-10-CM | POA: Diagnosis not present

## 2019-10-27 LAB — COMPREHENSIVE METABOLIC PANEL
ALT: 24 U/L (ref 0–44)
AST: 17 U/L (ref 15–41)
Albumin: 3.6 g/dL (ref 3.5–5.0)
Alkaline Phosphatase: 51 U/L (ref 38–126)
Anion gap: 11 (ref 5–15)
BUN: 33 mg/dL — ABNORMAL HIGH (ref 8–23)
CO2: 25 mmol/L (ref 22–32)
Calcium: 8.9 mg/dL (ref 8.9–10.3)
Chloride: 102 mmol/L (ref 98–111)
Creatinine, Ser: 0.8 mg/dL (ref 0.44–1.00)
GFR calc Af Amer: >60 mL/min (ref >60)
GFR calc non Af Amer: >60 mL/min (ref >60)
Glucose, Bld: 191 mg/dL — ABNORMAL HIGH (ref 70–99)
Potassium: 4.4 mmol/L (ref 3.5–5.1)
Sodium: 138 mmol/L (ref 135–145)
Total Bilirubin: 0.4 mg/dL (ref 0.3–1.2)
Total Protein: 7 g/dL (ref 6.5–8.1)

## 2019-10-27 LAB — URINALYSIS, ROUTINE W REFLEX MICROSCOPIC
Bilirubin Urine: NEGATIVE
Glucose, UA: >=500 mg/dL — AB
Hgb urine dipstick: NEGATIVE
Ketones, ur: NEGATIVE mg/dL
Leukocytes,Ua: NEGATIVE
Nitrite: NEGATIVE
Protein, ur: NEGATIVE mg/dL
Specific Gravity, Urine: 1.028 (ref 1.005–1.030)
pH: 5 (ref 5.0–8.0)

## 2019-10-27 LAB — LIPASE, BLOOD: Lipase: 34 U/L (ref 11–51)

## 2019-10-27 LAB — CBC
HCT: 48.2 % — ABNORMAL HIGH (ref 36.0–46.0)
Hemoglobin: 15.9 g/dL — ABNORMAL HIGH (ref 12.0–15.0)
MCH: 31 pg (ref 26.0–34.0)
MCHC: 33 g/dL (ref 30.0–36.0)
MCV: 94 fL (ref 80.0–100.0)
Platelets: 250 10*3/uL (ref 150–400)
RBC: 5.13 MIL/uL — ABNORMAL HIGH (ref 3.87–5.11)
RDW: 13.2 % (ref 11.5–15.5)
WBC: 14.4 10*3/uL — ABNORMAL HIGH (ref 4.0–10.5)
nRBC: 0 % (ref 0.0–0.2)

## 2019-10-27 MED ORDER — HYDROCODONE-ACETAMINOPHEN 5-325 MG PO TABS: 1.0000 | ORAL_TABLET | ORAL | 0 refills | Status: DC | PRN

## 2019-10-27 MED ORDER — SODIUM CHLORIDE 0.9 % IV BOLUS
500.0000 mL | Freq: Once | INTRAVENOUS | Status: AC
  Administered 2019-10-27: 500 mL via INTRAVENOUS

## 2019-10-27 MED ORDER — VALACYCLOVIR HCL 1 G PO TABS: 1000.0000 mg | ORAL_TABLET | Freq: Three times a day (TID) | ORAL | 0 refills | Status: AC

## 2019-10-27 MED ORDER — ONDANSETRON HCL 4 MG/2ML IJ SOLN
4.0000 mg | Freq: Once | INTRAMUSCULAR | Status: AC
  Administered 2019-10-27: 4 mg via INTRAVENOUS
  Filled 2019-10-27: qty 2

## 2019-10-27 MED ORDER — FENTANYL CITRATE (PF) 100 MCG/2ML IJ SOLN
50.0000 ug | Freq: Once | INTRAMUSCULAR | Status: AC
  Administered 2019-10-27: 50 ug via INTRAVENOUS
  Filled 2019-10-27: qty 2

## 2019-10-27 NOTE — ED Triage Notes (Signed)
Pt to er, pt c/o back pain radiating around into her flank, states that she went to urgent care on Monday and was told that it might be a kidney stone and was put on prednisone, states that her pain continues to get worse.  Pt states that it is constant pain.  States that nothing seems to make it better or worse,  Took motrin without relief.  Reports nausea.  Pt reports some tightness in her chest.

## 2019-10-27 NOTE — Discharge Instructions (Addendum)
Your labs and CT scan do not give [place] the answer for the source of your pain.  It is possible that this is an early shingles flare as this can be very painful and tender to the touch as your exam was prior to the onset of the rash.  You are being prescribed Valtrex to treat you for this possibility.  You are also being prescribed hydrocodone which is a narcotic pain reliever.  Do not drive within 4 hours of taking this medication as it will make you drowsy.  Call your doctor for recheck if your symptoms are not improving or for any worsening symptoms.

## 2019-10-27 NOTE — ED Notes (Signed)
Pt to CT

## 2019-10-27 NOTE — ED Provider Notes (Signed)
[facility] EMERGENCY DEPARTMENT Provider Note   CSN: [id number] Arrival date & time: 10/27/19  1753     History Chief Complaint  Patient presents with  . Back Pain    [name] is a 64 y.o. female with a history of aortic stenosis, CAD with history of MI, COPD, hypertension, type 2 diabetes, CHF presenting for evaluation of right flank pain.  Her symptoms have been intermittent and started 1 week ago, describing dull aching pain which has progressed and is now constant and is sharp and stabbing in character.  It radiates around to her right lower abdomen.  She does report a history of sciatica but states this pain is in a different location and there is no radiation of pain into her leg.  She was seen at an urgent care center 5 days ago and a UA showed RBCs and she was told she probably has a kidney stone.  She also reports having some respiratory issues at that time, was placed on prednisone and she is no longer wheezing.  Last night her pain in her flank was severe she was unable to sleep, pain was worse lying down, stating she paced most of the night.  She denies fevers or chills, no nausea or vomiting no abdominal distention, no diarrhea.  She does not have a history of kidney stones.  She has had no other medications for treatment of her symptoms.   HPI     Past Medical History:  Diagnosis Date  . Anxiety   . Aortic stenosis    a. Mild by 2D ECHO 11/17/14  . CAD (coronary artery disease)    a. 10/2014: Ant STEMI s/p DES to dLAD  . Carotid disease, bilateral ([place])    a. Duplex 12/2014: mild-mod atherosclerotic plaque without hemodynamically significant stenosis.  [name] COPD (chronic obstructive pulmonary disease) ([place])   . Essential hypertension   . HLD (hyperlipidemia)   . Morbid obesity ([place])   . Myocardial infarction ([place]) 11/17/2014  . Shingles   . Sleep apnea    Uses CPAP  . Type 2 diabetes mellitus [facility])     Patient Active Problem List   Diagnosis Date Noted  .  Acute diastolic CHF (congestive heart failure) ([place]) 08/27/2018  . Acute respiratory failure with hypoxia ([place]) 08/27/2018  . Pleural effusion due to CHF (congestive heart failure) ([place]) 08/27/2018  . Hypokalemia 08/27/2018  . Abdominal wall cellulitis 08/25/2018  . Anasarca 08/25/2018  . Hypoxia 08/25/2018  . Backache 08/25/2018  . Anxiety 08/25/2018  . Morbid obesity ([place]) 01/14/2015  . CAD (coronary artery disease)   . COPD (chronic obstructive pulmonary disease) ([place])   . Hypertension   . DM type 2 causing vascular disease ([place])   . Current smoker   . HLD (hyperlipidemia)   . Aortic stenosis   . ST elevation (STEMI) myocardial infarction involving left anterior descending coronary artery ([place]) 11/17/2014    Past Surgical History:  Procedure Laterality Date  . ABDOMINAL HYSTERECTOMY    . BREAST BIOPSY Left    2016  . CARDIAC CATHETERIZATION N/A 11/17/2014   Procedure: Left Heart Cath and Coronary Angiography;  Surgeon: Wellington Hampshire, MD;  Location: [facility];  Service: Cardiovascular;  Laterality: N/A;  . CARDIAC CATHETERIZATION N/A 11/17/2014   Procedure: Coronary Stent Intervention;  Surgeon: Wellington Hampshire, MD;  Location: [place] [facility];  Service: Cardiovascular;  Laterality: N/A;  distal lad 2.25x20 promus  . CATARACT EXTRACTION W/PHACO Left 09/20/2016  Procedure: CATARACT EXTRACTION PHACO AND INTRAOCULAR LENS PLACEMENT (IOC);  Surgeon: Tonny Branch, MD;  Location: [facility];  Service: Ophthalmology;  Laterality: Left;  CDE: 9.27  . CATARACT EXTRACTION W/PHACO Right 10/18/2016   Procedure: CATARACT EXTRACTION PHACO AND INTRAOCULAR LENS PLACEMENT (IOC);  Surgeon: Tonny Branch, MD;  Location: [facility];  Service: Ophthalmology;  Laterality: Right;  CDE: 6.38  . CHOLECYSTECTOMY    . SPINAL FUSION     cervical; screws and plates.     OB History    Gravida  1   Para  1   Term  1   Preterm      AB      Living  1     SAB      TAB      Ectopic       Multiple      Live Births              Family History  Problem Relation Age of Onset  . Heart attack Father   . Heart disease Father   . Cancer Brother     Social History   Tobacco Use  . Smoking status: Current Some Day Smoker    Packs/day: 0.50    Years: 30.00    Pack years: 15.00    Types: Cigarettes    Start date: 12/26/1972  . Smokeless tobacco: Never Used  . Tobacco comment: 3 cigs per day  Vaping Use  . Vaping Use: Never used  Substance Use Topics  . Alcohol use: No    Alcohol/week: 0.0 standard drinks  . Drug use: No    Home Medications Prior to Admission medications   Medication Sig Start Date End Date Taking? Authorizing Provider  acetaminophen (TYLENOL) 500 MG tablet Take 500 mg by mouth daily as needed for moderate pain or headache.    [provider]  albuterol (PROVENTIL HFA;VENTOLIN HFA) 108 (90 Base) MCG/ACT inhaler Inhale 2 puffs into the lungs every 4 (four) hours as needed for wheezing or shortness of breath. 05/17/15   Francine Graven, DO  albuterol (PROVENTIL) (2.5 MG/3ML) 0.083% nebulizer solution Take 3 mLs (2.5 mg total) by nebulization every 4 (four) hours as needed for wheezing or shortness of breath. 05/17/15   Francine Graven, DO  aspirin EC 81 MG tablet Take 81 mg by mouth daily.    [provider]  atorvastatin (LIPITOR) 10 MG tablet TAKE 1 TABLET (10 MG TOTAL) BY MOUTH EVERY OTHER DAY. TAKE AT [facility] 09/25/18 04/17/19  Satira Sark, MD  dapagliflozin propanediol (FARXIGA) 10 MG TABS tablet Take 10 mg by mouth daily.    [provider]  escitalopram (LEXAPRO) 20 MG tablet Take 20 mg by mouth 2 (two) times a day.    [provider]  furosemide (LASIX) 20 MG tablet TAKE 1 TABLET BY MOUTH EVERY DAY 06/26/19   Satira Sark, MD  HYDROcodone-acetaminophen (NORCO/VICODIN) 5-325 MG tablet Take 1 tablet by mouth every 4 (four) hours as needed for moderate pain. 10/27/19   Evalee [facility], PA-C    HYDROcodone-acetaminophen (NORCO/VICODIN) 5-325 MG tablet Take 1 tablet by mouth every 4 (four) hours as needed. 10/27/19   Evalee [facility], PA-C  lisinopril (ZESTRIL) 10 MG tablet Take 1 tablet (10 mg total) by mouth daily. 06/26/19   Satira Sark, MD  metFORMIN (GLUCOPHAGE) 1000 MG tablet Take 1,000 mg by mouth 2 (two) times daily.  09/26/14   [provider]  metoprolol succinate (TOPROL-XL) 25 MG 24 hr tablet  TAKE 1 TABLET BY MOUTH EVERY DAY 09/24/19   Ahmed Prima, [place] M, PA-C  ondansetron (ZOFRAN ODT) 4 MG disintegrating tablet Take 1 tablet (4 mg total) by mouth every 8 (eight) hours as needed for nausea or vomiting. 10/28/19   Evalee [facility], PA-C  tizanidine (ZANAFLEX) 2 MG capsule Take 1 capsule (2 mg total) by mouth 3 (three) times daily. 10/22/19   [place], [place], PA-C  TRESIBA FLEXTOUCH 200 UNIT/ML FlexTouch Pen 40 Units 2 (two) times daily after a meal. 09/28/19   [provider]  valACYclovir (VALTREX) 1000 MG tablet Take 1 tablet (1,000 mg total) by mouth 3 (three) times daily for 7 days. 10/27/19 11/03/19  Evalee [facility], PA-C  Vitamin D, Ergocalciferol, (DRISDOL) 50000 units CAPS capsule Take 50,000 Units by mouth every Saturday. 08/30/16   [provider]    Allergies    Codeine and Sulfa antibiotics  Review of Systems   Review of Systems  Constitutional: Negative for chills and fever.  HENT: Negative for congestion and sore throat.   Eyes: Negative.   Respiratory: Negative for chest tightness and shortness of breath.   Cardiovascular: Negative for chest pain.  Gastrointestinal: Negative for abdominal distention, abdominal pain, nausea and vomiting.  Genitourinary: Positive for flank pain and hematuria. Negative for dysuria and urgency.  Musculoskeletal: Negative for arthralgias, joint swelling and neck pain.  Skin: Negative.  Negative for rash and wound.  Neurological: Negative for dizziness, weakness, light-headedness, numbness and headaches.   Psychiatric/Behavioral: Negative.     Physical Exam Updated Vital Signs BP 126/61   Pulse (!) 58   Temp 98.9 F (37.2 C) (Oral)   Resp 18   Ht 5\' 4"  (1.626 m)   Wt 95.3 kg   SpO2 96%   BMI 36.05 kg/m   Physical Exam Vitals and nursing note reviewed.  Constitutional:      Appearance: She is well-developed.  HENT:     Head: Normocephalic and atraumatic.  Eyes:     Conjunctiva/sclera: Conjunctivae normal.  Cardiovascular:     Rate and Rhythm: Normal rate and regular rhythm.     Heart sounds: Normal heart sounds.  Pulmonary:     Effort: Pulmonary effort is normal.     Breath sounds: Normal breath sounds. No wheezing.  Abdominal:     General: Abdomen is protuberant. Bowel sounds are normal.     Palpations: Abdomen is soft.     Tenderness: There is no abdominal tenderness. There is right CVA tenderness.     Comments: Pain elicited by even light touch to her skin right flank. No erythema, no hematoma, bruising or rash.   Musculoskeletal:        General: Normal range of motion.     Cervical back: Normal range of motion.  Skin:    General: Skin is warm and dry.  Neurological:     Mental Status: She is alert.     ED Results / Procedures / Treatments   Labs (all labs ordered are listed, but only abnormal results are displayed) Labs Reviewed  COMPREHENSIVE METABOLIC PANEL - Abnormal; Notable for the following components:      Result Value   Glucose, Bld 191 (*)    BUN 33 (*)    All other components within normal limits  CBC - Abnormal; Notable for the following components:   WBC 14.4 (*)    RBC 5.13 (*)    Hemoglobin 15.9 (*)    HCT 48.2 (*)    All other components within normal  limits  URINALYSIS, ROUTINE W REFLEX MICROSCOPIC - Abnormal; Notable for the following components:   APPearance HAZY (*)    Glucose, UA >=500 (*)    Bacteria, UA RARE (*)    All other components within normal limits  LIPASE, BLOOD    EKG EKG Interpretation  Date/Time:  Saturday  October 27 2019 18:19:20 EDT Ventricular Rate:  65 PR Interval:  116 QRS Duration: 86 QT Interval:  432 QTC Calculation: 449 R Axis:   94 Text Interpretation: Normal sinus rhythm Rightward axis Borderline ECG since last tracing no significant change Confirmed by Noemi Chapel [phone] on 10/27/2019 7:17:00 PM   Radiology CT Renal Stone Study  Result Date: 10/27/2019 CLINICAL DATA:  Bilateral lower flank pain and groin pain EXAM: CT ABDOMEN AND PELVIS WITHOUT CONTRAST TECHNIQUE: Multidetector CT imaging of the abdomen and pelvis was performed following the standard protocol without IV contrast. COMPARISON:  None. FINDINGS: Lower chest: The visualized heart size within normal limits. No pericardial fluid/thickening. No hiatal hernia. The visualized portions of the lungs are clear. Hepatobiliary: Although limited due to the lack of intravenous contrast, normal in appearance without gross focal abnormality. The patient is status post cholecystectomy. No biliary ductal dilation. Pancreas:  Unremarkable.  No surrounding inflammatory changes. Spleen: Normal in size. Although limited due to the lack of intravenous contrast, normal in appearance. Adrenals/Urinary Tract: Both adrenal glands appear normal. The kidneys and collecting system appear normal without evidence of urinary tract calculus or hydronephrosis. Bladder is decompressed. Stomach/Bowel: The stomach, small bowel, and colon are normal in appearance. No inflammatory changes or obstructive findings. Vascular/Lymphatic: There are no enlarged abdominal or pelvic lymph nodes. Scattered aortic atherosclerotic calcifications are seen without aneurysmal dilatation. Reproductive: The prostate is unremarkable. Other: Mild skin thickening seen over the bilateral lower abdominal with subcutaneous soft tissue stranding. There is also subcutaneous edema over the bilateral posterior gluteal soft tissues. Musculoskeletal: No acute or significant osseous findings.  IMPRESSION: No renal or collecting system calculi.  No hydronephrosis. No other acute intra-abdominal or pelvic pathology to explain the patient's symptoms. Aortic Atherosclerosis (ICD10-I70.0). Electronically Signed   By: Prudencio Pair M.D.   On: 10/27/2019 23:12    Procedures Procedures (including critical care time)  Medications Ordered in ED Medications  fentaNYL (SUBLIMAZE) injection 50 mcg (50 mcg Intravenous Given 10/27/19 2218)  ondansetron (ZOFRAN) injection 4 mg (4 mg Intravenous Given 10/27/19 2217)  sodium chloride 0.9 % bolus 500 mL (0 mLs Intravenous Stopped 10/27/19 2315)    ED Course  I have reviewed the triage vital signs and the nursing notes.  Pertinent labs & imaging results that were available during my care of the patient were reviewed by me and considered in my medical decision making (see chart for details).    MDM Rules/Calculators/A&P                          Labs and CT imaging reviewed and discussed with pt. Ct negative for renal/ureteral stone or any other obvious source of pain.  Abd soft, no guarding, no mass.  Given distribution and pain with even light touch (pt also endorses having her bra rub across the site is painful) suspicious for possible early shingles prodrome.  She does report prior hx of shingles of her right ear and face.  She was placed on valacyclovir , also hydrocodone for pain relief. Advised close f/u with pcp prn.  Final Clinical Impression(s) / ED Diagnoses Final diagnoses:  Acute  right flank pain    Rx / DC Orders ED Discharge Orders         Ordered    ondansetron (ZOFRAN ODT) 4 MG disintegrating tablet  Every 8 hours PRN        10/28/19 0011    HYDROcodone-acetaminophen (NORCO/VICODIN) 5-325 MG tablet  Every 4 hours PRN,   Status:  Discontinued        10/27/19 2354    valACYclovir (VALTREX) 1000 MG tablet  3 times daily        10/27/19 2354    HYDROcodone-acetaminophen (NORCO/VICODIN) 5-325 MG tablet  Every 4 hours PRN         10/27/19 2356    HYDROcodone-acetaminophen (NORCO/VICODIN) 5-325 MG tablet  Every 4 hours PRN        10/28/19 0000           Evalee [facility], PA-C 10/28/19 1704    Noemi Chapel, MD 10/30/19 [phone]

## 2019-10-28 MED ORDER — ONDANSETRON 4 MG PO TBDP: 4.0000 mg | ORAL_TABLET | Freq: Three times a day (TID) | ORAL | 0 refills | Status: DC | PRN

## 2019-10-28 MED ORDER — HYDROCODONE-ACETAMINOPHEN 5-325 MG PO TABS: 1.0000 | ORAL_TABLET | ORAL | 0 refills | Status: DC | PRN

## 2019-10-29 MED FILL — Hydrocodone-Acetaminophen Tab 5-325 MG: ORAL | Qty: 6 | Status: AC

## 2019-11-13 ENCOUNTER — Telehealth: Payer: Self-pay

## 2019-11-13 NOTE — Telephone Encounter (Signed)
Received this message message from [employer].  Soundra Pilon,   I have called Caty Tessler on 10/26/2019, 10/30/2019, and 11/01/2019, with no response and left a voicemail each time. I also emailed her on 11/01/2019 letting her know that we have been trying to reach her to go over some information regarding her CPAP order and to get her scheduled. No response via email as well.   Order has been voided.   Patient's name: [name] DOB: [birth year]  Thanks!

## 2019-11-25 ENCOUNTER — Ambulatory Visit
Admission: RE | Admit: 2019-11-25 | Discharge: 2019-11-25 | Disposition: A | Discharge status: Home / Self Care | Payer: BC Managed Care – PPO | Source: Ambulatory Visit | Attending: Emergency Medicine | Admitting: Emergency Medicine

## 2019-11-25 ENCOUNTER — Other Ambulatory Visit: Payer: Self-pay

## 2019-11-25 VITALS — BP 133/76 | HR 74 | Temp 98.5°F | Resp 20

## 2019-11-25 DIAGNOSIS — J069 Acute upper respiratory infection, unspecified: Secondary | ICD-10-CM

## 2019-11-25 DIAGNOSIS — Z1152 Encounter for screening for COVID-19: Secondary | ICD-10-CM

## 2019-11-25 DIAGNOSIS — R059 Cough, unspecified: Secondary | ICD-10-CM

## 2019-11-25 MED ORDER — BENZONATATE 100 MG PO CAPS: 100.0000 mg | ORAL_CAPSULE | Freq: Three times a day (TID) | ORAL | 0 refills | Status: DC

## 2019-11-25 NOTE — ED Provider Notes (Signed)
[name]   [id number] 11/25/19 Arrival Time: 0347   CC: COVID symptoms  SUBJECTIVE: History from: patient.  [name] is a 64 y.o. female who presents with cough and nasal congestion x 1 day.  Denies sick exposure to COVID, flu or strep.  Has tried OTC medications without relief.  Denies aggravating factors.  Denies previous COVID infection in the past.   Denies fever, chills, SOB, wheezing, chest pain, nausea, changes in bowel or bladder habits.     ROS: As per HPI.  All other pertinent ROS negative.     Past Medical History:  Diagnosis Date  . Anxiety   . Aortic stenosis    a. Mild by 2D ECHO 11/17/14  . CAD (coronary artery disease)    a. 10/2014: Ant STEMI s/p DES to dLAD  . Carotid disease, bilateral ([place])    a. Duplex 12/2014: mild-mod atherosclerotic plaque without hemodynamically significant stenosis.  [name] COPD (chronic obstructive pulmonary disease) ([place])   . Essential hypertension   . HLD (hyperlipidemia)   . Morbid obesity ([place])   . Myocardial infarction ([place]) 11/17/2014  . Shingles   . Sleep apnea    Uses CPAP  . Type 2 diabetes mellitus ([place])    Past Surgical History:  Procedure Laterality Date  . ABDOMINAL HYSTERECTOMY    . BREAST BIOPSY Left    2016  . CARDIAC CATHETERIZATION N/A 11/17/2014   Procedure: Left Heart Cath and Coronary Angiography;  Surgeon: Wellington [place], MD;  Location: [facility];  Service: Cardiovascular;  Laterality: N/A;  . CARDIAC CATHETERIZATION N/A 11/17/2014   Procedure: Coronary Stent Intervention;  Surgeon: Wellington [place], MD;  Location: [place];  Service: Cardiovascular;  Laterality: N/A;  distal lad 2.25x20 promus  . CATARACT EXTRACTION W/PHACO Left 09/20/2016   Procedure: CATARACT EXTRACTION PHACO AND INTRAOCULAR LENS PLACEMENT (IOC);  Surgeon: Tonny Branch, MD;  Location: [facility];  Service: Ophthalmology;  Laterality: Left;  CDE: 9.27  . CATARACT EXTRACTION W/PHACO Right 10/18/2016    Procedure: CATARACT EXTRACTION PHACO AND INTRAOCULAR LENS PLACEMENT (IOC);  Surgeon: Tonny Branch, MD;  Location: [facility];  Service: Ophthalmology;  Laterality: Right;  CDE: 6.38  . CHOLECYSTECTOMY    . SPINAL FUSION     cervical; screws and plates.   Allergies  Allergen Reactions  . Codeine Nausea And Vomiting  . Sulfa Antibiotics Rash   No current facility-administered medications on file prior to encounter.   Current Outpatient Medications on File Prior to Encounter  Medication Sig Dispense Refill  . acetaminophen (TYLENOL) 500 MG tablet Take 500 mg by mouth daily as needed for moderate pain or headache.    . albuterol (PROVENTIL HFA;VENTOLIN HFA) 108 (90 Base) MCG/ACT inhaler Inhale 2 puffs into the lungs every 4 (four) hours as needed for wheezing or shortness of breath. 1 Inhaler 0  . albuterol (PROVENTIL) (2.5 MG/3ML) 0.083% nebulizer solution Take 3 mLs (2.5 mg total) by nebulization every 4 (four) hours as needed for wheezing or shortness of breath. 75 mL 0  . aspirin EC 81 MG tablet Take 81 mg by mouth daily.    [name] atorvastatin (LIPITOR) 10 MG tablet TAKE 1 TABLET (10 MG TOTAL) BY MOUTH EVERY OTHER DAY. TAKE AT DINNER 45 tablet 3  . dapagliflozin propanediol (FARXIGA) 10 MG TABS tablet Take 10 mg by mouth daily.    [name] escitalopram (LEXAPRO) 20 MG tablet Take 20 mg by mouth 2 (two) times a day.    [name]  furosemide (LASIX) 20 MG tablet TAKE 1 TABLET BY MOUTH EVERY DAY 90 tablet 3  . HYDROcodone-acetaminophen (NORCO/VICODIN) 5-325 MG tablet Take 1 tablet by mouth every 4 (four) hours as needed for moderate pain. 15 tablet 0  . HYDROcodone-acetaminophen (NORCO/VICODIN) 5-325 MG tablet Take 1 tablet by mouth every 4 (four) hours as needed. 6 tablet 0  . lisinopril (ZESTRIL) 10 MG tablet Take 1 tablet (10 mg total) by mouth daily. 90 tablet 1  . metFORMIN (GLUCOPHAGE) 1000 MG tablet Take 1,000 mg by mouth 2 (two) times daily.   6  . metoprolol succinate (TOPROL-XL) 25 MG 24 hr tablet TAKE 1  TABLET BY MOUTH EVERY DAY 90 tablet 1  . ondansetron (ZOFRAN ODT) 4 MG disintegrating tablet Take 1 tablet (4 mg total) by mouth every 8 (eight) hours as needed for nausea or vomiting. 20 tablet 0  . tizanidine (ZANAFLEX) 2 MG capsule Take 1 capsule (2 mg total) by mouth 3 (three) times daily. 30 capsule 0  . TRESIBA FLEXTOUCH 200 UNIT/ML FlexTouch Pen 40 Units 2 (two) times daily after a meal.    . Vitamin D, Ergocalciferol, (DRISDOL) 50000 units CAPS capsule Take 50,000 Units by mouth every Saturday.  12   Social History   Socioeconomic History  . Marital status: Widowed    Spouse name: Not on file  . Number of children: Not on file  . Years of education: Not on file  . Highest education level: Not on file  Occupational History  . Occupation: Marketing executive work for [employer]  Tobacco Use  . Smoking status: Current Some Day Smoker    Packs/day: 0.50    Years: 30.00    Pack years: 15.00    Types: Cigarettes    Start date: 12/26/1972  . Smokeless tobacco: Never Used  . Tobacco comment: 3 cigs per day  Vaping Use  . Vaping Use: Never used  Substance and Sexual Activity  . Alcohol use: No    Alcohol/week: 0.0 standard drinks  . Drug use: No  . Sexual activity: Yes    Partners: Male    Birth control/protection: Surgical  Other Topics Concern  . Not on file  Social History Narrative  . Not on file   Social Determinants of Health   Financial Resource Strain:   . Difficulty of Paying Living Expenses: Not on file  Food Insecurity:   . Worried About Charity fundraiser in the Last Year: Not on file  . Ran Out of Food in the Last Year: Not on file  Transportation Needs:   . Lack of Transportation (Medical): Not on file  . Lack of Transportation (Non-Medical): Not on file  Physical Activity:   . Days of Exercise per Week: Not on file  . Minutes of Exercise per Session: Not on file  Stress:   . Feeling of Stress : Not on file  Social Connections:   . Frequency of  Communication with Friends and Family: Not on file  . Frequency of Social Gatherings with Friends and Family: Not on file  . Attends Religious Services: Not on file  . Active Member of Clubs or Organizations: Not on file  . Attends Archivist Meetings: Not on file  . Marital Status: Not on file  Intimate Partner Violence:   . Fear of Current or Ex-Partner: Not on file  . Emotionally Abused: Not on file  . Physically Abused: Not on file  . Sexually Abused: Not on file   Family History  Problem Relation Age of Onset  . Heart attack Father   . Heart disease Father   . Cancer Brother     OBJECTIVE:  Vitals:   11/25/19 1401  BP: 133/76  Pulse: 74  Resp: 20  Temp: 98.5 F (36.9 C)  SpO2: 94%    General appearance: alert; appears fatigued, but nontoxic; speaking in full sentences and tolerating own secretions HEENT: NCAT; Ears: EACs clear, TMs pearly gray; Eyes: PERRL.  EOM grossly intact. Nose: nares patent without rhinorrhea, Throat: oropharynx clear, tonsils non erythematous or enlarged, uvula midline  Neck: supple without LAD Lungs: unlabored respirations, symmetrical air entry; cough: mild; no respiratory distress; CTAB Heart: regular rate and rhythm.   Skin: warm and dry Psychological: alert and cooperative; normal mood and affect  ASSESSMENT & PLAN:  1. Encounter for screening for COVID-19   2. Cough   3. Viral URI with cough     Meds ordered this encounter  Medications  . benzonatate (TESSALON) 100 MG capsule    Sig: Take 1 capsule (100 mg total) by mouth every 8 (eight) hours.    Dispense:  21 capsule    Refill:  0    Order Specific Question:   Supervising Provider    Answer:   Raylene Everts [[id number]]   COVID testing ordered.  It will take between 5-7 days for test results.  Someone will contact you regarding abnormal results.    In the meantime: You should remain isolated in your home for 10 days from symptom onset AND greater than 72 hours  after symptoms resolution (absence of fever without the use of fever-reducing medication and improvement in respiratory symptoms), whichever is longer Get plenty of rest and push fluids Tessalon Perles prescribed for cough Use OTC zyrtec for nasal congestion, runny nose, and/or sore throat Use OTC flonase for nasal congestion and runny nose Use medications daily for symptom relief Use OTC medications like ibuprofen or tylenol as needed fever or pain Call or go to the ED if you have any new or worsening symptoms such as fever, worsening cough, shortness of breath, chest tightness, chest pain, turning blue, changes in mental status, etc...   Reviewed expectations re: course of current medical issues. Questions answered. Outlined signs and symptoms indicating need for more acute intervention. Patient verbalized understanding. After Visit Summary given.         Lestine Box, PA-C 11/25/19 1425

## 2019-11-25 NOTE — Discharge Instructions (Signed)

## 2019-11-25 NOTE — ED Triage Notes (Signed)
Pt presents with nasal congestion and cough that began yesterday

## 2019-11-26 LAB — NOVEL CORONAVIRUS, NAA: SARS-CoV-2, NAA: NOT DETECTED

## 2019-11-26 LAB — SARS-COV-2, NAA 2 DAY TAT

## 2019-12-21 ENCOUNTER — Other Ambulatory Visit: Payer: Self-pay | Admitting: Cardiology

## 2019-12-27 ENCOUNTER — Other Ambulatory Visit: Payer: BC Managed Care – PPO

## 2019-12-27 DIAGNOSIS — Z20822 Contact with and (suspected) exposure to covid-19: Secondary | ICD-10-CM

## 2019-12-28 LAB — SARS-COV-2, NAA 2 DAY TAT

## 2019-12-28 LAB — NOVEL CORONAVIRUS, NAA: SARS-CoV-2, NAA: NOT DETECTED

## 2020-01-07 ENCOUNTER — Other Ambulatory Visit: Payer: Self-pay | Admitting: Cardiology

## 2020-01-31 ENCOUNTER — Other Ambulatory Visit: Payer: BC Managed Care – PPO

## 2020-02-04 ENCOUNTER — Other Ambulatory Visit: Payer: Self-pay

## 2020-02-04 ENCOUNTER — Ambulatory Visit
Admission: RE | Admit: 2020-02-04 | Discharge: 2020-02-04 | Disposition: A | Discharge status: Home / Self Care | Payer: BC Managed Care – PPO | Source: Ambulatory Visit | Attending: Emergency Medicine | Admitting: Emergency Medicine

## 2020-02-04 VITALS — BP 98/63 | HR 75 | Temp 98.3°F | Resp 18 | Ht 64.0 in | Wt 216.0 lb

## 2020-02-04 DIAGNOSIS — Z1152 Encounter for screening for COVID-19: Secondary | ICD-10-CM | POA: Diagnosis not present

## 2020-02-04 DIAGNOSIS — I2510 Atherosclerotic heart disease of native coronary artery without angina pectoris: Secondary | ICD-10-CM | POA: Diagnosis not present

## 2020-02-04 DIAGNOSIS — Z0000 Encounter for general adult medical examination without abnormal findings: Secondary | ICD-10-CM | POA: Diagnosis not present

## 2020-02-04 DIAGNOSIS — R5383 Other fatigue: Secondary | ICD-10-CM | POA: Diagnosis not present

## 2020-02-04 DIAGNOSIS — E7800 Pure hypercholesterolemia, unspecified: Secondary | ICD-10-CM | POA: Diagnosis not present

## 2020-02-04 DIAGNOSIS — J069 Acute upper respiratory infection, unspecified: Secondary | ICD-10-CM | POA: Diagnosis not present

## 2020-02-04 DIAGNOSIS — Z79899 Other long term (current) drug therapy: Secondary | ICD-10-CM | POA: Diagnosis not present

## 2020-02-04 DIAGNOSIS — Z20822 Contact with and (suspected) exposure to covid-19: Secondary | ICD-10-CM | POA: Diagnosis not present

## 2020-02-04 MED ORDER — BENZONATATE 100 MG PO CAPS: 100.0000 mg | ORAL_CAPSULE | Freq: Three times a day (TID) | ORAL | 0 refills | Status: DC | PRN

## 2020-02-04 MED ORDER — DEXAMETHASONE 4 MG PO TABS: 4.0000 mg | ORAL_TABLET | Freq: Every day | ORAL | 0 refills | Status: AC

## 2020-02-04 MED ORDER — CETIRIZINE HCL 10 MG PO TABS: 10.0000 mg | ORAL_TABLET | Freq: Every day | ORAL | 0 refills | Status: DC

## 2020-02-04 MED ORDER — FLUTICASONE PROPIONATE 50 MCG/ACT NA SUSP: 1.0000 | Freq: Every day | NASAL | 0 refills | Status: DC

## 2020-02-04 NOTE — ED Notes (Signed)
Pt states daughter and son in law both tested positive for covid

## 2020-02-04 NOTE — Discharge Instructions (Signed)
COVID testing ordered.  It will take between 2-7 days for test results.  Someone will contact you regarding abnormal results.    Get plenty of rest and push fluids Tessalon Perles prescribed for cough Zyrtec for nasal congestion, runny nose, and/or sore throat Flonase for nasal congestion and runny nose Decadron was prescribed Use medications daily for symptom relief Use OTC medications like ibuprofen or tylenol as needed fever or pain Call or go to the ED if you have any new or worsening symptoms such as fever, worsening cough, shortness of breath, chest tightness, chest pain, turning blue, changes in mental status, etc...  

## 2020-02-04 NOTE — ED Provider Notes (Signed)
[place]   [id number] 02/04/20 Arrival Time: [id number]   CC: COVID symptoms  SUBJECTIVE: History from: patient.  [name] is a 65 y.o. female w who presented to the urgent care with a complaint of nasal congestion, cough and headache that started yesterday.  Denies sick exposure to COVID, flu or strep.  Denies recent travel.  Has tried OTC medication without relief.  Denies alleviating or aggravating factors.  Denies previous symptoms in the past.   Denies fever, chills, fatigue, sinus pain, rhinorrhea, sore throat, SOB, wheezing, chest pain, nausea, changes in bowel or bladder habits.     ROS: As per HPI.  All other pertinent ROS negative.     Past Medical History:  Diagnosis Date  . Anxiety   . Aortic stenosis    a. Mild by 2D ECHO 11/17/14  . CAD (coronary artery disease)    a. 10/2014: Ant STEMI s/p DES to dLAD  . Carotid disease, bilateral ([place])    a. Duplex 12/2014: mild-mod atherosclerotic plaque without hemodynamically significant stenosis.  [name] COPD (chronic obstructive pulmonary disease) ([place])   . Essential hypertension   . HLD (hyperlipidemia)   . Morbid obesity ([place])   . Myocardial infarction ([place]) 11/17/2014  . Shingles   . Sleep apnea    Uses CPAP  . Type 2 diabetes mellitus ([place])    Past Surgical History:  Procedure Laterality Date  . ABDOMINAL HYSTERECTOMY    . BREAST BIOPSY Left    2016  . CARDIAC CATHETERIZATION N/A 11/17/2014   Procedure: Left Heart Cath and Coronary Angiography;  Surgeon: Wellington Hampshire, MD;  Location: [facility];  Service: Cardiovascular;  Laterality: N/A;  . CARDIAC CATHETERIZATION N/A 11/17/2014   Procedure: Coronary Stent Intervention;  Surgeon: Wellington Hampshire, MD;  Location: [facility];  Service: Cardiovascular;  Laterality: N/A;  distal lad 2.25x20 promus  . CATARACT EXTRACTION W/PHACO Left 09/20/2016   Procedure: CATARACT EXTRACTION PHACO AND INTRAOCULAR LENS PLACEMENT (IOC);  Surgeon: Tonny Branch, MD;  Location: [facility];  Service: Ophthalmology;  Laterality: Left;  CDE: 9.27  . CATARACT EXTRACTION W/PHACO Right 10/18/2016   Procedure: CATARACT EXTRACTION PHACO AND INTRAOCULAR LENS PLACEMENT (IOC);  Surgeon: Tonny Branch, MD;  Location: [facility];  Service: Ophthalmology;  Laterality: Right;  CDE: 6.38  . CHOLECYSTECTOMY    . SPINAL FUSION     cervical; screws and plates.   Allergies  Allergen Reactions  . Codeine Nausea And Vomiting  . Sulfa Antibiotics Rash   No current facility-administered medications on file prior to encounter.   Current Outpatient Medications on File Prior to Encounter  Medication Sig Dispense Refill  . acetaminophen (TYLENOL) 500 MG tablet Take 500 mg by mouth daily as needed for moderate pain or headache.    . albuterol (PROVENTIL HFA;VENTOLIN HFA) 108 (90 Base) MCG/ACT inhaler Inhale 2 puffs into the lungs every 4 (four) hours as needed for wheezing or shortness of breath. 1 Inhaler 0  . albuterol (PROVENTIL) (2.5 MG/3ML) 0.083% nebulizer solution Take 3 mLs (2.5 mg total) by nebulization every 4 (four) hours as needed for wheezing or shortness of breath. 75 mL 0  . aspirin EC 81 MG tablet Take 81 mg by mouth daily.    [name] atorvastatin (LIPITOR) 10 MG tablet TAKE 1 TABLET (10 MG TOTAL) BY MOUTH EVERY OTHER DAY. TAKE AT DINNER 45 tablet 3  . dapagliflozin propanediol (FARXIGA) 10 MG TABS tablet Take 10 mg by mouth daily.    [name]  escitalopram (LEXAPRO) 20 MG tablet Take 20 mg by mouth 2 (two) times a day.    . furosemide (LASIX) 20 MG tablet TAKE 1 TABLET BY MOUTH EVERY DAY 90 tablet 3  . HYDROcodone-acetaminophen (NORCO/VICODIN) 5-325 MG tablet Take 1 tablet by mouth every 4 (four) hours as needed for moderate pain. 15 tablet 0  . HYDROcodone-acetaminophen (NORCO/VICODIN) 5-325 MG tablet Take 1 tablet by mouth every 4 (four) hours as needed. 6 tablet 0  . lisinopril (ZESTRIL) 10 MG tablet TAKE 1 TABLET BY MOUTH EVERY DAY 30 tablet 0  . metFORMIN (GLUCOPHAGE) 1000  MG tablet Take 1,000 mg by mouth 2 (two) times daily.   6  . metoprolol succinate (TOPROL-XL) 25 MG 24 hr tablet TAKE 1 TABLET BY MOUTH EVERY DAY 90 tablet 1  . ondansetron (ZOFRAN ODT) 4 MG disintegrating tablet Take 1 tablet (4 mg total) by mouth every 8 (eight) hours as needed for nausea or vomiting. 20 tablet 0  . tizanidine (ZANAFLEX) 2 MG capsule Take 1 capsule (2 mg total) by mouth 3 (three) times daily. 30 capsule 0  . TRESIBA FLEXTOUCH 200 UNIT/ML FlexTouch Pen 40 Units 2 (two) times daily after a meal.    . Vitamin D, Ergocalciferol, (DRISDOL) 50000 units CAPS capsule Take 50,000 Units by mouth every Saturday.  12   Social History   Socioeconomic History  . Marital status: Widowed    Spouse name: Not on file  . Number of children: Not on file  . Years of education: Not on file  . Highest education level: Not on file  Occupational History  . Occupation: Marketing executive work for [employer]  Tobacco Use  . Smoking status: Current Some Day Smoker    Packs/day: 0.50    Years: 30.00    Pack years: 15.00    Types: Cigarettes    Start date: 12/26/1972  . Smokeless tobacco: Never Used  . Tobacco comment: 3 cigs per day  Vaping Use  . Vaping Use: Never used  Substance and Sexual Activity  . Alcohol use: No    Alcohol/week: 0.0 standard drinks  . Drug use: No  . Sexual activity: Yes    Partners: Male    Birth control/protection: Surgical  Other Topics Concern  . Not on file  Social History Narrative  . Not on file   Social Determinants of Health   Financial Resource Strain: Not on file  Food Insecurity: Not on file  Transportation Needs: Not on file  Physical Activity: Not on file  Stress: Not on file  Social Connections: Not on file  Intimate Partner Violence: Not on file   Family History  Problem Relation Age of Onset  . Heart attack Father   . Heart disease Father   . Cancer Brother     OBJECTIVE:  Vitals:   02/04/20 1605  BP: 98/63  Pulse: 75  Resp:  18  Temp: 98.3 F (36.8 C)  TempSrc: Oral  SpO2: 91%  Weight: 216 lb (98 kg)  Height: 5\' 4"  (1.626 m)     General appearance: alert; appears fatigued, but nontoxic; speaking in full sentences and tolerating own secretions HEENT: NCAT; Ears: EACs clear, TMs pearly gray; Eyes: PERRL.  EOM grossly intact. Sinuses: nontender; Nose: nares patent without rhinorrhea, Throat: oropharynx clear, tonsils non erythematous or enlarged, uvula midline  Neck: supple without LAD Lungs: unlabored respirations, symmetrical air entry; cough:  mild; no respiratory distress; CTAB Heart: regular rate and rhythm.  Radial pulses 2+ symmetrical bilaterally Skin:  warm and dry Psychological: alert and cooperative; normal mood and affect  LABS:  No results found for this or any previous visit (from the past 24 hour(s)).   ASSESSMENT & PLAN:  1. Encounter for screening for COVID-19   2. URI with cough and congestion     Meds ordered this encounter  Medications  . fluticasone (FLONASE) 50 MCG/ACT nasal spray    Sig: Place 1 spray into both nostrils daily for 14 days.    Dispense:  16 g    Refill:  0  . cetirizine (ZYRTEC ALLERGY) 10 MG tablet    Sig: Take 1 tablet (10 mg total) by mouth daily.    Dispense:  30 tablet    Refill:  0  . dexamethasone (DECADRON) 4 MG tablet    Sig: Take 1 tablet (4 mg total) by mouth daily for 7 days.    Dispense:  7 tablet    Refill:  0  . benzonatate (TESSALON) 100 MG capsule    Sig: Take 1 capsule (100 mg total) by mouth 3 (three) times daily as needed for cough.    Dispense:  30 capsule    Refill:  0    Discharge instructions  COVID testing ordered.  It will take between 2-7 days for test results.  Someone will contact you regarding abnormal results.    Get plenty of rest and push fluids Tessalon Perles prescribed for cough Zyrtec for nasal congestion, runny nose, and/or sore throat Flonase for nasal congestion and runny nose Decadron was prescribed Use  medications daily for symptom relief Use OTC medications like ibuprofen or tylenol as needed fever or pain Call or go to the ED if you have any new or worsening symptoms such as fever, worsening cough, shortness of breath, chest tightness, chest pain, turning blue, changes in mental status, etc...   Reviewed expectations re: course of current medical issues. Questions answered. Outlined signs and symptoms indicating need for more acute intervention. Patient verbalized understanding. After Visit Summary given.         Emerson Monte, [place] 02/04/20 1656

## 2020-02-04 NOTE — ED Triage Notes (Signed)
Headache and chest congestion started yesterday, non productive cough

## 2020-02-05 ENCOUNTER — Ambulatory Visit: Payer: BC Managed Care – PPO | Admitting: Cardiology

## 2020-02-05 ENCOUNTER — Encounter: Payer: Self-pay | Admitting: Cardiology

## 2020-02-05 NOTE — Progress Notes (Deleted)
Cardiology Office Note  Date: 02/05/2020   ID: Morgan, Aguilar 1955-11-22, MRN [id number]  PCP:  Monico Blitz, MD  Cardiologist:  Rozann Lesches, MD Electrophysiologist:  None   No chief complaint on file.   History of Present Illness: [name] is a 65 y.o. female last seen in March 2021  Past Medical History:  Diagnosis Date  . Anxiety   . Aortic stenosis   . CAD (coronary artery disease)    a. 10/2014: Ant STEMI s/p DES to dLAD  . Carotid disease, bilateral ([place])    a. Duplex 12/2014: mild-mod atherosclerotic plaque without hemodynamically significant stenosis.  [name] COPD (chronic obstructive pulmonary disease) ([place])   . Essential hypertension   . HLD (hyperlipidemia)   . Morbid obesity ([place])   . Myocardial infarction ([place]) 11/17/2014  . Shingles   . Sleep apnea    Uses CPAP  . Type 2 diabetes mellitus ([place])     Past Surgical History:  Procedure Laterality Date  . ABDOMINAL HYSTERECTOMY    . BREAST BIOPSY Left    2016  . CARDIAC CATHETERIZATION N/A 11/17/2014   Procedure: Left Heart Cath and Coronary Angiography;  Surgeon: Wellington Hampshire, MD;  Location: [facility];  Service: Cardiovascular;  Laterality: N/A;  . CARDIAC CATHETERIZATION N/A 11/17/2014   Procedure: Coronary Stent Intervention;  Surgeon: Wellington Hampshire, MD;  Location: [facility];  Service: Cardiovascular;  Laterality: N/A;  distal lad 2.25x20 promus  . CATARACT EXTRACTION W/PHACO Left 09/20/2016   Procedure: CATARACT EXTRACTION PHACO AND INTRAOCULAR LENS PLACEMENT (IOC);  Surgeon: Tonny Branch, MD;  Location: [facility];  Service: Ophthalmology;  Laterality: Left;  CDE: 9.27  . CATARACT EXTRACTION W/PHACO Right 10/18/2016   Procedure: CATARACT EXTRACTION PHACO AND INTRAOCULAR LENS PLACEMENT (IOC);  Surgeon: Tonny Branch, MD;  Location: [facility];  Service: Ophthalmology;  Laterality: Right;  CDE: 6.38  . CHOLECYSTECTOMY    . SPINAL FUSION     cervical; screws and plates.     Current Outpatient Medications  Medication Sig Dispense Refill  . acetaminophen (TYLENOL) 500 MG tablet Take 500 mg by mouth daily as needed for moderate pain or headache.    . albuterol (PROVENTIL HFA;VENTOLIN HFA) 108 (90 Base) MCG/ACT inhaler Inhale 2 puffs into the lungs every 4 (four) hours as needed for wheezing or shortness of breath. 1 Inhaler 0  . albuterol (PROVENTIL) (2.5 MG/3ML) 0.083% nebulizer solution Take 3 mLs (2.5 mg total) by nebulization every 4 (four) hours as needed for wheezing or shortness of breath. 75 mL 0  . aspirin EC 81 MG tablet Take 81 mg by mouth daily.    [name] atorvastatin (LIPITOR) 10 MG tablet TAKE 1 TABLET (10 MG TOTAL) BY MOUTH EVERY OTHER DAY. TAKE AT DINNER 45 tablet 3  . benzonatate (TESSALON) 100 MG capsule Take 1 capsule (100 mg total) by mouth 3 (three) times daily as needed for cough. 30 capsule 0  . cetirizine (ZYRTEC ALLERGY) 10 MG tablet Take 1 tablet (10 mg total) by mouth daily. 30 tablet 0  . dapagliflozin propanediol (FARXIGA) 10 MG TABS tablet Take 10 mg by mouth daily.    [name] dexamethasone (DECADRON) 4 MG tablet Take 1 tablet (4 mg total) by mouth daily for 7 days. 7 tablet 0  . escitalopram (LEXAPRO) 20 MG tablet Take 20 mg by mouth 2 (two) times a day.    . fluticasone (FLONASE) 50 MCG/ACT nasal spray Place 1 spray into both nostrils  daily for 14 days. 16 g 0  . furosemide (LASIX) 20 MG tablet TAKE 1 TABLET BY MOUTH EVERY DAY 90 tablet 3  . HYDROcodone-acetaminophen (NORCO/VICODIN) 5-325 MG tablet Take 1 tablet by mouth every 4 (four) hours as needed for moderate pain. 15 tablet 0  . HYDROcodone-acetaminophen (NORCO/VICODIN) 5-325 MG tablet Take 1 tablet by mouth every 4 (four) hours as needed. 6 tablet 0  . lisinopril (ZESTRIL) 10 MG tablet TAKE 1 TABLET BY MOUTH EVERY DAY 30 tablet 0  . metFORMIN (GLUCOPHAGE) 1000 MG tablet Take 1,000 mg by mouth 2 (two) times daily.   6  . metoprolol succinate (TOPROL-XL) 25 MG 24 hr tablet TAKE 1 TABLET BY  MOUTH EVERY DAY 90 tablet 1  . ondansetron (ZOFRAN ODT) 4 MG disintegrating tablet Take 1 tablet (4 mg total) by mouth every 8 (eight) hours as needed for nausea or vomiting. 20 tablet 0  . tizanidine (ZANAFLEX) 2 MG capsule Take 1 capsule (2 mg total) by mouth 3 (three) times daily. 30 capsule 0  . TRESIBA FLEXTOUCH 200 UNIT/ML FlexTouch Pen 40 Units 2 (two) times daily after a meal.    . Vitamin D, Ergocalciferol, (DRISDOL) 50000 units CAPS capsule Take 50,000 Units by mouth every Saturday.  12   No current facility-administered medications for this visit.   Allergies:  Codeine and Sulfa antibiotics   Social History: The patient  reports that she has been smoking cigarettes. She started smoking about 47 years ago. She has a 15.00 pack-year smoking history. She has never used smokeless tobacco. She reports that she does not drink alcohol and does not use drugs.   Family History: The patient's family history includes Cancer in her brother; Heart attack in her father; Heart disease in her father.   ROS:  Please see the history of present illness. Otherwise, complete review of systems is positive for {NONE DEFAULTED:18576::"none"}.  All other systems are reviewed and negative.   Physical Exam: VS:  There were no vitals taken for this visit., BMI There is no height or weight on file to calculate BMI.  Wt Readings from Last 3 Encounters:  02/04/20 216 lb (98 kg)  10/27/19 210 lb (95.3 kg)  10/22/19 210 lb (95.3 kg)    General: Patient appears comfortable at rest. HEENT: Conjunctiva and lids normal, oropharynx clear with moist mucosa. Neck: Supple, no elevated JVP or carotid bruits, no thyromegaly. Lungs: Clear to auscultation, nonlabored breathing at rest. Cardiac: Regular rate and rhythm, no S3 or significant systolic murmur, no pericardial rub. Abdomen: Soft, nontender, no hepatomegaly, bowel sounds present, no guarding or rebound. Extremities: No pitting edema, distal pulses 2+. Skin:  Warm and dry. Musculoskeletal: No kyphosis. Neuropsychiatric: Alert and oriented x3, affect grossly appropriate.  ECG:  An ECG dated 10/27/2019 was personally reviewed today and demonstrated:  Normal sinus rhythm, rightward axis.  Recent Labwork: 10/27/2019: ALT 24; AST 17; BUN 33; Creatinine, Ser 0.80; Hemoglobin 15.9; Platelets 250; Potassium 4.4; Sodium 138  February 2021: Hemoglobin A1c 6.5%  Other Studies Reviewed Today:  Lexiscan Myoview 10/24/2017:  T wave inversion was noted during stress in the II, III, aVF, V5 and V6 leads after lexiscan injection. Overall nonspecific finding  The study is normal. There are no perfusion defects consistent with prior infarct or current ischemia.  This is a low risk study.  The left ventricular ejection fraction is normal (55-65%).  Echocardiogram 08/28/2018: 1. The left ventricle has normal systolic function with an ejection  fraction of 60-65%. The cavity  size was normal. There is mildly increased  left ventricular wall thickness. Left ventricular diastolic Doppler  parameters are consistent with  pseudonormalization. No evidence of left ventricular regional wall motion  abnormalities.  2. The right ventricle has mildly reduced systolic function. The cavity  was mildly enlarged. There is no increase in right ventricular wall  thickness. D-shaped interventricular septum suggests RV pressure/volume  overload.  3. No evidence of mitral valve stenosis. Trivial mitral regurgitation.  4. The aortic valve is tricuspid. Moderate calcification of the aortic  valve. Mild stenosis of the aortic valve.  5. The aorta is normal in size and structure.  6. The aortic root is normal in size and structure.  7. The inferior vena cava was dilated in size with <50% respiratory  variability. PA systolic pressure 57 mmHg.   Assessment and Plan:   Medication Adjustments/Labs and Tests Ordered: Current medicines are reviewed at length with the patient  today.  Concerns regarding medicines are outlined above.   Tests Ordered: No orders of the defined types were placed in this encounter.   Medication Changes: No orders of the defined types were placed in this encounter.   Disposition:  Follow up {follow up:15908}  Signed, Satira Sark, MD, [facility] 02/05/2020 8:17 AM    [facility] [place]. [address], [place], [place] [address] Phone: [phone]; Fax: [phone]

## 2020-02-07 LAB — COVID-19, FLU A+B NAA
Influenza A, NAA: NOT DETECTED
Influenza B, NAA: NOT DETECTED
SARS-CoV-2, NAA: NOT DETECTED

## 2020-02-10 DIAGNOSIS — G4733 Obstructive sleep apnea (adult) (pediatric): Secondary | ICD-10-CM | POA: Diagnosis not present

## 2020-02-25 ENCOUNTER — Other Ambulatory Visit (HOSPITAL_COMMUNITY): Payer: Self-pay | Admitting: Internal Medicine

## 2020-02-25 DIAGNOSIS — Z1231 Encounter for screening mammogram for malignant neoplasm of breast: Secondary | ICD-10-CM

## 2020-02-26 LAB — HM DIABETES EYE EXAM

## 2020-02-27 ENCOUNTER — Ambulatory Visit (HOSPITAL_COMMUNITY): Admission: RE | Admit: 2020-02-27 | Payer: BC Managed Care – PPO | Source: Ambulatory Visit

## 2020-02-27 DIAGNOSIS — Z1231 Encounter for screening mammogram for malignant neoplasm of breast: Secondary | ICD-10-CM

## 2020-02-28 ENCOUNTER — Other Ambulatory Visit: Payer: Self-pay | Admitting: Cardiology

## 2020-03-03 ENCOUNTER — Ambulatory Visit (HOSPITAL_COMMUNITY): Payer: BC Managed Care – PPO

## 2020-03-09 NOTE — Progress Notes (Signed)
Cardiology Office Note  Date: 03/10/2020   ID: Morgan, Aguilar 1955/12/04, MRN [id number]  PCP:  Monico Blitz, MD  Cardiologist:  Rozann Lesches, MD Electrophysiologist:  None   Chief Complaint: Cardiac follow-up  History of Present Illness: [name] is a 65 y.o. female with a history of CAD, aortic stenosis, carotid artery disease, COPD, HTN, HLD, morbid obesity, sleep apnea, DM2.  Last encounter with Dr. Domenic Polite 04/17/2019.  She did not report any angina during that visit.  At prior visit she was switched from Lopressor to Toprol.  She remained on aspirin, Lipitor, Lasix, lisinopril.  She was taking Lipitor 10 mg every other day without side effects.  Lab work from PCP office was requested.  No significant change in murmur secondary to mild aortic stenosis.  She was asymptomatic.  She is here for 52-month follow-up.  She denies any recent acute illnesses or hospitalizations.  States she recently had an echocardiogram at her PCP office along with lab work. She denies any recent anginal or exertional symptoms, palpitations or arrhythmias, orthostatic symptoms, CVA or TIA-like symptoms, PND, orthopnea, bleeding, claudication-like symptoms, DVT or PE-like symptoms, or lower extremity edema.  She continues to smoke.  Blood pressure well controlled today.  She is taking all medications as directed.  Past Medical History:  Diagnosis Date  . Anxiety   . Aortic stenosis   . CAD (coronary artery disease)    a. 10/2014: Ant STEMI s/p DES to dLAD  . Carotid disease, bilateral ([place])    a. Duplex 12/2014: mild-mod atherosclerotic plaque without hemodynamically significant stenosis.  [name] COPD (chronic obstructive pulmonary disease) ([place])   . Essential hypertension   . HLD (hyperlipidemia)   . Morbid obesity ([place])   . Myocardial infarction ([place]) 11/17/2014  . Shingles   . Sleep apnea    Uses CPAP  . Type 2 diabetes mellitus ([place])     Past Surgical History:  Procedure Laterality  Date  . ABDOMINAL HYSTERECTOMY    . BREAST BIOPSY Left    2016  . CARDIAC CATHETERIZATION N/A 11/17/2014   Procedure: Left Heart Cath and Coronary Angiography;  Surgeon: Wellington Hampshire, MD;  Location: [facility];  Service: Cardiovascular;  Laterality: N/A;  . CARDIAC CATHETERIZATION N/A 11/17/2014   Procedure: Coronary Stent Intervention;  Surgeon: Wellington Hampshire, MD;  Location: [facility];  Service: Cardiovascular;  Laterality: N/A;  distal lad 2.25x20 promus  . CATARACT EXTRACTION W/PHACO Left 09/20/2016   Procedure: CATARACT EXTRACTION PHACO AND INTRAOCULAR LENS PLACEMENT (IOC);  Surgeon: Tonny Branch, MD;  Location: [facility];  Service: Ophthalmology;  Laterality: Left;  CDE: 9.27  . CATARACT EXTRACTION W/PHACO Right 10/18/2016   Procedure: CATARACT EXTRACTION PHACO AND INTRAOCULAR LENS PLACEMENT (IOC);  Surgeon: Tonny Branch, MD;  Location: [facility];  Service: Ophthalmology;  Laterality: Right;  CDE: 6.38  . CHOLECYSTECTOMY    . SPINAL FUSION     cervical; screws and plates.    Current Outpatient Medications  Medication Sig Dispense Refill  . acetaminophen (TYLENOL) 500 MG tablet Take 500 mg by mouth daily as needed for moderate pain or headache.    . albuterol (PROVENTIL HFA;VENTOLIN HFA) 108 (90 Base) MCG/ACT inhaler Inhale 2 puffs into the lungs every 4 (four) hours as needed for wheezing or shortness of breath. 1 Inhaler 0  . albuterol (PROVENTIL) (2.5 MG/3ML) 0.083% nebulizer solution Take 3 mLs (2.5 mg total) by nebulization every 4 (four) hours as needed for wheezing or  shortness of breath. 75 mL 0  . aspirin EC 81 MG tablet Take 81 mg by mouth daily.    [name] atorvastatin (LIPITOR) 10 MG tablet TAKE 1 TABLET (10 MG TOTAL) BY MOUTH EVERY OTHER DAY. TAKE AT DINNER 45 tablet 3  . cetirizine (ZYRTEC ALLERGY) 10 MG tablet Take 1 tablet (10 mg total) by mouth daily. 30 tablet 0  . dapagliflozin propanediol (FARXIGA) 10 MG TABS tablet Take 10 mg by mouth daily.    [name]  escitalopram (LEXAPRO) 20 MG tablet Take 20 mg by mouth 2 (two) times a day.    . fluticasone (FLONASE) 50 MCG/ACT nasal spray Place 1 spray into both nostrils daily for 14 days. 16 g 0  . furosemide (LASIX) 20 MG tablet TAKE 1 TABLET BY MOUTH EVERY DAY 90 tablet 3  . lisinopril (ZESTRIL) 10 MG tablet TAKE 1 TABLET BY MOUTH EVERY DAY 90 tablet 0  . metFORMIN (GLUCOPHAGE) 1000 MG tablet Take 1,000 mg by mouth 2 (two) times daily.   6  . metoprolol succinate (TOPROL-XL) 25 MG 24 hr tablet TAKE 1 TABLET BY MOUTH EVERY DAY 90 tablet 1  . tizanidine (ZANAFLEX) 2 MG capsule Take 1 capsule (2 mg total) by mouth 3 (three) times daily. 30 capsule 0  . TRESIBA FLEXTOUCH 200 UNIT/ML FlexTouch Pen 40 Units 2 (two) times daily after a meal.    . Vitamin D, Ergocalciferol, (DRISDOL) 50000 units CAPS capsule Take 50,000 Units by mouth every Saturday.  12   No current facility-administered medications for this visit.   Allergies:  Codeine and Sulfa antibiotics   Social History: The patient  reports that she has been smoking cigarettes. She started smoking about 47 years ago. She has a 15.00 pack-year smoking history. She has never used smokeless tobacco. She reports that she does not drink alcohol and does not use drugs.   Family History: The patient's family history includes Cancer in her brother; Heart attack in her father; Heart disease in her father.   ROS:  Please see the history of present illness. Otherwise, complete review of systems is positive for none.  All other systems are reviewed and negative.   Physical Exam: VS:  BP 115/62   Pulse 76   Ht 5\' 4"  (1.626 m)   Wt 218 lb 11 oz (99.2 kg)   SpO2 91%   BMI 37.54 kg/m , BMI Body mass index is 37.54 kg/m.  Wt Readings from Last 3 Encounters:  03/10/20 218 lb 11 oz (99.2 kg)  02/04/20 216 lb (98 kg)  10/27/19 210 lb (95.3 kg)    General: Obese patient appears comfortable at rest. Neck: Supple, no elevated JVP or carotid bruits, no  thyromegaly. Lungs: Clear to auscultation, nonlabored breathing at rest. Cardiac: Regular rate and rhythm, no S3 or significant systolic murmur, no pericardial rub. Extremities: No pitting edema, distal pulses 2+. Skin: Warm and dry. Musculoskeletal: No kyphosis. Neuropsychiatric: Alert and oriented x3, affect grossly appropriate.  ECG:  EKG 10/28/2019, heart rate 65 normal sinus rhythm  Recent Labwork: 10/27/2019: ALT 24; AST 17; BUN 33; Creatinine, Ser 0.80; Hemoglobin 15.9; Platelets 250; Potassium 4.4; Sodium 138     Component Value Date/Time   CHOL 107 (L) 11/07/2015 1037   TRIG 154 (H) 11/07/2015 1037   HDL 38 (L) 11/07/2015 1037   CHOLHDL 2.8 11/07/2015 1037   VLDL 31 (H) 11/07/2015 1037   LDLCALC 38 11/07/2015 1037    Other Studies Reviewed Today:  Echocardiogram 08/28/2018: 1. The left  ventricle has normal systolic function with an ejection  fraction of 60-65%. The cavity size was normal. There is mildly increased  left ventricular wall thickness. Left ventricular diastolic Doppler  parameters are consistent with  pseudonormalization. No evidence of left ventricular regional wall motion  abnormalities.  2. The right ventricle has mildly reduced systolic function. The cavity  was mildly enlarged. There is no increase in right ventricular wall  thickness. D-shaped interventricular septum suggests RV pressure/volume  overload.  3. No evidence of mitral valve stenosis. Trivial mitral regurgitation.  4. The aortic valve is tricuspid. Moderate calcification of the aortic  valve. Mild stenosis of the aortic valve.  5. The aorta is normal in size and structure.  6. The aortic root is normal in size and structure.  7. The inferior vena cava was dilated in size with <50% respiratory  variability. PA systolic pressure 57 mmHg.    Assessment and Plan:  1. CAD in native artery   2. Mixed hyperlipidemia   3. Aortic stenosis, mild   4. Smoking   5. Essential  hypertension    1. CAD in native artery Denies any anginal or exertional symptoms.  Continue aspirin 81 mg daily.   2. Mixed hyperlipidemia Continue atorvastatin 10 mg every other day.  Patient states she has some mild myalgia-like symptoms she is tolerating the medication.  Recent lipids in care everywhere demonstrated LDL 57.  We are requesting recent PCP labs.  3. Aortic stenosis, mild History of mild aortic stenosis on previous echocardiogram 08/28/2018.  Had a recent echocardiogram at her PCP office.  She denies any dyspnea on exertion, anginal symptoms, or near syncopal or syncopal symptoms.  We are requesting a copy of echo from PCP office.  4.  Smoking Continues to smoke.  Highly advised cessation.  Patient states he has cut down significantly on the amount she smokes.  5.  Essential hypertension Blood pressure well controlled on current therapy.  Continue lisinopril 10 mg daily, Lasix 20 mg daily, Toprol-XL 25 mg daily.  Medication Adjustments/Labs and Tests Ordered: Current medicines are reviewed at length with the patient today.  Concerns regarding medicines are outlined above.   Disposition: Follow-up with Dr. Domenic Polite or APP 6 months  Signed, Levell July, NP 03/10/2020 2:56 PM    [facility] [place], [place], [place] [address] Phone: [phone]; Fax: [phone]

## 2020-03-10 ENCOUNTER — Ambulatory Visit (INDEPENDENT_AMBULATORY_CARE_PROVIDER_SITE_OTHER): Payer: BC Managed Care – PPO | Admitting: Family Medicine

## 2020-03-10 ENCOUNTER — Encounter: Payer: Self-pay | Admitting: Family Medicine

## 2020-03-10 VITALS — BP 115/62 | HR 76 | Ht 64.0 in | Wt 218.7 lb

## 2020-03-10 DIAGNOSIS — F17200 Nicotine dependence, unspecified, uncomplicated: Secondary | ICD-10-CM

## 2020-03-10 DIAGNOSIS — E782 Mixed hyperlipidemia: Secondary | ICD-10-CM | POA: Diagnosis not present

## 2020-03-10 DIAGNOSIS — I2510 Atherosclerotic heart disease of native coronary artery without angina pectoris: Secondary | ICD-10-CM | POA: Diagnosis not present

## 2020-03-10 DIAGNOSIS — I350 Nonrheumatic aortic (valve) stenosis: Secondary | ICD-10-CM

## 2020-03-10 DIAGNOSIS — I10 Essential (primary) hypertension: Secondary | ICD-10-CM

## 2020-03-10 NOTE — Patient Instructions (Signed)
Medication Instructions:  Continue all current medications.   Labwork: none  Testing/Procedures: none  Follow-Up: 6 months   Any Other Special Instructions Will Be Listed Below (If Applicable).   If you need a refill on your cardiac medications before your next appointment, please call your pharmacy.  

## 2020-03-11 ENCOUNTER — Encounter: Payer: Self-pay | Admitting: *Deleted

## 2020-03-17 ENCOUNTER — Telehealth: Payer: Self-pay | Admitting: *Deleted

## 2020-03-17 ENCOUNTER — Ambulatory Visit (HOSPITAL_COMMUNITY)
Admission: RE | Admit: 2020-03-17 | Discharge: 2020-03-17 | Disposition: A | Discharge status: Home / Self Care | Payer: BC Managed Care – PPO | Source: Ambulatory Visit | Attending: Internal Medicine | Admitting: Internal Medicine

## 2020-03-17 ENCOUNTER — Other Ambulatory Visit: Payer: Self-pay

## 2020-03-17 DIAGNOSIS — Z1231 Encounter for screening mammogram for malignant neoplasm of breast: Secondary | ICD-10-CM

## 2020-03-17 NOTE — Telephone Encounter (Signed)
LABS -  Verta Ellen., NP  03/12/2020 11:39 AM EST      Lab work from Dr Trena Platt office looked good except for A1c elevated at 7.2%. Glucose was 137. She is likely already aware of this and was informed by Dr Manuella Ghazi but you may want to call her and make sure she knows.   ECHO -   Verta Ellen., NP  03/12/2020 11:34 AM EST      Please let the patient know the echocardiogram showed she has good pumping function of the heart. According to the conclusion of the report she has some mild leaking and narrowing of the aortic valve and it was recommended she have another echocardiogam in 3 years to monitor

## 2020-03-17 NOTE — Telephone Encounter (Signed)
Laurine Blazer, LPN  6/68/1594 7:07 PM EST Back to Top     LM x 2   Laurine Blazer, LPN  07/09/1832 37:35 PM EST      Left message to return call.

## 2020-03-18 LAB — HM MAMMOGRAPHY

## 2020-03-20 ENCOUNTER — Encounter: Payer: Self-pay | Admitting: *Deleted

## 2020-03-20 NOTE — Telephone Encounter (Signed)
Notified via mychart.

## 2020-03-24 ENCOUNTER — Ambulatory Visit
Admission: RE | Admit: 2020-03-24 | Discharge: 2020-03-24 | Disposition: A | Discharge status: Home / Self Care | Payer: BC Managed Care – PPO | Source: Ambulatory Visit | Attending: Family Medicine | Admitting: Family Medicine

## 2020-03-24 ENCOUNTER — Other Ambulatory Visit: Payer: Self-pay

## 2020-03-24 ENCOUNTER — Ambulatory Visit (INDEPENDENT_AMBULATORY_CARE_PROVIDER_SITE_OTHER): Payer: BC Managed Care – PPO

## 2020-03-24 VITALS — BP 154/82 | HR 67 | Temp 98.4°F | Resp 18

## 2020-03-24 DIAGNOSIS — W19XXXA Unspecified fall, initial encounter: Secondary | ICD-10-CM

## 2020-03-24 DIAGNOSIS — M25532 Pain in left wrist: Secondary | ICD-10-CM

## 2020-03-24 DIAGNOSIS — M25432 Effusion, left wrist: Secondary | ICD-10-CM

## 2020-03-24 DIAGNOSIS — G5602 Carpal tunnel syndrome, left upper limb: Secondary | ICD-10-CM | POA: Diagnosis not present

## 2020-03-24 MED ORDER — DEXAMETHASONE SODIUM PHOSPHATE 10 MG/ML IJ SOLN
10.0000 mg | Freq: Once | INTRAMUSCULAR | Status: AC
  Administered 2020-03-24: 10 mg via INTRAMUSCULAR

## 2020-03-24 MED ORDER — DICLOFENAC SODIUM 25 MG PO TBEC: 25.0000 mg | DELAYED_RELEASE_TABLET | Freq: Two times a day (BID) | ORAL | 0 refills | Status: DC

## 2020-03-24 NOTE — ED Triage Notes (Addendum)
LT wrist pain and swelling that radiates up her forearm that started Friday.  Denies any injury.

## 2020-03-24 NOTE — Discharge Instructions (Addendum)
Wear spica splint at bedtime nightly until pain completely resolves. If pain persist greater than 7 days or worsens at anytime, follow-up with [facility].  Take Voltaren 25 mg twice daily as needed for wrist pain Continue Tizanidine at bedtime for wrist pain at rest.

## 2020-03-24 NOTE — ED Provider Notes (Signed)
[facility]    CSN: [id number] Arrival date & time: 03/24/20  1142      History   Chief Complaint Chief Complaint  Patient presents with  . Wrist Pain    HPI AYDE RECORD is a 65 y.o. female.   HPI  Patient with a history of carpal tunnel presnets with left wrist pain presents today with gradual worsening radiating, sharp left mid dorsum wrist pain radiating upwardly into the forearm. No injury.  Pain worsens at night. Taken OTC medication without relief. Past Medical History:  Diagnosis Date  . Anxiety   . Aortic stenosis   . CAD (coronary artery disease)    a. 10/2014: Ant STEMI s/p DES to dLAD  . Carotid disease, bilateral ([place])    a. Duplex 12/2014: mild-mod atherosclerotic plaque without hemodynamically significant stenosis.  [name] COPD (chronic obstructive pulmonary disease) ([place])   . Essential hypertension   . HLD (hyperlipidemia)   . Morbid obesity ([place])   . Myocardial infarction ([place]) 11/17/2014  . Shingles   . Sleep apnea    Uses CPAP  . Type 2 diabetes mellitus [facility])     Patient Active Problem List   Diagnosis Date Noted  . Acute diastolic CHF (congestive heart failure) ([place]) 08/27/2018  . Acute respiratory failure with hypoxia ([place]) 08/27/2018  . Pleural effusion due to CHF (congestive heart failure) ([place]) 08/27/2018  . Hypokalemia 08/27/2018  . Abdominal wall cellulitis 08/25/2018  . Anasarca 08/25/2018  . Hypoxia 08/25/2018  . Backache 08/25/2018  . Anxiety 08/25/2018  . Morbid obesity ([place]) 01/14/2015  . CAD (coronary artery disease)   . COPD (chronic obstructive pulmonary disease) ([place])   . Hypertension   . DM type 2 causing vascular disease ([place])   . Current smoker   . HLD (hyperlipidemia)   . Aortic stenosis   . ST elevation (STEMI) myocardial infarction involving left anterior descending coronary artery ([place]) 11/17/2014    Past Surgical History:  Procedure Laterality Date  . ABDOMINAL HYSTERECTOMY    . BREAST BIOPSY Left     2016  . CARDIAC CATHETERIZATION N/A 11/17/2014   Procedure: Left Heart Cath and Coronary Angiography;  Surgeon: Wellington Hampshire, MD;  Location: [facility];  Service: Cardiovascular;  Laterality: N/A;  . CARDIAC CATHETERIZATION N/A 11/17/2014   Procedure: Coronary Stent Intervention;  Surgeon: Wellington Hampshire, MD;  Location: [facility];  Service: Cardiovascular;  Laterality: N/A;  distal lad 2.25x20 promus  . CATARACT EXTRACTION W/PHACO Left 09/20/2016   Procedure: CATARACT EXTRACTION PHACO AND INTRAOCULAR LENS PLACEMENT (IOC);  Surgeon: Tonny Branch, MD;  Location[facility];  Service: Ophthalmology;  Laterality: Left;  CDE: 9.27  . CATARACT EXTRACTION W/PHACO Right 10/18/2016   Procedure: CATARACT EXTRACTION PHACO AND INTRAOCULAR LENS PLACEMENT (IOC);  Surgeon: Tonny Branch, MD;  Location: [facility];  Service: Ophthalmology;  Laterality: Right;  CDE: 6.38  . CHOLECYSTECTOMY    . SPINAL FUSION     cervical; screws and plates.    OB History    Gravida  1   Para  1   Term  1   Preterm      AB      Living  1     SAB      IAB      Ectopic      Multiple      Live Births               Home Medications  Prior to Admission medications   Medication Sig Start Date End Date Taking? Authorizing Provider  acetaminophen (TYLENOL) 500 MG tablet Take 500 mg by mouth daily as needed for moderate pain or headache.    [provider]  albuterol (PROVENTIL HFA;VENTOLIN HFA) 108 (90 Base) MCG/ACT inhaler Inhale 2 puffs into the lungs every 4 (four) hours as needed for wheezing or shortness of breath. 05/17/15   [place]ine Graven, DO  albuterol (PROVENTIL) (2.5 MG/3ML) 0.083% nebulizer solution Take 3 mLs (2.5 mg total) by nebulization every 4 (four) hours as needed for wheezing or shortness of breath. 05/17/15   [place]ine Graven, DO  aspirin EC 81 MG tablet Take 81 mg by mouth daily.    [provider]  atorvastatin (LIPITOR) 10 MG tablet TAKE 1 TABLET (10  MG TOTAL) BY MOUTH EVERY OTHER DAY. TAKE AT [facility] 09/25/18 04/17/19  Satira Sark, MD  cetirizine (ZYRTEC ALLERGY) 10 MG tablet Take 1 tablet (10 mg total) by mouth daily. 02/04/20   Avegno, Darrelyn Hillock, FNP  dapagliflozin propanediol (FARXIGA) 10 MG TABS tablet Take 10 mg by mouth daily.    [provider]  escitalopram (LEXAPRO) 20 MG tablet Take 20 mg by mouth 2 (two) times a day.    [provider]  fluticasone (FLONASE) 50 MCG/ACT nasal spray Place 1 spray into both nostrils daily for 14 days. 02/04/20 02/18/20  Emerson Monte, FNP  furosemide (LASIX) 20 MG tablet TAKE 1 TABLET BY MOUTH EVERY DAY 06/26/19   Satira Sark, MD  lisinopril (ZESTRIL) 10 MG tablet TAKE 1 TABLET BY MOUTH EVERY DAY 02/28/20   Satira Sark, MD  metFORMIN (GLUCOPHAGE) 1000 MG tablet Take 1,000 mg by mouth 2 (two) times daily.  09/26/14   [provider]  metoprolol succinate (TOPROL-XL) 25 MG 24 hr tablet TAKE 1 TABLET BY MOUTH EVERY DAY 09/24/19   Ahmed Prima, [place] M, PA-C  tizanidine (ZANAFLEX) 2 MG capsule Take 1 capsule (2 mg total) by mouth 3 (three) times daily. 10/22/19   [place]urst, [place], PA-C  TRESIBA FLEXTOUCH 200 UNIT/ML FlexTouch Pen 40 Units 2 (two) times daily after a meal. 09/28/19   [provider]  Vitamin D, Ergocalciferol, (DRISDOL) 50000 units CAPS capsule Take 50,000 Units by mouth every Saturday. 08/30/16   [provider]    Family History Family History  Problem Relation Age of Onset  . Heart attack Father   . Heart disease Father   . Cancer Brother     Social History Social History   Tobacco Use  . Smoking status: Current Some Day Smoker    Packs/day: 0.50    Years: 30.00    Pack years: 15.00    Types: Cigarettes    Start date: 12/26/1972  . Smokeless tobacco: Never Used  . Tobacco comment: 3 cigs per day  Vaping Use  . Vaping Use: Never used  Substance Use Topics  . Alcohol use: No    Alcohol/week: 0.0 standard drinks  . Drug  use: No     Allergies   Codeine and Sulfa antibiotics   Review of Systems Review of Systems Pertinent negatives listed in HPI  Physical Exam Triage Vital Signs ED Triage Vitals  Enc Vitals Group     BP 03/24/20 1204 (!) 154/82     Pulse Rate 03/24/20 1204 67     Resp 03/24/20 1204 18     Temp 03/24/20 1204 98.4 F (36.9 C)     Temp Source 03/24/20 1204 Oral  SpO2 03/24/20 1204 93 %     Weight --      Height --      Head Circumference --      Peak Flow --      Pain Score 03/24/20 1202 7     Pain Loc --      Pain Edu? --      Excl. in [place]? --    No data found.  Updated Vital Signs BP (!) 154/82 (BP Location: Right Arm)   Pulse 67   Temp 98.4 F (36.9 C) (Oral)   Resp 18   SpO2 93%   Visual Acuity Right Eye Distance:   Left Eye Distance:   Bilateral Distance:    Right Eye Near:   Left Eye Near:    Bilateral Near:     Physical Exam General appearance: alert, cooperative, no distress Head: Normocephalic, without obvious abnormality, atraumatic Respiratory: Respirations even and unlabored, normal respiratory rate Heart: rate and rhythm normal. No gallop or murmurs noted on exam  Abdomen: BS +, no distention, no rebound tenderness, or no mass Extremities: Left wrist: no gross deformities, swelling, or ecchymosis  Skin: Skin color, texture, turgor normal. No rashes seen  Psych: Appropriate mood and affect. Neurologic: GCS 15, normal coordination, normal gait, normal strength   UC Treatments / Results  Labs (all labs ordered are listed, but only abnormal results are displayed) Labs Reviewed - No data to display  EKG   Radiology DG Wrist Complete Left  Result Date: 03/24/2020 CLINICAL DATA:  Left wrist pain and swelling since the patient suffered a fall 03/21/2020. Initial encounter. EXAM: LEFT WRIST - COMPLETE 3+ VIEW COMPARISON:  None. FINDINGS: There is no evidence of fracture or dislocation. There is no evidence of arthropathy or other focal bone  abnormality. Soft tissues are unremarkable. IMPRESSION: Negative exam. Electronically Signed   By: Inge Rise M.D.   On: 03/24/2020 12:24    Procedures Procedures (including critical care time)  Medications Ordered in UC Medications  dexamethasone (DECADRON) injection 10 mg (10 mg Intramuscular Given 03/24/20 1237)    Initial Impression / Assessment and Plan / UC Course  I have reviewed the triage vital signs and the nursing notes.  Pertinent labs & imaging results that were available during my care of the patient were reviewed by me and considered in my medical decision making (see chart for details).    Left wrist imaging negative for acute fracture or deformity. Suspect carpal tunnel syndrome. Thumb spica wear during day as needed and wear every night while at rest. Voltaren to reduce inflammation Decadron IM given in clinic today Orthopedic follow-up if symptoms worsen or do not improve. Final Clinical Impressions(s) / UC Diagnoses   Final diagnoses:  Carpal tunnel syndrome of left wrist     Discharge Instructions     Wear spica splint at bedtime nightly until pain completely resolves. If pain persist greater than 7 days or worsens at anytime, follow-up with [facility].  Take Voltaren 25 mg twice daily as needed for wrist pain Continue Tizanidine at bedtime for wrist pain at rest.    ED Prescriptions    Medication Sig Dispense Auth. Provider   diclofenac (VOLTAREN) 25 MG EC tablet Take 1 tablet (25 mg total) by mouth 2 (two) times daily. 30 tablet Scot Jun, FNP     PDMP not reviewed this encounter.   Scot Jun, [place] 03/27/20 [phone]

## 2020-04-05 ENCOUNTER — Other Ambulatory Visit: Payer: Self-pay | Admitting: Student

## 2020-04-18 DIAGNOSIS — G4733 Obstructive sleep apnea (adult) (pediatric): Secondary | ICD-10-CM | POA: Diagnosis not present

## 2020-05-13 DIAGNOSIS — I10 Essential (primary) hypertension: Secondary | ICD-10-CM | POA: Diagnosis not present

## 2020-05-13 DIAGNOSIS — M654 Radial styloid tenosynovitis [de Quervain]: Secondary | ICD-10-CM | POA: Diagnosis not present

## 2020-05-13 DIAGNOSIS — M25532 Pain in left wrist: Secondary | ICD-10-CM | POA: Diagnosis not present

## 2020-05-13 DIAGNOSIS — E1165 Type 2 diabetes mellitus with hyperglycemia: Secondary | ICD-10-CM | POA: Diagnosis not present

## 2020-05-13 DIAGNOSIS — Z299 Encounter for prophylactic measures, unspecified: Secondary | ICD-10-CM | POA: Diagnosis not present

## 2020-05-19 DIAGNOSIS — M180 Bilateral primary osteoarthritis of first carpometacarpal joints: Secondary | ICD-10-CM | POA: Diagnosis not present

## 2020-05-19 DIAGNOSIS — M542 Cervicalgia: Secondary | ICD-10-CM | POA: Diagnosis not present

## 2020-05-19 DIAGNOSIS — T732XXA Exhaustion due to exposure, initial encounter: Secondary | ICD-10-CM | POA: Diagnosis not present

## 2020-05-19 DIAGNOSIS — M654 Radial styloid tenosynovitis [de Quervain]: Secondary | ICD-10-CM | POA: Diagnosis not present

## 2020-05-19 DIAGNOSIS — M659 Synovitis and tenosynovitis, unspecified: Secondary | ICD-10-CM | POA: Diagnosis not present

## 2020-05-21 ENCOUNTER — Other Ambulatory Visit: Payer: Self-pay | Admitting: Cardiology

## 2020-05-21 DIAGNOSIS — M2550 Pain in unspecified joint: Secondary | ICD-10-CM | POA: Diagnosis not present

## 2020-05-21 DIAGNOSIS — E559 Vitamin D deficiency, unspecified: Secondary | ICD-10-CM | POA: Diagnosis not present

## 2020-05-21 DIAGNOSIS — R5383 Other fatigue: Secondary | ICD-10-CM | POA: Diagnosis not present

## 2020-06-05 DIAGNOSIS — E083293 Diabetes mellitus due to underlying condition with mild nonproliferative diabetic retinopathy without macular edema, bilateral: Secondary | ICD-10-CM | POA: Diagnosis not present

## 2020-06-17 DIAGNOSIS — Z789 Other specified health status: Secondary | ICD-10-CM | POA: Diagnosis not present

## 2020-06-17 DIAGNOSIS — K2900 Acute gastritis without bleeding: Secondary | ICD-10-CM | POA: Diagnosis not present

## 2020-06-17 DIAGNOSIS — F17210 Nicotine dependence, cigarettes, uncomplicated: Secondary | ICD-10-CM | POA: Diagnosis not present

## 2020-06-17 DIAGNOSIS — F419 Anxiety disorder, unspecified: Secondary | ICD-10-CM | POA: Diagnosis not present

## 2020-06-17 DIAGNOSIS — I10 Essential (primary) hypertension: Secondary | ICD-10-CM | POA: Diagnosis not present

## 2020-06-17 DIAGNOSIS — E1151 Type 2 diabetes mellitus with diabetic peripheral angiopathy without gangrene: Secondary | ICD-10-CM | POA: Diagnosis not present

## 2020-06-17 DIAGNOSIS — Z299 Encounter for prophylactic measures, unspecified: Secondary | ICD-10-CM | POA: Diagnosis not present

## 2020-06-20 ENCOUNTER — Other Ambulatory Visit: Payer: Self-pay | Admitting: Cardiology

## 2020-07-16 ENCOUNTER — Emergency Department (HOSPITAL_COMMUNITY): Payer: BC Managed Care – PPO

## 2020-07-16 ENCOUNTER — Encounter (HOSPITAL_COMMUNITY): Payer: Self-pay | Admitting: Emergency Medicine

## 2020-07-16 ENCOUNTER — Telehealth: Payer: Self-pay

## 2020-07-16 ENCOUNTER — Emergency Department (HOSPITAL_COMMUNITY)
Admission: EM | Admit: 2020-07-16 | Discharge: 2020-07-16 | Disposition: A | Discharge status: Home / Self Care | Payer: BC Managed Care – PPO | Attending: Emergency Medicine | Admitting: Emergency Medicine

## 2020-07-16 DIAGNOSIS — Z7982 Long term (current) use of aspirin: Secondary | ICD-10-CM | POA: Diagnosis not present

## 2020-07-16 DIAGNOSIS — I959 Hypotension, unspecified: Secondary | ICD-10-CM | POA: Diagnosis not present

## 2020-07-16 DIAGNOSIS — I110 Hypertensive heart disease with heart failure: Secondary | ICD-10-CM | POA: Diagnosis not present

## 2020-07-16 DIAGNOSIS — I2510 Atherosclerotic heart disease of native coronary artery without angina pectoris: Secondary | ICD-10-CM | POA: Diagnosis not present

## 2020-07-16 DIAGNOSIS — R0789 Other chest pain: Secondary | ICD-10-CM | POA: Insufficient documentation

## 2020-07-16 DIAGNOSIS — Z7984 Long term (current) use of oral hypoglycemic drugs: Secondary | ICD-10-CM | POA: Insufficient documentation

## 2020-07-16 DIAGNOSIS — R079 Chest pain, unspecified: Secondary | ICD-10-CM

## 2020-07-16 DIAGNOSIS — Z79899 Other long term (current) drug therapy: Secondary | ICD-10-CM | POA: Diagnosis not present

## 2020-07-16 DIAGNOSIS — R0602 Shortness of breath: Secondary | ICD-10-CM | POA: Diagnosis not present

## 2020-07-16 DIAGNOSIS — F17210 Nicotine dependence, cigarettes, uncomplicated: Secondary | ICD-10-CM | POA: Insufficient documentation

## 2020-07-16 DIAGNOSIS — J811 Chronic pulmonary edema: Secondary | ICD-10-CM | POA: Diagnosis not present

## 2020-07-16 DIAGNOSIS — J449 Chronic obstructive pulmonary disease, unspecified: Secondary | ICD-10-CM | POA: Diagnosis not present

## 2020-07-16 DIAGNOSIS — I5031 Acute diastolic (congestive) heart failure: Secondary | ICD-10-CM | POA: Insufficient documentation

## 2020-07-16 DIAGNOSIS — E119 Type 2 diabetes mellitus without complications: Secondary | ICD-10-CM | POA: Diagnosis not present

## 2020-07-16 DIAGNOSIS — R0689 Other abnormalities of breathing: Secondary | ICD-10-CM | POA: Diagnosis not present

## 2020-07-16 LAB — HEPATIC FUNCTION PANEL
ALT: 21 U/L (ref 0–44)
AST: 19 U/L (ref 15–41)
Albumin: 3.4 g/dL — ABNORMAL LOW (ref 3.5–5.0)
Alkaline Phosphatase: 56 U/L (ref 38–126)
Bilirubin, Direct: 0.1 mg/dL (ref 0.0–0.2)
Indirect Bilirubin: 0.4 mg/dL (ref 0.3–0.9)
Total Bilirubin: 0.5 mg/dL (ref 0.3–1.2)
Total Protein: 6.3 g/dL — ABNORMAL LOW (ref 6.5–8.1)

## 2020-07-16 LAB — D-DIMER, QUANTITATIVE: D-Dimer, Quant: 0.36 ug/mL-FEU (ref 0.00–0.50)

## 2020-07-16 LAB — BASIC METABOLIC PANEL
Anion gap: 10 (ref 5–15)
BUN: 21 mg/dL (ref 8–23)
CO2: 26 mmol/L (ref 22–32)
Calcium: 8.9 mg/dL (ref 8.9–10.3)
Chloride: 104 mmol/L (ref 98–111)
Creatinine, Ser: 0.74 mg/dL (ref 0.44–1.00)
GFR, Estimated: >60 mL/min (ref >60)
Glucose, Bld: 187 mg/dL — ABNORMAL HIGH (ref 70–99)
Potassium: 3.8 mmol/L (ref 3.5–5.1)
Sodium: 140 mmol/L (ref 135–145)

## 2020-07-16 LAB — TROPONIN I (HIGH SENSITIVITY)
Troponin I (High Sensitivity): 5 ng/L (ref <18)
Troponin I (High Sensitivity): 6 ng/L (ref <18)

## 2020-07-16 LAB — BRAIN NATRIURETIC PEPTIDE: B Natriuretic Peptide: 182 pg/mL — ABNORMAL HIGH (ref 0.0–100.0)

## 2020-07-16 LAB — CBC
HCT: 44.7 % (ref 36.0–46.0)
Hemoglobin: 14 g/dL (ref 12.0–15.0)
MCH: 30.1 pg (ref 26.0–34.0)
MCHC: 31.3 g/dL (ref 30.0–36.0)
MCV: 96.1 fL (ref 80.0–100.0)
Platelets: 268 10*3/uL (ref 150–400)
RBC: 4.65 MIL/uL (ref 3.87–5.11)
RDW: 15.7 % — ABNORMAL HIGH (ref 11.5–15.5)
WBC: 7.9 10*3/uL (ref 4.0–10.5)
nRBC: 0 % (ref 0.0–0.2)

## 2020-07-16 MED ORDER — ISOSORBIDE MONONITRATE ER 30 MG PO TB24: 30.0000 mg | ORAL_TABLET | Freq: Every day | ORAL | 1 refills | Status: DC

## 2020-07-16 MED ORDER — ONDANSETRON HCL 4 MG/2ML IJ SOLN
4.0000 mg | Freq: Once | INTRAMUSCULAR | Status: AC
  Administered 2020-07-16: 09:00:00 4 mg via INTRAVENOUS
  Filled 2020-07-16: qty 2

## 2020-07-16 MED ORDER — SODIUM CHLORIDE 0.9 % IV BOLUS
500.0000 mL | Freq: Once | INTRAVENOUS | Status: AC
  Administered 2020-07-16: 09:00:00 500 mL via INTRAVENOUS

## 2020-07-16 MED ORDER — HYDROMORPHONE HCL 1 MG/ML IJ SOLN
1.0000 mg | Freq: Once | INTRAMUSCULAR | Status: AC
  Administered 2020-07-16: 09:00:00 1 mg via INTRAVENOUS
  Filled 2020-07-16: qty 1

## 2020-07-16 MED ORDER — ISOSORBIDE MONONITRATE ER 30 MG PO TB24
30.0000 mg | ORAL_TABLET | Freq: Once | ORAL | Status: AC
  Administered 2020-07-16: 15:00:00 30 mg via ORAL
  Filled 2020-07-16: qty 1

## 2020-07-16 NOTE — ED Provider Notes (Signed)
[facility] EMERGENCY DEPARTMENT Provider Note   CSN: [id number] Arrival date & time: 07/16/20  8563     History Chief Complaint  Patient presents with   Chest Pain    [name] is a 65 y.o. female.  Patient states that around 5 AM she woke up with left-sided chest pain.  She was given eventually 2 nitroglycerin and by the time she came to the emergency department she was only having minimal discomfort which has disappeared shortly afterwards.  Patient has a history of coronary artery disease with an MI  The history is provided by the patient and medical records. No language interpreter was used.  Chest Pain Pain location:  L chest Pain quality: aching   Pain radiates to:  Does not radiate Pain severity:  Moderate Onset quality:  Sudden Duration: 3 hours. Chronicity:  New Context: not breathing   Relieved by:  Nothing Worsened by:  Nothing Ineffective treatments:  None tried Associated symptoms: no abdominal pain, no back pain, no cough, no fatigue and no headache   Risk factors: no aortic disease       Past Medical History:  Diagnosis Date   Anxiety    Aortic stenosis    CAD (coronary artery disease)    a. 10/2014: Ant STEMI s/p DES to dLAD   Carotid disease, bilateral ([place])    a. Duplex 12/2014: mild-mod atherosclerotic plaque without hemodynamically significant stenosis.   COPD (chronic obstructive pulmonary disease) (HCC)    Essential hypertension    HLD (hyperlipidemia)    Morbid obesity (HCC)    Myocardial infarction ([place]) 11/17/2014   Shingles    Sleep apnea    Uses CPAP   Type 2 diabetes mellitus [facility])     Patient Active Problem List   Diagnosis Date Noted   Acute diastolic CHF (congestive heart failure) ([place]) 08/27/2018   Acute respiratory failure with hypoxia (HCC) 08/27/2018   Pleural effusion due to CHF (congestive heart failure) ([place]) 08/27/2018   Hypokalemia 08/27/2018   Abdominal wall cellulitis 08/25/2018   Anasarca 08/25/2018    Hypoxia 08/25/2018   Backache 08/25/2018   Anxiety 08/25/2018   Morbid obesity ([place]) 01/14/2015   CAD (coronary artery disease)    COPD (chronic obstructive pulmonary disease) (HCC)    Hypertension    DM type 2 causing vascular disease (HCC)    Current smoker    HLD (hyperlipidemia)    Aortic stenosis    ST elevation (STEMI) myocardial infarction involving left anterior descending coronary artery ([place]) 11/17/2014    Past Surgical History:  Procedure Laterality Date   ABDOMINAL HYSTERECTOMY     BREAST BIOPSY Left    2016   CARDIAC CATHETERIZATION N/A 11/17/2014   Procedure: Left Heart Cath and Coronary Angiography;  Surgeon: Wellington Hampshire, MD;  Location: [place] CV LAB;  Service: Cardiovascular;  Laterality: N/A;   CARDIAC CATHETERIZATION N/A 11/17/2014   Procedure: Coronary Stent Intervention;  Surgeon: Wellington Hampshire, MD;  Location: [place] CV LAB;  Service: Cardiovascular;  Laterality: N/A;  distal lad 2.25x20 promus   CATARACT EXTRACTION W/PHACO Left 09/20/2016   Procedure: CATARACT EXTRACTION PHACO AND INTRAOCULAR LENS PLACEMENT (IOC);  Surgeon: Tonny Branch, MD;  Location: [facility];  Service: Ophthalmology;  Laterality: Left;  CDE: 9.27   CATARACT EXTRACTION W/PHACO Right 10/18/2016   Procedure: CATARACT EXTRACTION PHACO AND INTRAOCULAR LENS PLACEMENT (IOC);  Surgeon: Tonny Branch, MD;  Location: [facility] OR[facility];  Service: Ophthalmology;  Laterality: Right;  CDE: 6.38   CHOLECYSTECTOMY  SPINAL FUSION     cervical; screws and plates.     OB History     Gravida  1   Para  1   Term  1   Preterm      AB      Living  1      SAB      IAB      Ectopic      Multiple      Live Births              Family History  Problem Relation Age of Onset   Heart attack Father    Heart disease Father    Cancer Brother     Social History   Tobacco Use   Smoking status: Some Days    Packs/day: 0.50    Years: 30.00    Pack years: 15.00    Types: Cigarettes     Start date: 12/26/1972   Smokeless tobacco: Never   Tobacco comments:    3 cigs per day  Vaping Use   Vaping Use: Never used  Substance Use Topics   Alcohol use: No    Alcohol/week: 0.0 standard drinks   Drug use: No    Home Medications Prior to Admission medications   Medication Sig Start Date End Date Taking? Authorizing Provider  acetaminophen (TYLENOL) 500 MG tablet Take 500 mg by mouth daily as needed for moderate pain or headache.   Yes [provider]  albuterol (PROVENTIL HFA;VENTOLIN HFA) 108 (90 Base) MCG/ACT inhaler Inhale 2 puffs into the lungs every 4 (four) hours as needed for wheezing or shortness of breath. 05/17/15  Yes Francine Graven, DO  aspirin EC 81 MG tablet Take 81 mg by mouth daily.   Yes [provider]  atorvastatin (LIPITOR) 10 MG tablet TAKE 1 TABLET (10 MG TOTAL) BY MOUTH EVERY OTHER DAY. TAKE AT [facility] 09/25/18 07/16/20 Yes Satira Sark, MD  dapagliflozin propanediol (FARXIGA) 10 MG TABS tablet Take 10 mg by mouth daily.   Yes [provider]  escitalopram (LEXAPRO) 20 MG tablet Take 20 mg by mouth 2 (two) times a day.   Yes [provider]  fluticasone (FLONASE) 50 MCG/ACT nasal spray Place 1 spray into both nostrils daily for 14 days. 02/04/20 07/16/20 Yes Avegno, Darrelyn Hillock, FNP  furosemide (LASIX) 20 MG tablet TAKE 1 TABLET BY MOUTH EVERY DAY Patient taking differently: Take 20 mg by mouth daily. 06/24/20  Yes Satira Sark, MD  isosorbide mononitrate (IMDUR) 30 MG 24 hr tablet Take 1 tablet (30 mg total) by mouth daily. 07/16/20  Yes Milton Ferguson, MD  lisinopril (ZESTRIL) 10 MG tablet TAKE 1 TABLET BY MOUTH EVERY DAY Patient taking differently: Take 10 mg by mouth daily. 05/21/20  Yes Satira Sark, MD  metFORMIN (GLUCOPHAGE) 1000 MG tablet Take 1,000 mg by mouth 2 (two) times daily.  09/26/14  Yes [provider]  metoprolol succinate (TOPROL-XL) 25 MG 24 hr tablet TAKE 1 TABLET BY MOUTH EVERY  DAY Patient taking differently: Take 25 mg by mouth daily. 04/07/20  Yes Strader, [place] M, PA-C  TRESIBA FLEXTOUCH 200 UNIT/ML FlexTouch Pen Inject 40 Units into the skin 2 (two) times daily after a meal. 09/28/19  Yes [provider]  albuterol (PROVENTIL) (2.5 MG/3ML) 0.083% nebulizer solution Take 3 mLs (2.5 mg total) by nebulization every 4 (four) hours as needed for wheezing or shortness of breath. 05/17/15   Francine Graven, DO  cetirizine (ZYRTEC ALLERGY) 10  MG tablet Take 1 tablet (10 mg total) by mouth daily. Patient not taking: No sig reported 02/04/20   Emerson Monte, FNP  diclofenac (VOLTAREN) 25 MG EC tablet Take 1 tablet (25 mg total) by mouth 2 (two) times daily. Patient not taking: No sig reported 03/24/20   Scot Jun, FNP  tizanidine (ZANAFLEX) 2 MG capsule Take 1 capsule (2 mg total) by mouth 3 (three) times daily. Patient not taking: No sig reported 10/22/19   Stacey Drain [place], PA-C    Allergies    Codeine and Sulfa antibiotics  Review of Systems   Review of Systems  Constitutional:  Negative for appetite change and fatigue.  HENT:  Negative for congestion, ear discharge and sinus pressure.   Eyes:  Negative for discharge.  Respiratory:  Negative for cough.   Cardiovascular:  Positive for chest pain.  Gastrointestinal:  Negative for abdominal pain and diarrhea.  Genitourinary:  Negative for frequency and hematuria.  Musculoskeletal:  Negative for back pain.  Skin:  Negative for rash.  Neurological:  Negative for seizures and headaches.  Psychiatric/Behavioral:  Negative for hallucinations.    Physical Exam Updated Vital Signs BP (!) 131/56   Pulse (!) 58   Temp 97.9 F (36.6 C) (Oral)   Resp 17   Ht 5\' 4"  (1.626 m)   Wt 100.2 kg   SpO2 100%   BMI 37.93 kg/m   Physical Exam Vitals and nursing note reviewed.  Constitutional:      Appearance: She is well-developed.  HENT:     Head: Normocephalic.     Nose: Nose normal.  Eyes:      General: No scleral icterus.    Conjunctiva/sclera: Conjunctivae normal.  Neck:     Thyroid: No thyromegaly.  Cardiovascular:     Rate and Rhythm: Normal rate and regular rhythm.     Heart sounds: No murmur heard.   No friction rub. No gallop.  Pulmonary:     Breath sounds: No stridor. No wheezing or rales.  Chest:     Chest wall: No tenderness.  Abdominal:     General: There is no distension.     Tenderness: There is no abdominal tenderness. There is no rebound.  Musculoskeletal:        General: Normal range of motion.     Cervical back: Neck supple.  Lymphadenopathy:     Cervical: No cervical adenopathy.  Skin:    Findings: No erythema or rash.  Neurological:     Mental Status: She is alert and oriented to person, place, and time.     Motor: No abnormal muscle tone.     Coordination: Coordination normal.  Psychiatric:        Behavior: Behavior normal.    ED Results / Procedures / Treatments   Labs (all labs ordered are listed, but only abnormal results are displayed) Labs Reviewed  BASIC METABOLIC PANEL - Abnormal; Notable for the following components:      Result Value   Glucose, Bld 187 (*)    All other components within normal limits  CBC - Abnormal; Notable for the following components:   RDW 15.7 (*)    All other components within normal limits  HEPATIC FUNCTION PANEL - Abnormal; Notable for the following components:   Total Protein 6.3 (*)    Albumin 3.4 (*)    All other components within normal limits  BRAIN NATRIURETIC PEPTIDE - Abnormal; Notable for the following components:   B Natriuretic Peptide 182.0 (*)  All other components within normal limits  D-DIMER, QUANTITATIVE  TROPONIN I (HIGH SENSITIVITY)  TROPONIN I (HIGH SENSITIVITY)    EKG EKG Interpretation  Date/Time:  Wednesday July 16 2020 08:47:03 EDT Ventricular Rate:  76 PR Interval:  129 QRS Duration: 88 QT Interval:  504 QTC Calculation: 567 R Axis:   107 Text Interpretation: Sinus  rhythm Right axis deviation Low voltage, precordial leads Abnormal lateral Q waves Borderline ST depression, anterolateral leads ST elevation, consider inferior injury Prolonged QT interval Baseline wander in lead(s) V2 Confirmed by Milton Ferguson [phone]) on 07/16/2020 10:09:39 AM  Radiology DG Chest Port 1 View  Result Date: 07/16/2020 CLINICAL DATA:  Shortness of breath EXAM: PORTABLE CHEST 1 VIEW COMPARISON:  08/27/2018 FINDINGS: Mild pulmonary vascular congestion. No pleural effusion or pneumothorax. Similar cardiomediastinal contours. Suspect enlargement of the main pulmonary artery. IMPRESSION: Mild pulmonary vascular congestion. Suspect similar enlargement of the main pulmonary artery that could indicate pulmonary arterial hypertension. Electronically Signed   By: Macy Mis M.D.   On: 07/16/2020 09:51    Procedures Procedures   Medications Ordered in ED Medications  isosorbide mononitrate (IMDUR) 24 hr tablet 30 mg (has no administration in time range)  HYDROmorphone (DILAUDID) injection 1 mg (1 mg Intravenous Given 07/16/20 0907)  ondansetron (ZOFRAN) injection 4 mg (4 mg Intravenous Given 07/16/20 0907)  sodium chloride 0.9 % bolus 500 mL (0 mLs Intravenous Stopped 07/16/20 1011)    ED Course  I have reviewed the triage vital signs and the nursing notes.  Pertinent labs & imaging results that were available during my care of the patient were reviewed by me and considered in my medical decision making (see chart for details). Patient with chest pain that is relieved by nitro.  She has 2 troponins that were normal and an EKG that did not show any acute changes.  I spoke with Dr. Johnsie Cancel cardiology and he recommended starting the patient on Imdur 30 mg once a day and they will see in the office next week   MDM Rules/Calculators/A&P                          Atypical chest pain relieved.  Patient will start on Imdur and will follow up with cardiology Final Clinical Impression(s) / ED  Diagnoses Final diagnoses:  Atypical chest pain    Rx / DC Orders ED Discharge Orders          Ordered    isosorbide mononitrate (IMDUR) 30 MG 24 hr tablet  Daily        07/16/20 1408             Milton Ferguson, MD 07/16/20 1412

## 2020-07-16 NOTE — Telephone Encounter (Signed)
-----   Message from Josue Hector, MD sent at 07/16/2020  1:47 PM EDT ----- [name] patient SSCP in ER 07/16/2020 R/O no ECG changes pain free Started on imdur 30 mg in addition to her other meds Needs lexiscan Myovue and f/u with Sam post ER visit

## 2020-07-16 NOTE — Telephone Encounter (Signed)
Called patient to schedule lexiscan, and f/u with Dr.McDowell, no answer, went to leave VM, and phone automatically hung up.

## 2020-07-16 NOTE — Discharge Instructions (Addendum)
Follow-up with your cardiologist next week.  Return sooner if any problems

## 2020-07-16 NOTE — ED Triage Notes (Addendum)
CP in center of chest that radiates to LT arm that started this morning.  Had 4 baby asa and 2 nitro's that has given her some relief of the pain. Cbg 141. Pt also c/o nausea and headache after taking the nitro

## 2020-07-16 NOTE — Telephone Encounter (Signed)
Lexiscan MOV order placed. Sent to scheduling pool. Will notify pt with appt and f/u.

## 2020-08-18 DIAGNOSIS — E1151 Type 2 diabetes mellitus with diabetic peripheral angiopathy without gangrene: Secondary | ICD-10-CM | POA: Diagnosis not present

## 2020-08-18 DIAGNOSIS — Z299 Encounter for prophylactic measures, unspecified: Secondary | ICD-10-CM | POA: Diagnosis not present

## 2020-08-18 DIAGNOSIS — R52 Pain, unspecified: Secondary | ICD-10-CM | POA: Diagnosis not present

## 2020-08-18 DIAGNOSIS — E1165 Type 2 diabetes mellitus with hyperglycemia: Secondary | ICD-10-CM | POA: Diagnosis not present

## 2020-08-18 DIAGNOSIS — I509 Heart failure, unspecified: Secondary | ICD-10-CM | POA: Diagnosis not present

## 2020-08-18 DIAGNOSIS — I10 Essential (primary) hypertension: Secondary | ICD-10-CM | POA: Diagnosis not present

## 2020-08-18 DIAGNOSIS — Z6841 Body Mass Index (BMI) 40.0 and over, adult: Secondary | ICD-10-CM | POA: Diagnosis not present

## 2020-09-12 ENCOUNTER — Other Ambulatory Visit: Payer: Self-pay

## 2020-09-12 ENCOUNTER — Ambulatory Visit (INDEPENDENT_AMBULATORY_CARE_PROVIDER_SITE_OTHER): Payer: BC Managed Care – PPO | Admitting: Cardiology

## 2020-09-12 ENCOUNTER — Encounter: Payer: Self-pay | Admitting: Cardiology

## 2020-09-12 VITALS — BP 158/66 | HR 82 | Ht 64.0 in | Wt 219.6 lb

## 2020-09-12 DIAGNOSIS — I25119 Atherosclerotic heart disease of native coronary artery with unspecified angina pectoris: Secondary | ICD-10-CM

## 2020-09-12 DIAGNOSIS — E782 Mixed hyperlipidemia: Secondary | ICD-10-CM

## 2020-09-12 DIAGNOSIS — G4733 Obstructive sleep apnea (adult) (pediatric): Secondary | ICD-10-CM | POA: Diagnosis not present

## 2020-09-12 MED ORDER — METOPROLOL SUCCINATE ER 25 MG PO TB24: 25.0000 mg | ORAL_TABLET | Freq: Every day | ORAL | 3 refills | Status: DC

## 2020-09-12 MED ORDER — ISOSORBIDE MONONITRATE ER 30 MG PO TB24: 30.0000 mg | ORAL_TABLET | Freq: Every day | ORAL | 3 refills | Status: DC

## 2020-09-12 NOTE — Patient Instructions (Signed)

## 2020-09-12 NOTE — Progress Notes (Signed)
Cardiology Office Note  Date: 09/12/2020   ID: [name], [name] 05-Nov-1955, MRN [id number]  PCP:  Monico Blitz, MD  Cardiologist:  Rozann Lesches, MD Electrophysiologist:  None   Chief Complaint  Patient presents with   Cardiac follow-up    History of Present Illness: [name] is a 65 y.o. female last seen in February by Mr. Leonides Sake NP, I reviewed the note.  She is here for a follow-up visit.  She was evaluated in the ER by Dr. Roderic Palau in June with chest discomfort.  High-sensitivity troponin levels were normal at that time and her ECG showed no acute ST segment changes.  Case discussed with Dr. Johnsie Cancel who recommended starting Imdur 30 mg daily.  She does not report any further chest pain, tolerating Imdur, but inadvertently stopped Toprol-XL which will we will resume.  Echocardiogram from January of this year done at [facility] reported normal LVEF of approximately 60%, mildly dilated left atrium, normal RV contraction, calcified aortic valve with mild aortic stenosis and mild aortic regurgitation, peak and mean AV gradient of 19 and 9 mmHg respectively, trace mitral regurgitation, mild to moderate tricuspid regurgitation, and RVSP estimated 51 mmHg.  No pericardial effusion.  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  Past Medical History:  Diagnosis Date   Anxiety    Aortic stenosis    CAD (coronary artery disease)    a. 10/2014: Ant STEMI s/p DES to dLAD   Carotid disease, bilateral ([place])    a. Duplex 12/2014: mild-mod atherosclerotic plaque without hemodynamically significant stenosis.   COPD (chronic obstructive pulmonary disease) (HCC)    Essential hypertension    HLD (hyperlipidemia)    Morbid obesity ([place])    Myocardial infarction ([place]) 11/17/2014   Shingles    Sleep apnea    Uses CPAP   Type 2 diabetes mellitus [facility])     Past Surgical History:  Procedure  Laterality Date   ABDOMINAL HYSTERECTOMY     BREAST BIOPSY Left    2016   CARDIAC CATHETERIZATION N/A 11/17/2014   Procedure: Left Heart Cath and Coronary Angiography;  Surgeon: Wellington Hampshire, MD;  Location: [place] CV LAB;  Service: Cardiovascular;  Laterality: N/A;   CARDIAC CATHETERIZATION N/A 11/17/2014   Procedure: Coronary Stent Intervention;  Surgeon: Wellington Hampshire, MD;  Location: [place] CV LAB;  Service: Cardiovascular;  Laterality: N/A;  distal lad 2.25x20 promus   CATARACT EXTRACTION W/PHACO Left 09/20/2016   Procedure: CATARACT EXTRACTION PHACO AND INTRAOCULAR LENS PLACEMENT (IOC);  Surgeon: Tonny Branch, MD;  Location: [facility];  Service: Ophthalmology;  Laterality: Left;  CDE: 9.27   CATARACT EXTRACTION W/PHACO Right 10/18/2016   Procedure: CATARACT EXTRACTION PHACO AND INTRAOCULAR LENS PLACEMENT (IOC);  Surgeon: Tonny Branch, MD;  Location: [facility];  Service: Ophthalmology;  Laterality: Right;  CDE: 6.38   CHOLECYSTECTOMY     SPINAL FUSION     cervical; screws and plates.    Current Outpatient Medications  Medication Sig Dispense Refill   acetaminophen (TYLENOL) 500 MG tablet Take 500 mg by mouth daily as needed for moderate pain or headache.     aspirin EC 81 MG tablet Take 81 mg by mouth daily.     atorvastatin (LIPITOR) 10 MG tablet TAKE 1 TABLET (10 MG TOTAL) BY MOUTH EVERY OTHER DAY. TAKE AT DINNER 45 tablet 3   cetirizine (ZYRTEC ALLERGY) 10 MG tablet Take 1  tablet (10 mg total) by mouth daily. 30 tablet 0   dapagliflozin propanediol (FARXIGA) 10 MG TABS tablet Take 10 mg by mouth daily.     escitalopram (LEXAPRO) 20 MG tablet Take 20 mg by mouth 2 (two) times a day.     fluticasone (FLONASE) 50 MCG/ACT nasal spray Place 1 spray into both nostrils daily for 14 days. 16 g 0   furosemide (LASIX) 20 MG tablet TAKE 1 TABLET BY MOUTH EVERY DAY (Patient taking differently: Take 20 mg by mouth daily.) 90 tablet 3   lisinopril (ZESTRIL) 10 MG tablet TAKE 1 TABLET BY  MOUTH EVERY DAY (Patient taking differently: Take 10 mg by mouth daily.) 90 tablet 3   metFORMIN (GLUCOPHAGE) 1000 MG tablet Take 1,000 mg by mouth 2 (two) times daily.   6   metoprolol succinate (TOPROL-XL) 25 MG 24 hr tablet Take 1 tablet (25 mg total) by mouth daily. 90 tablet 3   SYMBICORT 160-4.5 MCG/ACT inhaler Inhale 1 puff into the lungs 2 (two) times daily.     TRESIBA FLEXTOUCH 200 UNIT/ML FlexTouch Pen Inject 40 Units into the skin 2 (two) times daily after a meal.     albuterol (PROVENTIL HFA;VENTOLIN HFA) 108 (90 Base) MCG/ACT inhaler Inhale 2 puffs into the lungs every 4 (four) hours as needed for wheezing or shortness of breath. (Patient not taking: Reported on 09/12/2020) 1 Inhaler 0   albuterol (PROVENTIL) (2.5 MG/3ML) 0.083% nebulizer solution Take 3 mLs (2.5 mg total) by nebulization every 4 (four) hours as needed for wheezing or shortness of breath. (Patient not taking: Reported on 09/12/2020) 75 mL 0   diclofenac (VOLTAREN) 25 MG EC tablet Take 1 tablet (25 mg total) by mouth 2 (two) times daily. (Patient not taking: No sig reported) 30 tablet 0   isosorbide mononitrate (IMDUR) 30 MG 24 hr tablet Take 1 tablet (30 mg total) by mouth daily. 90 tablet 3   tizanidine (ZANAFLEX) 2 MG capsule Take 1 capsule (2 mg total) by mouth 3 (three) times daily. (Patient not taking: Reported on 09/12/2020) 30 capsule 0   No current facility-administered medications for this visit.   Allergies:  Codeine and Sulfa antibiotics   ROS: No palpitations or syncope.  Physical Exam: VS:  BP (!) 158/66   Pulse 82   Ht '5\' 4"'$  (1.626 m)   Wt 219 lb 9.6 oz (99.6 kg)   SpO2 94%   BMI 37.69 kg/m , BMI Body mass index is 37.69 kg/m.  Wt Readings from Last 3 Encounters:  09/12/20 219 lb 9.6 oz (99.6 kg)  07/16/20 221 lb (100.2 kg)  03/10/20 218 lb 11 oz (99.2 kg)    General: Patient appears comfortable at rest. HEENT: Conjunctiva and lids normal, wearing a mask. Neck: Supple, no elevated JVP or  carotid bruits, no thyromegaly. Lungs: Clear to auscultation, nonlabored breathing at rest. Cardiac: Regular rate and rhythm, no S3, 2/6 systolic murmur, no pericardial rub. Extremities: No pitting edema.  ECG:  An ECG dated 07/16/2020 was personally reviewed today and demonstrated:  Sinus rhythm with rightward axis and nonspecific ST-T changes.  Recent Labwork: 07/16/2020: ALT 21; AST 19; B Natriuretic Peptide 182.0; BUN 21; Creatinine, Ser 0.74; Hemoglobin 14.0; Platelets 268; Potassium 3.8; Sodium 140  January 2022: Cholesterol 115, triglycerides 87, HDL 41, LDL 57  Other Studies Reviewed Today:  Exercise Myoview 10/24/2017: T wave inversion was noted during stress in the II, III, aVF, V5 and V6 leads after lexiscan injection. Overall nonspecific finding The study  is normal. There are no perfusion defects consistent with prior infarct or current ischemia. This is a low risk study. The left ventricular ejection fraction is normal (55-65%).  Assessment and Plan:  1.  CAD status post anterior STEMI with DES to the LAD in 2018.  She reports good angina control at this time on current regimen, tolerating Imdur.  Continue along with aspirin, Farxiga, Lipitor, lisinopril, and resume Toprol-XL 25 mg daily.  2.  Mixed hyperlipidemia, on Lipitor.  Last LDL 57.  Medication Adjustments/Labs and Tests Ordered: Current medicines are reviewed at length with the patient today.  Concerns regarding medicines are outlined above.   Tests Ordered: No orders of the defined types were placed in this encounter.   Medication Changes: Meds ordered this encounter  Medications   isosorbide mononitrate (IMDUR) 30 MG 24 hr tablet    Sig: Take 1 tablet (30 mg total) by mouth daily.    Dispense:  90 tablet    Refill:  3   metoprolol succinate (TOPROL-XL) 25 MG 24 hr tablet    Sig: Take 1 tablet (25 mg total) by mouth daily.    Dispense:  90 tablet    Refill:  3     Disposition:  Follow up  6  months.  Signed, Satira Sark, MD, [facility] 09/12/2020 2:03 PM    [facility] at [facility] [address], [place], [place] [address] Phone: [phone]; Fax: [phone]

## 2020-09-24 DIAGNOSIS — F419 Anxiety disorder, unspecified: Secondary | ICD-10-CM | POA: Diagnosis not present

## 2020-09-24 DIAGNOSIS — Z299 Encounter for prophylactic measures, unspecified: Secondary | ICD-10-CM | POA: Diagnosis not present

## 2020-09-24 DIAGNOSIS — I10 Essential (primary) hypertension: Secondary | ICD-10-CM | POA: Diagnosis not present

## 2020-09-24 DIAGNOSIS — I5032 Chronic diastolic (congestive) heart failure: Secondary | ICD-10-CM | POA: Diagnosis not present

## 2020-09-24 DIAGNOSIS — Z6841 Body Mass Index (BMI) 40.0 and over, adult: Secondary | ICD-10-CM | POA: Diagnosis not present

## 2020-10-20 NOTE — Progress Notes (Deleted)
PATIENT: [name] DOB: October 10, 1955  REASON FOR VISIT: follow up HISTORY FROM: patient PRIMARY NEUROLOGIST:   Virtual Visit via Video Note  I connected with HANIN DECOOK on 10/21/20 at  1:45 PM EDT by a video enabled telemedicine application located remotely at [facility] and verified that I am speaking with the correct person using two identifiers who was located at their own home.   I discussed the limitations of evaluation and management by telemedicine and the availability of in person appointments. The patient expressed understanding and agreed to proceed.   PATIENT: [name] DOB: 02/13/1955  REASON FOR VISIT: follow up HISTORY FROM: patient  HISTORY OF PRESENT ILLNESS: Today 10/21/20  HISTORY   REVIEW OF SYSTEMS: Out of a complete 14 system review of symptoms, the patient complains only of the following symptoms, and all other reviewed systems are negative.  ALLERGIES: Allergies  Allergen Reactions   Codeine Nausea And Vomiting   Sulfa Antibiotics Rash    HOME MEDICATIONS: Outpatient Medications Prior to Visit  Medication Sig Dispense Refill   acetaminophen (TYLENOL) 500 MG tablet Take 500 mg by mouth daily as needed for moderate pain or headache.     albuterol (PROVENTIL HFA;VENTOLIN HFA) 108 (90 Base) MCG/ACT inhaler Inhale 2 puffs into the lungs every 4 (four) hours as needed for wheezing or shortness of breath. (Patient not taking: Reported on 09/12/2020) 1 Inhaler 0   albuterol (PROVENTIL) (2.5 MG/3ML) 0.083% nebulizer solution Take 3 mLs (2.5 mg total) by nebulization every 4 (four) hours as needed for wheezing or shortness of breath. (Patient not taking: Reported on 09/12/2020) 75 mL 0   aspirin EC 81 MG tablet Take 81 mg by mouth daily.     atorvastatin (LIPITOR) 10 MG tablet TAKE 1 TABLET (10 MG TOTAL) BY MOUTH EVERY OTHER DAY. TAKE AT DINNER 45 tablet 3   cetirizine (ZYRTEC ALLERGY) 10 MG tablet Take 1 tablet (10 mg total)  by mouth daily. 30 tablet 0   dapagliflozin propanediol (FARXIGA) 10 MG TABS tablet Take 10 mg by mouth daily.     diclofenac (VOLTAREN) 25 MG EC tablet Take 1 tablet (25 mg total) by mouth 2 (two) times daily. (Patient not taking: No sig reported) 30 tablet 0   escitalopram (LEXAPRO) 20 MG tablet Take 20 mg by mouth 2 (two) times a day.     fluticasone (FLONASE) 50 MCG/ACT nasal spray Place 1 spray into both nostrils daily for 14 days. 16 g 0   furosemide (LASIX) 20 MG tablet TAKE 1 TABLET BY MOUTH EVERY DAY (Patient taking differently: Take 20 mg by mouth daily.) 90 tablet 3   isosorbide mononitrate (IMDUR) 30 MG 24 hr tablet Take 1 tablet (30 mg total) by mouth daily. 90 tablet 3   lisinopril (ZESTRIL) 10 MG tablet TAKE 1 TABLET BY MOUTH EVERY DAY (Patient taking differently: Take 10 mg by mouth daily.) 90 tablet 3   metFORMIN (GLUCOPHAGE) 1000 MG tablet Take 1,000 mg by mouth 2 (two) times daily.   6   metoprolol succinate (TOPROL-XL) 25 MG 24 hr tablet Take 1 tablet (25 mg total) by mouth daily. 90 tablet 3   SYMBICORT 160-4.5 MCG/ACT inhaler Inhale 1 puff into the lungs 2 (two) times daily.     tizanidine (ZANAFLEX) 2 MG capsule Take 1 capsule (2 mg total) by mouth 3 (three) times daily. (Patient not taking: Reported on 09/12/2020) 30 capsule 0   TRESIBA FLEXTOUCH 200 UNIT/ML FlexTouch Pen  Inject 40 Units into the skin 2 (two) times daily after a meal.     No facility-administered medications prior to visit.    PAST MEDICAL HISTORY: Past Medical History:  Diagnosis Date   Anxiety    Aortic stenosis    CAD (coronary artery disease)    a. 10/2014: Ant STEMI s/p DES to dLAD   Carotid disease, bilateral ([place])    a. Duplex 12/2014: mild-mod atherosclerotic plaque without hemodynamically significant stenosis.   COPD (chronic obstructive pulmonary disease) (HCC)    Essential hypertension    HLD (hyperlipidemia)    Morbid obesity ([place])    Myocardial infarction ([place]) 11/17/2014   Shingles     Sleep apnea    Uses CPAP   Type 2 diabetes mellitus ([place])     PAST SURGICAL HISTORY: Past Surgical History:  Procedure Laterality Date   ABDOMINAL HYSTERECTOMY     BREAST BIOPSY Left    2016   CARDIAC CATHETERIZATION N/A 11/17/2014   Procedure: Left Heart Cath and Coronary Angiography;  Surgeon: Wellington Hampshire, MD;  Location: [place] [facility];  Service: Cardiovascular;  Laterality: N/A;   CARDIAC CATHETERIZATION N/A 11/17/2014   Procedure: Coronary Stent Intervention;  Surgeon: Wellington Hampshire, MD;  Location: [place] [facility];  Service: Cardiovascular;  Laterality: N/A;  distal lad 2.25x20 promus   CATARACT EXTRACTION W/PHACO Left 09/20/2016   Procedure: CATARACT EXTRACTION PHACO AND INTRAOCULAR LENS PLACEMENT (IOC);  Surgeon: Tonny Branch, MD;  Location: [facility];  Service: Ophthalmology;  Laterality: Left;  CDE: 9.27   CATARACT EXTRACTION W/PHACO Right 10/18/2016   Procedure: CATARACT EXTRACTION PHACO AND INTRAOCULAR LENS PLACEMENT (IOC);  Surgeon: Tonny Branch, MD;  Location: [facility];  Service: Ophthalmology;  Laterality: Right;  CDE: 6.38   CHOLECYSTECTOMY     SPINAL FUSION     cervical; screws and plates.    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  Tobacco Use   Smoking status: Some Days    Packs/day: 0.50    Years: 30.00    Pack years: 15.00    Types: Cigarettes    Start date: 12/26/1972   Smokeless tobacco: Never   Tobacco comments:    3 cigs per day  Vaping Use   Vaping Use: Never used  Substance and Sexual Activity   Alcohol use: No    Alcohol/week: 0.0 standard drinks   Drug  use: No   Sexual activity: Yes    Partners: Male    Birth control/protection: Surgical  Other Topics Concern   Not on file  Social History Narrative   Not on file   Social Determinants of Health   Financial Resource Strain: Not on file  Food Insecurity: Not on file  Transportation Needs: Not on file  Physical Activity: Not on file  Stress: Not on file  Social Connections: Not on file  Intimate Partner Violence: Not on file      PHYSICAL EXAM Generalized: Well developed, in no acute distress   Neurological examination  Mentation: Alert oriented to time, place,  history taking. Follows all commands speech and language fluent Cranial nerve II-XII:Extraocular movements were full. Facial symmetry noted. uvula tongue midline. Head turning and shoulder shrug  were normal and symmetric. Motor: Good strength throughout subjectively per patient Sensory: Sensory testing is intact to soft touch on all 4 extremities subjectively per patient Coordination: Cerebellar testing reveals good finger-nose-finger  Gait and station: Patient is able to stand from a seated position. gait is normal.  Reflexes: UTA  DIAGNOSTIC DATA (LABS, IMAGING, TESTING) - I reviewed patient records, labs, notes, testing and imaging myself where available.  Lab Results  Component Value Date   WBC 7.9 07/16/2020   HGB 14.0 07/16/2020   HCT 44.7 07/16/2020   MCV 96.1 07/16/2020   PLT 268 07/16/2020      Component Value Date/Time   NA 140 07/16/2020 0922   K 3.8 07/16/2020 0922   CL 104 07/16/2020 0922   CO2 26 07/16/2020 0922   GLUCOSE 187 (H) 07/16/2020 0922   BUN 21 07/16/2020 0922   CREATININE 0.74 07/16/2020 0922   CREATININE 0.81 01/14/2015 1557   CALCIUM 8.9 07/16/2020 0922   PROT 6.3 (L) 07/16/2020 0922   ALBUMIN 3.4 (L) 07/16/2020 0922   AST 19 07/16/2020 0922   ALT 21 07/16/2020 0922   ALKPHOS 56 07/16/2020 0922   BILITOT 0.5 07/16/2020 0922   GFRNONAA >60 07/16/2020 0922   GFRAA >60  10/27/2019 1945   Lab Results  Component Value Date   CHOL 107 (L) 11/07/2015   HDL 38 (L) 11/07/2015   LDLCALC 38 11/07/2015   TRIG 154 (H) 11/07/2015   CHOLHDL 2.8 11/07/2015   Lab Results  Component Value Date   HGBA1C 8.4 (H) 08/25/2018   No results found for: [id number] Lab Results  Component Value Date   TSH 2.063 01/14/2015      ASSESSMENT AND PLAN 65 y.o. year old female  has a past medical history of Anxiety, Aortic stenosis, CAD (coronary artery disease), Carotid disease, bilateral ([place]), COPD (chronic obstructive pulmonary disease) ([place]), Essential hypertension, HLD (hyperlipidemia), Morbid obesity ([place]), Myocardial infarction ([place]) (11/17/2014), Shingles, Sleep apnea, and Type 2 diabetes mellitus ([place]). here with:  OSA on BiPAP  BiPAP compliance excellent Residual AHI is good Encouraged patient to continue using BiPAP nightly and > 4 hours each night F/U in 1 year or sooner if needed  I spent 20 minutes of face-to-face and non-face-to-face time with patient.  This included previsit chart review, lab review, study review, order entry, electronic health record documentation, patient education.  Ward Givens, MSN, NP-C 10/21/2020, 1:16 PM [facility] [address] [place], [place] [address] [phone]

## 2020-10-21 ENCOUNTER — Telehealth: Payer: BC Managed Care – PPO | Admitting: Adult Health

## 2020-10-24 ENCOUNTER — Ambulatory Visit
Admission: RE | Admit: 2020-10-24 | Discharge: 2020-10-24 | Disposition: A | Discharge status: Home / Self Care | Payer: BC Managed Care – PPO | Source: Ambulatory Visit | Attending: Internal Medicine | Admitting: Internal Medicine

## 2020-10-24 VITALS — BP 162/77 | HR 67 | Temp 98.2°F | Resp 16

## 2020-10-24 DIAGNOSIS — J029 Acute pharyngitis, unspecified: Secondary | ICD-10-CM

## 2020-10-24 DIAGNOSIS — J441 Chronic obstructive pulmonary disease with (acute) exacerbation: Secondary | ICD-10-CM | POA: Diagnosis not present

## 2020-10-24 MED ORDER — ALBUTEROL SULFATE HFA 108 (90 BASE) MCG/ACT IN AERS
2.0000 | INHALATION_SPRAY | Freq: Four times a day (QID) | RESPIRATORY_TRACT | 0 refills | Status: DC | PRN
Start: 2020-10-24 — End: 2020-11-13

## 2020-10-24 MED ORDER — BENZONATATE 100 MG PO CAPS: 100.0000 mg | ORAL_CAPSULE | Freq: Three times a day (TID) | ORAL | 0 refills | Status: DC | PRN

## 2020-10-24 MED ORDER — PREDNISONE 20 MG PO TABS: 20.0000 mg | ORAL_TABLET | Freq: Every day | ORAL | 0 refills | Status: AC

## 2020-10-24 MED ORDER — FLUTICASONE PROPIONATE 50 MCG/ACT NA SUSP: 1.0000 | Freq: Every day | NASAL | 0 refills | Status: DC

## 2020-10-24 NOTE — ED Triage Notes (Addendum)
Headache, cough, congestion and body aches that started yesterday. Neg at home covid test

## 2020-10-24 NOTE — ED Provider Notes (Signed)
[facility]    CSN: [id number] Arrival date & time: 10/24/20  1446      History   Chief Complaint No chief complaint on file.   HPI [name] is a 65 y.o. female with a history of COPD comes to the urgent care with 1 day history of headache, cough, nasal congestion, sore throat and generalized body aches.  Patient's symptoms started yesterday and has been persistent.  She has some shortness of breath with normal sputum production.  She has intermittent wheezing with the shortness of breath.  She did a home COVID test which was negative.  No nausea, vomiting or diarrhea.  No rash noted.  Patient denies any chest pain.  She has several coworkers who have similar symptoms.  Patient is fully vaccinated against COVID-19 virus. HPI  Past Medical History:  Diagnosis Date   Anxiety    Aortic stenosis    CAD (coronary artery disease)    a. 10/2014: Ant STEMI s/p DES to dLAD   Carotid disease, bilateral ([place])    a. Duplex 12/2014: mild-mod atherosclerotic plaque without hemodynamically significant stenosis.   COPD (chronic obstructive pulmonary disease) (HCC)    Essential hypertension    HLD (hyperlipidemia)    Morbid obesity (HCC)    Myocardial infarction ([place]) 11/17/2014   Shingles    Sleep apnea    Uses CPAP   Type 2 diabetes mellitus [facility])     Patient Active Problem List   Diagnosis Date Noted   Acute diastolic CHF (congestive heart failure) ([place]) 08/27/2018   Acute respiratory failure with hypoxia (HCC) 08/27/2018   Pleural effusion due to CHF (congestive heart failure) ([place]) 08/27/2018   Hypokalemia 08/27/2018   Abdominal wall cellulitis 08/25/2018   Anasarca 08/25/2018   Hypoxia 08/25/2018   Backache 08/25/2018   Anxiety 08/25/2018   Morbid obesity ([place]) 01/14/2015   CAD (coronary artery disease)    COPD (chronic obstructive pulmonary disease) (HCC)    Hypertension    DM type 2 causing vascular disease (HCC)    Current smoker    HLD (hyperlipidemia)     Aortic stenosis    ST elevation (STEMI) myocardial infarction involving left anterior descending coronary artery ([place]) 11/17/2014    Past Surgical History:  Procedure Laterality Date   ABDOMINAL HYSTERECTOMY     BREAST BIOPSY Left    2016   CARDIAC CATHETERIZATION N/A 11/17/2014   Procedure: Left Heart Cath and Coronary Angiography;  Surgeon: Wellington Hampshire, MD;  Location: [place] [facility];  Service: Cardiovascular;  Laterality: N/A;   CARDIAC CATHETERIZATION N/A 11/17/2014   Procedure: Coronary Stent Intervention;  Surgeon: Wellington Hampshire, MD;  Location: [place] [facility];  Service: Cardiovascular;  Laterality: N/A;  distal lad 2.25x20 promus   CATARACT EXTRACTION W/PHACO Left 09/20/2016   Procedure: CATARACT EXTRACTION PHACO AND INTRAOCULAR LENS PLACEMENT (IOC);  Surgeon: Tonny Branch, MD;  [facility];  Service: Ophthalmology;  Laterality: Left;  CDE: 9.27   CATARACT EXTRACTION W/PHACO Right 10/18/2016   Procedure: CATARACT EXTRACTION PHACO AND INTRAOCULAR LENS PLACEMENT (IOC);  Surgeon: Tonny Branch, MD;  Location: [facility];  Service: Ophthalmology;  Laterality: Right;  CDE: 6.38   CHOLECYSTECTOMY     SPINAL FUSION     cervical; screws and plates.    OB History     Gravida  1   Para  1   Term  1   Preterm      AB      Living  1      SAB      IAB      Ectopic      Multiple      Live Births               Home Medications    Prior to Admission medications   Medication Sig Start Date End Date Taking? Authorizing Provider  albuterol (VENTOLIN HFA) 108 (90 Base) MCG/ACT inhaler Inhale 2 puffs into the lungs every 6 (six) hours as needed for wheezing or shortness of breath. 10/24/20  Yes Zamari Vea, Myrene Galas, MD  benzonatate (TESSALON) 100 MG capsule Take 1 capsule (100 mg total) by mouth 3 (three) times daily as needed for cough. 10/24/20  Yes Victoriano Campion, Myrene Galas, MD  fluticasone (FLONASE) 50 MCG/ACT nasal spray Place 1 spray into both nostrils daily.  10/24/20  Yes Lyza Houseworth, Myrene Galas, MD  predniSONE (DELTASONE) 20 MG tablet Take 1 tablet (20 mg total) by mouth daily for 5 days. 10/24/20 10/29/20 Yes Fausto Sampedro, Myrene Galas, MD  acetaminophen (TYLENOL) 500 MG tablet Take 500 mg by mouth daily as needed for moderate pain or headache.    [provider]  aspirin EC 81 MG tablet Take 81 mg by mouth daily.    [provider]  atorvastatin (LIPITOR) 10 MG tablet TAKE 1 TABLET (10 MG TOTAL) BY MOUTH EVERY OTHER DAY. TAKE AT [facility] 09/25/18 09/12/20  Satira Sark, MD  cetirizine (ZYRTEC ALLERGY) 10 MG tablet Take 1 tablet (10 mg total) by mouth daily. 02/04/20   Avegno, Darrelyn Hillock, FNP  dapagliflozin propanediol (FARXIGA) 10 MG TABS tablet Take 10 mg by mouth daily.    [provider]  escitalopram (LEXAPRO) 20 MG tablet Take 20 mg by mouth 2 (two) times a day.    [provider]  furosemide (LASIX) 20 MG tablet TAKE 1 TABLET BY MOUTH EVERY DAY Patient taking differently: Take 20 mg by mouth daily. 06/24/20   Satira Sark, MD  isosorbide mononitrate (IMDUR) 30 MG 24 hr tablet Take 1 tablet (30 mg total) by mouth daily. 09/12/20   Satira Sark, MD  lisinopril (ZESTRIL) 10 MG tablet TAKE 1 TABLET BY MOUTH EVERY DAY Patient taking differently: Take 10 mg by mouth daily. 05/21/20   Satira Sark, MD  metFORMIN (GLUCOPHAGE) 1000 MG tablet Take 1,000 mg by mouth 2 (two) times daily.  09/26/14   [provider]  metoprolol succinate (TOPROL-XL) 25 MG 24 hr tablet Take 1 tablet (25 mg total) by mouth daily. 09/12/20   Satira Sark, MD  SYMBICORT 160-4.5 MCG/ACT inhaler Inhale 1 puff into the lungs 2 (two) times daily. 08/18/20   [provider]  tizanidine (ZANAFLEX) 2 MG capsule Take 1 capsule (2 mg total) by mouth 3 (three) times daily. Patient not taking: Reported on 09/12/2020 10/22/19   Wurst, [place], PA-C  TRESIBA FLEXTOUCH 200 UNIT/ML FlexTouch Pen Inject 40 Units into the skin 2 (two) times  daily after a meal. 09/28/19   [provider]    Family History Family History  Problem Relation Age of Onset   Heart attack Father    Heart disease Father    Cancer Brother     Social History Social History   Tobacco Use   Smoking status: Some Days    Packs/day: 0.50    Years: 30.00    Pack years: 15.00    Types: Cigarettes    Start date: 12/26/1972   Smokeless tobacco: Never  Tobacco comments:    3 cigs per day  Vaping Use   Vaping Use: Never used  Substance Use Topics   Alcohol use: No    Alcohol/week: 0.0 standard drinks   Drug use: No     Allergies   Codeine and Sulfa antibiotics   Review of Systems Review of Systems  Constitutional: Negative.   HENT:  Positive for congestion and sore throat. Negative for postnasal drip, sinus pressure and sinus pain.   Respiratory:  Positive for cough, shortness of breath and wheezing.   Cardiovascular: Negative.  Negative for chest pain.  Gastrointestinal: Negative.  Negative for nausea and vomiting.  Genitourinary: Negative.   Neurological:  Positive for headaches.    Physical Exam Triage Vital Signs ED Triage Vitals  Enc Vitals Group     BP 10/24/20 1523 (!) 162/77     Pulse Rate 10/24/20 1523 67     Resp 10/24/20 1523 16     Temp 10/24/20 1523 98.2 F (36.8 C)     Temp Source 10/24/20 1523 Oral     SpO2 10/24/20 1523 93 %     Weight --      Height --      Head Circumference --      Peak Flow --      Pain Score 10/24/20 1530 8     Pain Loc --      Pain Edu? --      Excl. in [place]? --    No data found.  Updated Vital Signs BP (!) 162/77 (BP Location: Right Arm)   Pulse 67   Temp 98.2 F (36.8 C) (Oral)   Resp 16   SpO2 93%   Visual Acuity Right Eye Distance:   Left Eye Distance:   Bilateral Distance:    Right Eye Near:   Left Eye Near:    Bilateral Near:     Physical Exam Vitals and nursing note reviewed.  HENT:     Right Ear: Tympanic membrane normal.     Left Ear: Tympanic  membrane normal.     Mouth/Throat:     Mouth: Mucous membranes are moist.     Pharynx: No oropharyngeal exudate or posterior oropharyngeal erythema.  Eyes:     Extraocular Movements: Extraocular movements intact.     Pupils: Pupils are equal, round, and reactive to light.  Cardiovascular:     Rate and Rhythm: Normal rate and regular rhythm.     Pulses: Normal pulses.     Heart sounds: Normal heart sounds.  Pulmonary:     Effort: Pulmonary effort is normal.     Breath sounds: Normal breath sounds.  Abdominal:     General: Bowel sounds are normal.     Palpations: Abdomen is soft.  Skin:    Capillary Refill: Capillary refill takes less than 2 seconds.  Neurological:     Mental Status: She is alert.     UC Treatments / Results  Labs (all labs ordered are listed, but only abnormal results are displayed) Labs Reviewed - No data to display  EKG   Radiology No results found.  Procedures Procedures (including critical care time)  Medications Ordered in UC Medications - No data to display  Initial Impression / Assessment and Plan / UC Course  I have reviewed the triage vital signs and the nursing notes.  Pertinent labs & imaging results that were available during my care of the patient were reviewed by me and considered in my medical decision making (  see chart for details).     1.  Acute viral pharyngitis: 2.  COPD with acute exacerbation: Albuterol inhaler as needed for shortness of breath/wheezing Tessalon Perles as needed for cough Prednisone 20 mg orally daily for 5 days Fluticasone nasal spray Chest exam is unremarkable If symptoms worsen please return to urgent care to be reevaluated. Final Clinical Impressions(s) / UC Diagnoses   Final diagnoses:  Acute viral pharyngitis  COPD exacerbation [facility])     Discharge Instructions      Increase oral fluid intake  Please use medications as prescribed We will call you with recommendations if symptoms persist or  worsens   ED Prescriptions     Medication Sig Dispense Auth. Provider   albuterol (VENTOLIN HFA) 108 (90 Base) MCG/ACT inhaler Inhale 2 puffs into the lungs every 6 (six) hours as needed for wheezing or shortness of breath. 18 g Kabella Cassidy, Myrene Galas, MD   benzonatate (TESSALON) 100 MG capsule Take 1 capsule (100 mg total) by mouth 3 (three) times daily as needed for cough. 21 capsule Oney Tatlock, Myrene Galas, MD   predniSONE (DELTASONE) 20 MG tablet Take 1 tablet (20 mg total) by mouth daily for 5 days. 5 tablet Cameron Katayama, Myrene Galas, MD   fluticasone (FLONASE) 50 MCG/ACT nasal spray Place 1 spray into both nostrils daily. 18 g Tameia Rafferty, Myrene Galas, MD      PDMP not reviewed this encounter.   Chase Picket, MD 10/24/20 [phone]

## 2020-10-24 NOTE — Discharge Instructions (Addendum)
Increase oral fluid intake  Please use medications as prescribed We will call you with recommendations if symptoms persist or worsens

## 2020-11-13 ENCOUNTER — Other Ambulatory Visit: Payer: Self-pay

## 2020-11-13 ENCOUNTER — Ambulatory Visit
Admission: EM | Admit: 2020-11-13 | Discharge: 2020-11-13 | Disposition: A | Discharge status: Home / Self Care | Payer: BC Managed Care – PPO | Attending: Emergency Medicine | Admitting: Emergency Medicine

## 2020-11-13 DIAGNOSIS — J069 Acute upper respiratory infection, unspecified: Secondary | ICD-10-CM

## 2020-11-13 DIAGNOSIS — Z1152 Encounter for screening for COVID-19: Secondary | ICD-10-CM

## 2020-11-13 DIAGNOSIS — Z20822 Contact with and (suspected) exposure to covid-19: Secondary | ICD-10-CM | POA: Diagnosis not present

## 2020-11-13 MED ORDER — HYDROCOD POLST-CPM POLST ER 10-8 MG/5ML PO SUER: 5.0000 mL | Freq: Two times a day (BID) | ORAL | 0 refills | Status: DC | PRN

## 2020-11-13 MED ORDER — IBUPROFEN 600 MG PO TABS: 600.0000 mg | ORAL_TABLET | Freq: Four times a day (QID) | ORAL | 0 refills | Status: DC | PRN

## 2020-11-13 MED ORDER — ALBUTEROL SULFATE HFA 108 (90 BASE) MCG/ACT IN AERS: 1.0000 | INHALATION_SPRAY | RESPIRATORY_TRACT | 0 refills | Status: DC | PRN

## 2020-11-13 NOTE — Discharge Instructions (Addendum)
You will be a candidate for antivirals if flu or COVID is positive.  I will prescribe Molnupiravir if your COVID is positive, and Tamiflu if your flu is positive.  Start saline nasal irrigation with a Milta Deiters Med rinse and distilled water as often as you want, Flonase, Mucinex for the nasal congestion.  2 puffs from your albuterol inhaler every 4 hours for 2 days, then every 6 hours for 2 days, then as needed.  Make sure you use your spacer.  May back off on the albuterol if you start to feel better sooner.  Take 1000 mg of Tylenol combined with 600 mg of ibuprofen together 3-4 times a day as needed for body aches, headaches.  Push fluids, Tussionex for the cough at night, honey/lemon tea.

## 2020-11-13 NOTE — ED Provider Notes (Signed)
HPI  SUBJECTIVE:  [name] is a 65 y.o. female who presents with 2 days of body aches, headaches, chills, nasal congestion, sore throat, chest congestion, nonproductive cough.  She reports chest soreness secondary to the cough.  No fevers, rhinorrhea, postnasal drip, loss of sense of smell or taste, nausea, vomiting, diarrhea, abdominal pain, other chest pain.  No change in her baseline wheezing or shortness of breath.  She had multiple exposures to COVID at work .  last 1 was 2 days ago.  No known flu exposure.  She got the second dose of the COVID-vaccine.  She did not not yet get the flu vaccine.  No antipyretic in the past 6 hours.  She has been taking Tessalon and alternating 500 mg of Tylenol with 800 mg of ibuprofen every 6 to 8 hours without improvement in her symptoms.  No aggravating factors.  She has a past medical history of MI status post stent.  She is not on any anticoagulants or antiplatelets.  She has a history of coronary artery disease, diabetes, hypertension, CHF, COPD, and continues to smoke.  She is on Symbicort and has a spacer for her COPD.  PMD: [facility].   Past Medical History:  Diagnosis Date   Anxiety    Aortic stenosis    CAD (coronary artery disease)    a. 10/2014: Ant STEMI s/p DES to dLAD   Carotid disease, bilateral ([place])    a. Duplex 12/2014: mild-mod atherosclerotic plaque without hemodynamically significant stenosis.   COPD (chronic obstructive pulmonary disease) (HCC)    Essential hypertension    HLD (hyperlipidemia)    Morbid obesity ([place])    Myocardial infarction ([place]) 11/17/2014   Shingles    Sleep apnea    Uses CPAP   Type 2 diabetes mellitus [facility])     Past Surgical History:  Procedure Laterality Date   ABDOMINAL HYSTERECTOMY     BREAST BIOPSY Left    2016   CARDIAC CATHETERIZATION N/A 11/17/2014   Procedure: Left Heart Cath and Coronary Angiography;  Surgeon: Wellington Hampshire, MD;  Location: [facility];  Service:  Cardiovascular;  Laterality: N/A;   CARDIAC CATHETERIZATION N/A 11/17/2014   Procedure: Coronary Stent Intervention;  Surgeon: Wellington Hampshire, MD;  Location: [facility];  Service: Cardiovascular;  Laterality: N/A;  distal lad 2.25x20 promus   CATARACT EXTRACTION W/PHACO Left 09/20/2016   Procedure: CATARACT EXTRACTION PHACO AND INTRAOCULAR LENS PLACEMENT (IOC);  Surgeon: Tonny Branch, MD;  Location: [facility];  Service: Ophthalmology;  Laterality: Left;  CDE: 9.27   CATARACT EXTRACTION W/PHACO Right 10/18/2016   Procedure: CATARACT EXTRACTION PHACO AND INTRAOCULAR LENS PLACEMENT (IOC);  Surgeon: Tonny Branch, MD;  Location: [facility];  Service: Ophthalmology;  Laterality: Right;  CDE: 6.38   CHOLECYSTECTOMY     SPINAL FUSION     cervical; screws and plates.    Family History  Problem Relation Age of Onset   Heart attack Father    Heart disease Father    Cancer Brother     Social History   Tobacco Use   Smoking status: Some Days    Packs/day: 0.50    Years: 30.00    Pack years: 15.00    Types: Cigarettes    Start date: 12/26/1972   Smokeless tobacco: Never   Tobacco comments:    3 cigs per day  Vaping Use   Vaping Use: Never used  Substance Use Topics   Alcohol use: No  Alcohol/week: 0.0 standard drinks   Drug use: No    No current facility-administered medications for this encounter.  Current Outpatient Medications:    albuterol (VENTOLIN HFA) 108 (90 Base) MCG/ACT inhaler, Inhale 1-2 puffs into the lungs every 4 (four) hours as needed for wheezing or shortness of breath., Disp: 1 each, Rfl: 0   chlorpheniramine-HYDROcodone (TUSSIONEX PENNKINETIC ER) 10-8 MG/5ML SUER, Take 5 mLs by mouth every 12 (twelve) hours as needed for cough., Disp: 60 mL, Rfl: 0   ibuprofen (ADVIL) 600 MG tablet, Take 1 tablet (600 mg total) by mouth every 6 (six) hours as needed., Disp: 30 tablet, Rfl: 0   acetaminophen (TYLENOL) 500 MG tablet, Take 500 mg by mouth daily as needed for moderate  pain or headache., Disp: , Rfl:    aspirin EC 81 MG tablet, Take 81 mg by mouth daily., Disp: , Rfl:    atorvastatin (LIPITOR) 10 MG tablet, TAKE 1 TABLET (10 MG TOTAL) BY MOUTH EVERY OTHER DAY. TAKE AT DINNER, Disp: 45 tablet, Rfl: 3   benzonatate (TESSALON) 100 MG capsule, Take 1 capsule (100 mg total) by mouth 3 (three) times daily as needed for cough., Disp: 21 capsule, Rfl: 0   dapagliflozin propanediol (FARXIGA) 10 MG TABS tablet, Take 10 mg by mouth daily., Disp: , Rfl:    escitalopram (LEXAPRO) 20 MG tablet, Take 20 mg by mouth 2 (two) times a day., Disp: , Rfl:    fluticasone (FLONASE) 50 MCG/ACT nasal spray, Place 1 spray into both nostrils daily., Disp: 18 g, Rfl: 0   furosemide (LASIX) 20 MG tablet, TAKE 1 TABLET BY MOUTH EVERY DAY (Patient taking differently: Take 20 mg by mouth daily.), Disp: 90 tablet, Rfl: 3   isosorbide mononitrate (IMDUR) 30 MG 24 hr tablet, Take 1 tablet (30 mg total) by mouth daily., Disp: 90 tablet, Rfl: 3   lisinopril (ZESTRIL) 10 MG tablet, TAKE 1 TABLET BY MOUTH EVERY DAY (Patient taking differently: Take 10 mg by mouth daily.), Disp: 90 tablet, Rfl: 3   metFORMIN (GLUCOPHAGE) 1000 MG tablet, Take 1,000 mg by mouth 2 (two) times daily. , Disp: , Rfl: 6   metoprolol succinate (TOPROL-XL) 25 MG 24 hr tablet, Take 1 tablet (25 mg total) by mouth daily., Disp: 90 tablet, Rfl: 3   SYMBICORT 160-4.5 MCG/ACT inhaler, Inhale 1 puff into the lungs 2 (two) times daily., Disp: , Rfl:    tizanidine (ZANAFLEX) 2 MG capsule, Take 1 capsule (2 mg total) by mouth 3 (three) times daily. (Patient not taking: Reported on 09/12/2020), Disp: 30 capsule, Rfl: 0   TRESIBA FLEXTOUCH 200 UNIT/ML FlexTouch Pen, Inject 40 Units into the skin 2 (two) times daily after a meal., Disp: , Rfl:   Allergies  Allergen Reactions   Codeine Nausea And Vomiting   Sulfa Antibiotics Rash     ROS  As noted in HPI.   Physical Exam  BP (S) (!) 152/82 (BP Location: Right Arm) Comment: did not  take BP meds  Pulse 70   Temp 97.6 F (36.4 C) (Temporal)   Resp 16   SpO2 93%   Constitutional: Well developed, well nourished, no acute distress.  Appears ill. Eyes:  EOMI, conjunctiva normal bilaterally HENT: Normocephalic, atraumatic,mucus membranes moist.  Positive nasal congestion.  Frontal sinus tenderness.  Normal turbinates.  Tonsils surgically absent.  Slightly erythematous oropharynx.  No obvious postnasal drip. Neck: Positive cervical lymphadenopathy Respiratory: Normal inspiratory effort, lungs clear bilaterally.  As of anterior and lateral chest wall tenderness Cardiovascular:  Normal rate, regular rhythm, no murmurs rubs or gallop GI: nondistended skin: No rash, skin intact Musculoskeletal: no deformities Neurologic: Alert & oriented x 3, no focal neuro deficits Psychiatric: Speech and behavior appropriate   ED Course   Medications - No data to display  Orders Placed This Encounter  Procedures   Novel Coronavirus, NAA ([facility])    Standing Status:   Standing    Number of Occurrences:   1   Covid-19, Flu A+B ([facility])    Standing Status:   Standing    Number of Occurrences:   1    No results found for this or any previous visit (from the past 24 hour(s)). No results found.  ED Clinical Impression  1. Acute upper respiratory infection   2. Encounter for screening for COVID-19   3. Close exposure to COVID-19 virus      ED Assessment/Plan  [facility] narcotic database reviewed.  Feel That is appropriate to proceed with a controlled substance prescription.  Sent off [place], flu.  She will be a candidate for antivirals if flu or COVID are positive.  If COVID is positive, plan to prescribe Molnupiravir.  If flu is positive, plan to prescribe Tamiflu.  In the meantime, she is to start saline nasal irrigation and Flonase that she already has at home.  Regularly scheduled albuterol for the next 4 days using a spacer that she already has.  Tylenol/ibuprofen,  push fluids, Tussionex, honey lemon tea.  Follow-up with PMD as needed.  ER return precautions given.  COVID, flu pending at the time of signing of this note  Discussed labs,MDM, treatment plan, and plan for follow-up with patient. Discussed sn/sx that should prompt return to the ED. patient agrees with plan.   Meds ordered this encounter  Medications   albuterol (VENTOLIN HFA) 108 (90 Base) MCG/ACT inhaler    Sig: Inhale 1-2 puffs into the lungs every 4 (four) hours as needed for wheezing or shortness of breath.    Dispense:  1 each    Refill:  0   chlorpheniramine-HYDROcodone (TUSSIONEX PENNKINETIC ER) 10-8 MG/5ML SUER    Sig: Take 5 mLs by mouth every 12 (twelve) hours as needed for cough.    Dispense:  60 mL    Refill:  0   ibuprofen (ADVIL) 600 MG tablet    Sig: Take 1 tablet (600 mg total) by mouth every 6 (six) hours as needed.    Dispense:  30 tablet    Refill:  0      *This clinic note was created using [employer]. Therefore, there may be occasional mistakes despite careful proofreading.  ?    Melynda Ripple, MD 11/14/20 [phone]

## 2020-11-13 NOTE — ED Triage Notes (Signed)
Patient presents to Urgent Care with complaints of cough, headache, chills, and body aches since yesterday. Pt had a positive COVID exposure last Friday, Monday, Tuesday. Treating symptoms with ibuprofen and tylenol.   Denies fever.

## 2020-11-14 LAB — COVID-19, FLU A+B NAA
Influenza A, NAA: NOT DETECTED
Influenza B, NAA: NOT DETECTED
SARS-CoV-2, NAA: NOT DETECTED

## 2020-12-01 DIAGNOSIS — Z23 Encounter for immunization: Secondary | ICD-10-CM | POA: Diagnosis not present

## 2020-12-01 DIAGNOSIS — I10 Essential (primary) hypertension: Secondary | ICD-10-CM | POA: Diagnosis not present

## 2020-12-01 DIAGNOSIS — Z6839 Body mass index (BMI) 39.0-39.9, adult: Secondary | ICD-10-CM | POA: Diagnosis not present

## 2020-12-01 DIAGNOSIS — I509 Heart failure, unspecified: Secondary | ICD-10-CM | POA: Diagnosis not present

## 2020-12-01 DIAGNOSIS — F339 Major depressive disorder, recurrent, unspecified: Secondary | ICD-10-CM | POA: Diagnosis not present

## 2020-12-01 DIAGNOSIS — E1165 Type 2 diabetes mellitus with hyperglycemia: Secondary | ICD-10-CM | POA: Diagnosis not present

## 2020-12-01 DIAGNOSIS — Z299 Encounter for prophylactic measures, unspecified: Secondary | ICD-10-CM | POA: Diagnosis not present

## 2020-12-08 NOTE — Progress Notes (Signed)
[facility] [address] [place] [address] [phone]  PRIMARY NEUROLOGIST:    Virtual Visit via Telephone Note  I connected with [name] on 12/09/20 at  1:45 PM EST by telephone located remotely at [facility] and verified that I am speaking with the correct person using two identifiers who reports being located at home.    Visit scheduled by me. She discussed the limitations, risks, security and privacy concerns of performing an evaluation and management service by telephone and the availability of in person appointments. I also discussed with the patient that there may be a patient responsible charge related to this service. The patient expressed understanding and agreed to proceed. See telephone note for consent and additional scheduling information.    History of Present Illness:  [name] is a 65 y.o. female who has been followed in this office for osa on cpap.  She was scheduled for a MyChart video visit but was unable to get her camera to work.  She reports that the CPAP is working well for her.  She denies any new issues.    Observations/Objective:  Generalized: Well developed, in no acute distress   Neurological examination  Mentation: Alert oriented to time, place, history taking. Follows all commands speech and language fluent  Assessment and Plan:  1: OSA on CPAP  Good compliance Good treatment of apnea Encourage patient use CPAP nightly greater than 4 hours each night   Follow Up Instructions:   F/U in 1 year or sooner if needed    I discussed the assessment and treatment plan with the patient.  The patient was provided an opportunity to ask questions and all were answered to their satisfaction. The patient agreed with the plan and verbalized an understanding of the instructions.   I provided 9 minutes of non-face-to-face time during this encounter.    Ward Givens NP-C  [facility] [address] [place], [place] [address]  Phone [phone] Fax [phone] \

## 2020-12-09 ENCOUNTER — Telehealth (INDEPENDENT_AMBULATORY_CARE_PROVIDER_SITE_OTHER): Payer: BC Managed Care – PPO | Admitting: Adult Health

## 2020-12-09 DIAGNOSIS — G4733 Obstructive sleep apnea (adult) (pediatric): Secondary | ICD-10-CM

## 2020-12-11 DIAGNOSIS — G4733 Obstructive sleep apnea (adult) (pediatric): Secondary | ICD-10-CM | POA: Diagnosis not present

## 2020-12-23 DIAGNOSIS — I10 Essential (primary) hypertension: Secondary | ICD-10-CM | POA: Diagnosis not present

## 2020-12-23 DIAGNOSIS — Z6839 Body mass index (BMI) 39.0-39.9, adult: Secondary | ICD-10-CM | POA: Diagnosis not present

## 2020-12-23 DIAGNOSIS — E1151 Type 2 diabetes mellitus with diabetic peripheral angiopathy without gangrene: Secondary | ICD-10-CM | POA: Diagnosis not present

## 2020-12-23 DIAGNOSIS — Z299 Encounter for prophylactic measures, unspecified: Secondary | ICD-10-CM | POA: Diagnosis not present

## 2020-12-23 DIAGNOSIS — R030 Elevated blood-pressure reading, without diagnosis of hypertension: Secondary | ICD-10-CM | POA: Diagnosis not present

## 2020-12-23 DIAGNOSIS — I509 Heart failure, unspecified: Secondary | ICD-10-CM | POA: Diagnosis not present

## 2020-12-23 DIAGNOSIS — E1165 Type 2 diabetes mellitus with hyperglycemia: Secondary | ICD-10-CM | POA: Diagnosis not present

## 2021-01-05 DIAGNOSIS — Z299 Encounter for prophylactic measures, unspecified: Secondary | ICD-10-CM | POA: Diagnosis not present

## 2021-01-05 DIAGNOSIS — F419 Anxiety disorder, unspecified: Secondary | ICD-10-CM | POA: Diagnosis not present

## 2021-01-05 DIAGNOSIS — Z0000 Encounter for general adult medical examination without abnormal findings: Secondary | ICD-10-CM | POA: Diagnosis not present

## 2021-01-05 DIAGNOSIS — Z79899 Other long term (current) drug therapy: Secondary | ICD-10-CM | POA: Diagnosis not present

## 2021-01-05 DIAGNOSIS — E7800 Pure hypercholesterolemia, unspecified: Secondary | ICD-10-CM | POA: Diagnosis not present

## 2021-01-05 DIAGNOSIS — Z6841 Body Mass Index (BMI) 40.0 and over, adult: Secondary | ICD-10-CM | POA: Diagnosis not present

## 2021-01-05 DIAGNOSIS — E1165 Type 2 diabetes mellitus with hyperglycemia: Secondary | ICD-10-CM | POA: Diagnosis not present

## 2021-01-05 DIAGNOSIS — Z1331 Encounter for screening for depression: Secondary | ICD-10-CM | POA: Diagnosis not present

## 2021-01-05 LAB — FECAL OCCULT BLOOD, GUAIAC: Fecal Occult Blood: NEGATIVE

## 2021-01-08 ENCOUNTER — Encounter: Payer: Self-pay | Admitting: *Deleted

## 2021-01-10 DIAGNOSIS — G4733 Obstructive sleep apnea (adult) (pediatric): Secondary | ICD-10-CM | POA: Diagnosis not present

## 2021-01-12 DIAGNOSIS — E2839 Other primary ovarian failure: Secondary | ICD-10-CM | POA: Diagnosis not present

## 2021-01-22 ENCOUNTER — Ambulatory Visit: Payer: Self-pay

## 2021-01-23 DIAGNOSIS — B356 Tinea cruris: Secondary | ICD-10-CM | POA: Diagnosis not present

## 2021-01-23 DIAGNOSIS — I25119 Atherosclerotic heart disease of native coronary artery with unspecified angina pectoris: Secondary | ICD-10-CM | POA: Diagnosis not present

## 2021-01-23 DIAGNOSIS — I5030 Unspecified diastolic (congestive) heart failure: Secondary | ICD-10-CM | POA: Diagnosis not present

## 2021-01-23 DIAGNOSIS — Z299 Encounter for prophylactic measures, unspecified: Secondary | ICD-10-CM | POA: Diagnosis not present

## 2021-01-23 DIAGNOSIS — I110 Hypertensive heart disease with heart failure: Secondary | ICD-10-CM | POA: Diagnosis not present

## 2021-01-23 DIAGNOSIS — I10 Essential (primary) hypertension: Secondary | ICD-10-CM | POA: Diagnosis not present

## 2021-01-29 ENCOUNTER — Other Ambulatory Visit: Payer: Self-pay

## 2021-01-29 ENCOUNTER — Ambulatory Visit (INDEPENDENT_AMBULATORY_CARE_PROVIDER_SITE_OTHER): Payer: Self-pay | Admitting: *Deleted

## 2021-01-29 VITALS — Ht 64.0 in | Wt 226.6 lb

## 2021-01-29 DIAGNOSIS — Z1211 Encounter for screening for malignant neoplasm of colon: Secondary | ICD-10-CM

## 2021-01-29 NOTE — Progress Notes (Addendum)
Gastroenterology Pre-Procedure Review  Request Date: 01/29/2021 Requesting Physician: Dr. Manuella Ghazi, Last TCS done 10 or more years ago, pt thinks procedure was done at [facility], polyps per pt, no record found in Epic, family hx of colon cancer (grandmother)  PATIENT REVIEW QUESTIONS: The patient responded to the following health history questions as indicated:    1. Diabetes Melitis: yes, type II  2. Joint replacements in the past 12 months: no 3. Major health problems in the past 3 months: no 4. Has an artificial valve or MVP: no 5. Has a defibrillator: no 6. Has been advised in past to take antibiotics in advance of a procedure like teeth cleaning: no 7. Family history of colon cancer: yes, grandmother: age 55  8. Alcohol Use: no 9. Illicit drug Use: no 10. History of sleep apnea: yes, CPAP 11. History of coronary artery or other vascular stents placed within the last 12 months: no 12. History of any prior anesthesia complications: no 13. Body mass index is 38.9 kg/m.    MEDICATIONS & ALLERGIES:    Patient reports the following regarding taking any blood thinners:   Plavix? no Aspirin? Yes, 81 mg Coumadin? no Brilinta? no Xarelto? no Eliquis? no Pradaxa? no Savaysa? no Effient? no  Patient confirms/reports the following medications:  Current Outpatient Medications  Medication Sig Dispense Refill   acetaminophen (TYLENOL) 500 MG tablet Take 500 mg by mouth as needed for moderate pain or headache.     albuterol (VENTOLIN HFA) 108 (90 Base) MCG/ACT inhaler Inhale 1-2 puffs into the lungs every 4 (four) hours as needed for wheezing or shortness of breath. 1 each 0   aspirin EC 81 MG tablet Take 81 mg by mouth daily.     atorvastatin (LIPITOR) 10 MG tablet TAKE 1 TABLET (10 MG TOTAL) BY MOUTH EVERY OTHER DAY. TAKE AT DINNER 45 tablet 3   dapagliflozin propanediol (FARXIGA) 10 MG TABS tablet Take 10 mg by mouth daily.     escitalopram (LEXAPRO) 20 MG tablet Take 20 mg by mouth daily at 6  (six) AM.     fluticasone (FLONASE) 50 MCG/ACT nasal spray Place 1 spray into both nostrils daily. (Patient taking differently: Place 1 spray into both nostrils as needed.) 18 g 0   furosemide (LASIX) 20 MG tablet TAKE 1 TABLET BY MOUTH EVERY DAY (Patient taking differently: Take 20 mg by mouth daily.) 90 tablet 3   ibuprofen (ADVIL) 600 MG tablet Take 1 tablet (600 mg total) by mouth every 6 (six) hours as needed. 30 tablet 0   isosorbide mononitrate (IMDUR) 30 MG 24 hr tablet Take 1 tablet (30 mg total) by mouth daily. 90 tablet 3   lisinopril (ZESTRIL) 10 MG tablet TAKE 1 TABLET BY MOUTH EVERY DAY (Patient taking differently: Take 10 mg by mouth daily.) 90 tablet 3   metFORMIN (GLUCOPHAGE) 1000 MG tablet Take 1,000 mg by mouth 2 (two) times daily.   6   metoprolol succinate (TOPROL-XL) 25 MG 24 hr tablet Take 1 tablet (25 mg total) by mouth daily. 90 tablet 3   SYMBICORT 160-4.5 MCG/ACT inhaler Inhale 1 puff into the lungs 2 (two) times daily.     TRESIBA FLEXTOUCH 200 UNIT/ML FlexTouch Pen Inject 40 Units into the skin 2 (two) times daily after a meal.     No current facility-administered medications for this visit.    Patient confirms/reports the following allergies:  Allergies  Allergen Reactions   Codeine Nausea And Vomiting   Sulfa Antibiotics Rash  No orders of the defined types were placed in this encounter.   AUTHORIZATION INFORMATION Primary Insurance: [place],  [place] #: [id number],  Group #: [id number] Pre-Cert / Josem Kaufmann required: No, not required per Raul Del Pre-Cert / Auth #: Ref#: Raul [id number] 02/04/2021 3:45  SCHEDULE INFORMATION: Procedure has been scheduled as follows:  Date: 02/16/2021, Time: 1:30 Location: [facility] with Dr. Abbey Chatters  This Gastroenterology Pre-Precedure Review Form is being routed to the following provider(s): Neil Crouch, PA-C

## 2021-02-04 ENCOUNTER — Encounter: Payer: Self-pay | Admitting: *Deleted

## 2021-02-04 ENCOUNTER — Other Ambulatory Visit: Payer: Self-pay | Admitting: *Deleted

## 2021-02-04 MED ORDER — NA SULFATE-K SULFATE-MG SULF 17.5-3.13-1.6 GM/177ML PO SOLN: 1.0000 | Freq: Once | ORAL | 0 refills | Status: AC

## 2021-02-04 NOTE — Progress Notes (Signed)
Lmom for pt to call me back. 

## 2021-02-04 NOTE — Addendum Note (Signed)
Addended by: Metro Kung on: 02/04/2021 12:14 PM   Modules accepted: Orders

## 2021-02-04 NOTE — Progress Notes (Signed)
Ok to schedule. ASA 3.  Day of prep: farxiga 5mg  QD, metformin 500mg  BID, tresiba 20 units BID  AM of TCS: hold above

## 2021-02-04 NOTE — Progress Notes (Signed)
Spoke to pt.  She scheduled procedure for 02/16/2021 with arrival at 12:00.  Reviewed prep instructions by telephone.  Informed pt that I would mail out prep instructions.  Informed her of how to adjust her diabetes medications.  She voiced understanding.  She is aware that I am including a reminder letter.  She was also informed that she will need Pre-op visit.  She was informed that I will mail a Pre-op reminder letter as well.

## 2021-02-10 DIAGNOSIS — G4733 Obstructive sleep apnea (adult) (pediatric): Secondary | ICD-10-CM | POA: Diagnosis not present

## 2021-02-11 NOTE — Patient Instructions (Signed)
[name]  02/11/2021     @PREFPERIOPPHARMACY @   Your procedure is scheduled on  02/16/2021.   Report to [facility] at  1130 A.M.   Call this number if you have problems the morning of surgery:  [phone]   Remember:  Follow the diet and prep instructions given to you by the office.    DO NOT take any medications for diabetes the morning of your procedure.    Use your inhalers before you come and bring your rescue inhaler with you.    Take these medicines the morning of surgery with A SIP OF WATER                 clonazepam, lexapro, isosorbide, metoprolol.     Do not wear jewelry, make-up or nail polish.  Do not wear lotions, powders, or perfumes, or deodorant.  Do not shave 48 hours prior to surgery.  Men may shave face and neck.  Do not bring valuables to the hospital.  [facility] is not responsible for any belongings or valuables.  Contacts, dentures or bridgework may not be worn into surgery.  Leave your suitcase in the car.  After surgery it may be brought to your room.  For patients admitted to the hospital, discharge time will be determined by your treatment team.  Patients discharged the day of surgery will not be allowed to drive home and must have someone with them for 24 hours.    Special instructions:   DO NOT smoke tobacco or vape for 24 hours before your procedure.  Please read over the following fact sheets that you were given. Anesthesia Post-op Instructions and Care and Recovery After Surgery      Colonoscopy, Adult, Care After This sheet gives you information about how to care for yourself after your procedure. Your health care provider may also give you more specific instructions. If you have problems or questions, contact your health care provider. What can I expect after the procedure? After the procedure, it is common to have: A small amount of blood in your stool for 24 hours after the procedure. Some gas. Mild cramping or  bloating of your abdomen. Follow these instructions at home: Eating and drinking  Drink enough fluid to keep your urine pale yellow. Follow instructions from your health care provider about eating or drinking restrictions. Resume your normal diet as instructed by your health care provider. Avoid heavy or fried foods that are hard to digest. Activity Rest as told by your health care provider. Avoid sitting for a long time without moving. Get up to take short walks every 1-2 hours. This is important to improve blood flow and breathing. Ask for help if you feel weak or unsteady. Return to your normal activities as told by your health care provider. Ask your health care provider what activities are safe for you. Managing cramping and bloating  Try walking around when you have cramps or feel bloated. Apply heat to your abdomen as told by your health care provider. Use the heat source that your health care provider recommends, such as a moist heat pack or a heating pad. Place a towel between your skin and the heat source. Leave the heat on for 20-30 minutes. Remove the heat if your skin turns bright red. This is especially important if you are unable to feel pain, heat, or cold. You may have a greater risk of getting burned. General instructions If you were given  a sedative during the procedure, it can affect you for several hours. Do not drive or operate machinery until your health care provider says that it is safe. For the first 24 hours after the procedure: Do not sign important documents. Do not drink alcohol. Do your regular daily activities at a slower pace than normal. Eat soft foods that are easy to digest. Take over-the-counter and prescription medicines only as told by your health care provider. Keep all follow-up visits as told by your health care provider. This is important. Contact a health care provider if: You have blood in your stool 2-3 days after the procedure. Get help  right away if you have: More than a small spotting of blood in your stool. Large blood clots in your stool. Swelling of your abdomen. Nausea or vomiting. A fever. Increasing pain in your abdomen that is not relieved with medicine. Summary After the procedure, it is common to have a small amount of blood in your stool. You may also have mild cramping and bloating of your abdomen. If you were given a sedative during the procedure, it can affect you for several hours. Do not drive or operate machinery until your health care provider says that it is safe. Get help right away if you have a lot of blood in your stool, nausea or vomiting, a fever, or increased pain in your abdomen. This information is not intended to replace advice given to you by your health care provider. Make sure you discuss any questions you have with your health care provider. Document Revised: 11/17/2018 Document Reviewed: 08/07/2018 Elsevier Patient Education  [place] After This sheet gives you information about how to care for yourself after your procedure. Your health care provider may also give you more specific instructions. If you have problems or questions, contact your health care provider. What can I expect after the procedure? After the procedure, it is common to have: Tiredness. Forgetfulness about what happened after the procedure. Impaired judgment for important decisions. Nausea or vomiting. Some difficulty with balance. Follow these instructions at home: For the time period you were told by your health care provider:   Rest as needed. Do not participate in activities where you could fall or become injured. Do not drive or use machinery. Do not drink alcohol. Do not take sleeping pills or medicines that cause drowsiness. Do not make important decisions or sign legal documents. Do not take care of children on your own. Eating and drinking Follow the diet that  is recommended by your health care provider. Drink enough fluid to keep your urine pale yellow. If you vomit: Drink water, juice, or soup when you can drink without vomiting. Make sure you have little or no nausea before eating solid foods. General instructions Have a responsible adult stay with you for the time you are told. It is important to have someone help care for you until you are awake and alert. Take over-the-counter and prescription medicines only as told by your health care provider. If you have sleep apnea, surgery and certain medicines can increase your risk for breathing problems. Follow instructions from your health care provider about wearing your sleep device: Anytime you are sleeping, including during daytime naps. While taking prescription pain medicines, sleeping medicines, or medicines that make you drowsy. Avoid smoking. Keep all follow-up visits as told by your health care provider. This is important. Contact a health care provider if: You keep feeling nauseous or you keep vomiting.  You feel light-headed. You are still sleepy or having trouble with balance after 24 hours. You develop a rash. You have a fever. You have redness or swelling around the IV site. Get help right away if: You have trouble breathing. You have new-onset confusion at home. Summary For several hours after your procedure, you may feel tired. You may also be forgetful and have poor judgment. Have a responsible adult stay with you for the time you are told. It is important to have someone help care for you until you are awake and alert. Rest as told. Do not drive or operate machinery. Do not drink alcohol or take sleeping pills. Get help right away if you have trouble breathing, or if you suddenly become confused. This information is not intended to replace advice given to you by your health care provider. Make sure you discuss any questions you have with your health care provider. Document  Revised: 09/27/2019 Document Reviewed: 12/14/2018 Elsevier Patient Education  2022 [employer].

## 2021-02-12 ENCOUNTER — Encounter (HOSPITAL_COMMUNITY)
Admission: RE | Admit: 2021-02-12 | Discharge: 2021-02-12 | Disposition: A | Discharge status: Home / Self Care | Payer: BC Managed Care – PPO | Source: Ambulatory Visit | Attending: Internal Medicine | Admitting: Internal Medicine

## 2021-02-12 ENCOUNTER — Other Ambulatory Visit: Payer: Self-pay

## 2021-02-12 ENCOUNTER — Encounter (HOSPITAL_COMMUNITY): Payer: Self-pay

## 2021-02-12 VITALS — BP 173/65 | HR 80 | Temp 96.8°F | Ht 64.0 in | Wt 226.0 lb

## 2021-02-12 DIAGNOSIS — Z01812 Encounter for preprocedural laboratory examination: Secondary | ICD-10-CM | POA: Diagnosis not present

## 2021-02-12 DIAGNOSIS — E1159 Type 2 diabetes mellitus with other circulatory complications: Secondary | ICD-10-CM | POA: Diagnosis not present

## 2021-02-12 LAB — BASIC METABOLIC PANEL
Anion gap: 12 (ref 5–15)
BUN: 20 mg/dL (ref 8–23)
CO2: 27 mmol/L (ref 22–32)
Calcium: 9.3 mg/dL (ref 8.9–10.3)
Chloride: 102 mmol/L (ref 98–111)
Creatinine, Ser: 0.91 mg/dL (ref 0.44–1.00)
GFR, Estimated: >60 mL/min (ref >60)
Glucose, Bld: 163 mg/dL — ABNORMAL HIGH (ref 70–99)
Potassium: 4 mmol/L (ref 3.5–5.1)
Sodium: 141 mmol/L (ref 135–145)

## 2021-02-16 ENCOUNTER — Encounter (HOSPITAL_COMMUNITY): Payer: Self-pay

## 2021-02-16 ENCOUNTER — Encounter (HOSPITAL_COMMUNITY)
Admission: RE | Disposition: A | Discharge status: Home / Self Care | Payer: Self-pay | Source: Home / Self Care | Attending: Internal Medicine

## 2021-02-16 ENCOUNTER — Ambulatory Visit (HOSPITAL_COMMUNITY): Payer: BC Managed Care – PPO | Admitting: Anesthesiology

## 2021-02-16 ENCOUNTER — Ambulatory Visit (HOSPITAL_COMMUNITY)
Admission: RE | Admit: 2021-02-16 | Discharge: 2021-02-16 | Disposition: A | Discharge status: Home / Self Care | Payer: BC Managed Care – PPO | Attending: Internal Medicine | Admitting: Internal Medicine

## 2021-02-16 DIAGNOSIS — I10 Essential (primary) hypertension: Secondary | ICD-10-CM | POA: Insufficient documentation

## 2021-02-16 DIAGNOSIS — E119 Type 2 diabetes mellitus without complications: Secondary | ICD-10-CM | POA: Diagnosis not present

## 2021-02-16 DIAGNOSIS — K648 Other hemorrhoids: Secondary | ICD-10-CM | POA: Diagnosis not present

## 2021-02-16 DIAGNOSIS — G4730 Sleep apnea, unspecified: Secondary | ICD-10-CM | POA: Insufficient documentation

## 2021-02-16 DIAGNOSIS — I252 Old myocardial infarction: Secondary | ICD-10-CM | POA: Diagnosis not present

## 2021-02-16 DIAGNOSIS — J449 Chronic obstructive pulmonary disease, unspecified: Secondary | ICD-10-CM | POA: Diagnosis not present

## 2021-02-16 DIAGNOSIS — K5730 Diverticulosis of large intestine without perforation or abscess without bleeding: Secondary | ICD-10-CM | POA: Diagnosis not present

## 2021-02-16 DIAGNOSIS — E785 Hyperlipidemia, unspecified: Secondary | ICD-10-CM | POA: Diagnosis not present

## 2021-02-16 DIAGNOSIS — Z87891 Personal history of nicotine dependence: Secondary | ICD-10-CM | POA: Insufficient documentation

## 2021-02-16 DIAGNOSIS — I2510 Atherosclerotic heart disease of native coronary artery without angina pectoris: Secondary | ICD-10-CM | POA: Diagnosis not present

## 2021-02-16 DIAGNOSIS — Z1211 Encounter for screening for malignant neoplasm of colon: Secondary | ICD-10-CM | POA: Diagnosis not present

## 2021-02-16 DIAGNOSIS — Z139 Encounter for screening, unspecified: Secondary | ICD-10-CM | POA: Diagnosis not present

## 2021-02-16 DIAGNOSIS — F419 Anxiety disorder, unspecified: Secondary | ICD-10-CM | POA: Insufficient documentation

## 2021-02-16 DIAGNOSIS — I350 Nonrheumatic aortic (valve) stenosis: Secondary | ICD-10-CM | POA: Diagnosis not present

## 2021-02-16 DIAGNOSIS — Z6838 Body mass index (BMI) 38.0-38.9, adult: Secondary | ICD-10-CM | POA: Diagnosis not present

## 2021-02-16 DIAGNOSIS — D124 Benign neoplasm of descending colon: Secondary | ICD-10-CM | POA: Diagnosis not present

## 2021-02-16 DIAGNOSIS — K635 Polyp of colon: Secondary | ICD-10-CM | POA: Diagnosis not present

## 2021-02-16 HISTORY — PX: COLONOSCOPY WITH PROPOFOL: SHX5780

## 2021-02-16 HISTORY — PX: POLYPECTOMY: SHX5525

## 2021-02-16 LAB — GLUCOSE, CAPILLARY: Glucose-Capillary: 127 mg/dL — ABNORMAL HIGH (ref 70–99)

## 2021-02-16 SURGERY — COLONOSCOPY WITH PROPOFOL
Anesthesia: General

## 2021-02-16 MED ORDER — LIDOCAINE HCL (CARDIAC) PF 100 MG/5ML IV SOSY
PREFILLED_SYRINGE | INTRAVENOUS | Status: DC | PRN
Start: 2021-02-16 — End: 2021-02-16
  Administered 2021-02-16: 60 mg via INTRAVENOUS

## 2021-02-16 MED ORDER — LACTATED RINGERS IV SOLN: INTRAVENOUS | Status: DC

## 2021-02-16 MED ORDER — PROPOFOL 10 MG/ML IV BOLUS
INTRAVENOUS | Status: DC | PRN
  Administered 2021-02-16: 30 mg via INTRAVENOUS
  Administered 2021-02-16: 40 mg via INTRAVENOUS
  Administered 2021-02-16: 50 mg via INTRAVENOUS
  Administered 2021-02-16: 40 mg via INTRAVENOUS

## 2021-02-16 NOTE — Anesthesia Preprocedure Evaluation (Signed)
Anesthesia Evaluation  Patient identified by MRN, date of birth, ID band Patient awake    Reviewed: Allergy & Precautions, H&P , NPO status , Patient's Chart, lab work & pertinent test results, reviewed documented beta blocker date and time   History of Anesthesia Complications (+) PONV and history of anesthetic complications  Airway Mallampati: II  TM Distance: >3 FB Neck ROM: full    Dental no notable dental hx.    Pulmonary sleep apnea , COPD, Current Smoker,    Pulmonary exam normal breath sounds clear to auscultation       Cardiovascular Exercise Tolerance: Good hypertension, + CAD and + Past MI  + Valvular Problems/Murmurs (Mild) AS  Rhythm:regular Rate:Normal     Neuro/Psych Anxiety negative neurological ROS  negative psych ROS   GI/Hepatic negative GI ROS, Neg liver ROS,   Endo/Other  negative endocrine ROSdiabetes, Type 2  Renal/GU negative Renal ROS  negative genitourinary   Musculoskeletal   Abdominal   Peds  Hematology negative hematology ROS (+)   Anesthesia Other Findings   Reproductive/Obstetrics negative OB ROS                             Anesthesia Physical Anesthesia Plan  ASA: 3  Anesthesia Plan: General   Post-op Pain Management:    Induction:   PONV Risk Score and Plan: Propofol infusion  Airway Management Planned:   Additional Equipment:   Intra-op Plan:   Post-operative Plan:   Informed Consent: I have reviewed the patients History and Physical, chart, labs and discussed the procedure including the risks, benefits and alternatives for the proposed anesthesia with the patient or authorized representative who has indicated his/her understanding and acceptance.     Dental Advisory Given  Plan Discussed with: CRNA  Anesthesia Plan Comments:         Anesthesia Quick Evaluation

## 2021-02-16 NOTE — Op Note (Signed)
[facility] Patient Name: [name] Procedure Date: 02/16/2021 1:25 PM MRN: [id number] Date of Birth: 04-12-1955 Attending MD: Elon Alas. Abbey Chatters DO CSN: [id number] Age: 66 Admit Type: Outpatient Procedure:                Colonoscopy Indications:              Screening for colorectal malignant neoplasm Providers:                Elon Alas. Abbey Chatters, DO, Jessica Boudreaux, Hughie Closs, RN, Minette Headland L. Risa Grill, Technician Referring MD:              Medicines:                See the Anesthesia note for documentation of the                            administered medications Complications:            No immediate complications. Estimated Blood Loss:     Estimated blood loss was minimal. Procedure:                Pre-Anesthesia Assessment:                           - The anesthesia plan was to use monitored                            anesthesia care (MAC).                           After obtaining informed consent, the colonoscope                            was passed under direct vision. Throughout the                            procedure, the patient's blood pressure, pulse, and                            oxygen saturations were monitored continuously. The                            PCF-HQ190L ([id number]) scope was introduced through                            the anus and advanced to the the cecum, identified                            by appendiceal orifice and ileocecal valve. The                            colonoscopy was performed without difficulty. The                            patient tolerated  the procedure well. The quality                            of the bowel preparation was evaluated using the                            BBPS [facility] Bowel Preparation Scale) with scores                            of: Right Colon = 2 (minor amount of residual                            staining, small fragments of stool and/or opaque                             liquid, but mucosa seen well), Transverse Colon = 2                            (minor amount of residual staining, small fragments                            of stool and/or opaque liquid, but mucosa seen                            well) and Left Colon = 2 (minor amount of residual                            staining, small fragments of stool and/or opaque                            liquid, but mucosa seen well). The total BBPS score                            equals 6. The quality of the bowel preparation was                            fair. Scope In: 1:39:52 PM Scope Out: 1:53:15 PM Scope Withdrawal Time: 0 hours 9 minutes 37 seconds  Total Procedure Duration: 0 hours 13 minutes 23 seconds  Findings:      The perianal and digital rectal examinations were normal.      Non-bleeding internal hemorrhoids were found during endoscopy.      Multiple small and large-mouthed diverticula were found in the sigmoid       colon and descending colon.      A 4 mm polyp was found in the descending colon. The polyp was sessile.       The polyp was removed with a cold snare. Resection and retrieval were       complete.      There was a small lipoma, in the transverse colon. Impression:               - Preparation of the colon was fair.                           -  Non-bleeding internal hemorrhoids.                           - Diverticulosis in the sigmoid colon and in the                            descending colon.                           - One 4 mm polyp in the descending colon, removed                            with a cold snare. Resected and retrieved.                           - Small lipoma in the transverse colon. Moderate Sedation:      Per Anesthesia Care Recommendation:           - Patient has a contact number available for                            emergencies. The signs and symptoms of potential                            delayed complications were discussed with the                             patient. Return to normal activities tomorrow.                            Written discharge instructions were provided to the                            patient.                           - Resume previous diet.                           - Continue present medications.                           - Await pathology results.                           - Repeat colonoscopy in 5 years for surveillance.                           - Return to GI clinic PRN. Procedure Code(s):        --- Professional ---                           [phone], Colonoscopy, flexible; with removal of                            tumor(s), polyp(s), or other lesion(s) by snare  technique Diagnosis Code(s):        --- Professional ---                           Z12.11, Encounter for screening for malignant                            neoplasm of colon                           K64.8, Other hemorrhoids                           K63.5, Polyp of colon                           K57.30, Diverticulosis of large intestine without                            perforation or abscess without bleeding CPT copyright 2019 American Medical Association. All rights reserved. The codes documented in this report are preliminary and upon coder review may  be revised to meet current compliance requirements. Elon Alas. Abbey Chatters, DO [place] Abbey Chatters, DO 02/16/2021 1:56:38 PM This report has been signed electronically. Number of Addenda: 0

## 2021-02-16 NOTE — H&P (Signed)
Primary Care Physician:  Monico Blitz, MD Primary Gastroenterologist:  Dr. Abbey Chatters  Pre-Procedure History & Physical: HPI:  [name] is a 66 y.o. female is here for a colonoscopy for colon cancer screening purposes.  Patient denies any family history of colorectal cancer.  No melena or hematochezia.  No abdominal pain or unintentional weight loss.  No change in bowel habits.  Overall feels well from a GI standpoint.  Past Medical History:  Diagnosis Date   Anxiety    Aortic stenosis    CAD (coronary artery disease)    a. 10/2014: Ant STEMI s/p DES to dLAD   Carotid disease, bilateral ([place])    a. Duplex 12/2014: mild-mod atherosclerotic plaque without hemodynamically significant stenosis.   COPD (chronic obstructive pulmonary disease) (HCC)    Essential hypertension    HLD (hyperlipidemia)    Morbid obesity (HCC)    Myocardial infarction ([place]) 11/17/2014   PONV (postoperative nausea and vomiting)    Shingles    Sleep apnea    Uses CPAP   Type 2 diabetes mellitus [facility])     Past Surgical History:  Procedure Laterality Date   ABDOMINAL HYSTERECTOMY     BREAST BIOPSY Left    2016   CARDIAC CATHETERIZATION N/A 11/17/2014   Procedure: Left Heart Cath and Coronary Angiography;  Surgeon: Wellington Hampshire, MD;  Location: [place] [facility];  Service: Cardiovascular;  Laterality: N/A;   CARDIAC CATHETERIZATION N/A 11/17/2014   Procedure: Coronary Stent Intervention;  Surgeon: Wellington Hampshire, MD;  Location: [place] [facility];  Service: Cardiovascular;  Laterality: N/A;  distal lad 2.25x20 promus   CATARACT EXTRACTION W/PHACO Left 09/20/2016   Procedure: CATARACT EXTRACTION PHACO AND INTRAOCULAR LENS PLACEMENT (IOC);  Surgeon: Tonny Branch, MD;  Location: [facility];  Service: Ophthalmology;  Laterality: Left;  CDE: 9.27   CATARACT EXTRACTION W/PHACO Right 10/18/2016   Procedure: CATARACT EXTRACTION PHACO AND INTRAOCULAR LENS PLACEMENT (IOC);  Surgeon: Tonny Branch, MD;  Location: [facility];   Service: Ophthalmology;  Laterality: Right;  CDE: 6.38   CHOLECYSTECTOMY     SPINAL FUSION     cervical; screws and plates.    Prior to Admission medications   Medication Sig Start Date End Date Taking? Authorizing Provider  acetaminophen (TYLENOL) 500 MG tablet Take 500 mg by mouth every 6 (six) hours as needed for moderate pain or headache.   Yes [provider]  albuterol (VENTOLIN HFA) 108 (90 Base) MCG/ACT inhaler Inhale 1-2 puffs into the lungs every 4 (four) hours as needed for wheezing or shortness of breath. 11/13/20  Yes Melynda Ripple, MD  aspirin EC 81 MG tablet Take 81 mg by mouth daily.   Yes [provider]  atorvastatin (LIPITOR) 10 MG tablet TAKE 1 TABLET (10 MG TOTAL) BY MOUTH EVERY OTHER DAY. TAKE AT [facility] Patient taking differently: Take 10 mg by mouth at bedtime. 09/25/18 02/10/21 Yes Satira Sark, MD  clonazePAM (KLONOPIN) 0.5 MG tablet Take 0.5 mg by mouth daily as needed for anxiety. 12/29/20  Yes [provider]  dapagliflozin propanediol (FARXIGA) 10 MG TABS tablet Take 10 mg by mouth daily.   Yes [provider]  escitalopram (LEXAPRO) 20 MG tablet Take 20 mg by mouth daily at 6 (six) AM.   Yes [provider]  fluconazole (DIFLUCAN) 150 MG tablet Take 150 mg by mouth daily as needed (skin infection).   Yes [provider]  fluticasone (FLONASE) 50 MCG/ACT nasal spray Place 1 spray into both  nostrils daily. Patient taking differently: Place 1 spray into both nostrils 2 (two) times daily. 10/24/20  Yes Lamptey, Myrene Galas, MD  furosemide (LASIX) 20 MG tablet TAKE 1 TABLET BY MOUTH EVERY DAY Patient taking differently: Take 20 mg by mouth daily. 06/24/20  Yes Satira Sark, MD  ibuprofen (ADVIL) 600 MG tablet Take 1 tablet (600 mg total) by mouth every 6 (six) hours as needed. Patient taking differently: Take 600 mg by mouth every 6 (six) hours as needed for headache. 11/13/20  Yes Melynda Ripple, MD   isosorbide mononitrate (IMDUR) 30 MG 24 hr tablet Take 1 tablet (30 mg total) by mouth daily. 09/12/20  Yes Satira Sark, MD  ketoconazole (NIZORAL) 2 % cream Apply 1 application topically daily as needed for rash. 01/23/21  Yes [provider]  lisinopril (ZESTRIL) 10 MG tablet TAKE 1 TABLET BY MOUTH EVERY DAY Patient taking differently: Take 10 mg by mouth daily. 05/21/20  Yes Satira Sark, MD  metFORMIN (GLUCOPHAGE) 1000 MG tablet Take 1,000 mg by mouth 2 (two) times daily.  09/26/14  Yes [provider]  metoprolol succinate (TOPROL-XL) 25 MG 24 hr tablet Take 1 tablet (25 mg total) by mouth daily. 09/12/20  Yes Satira Sark, MD  NOVOLOG FLEXPEN 100 UNIT/ML FlexPen Inject 10 Units into the skin 2 (two) times daily. 01/28/21  Yes [provider]  nystatin (MYCOSTATIN/NYSTOP) powder Apply 1 application topically daily as needed for rash. 01/23/21  Yes [provider]  SYMBICORT 160-4.5 MCG/ACT inhaler Inhale 2 puffs into the lungs daily. 08/18/20  Yes [provider]  TRESIBA FLEXTOUCH 200 UNIT/ML FlexTouch Pen Inject 40 Units into the skin 2 (two) times daily after a meal. 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  Tobacco Use   Smoking status: Some Days    Packs/day: 0.50    Years: 30.00    Pack years: 15.00    Types: Cigarettes    Start date: 12/26/1972   Smokeless tobacco:  Never   Tobacco comments:    3 cigs per day  Vaping Use   Vaping Use: Never used  Substance and Sexual Activity   Alcohol use: No    Alcohol/week: 0.0 standard drinks   Drug use: No   Sexual activity: Yes    Partners: Male    Birth control/protection: Surgical  Other Topics Concern   Not on file  Social History Narrative   Not on file   Social Determinants of Health   Financial Resource Strain: Not on file  Food Insecurity: Not on file  Transportation Needs: Not on file  Physical Activity: Not on file  Stress: Not on file  Social Connections: Not on file  Intimate Partner Violence: Not on file    Review of Systems: See HPI, otherwise negative ROS  Physical Exam: Vital signs in last  24 hours: Temp:  [98.2 F (36.8 C)] 98.2 F (36.8 C) (01/23 1216) Pulse Rate:  [72] 72 (01/23 1216) Resp:  [16] 16 (01/23 1216) BP: (138)/(48) 138/48 (01/23 1216) SpO2:  [94 %] 94 % (01/23 1216)   General:   Alert,  Well-developed, well-nourished, pleasant and cooperative in NAD Head:  Normocephalic and atraumatic. Eyes:  Sclera clear, no icterus.   Conjunctiva pink. Ears:  Normal auditory acuity. Nose:  No deformity, discharge,  or lesions. Mouth:  No deformity or lesions, dentition normal. Neck:  Supple; no masses or thyromegaly. Lungs:  Clear throughout to auscultation.   No wheezes, crackles, or rhonchi. No acute distress. Heart:  Regular rate and rhythm; no murmurs, clicks, rubs,  or gallops. Abdomen:  Soft, nontender and nondistended. No masses, hepatosplenomegaly or hernias noted. Normal bowel sounds, without guarding, and without rebound.   Msk:  Symmetrical without gross deformities. Normal posture. Extremities:  Without clubbing or edema. Neurologic:  Alert and  oriented x4;  grossly normal neurologically. Skin:  Intact without significant lesions or rashes. Cervical Nodes:  No significant cervical adenopathy. Psych:  Alert and cooperative. Normal mood and  affect.  Impression/Plan: [name] is here for a colonoscopy to be performed for colon cancer screening purposes.  The risks of the procedure including infection, bleed, or perforation as well as benefits, limitations, alternatives and imponderables have been reviewed with the patient. Questions have been answered. All parties agreeable.

## 2021-02-16 NOTE — Progress Notes (Signed)
Please excuse [name] from work on Monday January 23, and Tuesday February 17, 2021.  She may not drive, operate machinery, or sign legal documents until after 2:00pm on Tuesday February 17, 2021.

## 2021-02-16 NOTE — Transfer of Care (Signed)
Immediate Anesthesia Transfer of Care Note  Patient: [name]  Procedure(s) Performed: COLONOSCOPY WITH PROPOFOL POLYPECTOMY  Patient Location: PACU  Anesthesia Type:General  Level of Consciousness: awake, alert  and oriented  Airway & Oxygen Therapy: Patient Spontanous Breathing  Post-op Assessment: Report given to RN and Post -op Vital signs reviewed and stable  Post vital signs: Reviewed and stable  Last Vitals:  Vitals Value Taken Time  BP    Temp    Pulse    Resp    SpO2      Last Pain:  Vitals:   02/16/21 1335  PainSc: 0-No pain         Complications: No notable events documented.

## 2021-02-16 NOTE — Discharge Instructions (Addendum)
°  Colonoscopy Discharge Instructions  Read the instructions outlined below and refer to this sheet in the next few weeks. These discharge instructions provide you with general information on caring for yourself after you leave the hospital. Your doctor may also give you specific instructions. While your treatment has been planned according to the most current medical practices available, unavoidable complications occasionally occur.   ACTIVITY You may resume your regular activity, but move at a slower pace for the next 24 hours.  Take frequent rest periods for the next 24 hours.  Walking will help get rid of the air and reduce the bloated feeling in your belly (abdomen).  No driving for 24 hours (because of the medicine (anesthesia) used during the test).   Do not sign any important legal documents or operate any machinery for 24 hours (because of the anesthesia used during the test).  NUTRITION Drink plenty of fluids.  You may resume your normal diet as instructed by your doctor.  Begin with a light meal and progress to your normal diet. Heavy or fried foods are harder to digest and may make you feel sick to your stomach (nauseated).  Avoid alcoholic beverages for 24 hours or as instructed.  MEDICATIONS You may resume your normal medications unless your doctor tells you otherwise.  WHAT YOU CAN EXPECT TODAY Some feelings of bloating in the abdomen.  Passage of more gas than usual.  Spotting of blood in your stool or on the toilet paper.  IF YOU HAD POLYPS REMOVED DURING THE COLONOSCOPY: No aspirin products for 7 days or as instructed.  No alcohol for 7 days or as instructed.  Eat a soft diet for the next 24 hours.  FINDING OUT THE RESULTS OF YOUR TEST Not all test results are available during your visit. If your test results are not back during the visit, make an appointment with your caregiver to find out the results. Do not assume everything is normal if you have not heard from your  caregiver or the medical facility. It is important for you to follow up on all of your test results.  SEEK IMMEDIATE MEDICAL ATTENTION IF: You have more than a spotting of blood in your stool.  Your belly is swollen (abdominal distention).  You are nauseated or vomiting.  You have a temperature over 101.  You have abdominal pain or discomfort that is severe or gets worse throughout the day.   Your colonoscopy revealed 1 polyp(s) which I removed successfully. Await pathology results, my office will contact you. I recommend repeating colonoscopy in 5 years for surveillance purposes.   You also have diverticulosis and internal hemorrhoids. I would recommend increasing fiber in your diet or adding OTC Benefiber/Metamucil. Be sure to drink at least 4 to 6 glasses of water daily. Follow-up with GI as needed.   I hope you have a great rest of your week!  Charles K. Carver, D.O. Gastroenterology and Hepatology [facility]  

## 2021-02-16 NOTE — Anesthesia Postprocedure Evaluation (Signed)
Anesthesia Post Note  Patient: [name]  Procedure(s) Performed: COLONOSCOPY WITH PROPOFOL POLYPECTOMY  Patient location during evaluation: Phase II Anesthesia Type: General Level of consciousness: awake Pain management: pain level controlled Vital Signs Assessment: post-procedure vital signs reviewed and stable Respiratory status: spontaneous breathing and respiratory function stable Cardiovascular status: blood pressure returned to baseline and stable Postop Assessment: no headache and no apparent nausea or vomiting Anesthetic complications: no Comments: Late entry   No notable events documented.   Last Vitals:  Vitals:   02/16/21 1216 02/16/21 1358  BP: (!) 138/48 (!) 103/56  Pulse: 72 61  Resp: 16 18  Temp: 36.8 C 36.7 C  SpO2: 94% 96%    Last Pain:  Vitals:   02/16/21 1358  TempSrc: Oral  PainSc: 0-No pain                 Louann Sjogren

## 2021-02-17 ENCOUNTER — Telehealth: Payer: BC Managed Care – PPO | Admitting: Adult Health

## 2021-02-18 ENCOUNTER — Encounter (HOSPITAL_COMMUNITY): Payer: Self-pay | Admitting: Internal Medicine

## 2021-02-18 LAB — SURGICAL PATHOLOGY

## 2021-03-04 ENCOUNTER — Other Ambulatory Visit: Payer: Self-pay

## 2021-03-04 ENCOUNTER — Ambulatory Visit (INDEPENDENT_AMBULATORY_CARE_PROVIDER_SITE_OTHER): Payer: BC Managed Care – PPO | Admitting: Cardiology

## 2021-03-04 ENCOUNTER — Encounter: Payer: Self-pay | Admitting: Cardiology

## 2021-03-04 VITALS — BP 138/66 | HR 76 | Ht 64.0 in | Wt 217.0 lb

## 2021-03-04 DIAGNOSIS — I25119 Atherosclerotic heart disease of native coronary artery with unspecified angina pectoris: Secondary | ICD-10-CM

## 2021-03-04 DIAGNOSIS — I350 Nonrheumatic aortic (valve) stenosis: Secondary | ICD-10-CM | POA: Diagnosis not present

## 2021-03-04 NOTE — Progress Notes (Signed)
Cardiology Office Note  Date: 03/04/2021   ID: [name] 1955-04-01, MRN [id number]  PCP:  Monico Blitz, MD  Cardiologist:  Rozann Lesches, MD Electrophysiologist:  None   Chief Complaint  Patient presents with   Cardiac follow-up    History of Present Illness: [name] is a 66 y.o. female last seen in August 2022.  She is here for a routine visit.  She does not report any active angina at this time on medical therapy.  She enjoys walking for exercise.  NYHA class II dyspnea, no palpitations or syncope.  I reviewed her medications which are outlined below.  She just had lab work obtained recently by PCP, we are requesting the results for review.  Past Medical History:  Diagnosis Date   Anxiety    Aortic stenosis    CAD (coronary artery disease)    a. 10/2014: Ant STEMI s/p DES to dLAD   Carotid disease, bilateral ([place])    a. Duplex 12/2014: mild-mod atherosclerotic plaque without hemodynamically significant stenosis.   COPD (chronic obstructive pulmonary disease) (HCC)    Essential hypertension    HLD (hyperlipidemia)    Morbid obesity (HCC)    Myocardial infarction ([place]) 11/17/2014   PONV (postoperative nausea and vomiting)    Shingles    Sleep apnea    Uses CPAP   Type 2 diabetes mellitus [facility])     Past Surgical History:  Procedure Laterality Date   ABDOMINAL HYSTERECTOMY     BREAST BIOPSY Left    2016   CARDIAC CATHETERIZATION N/A 11/17/2014   Procedure: Left Heart Cath and Coronary Angiography;  Surgeon: Wellington Hampshire, MD;  Location: [place] [facility];  Service: Cardiovascular;  Laterality: N/A;   CARDIAC CATHETERIZATION N/A 11/17/2014   Procedure: Coronary Stent Intervention;  Surgeon: Wellington Hampshire, MD;  Location: [place] [facility];  Service: Cardiovascular;  Laterality: N/A;  distal lad 2.25x20 promus   CATARACT EXTRACTION W/PHACO Left 09/20/2016   Procedure: CATARACT EXTRACTION PHACO AND INTRAOCULAR LENS PLACEMENT (IOC);  Surgeon:  Tonny Branch, MD;  Location: [facility];  Service: Ophthalmology;  Laterality: Left;  CDE: 9.27   CATARACT EXTRACTION W/PHACO Right 10/18/2016   Procedure: CATARACT EXTRACTION PHACO AND INTRAOCULAR LENS PLACEMENT (IOC);  Surgeon: Tonny Branch, MD;  Location: [facility];  Service: Ophthalmology;  Laterality: Right;  CDE: 6.38   CHOLECYSTECTOMY     COLONOSCOPY WITH PROPOFOL N/A 02/16/2021   Procedure: COLONOSCOPY WITH PROPOFOL;  Surgeon: Eloise Harman, DO;  [facility];  Service: Endoscopy;  Laterality: N/A;  1:30 / ASA 3   POLYPECTOMY  02/16/2021   Procedure: POLYPECTOMY;  Surgeon: Eloise Harman, DO;  [facility];  Service: Endoscopy;;   SPINAL FUSION     cervical; screws and plates.    Current Outpatient Medications  Medication Sig Dispense Refill   acetaminophen (TYLENOL) 500 MG tablet Take 500 mg by mouth every 6 (six) hours as needed for moderate pain or headache.     albuterol (VENTOLIN HFA) 108 (90 Base) MCG/ACT inhaler Inhale 1-2 puffs into the lungs every 4 (four) hours as needed for wheezing or shortness of breath. 1 each 0   aspirin EC 81 MG tablet Take 81 mg by mouth daily.     atorvastatin (LIPITOR) 10 MG tablet TAKE 1 TABLET (10 MG TOTAL) BY MOUTH EVERY OTHER DAY. TAKE AT DINNER (Patient taking differently: Take 10 mg by mouth every other day.) 45 tablet 3  clonazePAM (KLONOPIN) 0.5 MG tablet Take 0.5 mg by mouth daily as needed for anxiety.     dapagliflozin propanediol (FARXIGA) 10 MG TABS tablet Take 10 mg by mouth daily.     escitalopram (LEXAPRO) 20 MG tablet Take 20 mg by mouth daily at 6 (six) AM.     fluconazole (DIFLUCAN) 150 MG tablet Take 150 mg by mouth daily as needed (skin infection).     fluticasone (FLONASE) 50 MCG/ACT nasal spray Place 1 spray into both nostrils daily. (Patient taking differently: Place 1 spray into both nostrils 2 (two) times daily.) 18 g 0   furosemide (LASIX) 20 MG tablet TAKE 1 TABLET BY MOUTH EVERY DAY (Patient taking  differently: Take 20 mg by mouth daily.) 90 tablet 3   ibuprofen (ADVIL) 600 MG tablet Take 1 tablet (600 mg total) by mouth every 6 (six) hours as needed. (Patient taking differently: Take 600 mg by mouth every 6 (six) hours as needed for headache.) 30 tablet 0   isosorbide mononitrate (IMDUR) 30 MG 24 hr tablet Take 1 tablet (30 mg total) by mouth daily. 90 tablet 3   ketoconazole (NIZORAL) 2 % cream Apply 1 application topically daily as needed for rash.     lisinopril (ZESTRIL) 10 MG tablet TAKE 1 TABLET BY MOUTH EVERY DAY (Patient taking differently: Take 10 mg by mouth daily.) 90 tablet 3   metFORMIN (GLUCOPHAGE) 1000 MG tablet Take 1,000 mg by mouth 2 (two) times daily.   6   metoprolol succinate (TOPROL-XL) 25 MG 24 hr tablet Take 1 tablet (25 mg total) by mouth daily. 90 tablet 3   NOVOLOG FLEXPEN 100 UNIT/ML FlexPen Inject 10 Units into the skin 2 (two) times daily.     nystatin (MYCOSTATIN/NYSTOP) powder Apply 1 application topically daily as needed for rash.     SYMBICORT 160-4.5 MCG/ACT inhaler Inhale 2 puffs into the lungs daily.     TRESIBA FLEXTOUCH 200 UNIT/ML FlexTouch Pen Inject 40 Units into the skin 2 (two) times daily after a meal.     No current facility-administered medications for this visit.   Allergies:  Codeine and Sulfa antibiotics   ROS: No orthopnea or PND.  Physical Exam: VS:  BP 138/66    Pulse 76    Ht 5\' 4"  (1.626 m)    Wt 217 lb (98.4 kg)    SpO2 91%    BMI 37.25 kg/m , BMI Body mass index is 37.25 kg/m.  Wt Readings from Last 3 Encounters:  03/04/21 217 lb (98.4 kg)  02/12/21 226 lb (102.5 kg)  01/29/21 226 lb 9.6 oz (102.8 kg)    General: Patient appears comfortable at rest. HEENT: Conjunctiva and lids normal, wearing a mask. Neck: Supple, no elevated JVP or carotid bruits, no thyromegaly. Lungs: Clear to auscultation, nonlabored breathing at rest. Cardiac: Regular rate and rhythm, no S3, 2/6 systolic murmur, no pericardial rub. Extremities: No  pitting edema.  ECG:  An ECG dated 07/16/2020 was personally reviewed today and demonstrated:  Sinus rhythm with rightward axis, nonspecific T wave changes.  Recent Labwork: 07/16/2020: ALT 21; AST 19; B Natriuretic Peptide 182.0; Hemoglobin 14.0; Platelets 268 02/12/2021: BUN 20; Creatinine, Ser 0.91; Potassium 4.0; Sodium 141   Other Studies Reviewed Today:  Echocardiogram January 2002 [facility]): LVEF of approximately 60%, mildly dilated left atrium, normal RV contraction, calcified aortic valve with mild aortic stenosis and mild aortic regurgitation, peak and mean AV gradient of 19 and 9 mmHg respectively, trace mitral regurgitation, mild  to moderate tricuspid regurgitation, and RVSP estimated 51 mmHg.  No pericardial effusion.  Assessment and Plan:  1.  CAD status post anterior STEMI with DES to the LAD in 2018.  She is doing well at this point in terms of angina control on current medical regimen.  Continue observation on aspirin, Farxiga, lisinopril, Toprol-XL, Imdur, and Lipitor.  Requesting interval lab work from PCP for review.  2.  Mild aortic stenosis with mild aortic regurgitation by echocardiogram in January of last year.  She remains asymptomatic.  Medication Adjustments/Labs and Tests Ordered: Current medicines are reviewed at length with the patient today.  Concerns regarding medicines are outlined above.   Tests Ordered: No orders of the defined types were placed in this encounter.   Medication Changes: No orders of the defined types were placed in this encounter.   Disposition:  Follow up  6 months.  Signed, Satira Sark, MD, [facility] 03/04/2021 2:52 PM    [place] [facility] [address] [place], [place] [address] Phone: [phone]; Fax: [phone]

## 2021-03-04 NOTE — Patient Instructions (Signed)
Medication Instructions:  Your physician recommends that you continue on your current medications as directed. Please refer to the Current Medication list given to you today.   Labwork: None today  Testing/Procedures: None today  Follow-Up: 6 months  Any Other Special Instructions Will Be Listed Below (If Applicable).  If you need a refill on your cardiac medications before your next appointment, please call your pharmacy.  

## 2021-04-09 DIAGNOSIS — E1151 Type 2 diabetes mellitus with diabetic peripheral angiopathy without gangrene: Secondary | ICD-10-CM | POA: Diagnosis not present

## 2021-04-09 DIAGNOSIS — Z6839 Body mass index (BMI) 39.0-39.9, adult: Secondary | ICD-10-CM | POA: Diagnosis not present

## 2021-04-09 DIAGNOSIS — I10 Essential (primary) hypertension: Secondary | ICD-10-CM | POA: Diagnosis not present

## 2021-04-09 DIAGNOSIS — Z299 Encounter for prophylactic measures, unspecified: Secondary | ICD-10-CM | POA: Diagnosis not present

## 2021-04-09 DIAGNOSIS — I509 Heart failure, unspecified: Secondary | ICD-10-CM | POA: Diagnosis not present

## 2021-04-09 DIAGNOSIS — F17210 Nicotine dependence, cigarettes, uncomplicated: Secondary | ICD-10-CM | POA: Diagnosis not present

## 2021-04-09 DIAGNOSIS — E1165 Type 2 diabetes mellitus with hyperglycemia: Secondary | ICD-10-CM | POA: Diagnosis not present

## 2021-04-13 DIAGNOSIS — I739 Peripheral vascular disease, unspecified: Secondary | ICD-10-CM | POA: Diagnosis not present

## 2021-04-13 DIAGNOSIS — I700 Atherosclerosis of aorta: Secondary | ICD-10-CM | POA: Diagnosis not present

## 2021-04-15 DIAGNOSIS — F17210 Nicotine dependence, cigarettes, uncomplicated: Secondary | ICD-10-CM | POA: Diagnosis not present

## 2021-04-15 DIAGNOSIS — I739 Peripheral vascular disease, unspecified: Secondary | ICD-10-CM | POA: Diagnosis not present

## 2021-04-15 DIAGNOSIS — Z6839 Body mass index (BMI) 39.0-39.9, adult: Secondary | ICD-10-CM | POA: Diagnosis not present

## 2021-04-15 DIAGNOSIS — I5032 Chronic diastolic (congestive) heart failure: Secondary | ICD-10-CM | POA: Diagnosis not present

## 2021-04-15 DIAGNOSIS — E1165 Type 2 diabetes mellitus with hyperglycemia: Secondary | ICD-10-CM | POA: Diagnosis not present

## 2021-04-15 DIAGNOSIS — Z299 Encounter for prophylactic measures, unspecified: Secondary | ICD-10-CM | POA: Diagnosis not present

## 2021-04-26 ENCOUNTER — Other Ambulatory Visit: Payer: Self-pay | Admitting: Cardiology

## 2021-05-16 IMAGING — CR PORTABLE CHEST - 1 VIEW
1 series · 2 of 2 positions shown · non-contrast
Comparison: 04/04/2018

CLINICAL DATA: Shortness of breath

EXAM:
PORTABLE CHEST 1 VIEW

[Series 1: portable · 0.17mm/px · 2 of 2 slices shown]
[im 1/2]
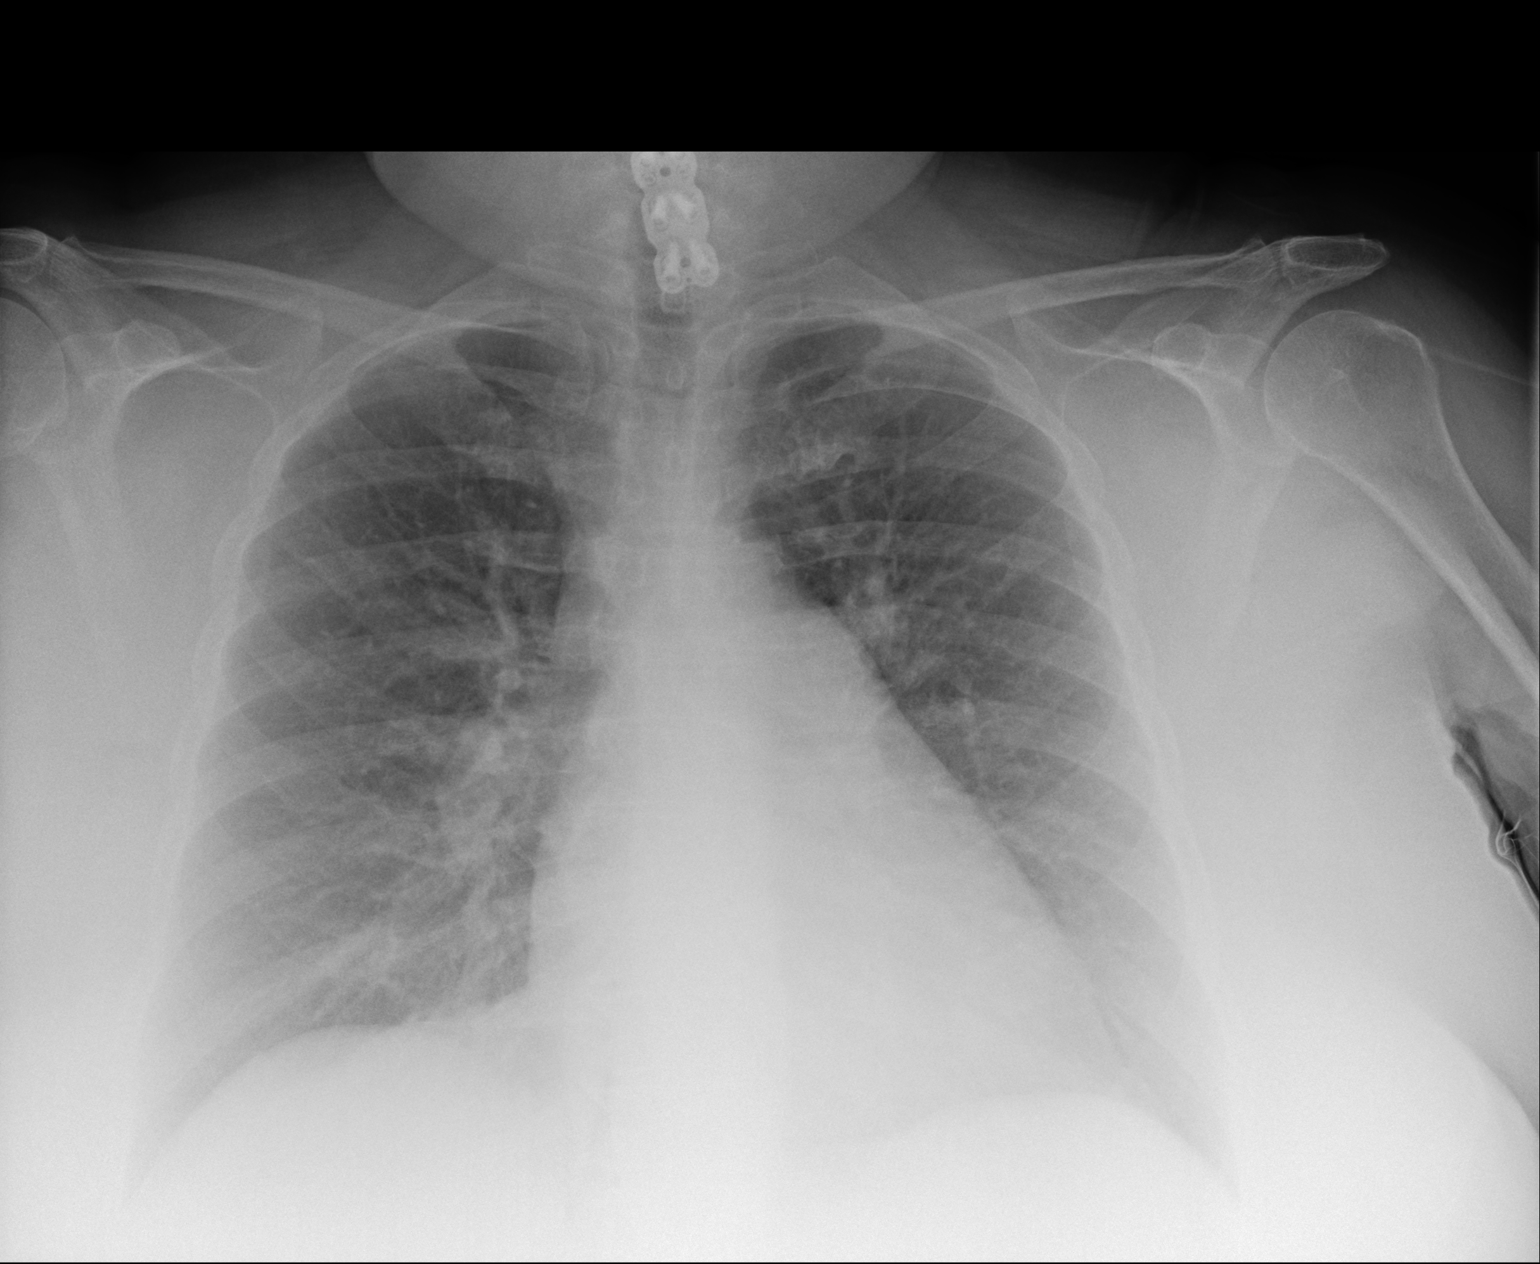
[im 2/2]
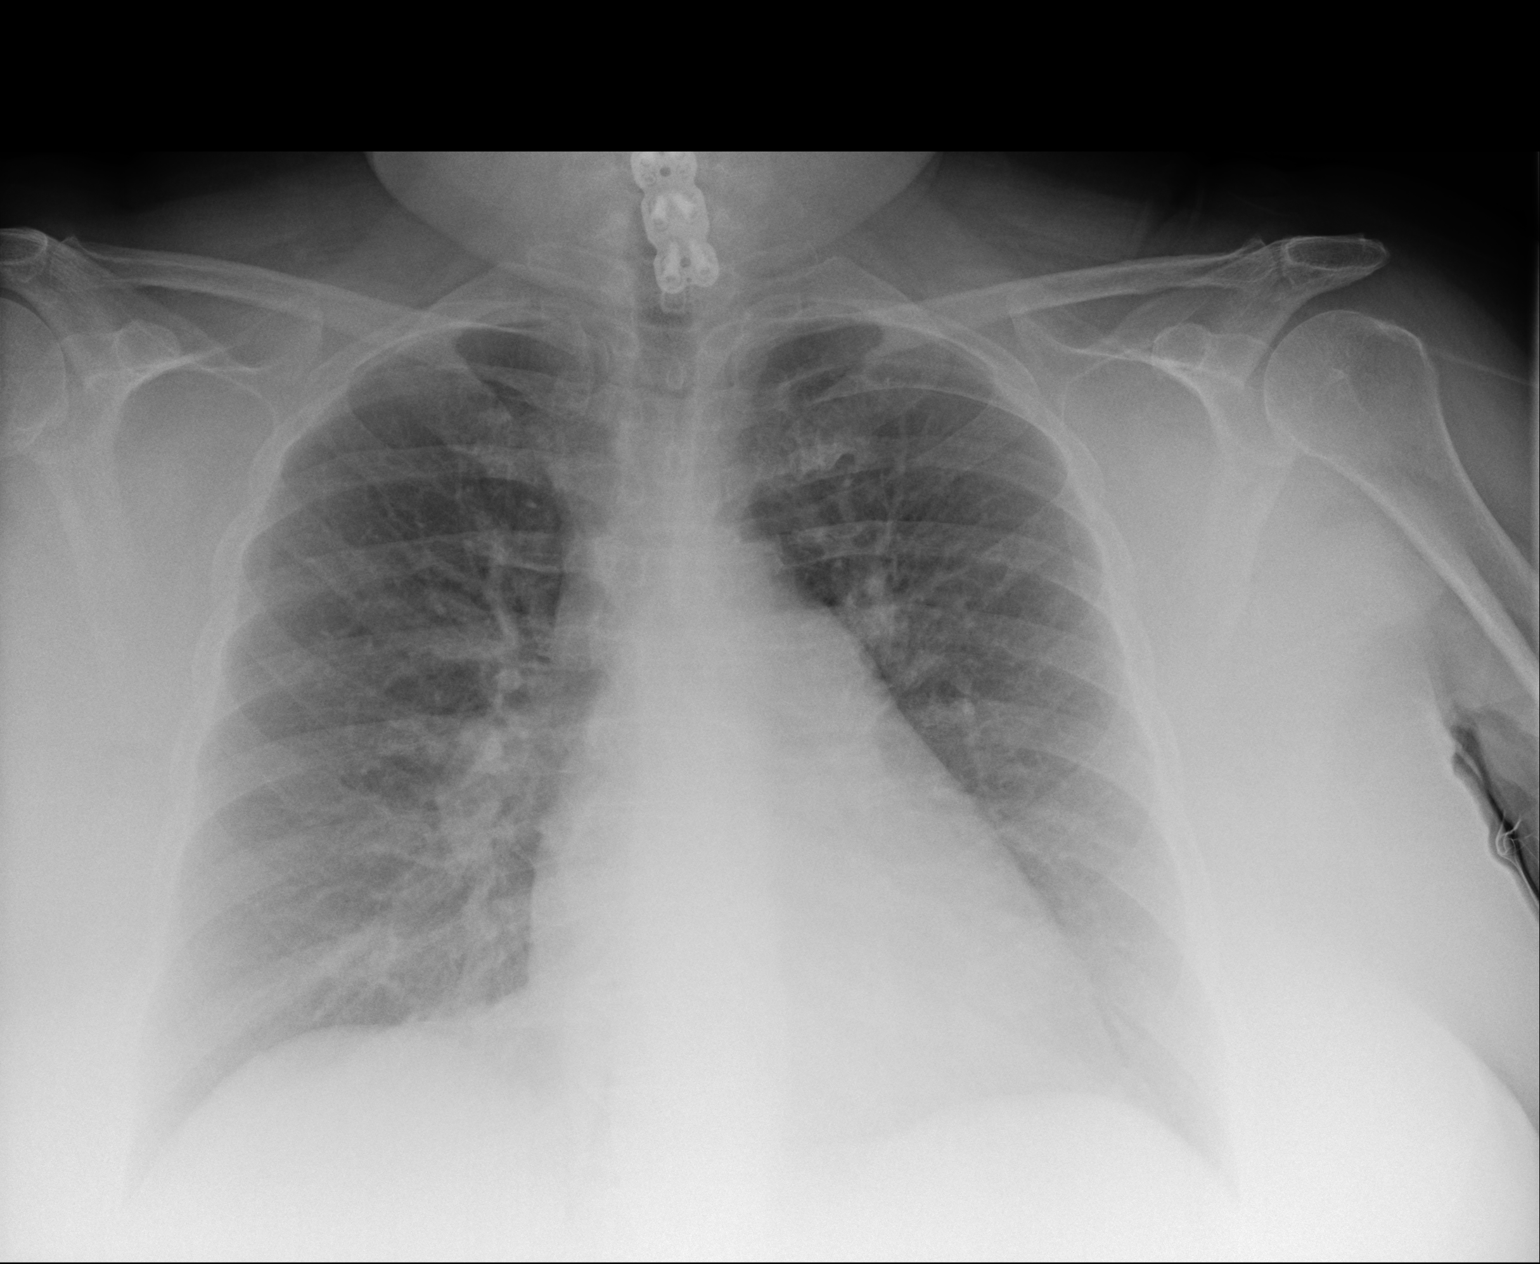

[2 of 2 positions shown; findings below may reference images not displayed]

FINDINGS: Chronic interstitial coarsening. There is no edema, consolidation,
effusion, or pneumothorax. Borderline heart size that is stable ACDF
hardware.
IMPRESSION: No acute finding when compared to priors.

## 2021-05-20 ENCOUNTER — Other Ambulatory Visit: Payer: Self-pay

## 2021-05-20 DIAGNOSIS — I739 Peripheral vascular disease, unspecified: Secondary | ICD-10-CM

## 2021-05-28 ENCOUNTER — Other Ambulatory Visit: Payer: Self-pay | Admitting: Cardiology

## 2021-06-03 ENCOUNTER — Ambulatory Visit (HOSPITAL_COMMUNITY)
Admission: RE | Admit: 2021-06-03 | Discharge: 2021-06-03 | Disposition: A | Discharge status: Home / Self Care | Payer: BC Managed Care – PPO | Source: Ambulatory Visit | Attending: Vascular Surgery | Admitting: Vascular Surgery

## 2021-06-03 ENCOUNTER — Ambulatory Visit (INDEPENDENT_AMBULATORY_CARE_PROVIDER_SITE_OTHER): Payer: BC Managed Care – PPO | Admitting: Vascular Surgery

## 2021-06-03 ENCOUNTER — Encounter: Payer: Self-pay | Admitting: Vascular Surgery

## 2021-06-03 VITALS — BP 182/81 | HR 68 | Temp 97.9°F | Resp 20 | Ht 64.0 in | Wt 218.0 lb

## 2021-06-03 DIAGNOSIS — M25561 Pain in right knee: Secondary | ICD-10-CM

## 2021-06-03 DIAGNOSIS — I739 Peripheral vascular disease, unspecified: Secondary | ICD-10-CM | POA: Diagnosis not present

## 2021-06-03 DIAGNOSIS — M25562 Pain in left knee: Secondary | ICD-10-CM | POA: Diagnosis not present

## 2021-06-03 NOTE — Progress Notes (Signed)
? ?Patient ID: [name], female   DOB: 07-Apr-1955, 66 y.o.   MRN: [id number] ? ?Reason for Consult: New Patient (Initial Visit) ?  ?Referred by Monico Blitz, MD ? ?Subjective:  ?   ?HPI: ? ?[name] is a 66 y.o. female with left greater than right lower extremity pain.  She states this is really in her posterior leg thigh is nonradiating.  She does have swelling of the left greater than right leg denies any previous history of DVT.  She is able to walk without any tissue loss or ulceration.  She is a chronic every day smoker.  She is also diabetic.  She is on aspirin and Lipitor.  She denies any personal or family history of aneurysm disease or stroke.  She does have a history of coronary artery disease with treatment of a stemi over 5 years ago. ? ?Past Medical History:  ?Diagnosis Date  ? Anxiety   ? Aortic stenosis   ? CAD (coronary artery disease)   ? a. 10/2014: Ant STEMI s/p DES to dLAD  ? Carotid disease, bilateral ([place])   ? a. Duplex 12/2014: mild-mod atherosclerotic plaque without hemodynamically significant stenosis.  ? COPD (chronic obstructive pulmonary disease) ([place])   ? Essential hypertension   ? HLD (hyperlipidemia)   ? Morbid obesity ([place])   ? Myocardial infarction ([place]) 11/17/2014  ? PONV (postoperative nausea and vomiting)   ? Shingles   ? Sleep apnea   ? Uses CPAP  ? Type 2 diabetes mellitus ([place])   ? ?Family History  ?Problem Relation Age of [place]  ? Heart attack Father   ? Heart disease Father   ? Cancer Brother   ? ?Past Surgical History:  ?Procedure Laterality Date  ? ABDOMINAL HYSTERECTOMY    ? BREAST BIOPSY Left   ? 2016  ? CARDIAC CATHETERIZATION N/A 11/17/2014  ? Procedure: Left Heart Cath and Coronary Angiography;  Surgeon: Wellington Hampshire, MD;  Location: [place] [facility];  Service: Cardiovascular;  Laterality: N/A;  ? CARDIAC CATHETERIZATION N/A 11/17/2014  ? Procedure: Coronary Stent Intervention;  Surgeon: Wellington Hampshire, MD;  Location: [place] [facility];  Service:  Cardiovascular;  Laterality: N/A;  distal lad 2.25x20 promus  ? CATARACT EXTRACTION W/PHACO Left 09/20/2016  ? Procedure: CATARACT EXTRACTION PHACO AND INTRAOCULAR LENS PLACEMENT (IOC);  Surgeon: Tonny Branch, MD;  Location: [facility];  Service: Ophthalmology;  Laterality: Left;  CDE: 9.27  ? CATARACT EXTRACTION W/PHACO Right 10/18/2016  ? Procedure: CATARACT EXTRACTION PHACO AND INTRAOCULAR LENS PLACEMENT (IOC);  Surgeon: Tonny Branch, MD;  Location: [facility];  Service: Ophthalmology;  Laterality: Right;  CDE: 6.38  ? CHOLECYSTECTOMY    ? COLONOSCOPY WITH PROPOFOL N/A 02/16/2021  ? Procedure: COLONOSCOPY WITH PROPOFOL;  Surgeon: Eloise Harman, DO;  [facility];  Service: Endoscopy;  Laterality: N/A;  1:30 / ASA 3  ? POLYPECTOMY  02/16/2021  ? Procedure: POLYPECTOMY;  Surgeon: Eloise Harman, DO;  [facility];  Service: Endoscopy;;  ? SPINAL FUSION    ? cervical; screws and plates.  ? ? ?Short Social History:  ?Social History  ? ?Tobacco Use  ? Smoking status: Every Day  ?  Packs/day: 0.50  ?  Years: 30.00  ?  Pack years: 15.00  ?  Types: Cigarettes  ?  Start date: 12/26/1972  ? Smokeless tobacco: Never  ? Tobacco comments:  ?  3 cigs per day  ?Substance Use Topics  ? Alcohol use:  No  ?  Alcohol/week: 0.0 standard drinks  ? ? ?Allergies  ?Allergen Reactions  ? Codeine Nausea And Vomiting  ? Sulfa Antibiotics Rash  ? ? ?Current Outpatient Medications  ?Medication Sig Dispense Refill  ? acetaminophen (TYLENOL) 500 MG tablet Take 500 mg by mouth every 6 (six) hours as needed for moderate pain or headache.    ? albuterol (VENTOLIN HFA) 108 (90 Base) MCG/ACT inhaler Inhale 1-2 puffs into the lungs every 4 (four) hours as needed for wheezing or shortness of breath. 1 each 0  ? aspirin EC 81 MG tablet Take 81 mg by mouth daily.    ? clonazePAM (KLONOPIN) 0.5 MG tablet Take 0.5 mg by mouth daily as needed for anxiety.    ? dapagliflozin propanediol (FARXIGA) 10 MG TABS tablet Take 10 mg by mouth daily.     ? escitalopram (LEXAPRO) 20 MG tablet Take 20 mg by mouth daily at 6 (six) AM.    ? fluconazole (DIFLUCAN) 150 MG tablet Take 150 mg by mouth daily as needed (skin infection).    ? fluticasone (FLONASE) 50 MCG/ACT nasal spray Place 1 spray into both nostrils daily. (Patient taking differently: Place 1 spray into both nostrils 2 (two) times daily.) 18 g 0  ? furosemide (LASIX) 20 MG tablet TAKE 1 TABLET BY MOUTH EVERY DAY 90 tablet 3  ? ibuprofen (ADVIL) 600 MG tablet Take 1 tablet (600 mg total) by mouth every 6 (six) hours as needed. (Patient taking differently: Take 600 mg by mouth every 6 (six) hours as needed for headache.) 30 tablet 0  ? isosorbide mononitrate (IMDUR) 30 MG 24 hr tablet Take 1 tablet (30 mg total) by mouth daily. 90 tablet 3  ? ketoconazole (NIZORAL) 2 % cream Apply 1 application topically daily as needed for rash.    ? lisinopril (ZESTRIL) 10 MG tablet TAKE 1 TABLET BY MOUTH EVERY DAY 90 tablet 3  ? metFORMIN (GLUCOPHAGE) 1000 MG tablet Take 1,000 mg by mouth 2 (two) times daily.   6  ? metoprolol succinate (TOPROL-XL) 25 MG 24 hr tablet Take 1 tablet (25 mg total) by mouth daily. 90 tablet 3  ? NOVOLOG FLEXPEN 100 UNIT/ML FlexPen Inject 10 Units into the skin 2 (two) times daily.    ? nystatin (MYCOSTATIN/NYSTOP) powder Apply 1 application topically daily as needed for rash.    ? SYMBICORT 160-4.5 MCG/ACT inhaler Inhale 2 puffs into the lungs daily.    ? TRESIBA FLEXTOUCH 200 UNIT/ML FlexTouch Pen Inject 40 Units into the skin 2 (two) times daily after a meal.    ? Vitamin D, Ergocalciferol, (DRISDOL) 1.25 MG (50000 UNIT) CAPS capsule Take 50,000 Units by mouth once a week.    ? atorvastatin (LIPITOR) 10 MG tablet TAKE 1 TABLET (10 MG TOTAL) BY MOUTH EVERY OTHER DAY. TAKE AT DINNER (Patient taking differently: Take 10 mg by mouth every other day.) 45 tablet 3  ? ?No current facility-administered medications for this visit.  ? ? ?Review of Systems  ?Constitutional:  Constitutional  negative. ?HENT: HENT negative.  ?Eyes: Eyes negative.  ?Respiratory: Respiratory negative.  ?Cardiovascular: Positive for leg swelling.  ?GI: Gastrointestinal negative.  ?Musculoskeletal: Positive for leg pain.  ?Skin: Skin negative.  ?Neurological: Neurological negative. ?Hematologic: Hematologic/lymphatic negative.  ?Psychiatric: Psychiatric negative.   ? ?   ?Objective:  ?Objective  ? ?Vitals:  ? 06/03/21 0927  ?BP: (!) 182/81  ?Pulse: 68  ?Resp: 20  ?Temp: 97.9 ?F (36.6 ?C)  ?SpO2: 90%  ?Weight: 218  lb (98.9 kg)  ?Height: '5\' 4"'$  (1.626 m)  ? ?Body mass index is 37.42 kg/m?. ? ?Physical Exam ?Constitutional:   ?   Appearance: She is obese.  ?HENT:  ?   Head: Normocephalic.  ?   Mouth/Throat:  ?   Mouth: Mucous membranes are moist.  ?Eyes:  ?   Pupils: Pupils are equal, round, and reactive to light.  ?Neck:  ?   Vascular: No carotid bruit.  ?Cardiovascular:  ?   Rate and Rhythm: Normal rate.  ?   Pulses: Normal pulses.     ?     Popliteal pulses are 2+ on the right side and 2+ on the left side.  ?     Dorsalis pedis pulses are 2+ on the right side.  ?     Posterior tibial pulses are 2+ on the right side and 2+ on the left side.  ?Abdominal:  ?   General: Abdomen is flat.  ?   Palpations: Abdomen is soft.  ?Musculoskeletal:  ?   Cervical back: Neck supple.  ?   Right lower leg: Edema present.  ?   Left lower leg: Edema present.  ?Skin: ?   General: Skin is warm and dry.  ?   Capillary Refill: Capillary refill takes less than 2 seconds.  ?Neurological:  ?   General: No focal deficit present.  ?   Mental Status: She is alert.  ?Psychiatric:     ?   Mood and Affect: Mood normal.     ?   Behavior: Behavior normal.     ?   Thought Content: Thought content normal.  ? ? ?Data: ?ABI Findings:  ?+---------+------------------+-----+---------+--------+  ?Right    Rt Pressure (mmHg)IndexWaveform Comment   ?+---------+------------------+-----+---------+--------+  ?Brachial 181                                        ?+---------+------------------+-----+---------+--------+  ?PTA      230               1.27 triphasic          ?+---------+------------------+-----+---------+--------+  ?DP       212               1.17 biphasic           ?+-----

## 2021-07-14 ENCOUNTER — Other Ambulatory Visit: Payer: Self-pay | Admitting: Cardiology

## 2021-07-23 DIAGNOSIS — G4733 Obstructive sleep apnea (adult) (pediatric): Secondary | ICD-10-CM | POA: Diagnosis not present

## 2021-08-22 DIAGNOSIS — G4733 Obstructive sleep apnea (adult) (pediatric): Secondary | ICD-10-CM | POA: Diagnosis not present

## 2021-08-31 ENCOUNTER — Other Ambulatory Visit: Payer: Self-pay | Admitting: Cardiology

## 2021-08-31 MED ORDER — ATORVASTATIN CALCIUM 10 MG PO TABS
10.0000 mg | ORAL_TABLET | ORAL | 3 refills | Status: DC
Start: 2021-08-31 — End: 2021-11-22

## 2021-09-22 DIAGNOSIS — G4733 Obstructive sleep apnea (adult) (pediatric): Secondary | ICD-10-CM | POA: Diagnosis not present

## 2021-09-25 ENCOUNTER — Encounter: Payer: Self-pay | Admitting: Cardiology

## 2021-09-25 ENCOUNTER — Ambulatory Visit: Payer: BC Managed Care – PPO | Attending: Cardiology | Admitting: Cardiology

## 2021-09-25 VITALS — BP 140/54 | HR 73 | Ht 64.0 in | Wt 224.0 lb

## 2021-09-25 DIAGNOSIS — E782 Mixed hyperlipidemia: Secondary | ICD-10-CM | POA: Diagnosis not present

## 2021-09-25 DIAGNOSIS — I25119 Atherosclerotic heart disease of native coronary artery with unspecified angina pectoris: Secondary | ICD-10-CM

## 2021-09-25 MED ORDER — METOPROLOL SUCCINATE ER 25 MG PO TB24: 25.0000 mg | ORAL_TABLET | Freq: Every day | ORAL | 3 refills | Status: DC

## 2021-09-25 NOTE — Progress Notes (Signed)
Cardiology Office Note  Date: 09/25/2021   ID: [name], [name] 04-May-1955, MRN [id number]  PCP:  Monico Blitz, MD  Cardiologist:  Rozann Lesches, MD Electrophysiologist:  None   Chief Complaint  Patient presents with   Cardiac follow-up    History of Present Illness: [name] is a 66 y.o. female last seen in February.  She is here for a follow-up visit.  Reports no definite angina symptoms in the interim.  She did have a spell when she was seated drinking some water one morning, felt very diaphoretic, rapid palpitations and shortness of breath but no chest pain.  This lasted about 5 minutes and resolved spontaneously.  She does not have any history of cardiac arrhythmia.  This was a singular event.  I reviewed her medications which are stable from a cardiac perspective and outlined below.  She had lab work in December 2022 with PCP, LDL was 49 at that time.  I personally reviewed her ECG which shows sinus rhythm with nonspecific T wave changes.  Past Medical History:  Diagnosis Date   Anxiety    Aortic stenosis    CAD (coronary artery disease)    a. 10/2014: Ant STEMI s/p DES to dLAD   Carotid disease, bilateral ([place])    a. Duplex 12/2014: mild-mod atherosclerotic plaque without hemodynamically significant stenosis.   COPD (chronic obstructive pulmonary disease) (HCC)    Essential hypertension    HLD (hyperlipidemia)    Morbid obesity (HCC)    Myocardial infarction ([place]) 11/17/2014   PONV (postoperative nausea and vomiting)    Shingles    Sleep apnea    Uses CPAP   Type 2 diabetes mellitus [facility])     Past Surgical History:  Procedure Laterality Date   ABDOMINAL HYSTERECTOMY     BREAST BIOPSY Left    2016   CARDIAC CATHETERIZATION N/A 11/17/2014   Procedure: Left Heart Cath and Coronary Angiography;  Surgeon: Wellington Hampshire, MD;  Location: [facility];  Service: Cardiovascular;  Laterality: N/A;   CARDIAC CATHETERIZATION N/A 11/17/2014   Procedure:  Coronary Stent Intervention;  Surgeon: Wellington Hampshire, MD;  Location: [facility];  Service: Cardiovascular;  Laterality: N/A;  distal lad 2.25x20 promus   CATARACT EXTRACTION W/PHACO Left 09/20/2016   Procedure: CATARACT EXTRACTION PHACO AND INTRAOCULAR LENS PLACEMENT (IOC);  Surgeon: Tonny Branch, MD;  Location: [facility];  Service: Ophthalmology;  Laterality: Left;  CDE: 9.27   CATARACT EXTRACTION W/PHACO Right 10/18/2016   Procedure: CATARACT EXTRACTION PHACO AND INTRAOCULAR LENS PLACEMENT (IOC);  Surgeon: Tonny Branch, MD;  Location: [facility];  Service: Ophthalmology;  Laterality: Right;  CDE: 6.38   CHOLECYSTECTOMY     COLONOSCOPY WITH PROPOFOL N/A 02/16/2021   Procedure: COLONOSCOPY WITH PROPOFOL;  Surgeon: Eloise Harman, DO;  [facility];  Service: Endoscopy;  Laterality: N/A;  1:30 / ASA 3   POLYPECTOMY  02/16/2021   Procedure: POLYPECTOMY;  Surgeon: Eloise Harman, DO;  [facility];  Service: Endoscopy;;   SPINAL FUSION     cervical; screws and plates.    Current Outpatient Medications  Medication Sig Dispense Refill   acetaminophen (TYLENOL) 500 MG tablet Take 500 mg by mouth every 6 (six) hours as needed for moderate pain or headache.     albuterol (VENTOLIN HFA) 108 (90 Base) MCG/ACT inhaler Inhale 1-2 puffs into the lungs every 4 (four) hours as needed for wheezing or shortness of breath. 1 each 0  aspirin EC 81 MG tablet Take 81 mg by mouth daily.     atorvastatin (LIPITOR) 10 MG tablet Take 1 tablet (10 mg total) by mouth every other day. Take at dinner 45 tablet 3   clonazePAM (KLONOPIN) 0.5 MG tablet Take 0.5 mg by mouth daily as needed for anxiety.     dapagliflozin propanediol (FARXIGA) 10 MG TABS tablet Take 10 mg by mouth daily.     escitalopram (LEXAPRO) 20 MG tablet Take 20 mg by mouth daily at 6 (six) AM.     fluconazole (DIFLUCAN) 150 MG tablet Take 150 mg by mouth daily as needed (skin infection).     fluticasone (FLONASE) 50 MCG/ACT  nasal spray Place 1 spray into both nostrils daily. 18 g 0   furosemide (LASIX) 20 MG tablet TAKE 1 TABLET BY MOUTH EVERY DAY 90 tablet 3   ibuprofen (ADVIL) 600 MG tablet Take 1 tablet (600 mg total) by mouth every 6 (six) hours as needed. (Patient taking differently: Take 600 mg by mouth every 6 (six) hours as needed for headache.) 30 tablet 0   isosorbide mononitrate (IMDUR) 30 MG 24 hr tablet TAKE 1 TABLET BY MOUTH EVERY DAY 90 tablet 3   ketoconazole (NIZORAL) 2 % cream Apply 1 application topically daily as needed for rash.     lisinopril (ZESTRIL) 10 MG tablet TAKE 1 TABLET BY MOUTH EVERY DAY 90 tablet 3   metFORMIN (GLUCOPHAGE) 1000 MG tablet Take 1,000 mg by mouth 2 (two) times daily.   6   NOVOLOG FLEXPEN 100 UNIT/ML FlexPen Inject 10 Units into the skin 2 (two) times daily.     nystatin (MYCOSTATIN/NYSTOP) powder Apply 1 application topically daily as needed for rash.     SYMBICORT 160-4.5 MCG/ACT inhaler Inhale 2 puffs into the lungs daily.     TRESIBA FLEXTOUCH 200 UNIT/ML FlexTouch Pen Inject 40 Units into the skin 2 (two) times daily after a meal.     metoprolol succinate (TOPROL-XL) 25 MG 24 hr tablet Take 1 tablet (25 mg total) by mouth daily. 90 tablet 3   No current facility-administered medications for this visit.   Allergies:  Codeine and Sulfa antibiotics   ROS: No syncope.  Physical Exam: VS:  BP (!) 140/54   Pulse 73   Ht '5\' 4"'$  (1.626 m)   Wt 224 lb (101.6 kg)   SpO2 95%   BMI 38.45 kg/m , BMI Body mass index is 38.45 kg/m.  Wt Readings from Last 3 Encounters:  09/25/21 224 lb (101.6 kg)  06/03/21 218 lb (98.9 kg)  03/04/21 217 lb (98.4 kg)    General: Patient appears comfortable at rest. HEENT: Conjunctiva and lids normal. Neck: Supple, no elevated JVP or carotid bruits, no thyromegaly. Lungs: Clear to auscultation, nonlabored breathing at rest. Cardiac: Regular rate and rhythm, no S3, 2/6 systolic murmur, no pericardial rub. Extremities: No pitting  edema.  ECG:  An ECG dated 07/16/2020 was personally reviewed today and demonstrated:  Sinus rhythm with rightward axis, nonspecific T wave changes.  Recent Labwork: 02/12/2021: BUN 20; Creatinine, Ser 0.91; Potassium 4.0; Sodium 141  December 2022: Cholesterol 101, triglycerides 67, HDL 37, LDL 49  Other Studies Reviewed Today:  Echocardiogram January 2002 [facility]): LVEF of approximately 60%, mildly dilated left atrium, normal RV contraction, calcified aortic valve with mild aortic stenosis and mild aortic regurgitation, peak and mean AV gradient of 19 and 9 mmHg respectively, trace mitral regurgitation, mild to moderate tricuspid regurgitation, and RVSP estimated 51  mmHg.  No pericardial effusion.  ABIs 06/03/2021: Summary:  Right: Resting right ankle-brachial index is within normal range. No  evidence of significant right lower extremity arterial disease. The right  toe-brachial index is normal.   Left: Resting left ankle-brachial index is within normal range. No  evidence of significant left lower extremity arterial disease. The left  toe-brachial index is normal.   Assessment and Plan:  1.  CAD status post anterior STEMI with placement of DES to the LAD in 2018.  She does not report any accelerating angina at this time on medical therapy.  ECG reviewed.  Continue aspirin, Lipitor, Farxiga, Imdur, lisinopril, and Toprol-XL.  2.  Mixed hyperlipidemia, doing well on Lipitor with most recent LDL 49.  3.  Transient episode of diaphoresis and palpitations.  No syncope.  Singular event at this point.  If this recurs would recommend a cardiac monitor to exclude arrhythmia.  I asked her to let us know if symptoms recur.  Medication Adjustments/Labs and Tests Ordered: Current medicines are reviewed at length with the patient today.  Concerns regarding medicines are outlined above.   Tests Ordered: Orders Placed This Encounter  Procedures   EKG 12-Lead    Medication  Changes: Meds ordered this encounter  Medications   metoprolol succinate (TOPROL-XL) 25 MG 24 hr tablet    Sig: Take 1 tablet (25 mg total) by mouth daily.    Dispense:  90 tablet    Refill:  3    Disposition:  Follow up  6 months.  Signed, Satira Sark, MD, [facility] 09/25/2021 3:12 PM    [facility] [address], [place], [place] [address] Phone: [phone]; Fax: [phone]

## 2021-09-25 NOTE — Patient Instructions (Signed)
Medication Instructions:  Your physician recommends that you continue on your current medications as directed. Please refer to the Current Medication list given to you today.   Labwork: None today  Testing/Procedures: None today  Follow-Up: 6 months  Any Other Special Instructions Will Be Listed Below (If Applicable).  If you need a refill on your cardiac medications before your next appointment, please call your pharmacy.  

## 2021-10-12 ENCOUNTER — Other Ambulatory Visit: Payer: Self-pay | Admitting: Cardiology

## 2021-10-12 ENCOUNTER — Ambulatory Visit: Payer: BC Managed Care – PPO | Attending: Cardiology

## 2021-10-12 ENCOUNTER — Encounter: Payer: Self-pay | Admitting: Cardiology

## 2021-10-12 DIAGNOSIS — R002 Palpitations: Secondary | ICD-10-CM

## 2021-10-16 DIAGNOSIS — R002 Palpitations: Secondary | ICD-10-CM

## 2021-11-02 ENCOUNTER — Encounter: Payer: Self-pay | Admitting: Internal Medicine

## 2021-11-02 ENCOUNTER — Ambulatory Visit (INDEPENDENT_AMBULATORY_CARE_PROVIDER_SITE_OTHER): Payer: BC Managed Care – PPO | Admitting: Internal Medicine

## 2021-11-02 VITALS — BP 119/65 | HR 82 | Ht 64.0 in | Wt 225.4 lb

## 2021-11-02 DIAGNOSIS — E782 Mixed hyperlipidemia: Secondary | ICD-10-CM | POA: Diagnosis not present

## 2021-11-02 DIAGNOSIS — Z7689 Persons encountering health services in other specified circumstances: Secondary | ICD-10-CM | POA: Insufficient documentation

## 2021-11-02 DIAGNOSIS — I25119 Atherosclerotic heart disease of native coronary artery with unspecified angina pectoris: Secondary | ICD-10-CM | POA: Diagnosis not present

## 2021-11-02 DIAGNOSIS — J449 Chronic obstructive pulmonary disease, unspecified: Secondary | ICD-10-CM

## 2021-11-02 DIAGNOSIS — I10 Essential (primary) hypertension: Secondary | ICD-10-CM

## 2021-11-02 DIAGNOSIS — E1159 Type 2 diabetes mellitus with other circulatory complications: Secondary | ICD-10-CM

## 2021-11-02 DIAGNOSIS — Z1329 Encounter for screening for other suspected endocrine disorder: Secondary | ICD-10-CM

## 2021-11-02 DIAGNOSIS — Z23 Encounter for immunization: Secondary | ICD-10-CM

## 2021-11-02 DIAGNOSIS — F17200 Nicotine dependence, unspecified, uncomplicated: Secondary | ICD-10-CM

## 2021-11-02 DIAGNOSIS — F419 Anxiety disorder, unspecified: Secondary | ICD-10-CM

## 2021-11-02 DIAGNOSIS — Z1321 Encounter for screening for nutritional disorder: Secondary | ICD-10-CM

## 2021-11-02 DIAGNOSIS — Z1159 Encounter for screening for other viral diseases: Secondary | ICD-10-CM

## 2021-11-02 DIAGNOSIS — I5031 Acute diastolic (congestive) heart failure: Secondary | ICD-10-CM

## 2021-11-02 DIAGNOSIS — I350 Nonrheumatic aortic (valve) stenosis: Secondary | ICD-10-CM

## 2021-11-02 NOTE — Assessment & Plan Note (Signed)
Asymptomatic today.  Unremarkable pulmonary exam.  She is currently prescribed Symbicort for twice daily use and albuterol for as needed use.  She is currently out of albuterol but has not needed to use her inhaler recently.

## 2021-11-02 NOTE — Assessment & Plan Note (Signed)
Euvolemic on exam today.  Asymptomatic.  She is prescribed Lasix 20 mg daily and Toprol-XL 25 mg daily

## 2021-11-02 NOTE — Assessment & Plan Note (Signed)
BP today 119/65.  Her current regimen consists of lisinopril 10 mg daily and Imdur 30 mg daily.  No changes today

## 2021-11-02 NOTE — Assessment & Plan Note (Signed)
Currently smokes 0.5 ppd of cigarettes.  She has been smoking since age 66 and has mostly smoked 1 pack/day over this time.  She has previously tried multiple NRT products, Chantix, and Wellbutrin but has not found sustained success with any of these medications.  She states that she currently has a body system at work and has not been smoking on breaks. -The patient was counseled on the dangers of tobacco use, and was advised to quit.  Reviewed strategies to maximize success, including removing cigarettes and smoking materials from environment, stress management, substitution of other forms of reinforcement, support of family/friends and written materials.

## 2021-11-02 NOTE — Assessment & Plan Note (Signed)
Currently prescribed atorvastatin 10 mg daily.  Repeat lipid panel ordered today. 

## 2021-11-02 NOTE — Assessment & Plan Note (Signed)
Ms. recent HbA1c on file 8.4 from July 2020.  She is currently prescribed Tresiba 40 units twice daily, NovoLog 30 units 3 times daily with meals + sliding scale, metformin 1000 mg twice daily, and Farxiga 10 mg daily.  She has a freestyle libre and checks her blood sugar routinely.  She states that her blood sugar was high over the weekend due to GI illness.  Her blood sugar this morning was 165. -Repeat A1c today -Urine microalbumin/creatinine ratio ordered

## 2021-11-02 NOTE — Assessment & Plan Note (Signed)
Presenting today to establish care.  Recent medical records and lab work reviewed. -Basic labs ordered today, including one-time HCV screening -Influenza vaccine administered today -She plans to receive PCV at follow-up -She has previously undergone hysterectomy -Due for diabetic eye exam.  She has an appointment with her ophthalmologist next month

## 2021-11-02 NOTE — Patient Instructions (Signed)
It was a pleasure to see you today.  Thank you for giving [place] the opportunity to be involved in your care.  Below is a brief recap of your visit and next steps.  We will plan to see you again in 4 weeks.  Summary We will check basic labs today You will receive your flu shot  Next steps Follow up in 4 weeks

## 2021-11-02 NOTE — Progress Notes (Signed)
New Patient Office Visit  Subjective    Patient ID: [name], female    DOB: 07-17-1955  Age: 66 y.o. MRN: [id number]  CC:  Chief Complaint  Patient presents with   Establish Care   HPI [name] presents to establish care.  She is a 67 year old woman with a past medical history significant for T2DM, HTN, CAD, CHF, HLD, COPD, anxiety, and current tobacco use.  She was previously followed by Dr. Manuella Ghazi with [facility].  [name] would like to establish care with a provider in the [facility] health network as she is already established with Dr. Domenic Polite, [place].  She is asymptomatic and otherwise has no acute concerns.  Chronic medical conditions and outstanding preventative healthcare maintenance items discussed today are individually addressed in A/P below.  Outpatient Encounter Medications as of 11/02/2021  Medication Sig   albuterol (VENTOLIN HFA) 108 (90 Base) MCG/ACT inhaler Inhale 1-2 puffs into the lungs every 4 (four) hours as needed for wheezing or shortness of breath.   aspirin EC 81 MG tablet Take 81 mg by mouth daily.   atorvastatin (LIPITOR) 10 MG tablet Take 1 tablet (10 mg total) by mouth every other day. Take at dinner   clonazePAM (KLONOPIN) 0.5 MG tablet Take 0.5 mg by mouth daily as needed for anxiety.   dapagliflozin propanediol (FARXIGA) 10 MG TABS tablet Take 10 mg by mouth daily.   escitalopram (LEXAPRO) 20 MG tablet Take 20 mg by mouth daily at 6 (six) AM.   fluconazole (DIFLUCAN) 150 MG tablet Take 150 mg by mouth daily as needed (skin infection).   fluticasone (FLONASE) 50 MCG/ACT nasal spray Place 1 spray into both nostrils daily.   furosemide (LASIX) 20 MG tablet TAKE 1 TABLET BY MOUTH EVERY DAY   ibuprofen (ADVIL) 600 MG tablet Take 1 tablet (600 mg total) by mouth every 6 (six) hours as needed. (Patient taking differently: Take 600 mg by mouth every 6 (six) hours as needed for headache.)   isosorbide mononitrate (IMDUR) 30 MG 24 hr  tablet TAKE 1 TABLET BY MOUTH EVERY DAY   ketoconazole (NIZORAL) 2 % cream Apply 1 application topically daily as needed for rash.   lisinopril (ZESTRIL) 10 MG tablet TAKE 1 TABLET BY MOUTH EVERY DAY   metFORMIN (GLUCOPHAGE) 1000 MG tablet Take 1,000 mg by mouth 2 (two) times daily.    metoprolol succinate (TOPROL-XL) 25 MG 24 hr tablet Take 1 tablet (25 mg total) by mouth daily.   NOVOLOG FLEXPEN 100 UNIT/ML FlexPen Inject 10 Units into the skin 2 (two) times daily.   nystatin (MYCOSTATIN/NYSTOP) powder Apply 1 application topically daily as needed for rash.   SYMBICORT 160-4.5 MCG/ACT inhaler Inhale 2 puffs into the lungs daily.   TRESIBA FLEXTOUCH 200 UNIT/ML FlexTouch Pen Inject 40 Units into the skin 2 (two) times daily after a meal.   acetaminophen (TYLENOL) 500 MG tablet Take 500 mg by mouth every 6 (six) hours as needed for moderate pain or headache.   No facility-administered encounter medications on file as of 11/02/2021.    Past Medical History:  Diagnosis Date   Anxiety    Aortic stenosis    CAD (coronary artery disease)    a. 10/2014: Ant STEMI s/p DES to dLAD   Carotid disease, bilateral ([place])    a. Duplex 12/2014: mild-mod atherosclerotic plaque without hemodynamically significant stenosis.   COPD (chronic obstructive pulmonary disease) (HCC)    Essential hypertension    HLD (hyperlipidemia)  Morbid obesity ([place])    Myocardial infarction ([place]) 11/17/2014   PONV (postoperative nausea and vomiting)    Shingles    Sleep apnea    Uses CPAP   Type 2 diabetes mellitus [facility])     Past Surgical History:  Procedure Laterality Date   ABDOMINAL HYSTERECTOMY     BREAST BIOPSY Left    2016   CARDIAC CATHETERIZATION N/A 11/17/2014   Procedure: Left Heart Cath and Coronary Angiography;  Surgeon: Wellington Hampshire, MD;  Location: [facility];  Service: Cardiovascular;  Laterality: N/A;   CARDIAC CATHETERIZATION N/A 11/17/2014   Procedure: Coronary Stent Intervention;   Surgeon: Wellington Hampshire, MD;  Location: [facility];  Service: Cardiovascular;  Laterality: N/A;  distal lad 2.25x20 promus   CATARACT EXTRACTION W/PHACO Left 09/20/2016   Procedure: CATARACT EXTRACTION PHACO AND INTRAOCULAR LENS PLACEMENT (IOC);  Surgeon: Tonny Branch, MD;  Location: [facility];  Service: Ophthalmology;  Laterality: Left;  CDE: 9.27   CATARACT EXTRACTION W/PHACO Right 10/18/2016   Procedure: CATARACT EXTRACTION PHACO AND INTRAOCULAR LENS PLACEMENT (IOC);  Surgeon: Tonny Branch, MD;  Location: [facility];  Service: Ophthalmology;  Laterality: Right;  CDE: 6.38   CHOLECYSTECTOMY     COLONOSCOPY WITH PROPOFOL N/A 02/16/2021   Procedure: COLONOSCOPY WITH PROPOFOL;  Surgeon: Eloise Harman, DO;  [facility];  Service: Endoscopy;  Laterality: N/A;  1:30 / ASA 3   POLYPECTOMY  02/16/2021   Procedure: POLYPECTOMY;  Surgeon: Eloise Harman, DO;  [facility];  Service: Endoscopy;;   SPINAL FUSION     cervical; screws and plates.    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  Tobacco Use   Smoking status: Every Day    Packs/day: 0.50    Years: 30.00    Total pack years: 15.00    Types: Cigarettes    Start date: 12/26/1972   Smokeless tobacco: Never   Tobacco comments:    3 cigs per day  Vaping Use   Vaping Use: Never used  Substance and Sexual Activity   Alcohol use: No    Alcohol/week: 0.0 standard drinks of alcohol   Drug use: No   Sexual activity: Yes    Partners: Male    Birth control/protection: Surgical  Other Topics Concern   Not on file  Social History Narrative   Not on file   Social  Determinants of Health   Financial Resource Strain: Not on file  Food Insecurity: Not on file  Transportation Needs: Not on file  Physical Activity: Not on file  Stress: Not on file  Social Connections: Not on file  Intimate Partner Violence: Not on file   Review of Systems  Constitutional:  Negative for chills and fever.  HENT:  Negative for sore throat.   Respiratory:  Negative for cough and shortness of breath.   Cardiovascular:  Negative for chest pain, palpitations and leg swelling.  Gastrointestinal:  Negative for abdominal pain, blood in stool, constipation, diarrhea, nausea and vomiting.  Genitourinary:  Negative for dysuria  and hematuria.  Musculoskeletal:  Negative for myalgias.  Skin:  Negative for itching and rash.  Neurological:  Negative for dizziness and headaches.  Psychiatric/Behavioral:  Negative for depression and suicidal ideas.     Objective    BP 119/65   Pulse 82   Ht '5\' 4"'$  (1.626 m)   Wt 225 lb 6.4 oz (102.2 kg)   SpO2 90%   BMI 38.69 kg/m   Physical Exam Vitals reviewed.  Constitutional:      Appearance: Normal appearance. She is obese.  HENT:     Head: Normocephalic and atraumatic.     Right Ear: External ear normal.     Left Ear: External ear normal.     Nose: Nose normal. No congestion or rhinorrhea.     Mouth/Throat:     Mouth: Mucous membranes are moist.     Pharynx: Oropharynx is clear. No oropharyngeal exudate or posterior oropharyngeal erythema.  Eyes:     General: No scleral icterus.    Extraocular Movements: Extraocular movements intact.     Conjunctiva/sclera: Conjunctivae normal.     Pupils: Pupils are equal, round, and reactive to light.  Cardiovascular:     Rate and Rhythm: Normal rate and regular rhythm.     Pulses: Normal pulses.     Heart sounds: Normal heart sounds. No murmur heard.    No friction rub. No gallop.  Pulmonary:     Effort: Pulmonary effort is normal.     Breath sounds: Normal breath sounds. No wheezing,  rhonchi or rales.  Abdominal:     General: Abdomen is flat. Bowel sounds are normal. There is no distension.     Palpations: Abdomen is soft.     Tenderness: There is no abdominal tenderness.  Musculoskeletal:        General: Normal range of motion.     Cervical back: Normal range of motion.     Right lower leg: No edema.     Left lower leg: No edema.  Skin:    General: Skin is warm and dry.     Capillary Refill: Capillary refill takes less than 2 seconds.     Coloration: Skin is not jaundiced.  Neurological:     General: No focal deficit present.     Mental Status: She is alert and oriented to person, place, and time.  Psychiatric:        Mood and Affect: Mood normal.        Behavior: Behavior normal.    Diabetic foot exam was performed.  No deformities or other abnormal visual findings.  Posterior tibialis and dorsalis pulse intact bilaterally.  Intact to touch and monofilament testing bilaterally.    Last CBC Lab Results  Component Value Date   WBC 7.9 07/16/2020   HGB 14.0 07/16/2020   HCT 44.7 07/16/2020   MCV 96.1 07/16/2020   MCH 30.1 07/16/2020   RDW 15.7 (H) 07/16/2020   PLT 268 07/21/9483   Last metabolic panel Lab Results  Component Value Date   GLUCOSE 163 (H) 02/12/2021   NA 141 02/12/2021   K 4.0 02/12/2021   CL 102 02/12/2021   CO2 27 02/12/2021   BUN 20 02/12/2021   CREATININE 0.91 02/12/2021   GFRNONAA >60 02/12/2021   CALCIUM 9.3 02/12/2021   PROT 6.3 (L) 07/16/2020   ALBUMIN 3.4 (L) 07/16/2020   BILITOT 0.5 07/16/2020   ALKPHOS 56 07/16/2020   AST 19 07/16/2020   ALT 21 07/16/2020   ANIONGAP 12 02/12/2021  Last lipids Lab Results  Component Value Date   CHOL 107 (L) 11/07/2015   HDL 38 (L) 11/07/2015   LDLCALC 38 11/07/2015   TRIG 154 (H) 11/07/2015   CHOLHDL 2.8 11/07/2015   Last hemoglobin A1c Lab Results  Component Value Date   HGBA1C 8.4 (H) 08/25/2018   Last thyroid functions Lab Results  Component Value Date   TSH  2.063 01/14/2015    Assessment & Plan:   Problem List Items Addressed This Visit     CAD (coronary artery disease)    Anterior MI in October 2016.  She is currently prescribed Toprol-XL 25 mg daily, Imdur 30 mg daily, ASA 81 mg, and atorvastatin 10 mg daily.  Closely followed by cardiology. -No changes today      Hypertension    BP today 119/65.  Her current regimen consists of lisinopril 10 mg daily and Imdur 30 mg daily.  No changes today      DM type 2 causing vascular disease ([place]) - Primary    Ms. recent HbA1c on file 8.4 from July 2020.  She is currently prescribed Tresiba 40 units twice daily, NovoLog 30 units 3 times daily with meals + sliding scale, metformin 1000 mg twice daily, and Farxiga 10 mg daily.  She has a freestyle libre and checks her blood sugar routinely.  She states that her blood sugar was high over the weekend due to GI illness.  Her blood sugar this morning was 165. -Repeat A1c today -Urine microalbumin/creatinine ratio ordered      Acute diastolic CHF (congestive heart failure) ([place])    Euvolemic on exam today.  Asymptomatic.  She is prescribed Lasix 20 mg daily and Toprol-XL 25 mg daily      COPD (chronic obstructive pulmonary disease) (HCC)    Asymptomatic today.  Unremarkable pulmonary exam.  She is currently prescribed Symbicort for twice daily use and albuterol for as needed use.  She is currently out of albuterol but has not needed to use her inhaler recently.      Current smoker    Currently smokes 0.5 ppd of cigarettes.  She has been smoking since age 66 and has mostly smoked 1 pack/day over this time.  She has previously tried multiple NRT products, Chantix, and Wellbutrin but has not found sustained success with any of these medications.  She states that she currently has a body system at work and has not been smoking on breaks. -The patient was counseled on the dangers of tobacco use, and was advised to quit.  Reviewed strategies to maximize  success, including removing cigarettes and smoking materials from environment, stress management, substitution of other forms of reinforcement, support of family/friends and written materials.      HLD (hyperlipidemia)    Currently prescribed atorvastatin 10 mg daily.  Repeat lipid panel ordered today      Anxiety    Symptoms currently managed with Lexapro.  No changes today.      Encounter to establish care    Presenting today to establish care.  Recent medical records and lab work reviewed. -Basic labs ordered today, including one-time HCV screening -Influenza vaccine administered today -She plans to receive PCV at follow-up -She has previously undergone hysterectomy -Due for diabetic eye exam.  She has an appointment with her ophthalmologist next month      Return in about 4 weeks (around 11/30/2021).   Johnette Abraham, MD

## 2021-11-02 NOTE — Assessment & Plan Note (Signed)
Symptoms currently managed with Lexapro.  No changes today.

## 2021-11-02 NOTE — Assessment & Plan Note (Signed)
Anterior MI in October 2016.  She is currently prescribed Toprol-XL 25 mg daily, Imdur 30 mg daily, ASA 81 mg, and atorvastatin 10 mg daily.  Closely followed by cardiology. -No changes today

## 2021-11-03 DIAGNOSIS — E876 Hypokalemia: Secondary | ICD-10-CM | POA: Diagnosis not present

## 2021-11-03 DIAGNOSIS — E1165 Type 2 diabetes mellitus with hyperglycemia: Secondary | ICD-10-CM | POA: Diagnosis not present

## 2021-11-03 DIAGNOSIS — E1159 Type 2 diabetes mellitus with other circulatory complications: Secondary | ICD-10-CM | POA: Diagnosis not present

## 2021-11-03 DIAGNOSIS — E782 Mixed hyperlipidemia: Secondary | ICD-10-CM | POA: Diagnosis not present

## 2021-11-05 DIAGNOSIS — Z90710 Acquired absence of both cervix and uterus: Secondary | ICD-10-CM | POA: Insufficient documentation

## 2021-11-05 LAB — CBC WITH DIFFERENTIAL/PLATELET
Basophils Absolute: 0.1 10*3/uL (ref 0.0–0.2)
Basos: 1 %
EOS (ABSOLUTE): 0.1 10*3/uL (ref 0.0–0.4)
Eos: 1 %
Hematocrit: 52.6 % — ABNORMAL HIGH (ref 34.0–46.6)
Hemoglobin: 17.1 g/dL — ABNORMAL HIGH (ref 11.1–15.9)
Immature Grans (Abs): 0 10*3/uL (ref 0.0–0.1)
Immature Granulocytes: 0 %
Lymphocytes Absolute: 1.8 10*3/uL (ref 0.7–3.1)
Lymphs: 20 %
MCH: 29.6 pg (ref 26.6–33.0)
MCHC: 32.5 g/dL (ref 31.5–35.7)
MCV: 91 fL (ref 79–97)
Monocytes Absolute: 0.6 10*3/uL (ref 0.1–0.9)
Monocytes: 6 %
Neutrophils Absolute: 6.5 10*3/uL (ref 1.4–7.0)
Neutrophils: 72 %
Platelets: 223 10*3/uL (ref 150–450)
RBC: 5.78 x10E6/uL — ABNORMAL HIGH (ref 3.77–5.28)
RDW: 14.2 % (ref 11.7–15.4)
WBC: 9 10*3/uL (ref 3.4–10.8)

## 2021-11-05 LAB — LIPID PANEL
Chol/HDL Ratio: 3 ratio (ref 0.0–4.4)
Cholesterol, Total: 118 mg/dL (ref 100–199)
HDL: 40 mg/dL (ref >39)
LDL Chol Calc (NIH): 60 mg/dL (ref 0–99)
Triglycerides: 97 mg/dL (ref 0–149)
VLDL Cholesterol Cal: 18 mg/dL (ref 5–40)

## 2021-11-05 LAB — MICROALBUMIN / CREATININE URINE RATIO
Creatinine, Urine: 40 mg/dL
Microalb/Creat Ratio: 484 mg/g creat — ABNORMAL HIGH (ref 0–29)
Microalbumin, Urine: 193.7 ug/mL

## 2021-11-05 LAB — TSH+FREE T4
Free T4: 1.43 ng/dL (ref 0.82–1.77)
TSH: 2.79 u[IU]/mL (ref 0.450–4.500)

## 2021-11-05 LAB — VITAMIN D 25 HYDROXY (VIT D DEFICIENCY, FRACTURES): Vit D, 25-Hydroxy: 61.8 ng/mL (ref 30.0–100.0)

## 2021-11-05 LAB — CMP14+EGFR
ALT: 18 IU/L (ref 0–32)
AST: 17 IU/L (ref 0–40)
Albumin/Globulin Ratio: 1.5 (ref 1.2–2.2)
Albumin: 4.3 g/dL (ref 3.9–4.9)
Alkaline Phosphatase: 111 IU/L (ref 44–121)
BUN/Creatinine Ratio: 24 (ref 12–28)
BUN: 20 mg/dL (ref 8–27)
Bilirubin Total: 0.5 mg/dL (ref 0.0–1.2)
CO2: 20 mmol/L (ref 20–29)
Calcium: 9.2 mg/dL (ref 8.7–10.3)
Chloride: 100 mmol/L (ref 96–106)
Creatinine, Ser: 0.82 mg/dL (ref 0.57–1.00)
Globulin, Total: 2.8 g/dL (ref 1.5–4.5)
Glucose: 117 mg/dL — ABNORMAL HIGH (ref 70–99)
Potassium: 4.5 mmol/L (ref 3.5–5.2)
Sodium: 138 mmol/L (ref 134–144)
Total Protein: 7.1 g/dL (ref 6.0–8.5)
eGFR: 79 mL/min/{1.73_m2} (ref >59)

## 2021-11-05 LAB — HCV INTERPRETATION

## 2021-11-05 LAB — HEMOGLOBIN A1C
Est. average glucose Bld gHb Est-mCnc: 194 mg/dL
Hgb A1c MFr Bld: 8.4 % — ABNORMAL HIGH (ref 4.8–5.6)

## 2021-11-05 LAB — HCV AB W REFLEX TO QUANT PCR: HCV Ab: NONREACTIVE

## 2021-11-13 ENCOUNTER — Telehealth: Payer: Self-pay

## 2021-11-13 MED ORDER — METOPROLOL SUCCINATE ER 25 MG PO TB24: 37.5000 mg | ORAL_TABLET | Freq: Every day | ORAL | 3 refills | Status: DC

## 2021-11-13 NOTE — Telephone Encounter (Signed)
Patient agrees to increase toprol to 37.5 mg qd,e-scribed to pharmacy

## 2021-11-13 NOTE — Telephone Encounter (Signed)
-----   Message from Satira Sark, MD sent at 11/10/2021  5:30 PM EDT ----- Results reviewed.  Cardiac monitor demonstrated overall rare PACs and PVCs as well as multiple but generally brief episodes of PSVT.  Could try an increase in Toprol-XL to 1-1/2 tablets (37.5 mg) daily and then up-titrate if tolerated.

## 2021-11-21 ENCOUNTER — Encounter (HOSPITAL_COMMUNITY): Payer: Self-pay

## 2021-11-21 ENCOUNTER — Observation Stay (HOSPITAL_COMMUNITY)
Admission: EM | Admit: 2021-11-21 | Discharge: 2021-11-22 | Disposition: A | Discharge status: Home / Self Care | Payer: BC Managed Care – PPO | Attending: Family Medicine | Admitting: Family Medicine

## 2021-11-21 ENCOUNTER — Other Ambulatory Visit: Payer: Self-pay

## 2021-11-21 ENCOUNTER — Emergency Department (HOSPITAL_COMMUNITY): Payer: BC Managed Care – PPO

## 2021-11-21 DIAGNOSIS — R9431 Abnormal electrocardiogram [ECG] [EKG]: Secondary | ICD-10-CM | POA: Diagnosis not present

## 2021-11-21 DIAGNOSIS — Z7982 Long term (current) use of aspirin: Secondary | ICD-10-CM | POA: Insufficient documentation

## 2021-11-21 DIAGNOSIS — R42 Dizziness and giddiness: Secondary | ICD-10-CM | POA: Diagnosis not present

## 2021-11-21 DIAGNOSIS — F17210 Nicotine dependence, cigarettes, uncomplicated: Secondary | ICD-10-CM | POA: Insufficient documentation

## 2021-11-21 DIAGNOSIS — J449 Chronic obstructive pulmonary disease, unspecified: Secondary | ICD-10-CM | POA: Diagnosis not present

## 2021-11-21 DIAGNOSIS — I2510 Atherosclerotic heart disease of native coronary artery without angina pectoris: Secondary | ICD-10-CM | POA: Diagnosis present

## 2021-11-21 DIAGNOSIS — Z7984 Long term (current) use of oral hypoglycemic drugs: Secondary | ICD-10-CM | POA: Diagnosis not present

## 2021-11-21 DIAGNOSIS — Z79899 Other long term (current) drug therapy: Secondary | ICD-10-CM | POA: Insufficient documentation

## 2021-11-21 DIAGNOSIS — G4730 Sleep apnea, unspecified: Secondary | ICD-10-CM

## 2021-11-21 DIAGNOSIS — E1165 Type 2 diabetes mellitus with hyperglycemia: Secondary | ICD-10-CM

## 2021-11-21 DIAGNOSIS — I10 Essential (primary) hypertension: Secondary | ICD-10-CM

## 2021-11-21 DIAGNOSIS — R0602 Shortness of breath: Secondary | ICD-10-CM | POA: Diagnosis not present

## 2021-11-21 DIAGNOSIS — R55 Syncope and collapse: Principal | ICD-10-CM

## 2021-11-21 DIAGNOSIS — R002 Palpitations: Secondary | ICD-10-CM | POA: Diagnosis not present

## 2021-11-21 DIAGNOSIS — E782 Mixed hyperlipidemia: Secondary | ICD-10-CM

## 2021-11-21 DIAGNOSIS — I6529 Occlusion and stenosis of unspecified carotid artery: Secondary | ICD-10-CM | POA: Diagnosis present

## 2021-11-21 DIAGNOSIS — I2102 ST elevation (STEMI) myocardial infarction involving left anterior descending coronary artery: Secondary | ICD-10-CM | POA: Diagnosis present

## 2021-11-21 LAB — TROPONIN I (HIGH SENSITIVITY)
Troponin I (High Sensitivity): 8 ng/L (ref <18)
Troponin I (High Sensitivity): 10 ng/L (ref <18)

## 2021-11-21 LAB — COMPREHENSIVE METABOLIC PANEL
ALT: 22 U/L (ref 0–44)
AST: 24 U/L (ref 15–41)
Albumin: 3.6 g/dL (ref 3.5–5.0)
Alkaline Phosphatase: 77 U/L (ref 38–126)
Anion gap: 10 (ref 5–15)
BUN: 19 mg/dL (ref 8–23)
CO2: 26 mmol/L (ref 22–32)
Calcium: 8.8 mg/dL — ABNORMAL LOW (ref 8.9–10.3)
Chloride: 103 mmol/L (ref 98–111)
Creatinine, Ser: 0.85 mg/dL (ref 0.44–1.00)
GFR, Estimated: >60 mL/min (ref >60)
Glucose, Bld: 265 mg/dL — ABNORMAL HIGH (ref 70–99)
Potassium: 4.1 mmol/L (ref 3.5–5.1)
Sodium: 139 mmol/L (ref 135–145)
Total Bilirubin: 0.4 mg/dL (ref 0.3–1.2)
Total Protein: 7 g/dL (ref 6.5–8.1)

## 2021-11-21 LAB — CBC WITH DIFFERENTIAL/PLATELET
Abs Immature Granulocytes: 0.03 10*3/uL (ref 0.00–0.07)
Basophils Absolute: 0 10*3/uL (ref 0.0–0.1)
Basophils Relative: 0 %
Eosinophils Absolute: 0.1 10*3/uL (ref 0.0–0.5)
Eosinophils Relative: 1 %
HCT: 47.3 % — ABNORMAL HIGH (ref 36.0–46.0)
Hemoglobin: 15.2 g/dL — ABNORMAL HIGH (ref 12.0–15.0)
Immature Granulocytes: 1 %
Lymphocytes Relative: 29 %
Lymphs Abs: 1.9 10*3/uL (ref 0.7–4.0)
MCH: 29.8 pg (ref 26.0–34.0)
MCHC: 32.1 g/dL (ref 30.0–36.0)
MCV: 92.7 fL (ref 80.0–100.0)
Monocytes Absolute: 0.4 10*3/uL (ref 0.1–1.0)
Monocytes Relative: 6 %
Neutro Abs: 4.2 10*3/uL (ref 1.7–7.7)
Neutrophils Relative %: 63 %
Platelets: 244 10*3/uL (ref 150–400)
RBC: 5.1 MIL/uL (ref 3.87–5.11)
RDW: 15 % (ref 11.5–15.5)
WBC: 6.6 10*3/uL (ref 4.0–10.5)
nRBC: 0 % (ref 0.0–0.2)

## 2021-11-21 LAB — GLUCOSE, CAPILLARY: Glucose-Capillary: 136 mg/dL — ABNORMAL HIGH (ref 70–99)

## 2021-11-21 LAB — MAGNESIUM: Magnesium: 1.8 mg/dL (ref 1.7–2.4)

## 2021-11-21 MED ORDER — ACETAMINOPHEN 325 MG PO TABS: 650.0000 mg | ORAL_TABLET | Freq: Four times a day (QID) | ORAL | Status: DC | PRN

## 2021-11-21 MED ORDER — MOMETASONE FURO-FORMOTEROL FUM 200-5 MCG/ACT IN AERO
2.0000 | INHALATION_SPRAY | Freq: Two times a day (BID) | RESPIRATORY_TRACT | Status: DC
  Administered 2021-11-21 – 2021-11-22 (×2): 2 via RESPIRATORY_TRACT
  Filled 2021-11-21: qty 8.8

## 2021-11-21 MED ORDER — ATORVASTATIN CALCIUM 10 MG PO TABS: 10.0000 mg | ORAL_TABLET | ORAL | Status: DC

## 2021-11-21 MED ORDER — LISINOPRIL 10 MG PO TABS
10.0000 mg | ORAL_TABLET | Freq: Every day | ORAL | Status: DC
  Administered 2021-11-22: 10 mg via ORAL
  Filled 2021-11-21 (×2): qty 1

## 2021-11-21 MED ORDER — ASPIRIN 81 MG PO TBEC
81.0000 mg | DELAYED_RELEASE_TABLET | Freq: Every day | ORAL | Status: DC
  Administered 2021-11-21 – 2021-11-22 (×2): 81 mg via ORAL
  Filled 2021-11-21 (×2): qty 1

## 2021-11-21 MED ORDER — INSULIN ASPART 100 UNIT/ML IJ SOLN: 0.0000 [IU] | Freq: Every day | INTRAMUSCULAR | Status: DC

## 2021-11-21 MED ORDER — INSULIN ASPART 100 UNIT/ML IJ SOLN
0.0000 [IU] | Freq: Three times a day (TID) | INTRAMUSCULAR | Status: DC
  Administered 2021-11-22: 5 [IU] via SUBCUTANEOUS

## 2021-11-21 MED ORDER — ACETAMINOPHEN 650 MG RE SUPP: 650.0000 mg | Freq: Four times a day (QID) | RECTAL | Status: DC | PRN

## 2021-11-21 MED ORDER — ISOSORBIDE MONONITRATE ER 60 MG PO TB24
30.0000 mg | ORAL_TABLET | Freq: Every day | ORAL | Status: DC
  Administered 2021-11-22: 30 mg via ORAL
  Filled 2021-11-21: qty 1

## 2021-11-21 MED ORDER — MAGNESIUM SULFATE 2 GM/50ML IV SOLN
2.0000 g | Freq: Once | INTRAVENOUS | Status: AC
  Administered 2021-11-21: 2 g via INTRAVENOUS
  Filled 2021-11-21: qty 50

## 2021-11-21 MED ORDER — INSULIN GLARGINE-YFGN 100 UNIT/ML ~~LOC~~ SOLN
10.0000 [IU] | Freq: Every day | SUBCUTANEOUS | Status: DC
  Administered 2021-11-21: 10 [IU] via SUBCUTANEOUS
  Filled 2021-11-21 (×2): qty 0.1

## 2021-11-21 MED ORDER — METOPROLOL SUCCINATE ER 25 MG PO TB24
25.0000 mg | ORAL_TABLET | Freq: Every day | ORAL | Status: DC
  Administered 2021-11-22: 25 mg via ORAL
  Filled 2021-11-21: qty 1

## 2021-11-21 MED ORDER — ENOXAPARIN SODIUM 60 MG/0.6ML IJ SOSY
55.0000 mg | PREFILLED_SYRINGE | INTRAMUSCULAR | Status: DC
  Administered 2021-11-21: 55 mg via SUBCUTANEOUS
  Filled 2021-11-21: qty 0.6

## 2021-11-21 MED ORDER — ALBUTEROL SULFATE (2.5 MG/3ML) 0.083% IN NEBU: 3.0000 mL | INHALATION_SOLUTION | RESPIRATORY_TRACT | Status: DC | PRN

## 2021-11-21 NOTE — ED Triage Notes (Signed)
Patient states she feels like her heart is speeding up and slowing down. Patient wore a holter monitor for 2 weeks where she was told she had PVCs, PACs. Patient states [facility] with headache and flushed skin.

## 2021-11-21 NOTE — ED Notes (Signed)
Patient states she was getting up form bed, felt her hearty speed up, became dizzy, nauseous and felt like she blacked out.

## 2021-11-21 NOTE — H&P (Signed)
History and Physical    Patient: [name] PYP:[id number] DOB: Jun 01, 1955 DOA: 11/21/2021 DOS: the patient was seen and examined on 11/21/2021 PCP: Johnette Abraham, MD  Patient coming from: Home  Chief Complaint:  Chief Complaint  Patient presents with   Irregular Heart Beat   HPI: [name] is a 66 y.o. female with medical history significant of hypertension, hyperlipidemia, CAD, STEMI s/p DES to  dLAD, type 2 diabetes mellitus and sleep apnea on CPAP who presents to the emergency department due to irregular heartbeat.  Patient states that she has had about 4 syncopal episodes within the last 3 weeks.  She was recently placed on Holter monitor for 14 days and this showed intermittent PACs and PVCs.  This morning, patient states that she was sitting at the side of the bed when she suddenly passed out after a brief moment of lightheadedness for a few minutes and she quickly recovered without any postictal state.  She denies biting of tongue, urinary or bowel incontinence, chest pain, shortness of breath, nausea or vomiting. Metoprolol was recently increased from 25 to 37.5 mg by cardiologist, but she was unable to tolerate the dose, so she was advised to continue taking 25 mg. Dr. Golden Hurter with cardiology was consulted and recommended admitting patient here at [facility] and to do further work-up regarding recurrent episodes of syncope   ED Course:  In the emergency department, patient was intermittently tachypneic, but other vital signs were within normal range, BP was 132/44.  Work-up in the ED showed normal CBC except for elevated H&H 15.2/47.3.  BMP was normal except for blood glucose of 265.  Troponin x2 was negative, magnesium 1.8 CT head without contrast showed no acute intracranial abnormalities Chest x-ray showed cardiomegaly.  Central pulmonary vessels are prominent suggesting mild CHF. There are no signs of alveolar pulmonary edema or focal pulmonary  consolidation. Cardiologist at [facility] was consulted regarding disposition, it was recommended the patient can be admitted here at [facility] Per ED PA.  Hospitalist was asked to admit patient for further evaluation and management.  Review of Systems: Review of systems as noted in the HPI. All other systems reviewed and are negative.   Past Medical History:  Diagnosis Date   Anxiety    Aortic stenosis    CAD (coronary artery disease)    a. 10/2014: Ant STEMI s/p DES to dLAD   Carotid disease, bilateral ([place])    a. Duplex 12/2014: mild-mod atherosclerotic plaque without hemodynamically significant stenosis.   COPD (chronic obstructive pulmonary disease) (HCC)    Essential hypertension    HLD (hyperlipidemia)    Morbid obesity (HCC)    Myocardial infarction ([place]) 11/17/2014   PONV (postoperative nausea and vomiting)    Shingles    Sleep apnea    Uses CPAP   Type 2 diabetes mellitus [facility])    Past Surgical History:  Procedure Laterality Date   BREAST BIOPSY Left    2016   CARDIAC CATHETERIZATION N/A 11/17/2014   Procedure: Left Heart Cath and Coronary Angiography;  Surgeon: Wellington Hampshire, MD;  Location: [place] [facility];  Service: Cardiovascular;  Laterality: N/A;   CARDIAC CATHETERIZATION N/A 11/17/2014   Procedure: Coronary Stent Intervention;  Surgeon: Wellington Hampshire, MD;  Location: [place] [facility];  Service: Cardiovascular;  Laterality: N/A;  distal lad 2.25x20 promus   CATARACT EXTRACTION W/PHACO Left 09/20/2016   Procedure: CATARACT EXTRACTION PHACO AND INTRAOCULAR LENS PLACEMENT (IOC);  Surgeon: Tonny Branch, MD;  Location:  [facility] OR[facility];  Service: Ophthalmology;  Laterality: Left;  CDE: 9.27   CATARACT EXTRACTION W/PHACO Right 10/18/2016   Procedure: CATARACT EXTRACTION PHACO AND INTRAOCULAR LENS PLACEMENT (IOC);  Surgeon: Tonny Branch, MD;  Location: [facility] ORS;  Service: Ophthalmology;  Laterality: Right;  CDE: 6.38   CHOLECYSTECTOMY     COLONOSCOPY WITH PROPOFOL N/A 02/16/2021    Procedure: COLONOSCOPY WITH PROPOFOL;  Surgeon: Eloise Harman, DO;  Location: [facility] ENDO [facility];  Service: Endoscopy;  Laterality: N/A;  1:30 / ASA 3   POLYPECTOMY  02/16/2021   Procedure: POLYPECTOMY;  Surgeon: Eloise Harman, DO;  Location: [facility] END[facility] SUITE;  Service: Endoscopy;;   SPINAL FUSION     cervical; screws and plates.   TOTAL ABDOMINAL HYSTERECTOMY  1980    Social History:  reports that she has been smoking cigarettes. She started smoking about 48 years ago. She has a 15.00 pack-year smoking history. She has never used smokeless tobacco. She reports that she does not drink alcohol and does not use drugs.   Allergies  Allergen Reactions   Codeine Nausea And Vomiting   Sulfa Antibiotics Rash    Family History  Problem Relation Age of Onset   Heart attack Father    Heart disease Father    Cancer Brother      Prior to Admission medications   Medication Sig Start Date End Date Taking? Authorizing Provider  albuterol (VENTOLIN HFA) 108 (90 Base) MCG/ACT inhaler Inhale 1-2 puffs into the lungs every 4 (four) hours as needed for wheezing or shortness of breath. 11/13/20  Yes Melynda Ripple, MD  aspirin EC 81 MG tablet Take 81 mg by mouth daily.   Yes [provider]  atorvastatin (LIPITOR) 10 MG tablet Take 1 tablet (10 mg total) by mouth every other day. Take at dinner 08/31/21 11/29/21 Yes Satira Sark, MD  clonazePAM (KLONOPIN) 0.5 MG tablet Take 0.5 mg by mouth daily as needed for anxiety. 12/29/20  Yes [provider]  dapagliflozin propanediol (FARXIGA) 10 MG TABS tablet Take 10 mg by mouth daily.   Yes [provider]  escitalopram (LEXAPRO) 20 MG tablet Take 20 mg by mouth daily at 6 (six) AM.   Yes [provider]  fluconazole (DIFLUCAN) 150 MG tablet Take 150 mg by mouth daily as needed (skin infection).   Yes [provider]  fluticasone (FLONASE) 50 MCG/ACT nasal spray Place 1 spray into both nostrils daily. 10/24/20   Yes Lamptey, Myrene Galas, MD  furosemide (LASIX) 20 MG tablet TAKE 1 TABLET BY MOUTH EVERY DAY Patient taking differently: Take 20 mg by mouth daily. 05/29/21  Yes Satira Sark, MD  ibuprofen (ADVIL) 600 MG tablet Take 1 tablet (600 mg total) by mouth every 6 (six) hours as needed. Patient taking differently: Take 600 mg by mouth every 6 (six) hours as needed for headache. 11/13/20  Yes Melynda Ripple, MD  isosorbide mononitrate (IMDUR) 30 MG 24 hr tablet TAKE 1 TABLET BY MOUTH EVERY DAY Patient taking differently: Take 30 mg by mouth daily. 07/14/21  Yes Satira Sark, MD  ketoconazole (NIZORAL) 2 % cream Apply 1 application topically daily as needed for rash. 01/23/21  Yes [provider]  lisinopril (ZESTRIL) 10 MG tablet TAKE 1 TABLET BY MOUTH EVERY DAY Patient taking differently: Take 10 mg by mouth daily. 04/27/21  Yes Satira Sark, MD  metFORMIN (GLUCOPHAGE) 1000 MG tablet Take 1,000 mg by mouth 2 (two) times daily.  09/26/14  Yes [provider]  metoprolol succinate (TOPROL XL) 25 MG 24 hr tablet Take 1.5 tablets (37.5 mg total) by mouth daily. Patient taking differently: Take 25 mg by mouth daily. 11/13/21  Yes Satira Sark, MD  NOVOLOG FLEXPEN 100 UNIT/ML FlexPen Inject 10 Units into the skin 2 (two) times daily. 01/28/21  Yes [provider]  nystatin (MYCOSTATIN/NYSTOP) powder Apply 1 application topically daily as needed for rash. 01/23/21  Yes [provider]  SYMBICORT 160-4.5 MCG/ACT inhaler Inhale 2 puffs into the lungs daily. 08/18/20  Yes [provider]  TRESIBA FLEXTOUCH 200 UNIT/ML FlexTouch Pen Inject 40 Units into the skin 2 (two) times daily after a meal. 09/28/19  Yes [provider]    Physical Exam: BP (!) 133/55   Pulse 65   Temp 98.1 F (36.7 C) (Oral)   Resp 18   Ht '5\' 4"'$  (1.626 m)   Wt 106.6 kg   SpO2 91%   BMI 40.34 kg/m   General: 66 y.o. year-old female well developed well nourished in  no acute distress.  Alert and oriented x3. HEENT: NCAT, EOMI Neck: Supple, trachea medial Cardiovascular: Regular rate and rhythm with no rubs or gallops.  No thyromegaly or JVD noted.  No lower extremity edema. 2/4 pulses in all 4 extremities. Respiratory: Clear to auscultation with no wheezes or rales. Good inspiratory effort. Abdomen: Soft, nontender nondistended with normal bowel sounds x4 quadrants. Muskuloskeletal: No cyanosis, clubbing or edema noted bilaterally Neuro: CN II-XII intact, strength 5/5 x 4, sensation, reflexes intact Skin: No ulcerative lesions noted or rashes Psychiatry: Judgement and insight appear normal. Mood is appropriate for condition and setting          Labs on Admission:  Basic Metabolic Panel: Recent Labs  Lab 11/21/21 1339  NA 139  K 4.1  CL 103  CO2 26  GLUCOSE 265*  BUN 19  CREATININE 0.85  CALCIUM 8.8*  MG 1.8   Liver Function Tests: Recent Labs  Lab 11/21/21 1339  AST 24  ALT 22  ALKPHOS 77  BILITOT 0.4  PROT 7.0  ALBUMIN 3.6   No results for input(s): "LIPASE", "AMYLASE" in the last 168 hours. No results for input(s): "AMMONIA" in the last 168 hours. CBC: Recent Labs  Lab 11/21/21 1339  WBC 6.6  NEUTROABS 4.2  HGB 15.2*  HCT 47.3*  MCV 92.7  PLT 244   Cardiac Enzymes: No results for input(s): "CKTOTAL", "CKMB", "CKMBINDEX", "TROPONINI" in the last 168 hours.  BNP (last 3 results) No results for input(s): "BNP" in the last 8760 hours.  ProBNP (last 3 results) No results for input(s): "PROBNP" in the last 8760 hours.  CBG: No results for input(s): "GLUCAP" in the last 168 hours.  Radiological Exams on Admission: CT Head Wo Contrast  Result Date: 11/21/2021 CLINICAL DATA:  Syncope/presyncope.  Dizziness and nausea. EXAM: CT HEAD WITHOUT CONTRAST TECHNIQUE: Contiguous axial images were obtained from the base of the skull through the vertex without intravenous contrast. RADIATION DOSE REDUCTION: This exam was  performed according to the departmental dose-optimization program which includes automated exposure control, adjustment of the mA and/or kV according to patient size and/or use of iterative reconstruction technique. COMPARISON:  09/20/2010. FINDINGS: Brain: No evidence of acute infarction, hemorrhage, hydrocephalus, extra-axial collection or mass lesion/mass effect. Vascular: No hyperdense vessel or unexpected calcification. Skull: Normal. Negative for fracture or focal lesion. Sinuses/Orbits: Globes and orbits are unremarkable. Visualized sinuses are clear. Other: None. IMPRESSION: 1. No acute intracranial abnormalities. Electronically Signed  By: Lajean Manes M.D.   On: 11/21/2021 16:23   DG Chest 2 View  Result Date: 11/21/2021 CLINICAL DATA:  Shortness of breath, dizziness EXAM: CHEST - 2 VIEW COMPARISON:  Previous studies including the examination of 07/16/2020 FINDINGS: Transverse diameter of heart is increased. Central pulmonary vessels are prominent. There are no signs of alveolar pulmonary edema or focal pulmonary consolidation. There is no pleural effusion or pneumothorax. There is surgical fusion in lower cervical spine. IMPRESSION: Cardiomegaly. Central pulmonary vessels are prominent suggesting mild CHF. There are no signs of alveolar pulmonary edema or focal pulmonary consolidation. Electronically Signed   By: Elmer Picker M.D.   On: 11/21/2021 15:24    EKG: I independently viewed the EKG done and my findings are as followed: Normal sinus rhythm at a rate of 70 bpm with prolonged QTc 504 ms.  Assessment/Plan Present on Admission:  Syncope  COPD (chronic obstructive pulmonary disease) (HCC)  CAD (coronary artery disease)  ST elevation (STEMI) myocardial infarction involving left anterior descending coronary artery (HCC)  Principal Problem:   Syncope Active Problems:   ST elevation (STEMI) myocardial infarction involving left anterior descending coronary artery (HCC)   CAD  (coronary artery disease)   COPD (chronic obstructive pulmonary disease) ([place])   Essential hypertension   Mixed hyperlipidemia   Type 2 diabetes mellitus with hyperglycemia (HCC)   Prolonged QT interval   Sleep apnea  Recurrent syncopal episodes Continue telemetry and watch for arrhythmias Troponins  8 > 10 EKG showed normal sinus rhythm at a rate of 70 bpm with prolonged QTc 504 ms Echocardiogram done on 02/04/2020 showed an EF of 59.52% Echocardiogram will be done to rule out significant aortic stenosis or other outflow obstruction, and also to evaluate EF and to rule out segmental/Regional wall motion abnormalities.  Carotid artery Dopplers will be done to rule out hemodynamically significant stenosis  Type 2 diabetes mellitus with uncontrolled hyperglycemia Continue Semglee 10 units and adjust dose accordingly Continue ISS and hypoglycemic protocol  Prolonged QT intervals QTc 504 ms Avoid QT prolonging drugs Magnesium level will be checked Repeat EKG in the morning  Essential  hypertension Continue lisinopril, Toprol XL and Imdur  Mixed hyperlipidemia Continue Lipitor  CAD, STEMI s/p DES to  dLAD Continue aspirin, Lipitor, Toprol-XL, Imdur  Sleep apnea on CPAP Continue CPAP  COPD Continue Ventolin, Dulera  DVT prophylaxis: Lovenox  Code Status: Full code  Consults: None  Family Communication: Daughter at bedside (all questions answered to satisfaction)  Severity of Illness: The appropriate patient status for this patient is OBSERVATION. Observation status is judged to be reasonable and necessary in order to provide the required intensity of service to ensure the patient's safety. The patient's presenting symptoms, physical exam findings, and initial radiographic and laboratory data in the context of their medical condition is felt to place them at decreased risk for further clinical deterioration. Furthermore, it is anticipated that the patient will be medically  stable for discharge from the hospital within 2 midnights of admission.   Author: Bernadette Hoit, DO 11/21/2021 7:15 PM  For on call review [email or web address].

## 2021-11-21 NOTE — ED Provider Notes (Signed)
[facility] EMERGENCY DEPARTMENT Provider Note   CSN: [id number] Arrival date & time: 11/21/21  1238     History  Chief Complaint  Patient presents with   Irregular Heart Beat   HPI [name] is a 66 y.o. female with CAD, history of STEMI, diabetes, HLD, and hypertension presenting for irregular heartbeat.  Patient states this started 3 weeks ago with associated syncopal episode.  She was seen by her cardiologist and was sent home with a Holter monitor for 14 days.  Results of the Holter monitor per patient revealed intermittent PACs and PVCs.  Denies chest pain.  Does endorse shortness of breath for the last few days.  This morning, patient states that she "passed out".  She felt lightheaded before syncopal event.  This also occurred Thursday night as well.  She also feels a heart fluttering sensation that lasts couple of seconds.  States that "happens all the time".  Feels like a "double beat".  States that her cardiologist increased her metoprolol but that she could not tolerate it.  It made her feel more lethargic and nauseous.  HPI     Home Medications Prior to Admission medications   Medication Sig Start Date End Date Taking? Authorizing Provider  albuterol (VENTOLIN HFA) 108 (90 Base) MCG/ACT inhaler Inhale 1-2 puffs into the lungs every 4 (four) hours as needed for wheezing or shortness of breath. 11/13/20  Yes Melynda Ripple, MD  aspirin EC 81 MG tablet Take 81 mg by mouth daily.   Yes [provider]  atorvastatin (LIPITOR) 10 MG tablet Take 1 tablet (10 mg total) by mouth every other day. Take at dinner 08/31/21 11/29/21 Yes Satira Sark, MD  clonazePAM (KLONOPIN) 0.5 MG tablet Take 0.5 mg by mouth daily as needed for anxiety. 12/29/20  Yes [provider]  dapagliflozin propanediol (FARXIGA) 10 MG TABS tablet Take 10 mg by mouth daily.   Yes [provider]  escitalopram (LEXAPRO) 20 MG tablet Take 20 mg by mouth daily at 6 (six) AM.    Yes [provider]  fluconazole (DIFLUCAN) 150 MG tablet Take 150 mg by mouth daily as needed (skin infection).   Yes [provider]  fluticasone (FLONASE) 50 MCG/ACT nasal spray Place 1 spray into both nostrils daily. 10/24/20  Yes Lamptey, Myrene Galas, MD  furosemide (LASIX) 20 MG tablet TAKE 1 TABLET BY MOUTH EVERY DAY Patient taking differently: Take 20 mg by mouth daily. 05/29/21  Yes Satira Sark, MD  ibuprofen (ADVIL) 600 MG tablet Take 1 tablet (600 mg total) by mouth every 6 (six) hours as needed. Patient taking differently: Take 600 mg by mouth every 6 (six) hours as needed for headache. 11/13/20  Yes Melynda Ripple, MD  isosorbide mononitrate (IMDUR) 30 MG 24 hr tablet TAKE 1 TABLET BY MOUTH EVERY DAY Patient taking differently: Take 30 mg by mouth daily. 07/14/21  Yes Satira Sark, MD  ketoconazole (NIZORAL) 2 % cream Apply 1 application topically daily as needed for rash. 01/23/21  Yes [provider]  lisinopril (ZESTRIL) 10 MG tablet TAKE 1 TABLET BY MOUTH EVERY DAY Patient taking differently: Take 10 mg by mouth daily. 04/27/21  Yes Satira Sark, MD  metFORMIN (GLUCOPHAGE) 1000 MG tablet Take 1,000 mg by mouth 2 (two) times daily.  09/26/14  Yes [provider]  metoprolol succinate (TOPROL XL) 25 MG 24 hr tablet Take 1.5 tablets (37.5 mg total) by mouth daily. Patient taking differently: Take 25 mg by  mouth daily. 11/13/21  Yes Satira Sark, MD  NOVOLOG FLEXPEN 100 UNIT/ML FlexPen Inject 10 Units into the skin 2 (two) times daily. 01/28/21  Yes [provider]  nystatin (MYCOSTATIN/NYSTOP) powder Apply 1 application topically daily as needed for rash. 01/23/21  Yes [provider]  SYMBICORT 160-4.5 MCG/ACT inhaler Inhale 2 puffs into the lungs daily. 08/18/20  Yes [provider]  TRESIBA FLEXTOUCH 200 UNIT/ML FlexTouch Pen Inject 40 Units into the skin 2 (two) times daily after a meal. 09/28/19  Yes  [provider]      Allergies    Codeine and Sulfa antibiotics    Review of Systems   Review of Systems  Cardiovascular:  Positive for palpitations.    Physical Exam Updated Vital Signs BP (!) 133/55   Pulse 65   Temp 98.1 F (36.7 C) (Oral)   Resp 18   Ht '5\' 4"'$  (1.626 m)   Wt 106.6 kg   SpO2 91%   BMI 40.34 kg/m  Physical Exam Vitals and nursing note reviewed.  HENT:     Head: Normocephalic and atraumatic.     Mouth/Throat:     Mouth: Mucous membranes are moist.  Eyes:     General:        Right eye: No discharge.        Left eye: No discharge.     Conjunctiva/sclera: Conjunctivae normal.  Cardiovascular:     Rate and Rhythm: Normal rate and regular rhythm.     Pulses: Normal pulses.     Heart sounds: Normal heart sounds.  Pulmonary:     Effort: Pulmonary effort is normal.     Breath sounds: Normal breath sounds.  Abdominal:     General: Abdomen is flat.     Palpations: Abdomen is soft.  Skin:    General: Skin is warm and dry.  Neurological:     General: No focal deficit present.  Psychiatric:        Mood and Affect: Mood normal.     ED Results / Procedures / Treatments   Labs (all labs ordered are listed, but only abnormal results are displayed) Labs Reviewed  COMPREHENSIVE METABOLIC PANEL - Abnormal; Notable for the following components:      Result Value   Glucose, Bld 265 (*)    Calcium 8.8 (*)    All other components within normal limits  CBC WITH DIFFERENTIAL/PLATELET - Abnormal; Notable for the following components:   Hemoglobin 15.2 (*)    HCT 47.3 (*)    All other components within normal limits  MAGNESIUM  TROPONIN I (HIGH SENSITIVITY)  TROPONIN I (HIGH SENSITIVITY)    EKG EKG Interpretation  Date/Time:  Saturday November 21 2021 13:49:29 EDT Ventricular Rate:  70 PR Interval:  128 QRS Duration: 82 QT Interval:  467 QTC Calculation: 504 R Axis:   107 Text Interpretation: Sinus rhythm Probable right ventricular  hypertrophy Abnormal lateral Q waves Borderline T abnormalities, inferior leads Prolonged QT interval Confirmed by Milton Ferguson [phone]) on 11/21/2021 3:45:08 PM  Radiology CT Head Wo Contrast  Result Date: 11/21/2021 CLINICAL DATA:  Syncope/presyncope.  Dizziness and nausea. EXAM: CT HEAD WITHOUT CONTRAST TECHNIQUE: Contiguous axial images were obtained from the base of the skull through the vertex without intravenous contrast. RADIATION DOSE REDUCTION: This exam was performed according to the departmental dose-optimization program which includes automated exposure control, adjustment of the mA and/or kV according to patient size and/or use of iterative reconstruction technique. COMPARISON:  09/20/2010. FINDINGS:  Brain: No evidence of acute infarction, hemorrhage, hydrocephalus, extra-axial collection or mass lesion/mass effect. Vascular: No hyperdense vessel or unexpected calcification. Skull: Normal. Negative for fracture or focal lesion. Sinuses/Orbits: Globes and orbits are unremarkable. Visualized sinuses are clear. Other: None. IMPRESSION: 1. No acute intracranial abnormalities. Electronically Signed   By: Lajean Manes M.D.   On: 11/21/2021 16:23   DG Chest 2 View  Result Date: 11/21/2021 CLINICAL DATA:  Shortness of breath, dizziness EXAM: CHEST - 2 VIEW COMPARISON:  Previous studies including the examination of 07/16/2020 FINDINGS: Transverse diameter of heart is increased. Central pulmonary vessels are prominent. There are no signs of alveolar pulmonary edema or focal pulmonary consolidation. There is no pleural effusion or pneumothorax. There is surgical fusion in lower cervical spine. IMPRESSION: Cardiomegaly. Central pulmonary vessels are prominent suggesting mild CHF. There are no signs of alveolar pulmonary edema or focal pulmonary consolidation. Electronically Signed   By: Elmer Picker M.D.   On: 11/21/2021 15:24    Procedures Procedures    Medications Ordered in  ED Medications  magnesium sulfate IVPB 2 g 50 mL (has no administration in time range)    ED Course/ Medical Decision Making/ A&P                           Medical Decision Making Amount and/or Complexity of Data Reviewed Labs: ordered. Radiology: ordered.   This patient presents to the ED for concern of palpitations, this involves a number of treatment options, and is a complaint that carries with it a palpitations and syncope risk of complications and morbidity.  The differential diagnosis includes arrhythmia, electrolyte derangement, neurally mediated syncope, and dehydration.   Co morbidities: Discussed in HPI    EMR reviewed including pt PMHx, past surgical history and past visits to ER.   See HPI for more details   Lab Tests:   I ordered and independently interpreted labs. Labs notable for hyperglycemia   Imaging Studies:  Abnormal findings. I personally reviewed all imaging studies. Imaging notable for hyperglycemia    Cardiac Monitoring:  The patient was maintained on a cardiac monitor.  I personally viewed and interpreted the cardiac monitored which showed an underlying rhythm of: NSR EKG non-ischemic   Medicines ordered:  No medications given Reevaluation of the patient after these medicines showed that the patient stayed the same I have reviewed the patients home medicines and have made adjustments as needed    Consults/Attending Physician   I requested consultation with Dr. Golden Hurter with cardiology,  and discussed lab and imaging findings as well as pertinent plan - they recommend: Admission to the hospital at [facility] for observation and further work-up regarding recurrent episodes of syncope.   Reevaluation:  After the interventions noted above I re-evaluated patient and found that they have :stayed the same   Problem List / ED Course: Patient presented for irregular heart rate and recurrent syncopal episodes.  Given her multiple  cardiac risk factors, arrhythmogenic syncope versus ACS was of immediate concern.  EKG was normal and troponins were normal which is overall reassuring.  Nevertheless she continues to endorse recurrence of her syncope with unknown etiology at this point.  Consulted cardiology who advised to admit her to the hospital here at [facility] for observation and further work-up regarding her multiple occurrences of syncope.  Consults the hospital team here who accepted her for admission.   Dispostion:  After consideration of the diagnostic results and the  patients response to treatment, I feel that the patient would benefit from admission to the hospital for recurrent syncope in the setting of palpitations.         Final Clinical Impression(s) / ED Diagnoses Final diagnoses:  Palpitations  Syncope, unspecified syncope type    Rx / DC Orders ED Discharge Orders     None         Harriet Pho, PA-C 11/21/21 1740    Tretha Sciara, MD 11/22/21 [phone]

## 2021-11-22 ENCOUNTER — Other Ambulatory Visit (HOSPITAL_COMMUNITY): Payer: BC Managed Care – PPO

## 2021-11-22 ENCOUNTER — Observation Stay (HOSPITAL_COMMUNITY): Payer: BC Managed Care – PPO

## 2021-11-22 ENCOUNTER — Observation Stay (HOSPITAL_BASED_OUTPATIENT_CLINIC_OR_DEPARTMENT_OTHER): Payer: BC Managed Care – PPO

## 2021-11-22 ENCOUNTER — Other Ambulatory Visit (HOSPITAL_COMMUNITY): Payer: Self-pay | Admitting: *Deleted

## 2021-11-22 DIAGNOSIS — R55 Syncope and collapse: Secondary | ICD-10-CM | POA: Diagnosis not present

## 2021-11-22 DIAGNOSIS — I2510 Atherosclerotic heart disease of native coronary artery without angina pectoris: Secondary | ICD-10-CM | POA: Diagnosis not present

## 2021-11-22 DIAGNOSIS — I6523 Occlusion and stenosis of bilateral carotid arteries: Secondary | ICD-10-CM

## 2021-11-22 DIAGNOSIS — I6529 Occlusion and stenosis of unspecified carotid artery: Secondary | ICD-10-CM | POA: Diagnosis present

## 2021-11-22 DIAGNOSIS — I10 Essential (primary) hypertension: Secondary | ICD-10-CM | POA: Diagnosis not present

## 2021-11-22 LAB — GLUCOSE, CAPILLARY
Glucose-Capillary: 119 mg/dL — ABNORMAL HIGH (ref 70–99)
Glucose-Capillary: 213 mg/dL — ABNORMAL HIGH (ref 70–99)

## 2021-11-22 LAB — CBC
HCT: 46.4 % — ABNORMAL HIGH (ref 36.0–46.0)
Hemoglobin: 14.8 g/dL (ref 12.0–15.0)
MCH: 30 pg (ref 26.0–34.0)
MCHC: 31.9 g/dL (ref 30.0–36.0)
MCV: 93.9 fL (ref 80.0–100.0)
Platelets: 232 10*3/uL (ref 150–400)
RBC: 4.94 MIL/uL (ref 3.87–5.11)
RDW: 15.3 % (ref 11.5–15.5)
WBC: 6.3 10*3/uL (ref 4.0–10.5)
nRBC: 0 % (ref 0.0–0.2)

## 2021-11-22 LAB — COMPREHENSIVE METABOLIC PANEL
ALT: 21 U/L (ref 0–44)
AST: 17 U/L (ref 15–41)
Albumin: 3.5 g/dL (ref 3.5–5.0)
Alkaline Phosphatase: 75 U/L (ref 38–126)
Anion gap: 9 (ref 5–15)
BUN: 20 mg/dL (ref 8–23)
CO2: 25 mmol/L (ref 22–32)
Calcium: 8.9 mg/dL (ref 8.9–10.3)
Chloride: 107 mmol/L (ref 98–111)
Creatinine, Ser: 0.83 mg/dL (ref 0.44–1.00)
GFR, Estimated: >60 mL/min (ref >60)
Glucose, Bld: 113 mg/dL — ABNORMAL HIGH (ref 70–99)
Potassium: 4 mmol/L (ref 3.5–5.1)
Sodium: 141 mmol/L (ref 135–145)
Total Bilirubin: 0.6 mg/dL (ref 0.3–1.2)
Total Protein: 6.6 g/dL (ref 6.5–8.1)

## 2021-11-22 LAB — ECHOCARDIOGRAM COMPLETE
AR max vel: 1.33 cm2
AV Area VTI: 1.43 cm2
AV Area mean vel: 1.43 cm2
AV Mean grad: 8.5 mmHg
AV Peak grad: 18.9 mmHg
Ao pk vel: 2.18 m/s
Area-P 1/2: 4.39 cm2
Height: 64 in
S' Lateral: 2.4 cm
Weight: 3614.4 oz

## 2021-11-22 LAB — PHOSPHORUS: Phosphorus: 4.9 mg/dL — ABNORMAL HIGH (ref 2.5–4.6)

## 2021-11-22 LAB — APTT: aPTT: 29 seconds (ref 24–36)

## 2021-11-22 LAB — HIV ANTIBODY (ROUTINE TESTING W REFLEX): HIV Screen 4th Generation wRfx: NONREACTIVE

## 2021-11-22 LAB — MAGNESIUM: Magnesium: 2.2 mg/dL (ref 1.7–2.4)

## 2021-11-22 MED ORDER — METOPROLOL SUCCINATE ER 25 MG PO TB24: 12.5000 mg | ORAL_TABLET | Freq: Every day | ORAL | Status: DC

## 2021-11-22 MED ORDER — ENOXAPARIN SODIUM 60 MG/0.6ML IJ SOSY: 50.0000 mg | PREFILLED_SYRINGE | INTRAMUSCULAR | Status: DC

## 2021-11-22 MED ORDER — METOPROLOL SUCCINATE ER 25 MG PO TB24: 12.5000 mg | ORAL_TABLET | Freq: Every day | ORAL | 1 refills | Status: DC

## 2021-11-22 MED ORDER — TRESIBA FLEXTOUCH 200 UNIT/ML ~~LOC~~ SOPN: 40.0000 [IU] | PEN_INJECTOR | Freq: Every day | SUBCUTANEOUS | Status: DC

## 2021-11-22 MED ORDER — ATORVASTATIN CALCIUM 40 MG PO TABS: 40.0000 mg | ORAL_TABLET | ORAL | 1 refills | Status: DC

## 2021-11-22 NOTE — TOC Progression Note (Signed)
  Transition of Care [facility]) Screening Note   Patient Details  Name: [name] Date of Birth: 16-Jun-1955   Transition of Care [facility]) CM/SW Contact:    Boneta Lucks, RN Phone Number: 11/22/2021, 1:48 PM    Transition of Care Department [facility]) has reviewed patient and no TOC needs have been identified at this time. We will continue to monitor patient advancement through interdisciplinary progression rounds. If new patient transition needs arise, please place a TOC consult.     Expected Discharge Plan: Home/Self Care Barriers to Discharge: Continued Medical Work up  Expected Discharge Plan and Services Expected Discharge Plan: Home/Self Care

## 2021-11-22 NOTE — Progress Notes (Signed)
Nsg Discharge Note  Admit Date:  11/21/2021 Discharge date: 11/22/2021   [name] to be D/C'd Home per MD order.  AVS completed.  Copy for chart, and copy for patient signed, and dated. Patient/caregiver able to verbalize understanding.  Discharge Medication: Allergies as of 11/22/2021       Reactions   Codeine Nausea And Vomiting   Sulfa Antibiotics Rash        Medication List     TAKE these medications    albuterol 108 (90 Base) MCG/ACT inhaler Commonly known as: VENTOLIN HFA Inhale 1-2 puffs into the lungs every 4 (four) hours as needed for wheezing or shortness of breath.   aspirin EC 81 MG tablet Take 81 mg by mouth daily.   atorvastatin 40 MG tablet Commonly known as: LIPITOR Take 1 tablet (40 mg total) by mouth every other day. Take at dinner What changed:  medication strength how much to take   clonazePAM 0.5 MG tablet Commonly known as: KLONOPIN Take 0.5 mg by mouth daily as needed for anxiety.   escitalopram 20 MG tablet Commonly known as: LEXAPRO Take 20 mg by mouth daily at 6 (six) AM.   Farxiga 10 MG Tabs tablet Generic drug: dapagliflozin propanediol Take 10 mg by mouth daily.   fluconazole 150 MG tablet Commonly known as: DIFLUCAN Take 150 mg by mouth daily as needed (skin infection).   fluticasone 50 MCG/ACT nasal spray Commonly known as: FLONASE Place 1 spray into both nostrils daily.   furosemide 20 MG tablet Commonly known as: LASIX TAKE 1 TABLET BY MOUTH EVERY DAY   ibuprofen 600 MG tablet Commonly known as: ADVIL Take 1 tablet (600 mg total) by mouth every 6 (six) hours as needed. What changed: reasons to take this   isosorbide mononitrate 30 MG 24 hr tablet Commonly known as: IMDUR TAKE 1 TABLET BY MOUTH EVERY DAY   ketoconazole 2 % cream Commonly known as: NIZORAL Apply 1 application topically daily as needed for rash.   lisinopril 10 MG tablet Commonly known as: ZESTRIL TAKE 1 TABLET BY MOUTH EVERY DAY    metFORMIN 1000 MG tablet Commonly known as: GLUCOPHAGE Take 1,000 mg by mouth 2 (two) times daily.   metoprolol succinate 25 MG 24 hr tablet Commonly known as: Toprol XL Take 0.5 tablets (12.5 mg total) by mouth daily. What changed: how much to take   NovoLOG FlexPen 100 UNIT/ML FlexPen Generic drug: insulin aspart Inject 10 Units into the skin 2 (two) times daily.   nystatin powder Commonly known as: MYCOSTATIN/NYSTOP Apply 1 application topically daily as needed for rash.   Symbicort 160-4.5 MCG/ACT inhaler Generic drug: budesonide-formoterol Inhale 2 puffs into the lungs daily.   Tyler Aas FlexTouch 200 UNIT/ML FlexTouch Pen Generic drug: insulin degludec Inject 40 Units into the skin daily. What changed: when to take this        Discharge Assessment: Vitals:   11/22/21 1009 11/22/21 1426  BP: (!) 138/52 (!) 129/56  Pulse: 62 (!) 57  Resp:    Temp:  98 F (36.7 C)  SpO2:  90%   Skin clean, dry and intact without evidence of skin break down, no evidence of skin tears noted. IV catheter discontinued intact. Site without signs and symptoms of complications - no redness or edema noted at insertion site, patient denies c/o pain - only slight tenderness at site.  Dressing with slight pressure applied.  D/c Instructions-Education: Discharge instructions given to patient/family with verbalized understanding. D/c education completed with patient/family  including follow up instructions, medication list, d/c activities limitations if indicated, with other d/c instructions as indicated by MD - patient able to verbalize understanding, all questions fully answered. Patient instructed to return to ED, call 911, or call MD for any changes in condition.  Patient escorted via [place], and D/C home via private auto.  Clovis Fredrickson, LPN 17/00/1749 4:49 PM

## 2021-11-22 NOTE — Discharge Instructions (Addendum)
PLEASE REDUCE TOPROL TO 12.5 MG DAILY  PLEASE FOLLOW UP WITH CARDIOLOGIST TO DISCUSS CAROTID ARTERY DISEASE   IMPORTANT INFORMATION: PAY CLOSE ATTENTION   PHYSICIAN DISCHARGE INSTRUCTIONS  Follow with Primary care provider  Johnette Abraham, MD  and other consultants as instructed by your Hospitalist Physician  [place] IF SYMPTOMS COME BACK, WORSEN OR NEW PROBLEM DEVELOPS   Please note: You were cared for by a hospitalist during your hospital stay. Every effort will be made to forward records to your primary care provider.  You can request that your primary care provider send for your hospital records if they have not received them.  Once you are discharged, your primary care physician will handle any further medical issues. Please note that NO REFILLS for any discharge medications will be authorized once you are discharged, as it is imperative that you return to your primary care physician (or establish a relationship with a primary care physician if you do not have one) for your post hospital discharge needs so that they can reassess your need for medications and monitor your lab values.  Please get a complete blood count and chemistry panel checked by your Primary MD at your next visit, and again as instructed by your Primary MD.  Get Medicines reviewed and adjusted: Please take all your medications with you for your next visit with your Primary MD  Laboratory/radiological data: Please request your Primary MD to go over all hospital tests and procedure/radiological results at the follow up, please ask your primary care provider to get all Hospital records sent to his/her office.  In some cases, they will be blood work, cultures and biopsy results pending at the time of your discharge. Please request that your primary care provider follow up on these results.  If you are diabetic, please bring your blood sugar readings with you to your follow up  appointment with primary care.    Please call and make your follow up appointments as soon as possible.    Also Note the following: If you experience worsening of your admission symptoms, develop shortness of breath, life threatening emergency, suicidal or homicidal thoughts you must seek medical attention immediately by calling 911 or calling your MD immediately  if symptoms less severe.  You must read complete instructions/literature along with all the possible adverse reactions/side effects for all the Medicines you take and that have been prescribed to you. Take any new Medicines after you have completely understood and accpet all the possible adverse reactions/side effects.   Do not drive when taking Pain medications or sleeping medications (Benzodiazepines)  Do not take more than prescribed Pain, Sleep and Anxiety Medications. It is not advisable to combine anxiety,sleep and pain medications without talking with your primary care practitioner  Special Instructions: If you have smoked or chewed Tobacco  in the last 2 yrs please stop smoking, stop any regular Alcohol  and or any Recreational drug use.  Wear Seat belts while driving.  Do not drive if taking any narcotic, mind altering or controlled substances or recreational drugs or alcohol.

## 2021-11-22 NOTE — Discharge Summary (Signed)
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 ADMISSION HPI:  66 y.o. female with medical history significant of hypertension, hyperlipidemia, CAD, STEMI s/p DES to  dLAD, type 2 diabetes mellitus and sleep apnea on CPAP who presents to the emergency department due to irregular heartbeat.  Patient states that she has had about 4 syncopal episodes within the last 3 weeks.  She was recently placed on Holter monitor for 14 days and this showed intermittent PACs and PVCs.  This morning, patient states that she was sitting at the side of the bed when she suddenly passed out after a brief moment of lightheadedness for a few minutes and she quickly recovered without any postictal state.  She denies biting of tongue, urinary or bowel incontinence, chest pain, shortness of breath, nausea or vomiting. Metoprolol was recently increased from 25 to 37.5 mg by cardiologist, but she was unable to tolerate the dose, so she was advised to continue taking 25 mg.  Dr. Golden Hurter with cardiology was consulted and recommended admitting patient here at [facility] and to do further work-up regarding recurrent episodes of syncope ED Course:  In the emergency department, patient was intermittently tachypneic, but other vital signs were  within normal range, BP was 132/44.  Work-up in the ED showed normal CBC except for elevated H&H 15.2/47.3.  BMP was normal except for blood glucose of 265.  Troponin x2 was negative, magnesium 1.8.  CT head without contrast showed no acute intracranial abnormalities.  Chest x-ray showed cardiomegaly.  Central pulmonary vessels are prominent suggesting mild CHF. There are no signs of alveolar pulmonary edema or focal pulmonary consolidation.  Cardiologist at [facility] was consulted regarding disposition, it was recommended the patient can be admitted here at [facility] Per ED PA.  Hospitalist was asked to admit patient for further evaluation and management.    HOSPITAL COURSE    Patient was admitted for recurrent syncopal episodes.  She has had about 4 episodes in the last several weeks of passing out.  Definitive cause has not been determined at this time.  She had recently completed a 14-day Zio patch.  She was evaluated in the hospital and we noted that she had some low blood pressure readings and bradycardia heart rate down to the 50s and she felt terrible.  We have reduced her Toprol-XL dose to 12.5 mg daily.  She had a 2D echocardiogram completed and no significant findings to explain her presentation.  We have advised her to have a close follow-up with her PCP and cardiologist in the next 1 to 2 weeks for ongoing evaluation.  Her other work-up revealed bilateral carotid artery stenosis (50-69%) which will need ongoing surveillance.  We have increased her atorvastatin to 40 mg every other day.      Discharge Diagnoses:  Principal Problem:   Syncope Active Problems:  ST elevation (STEMI) myocardial infarction involving left anterior descending coronary artery (HCC)   CAD (coronary artery disease)   COPD (chronic obstructive pulmonary disease) (HCC)   Essential hypertension   Mixed hyperlipidemia   Type 2 diabetes mellitus with hyperglycemia (HCC)   Prolonged QT interval   Sleep apnea   Carotid artery  stenosis   Discharge Instructions: Discharge Instructions     Ambulatory referral to Cardiology   Complete by: As directed       Allergies as of 11/22/2021       Reactions   Codeine Nausea And Vomiting   Sulfa Antibiotics Rash        Medication List     TAKE these medications    albuterol 108 (90 Base) MCG/ACT inhaler Commonly known as: VENTOLIN HFA Inhale 1-2 puffs into the lungs every 4 (four) hours as needed for wheezing or shortness of breath.   aspirin EC 81 MG tablet Take 81 mg by mouth daily.   atorvastatin 40 MG tablet Commonly known as: LIPITOR Take 1 tablet (40 mg total) by mouth every other day. Take at dinner What changed:  medication strength how much to take   clonazePAM 0.5 MG tablet Commonly known as: KLONOPIN Take 0.5 mg by mouth daily as needed for anxiety.   escitalopram 20 MG tablet Commonly known as: LEXAPRO Take 20 mg by mouth daily at 6 (six) AM.   Farxiga 10 MG Tabs tablet Generic drug: dapagliflozin propanediol Take 10 mg by mouth daily.   fluconazole 150 MG tablet Commonly known as: DIFLUCAN Take 150 mg by mouth daily as needed (skin infection).   fluticasone 50 MCG/ACT nasal spray Commonly known as: FLONASE Place 1 spray into both nostrils daily.   furosemide 20 MG tablet Commonly known as: LASIX TAKE 1 TABLET BY MOUTH EVERY DAY   ibuprofen 600 MG tablet Commonly known as: ADVIL Take 1 tablet (600 mg total) by mouth every 6 (six) hours as needed. What changed: reasons to take this   isosorbide mononitrate 30 MG 24 hr tablet Commonly known as: IMDUR TAKE 1 TABLET BY MOUTH EVERY DAY   ketoconazole 2 % cream Commonly known as: NIZORAL Apply 1 application topically daily as needed for rash.   lisinopril 10 MG tablet Commonly known as: ZESTRIL TAKE 1 TABLET BY MOUTH EVERY DAY   metFORMIN 1000 MG tablet Commonly known as: GLUCOPHAGE Take 1,000 mg by mouth 2 (two) times daily.   metoprolol succinate 25 MG 24 hr  tablet Commonly known as: Toprol XL Take 0.5 tablets (12.5 mg total) by mouth daily. What changed: how much to take   NovoLOG FlexPen 100 UNIT/ML FlexPen Generic drug: insulin aspart Inject 10 Units into the skin 2 (two) times daily.   nystatin powder Commonly known as: MYCOSTATIN/NYSTOP Apply 1 application topically daily as needed for rash.   Symbicort 160-4.5 MCG/ACT inhaler Generic drug: budesonide-formoterol Inhale 2 puffs into the lungs daily.   Tyler Aas FlexTouch 200 UNIT/ML FlexTouch Pen Generic drug: insulin degludec Inject 40 Units into the skin daily. What changed: when to take this        Follow-up Information     Johnette Abraham, MD. Schedule an appointment as soon as possible for a visit in 1 week(s).   Specialty: Internal Medicine Why: Hospital Follow Up Contact information: [address] [place] [address] [phone]         [facility] [place]. [place]. Schedule  an appointment as soon as possible for a visit in 2 week(s).   Specialty: Cardiology Why: Hospital Follow Up Contact information: [address] [id number] [place] [address] [phone]               Allergies  Allergen Reactions   Codeine Nausea And Vomiting   Sulfa Antibiotics Rash   Allergies as of 11/22/2021       Reactions   Codeine Nausea And Vomiting   Sulfa Antibiotics Rash        Medication List     TAKE these medications    albuterol 108 (90 Base) MCG/ACT inhaler Commonly known as: VENTOLIN HFA Inhale 1-2 puffs into the lungs every 4 (four) hours as needed for wheezing or shortness of breath.   aspirin EC 81 MG tablet Take 81 mg by mouth daily.   atorvastatin 40 MG tablet Commonly known as: LIPITOR Take 1 tablet (40 mg total) by mouth every other day. Take at dinner What changed:  medication strength how much to take   clonazePAM 0.5 MG tablet Commonly known as:  KLONOPIN Take 0.5 mg by mouth daily as needed for anxiety.   escitalopram 20 MG tablet Commonly known as: LEXAPRO Take 20 mg by mouth daily at 6 (six) AM.   Farxiga 10 MG Tabs tablet Generic drug: dapagliflozin propanediol Take 10 mg by mouth daily.   fluconazole 150 MG tablet Commonly known as: DIFLUCAN Take 150 mg by mouth daily as needed (skin infection).   fluticasone 50 MCG/ACT nasal spray Commonly known as: FLONASE Place 1 spray into both nostrils daily.   furosemide 20 MG tablet Commonly known as: LASIX TAKE 1 TABLET BY MOUTH EVERY DAY   ibuprofen 600 MG tablet Commonly known as: ADVIL Take 1 tablet (600 mg total) by mouth every 6 (six) hours as needed. What changed: reasons to take this   isosorbide mononitrate 30 MG 24 hr tablet Commonly known as: IMDUR TAKE 1 TABLET BY MOUTH EVERY DAY   ketoconazole 2 % cream Commonly known as: NIZORAL Apply 1 application topically daily as needed for rash.   lisinopril 10 MG tablet Commonly known as: ZESTRIL TAKE 1 TABLET BY MOUTH EVERY DAY   metFORMIN 1000 MG tablet Commonly known as: GLUCOPHAGE Take 1,000 mg by mouth 2 (two) times daily.   metoprolol succinate 25 MG 24 hr tablet Commonly known as: Toprol XL Take 0.5 tablets (12.5 mg total) by mouth daily. What changed: how much to take   NovoLOG FlexPen 100 UNIT/ML FlexPen Generic drug: insulin aspart Inject 10 Units into the skin 2 (two) times daily.   nystatin powder Commonly known as: MYCOSTATIN/NYSTOP Apply 1 application topically daily as needed for rash.   Symbicort 160-4.5 MCG/ACT inhaler Generic drug: budesonide-formoterol Inhale 2 puffs into the lungs daily.   Tyler Aas FlexTouch 200 UNIT/ML FlexTouch Pen Generic drug: insulin degludec Inject 40 Units into the skin daily. What changed: when to take this        Procedures/Studies: ECHOCARDIOGRAM COMPLETE  Result Date: 11/22/2021    ECHOCARDIOGRAM REPORT   Patient Name:   [name] Date  of Exam: 11/22/2021 Medical Rec #:  [id number]      Height:       64.0 in Accession #:    [id number]     Weight:       225.9 lb Date of Birth:  02-06-1955      BSA:          2.060 m Patient Age:  65 years       BP:           138/52 mmHg Patient Gender: F              HR:           62 bpm. Exam Location:  [facility] Procedure: 2D Echo, Cardiac Doppler and Color Doppler Indications:    R55 Syncope  History:        Patient has prior history of Echocardiogram examinations, most                 recent 08/28/2018. Previous Myocardial Infarction; Risk                 Factors:Hypertension, Diabetes, Dyslipidemia and Current Smoker.  Sonographer:    Alvino Chapel RCS Referring Phys: [id number] OLADAPO ADEFESO IMPRESSIONS  1. Abnormal septal motion . Left ventricular ejection fraction, by estimation, is 55 to 60%. The left ventricle has normal function. The left ventricle has no regional wall motion abnormalities. Left ventricular diastolic parameters were normal.  2. Right ventricular systolic function is normal. The right ventricular size is normal. There is severely elevated pulmonary artery systolic pressure.  3. Left atrial size was mildly dilated.  4. The mitral valve is abnormal. No evidence of mitral valve regurgitation. No evidence of mitral stenosis.  5. Gradient lower than seen on TTE done 08/28/18. The aortic valve is tricuspid. There is moderate calcification of the aortic valve. There is moderate thickening of the aortic valve. Aortic valve regurgitation is not visualized. Mild aortic valve stenosis.  6. The inferior vena cava is dilated in size with >50% respiratory variability, suggesting right atrial pressure of 8 mmHg. FINDINGS  Left Ventricle: Abnormal septal motion. Left ventricular ejection fraction, by estimation, is 55 to 60%. The left ventricle has normal function. The left ventricle has no regional wall motion abnormalities. The left ventricular internal cavity size was normal in size. There is no left  ventricular hypertrophy. Left ventricular diastolic parameters were normal. Right Ventricle: The right ventricular size is normal. No increase in right ventricular wall thickness. Right ventricular systolic function is normal. There is severely elevated pulmonary artery systolic pressure. The tricuspid regurgitant velocity is 3.61 m/s, and with an assumed right atrial pressure of 15 mmHg, the estimated right ventricular systolic pressure is 45.4 mmHg. Left Atrium: Left atrial size was mildly dilated. Right Atrium: Right atrial size was normal in size. Pericardium: There is no evidence of pericardial effusion. Mitral Valve: The mitral valve is abnormal. There is moderate thickening of the mitral valve leaflet(s). There is moderate calcification of the mitral valve leaflet(s). No evidence of mitral valve regurgitation. No evidence of mitral valve stenosis. Tricuspid Valve: The tricuspid valve is normal in structure. Tricuspid valve regurgitation is not demonstrated. No evidence of tricuspid stenosis. Aortic Valve: Gradient lower than seen on TTE done 08/28/18. The aortic valve is tricuspid. There is moderate calcification of the aortic valve. There is moderate thickening of the aortic valve. Aortic valve regurgitation is not visualized. Mild aortic stenosis is present. Aortic valve mean gradient measures 8.5 mmHg. Aortic valve peak gradient measures 18.9 mmHg. Aortic valve area, by VTI measures 1.43 cm. Pulmonic Valve: The pulmonic valve was normal in structure. Pulmonic valve regurgitation is mild. No evidence of pulmonic stenosis. Aorta: The aortic root is normal in size and structure. Venous: The inferior vena cava is dilated in size with greater than 50% respiratory variability, suggesting right atrial pressure of 8 mmHg. IAS/Shunts: No  atrial level shunt detected by color flow Doppler.  LEFT VENTRICLE PLAX 2D LVIDd:         4.20 cm   Diastology LVIDs:         2.40 cm   LV e' medial:    6.20 cm/s LV PW:          1.10 cm   LV E/e' medial:  18.5 LV IVS:        1.00 cm   LV e' lateral:   8.05 cm/s LVOT diam:     1.80 cm   LV E/e' lateral: 14.3 LV SV:         62 LV SV Index:   30 LVOT Area:     2.54 cm  LEFT ATRIUM             Index        RIGHT ATRIUM           Index LA diam:        4.20 cm 2.04 cm/m   RA Area:     14.90 cm LA Vol (A2C):   46.4 ml 22.52 ml/m  RA Volume:   38.40 ml  18.64 ml/m LA Vol (A4C):   45.7 ml 22.18 ml/m LA Biplane Vol: 48.5 ml 23.54 ml/m  AORTIC VALVE AV Area (Vmax):    1.33 cm AV Area (Vmean):   1.43 cm AV Area (VTI):     1.43 cm AV Vmax:           217.50 cm/s AV Vmean:          134.000 cm/s AV VTI:            0.434 m AV Peak Grad:      18.9 mmHg AV Mean Grad:      8.5 mmHg LVOT Vmax:         114.00 cm/s LVOT Vmean:        75.500 cm/s LVOT VTI:          0.244 m LVOT/AV VTI ratio: 0.56  AORTA Ao Root diam: 3.10 cm MITRAL VALVE                TRICUSPID VALVE MV Area (PHT): 4.39 cm     TR Peak grad:   52.1 mmHg MV Decel Time: 173 msec     TR Vmax:        361.00 cm/s MV E velocity: 115.00 cm/s MV A velocity: 49.70 cm/s   SHUNTS MV E/A ratio:  2.31         Systemic VTI:  0.24 m                             Systemic Diam: 1.80 cm Jenkins Rouge MD Electronically signed by Jenkins Rouge MD Signature Date/Time: 11/22/2021/2:17:12 PM    Final    US Carotid Bilateral  Result Date: 11/22/2021 CLINICAL DATA:  Syncope EXAM: BILATERAL CAROTID DUPLEX ULTRASOUND TECHNIQUE: Pearline Cables scale imaging, color Doppler and duplex ultrasound were performed of bilateral carotid and vertebral arteries in the neck. COMPARISON:  01/17/2015 FINDINGS: Criteria: Quantification of carotid stenosis is based on velocity parameters that correlate the residual internal carotid diameter with NASCET-based stenosis levels, using the diameter of the distal internal carotid lumen as the denominator for stenosis measurement. The following velocity measurements were obtained: RIGHT ICA: 144/25 cm/sec CCA: 67/67 cm/sec SYSTOLIC ICA/CCA  RATIO:  1.5 ECA: 151 cm/sec LEFT ICA: 138/20 cm/sec CCA: 1 1/28  cm/sec SYSTOLIC ICA/CCA RATIO:  1.5 ECA: 187 cm/sec RIGHT CAROTID ARTERY: Long segment mixed echogenicity plaque seen along the posterior wall of the proximal ICA. RIGHT VERTEBRAL ARTERY:  Antegrade flow. LEFT CAROTID ARTERY: Shadowing calcified plaque noted in the distal common and proximal internal carotid arteries. LEFT VERTEBRAL ARTERY:  Antegrade flow. IMPRESSION: 1. Moderate long segment mixed echogenicity plaque of the proximal right ICA with Doppler measurements indicative of 50-69% stenosis. 2. Moderate calcified plaque of the left carotid bifurcation with Doppler measurements indicative of 50-69% stenosis. Electronically Signed   By: Miachel Roux M.D.   On: 11/22/2021 12:26   CT Head Wo Contrast  Result Date: 11/21/2021 CLINICAL DATA:  Syncope/presyncope.  Dizziness and nausea. EXAM: CT HEAD WITHOUT CONTRAST TECHNIQUE: Contiguous axial images were obtained from the base of the skull through the vertex without intravenous contrast. RADIATION DOSE REDUCTION: This exam was performed according to the departmental dose-optimization program which includes automated exposure control, adjustment of the mA and/or kV according to patient size and/or use of iterative reconstruction technique. COMPARISON:  09/20/2010. FINDINGS: Brain: No evidence of acute infarction, hemorrhage, hydrocephalus, extra-axial collection or mass lesion/mass effect. Vascular: No hyperdense vessel or unexpected calcification. Skull: Normal. Negative for fracture or focal lesion. Sinuses/Orbits: Globes and orbits are unremarkable. Visualized sinuses are clear. Other: None. IMPRESSION: 1. No acute intracranial abnormalities. Electronically Signed   By: Lajean Manes M.D.   On: 11/21/2021 16:23   DG Chest 2 View  Result Date: 11/21/2021 CLINICAL DATA:  Shortness of breath, dizziness EXAM: CHEST - 2 VIEW COMPARISON:  Previous studies including the examination of 07/16/2020  FINDINGS: Transverse diameter of heart is increased. Central pulmonary vessels are prominent. There are no signs of alveolar pulmonary edema or focal pulmonary consolidation. There is no pleural effusion or pneumothorax. There is surgical fusion in lower cervical spine. IMPRESSION: Cardiomegaly. Central pulmonary vessels are prominent suggesting mild CHF. There are no signs of alveolar pulmonary edema or focal pulmonary consolidation. Electronically Signed   By: Elmer Picker M.D.   On: 11/21/2021 15:24   LONG TERM MONITOR (3-14 DAYS)  Result Date: 11/10/2021 ZIO XT reviewed.  13 days, 23 hours analyzed. Predominant rhythm is sinus with heart rate ranging from 49 bpm up to 133 bpm and average heart rate 69 bpm. There were rare PACs including atrial couplets and triplets representing less than 1% total beats. There were rare PVCs including ventricular couplets and triplets representing less than 1% total beats. Multiple brief episodes of PSVT were noted, the longest of which was 18 beats in duration.  There was loose correlation between patient triggered events and PACs as well as PSVT. There were no sustained arrhythmias or pauses.     Subjective: Pt had some bradycardia and low BP and agreeable to reducing toprol dose to 12.5 mg daily.  No SOB, no CP and no palpitations.    Discharge Exam: Vitals:   11/22/21 1009 11/22/21 1426  BP: (!) 138/52 (!) 129/56  Pulse: 62 (!) 57  Resp:    Temp:  98 F (36.7 C)  SpO2:  90%   Vitals:   11/22/21 0739 11/22/21 0844 11/22/21 1009 11/22/21 1426  BP: (!) 103/37 (!) 100/50 (!) 138/52 (!) 129/56  Pulse: (!) 52  62 (!) 57  Resp:      Temp:    98 F (36.7 C)  TempSrc:    Oral  SpO2: 98%   90%  Weight:      Height:  General: Pt is alert, awake, not in acute distress Cardiovascular: normal S1/S2 +, no rubs, no gallops Respiratory: CTA bilaterally, no wheezing, no rhonchi Abdominal: Soft, NT, ND, bowel sounds + Extremities: no edema, no  cyanosis   The results of significant diagnostics from this hospitalization (including imaging, microbiology, ancillary and laboratory) are listed below for reference.     Microbiology: No results found for this or any previous visit (from the past 240 hour(s)).   Labs: BNP (last 3 results) No results for input(s): "BNP" in the last 8760 hours. Basic Metabolic Panel: Recent Labs  Lab 11/21/21 1339 11/22/21 0502  NA 139 141  K 4.1 4.0  CL 103 107  CO2 26 25  GLUCOSE 265* 113*  BUN 19 20  CREATININE 0.85 0.83  CALCIUM 8.8* 8.9  MG 1.8 2.2  PHOS  --  4.9*   Liver Function Tests: Recent Labs  Lab 11/21/21 1339 11/22/21 0502  AST 24 17  ALT 22 21  ALKPHOS 77 75  BILITOT 0.4 0.6  PROT 7.0 6.6  ALBUMIN 3.6 3.5   No results for input(s): "LIPASE", "AMYLASE" in the last 168 hours. No results for input(s): "AMMONIA" in the last 168 hours. CBC: Recent Labs  Lab 11/21/21 1339 11/22/21 0502  WBC 6.6 6.3  NEUTROABS 4.2  --   HGB 15.2* 14.8  HCT 47.3* 46.4*  MCV 92.7 93.9  PLT 244 232   Cardiac Enzymes: No results for input(s): "CKTOTAL", "CKMB", "CKMBINDEX", "TROPONINI" in the last 168 hours. BNP: Invalid input(s): "POCBNP" CBG: Recent Labs  Lab 11/21/21 2141 11/22/21 0723 11/22/21 1120  GLUCAP 136* 119* 213*   D-Dimer No results for input(s): "DDIMER" in the last 72 hours. Hgb A1c No results for input(s): "HGBA1C" in the last 72 hours. Lipid Profile No results for input(s): "CHOL", "HDL", "LDLCALC", "TRIG", "CHOLHDL", "LDLDIRECT" in the last 72 hours. Thyroid function studies No results for input(s): "TSH", "T4TOTAL", "T3FREE", "THYROIDAB" in the last 72 hours.  Invalid input(s): "FREET3" Anemia work up No results for input(s): "VITAMINB12", "FOLATE", "FERRITIN", "TIBC", "IRON", "RETICCTPCT" in the last 72 hours. Urinalysis    Component Value Date/Time   COLORURINE YELLOW 10/27/2019 2140   APPEARANCEUR HAZY (A) 10/27/2019 2140   LABSPEC 1.028  10/27/2019 2140   PHURINE 5.0 10/27/2019 2140   GLUCOSEU >=500 (A) 10/27/2019 2140   HGBUR NEGATIVE 10/27/2019 2140   BILIRUBINUR NEGATIVE 10/27/2019 2140   BILIRUBINUR negative 10/22/2019 1900   KETONESUR NEGATIVE 10/27/2019 2140   PROTEINUR NEGATIVE 10/27/2019 2140   UROBILINOGEN 0.2 10/22/2019 1900   NITRITE NEGATIVE 10/27/2019 2140   LEUKOCYTESUR NEGATIVE 10/27/2019 2140   Sepsis Labs Recent Labs  Lab 11/21/21 1339 11/22/21 0502  WBC 6.6 6.3   Microbiology No results found for this or any previous visit (from the past 240 hour(s)).  Time coordinating discharge:  41 mins   SIGNED:  Irwin Brakeman, MD  [facility] 11/22/2021, 2:52 PM How to contact the [facility] Attending or Consulting provider [place] or covering provider during after hours [place], for this patient?  Check the care team in [facility] and look for a) attending/consulting [facility] provider listed and b) the [facility] team listed Log into [email or web address] and use [place]'s universal password to access. If you do not have the password, please contact the hospital operator. Locate the [facility] provider you are looking for under [facility] and page to a number that you can be directly reached. If you still have difficulty reaching the provider, please page the  DOC (Director on Call) for the Hospitalists listed on amion for assistance.

## 2021-11-22 NOTE — Progress Notes (Signed)
*  PRELIMINARY RESULTS* Echocardiogram 2D Echocardiogram has been performed.  [name] 11/22/2021, 2:07 PM

## 2021-11-30 ENCOUNTER — Ambulatory Visit: Payer: BC Managed Care – PPO | Admitting: Internal Medicine

## 2021-12-02 ENCOUNTER — Encounter: Payer: Self-pay | Admitting: Cardiology

## 2021-12-03 NOTE — Progress Notes (Signed)
Cardiology Office Note  Date: 12/04/2021   ID: Randee, Huston 1955/08/10, MRN [id number]  PCP:  Johnette Abraham, MD  Cardiologist:  Rozann Lesches, MD Electrophysiologist:  None   Chief Complaint  Patient presents with   Hospitalization Follow-up    History of Present Illness: [name] is a 66 y.o. female last seen in September.  She was hospitalized in the interim for evaluation of syncope, discharged on October 29, I reviewed the discharge summary.  She had just recently worn a cardiac monitor documenting multiple brief episodes of PSVT as well as atrial and ventricular ectopy, but no sustained arrhythmias or pauses.  Low-dose beta-blocker had been recommended.  No obvious acute causes were found during hospital stay.  She did have bradycardia by cardiac monitoring resulting in reduction in her Toprol-XL to 12.5 mg daily.  Follow-up echocardiogram as noted below.  She had moderate carotid artery stenosis which would not be expected to be causing acute symptoms.  High-sensitivity troponin I levels were also normal.  Head CT showed no acute findings.  We discussed her symptoms today.  It still is not entirely clear to me that the brief episodes of SVT are the explanation.  Certainly, if she had a more prolonged event and this lowered her blood pressure, it could be related.  She does tend to run a low blood pressure at home, states that it is not unusual for her systolic to be in the 22G in the morning when she gets up.  For now we have decided to continue low-dose Toprol-XL and reduce her lisinopril to 5 mg daily.  She also mentions that her mother passed away recently with home hospice, she has been grieving her loss.  Past Medical History:  Diagnosis Date   Anxiety    Aortic stenosis    CAD (coronary artery disease)    a. 10/2014: Ant STEMI s/p DES to dLAD   Carotid disease, bilateral ([place])    a. Duplex 12/2014: mild-mod atherosclerotic plaque without hemodynamically  significant stenosis.   COPD (chronic obstructive pulmonary disease) (HCC)    Essential hypertension    HLD (hyperlipidemia)    Morbid obesity (HCC)    Myocardial infarction ([place]) 11/17/2014   PONV (postoperative nausea and vomiting)    Shingles    Sleep apnea    Uses CPAP   Type 2 diabetes mellitus [facility])     Past Surgical History:  Procedure Laterality Date   BREAST BIOPSY Left    2016   CARDIAC CATHETERIZATION N/A 11/17/2014   Procedure: Left Heart Cath and Coronary Angiography;  Surgeon: Wellington Hampshire, MD;  Location: [place] [facility];  Service: Cardiovascular;  Laterality: N/A;   CARDIAC CATHETERIZATION N/A 11/17/2014   Procedure: Coronary Stent Intervention;  Surgeon: Wellington Hampshire, MD;  Location: [place] [facility];  Service: Cardiovascular;  Laterality: N/A;  distal lad 2.25x20 promus   CATARACT EXTRACTION W/PHACO Left 09/20/2016   Procedure: CATARACT EXTRACTION PHACO AND INTRAOCULAR LENS PLACEMENT (IOC);  Surgeon: Tonny Branch, MD;  Location: [facility];  Service: Ophthalmology;  Laterality: Left;  CDE: 9.27   CATARACT EXTRACTION W/PHACO Right 10/18/2016   Procedure: CATARACT EXTRACTION PHACO AND INTRAOCULAR LENS PLACEMENT (IOC);  Surgeon: Tonny Branch, MD;  Location: [facility];  Service: Ophthalmology;  Laterality: Right;  CDE: 6.38   CHOLECYSTECTOMY     COLONOSCOPY WITH PROPOFOL N/A 02/16/2021   Procedure: COLONOSCOPY WITH PROPOFOL;  Surgeon: Eloise Harman, DO;  [facility];  Service:  Endoscopy;  Laterality: N/A;  1:30 / ASA 3   POLYPECTOMY  02/16/2021   Procedure: POLYPECTOMY;  Surgeon: Eloise Harman, DO;  [facility];  Service: Endoscopy;;   SPINAL FUSION     cervical; screws and plates.   TOTAL ABDOMINAL HYSTERECTOMY  1980    Current Outpatient Medications  Medication Sig Dispense Refill   albuterol (VENTOLIN HFA) 108 (90 Base) MCG/ACT inhaler Inhale 1-2 puffs into the lungs every 4 (four) hours as needed for wheezing or shortness of  breath. 1 each 0   aspirin EC 81 MG tablet Take 81 mg by mouth daily.     atorvastatin (LIPITOR) 40 MG tablet Take 1 tablet (40 mg total) by mouth every other day. Take at dinner 30 tablet 1   clonazePAM (KLONOPIN) 0.5 MG tablet Take 0.5 mg by mouth daily as needed for anxiety.     dapagliflozin propanediol (FARXIGA) 10 MG TABS tablet Take 10 mg by mouth daily.     escitalopram (LEXAPRO) 20 MG tablet Take 20 mg by mouth daily at 6 (six) AM.     fluconazole (DIFLUCAN) 150 MG tablet Take 150 mg by mouth daily as needed (skin infection).     fluticasone (FLONASE) 50 MCG/ACT nasal spray Place 1 spray into both nostrils daily. 18 g 0   furosemide (LASIX) 20 MG tablet TAKE 1 TABLET BY MOUTH EVERY DAY (Patient taking differently: Take 20 mg by mouth daily.) 90 tablet 3   ibuprofen (ADVIL) 600 MG tablet Take 1 tablet (600 mg total) by mouth every 6 (six) hours as needed. (Patient taking differently: Take 600 mg by mouth every 6 (six) hours as needed for headache.) 30 tablet 0   isosorbide mononitrate (IMDUR) 30 MG 24 hr tablet TAKE 1 TABLET BY MOUTH EVERY DAY (Patient taking differently: Take 30 mg by mouth daily.) 90 tablet 3   ketoconazole (NIZORAL) 2 % cream Apply 1 application topically daily as needed for rash.     lisinopril (ZESTRIL) 5 MG tablet Take 1 tablet (5 mg total) by mouth daily. 90 tablet 3   metFORMIN (GLUCOPHAGE) 1000 MG tablet Take 1,000 mg by mouth 2 (two) times daily.   6   metoprolol succinate (TOPROL XL) 25 MG 24 hr tablet Take 0.5 tablets (12.5 mg total) by mouth daily. 15 tablet 1   NOVOLOG FLEXPEN 100 UNIT/ML FlexPen Inject 10 Units into the skin 2 (two) times daily.     nystatin (MYCOSTATIN/NYSTOP) powder Apply 1 application topically daily as needed for rash.     SYMBICORT 160-4.5 MCG/ACT inhaler Inhale 2 puffs into the lungs daily.     TRESIBA FLEXTOUCH 200 UNIT/ML FlexTouch Pen Inject 40 Units into the skin daily.     No current facility-administered medications for this  visit.   Allergies:  Codeine and Sulfa antibiotics   ROS: No orthopnea or PND.   Physical Exam: VS:  BP 130/72   Pulse 75   Ht '5\' 4"'$  (1.626 m)   Wt 221 lb (100.2 kg)   SpO2 90%   BMI 37.93 kg/m , BMI Body mass index is 37.93 kg/m.  Wt Readings from Last 3 Encounters:  12/04/21 221 lb (100.2 kg)  11/21/21 225 lb 14.4 oz (102.5 kg)  11/02/21 225 lb 6.4 oz (102.2 kg)    General: Patient appears comfortable at rest. HEENT: Conjunctiva and lids normal. Neck: Supple, no elevated JVP or carotid bruits. Lungs: Clear to auscultation, nonlabored breathing at rest. Cardiac: Regular rate and rhythm, no S3,  2/6 systolic murmur. Extremities: No pitting edema.  ECG:  An ECG dated 11/21/2021 was personally reviewed today and demonstrated:  Sinus rhythm with rightward axis, nonspecific ST-T changes.  Recent Labwork: 11/03/2021: TSH 2.790 11/22/2021: ALT 21; AST 17; BUN 20; Creatinine, Ser 0.83; Hemoglobin 14.8; Magnesium 2.2; Platelets 232; Potassium 4.0; Sodium 141     Component Value Date/Time   CHOL 118 11/03/2021 1126   TRIG 97 11/03/2021 1126   HDL 40 11/03/2021 1126   CHOLHDL 3.0 11/03/2021 1126   CHOLHDL 2.8 11/07/2015 1037   VLDL 31 (H) 11/07/2015 1037   LDLCALC 60 11/03/2021 1126    Other Studies Reviewed Today:  Cardiac monitor October 2023: ZIO XT reviewed.  13 days, 23 hours analyzed.   Predominant rhythm is sinus with heart rate ranging from 49 bpm up to 133 bpm and average heart rate 69 bpm. There were rare PACs including atrial couplets and triplets representing less than 1% total beats. There were rare PVCs including ventricular couplets and triplets representing less than 1% total beats. Multiple brief episodes of PSVT were noted, the longest of which was 18 beats in duration.  There was loose correlation between patient triggered events and PACs as well as PSVT. There were no sustained arrhythmias or pauses.  Head CT 11/21/2021: IMPRESSION: 1. No acute  intracranial abnormalities.  Carotid Dopplers 11/22/2021: IMPRESSION: 1. Moderate long segment mixed echogenicity plaque of the proximal right ICA with Doppler measurements indicative of 50-69% stenosis. 2. Moderate calcified plaque of the left carotid bifurcation with Doppler measurements indicative of 50-69% stenosis.  Echocardiogram 11/22/2021:  1. Abnormal septal motion . Left ventricular ejection fraction, by  estimation, is 55 to 60%. The left ventricle has normal function. The left  ventricle has no regional wall motion abnormalities. Left ventricular  diastolic parameters were normal.   2. Right ventricular systolic function is normal. The right ventricular  size is normal. There is severely elevated pulmonary artery systolic  pressure.   3. Left atrial size was mildly dilated.   4. The mitral valve is abnormal. No evidence of mitral valve  regurgitation. No evidence of mitral stenosis.   5. Gradient lower than seen on TTE done 08/28/18. The aortic valve is  tricuspid. There is moderate calcification of the aortic valve. There is  moderate thickening of the aortic valve. Aortic valve regurgitation is not  visualized. Mild aortic valve  stenosis.   6. The inferior vena cava is dilated in size with >50% respiratory  variability, suggesting right atrial pressure of 8 mmHg.   Assessment and Plan:  1.  History of syncope, etiology not entirely clear but could potentially be related to intermittent orthostatic hypotension.  Whether or not episodic PSVT as a contributor is also to be considered and for now I would continue Toprol-XL at current dose.  We will reduce her lisinopril to 5 mg daily.  I reviewed recent hospital work-up.  2.  Asymptomatic carotid artery disease, moderate by recent Dopplers.  Continue aspirin and Lipitor.  Her recent LDL was 60.  3.  CAD status post anterior STEMI with DES to the LAD in 2018.  No active angina and no ACS with recent hospitalization.   Continue aspirin, Lipitor, Farxiga, and Imdur.  Medication Adjustments/Labs and Tests Ordered: Current medicines are reviewed at length with the patient today.  Concerns regarding medicines are outlined above.   Tests Ordered: No orders of the defined types were placed in this encounter.   Medication Changes: Meds ordered this encounter  Medications   lisinopril (ZESTRIL) 5 MG tablet    Sig: Take 1 tablet (5 mg total) by mouth daily.    Dispense:  90 tablet    Refill:  3    12/04/21 dose reduced to 5 mg qd    Disposition:  Follow up  3 months.  Signed, Satira Sark, MD, [facility] 12/04/2021 9:45 AM    [facility] at [facility] [address], [place], [place] [address] Phone: [phone]; Fax: [phone]

## 2021-12-04 ENCOUNTER — Encounter: Payer: Self-pay | Admitting: Cardiology

## 2021-12-04 ENCOUNTER — Ambulatory Visit: Payer: BC Managed Care – PPO | Attending: Cardiology | Admitting: Cardiology

## 2021-12-04 VITALS — BP 130/72 | HR 75 | Ht 64.0 in | Wt 221.0 lb

## 2021-12-04 DIAGNOSIS — Z87898 Personal history of other specified conditions: Secondary | ICD-10-CM

## 2021-12-04 DIAGNOSIS — R002 Palpitations: Secondary | ICD-10-CM | POA: Diagnosis not present

## 2021-12-04 DIAGNOSIS — I25119 Atherosclerotic heart disease of native coronary artery with unspecified angina pectoris: Secondary | ICD-10-CM

## 2021-12-04 DIAGNOSIS — I471 Supraventricular tachycardia, unspecified: Secondary | ICD-10-CM

## 2021-12-04 MED ORDER — LISINOPRIL 5 MG PO TABS: 5.0000 mg | ORAL_TABLET | Freq: Every day | ORAL | 3 refills | Status: DC

## 2021-12-04 NOTE — Patient Instructions (Signed)
Medication Instructions:   DECREASE Lisinopril to 5 mg daily   Labwork: None today  Testing/Procedures: None today  Follow-Up: 3 months  Any Other Special Instructions Will Be Listed Below (If Applicable).  If you need a refill on your cardiac medications before your next appointment, please call your pharmacy.

## 2021-12-07 ENCOUNTER — Ambulatory Visit (INDEPENDENT_AMBULATORY_CARE_PROVIDER_SITE_OTHER): Payer: BC Managed Care – PPO | Admitting: Internal Medicine

## 2021-12-07 ENCOUNTER — Encounter: Payer: Self-pay | Admitting: *Deleted

## 2021-12-07 ENCOUNTER — Encounter: Payer: Self-pay | Admitting: Internal Medicine

## 2021-12-07 VITALS — BP 125/70 | HR 74 | Ht 64.0 in | Wt 223.2 lb

## 2021-12-07 DIAGNOSIS — E1165 Type 2 diabetes mellitus with hyperglycemia: Secondary | ICD-10-CM | POA: Diagnosis not present

## 2021-12-07 DIAGNOSIS — J449 Chronic obstructive pulmonary disease, unspecified: Secondary | ICD-10-CM

## 2021-12-07 DIAGNOSIS — Z0001 Encounter for general adult medical examination with abnormal findings: Secondary | ICD-10-CM | POA: Insufficient documentation

## 2021-12-07 DIAGNOSIS — E1159 Type 2 diabetes mellitus with other circulatory complications: Secondary | ICD-10-CM | POA: Diagnosis not present

## 2021-12-07 DIAGNOSIS — Z794 Long term (current) use of insulin: Secondary | ICD-10-CM

## 2021-12-07 MED ORDER — FREESTYLE LIBRE 3 SENSOR MISC: 1.0000 | 3 refills | Status: DC

## 2021-12-07 MED ORDER — SYMBICORT 160-4.5 MCG/ACT IN AERO: 2.0000 | INHALATION_SPRAY | Freq: Every day | RESPIRATORY_TRACT | 3 refills | Status: DC

## 2021-12-07 NOTE — Progress Notes (Signed)
Established Patient Office Visit  Subjective   Patient ID: [name], female    DOB: 1955/02/19  Age: 66 y.o. MRN: [id number]  Chief Complaint  Patient presents with   Follow-up    Med refill   [name] returns to care today.  She is a 66 year old woman with a past medical history significant for T2DM, HTN, CAD, CHF, HLD, COPD, anxiety, and current tobacco use.  She was last seen by me on 10/9 to establish care.  Baseline labs were ordered and 4-week follow-up was arranged.  In the interim, [name] was admitted to [facility] on 10/28 after presenting for recurrent syncopal episodes.  Her work-up was unremarkable and her symptoms were attributed to PSVT.  Toprol-XL was decreased to 12.5 mg daily and atorvastatin was increased to 40 mg daily.  She has not had any additional syncopal episodes since hospital discharge.  She has followed up with her cardiologist (Dr. Domenic Polite) on 11/10.  [name] reports feeling down today.  Her mother recently passed and her funeral was this past weekend.  She has had significant stress recently related to her hospital admission and her mother's end-of-life care.  Aside from feeling down" she denies symptoms currently.  Chronic medical conditions and outstanding preventative care items discussed today are individually addressed in A/P below.  Past Medical History:  Diagnosis Date   Anxiety    Aortic stenosis    CAD (coronary artery disease)    a. 10/2014: Ant STEMI s/p DES to dLAD   Carotid disease, bilateral ([place])    a. Duplex 12/2014: mild-mod atherosclerotic plaque without hemodynamically significant stenosis.   COPD (chronic obstructive pulmonary disease) (HCC)    Essential hypertension    HLD (hyperlipidemia)    Morbid obesity (HCC)    Myocardial infarction ([place]) 11/17/2014   PONV (postoperative nausea and vomiting)    Shingles    Sleep apnea    Uses CPAP   Type 2 diabetes mellitus [facility])    Past Surgical History:  Procedure  Laterality Date   BREAST BIOPSY Left    2016   CARDIAC CATHETERIZATION N/A 11/17/2014   Procedure: Left Heart Cath and Coronary Angiography;  Surgeon: Wellington Hampshire, MD;  Location: [place] [facility];  Service: Cardiovascular;  Laterality: N/A;   CARDIAC CATHETERIZATION N/A 11/17/2014   Procedure: Coronary Stent Intervention;  Surgeon: Wellington Hampshire, MD;  Location: [place] [facility];  Service: Cardiovascular;  Laterality: N/A;  distal lad 2.25x20 promus   CATARACT EXTRACTION W/PHACO Left 09/20/2016   Procedure: CATARACT EXTRACTION PHACO AND INTRAOCULAR LENS PLACEMENT (IOC);  Surgeon: Tonny Branch, MD;  Location: [facility];  Service: Ophthalmology;  Laterality: Left;  CDE: 9.27   CATARACT EXTRACTION W/PHACO Right 10/18/2016   Procedure: CATARACT EXTRACTION PHACO AND INTRAOCULAR LENS PLACEMENT (IOC);  Surgeon: Tonny Branch, MD;  Location: [facility];  Service: Ophthalmology;  Laterality: Right;  CDE: 6.38   CHOLECYSTECTOMY     COLONOSCOPY WITH PROPOFOL N/A 02/16/2021   Procedure: COLONOSCOPY WITH PROPOFOL;  Surgeon: Eloise Harman, DO;  [facility];  Service: Endoscopy;  Laterality: N/A;  1:30 / ASA 3   POLYPECTOMY  02/16/2021   Procedure: POLYPECTOMY;  Surgeon: Eloise Harman, DO;  [facility];  Service: Endoscopy;;   SPINAL FUSION     cervical; screws and plates.   TOTAL ABDOMINAL HYSTERECTOMY  1980   Social History   Tobacco Use   Smoking status: Every Day    Packs/day:  0.50    Years: 30.00    Total pack years: 15.00    Types: Cigarettes    Start date: 12/26/1972   Smokeless tobacco: Never   Tobacco comments:    3 cigs per day  Vaping Use   Vaping Use: Never used  Substance Use Topics   Alcohol use: No    Alcohol/week: 0.0 standard drinks of alcohol   Drug use: No   Family History  Problem Relation Age of Onset   Heart attack Father    Heart disease Father    Cancer Brother    Allergies  Allergen Reactions   Codeine Nausea And Vomiting    Sulfa Antibiotics Rash   Review of Systems  Psychiatric/Behavioral:         Feeling down -her mother recently passed  All other systems reviewed and are negative.    Objective:     BP 125/70   Pulse 74   Ht '5\' 4"'$  (1.626 m)   Wt 223 lb 3.2 oz (101.2 kg)   SpO2 (!) 89%   BMI 38.31 kg/m  BP Readings from Last 3 Encounters:  12/07/21 125/70  12/04/21 130/72  11/22/21 (!) 129/56   Physical Exam Vitals reviewed.  Constitutional:      Appearance: Normal appearance. She is obese.  HENT:     Head: Normocephalic and atraumatic.     Right Ear: External ear normal.     Left Ear: External ear normal.     Nose: Nose normal. No congestion or rhinorrhea.     Mouth/Throat:     Mouth: Mucous membranes are moist.     Pharynx: Oropharynx is clear. No oropharyngeal exudate or posterior oropharyngeal erythema.  Eyes:     General: No scleral icterus.    Extraocular Movements: Extraocular movements intact.     Conjunctiva/sclera: Conjunctivae normal.     Pupils: Pupils are equal, round, and reactive to light.  Cardiovascular:     Rate and Rhythm: Normal rate and regular rhythm.     Pulses: Normal pulses.     Heart sounds: Normal heart sounds. No murmur heard.    No friction rub. No gallop.  Pulmonary:     Effort: Pulmonary effort is normal.     Breath sounds: Normal breath sounds. No wheezing, rhonchi or rales.  Abdominal:     General: Abdomen is flat. Bowel sounds are normal. There is no distension.     Palpations: Abdomen is soft.     Tenderness: There is no abdominal tenderness.  Musculoskeletal:        General: Normal range of motion.     Cervical back: Normal range of motion.     Right lower leg: No edema.     Left lower leg: No edema.  Skin:    General: Skin is warm and dry.     Capillary Refill: Capillary refill takes less than 2 seconds.     Coloration: Skin is not jaundiced.  Neurological:     General: No focal deficit present.     Mental Status: She is alert and  oriented to person, place, and time.  Psychiatric:        Mood and Affect: Mood normal.        Behavior: Behavior normal.    Last CBC Lab Results  Component Value Date   WBC 6.3 11/22/2021   HGB 14.8 11/22/2021   HCT 46.4 (H) 11/22/2021   MCV 93.9 11/22/2021   MCH 30.0 11/22/2021   RDW 15.3 11/22/2021  PLT 232 27/78/2423   Last metabolic panel Lab Results  Component Value Date   GLUCOSE 113 (H) 11/22/2021   NA 141 11/22/2021   K 4.0 11/22/2021   CL 107 11/22/2021   CO2 25 11/22/2021   BUN 20 11/22/2021   CREATININE 0.83 11/22/2021   GFRNONAA >60 11/22/2021   CALCIUM 8.9 11/22/2021   PHOS 4.9 (H) 11/22/2021   PROT 6.6 11/22/2021   ALBUMIN 3.5 11/22/2021   LABGLOB 2.8 11/03/2021   AGRATIO 1.5 11/03/2021   BILITOT 0.6 11/22/2021   ALKPHOS 75 11/22/2021   AST 17 11/22/2021   ALT 21 11/22/2021   ANIONGAP 9 11/22/2021   Last lipids Lab Results  Component Value Date   CHOL 118 11/03/2021   HDL 40 11/03/2021   LDLCALC 60 11/03/2021   TRIG 97 11/03/2021   CHOLHDL 3.0 11/03/2021   Last hemoglobin A1c Lab Results  Component Value Date   HGBA1C 8.4 (H) 11/03/2021   Last thyroid functions Lab Results  Component Value Date   TSH 2.790 11/03/2021   Last vitamin D Lab Results  Component Value Date   VD25OH 61.8 11/03/2021     Assessment & Plan:   Problem List Items Addressed This Visit       Type 2 diabetes mellitus with hyperglycemia (HCC)    A1c 8.4 last month.  She is currently prescribed Tresiba 40 units twice daily, NovoLog 10 units twice daily and sliding scale with meals, metformin 1000 mg twice daily, and Farxiga 10 mg daily.  She has a freestyle libre and checks her blood sugar routinely.  She reports elevated readings recently, which she attributes to stress. -No changes today.  I have asked Ms. Baskett to keep a blood sugar log over the next month to bring with her to follow-up in 1 month.  I am concerned for over-basalization as she is taking 80  total units of [place] daily.  We will make adjustments to her insulin regimen at follow-up.  Of note, she is not currently on GLP-1 therapy.  She states that she was previously prescribed Ozempic and "got sick".  Consider starting Mounjaro at follow-up. -Missed recent ophthalmology appointment due to illness but will reschedule for next month      Encounter for general adult medical examination with abnormal findings    -Due for PCV 20 and shingles vaccines.  She will schedule an nurse appointment for later this week to receive these injections.       Return in about 4 weeks (around 01/04/2022).    Johnette Abraham, MD

## 2021-12-07 NOTE — Assessment & Plan Note (Signed)
A1c 8.4 last month.  She is currently prescribed Tresiba 40 units twice daily, NovoLog 10 units twice daily and sliding scale with meals, metformin 1000 mg twice daily, and Farxiga 10 mg daily.  She has a freestyle libre and checks her blood sugar routinely.  She reports elevated readings recently, which she attributes to stress. -No changes today.  I have asked [name] to keep a blood sugar log over the next month to bring with her to follow-up in 1 month.  I am concerned for over-basalization as she is taking 80 total units of [place] daily.  We will make adjustments to her insulin regimen at follow-up.  Of note, she is not currently on GLP-1 therapy.  She states that she was previously prescribed Ozempic and "got sick".  Consider starting Mounjaro at follow-up. -Missed recent ophthalmology appointment due to illness but will reschedule for next month

## 2021-12-07 NOTE — Assessment & Plan Note (Signed)
-  Due for PCV 20 and shingles vaccines.  She will schedule an nurse appointment for later this week to receive these injections.

## 2021-12-07 NOTE — Patient Instructions (Signed)
It was a pleasure to see you today.  Thank you for giving [place] the opportunity to be involved in your care.  Below is a brief recap of your visit and next steps.  We will plan to see you again in 4 weeks.  Summary I have refilled your medications today We will follow up in 4 weeks for diabetes. In the interim, please keep a blood sugar log at home. We will schedule a nurse appointment for shingles and pneumonia vaccines on Friday.

## 2021-12-08 ENCOUNTER — Telehealth (INDEPENDENT_AMBULATORY_CARE_PROVIDER_SITE_OTHER): Payer: BC Managed Care – PPO | Admitting: Adult Health

## 2021-12-08 DIAGNOSIS — G4733 Obstructive sleep apnea (adult) (pediatric): Secondary | ICD-10-CM | POA: Diagnosis not present

## 2022-01-05 ENCOUNTER — Ambulatory Visit: Payer: BC Managed Care – PPO | Admitting: Internal Medicine

## 2022-01-06 ENCOUNTER — Encounter: Payer: Self-pay | Admitting: Internal Medicine

## 2022-01-06 ENCOUNTER — Ambulatory Visit (INDEPENDENT_AMBULATORY_CARE_PROVIDER_SITE_OTHER): Payer: BC Managed Care – PPO | Admitting: Internal Medicine

## 2022-01-06 VITALS — BP 118/67 | HR 75 | Ht 64.0 in | Wt 238.2 lb

## 2022-01-06 DIAGNOSIS — I5031 Acute diastolic (congestive) heart failure: Secondary | ICD-10-CM

## 2022-01-06 DIAGNOSIS — Z1152 Encounter for screening for COVID-19: Secondary | ICD-10-CM | POA: Diagnosis not present

## 2022-01-06 DIAGNOSIS — R0602 Shortness of breath: Secondary | ICD-10-CM | POA: Insufficient documentation

## 2022-01-06 DIAGNOSIS — E1165 Type 2 diabetes mellitus with hyperglycemia: Secondary | ICD-10-CM

## 2022-01-06 DIAGNOSIS — R0609 Other forms of dyspnea: Secondary | ICD-10-CM | POA: Insufficient documentation

## 2022-01-06 DIAGNOSIS — Z794 Long term (current) use of insulin: Secondary | ICD-10-CM

## 2022-01-06 DIAGNOSIS — E1159 Type 2 diabetes mellitus with other circulatory complications: Secondary | ICD-10-CM

## 2022-01-06 MED ORDER — FUROSEMIDE 20 MG PO TABS: 20.0000 mg | ORAL_TABLET | Freq: Every day | ORAL | 3 refills | Status: DC

## 2022-01-06 MED ORDER — FREESTYLE LIBRE 3 SENSOR MISC: 1.0000 | 3 refills | Status: DC

## 2022-01-06 MED ORDER — TIRZEPATIDE 2.5 MG/0.5ML ~~LOC~~ SOAJ: 2.5000 mg | SUBCUTANEOUS | 0 refills | Status: AC

## 2022-01-06 NOTE — Progress Notes (Signed)
Established Patient Office Visit  Subjective   Patient ID: [name], female    DOB: 1955/03/22  Age: 66 y.o. MRN: [id number]  Chief Complaint  Patient presents with   Diabetes    Follow up   Medication Refill    Needs lasix, retaining fluid   Shortness of Breath    Since 01/05/2022   [name] returns to care today for diabetes follow-up.  She was last seen by me on 11/13 for follow-up.  She endorsed significant stress at that time related to the recent passing of her mother.  She reported elevated blood sugar readings at that time, which she attributed to stress.  4-week follow-up was arranged.  There have been no acute interval events.  Today she additionally endorses recent shortness of breath and fatigue.  She denies any known sick contacts as well as fever/chills and cough/sputum production.  Past Medical History:  Diagnosis Date   Anxiety    Aortic stenosis    CAD (coronary artery disease)    a. 10/2014: Ant STEMI s/p DES to dLAD   Carotid disease, bilateral ([place])    a. Duplex 12/2014: mild-mod atherosclerotic plaque without hemodynamically significant stenosis.   COPD (chronic obstructive pulmonary disease) (HCC)    Essential hypertension    HLD (hyperlipidemia)    Morbid obesity (HCC)    Myocardial infarction ([place]) 11/17/2014   PONV (postoperative nausea and vomiting)    Shingles    Sleep apnea    Uses CPAP   Type 2 diabetes mellitus [facility])    Past Surgical History:  Procedure Laterality Date   BREAST BIOPSY Left    2016   CARDIAC CATHETERIZATION N/A 11/17/2014   Procedure: Left Heart Cath and Coronary Angiography;  Surgeon: Wellington Hampshire, MD;  Location: [place] [facility];  Service: Cardiovascular;  Laterality: N/A;   CARDIAC CATHETERIZATION N/A 11/17/2014   Procedure: Coronary Stent Intervention;  Surgeon: Wellington Hampshire, MD;  Location: [place] [facility];  Service: Cardiovascular;  Laterality: N/A;  distal lad 2.25x20 promus   CATARACT EXTRACTION  W/PHACO Left 09/20/2016   Procedure: CATARACT EXTRACTION PHACO AND INTRAOCULAR LENS PLACEMENT (IOC);  Surgeon: Tonny Branch, MD;  Location: [facility];  Service: Ophthalmology;  Laterality: Left;  CDE: 9.27   CATARACT EXTRACTION W/PHACO Right 10/18/2016   Procedure: CATARACT EXTRACTION PHACO AND INTRAOCULAR LENS PLACEMENT (IOC);  Surgeon: Tonny Branch, MD;  Location: [facility];  Service: Ophthalmology;  Laterality: Right;  CDE: 6.38   CHOLECYSTECTOMY     COLONOSCOPY WITH PROPOFOL N/A 02/16/2021   Procedure: COLONOSCOPY WITH PROPOFOL;  Surgeon: Eloise Harman, DO;  Location[facility];  Service: Endoscopy;  Laterality: N/A;  1:30 / ASA 3   POLYPECTOMY  02/16/2021   Procedure: POLYPECTOMY;  Surgeon: Eloise Harman, DO;  [facility]: [facility];  Service: Endoscopy;;   SPINAL FUSION     cervical; screws and plates.   TOTAL ABDOMINAL HYSTERECTOMY  1980   Social History   Tobacco Use   Smoking status: Every Day    Packs/day: 0.50    Years: 30.00    Total pack years: 15.00    Types: Cigarettes    Start date: 12/26/1972   Smokeless tobacco: Never   Tobacco comments:    3 cigs per day  Vaping Use   Vaping Use: Never used  Substance Use Topics   Alcohol use: No    Alcohol/week: 0.0 standard drinks of alcohol   Drug use: No   Family History  Problem Relation Age of Onset   Heart attack Father    Heart disease Father    Cancer Brother    Allergies  Allergen Reactions   Codeine Nausea And Vomiting   Sulfa Antibiotics Rash   Review of Systems  Constitutional:  Positive for malaise/fatigue. Negative for chills and fever.  HENT:  Negative for congestion, ear pain, sinus pain and sore throat.   Respiratory:  Positive for shortness of breath.   Cardiovascular:  Negative for chest pain.  All other systems reviewed and are negative.    Objective:     BP 118/67   Pulse 75   Ht '5\' 4"'$  (1.626 m)   Wt 238 lb 3.2 oz (108 kg)   SpO2 92%   BMI 40.89 kg/m  BP Readings from Last 3  Encounters:  01/06/22 118/67  12/07/21 125/70  12/04/21 130/72   Physical Exam Vitals reviewed.  Constitutional:      Appearance: Normal appearance. She is obese.  HENT:     Head: Normocephalic and atraumatic.     Right Ear: External ear normal.     Left Ear: External ear normal.     Nose: Nose normal. No congestion or rhinorrhea.     Mouth/Throat:     Mouth: Mucous membranes are moist.     Pharynx: Oropharynx is clear. No oropharyngeal exudate or posterior oropharyngeal erythema.  Eyes:     General: No scleral icterus.    Extraocular Movements: Extraocular movements intact.     Conjunctiva/sclera: Conjunctivae normal.     Pupils: Pupils are equal, round, and reactive to light.  Cardiovascular:     Rate and Rhythm: Normal rate and regular rhythm.     Pulses: Normal pulses.     Heart sounds: Normal heart sounds. No murmur heard.    No friction rub. No gallop.  Pulmonary:     Effort: Pulmonary effort is normal.     Breath sounds: Normal breath sounds. No wheezing, rhonchi or rales.  Abdominal:     General: Abdomen is flat. Bowel sounds are normal. There is no distension.     Palpations: Abdomen is soft.     Tenderness: There is no abdominal tenderness.  Musculoskeletal:        General: Normal range of motion.     Cervical back: Normal range of motion.     Right lower leg: No edema.     Left lower leg: No edema.  Skin:    General: Skin is warm and dry.     Capillary Refill: Capillary refill takes less than 2 seconds.     Coloration: Skin is not jaundiced.  Neurological:     General: No focal deficit present.     Mental Status: She is alert and oriented to person, place, and time.  Psychiatric:        Mood and Affect: Mood normal.        Behavior: Behavior normal.    Last CBC Lab Results  Component Value Date   WBC 6.3 11/22/2021   HGB 14.8 11/22/2021   HCT 46.4 (H) 11/22/2021   MCV 93.9 11/22/2021   MCH 30.0 11/22/2021   RDW 15.3 11/22/2021   PLT 232  37/62/8315   Last metabolic panel Lab Results  Component Value Date   GLUCOSE 113 (H) 11/22/2021   NA 141 11/22/2021   K 4.0 11/22/2021   CL 107 11/22/2021   CO2 25 11/22/2021   BUN 20 11/22/2021   CREATININE 0.83 11/22/2021   GFRNONAA >  60 11/22/2021   CALCIUM 8.9 11/22/2021   PHOS 4.9 (H) 11/22/2021   PROT 6.6 11/22/2021   ALBUMIN 3.5 11/22/2021   LABGLOB 2.8 11/03/2021   AGRATIO 1.5 11/03/2021   BILITOT 0.6 11/22/2021   ALKPHOS 75 11/22/2021   AST 17 11/22/2021   ALT 21 11/22/2021   ANIONGAP 9 11/22/2021   Last lipids Lab Results  Component Value Date   CHOL 118 11/03/2021   HDL 40 11/03/2021   LDLCALC 60 11/03/2021   TRIG 97 11/03/2021   CHOLHDL 3.0 11/03/2021   Last hemoglobin A1c Lab Results  Component Value Date   HGBA1C 8.4 (H) 11/03/2021   Last thyroid functions Lab Results  Component Value Date   TSH 2.790 11/03/2021   Last vitamin D Lab Results  Component Value Date   VD25OH 61.8 11/03/2021     Assessment & Plan:   Problem List Items Addressed This Visit       Type 2 diabetes mellitus with hyperglycemia ([place])    Her most recent A1c was 8.4.  She is currently prescribed Tresiba 40 units twice daily, NovoLog 10 units with breakfast and sliding scale with lunch/dinner, metformin 1000 mg twice daily, and Farxiga 10 mg daily.  She regularly checks her blood sugar at home with a CGM.  AM readings have ranged 79-115.  She has had 2 readings > 200 recently, but is able to identify the cause.  She has also had 2 overnight blood sugar readings decreasing to 50s. -Given overnight low readings, I recommended that she not take NovoLog in the evening and that she decrease Tresiba to 30 units at night. -Her daytime readings seem well-controlled, accordingly I recommended that she switch to taking NovoLog only on a sliding scale basis -Through shared decision making, we will start Mounjaro 2.5 mg weekly injections today with a goal of decreasing her insulin  regimen -Follow-up in 4 weeks      Shortness of breath    Today she endorses a 2-day history of shortness of breath.  She has an extensive past medical history notable for COPD, OSA, CAD, and diastolic heart failure.  Pulmonary exam today is unremarkable.  She largely denies associated symptoms aside from fatigue. -Lasix has been refilled -I have ordered COVID/influenza testing today      Return in about 4 weeks (around 02/03/2022) for DM.    Johnette Abraham, MD

## 2022-01-06 NOTE — Patient Instructions (Signed)
It was a pleasure to see you today.  Thank you for giving [place] the opportunity to be involved in your care.  Below is a brief recap of your visit and next steps.  We will plan to see you again in 4 weeks.  Summary Add Mounjaro 2.5 mg weekly for diabetes Decrease Tresiba to 30 units nightly I recommend only using Novolog on a sliding scale basis with meals Follow up in 4 weeks

## 2022-01-06 NOTE — Assessment & Plan Note (Addendum)
Her most recent A1c was 8.4.  She is currently prescribed Tresiba 40 units twice daily, NovoLog 10 units with breakfast and sliding scale with lunch/dinner, metformin 1000 mg twice daily, and Farxiga 10 mg daily.  She regularly checks her blood sugar at home with a CGM.  AM readings have ranged 79-115.  She has had 2 readings > 200 recently, but is able to identify the cause.  She has also had 2 overnight blood sugar readings decreasing to 50s. -Given overnight low readings, I recommended that she not take NovoLog in the evening and that she decrease Tresiba to 30 units at night. -Her daytime readings seem well-controlled, accordingly I recommended that she switch to taking NovoLog only on a sliding scale basis -Through shared decision making, we will start Mounjaro 2.5 mg weekly injections today with a goal of decreasing her insulin regimen -Follow-up in 4 weeks

## 2022-01-06 NOTE — Assessment & Plan Note (Signed)
Today she endorses a 2-day history of shortness of breath.  She has an extensive past medical history notable for COPD, OSA, CAD, and diastolic heart failure.  Pulmonary exam today is unremarkable.  She largely denies associated symptoms aside from fatigue. -Lasix has been refilled -I have ordered COVID/influenza testing today

## 2022-01-08 LAB — COVID-19, FLU A+B NAA
Influenza A, NAA: NOT DETECTED
Influenza B, NAA: NOT DETECTED
SARS-CoV-2, NAA: NOT DETECTED

## 2022-01-08 LAB — HM DIABETES EYE EXAM

## 2022-01-11 ENCOUNTER — Ambulatory Visit: Payer: BC Managed Care – PPO

## 2022-01-11 ENCOUNTER — Telehealth: Payer: Self-pay | Admitting: Cardiology

## 2022-01-11 NOTE — Telephone Encounter (Signed)
Placed on lasix by pcp on 01/07/23  Called patient at number requested, got voicemail,lmtcb

## 2022-01-11 NOTE — Telephone Encounter (Signed)
Patient will call pcp,Dr.Dixon who put her on lasix 5 days ago and update him.She does not weigh self at home. She said her weight was from her office visit with him last week.

## 2022-01-11 NOTE — Telephone Encounter (Signed)
Pt c/o swelling: STAT is pt has developed SOB within 24 hours  If swelling, where is the swelling located? Face and chest   How much weight have you gained and in what time span? 14 lbs in a month   Have you gained 3 pounds in a day or 5 pounds in a week?   Do you have a log of your daily weights (if so, list)?   Are you currently taking a fluid pill? Yes   Are you currently SOB? No, started last Tuesday   Have you traveled recently? No   Pt states she has been swelling and wants to increase fluid pill   She states best callback: [phone]

## 2022-01-12 DIAGNOSIS — G4733 Obstructive sleep apnea (adult) (pediatric): Secondary | ICD-10-CM | POA: Diagnosis not present

## 2022-01-19 ENCOUNTER — Other Ambulatory Visit: Payer: Self-pay | Admitting: Internal Medicine

## 2022-01-31 ENCOUNTER — Ambulatory Visit
Admission: RE | Admit: 2022-01-31 | Discharge: 2022-01-31 | Disposition: A | Discharge status: Home / Self Care | Payer: BC Managed Care – PPO | Source: Ambulatory Visit | Attending: Nurse Practitioner | Admitting: Nurse Practitioner

## 2022-01-31 VITALS — BP 97/63 | HR 74 | Temp 98.2°F | Resp 20

## 2022-01-31 DIAGNOSIS — Z8709 Personal history of other diseases of the respiratory system: Secondary | ICD-10-CM

## 2022-01-31 DIAGNOSIS — J069 Acute upper respiratory infection, unspecified: Secondary | ICD-10-CM | POA: Diagnosis not present

## 2022-01-31 MED ORDER — ALBUTEROL SULFATE HFA 108 (90 BASE) MCG/ACT IN AERS: 2.0000 | INHALATION_SPRAY | Freq: Four times a day (QID) | RESPIRATORY_TRACT | 2 refills | Status: DC | PRN

## 2022-01-31 MED ORDER — GUAIFENESIN 100 MG/5ML PO LIQD: 5.0000 mL | ORAL | 0 refills | Status: AC | PRN

## 2022-01-31 MED ORDER — PREDNISONE 20 MG PO TABS: 40.0000 mg | ORAL_TABLET | Freq: Every day | ORAL | 0 refills | Status: AC

## 2022-01-31 NOTE — Discharge Instructions (Signed)
Take medication as prescribed. Increase fluids and allow for plenty of rest. Recommend Tylenol as needed for pain, fever, or general discomfort. Recommend using a humidifier at bedtime during sleep to help with cough and nasal congestion. Sleep elevated on 2 pillows. As discussed, a viral infection can persist from 7 to 10 days.  If symptoms suddenly worsen before that time, or extend beyond that time, please follow-up with your primary care physician for further evaluation. Follow-up as needed. Follow-up if your symptoms do not improve.

## 2022-01-31 NOTE — ED Triage Notes (Signed)
Pt reports cough, chills, shortness of breath, nasal congestion and chest congestion congestion x 2 days. OTC sinus meds gives no relief

## 2022-01-31 NOTE — ED Provider Notes (Signed)
[facility]    CSN: [id number] Arrival date & time: 01/31/22  2025      History   Chief Complaint Chief Complaint  Patient presents with   Chills    Cough congestion achy - Entered by patient   Appointment    1000    HPI [name] is a 67 y.o. female.   The history is provided by the patient.   Patient with a 2-day history of cough, chills, shortness of breath, nasal congestion, and chest congestion.  Patient denies fever, chills, wheezing, difficulty breathing, or GI symptoms.  She has been taking over-the-counter sinus medications with minimal relief.  Patient reports that she did take a home COVID test which was negative.  Patient also reports history of COPD.  Past Medical History:  Diagnosis Date   Anxiety    Aortic stenosis    CAD (coronary artery disease)    a. 10/2014: Ant STEMI s/p DES to dLAD   Carotid disease, bilateral ([place])    a. Duplex 12/2014: mild-mod atherosclerotic plaque without hemodynamically significant stenosis.   COPD (chronic obstructive pulmonary disease) (HCC)    Essential hypertension    HLD (hyperlipidemia)    Morbid obesity (HCC)    Myocardial infarction ([place]) 11/17/2014   PONV (postoperative nausea and vomiting)    Shingles    Sleep apnea    Uses CPAP   Type 2 diabetes mellitus [facility])     Patient Active Problem List   Diagnosis Date Noted   Shortness of breath 01/06/2022   Encounter for general adult medical examination with abnormal findings 12/07/2021   Carotid artery stenosis 11/22/2021   Syncope 11/21/2021   Type 2 diabetes mellitus with hyperglycemia ([place]) 11/21/2021   Prolonged QT interval 11/21/2021   Sleep apnea 11/21/2021   H/O total hysterectomy 11/05/2021   Encounter to establish care 42/70/6237   Acute diastolic CHF (congestive heart failure) ([place]) 08/27/2018   Acute respiratory failure with hypoxia (HCC) 08/27/2018   Pleural effusion due to CHF (congestive heart failure) ([place]) 08/27/2018    Hypokalemia 08/27/2018   Abdominal wall cellulitis 08/25/2018   Anasarca 08/25/2018   Hypoxia 08/25/2018   Backache 08/25/2018   Anxiety 08/25/2018   Morbid obesity ([place]) 01/14/2015   CAD (coronary artery disease)    COPD (chronic obstructive pulmonary disease) ([place])    Essential hypertension    DM type 2 causing vascular disease ([place])    Current smoker    Mixed hyperlipidemia    Aortic stenosis    ST elevation (STEMI) myocardial infarction involving left anterior descending coronary artery ([place]) 11/17/2014    Past Surgical History:  Procedure Laterality Date   BREAST BIOPSY Left    2016   CARDIAC CATHETERIZATION N/A 11/17/2014   Procedure: Left Heart Cath and Coronary Angiography;  Surgeon: Wellington Hampshire, MD;  Location: [place] [facility];  Service: Cardiovascular;  Laterality: N/A;   CARDIAC CATHETERIZATION N/A 11/17/2014   Procedure: Coronary Stent Intervention;  Surgeon: Wellington Hampshire, MD;  Location: [place] [facility];  Service: Cardiovascular;  Laterality: N/A;  distal lad 2.25x20 promus   CATARACT EXTRACTION W/PHACO Left 09/20/2016   Procedure: CATARACT EXTRACTION PHACO AND INTRAOCULAR LENS PLACEMENT (IOC);  Surgeon: Tonny Branch, MD;  Location: [facility];  Service: Ophthalmology;  Laterality: Left;  CDE: 9.27   CATARACT EXTRACTION W/PHACO Right 10/18/2016   Procedure: CATARACT EXTRACTION PHACO AND INTRAOCULAR LENS PLACEMENT (IOC);  Surgeon: Tonny Branch, MD;  Location: [facility];  Service: Ophthalmology;  Laterality: Right;  CDE: 6.38   CHOLECYSTECTOMY     COLONOSCOPY WITH PROPOFOL N/A 02/16/2021   Procedure: COLONOSCOPY WITH PROPOFOL;  Surgeon: Eloise Harman, DO;  [facility];  Service: Endoscopy;  Laterality: N/A;  1:30 / ASA 3   POLYPECTOMY  02/16/2021   Procedure: POLYPECTOMY;  Surgeon: Eloise Harman, DO;  [facility];  Service: Endoscopy;;   SPINAL FUSION     cervical; screws and plates.   TOTAL ABDOMINAL HYSTERECTOMY  1980    OB  History     Gravida  1   Para  1   Term  1   Preterm      AB      Living  1      SAB      IAB      Ectopic      Multiple      Live Births               Home Medications    Prior to Admission medications   Medication Sig Start Date End Date Taking? Authorizing Provider  albuterol (VENTOLIN HFA) 108 (90 Base) MCG/ACT inhaler Inhale 2 puffs into the lungs every 6 (six) hours as needed for wheezing or shortness of breath. 01/31/22  Yes Evelyne Makepeace-Warren, Alda Lea, NP  guaiFENesin (ROBITUSSIN) 100 MG/5ML liquid Take 5 mLs by mouth every 4 (four) hours as needed for up to 10 days for cough or to loosen phlegm. 01/31/22 02/10/22 Yes Azarya Oconnell-Warren, Alda Lea, NP  predniSONE (DELTASONE) 20 MG tablet Take 2 tablets (40 mg total) by mouth daily with breakfast for 5 days. 01/31/22 02/05/22 Yes Deandrea Rion-Warren, Alda Lea, NP  aspirin EC 81 MG tablet Take 81 mg by mouth daily.    [provider]  atorvastatin (LIPITOR) 40 MG tablet Take 1 tablet (40 mg total) by mouth every other day. Take at dinner 11/22/21   Johnson, Clanford L, MD  clonazePAM (KLONOPIN) 0.5 MG tablet Take 0.5 mg by mouth daily as needed for anxiety. 12/29/20   [provider]  Continuous Blood Gluc Sensor (FREESTYLE LIBRE 3 SENSOR) MISC 1 each by Does not apply route every 14 (fourteen) days. Place 1 sensor on the skin every 14 days. Use to check glucose continuously 01/06/22   Johnette Abraham, MD  dapagliflozin propanediol (FARXIGA) 10 MG TABS tablet Take 10 mg by mouth daily.    [provider]  escitalopram (LEXAPRO) 20 MG tablet Take 20 mg by mouth daily at 6 (six) AM.    [provider]  fluconazole (DIFLUCAN) 150 MG tablet TAKE 1 TABLET BY MOUTH EVERY DAY 01/20/22   Johnette Abraham, MD  fluticasone (FLONASE) 50 MCG/ACT nasal spray Place 1 spray into both nostrils daily. 10/24/20   Lamptey, Myrene Galas, MD  furosemide (LASIX) 20 MG tablet Take 1 tablet (20 mg total) by mouth daily.  01/06/22   Johnette Abraham, MD  ibuprofen (ADVIL) 600 MG tablet Take 1 tablet (600 mg total) by mouth every 6 (six) hours as needed. Patient taking differently: Take 600 mg by mouth every 6 (six) hours as needed for headache. 11/13/20   Melynda Ripple, MD  isosorbide mononitrate (IMDUR) 30 MG 24 hr tablet TAKE 1 TABLET BY MOUTH EVERY DAY Patient taking differently: Take 30 mg by mouth daily. 07/14/21   Satira Sark, MD  ketoconazole (NIZORAL) 2 % cream Apply 1 application topically daily as needed for rash. 01/23/21   [provider]  lisinopril (ZESTRIL) 2.5 MG  tablet Take 2.5 mg by mouth daily.    [provider]  metFORMIN (GLUCOPHAGE) 1000 MG tablet Take 1,000 mg by mouth 2 (two) times daily.  09/26/14   [provider]  metoprolol succinate (TOPROL XL) 25 MG 24 hr tablet Take 0.5 tablets (12.5 mg total) by mouth daily. 11/22/21   Johnson, Clanford L, MD  NOVOLOG FLEXPEN 100 UNIT/ML FlexPen Inject 10 Units into the skin 2 (two) times daily. 01/28/21   [provider]  nystatin (MYCOSTATIN/NYSTOP) powder Apply 1 application topically daily as needed for rash. 01/23/21   [provider]  SYMBICORT 160-4.5 MCG/ACT inhaler Inhale 2 puffs into the lungs daily. 12/07/21   Johnette Abraham, MD  tirzepatide [facility]) 2.5 MG/0.5ML Pen Inject 2.5 mg into the skin once a week for 28 days. 01/06/22 02/03/22  Johnette Abraham, MD  TRESIBA FLEXTOUCH 200 UNIT/ML FlexTouch Pen Inject 40 Units into the skin daily. 11/22/21   Murlean Iba, MD    Family History Family History  Problem Relation Age of Onset   Heart attack Father    Heart disease Father    Cancer Brother     Social History Social History   Tobacco Use   Smoking status: Every Day    Packs/day: 0.50    Years: 30.00    Total pack years: 15.00    Types: Cigarettes    Start date: 12/26/1972   Smokeless tobacco: Never   Tobacco comments:    3 cigs per day  Vaping Use   Vaping  Use: Never used  Substance Use Topics   Alcohol use: No    Alcohol/week: 0.0 standard drinks of alcohol   Drug use: No     Allergies   Codeine and Sulfa antibiotics   Review of Systems Review of Systems Per HPI  Physical Exam Triage Vital Signs ED Triage Vitals [01/31/22 1000]  Enc Vitals Group     BP 97/63     Pulse Rate 74     Resp 20     Temp 98.2 F (36.8 C)     Temp Source Oral     SpO2 97 %     Weight      Height      Head Circumference      Peak Flow      Pain Score 0     Pain Loc      Pain Edu?      Excl. in [place]?    No data found.  Updated Vital Signs BP 97/63 (BP Location: Right Arm)   Pulse 74   Temp 98.2 F (36.8 C) (Oral)   Resp 20   SpO2 97%   Visual Acuity Right Eye Distance:   Left Eye Distance:   Bilateral Distance:    Right Eye Near:   Left Eye Near:    Bilateral Near:     Physical Exam   UC Treatments / Results  Labs (all labs ordered are listed, but only abnormal results are displayed) Labs Reviewed - No data to display  EKG   Radiology No results found.  Procedures Procedures (including critical care time)  Medications Ordered in UC Medications - No data to display  Initial Impression / Assessment and Plan / UC Course  I have reviewed the triage vital signs and the nursing notes.  Pertinent labs & imaging results that were available during my care of the patient were reviewed by me and considered in my medical decision making (see chart for  details).  The patient is well-appearing, she is in no acute distress, vital signs are stable.  Suspect a viral upper respiratory infection with cough.  Patient took a home COVID test which was negative.  Recommend guaifenesin for her cough symptoms along with an albuterol inhaler for shortness of breath.  Given her underlying history of COPD.  Will start patient on prednisone 40 mg for 5 days.  Supportive care recommendations were provided to the patient to include increasing  fluids, allowing for plenty of rest, use of a humidifier.  Review of patient's chart shows that she should have a follow-up appointment with her PCP sometime this month.  Discussed indications of when follow-up may be necessary.  Patient verbalizes understanding.  All questions were answered.  Patient stable for discharge.  Final Clinical Impressions(s) / UC Diagnoses   Final diagnoses:  Viral upper respiratory tract infection with cough  History of COPD     Discharge Instructions      Take medication as prescribed. Increase fluids and allow for plenty of rest. Recommend Tylenol as needed for pain, fever, or general discomfort. Recommend using a humidifier at bedtime during sleep to help with cough and nasal congestion. Sleep elevated on 2 pillows. As discussed, a viral infection can persist from 7 to 10 days.  If symptoms suddenly worsen before that time, or extend beyond that time, please follow-up with your primary care physician for further evaluation. Follow-up as needed. Follow-up if your symptoms do not improve.      ED Prescriptions     Medication Sig Dispense Auth. Provider   guaiFENesin (ROBITUSSIN) 100 MG/5ML liquid Take 5 mLs by mouth every 4 (four) hours as needed for up to 10 days for cough or to loosen phlegm. 200 mL Raha Tennison-Warren, Alda Lea, NP   predniSONE (DELTASONE) 20 MG tablet Take 2 tablets (40 mg total) by mouth daily with breakfast for 5 days. 10 tablet Kenta Laster-Warren, Alda Lea, NP   albuterol (VENTOLIN HFA) 108 (90 Base) MCG/ACT inhaler Inhale 2 puffs into the lungs every 6 (six) hours as needed for wheezing or shortness of breath. 8 g Khiree Bukhari-Warren, Alda Lea, NP      PDMP not reviewed this encounter.   Tish Men, NP 01/31/22 1026

## 2022-02-03 ENCOUNTER — Ambulatory Visit (INDEPENDENT_AMBULATORY_CARE_PROVIDER_SITE_OTHER): Payer: BC Managed Care – PPO | Admitting: Internal Medicine

## 2022-02-03 ENCOUNTER — Encounter: Payer: Self-pay | Admitting: Internal Medicine

## 2022-02-03 VITALS — BP 106/66 | HR 74 | Ht 64.0 in | Wt 232.0 lb

## 2022-02-03 DIAGNOSIS — E1165 Type 2 diabetes mellitus with hyperglycemia: Secondary | ICD-10-CM

## 2022-02-03 DIAGNOSIS — E1159 Type 2 diabetes mellitus with other circulatory complications: Secondary | ICD-10-CM | POA: Diagnosis not present

## 2022-02-03 DIAGNOSIS — Z794 Long term (current) use of insulin: Secondary | ICD-10-CM

## 2022-02-03 MED ORDER — TRESIBA FLEXTOUCH 200 UNIT/ML ~~LOC~~ SOPN: 26.0000 [IU] | PEN_INJECTOR | Freq: Every day | SUBCUTANEOUS | Status: DC

## 2022-02-03 MED ORDER — FREESTYLE LIBRE 2 READER DEVI: 1.0000 | 0 refills | Status: DC

## 2022-02-03 MED ORDER — FREESTYLE LIBRE 2 SENSOR MISC: 1.0000 | 3 refills | Status: DC

## 2022-02-03 NOTE — Progress Notes (Signed)
Established Patient Office Visit  Subjective   Patient ID: MARICEL SWARTZENDRUBER, female    DOB: 1955-11-05  Age: 67 y.o. MRN: [id number]  Chief Complaint  Patient presents with   Congestive Heart Failure    Follow up   Diabetes    Follow up   Ms. Arteaga returns today for DM follow-up.  She was last seen by me on 01/06/22 at which time Darcel Bayley was started.  I also recommended that she decrease Tresiba to 30 units nightly and only take NovoLog on a sliding scale basis.  4-week follow-up was arranged.  In the interim she presented to the emergency department on 1/7 endorsing a 2-day history of cough, chills, shortness of breath, and congestion.  COVID/influenza testing was negative.  There have otherwise been no acute interval events.  Today Ms. Mozer continues to endorse upper respiratory symptoms.  She states that she feels tired.  Overall her symptoms are gradually improving.  She is otherwise asymptomatic and has no acute concerns to discuss.  She administered her first Mounjaro injection on Sunday (1/7).  She has not experienced any adverse side effects.  Past Medical History:  Diagnosis Date   Anxiety    Aortic stenosis    CAD (coronary artery disease)    a. 10/2014: Ant STEMI s/p DES to dLAD   Carotid disease, bilateral ([place])    a. Duplex 12/2014: mild-mod atherosclerotic plaque without hemodynamically significant stenosis.   COPD (chronic obstructive pulmonary disease) (HCC)    Essential hypertension    HLD (hyperlipidemia)    Morbid obesity (HCC)    Myocardial infarction ([place]) 11/17/2014   PONV (postoperative nausea and vomiting)    Shingles    Sleep apnea    Uses CPAP   Type 2 diabetes mellitus [facility])    Past Surgical History:  Procedure Laterality Date   BREAST BIOPSY Left    2016   CARDIAC CATHETERIZATION N/A 11/17/2014   Procedure: Left Heart Cath and Coronary Angiography;  Surgeon: Wellington Hampshire, MD;  Location: [place] [facility];  Service: Cardiovascular;   Laterality: N/A;   CARDIAC CATHETERIZATION N/A 11/17/2014   Procedure: Coronary Stent Intervention;  Surgeon: Wellington Hampshire, MD;  Location: [place] [facility];  Service: Cardiovascular;  Laterality: N/A;  distal lad 2.25x20 promus   CATARACT EXTRACTION W/PHACO Left 09/20/2016   Procedure: CATARACT EXTRACTION PHACO AND INTRAOCULAR LENS PLACEMENT (IOC);  Surgeon: Tonny Branch, MD;  Location: [facility];  Service: Ophthalmology;  Laterality: Left;  CDE: 9.27   CATARACT EXTRACTION W/PHACO Right 10/18/2016   Procedure: CATARACT EXTRACTION PHACO AND INTRAOCULAR LENS PLACEMENT (IOC);  Surgeon: Tonny Branch, MD;  Location: [facility];  Service: Ophthalmology;  Laterality: Right;  CDE: 6.38   CHOLECYSTECTOMY     COLONOSCOPY WITH PROPOFOL N/A 02/16/2021   Procedure: COLONOSCOPY WITH PROPOFOL;  Surgeon: Eloise Harman, DO;  Location: [facility];  Service: Endoscopy;  Laterality: N/A;  1:30 / ASA 3   POLYPECTOMY  02/16/2021   Procedure: POLYPECTOMY;  Surgeon: Eloise Harman, DO;  Location: [facility];  Service: Endoscopy;;   SPINAL FUSION     cervical; screws and plates.   TOTAL ABDOMINAL HYSTERECTOMY  1980   Social History   Tobacco Use   Smoking status: Every Day    Packs/day: 0.50    Years: 30.00    Total pack years: 15.00    Types: Cigarettes    Start date: 12/26/1972   Smokeless tobacco: Never   Tobacco comments:  3 cigs per day  Vaping Use   Vaping Use: Never used  Substance Use Topics   Alcohol use: No    Alcohol/week: 0.0 standard drinks of alcohol   Drug use: No   Family History  Problem Relation Age of Onset   Heart attack Father    Heart disease Father    Cancer Brother    Allergies  Allergen Reactions   Codeine Nausea And Vomiting   Sulfa Antibiotics Rash   Review of Systems  Constitutional:  Positive for malaise/fatigue.  HENT:  Positive for congestion.   Respiratory:  Positive for cough. Negative for sputum production and shortness of breath.   All other  systems reviewed and are negative.    Objective:     BP 106/66   Pulse 74   Ht '5\' 4"'$  (1.626 m)   Wt 232 lb (105.2 kg)   SpO2 (!) 89%   BMI 39.82 kg/m  BP Readings from Last 3 Encounters:  02/03/22 106/66  01/31/22 97/63  01/06/22 118/67   Physical Exam Vitals reviewed.  Constitutional:      General: She is not in acute distress.    Appearance: Normal appearance. She is obese. She is not toxic-appearing.  HENT:     Head: Normocephalic and atraumatic.     Right Ear: External ear normal.     Left Ear: External ear normal.     Nose: Congestion present. No rhinorrhea.     Mouth/Throat:     Mouth: Mucous membranes are moist.     Pharynx: Oropharynx is clear. No oropharyngeal exudate or posterior oropharyngeal erythema.  Eyes:     General: No scleral icterus.    Extraocular Movements: Extraocular movements intact.     Conjunctiva/sclera: Conjunctivae normal.     Pupils: Pupils are equal, round, and reactive to light.  Cardiovascular:     Rate and Rhythm: Normal rate and regular rhythm.     Pulses: Normal pulses.     Heart sounds: Normal heart sounds. No murmur heard.    No friction rub. No gallop.  Pulmonary:     Effort: Pulmonary effort is normal.     Breath sounds: Normal breath sounds. No wheezing, rhonchi or rales.  Abdominal:     General: Abdomen is flat. Bowel sounds are normal. There is no distension.     Palpations: Abdomen is soft.     Tenderness: There is no abdominal tenderness.  Musculoskeletal:        General: No swelling. Normal range of motion.     Cervical back: Normal range of motion.     Right lower leg: No edema.     Left lower leg: No edema.  Lymphadenopathy:     Cervical: No cervical adenopathy.  Skin:    General: Skin is warm and dry.     Capillary Refill: Capillary refill takes less than 2 seconds.     Coloration: Skin is not jaundiced.  Neurological:     General: No focal deficit present.     Mental Status: She is alert and oriented to  person, place, and time.  Psychiatric:        Mood and Affect: Mood normal.        Behavior: Behavior normal.    Last CBC Lab Results  Component Value Date   WBC 6.3 11/22/2021   HGB 14.8 11/22/2021   HCT 46.4 (H) 11/22/2021   MCV 93.9 11/22/2021   MCH 30.0 11/22/2021   RDW 15.3 11/22/2021   PLT 232  84/53/6468   Last metabolic panel Lab Results  Component Value Date   GLUCOSE 113 (H) 11/22/2021   NA 141 11/22/2021   K 4.0 11/22/2021   CL 107 11/22/2021   CO2 25 11/22/2021   BUN 20 11/22/2021   CREATININE 0.83 11/22/2021   GFRNONAA >60 11/22/2021   CALCIUM 8.9 11/22/2021   PHOS 4.9 (H) 11/22/2021   PROT 6.6 11/22/2021   ALBUMIN 3.5 11/22/2021   LABGLOB 2.8 11/03/2021   AGRATIO 1.5 11/03/2021   BILITOT 0.6 11/22/2021   ALKPHOS 75 11/22/2021   AST 17 11/22/2021   ALT 21 11/22/2021   ANIONGAP 9 11/22/2021   Last lipids Lab Results  Component Value Date   CHOL 118 11/03/2021   HDL 40 11/03/2021   LDLCALC 60 11/03/2021   TRIG 97 11/03/2021   CHOLHDL 3.0 11/03/2021   Last hemoglobin A1c Lab Results  Component Value Date   HGBA1C 8.4 (H) 11/03/2021   Last thyroid functions Lab Results  Component Value Date   TSH 2.790 11/03/2021   Last vitamin D Lab Results  Component Value Date   VD25OH 61.8 11/03/2021     Assessment & Plan:   Problem List Items Addressed This Visit       Type 2 diabetes mellitus with hyperglycemia ([place])    Returning to care today for DM follow-up.  Mounjaro 2.5 mg weekly injections were prescribed at her last appointment.  I also recommended that she decrease Tresiba to 30 units nightly due to overnight low readings.  I also recommended that she not take NovoLog in the evenings and that she switch to using NovoLog on a sliding scale basis with meals.  She has completed one Mounjaro injection and has not experienced any adverse side effects.  She reports that she has decreased [place] to 30 units and has not needed to use NovoLog.  Her  AM blood sugar readings have been 118-120.  Lunchtime readings have been 80-110.  Her 30-day average blood sugar has been 121. -She was congratulated on her progress today -We will plan to increase Mounjaro to 5 mg weekly once she completes 4 injections of 2.5 mg. -NovoLog has been discontinued today -I recommended that she gradually reduce [place] based on AM blood sugar readings.  I recommended that she decrease to 26 units as long as her blood sugar readings stay within goal (90-130). -Continue metformin and Wilder Glade as previously prescribed -We will plan for follow-up in 6 weeks and repeat A1c at that time -She has also requested to return to using a freestyle libre 2 device as she has troubles with the needles used for third-generation.       Return in about 6 weeks (around 03/17/2022) for DM.    Johnette Abraham, MD

## 2022-02-03 NOTE — Patient Instructions (Signed)
It was a pleasure to see you today.  Thank you for giving [place] the opportunity to be involved in your care.  Below is a brief recap of your visit and next steps.  We will plan to see you again in 6 weeks.  Summary Decrease Tresiba to 26 units nightly. Can decrease to 22 units after 3 days if AM sugars remain within goal (90-130) Please notify us when you are due to increase Mounjaro to 5 mg weekly We will follow up in 6 weeks

## 2022-02-03 NOTE — Assessment & Plan Note (Addendum)
Returning to care today for DM follow-up.  Mounjaro 2.5 mg weekly injections were prescribed at her last appointment.  I also recommended that she decrease Tresiba to 30 units nightly due to overnight low readings.  I also recommended that she not take NovoLog in the evenings and that she switch to using NovoLog on a sliding scale basis with meals.  She has completed one Mounjaro injection and has not experienced any adverse side effects.  She reports that she has decreased [place] to 30 units and has not needed to use NovoLog.  Her AM blood sugar readings have been 118-120.  Lunchtime readings have been 80-110.  Her 30-day average blood sugar has been 121. -She was congratulated on her progress today -We will plan to increase Mounjaro to 5 mg weekly once she completes 4 injections of 2.5 mg. -NovoLog has been discontinued today -I recommended that she gradually reduce [place] based on AM blood sugar readings.  I recommended that she decrease to 26 units as long as her blood sugar readings stay within goal (90-130). -Continue metformin and Wilder Glade as previously prescribed -We will plan for follow-up in 6 weeks and repeat A1c at that time -She has also requested to return to using a freestyle libre 2 device as she has troubles with the needles used for third-generation.

## 2022-02-05 ENCOUNTER — Other Ambulatory Visit: Payer: Self-pay | Admitting: Internal Medicine

## 2022-02-05 DIAGNOSIS — E1159 Type 2 diabetes mellitus with other circulatory complications: Secondary | ICD-10-CM

## 2022-02-05 MED ORDER — TIRZEPATIDE 5 MG/0.5ML ~~LOC~~ SOAJ: 5.0000 mg | SUBCUTANEOUS | 1 refills | Status: DC

## 2022-02-12 DIAGNOSIS — G4733 Obstructive sleep apnea (adult) (pediatric): Secondary | ICD-10-CM | POA: Diagnosis not present

## 2022-03-05 ENCOUNTER — Other Ambulatory Visit: Payer: Self-pay

## 2022-03-05 DIAGNOSIS — E1159 Type 2 diabetes mellitus with other circulatory complications: Secondary | ICD-10-CM

## 2022-03-08 ENCOUNTER — Encounter: Payer: Self-pay | Admitting: Internal Medicine

## 2022-03-08 ENCOUNTER — Other Ambulatory Visit: Payer: Self-pay

## 2022-03-08 MED ORDER — METFORMIN HCL 1000 MG PO TABS: 1000.0000 mg | ORAL_TABLET | Freq: Two times a day (BID) | ORAL | 0 refills | Status: DC

## 2022-03-11 DIAGNOSIS — M5416 Radiculopathy, lumbar region: Secondary | ICD-10-CM | POA: Diagnosis not present

## 2022-03-15 ENCOUNTER — Other Ambulatory Visit: Payer: Self-pay | Admitting: Internal Medicine

## 2022-03-15 DIAGNOSIS — J449 Chronic obstructive pulmonary disease, unspecified: Secondary | ICD-10-CM

## 2022-03-15 DIAGNOSIS — G4733 Obstructive sleep apnea (adult) (pediatric): Secondary | ICD-10-CM | POA: Diagnosis not present

## 2022-03-15 MED ORDER — ALBUTEROL SULFATE HFA 108 (90 BASE) MCG/ACT IN AERS: 2.0000 | INHALATION_SPRAY | Freq: Four times a day (QID) | RESPIRATORY_TRACT | 3 refills | Status: DC | PRN

## 2022-03-16 ENCOUNTER — Ambulatory Visit: Payer: BC Managed Care – PPO | Attending: Cardiology | Admitting: Cardiology

## 2022-03-16 ENCOUNTER — Encounter: Payer: Self-pay | Admitting: Cardiology

## 2022-03-16 VITALS — BP 130/60 | HR 76 | Ht 64.0 in | Wt 213.4 lb

## 2022-03-16 DIAGNOSIS — I471 Supraventricular tachycardia, unspecified: Secondary | ICD-10-CM

## 2022-03-16 DIAGNOSIS — I350 Nonrheumatic aortic (valve) stenosis: Secondary | ICD-10-CM

## 2022-03-16 DIAGNOSIS — I25119 Atherosclerotic heart disease of native coronary artery with unspecified angina pectoris: Secondary | ICD-10-CM

## 2022-03-16 NOTE — Progress Notes (Signed)
Cardiology Office Note  Date: 03/16/2022   ID: Anderson, Dannunzio 1955/06/17, MRN [id number]  PCP:  Johnette Abraham, MD  Cardiologist:  Rozann Lesches, MD Electrophysiologist:  None   Chief Complaint  Patient presents with   Cardiac follow-up    History of Present Illness: [name] is a 67 y.o. female last seen in November 2023.  She is here for a follow-up visit.  In the interim reports no recurrent syncope, no exertional chest pain or palpitations.  She has been following with PCP for management of type 2 diabetes mellitus.  I reviewed her medications, she is also on Mounjaro and has lost at least 20 pounds.  She is due for follow-up hemoglobin A1c.  I reviewed her most recent lab work, last LDL was 60 on Lipitor.  Remainder of her cardiac regimen is stable.  Past Medical History:  Diagnosis Date   Anxiety    Aortic stenosis    CAD (coronary artery disease)    a. 10/2014: Ant STEMI s/p DES to dLAD   Carotid disease, bilateral ([place])    a. Duplex 12/2014: mild-mod atherosclerotic plaque without hemodynamically significant stenosis.   COPD (chronic obstructive pulmonary disease) (HCC)    Essential hypertension    HLD (hyperlipidemia)    Morbid obesity (HCC)    Myocardial infarction ([place]) 11/17/2014   PONV (postoperative nausea and vomiting)    Shingles    Sleep apnea    Uses CPAP   Type 2 diabetes mellitus (HCC)     Current Outpatient Medications  Medication Sig Dispense Refill   albuterol (VENTOLIN HFA) 108 (90 Base) MCG/ACT inhaler Inhale 2 puffs into the lungs every 6 (six) hours as needed for wheezing or shortness of breath. 8 g 3   aspirin EC 81 MG tablet Take 81 mg by mouth daily.     atorvastatin (LIPITOR) 40 MG tablet Take 1 tablet (40 mg total) by mouth every other day. Take at dinner 30 tablet 1   clonazePAM (KLONOPIN) 0.5 MG tablet Take 0.5 mg by mouth daily as needed for anxiety.     Continuous Blood Gluc Receiver (FREESTYLE LIBRE 2 READER) DEVI 1  each by Does not apply route continuous. 1 each 0   Continuous Blood Gluc Sensor (FREESTYLE LIBRE 2 SENSOR) MISC 1 each by Does not apply route every 14 (fourteen) days. 1 each 3   dapagliflozin propanediol (FARXIGA) 10 MG TABS tablet Take 10 mg by mouth daily.     escitalopram (LEXAPRO) 20 MG tablet Take 20 mg by mouth daily at 6 (six) AM.     fluconazole (DIFLUCAN) 150 MG tablet TAKE 1 TABLET BY MOUTH EVERY DAY 3 tablet 1   fluticasone (FLONASE) 50 MCG/ACT nasal spray Place 1 spray into both nostrils daily. 18 g 0   furosemide (LASIX) 20 MG tablet Take 1 tablet (20 mg total) by mouth daily. 90 tablet 3   ibuprofen (ADVIL) 600 MG tablet Take 1 tablet (600 mg total) by mouth every 6 (six) hours as needed. (Patient taking differently: Take 600 mg by mouth every 6 (six) hours as needed for headache.) 30 tablet 0   isosorbide mononitrate (IMDUR) 30 MG 24 hr tablet TAKE 1 TABLET BY MOUTH EVERY DAY (Patient taking differently: Take 30 mg by mouth daily.) 90 tablet 3   ketoconazole (NIZORAL) 2 % cream Apply 1 application topically daily as needed for rash.     lisinopril (ZESTRIL) 2.5 MG tablet Take 2.5 mg by mouth daily.  metFORMIN (GLUCOPHAGE) 1000 MG tablet Take 1 tablet (1,000 mg total) by mouth 2 (two) times daily. 180 tablet 0   metoprolol succinate (TOPROL XL) 25 MG 24 hr tablet Take 0.5 tablets (12.5 mg total) by mouth daily. 15 tablet 1   nystatin (MYCOSTATIN/NYSTOP) powder Apply 1 application topically daily as needed for rash.     SYMBICORT 160-4.5 MCG/ACT inhaler Inhale 2 puffs into the lungs daily. 6 g 3   tirzepatide (MOUNJARO) 5 MG/0.5ML Pen Inject 5 mg into the skin once a week. 6 mL 1   TRESIBA FLEXTOUCH 200 UNIT/ML FlexTouch Pen Inject 26 Units into the skin daily.     No current facility-administered medications for this visit.   Allergies:  Codeine and Sulfa antibiotics   ROS: No orthopnea or PND.  Physical Exam: VS:  BP 130/60   Pulse 76   Ht 5' 4"$  (1.626 m)   Wt 213 lb  6.4 oz (96.8 kg)   SpO2 91%   BMI 36.63 kg/m , BMI Body mass index is 36.63 kg/m.  Wt Readings from Last 3 Encounters:  03/16/22 213 lb 6.4 oz (96.8 kg)  02/03/22 232 lb (105.2 kg)  01/06/22 238 lb 3.2 oz (108 kg)    General: Patient appears comfortable at rest. HEENT: Conjunctiva and lids normal Neck: Supple, no elevated JVP or carotid bruits. Lungs: Clear to auscultation, nonlabored breathing at rest. Cardiac: Regular rate and rhythm, no S3, 2/6 systolic murmur.  ECG:  An ECG dated 11/21/2021 was personally reviewed today and demonstrated:  Sinus rhythm with rightward axis, nonspecific T wave changes.  Recent Labwork: 11/03/2021: TSH 2.790 11/22/2021: ALT 21; AST 17; BUN 20; Creatinine, Ser 0.83; Hemoglobin 14.8; Magnesium 2.2; Platelets 232; Potassium 4.0; Sodium 141     Component Value Date/Time   CHOL 118 11/03/2021 1126   TRIG 97 11/03/2021 1126   HDL 40 11/03/2021 1126   CHOLHDL 3.0 11/03/2021 1126   CHOLHDL 2.8 11/07/2015 1037   VLDL 31 (H) 11/07/2015 1037   LDLCALC 60 11/03/2021 1126    Other Studies Reviewed Today:  Cardiac monitor October 2023: ZIO XT reviewed.  13 days, 23 hours analyzed.   Predominant rhythm is sinus with heart rate ranging from 49 bpm up to 133 bpm and average heart rate 69 bpm. There were rare PACs including atrial couplets and triplets representing less than 1% total beats. There were rare PVCs including ventricular couplets and triplets representing less than 1% total beats. Multiple brief episodes of PSVT were noted, the longest of which was 18 beats in duration.  There was loose correlation between patient triggered events and PACs as well as PSVT. There were no sustained arrhythmias or pauses.   Echocardiogram 11/22/2021:  1. Abnormal septal motion . Left ventricular ejection fraction, by  estimation, is 55 to 60%. The left ventricle has normal function. The left  ventricle has no regional wall motion abnormalities. Left ventricular   diastolic parameters were normal.   2. Right ventricular systolic function is normal. The right ventricular  size is normal. There is severely elevated pulmonary artery systolic  pressure.   3. Left atrial size was mildly dilated.   4. The mitral valve is abnormal. No evidence of mitral valve  regurgitation. No evidence of mitral stenosis.   5. Gradient lower than seen on TTE done 08/28/18. The aortic valve is  tricuspid. There is moderate calcification of the aortic valve. There is  moderate thickening of the aortic valve. Aortic valve regurgitation is not  visualized.  Mild aortic valve  stenosis.   6. The inferior vena cava is dilated in size with >50% respiratory  variability, suggesting right atrial pressure of 8 mmHg.   Assessment and Plan:  1.  CAD status post prior anterior STEMI and DES to the LAD in 2018.  She reports no active angina at this time, LVEF 55 to 60% by echocardiogram in October 2023.  Continue with observation on medical therapy.  Currently on aspirin, Lipitor, Farxiga, Imdur, lisinopril, and Toprol-XL.  2.  Brief episodes of PSVT documented by previous cardiac monitor.  No active palpitations.  She remains on Toprol-XL.  3.  Mild calcific aortic stenosis.  Medication Adjustments/Labs and Tests Ordered: Current medicines are reviewed at length with the patient today.  Concerns regarding medicines are outlined above.   Tests Ordered: No orders of the defined types were placed in this encounter.   Medication Changes: No orders of the defined types were placed in this encounter.   Disposition:  Follow up  6 months.  Signed, Satira Sark, MD, [facility] 03/16/2022 2:34 PM    [place] [facility] [address] [place], [place] [address] Phone: [phone]; Fax: [phone]

## 2022-03-16 NOTE — Patient Instructions (Signed)
Medication Instructions:  Your physician recommends that you continue on your current medications as directed. Please refer to the Current Medication list given to you today.   Labwork: None today  Testing/Procedures: None today  Follow-Up: 6 months  Any Other Special Instructions Will Be Listed Below (If Applicable).  If you need a refill on your cardiac medications before your next appointment, please call your pharmacy.  

## 2022-03-17 ENCOUNTER — Encounter: Payer: Self-pay | Admitting: Internal Medicine

## 2022-03-17 ENCOUNTER — Ambulatory Visit (INDEPENDENT_AMBULATORY_CARE_PROVIDER_SITE_OTHER): Payer: BC Managed Care – PPO | Admitting: Internal Medicine

## 2022-03-17 VITALS — BP 102/66 | HR 77 | Ht 64.0 in | Wt 213.2 lb

## 2022-03-17 DIAGNOSIS — Z794 Long term (current) use of insulin: Secondary | ICD-10-CM

## 2022-03-17 DIAGNOSIS — Z23 Encounter for immunization: Secondary | ICD-10-CM | POA: Insufficient documentation

## 2022-03-17 DIAGNOSIS — F419 Anxiety disorder, unspecified: Secondary | ICD-10-CM

## 2022-03-17 DIAGNOSIS — E1159 Type 2 diabetes mellitus with other circulatory complications: Secondary | ICD-10-CM

## 2022-03-17 DIAGNOSIS — E1165 Type 2 diabetes mellitus with hyperglycemia: Secondary | ICD-10-CM | POA: Diagnosis not present

## 2022-03-17 LAB — POCT GLYCOSYLATED HEMOGLOBIN (HGB A1C): HbA1c, POC (controlled diabetic range): 7.2 % — AB (ref 0.0–7.0)

## 2022-03-17 MED ORDER — ESCITALOPRAM OXALATE 20 MG PO TABS: 20.0000 mg | ORAL_TABLET | Freq: Every day | ORAL | 1 refills | Status: DC

## 2022-03-17 MED ORDER — TIRZEPATIDE 7.5 MG/0.5ML ~~LOC~~ SOAJ: 7.5000 mg | SUBCUTANEOUS | 0 refills | Status: AC

## 2022-03-17 NOTE — Assessment & Plan Note (Signed)
Presenting today for diabetes follow-up.  Repeat A1c today is 7.2, which is down from 8.4 in October.  She is pleased with her progress.  Her current regimen consists of Tresiba 20 units every morning, metformin 1000 mg twice daily, Farxiga 10 mg daily, and Mounjaro 5 mg weekly.  She has completed 4 injections of Mounjaro 5 mg weekly.  She has lost 19 pounds since she was last seen by me on 1/10. -Ms. [name] was congratulated on her progress today.  I anticipate that she will continue to lose weight and further improve her A1c.  -She is in agreement with increasing Mounjaro to 7.5 mg weekly once she completes her remaining 5 mg injections.  I anticipate that she will be able to titrate off of insulin. -Continue metformin and Farxiga at current doses -We will tentatively plan for follow-up in 3 months and repeat A1c at that time.

## 2022-03-17 NOTE — Assessment & Plan Note (Signed)
Tdap vaccine administered today

## 2022-03-17 NOTE — Patient Instructions (Signed)
It was a pleasure to see you today.  Thank you for giving [place] the opportunity to be involved in your care.  Below is a brief recap of your visit and next steps.  We will plan to see you again in 3 months.  Summary Congratulations on your progress. Keep up the great work. Increase Mounjaro to 7.5 mg weekly after you complete your remaining 5 mg injections I have refilled Lexapro with a 90-day supply. We will follow up in 3 months You will receive your tetanus shot today

## 2022-03-17 NOTE — Progress Notes (Signed)
Established Patient Office Visit  Subjective   Patient ID: [name], female    DOB: 08-Sep-1955  Age: 67 y.o. MRN: [id number]  Chief Complaint  Patient presents with   Diabetes    Follow up   [name] returns to care today for DM follow-up.  She was last seen by me on 1/10 at which time I recommended that she gradually reduce [place] based on her a.m. blood sugar readings as long as they stay within goal (90-130).  Mounjaro was increased to 5 mg weekly after she completed four 2.5 mg injections.  In the interim she has been seen by cardiology for follow-up.  There have otherwise been no acute interval events.  [name] reports feeling well today.  She is asymptomatic and has no additional concerns to discuss.  She is anxious to repeat her A1c to assess her progress.  [name] is no longer taking Tresiba at night.  She is down to 20 units every morning.  She remains off of NovoLog.  [name] has not experienced any adverse side effects with increasing Mounjaro to 5 mg.  She has lost 19 pounds and since she was last seen by me on 1/10.  Past Medical History:  Diagnosis Date   Anxiety    Aortic stenosis    CAD (coronary artery disease)    a. 10/2014: Ant STEMI s/p DES to dLAD   Carotid disease, bilateral ([place])    a. Duplex 12/2014: mild-mod atherosclerotic plaque without hemodynamically significant stenosis.   COPD (chronic obstructive pulmonary disease) (HCC)    Essential hypertension    HLD (hyperlipidemia)    Morbid obesity (HCC)    Myocardial infarction ([place]) 11/17/2014   PONV (postoperative nausea and vomiting)    Shingles    Sleep apnea    Uses CPAP   Type 2 diabetes mellitus [facility])    Past Surgical History:  Procedure Laterality Date   BREAST BIOPSY Left    2016   CARDIAC CATHETERIZATION N/A 11/17/2014   Procedure: Left Heart Cath and Coronary Angiography;  Surgeon: Wellington Hampshire, MD;  Location: [place] [facility];  Service: Cardiovascular;  Laterality: N/A;    CARDIAC CATHETERIZATION N/A 11/17/2014   Procedure: Coronary Stent Intervention;  Surgeon: Wellington Hampshire, MD;  Location: [place] [facility];  Service: Cardiovascular;  Laterality: N/A;  distal lad 2.25x20 promus   CATARACT EXTRACTION W/PHACO Left 09/20/2016   Procedure: CATARACT EXTRACTION PHACO AND INTRAOCULAR LENS PLACEMENT (IOC);  Surgeon: Tonny Branch, MD;  Location: [facility];  Service: Ophthalmology;  Laterality: Left;  CDE: 9.27   CATARACT EXTRACTION W/PHACO Right 10/18/2016   Procedure: CATARACT EXTRACTION PHACO AND INTRAOCULAR LENS PLACEMENT (IOC);  Surgeon: Tonny Branch, MD;  Location: [facility];  Service: Ophthalmology;  Laterality: Right;  CDE: 6.38   CHOLECYSTECTOMY     COLONOSCOPY WITH PROPOFOL N/A 02/16/2021   Procedure: COLONOSCOPY WITH PROPOFOL;  Surgeon: Eloise Harman, DO;  [facility];  Service: Endoscopy;  Laterality: N/A;  1:30 / ASA 3   POLYPECTOMY  02/16/2021   Procedure: POLYPECTOMY;  Surgeon: Eloise Harman, DO;  [facility]: [facility];  Service: Endoscopy;;   SPINAL FUSION     cervical; screws and plates.   TOTAL ABDOMINAL HYSTERECTOMY  1980   Social History   Tobacco Use   Smoking status: Every Day    Packs/day: 0.50    Years: 30.00    Total pack years: 15.00    Types: Cigarettes  Start date: 12/26/1972   Smokeless tobacco: Never   Tobacco comments:    3 cigs per day  Vaping Use   Vaping Use: Never used  Substance Use Topics   Alcohol use: No    Alcohol/week: 0.0 standard drinks of alcohol   Drug use: No   Family History  Problem Relation Age of Onset   Heart attack Father    Heart disease Father    Cancer Brother    Allergies  Allergen Reactions   Codeine Nausea And Vomiting   Sulfa Antibiotics Rash   Review of Systems  Constitutional:  Negative for chills and fever.  HENT:  Negative for sore throat.   Respiratory:  Negative for cough and shortness of breath.   Cardiovascular:  Negative for chest pain, palpitations and  leg swelling.  Gastrointestinal:  Negative for abdominal pain, blood in stool, constipation, diarrhea, nausea and vomiting.  Genitourinary:  Negative for dysuria and hematuria.  Musculoskeletal:  Negative for myalgias.  Skin:  Negative for itching and rash.  Neurological:  Negative for dizziness and headaches.  Psychiatric/Behavioral:  Negative for depression and suicidal ideas.      Objective:     BP 102/66   Pulse 77   Ht 5' 4"$  (1.626 m)   Wt 213 lb 3.2 oz (96.7 kg)   SpO2 90%   BMI 36.60 kg/m  BP Readings from Last 3 Encounters:  03/17/22 102/66  03/16/22 130/60  02/03/22 106/66   Physical Exam Vitals reviewed.  Constitutional:      General: She is not in acute distress.    Appearance: Normal appearance. She is obese. She is not toxic-appearing.  HENT:     Head: Normocephalic and atraumatic.     Right Ear: External ear normal.     Left Ear: External ear normal.     Nose: Nose normal. No congestion or rhinorrhea.     Mouth/Throat:     Mouth: Mucous membranes are moist.     Pharynx: Oropharynx is clear. No oropharyngeal exudate or posterior oropharyngeal erythema.  Eyes:     General: No scleral icterus.    Extraocular Movements: Extraocular movements intact.     Conjunctiva/sclera: Conjunctivae normal.     Pupils: Pupils are equal, round, and reactive to light.  Cardiovascular:     Rate and Rhythm: Normal rate and regular rhythm.     Pulses: Normal pulses.     Heart sounds: Normal heart sounds. No murmur heard.    No friction rub. No gallop.  Pulmonary:     Effort: Pulmonary effort is normal.     Breath sounds: Normal breath sounds. No wheezing, rhonchi or rales.  Abdominal:     General: Abdomen is flat. Bowel sounds are normal. There is no distension.     Palpations: Abdomen is soft.     Tenderness: There is no abdominal tenderness.  Musculoskeletal:        General: No swelling. Normal range of motion.     Cervical back: Normal range of motion.     Right  lower leg: No edema.     Left lower leg: No edema.  Lymphadenopathy:     Cervical: No cervical adenopathy.  Skin:    General: Skin is warm and dry.     Capillary Refill: Capillary refill takes less than 2 seconds.     Coloration: Skin is not jaundiced.  Neurological:     General: No focal deficit present.     Mental Status: She is alert and  oriented to person, place, and time.  Psychiatric:        Mood and Affect: Mood normal.        Behavior: Behavior normal.   Last CBC Lab Results  Component Value Date   WBC 6.3 11/22/2021   HGB 14.8 11/22/2021   HCT 46.4 (H) 11/22/2021   MCV 93.9 11/22/2021   MCH 30.0 11/22/2021   RDW 15.3 11/22/2021   PLT 232 [id number]   Last metabolic panel Lab Results  Component Value Date   GLUCOSE 113 (H) 11/22/2021   NA 141 11/22/2021   K 4.0 11/22/2021   CL 107 11/22/2021   CO2 25 11/22/2021   BUN 20 11/22/2021   CREATININE 0.83 11/22/2021   GFRNONAA >60 11/22/2021   CALCIUM 8.9 11/22/2021   PHOS 4.9 (H) 11/22/2021   PROT 6.6 11/22/2021   ALBUMIN 3.5 11/22/2021   LABGLOB 2.8 11/03/2021   AGRATIO 1.5 11/03/2021   BILITOT 0.6 11/22/2021   ALKPHOS 75 11/22/2021   AST 17 11/22/2021   ALT 21 11/22/2021   ANIONGAP 9 11/22/2021   Last lipids Lab Results  Component Value Date   CHOL 118 11/03/2021   HDL 40 11/03/2021   LDLCALC 60 11/03/2021   TRIG 97 11/03/2021   CHOLHDL 3.0 11/03/2021   Last hemoglobin A1c Lab Results  Component Value Date   HGBA1C 7.2 (A) 03/17/2022   Last thyroid functions Lab Results  Component Value Date   TSH 2.790 11/03/2021   Last vitamin D Lab Results  Component Value Date   VD25OH 61.8 11/03/2021     Assessment & Plan:   Problem List Items Addressed This Visit       Type 2 diabetes mellitus with hyperglycemia ([place])    Presenting today for diabetes follow-up.  Repeat A1c today is 7.2, which is down from 8.4 in October.  She is pleased with her progress.  Her current regimen consists of  Tresiba 20 units every morning, metformin 1000 mg twice daily, Farxiga 10 mg daily, and Mounjaro 5 mg weekly.  She has completed 4 injections of Mounjaro 5 mg weekly.  She has lost 19 pounds since she was last seen by me on 1/10. -Ms. Victorian was congratulated on her progress today.  I anticipate that she will continue to lose weight and further improve her A1c.  -She is in agreement with increasing Mounjaro to 7.5 mg weekly once she completes her remaining 5 mg injections.  I anticipate that she will be able to titrate off of insulin. -Continue metformin and Farxiga at current doses -We will tentatively plan for follow-up in 3 months and repeat A1c at that time.      Need for Tdap vaccination    Tdap vaccine administered today      Return in about 3 months (around 06/15/2022).    Johnette Abraham, MD

## 2022-03-22 ENCOUNTER — Other Ambulatory Visit: Payer: Self-pay | Admitting: Cardiology

## 2022-03-24 ENCOUNTER — Other Ambulatory Visit: Payer: Self-pay

## 2022-03-24 MED ORDER — FLUTICASONE FUROATE-VILANTEROL 100-25 MCG/ACT IN AEPB: 1.0000 | INHALATION_SPRAY | Freq: Every day | RESPIRATORY_TRACT | 3 refills | Status: DC

## 2022-03-27 ENCOUNTER — Other Ambulatory Visit: Payer: Self-pay | Admitting: Internal Medicine

## 2022-03-27 DIAGNOSIS — E1165 Type 2 diabetes mellitus with hyperglycemia: Secondary | ICD-10-CM

## 2022-04-05 ENCOUNTER — Ambulatory Visit: Payer: BC Managed Care – PPO | Admitting: Cardiology

## 2022-04-12 DIAGNOSIS — G4733 Obstructive sleep apnea (adult) (pediatric): Secondary | ICD-10-CM | POA: Diagnosis not present

## 2022-04-13 DIAGNOSIS — G4733 Obstructive sleep apnea (adult) (pediatric): Secondary | ICD-10-CM | POA: Diagnosis not present

## 2022-04-19 ENCOUNTER — Encounter: Payer: Self-pay | Admitting: Internal Medicine

## 2022-04-20 MED ORDER — FLUCONAZOLE 150 MG PO TABS: 150.0000 mg | ORAL_TABLET | Freq: Once | ORAL | 0 refills | Status: DC

## 2022-04-21 ENCOUNTER — Encounter: Payer: Self-pay | Admitting: Cardiology

## 2022-04-21 ENCOUNTER — Other Ambulatory Visit: Payer: Self-pay

## 2022-04-21 MED ORDER — ATORVASTATIN CALCIUM 40 MG PO TABS: 40.0000 mg | ORAL_TABLET | ORAL | 1 refills | Status: DC

## 2022-05-13 DIAGNOSIS — G4733 Obstructive sleep apnea (adult) (pediatric): Secondary | ICD-10-CM | POA: Diagnosis not present

## 2022-06-05 ENCOUNTER — Other Ambulatory Visit: Payer: Self-pay | Admitting: Internal Medicine

## 2022-06-12 DIAGNOSIS — G4733 Obstructive sleep apnea (adult) (pediatric): Secondary | ICD-10-CM | POA: Diagnosis not present

## 2022-06-14 ENCOUNTER — Other Ambulatory Visit: Payer: Self-pay | Admitting: Cardiology

## 2022-06-16 ENCOUNTER — Other Ambulatory Visit: Payer: Self-pay | Admitting: Cardiology

## 2022-06-16 ENCOUNTER — Ambulatory Visit: Payer: BC Managed Care – PPO | Admitting: Internal Medicine

## 2022-06-18 ENCOUNTER — Other Ambulatory Visit: Payer: Self-pay

## 2022-06-18 ENCOUNTER — Encounter: Payer: Self-pay | Admitting: Cardiology

## 2022-06-18 MED ORDER — LISINOPRIL 2.5 MG PO TABS: 2.5000 mg | ORAL_TABLET | Freq: Every day | ORAL | 3 refills | Status: DC

## 2022-06-20 ENCOUNTER — Other Ambulatory Visit: Payer: Self-pay | Admitting: Internal Medicine

## 2022-06-20 DIAGNOSIS — E1159 Type 2 diabetes mellitus with other circulatory complications: Secondary | ICD-10-CM

## 2022-06-25 ENCOUNTER — Ambulatory Visit: Payer: BC Managed Care – PPO | Admitting: Internal Medicine

## 2022-07-09 ENCOUNTER — Ambulatory Visit: Payer: BC Managed Care – PPO | Admitting: Internal Medicine

## 2022-07-12 ENCOUNTER — Ambulatory Visit (INDEPENDENT_AMBULATORY_CARE_PROVIDER_SITE_OTHER): Payer: BC Managed Care – PPO | Admitting: Internal Medicine

## 2022-07-12 ENCOUNTER — Encounter: Payer: Self-pay | Admitting: Internal Medicine

## 2022-07-12 VITALS — BP 129/73 | HR 81 | Ht 64.0 in | Wt 197.2 lb

## 2022-07-12 DIAGNOSIS — Z1231 Encounter for screening mammogram for malignant neoplasm of breast: Secondary | ICD-10-CM

## 2022-07-12 DIAGNOSIS — E1159 Type 2 diabetes mellitus with other circulatory complications: Secondary | ICD-10-CM

## 2022-07-12 DIAGNOSIS — J449 Chronic obstructive pulmonary disease, unspecified: Secondary | ICD-10-CM | POA: Diagnosis not present

## 2022-07-12 DIAGNOSIS — I10 Essential (primary) hypertension: Secondary | ICD-10-CM | POA: Diagnosis not present

## 2022-07-12 LAB — POCT GLYCOSYLATED HEMOGLOBIN (HGB A1C): Hemoglobin A1C: 6.8 % — AB (ref 4.0–5.6)

## 2022-07-12 MED ORDER — TIRZEPATIDE 7.5 MG/0.5ML ~~LOC~~ SOAJ
7.5000 mg | SUBCUTANEOUS | 1 refills | Status: DC
Start: 2022-07-12 — End: 2022-10-18

## 2022-07-12 NOTE — Assessment & Plan Note (Signed)
Screening mammogram ordered today ?

## 2022-07-12 NOTE — Assessment & Plan Note (Signed)
Asymptomatic currently.  Symptoms are well-controlled with Breo.

## 2022-07-12 NOTE — Progress Notes (Signed)
Established Patient Office Visit  Subjective   Patient ID: [name], female    DOB: [birth year]  Age: 67 y.o. MRN: [id number]  Chief Complaint  Patient presents with   Diabetes    Follow up   [name] returns to care today for routine follow-up.  She was last evaluated by me on 2/21.  Mounjaro was increased to 7.5 mg weekly at that time.  There have been no acute interval events.  [name] reports feeling well today.  She is asymptomatic and has no acute concerns to discuss.  She has been checking her blood sugar routinely and has titrated herself completely off of insulin.  Past Medical History:  Diagnosis Date   Anxiety    Aortic stenosis    CAD (coronary artery disease)    a. 10/2014: Ant STEMI s/p DES to dLAD   Carotid disease, bilateral (HCC)    a. Duplex 12/2014: mild-mod atherosclerotic plaque without hemodynamically significant stenosis.   COPD (chronic obstructive pulmonary disease) (HCC)    Essential hypertension    HLD (hyperlipidemia)    Morbid obesity (HCC)    Myocardial infarction (HCC) 11/17/2014   PONV (postoperative nausea and vomiting)    Shingles    Sleep apnea    Uses CPAP   Type 2 diabetes mellitus [facility])    Past Surgical History:  Procedure Laterality Date   BREAST BIOPSY Left    2016   CARDIAC CATHETERIZATION N/A 11/17/2014   Procedure: Left Heart Cath and Coronary Angiography;  Surgeon: Iran Ouch, MD;  [facility];  Service: Cardiovascular;  Laterality: N/A;   CARDIAC CATHETERIZATION N/A 11/17/2014   Procedure: Coronary Stent Intervention;  Surgeon: Iran Ouch, MD;  Location: [facility];  Service: Cardiovascular;  Laterality: N/A;  distal lad 2.25x20 promus   CATARACT EXTRACTION W/PHACO Left 09/20/2016   Procedure: CATARACT EXTRACTION PHACO AND INTRAOCULAR LENS PLACEMENT (IOC);  Surgeon: Gemma Payor, MD;  Location: [facility];  Service: Ophthalmology;  Laterality: Left;  CDE: 9.27   CATARACT EXTRACTION  W/PHACO Right 10/18/2016   Procedure: CATARACT EXTRACTION PHACO AND INTRAOCULAR LENS PLACEMENT (IOC);  Surgeon: Gemma Payor, MD;  Location: [facility];  Service: Ophthalmology;  Laterality: Right;  CDE: 6.38   CHOLECYSTECTOMY     COLONOSCOPY WITH PROPOFOL N/A 02/16/2021   Procedure: COLONOSCOPY WITH PROPOFOL;  Surgeon: Lanelle Bal, DO;  [facility];  Service: Endoscopy;  Laterality: N/A;  1:30 / ASA 3   POLYPECTOMY  02/16/2021   Procedure: POLYPECTOMY;  Surgeon: Lanelle Bal, DO;  [facility];  Service: Endoscopy;;   SPINAL FUSION     cervical; screws and plates.   TOTAL ABDOMINAL HYSTERECTOMY  1980   Social History   Tobacco Use   Smoking status: Every Day    Packs/day: 0.50    Years: 30.00    Additional pack years: 0.00    Total pack years: 15.00    Types: Cigarettes    Start date: 12/26/1972   Smokeless tobacco: Never   Tobacco comments:    3 cigs per day  Vaping Use   Vaping Use: Never used  Substance Use Topics   Alcohol use: No    Alcohol/week: 0.0 standard drinks of alcohol   Drug use: No   Family History  Problem Relation Age of Onset   Heart attack Father    Heart disease Father    Cancer Brother    Allergies  Allergen Reactions  Codeine Nausea And Vomiting   Sulfa Antibiotics Rash   Review of Systems  Musculoskeletal:  Positive for joint pain (Left index finger PIP pain).  All other systems reviewed and are negative.    Objective:     BP 129/73   Pulse 81   Ht 5\' 4"  (1.626 m)   Wt 197 lb 3.2 oz (89.4 kg)   SpO2 92%   BMI 33.85 kg/m  BP Readings from Last 3 Encounters:  07/12/22 129/73  03/17/22 102/66  03/16/22 130/60   Physical Exam Vitals reviewed.  Constitutional:      General: She is not in acute distress.    Appearance: Normal appearance. She is obese. She is not toxic-appearing.  HENT:     Head: Normocephalic and atraumatic.     Right Ear: External ear normal.     Left Ear: External ear normal.      Nose: Nose normal. No congestion or rhinorrhea.     Mouth/Throat:     Mouth: Mucous membranes are moist.     Pharynx: Oropharynx is clear. No oropharyngeal exudate or posterior oropharyngeal erythema.  Eyes:     General: No scleral icterus.    Extraocular Movements: Extraocular movements intact.     Conjunctiva/sclera: Conjunctivae normal.     Pupils: Pupils are equal, round, and reactive to light.  Cardiovascular:     Rate and Rhythm: Normal rate and regular rhythm.     Pulses: Normal pulses.     Heart sounds: Normal heart sounds. No murmur heard.    No friction rub. No gallop.  Pulmonary:     Effort: Pulmonary effort is normal.     Breath sounds: Normal breath sounds. No wheezing, rhonchi or rales.  Abdominal:     General: Abdomen is flat. Bowel sounds are normal. There is no distension.     Palpations: Abdomen is soft.     Tenderness: There is no abdominal tenderness.  Musculoskeletal:        General: No swelling. Normal range of motion.     Cervical back: Normal range of motion.     Right lower leg: No edema.     Left lower leg: No edema.  Lymphadenopathy:     Cervical: No cervical adenopathy.  Skin:    General: Skin is warm and dry.     Capillary Refill: Capillary refill takes less than 2 seconds.     Coloration: Skin is not jaundiced.  Neurological:     General: No focal deficit present.     Mental Status: She is alert and oriented to person, place, and time.  Psychiatric:        Mood and Affect: Mood normal.        Behavior: Behavior normal.   Last CBC Lab Results  Component Value Date   WBC 6.3 11/22/2021   HGB 14.8 11/22/2021   HCT 46.4 (H) 11/22/2021   MCV 93.9 11/22/2021   MCH 30.0 11/22/2021   RDW 15.3 11/22/2021   PLT 232 11/22/2021   Last metabolic panel Lab Results  Component Value Date   GLUCOSE 113 (H) 11/22/2021   NA 141 11/22/2021   K 4.0 11/22/2021   CL 107 11/22/2021   CO2 25 11/22/2021   BUN 20 11/22/2021   CREATININE 0.83 11/22/2021    GFRNONAA >60 11/22/2021   CALCIUM 8.9 11/22/2021   PHOS 4.9 (H) 11/22/2021   PROT 6.6 11/22/2021   ALBUMIN 3.5 11/22/2021   LABGLOB 2.8 11/03/2021   AGRATIO 1.5 11/03/2021  BILITOT 0.6 11/22/2021   ALKPHOS 75 11/22/2021   AST 17 11/22/2021   ALT 21 11/22/2021   ANIONGAP 9 11/22/2021   Last lipids Lab Results  Component Value Date   CHOL 118 11/03/2021   HDL 40 11/03/2021   LDLCALC 60 11/03/2021   TRIG 97 11/03/2021   CHOLHDL 3.0 11/03/2021   Last hemoglobin A1c Lab Results  Component Value Date   HGBA1C 6.8 (A) 07/12/2022   Last thyroid functions Lab Results  Component Value Date   TSH 2.790 11/03/2021   Last vitamin D Lab Results  Component Value Date   VD25OH 61.8 11/03/2021     Assessment & Plan:   Problem List Items Addressed This Visit       Essential hypertension    Remains well-controlled on current antihypertensive regimen.  No medication changes are indicated today.      DM type 2 causing vascular disease (HCC)    POC A1C has improved to 6.8 from 7.2 previously.  She is currently prescribed metformin 1000 mg twice daily, Farxiga 10 mg daily, and Mounjaro 7.5 mg weekly.  She has titrated herself completely off of Tresiba since her last appointment.   -No medication changes today.  She was congratulated on her progress.      COPD (chronic obstructive pulmonary disease) (HCC)    Asymptomatic currently.  Symptoms are well-controlled with Breo.      Breast cancer screening by mammogram - Primary    Screening mammogram ordered today       Return in about 3 months (around 10/12/2022).    Billie Lade, MD

## 2022-07-12 NOTE — Assessment & Plan Note (Addendum)
POC A1C has improved to 6.8 from 7.2 previously.  She is currently prescribed metformin 1000 mg twice daily, Farxiga 10 mg daily, and Mounjaro 7.5 mg weekly.  She has titrated herself completely off of Tresiba since her last appointment.   -No medication changes today.  She was congratulated on her progress.

## 2022-07-12 NOTE — Assessment & Plan Note (Signed)
Remains well-controlled on current antihypertensive regimen.  No medication changes are indicated today. 

## 2022-07-12 NOTE — Patient Instructions (Signed)
It was a pleasure to see you today.  Thank you for giving [place] the opportunity to be involved in your care.  Below is a brief recap of your visit and next steps.  We will plan to see you again in 3 months.  Summary Congratulations on your progress. We will plan for follow up in 3 months. Mammogram ordered today

## 2022-07-21 ENCOUNTER — Other Ambulatory Visit: Payer: Self-pay | Admitting: Internal Medicine

## 2022-07-25 ENCOUNTER — Ambulatory Visit
Admission: EM | Admit: 2022-07-25 | Discharge: 2022-07-25 | Disposition: A | Discharge status: Home / Self Care | Payer: BC Managed Care – PPO | Attending: Family Medicine | Admitting: Family Medicine

## 2022-07-25 DIAGNOSIS — S8011XA Contusion of right lower leg, initial encounter: Secondary | ICD-10-CM | POA: Diagnosis not present

## 2022-07-25 MED ORDER — IBUPROFEN 800 MG PO TABS: 800.0000 mg | ORAL_TABLET | Freq: Three times a day (TID) | ORAL | 0 refills | Status: DC | PRN

## 2022-07-25 MED ORDER — TIZANIDINE HCL 4 MG PO CAPS: 4.0000 mg | ORAL_CAPSULE | Freq: Three times a day (TID) | ORAL | 0 refills | Status: DC | PRN

## 2022-07-25 NOTE — Discharge Instructions (Signed)
Apply ice off-and-on to the area, elevate at rest to help with swelling, ibuprofen and Tylenol as needed.  Zanaflex for muscle aches from the fall.

## 2022-07-25 NOTE — ED Triage Notes (Signed)
Pt reports she fell this morning and her right shin hit the silver threshold of the door and now she has a raised area  on her right leg. Pt reports right leg is painful from below her knee cap  to her toes.

## 2022-07-25 NOTE — ED Provider Notes (Signed)
[facility]    CSN: [id number] Arrival date & time: 07/25/22  1047      History   Chief Complaint Chief Complaint  Patient presents with   Fall    HPI Morgan FILHO is a 67 y.o. female.   Patient presenting today with pain and swelling and bruising to the right anterior lower leg after a fall earlier today where she hit this part of her leg on the metal threshold of the door.  She states that her leg is moving without difficulty but pain is shooting downward toward her ankle from the swollen area below her knee.  Denies bleeding, numbness, tingling, injuries elsewhere from the fall.  So far not trying anything over-the-counter for symptoms.    Past Medical History:  Diagnosis Date   Anxiety    Aortic stenosis    CAD (coronary artery disease)    a. 10/2014: Ant STEMI s/p DES to dLAD   Carotid disease, bilateral (HCC)    a. Duplex 12/2014: mild-mod atherosclerotic plaque without hemodynamically significant stenosis.   COPD (chronic obstructive pulmonary disease) (HCC)    Essential hypertension    HLD (hyperlipidemia)    Morbid obesity (HCC)    Myocardial infarction (HCC) 11/17/2014   PONV (postoperative nausea and vomiting)    Shingles    Sleep apnea    Uses CPAP   Type 2 diabetes mellitus (HCC)     Patient Active Problem List   Diagnosis Date Noted   Breast cancer screening by mammogram 07/12/2022   Need for Tdap vaccination 03/17/2022   Shortness of breath 01/06/2022   Encounter for general adult medical examination with abnormal findings 12/07/2021   Carotid artery stenosis 11/22/2021   Syncope 11/21/2021   Type 2 diabetes mellitus with hyperglycemia (HCC) 11/21/2021   Prolonged QT interval 11/21/2021   Sleep apnea 11/21/2021   H/O total hysterectomy 11/05/2021   Encounter to establish care 11/02/2021   Acute diastolic CHF (congestive heart failure) (HCC) 08/27/2018   Acute respiratory failure with hypoxia (HCC) 08/27/2018   Pleural effusion due  to CHF (congestive heart failure) (HCC) 08/27/2018   Hypokalemia 08/27/2018   Abdominal wall cellulitis 08/25/2018   Anasarca 08/25/2018   Hypoxia 08/25/2018   Backache 08/25/2018   Anxiety 08/25/2018   Morbid obesity (HCC) 01/14/2015   CAD (coronary artery disease)    COPD (chronic obstructive pulmonary disease) (HCC)    Essential hypertension    DM type 2 causing vascular disease (HCC)    Current smoker    Mixed hyperlipidemia    Aortic stenosis    ST elevation (STEMI) myocardial infarction involving left anterior descending coronary artery (HCC) 11/17/2014    Past Surgical History:  Procedure Laterality Date   BREAST BIOPSY Left    2016   CARDIAC CATHETERIZATION N/A 11/17/2014   Procedure: Left Heart Cath and Coronary Angiography;  Surgeon: Iran Ouch, MD;  Location: [facility];  Service: Cardiovascular;  Laterality: N/A;   CARDIAC CATHETERIZATION N/A 11/17/2014   Procedure: Coronary Stent Intervention;  Surgeon: Iran Ouch, MD;  Location: [facility];  Service: Cardiovascular;  Laterality: N/A;  distal lad 2.25x20 promus   CATARACT EXTRACTION W/PHACO Left 09/20/2016   Procedure: CATARACT EXTRACTION PHACO AND INTRAOCULAR LENS PLACEMENT (IOC);  Surgeon: Gemma Payor, MD;  Location: [facility];  Service: Ophthalmology;  Laterality: Left;  CDE: 9.27   CATARACT EXTRACTION W/PHACO Right 10/18/2016   Procedure: CATARACT EXTRACTION PHACO AND INTRAOCULAR LENS PLACEMENT (IOC);  Surgeon: Gemma Payor,  MD;  Location: [facility];  Service: Ophthalmology;  Laterality: Right;  CDE: 6.38   CHOLECYSTECTOMY     COLONOSCOPY WITH PROPOFOL N/A 02/16/2021   Procedure: COLONOSCOPY WITH PROPOFOL;  Surgeon: Lanelle Bal, DO;  Location: [facility];  Service: Endoscopy;  Laterality: N/A;  1:30 / ASA 3   POLYPECTOMY  02/16/2021   Procedure: POLYPECTOMY;  Surgeon: Lanelle Bal, DO;  Location: [facility];  Service: Endoscopy;;   SPINAL FUSION     cervical; screws and  plates.   TOTAL ABDOMINAL HYSTERECTOMY  1980    OB History     Gravida  1   Para  1   Term  1   Preterm      AB      Living  1      SAB      IAB      Ectopic      Multiple      Live Births               Home Medications    Prior to Admission medications   Medication Sig Start Date End Date Taking? Authorizing Provider  ibuprofen (ADVIL) 800 MG tablet Take 1 tablet (800 mg total) by mouth every 8 (eight) hours as needed. 07/25/22  Yes Particia Nearing, PA-C  tiZANidine (ZANAFLEX) 4 MG capsule Take 1 capsule (4 mg total) by mouth 3 (three) times daily as needed for muscle spasms. Do not drink alcohol or drive while taking this medication.  May cause drowsiness. 07/25/22  Yes Particia Nearing, PA-C  albuterol (VENTOLIN HFA) 108 (90 Base) MCG/ACT inhaler Inhale 2 puffs into the lungs every 6 (six) hours as needed for wheezing or shortness of breath. 03/15/22   Billie Lade, MD  aspirin EC 81 MG tablet Take 81 mg by mouth daily.    [provider]  atorvastatin (LIPITOR) 40 MG tablet TAKE 1 TABLET (40 MG TOTAL) BY MOUTH EVERY OTHER DAY. TAKE AT [facility] 06/15/22   Jonelle Sidle, MD  dapagliflozin propanediol (FARXIGA) 10 MG TABS tablet Take 10 mg by mouth daily.    [provider]  escitalopram (LEXAPRO) 20 MG tablet Take 1 tablet (20 mg total) by mouth daily at 6 (six) AM. 03/17/22 09/13/22  Billie Lade, MD  fluticasone (FLONASE) 50 MCG/ACT nasal spray Place 1 spray into both nostrils daily. 10/24/20   Lamptey, Britta Mccreedy, MD  fluticasone furoate-vilanterol (BREO ELLIPTA) 100-25 MCG/ACT AEPB Inhale 1 puff into the lungs daily. 03/24/22   Billie Lade, MD  furosemide (LASIX) 20 MG tablet Take 1 tablet (20 mg total) by mouth daily. 01/06/22   Billie Lade, MD  ibuprofen (ADVIL) 600 MG tablet Take 1 tablet (600 mg total) by mouth every 6 (six) hours as needed. Patient taking differently: Take 600 mg by mouth every 6 (six) hours as  needed for headache. 11/13/20   Domenick Gong, MD  isosorbide mononitrate (IMDUR) 30 MG 24 hr tablet TAKE 1 TABLET BY MOUTH EVERY DAY Patient taking differently: Take 30 mg by mouth daily. 07/14/21   Jonelle Sidle, MD  ketoconazole (NIZORAL) 2 % cream Apply 1 application topically daily as needed for rash. 01/23/21   [provider]  lisinopril (ZESTRIL) 2.5 MG tablet Take 1 tablet (2.5 mg total) by mouth daily. 06/18/22   Jonelle Sidle, MD  metFORMIN (GLUCOPHAGE) 1000 MG tablet TAKE 1 TABLET BY MOUTH TWICE A DAY 06/07/22   Billie Lade,  MD  metoprolol succinate (TOPROL XL) 25 MG 24 hr tablet Take 0.5 tablets (12.5 mg total) by mouth daily. 11/22/21   Johnson, Clanford L, MD  nystatin (MYCOSTATIN/NYSTOP) powder Apply 1 application topically daily as needed for rash. 01/23/21   [provider]  tirzepatide Greggory Keen) 7.5 MG/0.5ML Pen Inject 7.5 mg into the skin once a week. 07/12/22   Billie Lade, MD    Family History Family History  Problem Relation Age of Onset   Heart attack Father    Heart disease Father    Cancer Brother     Social History Social History   Tobacco Use   Smoking status: Every Day    Packs/day: 0.50    Years: 30.00    Additional pack years: 0.00    Total pack years: 15.00    Types: Cigarettes    Start date: 12/26/1972   Smokeless tobacco: Never   Tobacco comments:    3 cigs per day  Vaping Use   Vaping Use: Never used  Substance Use Topics   Alcohol use: No    Alcohol/week: 0.0 standard drinks of alcohol   Drug use: No     Allergies   Codeine and Sulfa antibiotics   Review of Systems Review of Systems Per HPI  Physical Exam Triage Vital Signs ED Triage Vitals  Enc Vitals Group     BP 07/25/22 1215 134/75     Pulse Rate 07/25/22 1215 65     Resp 07/25/22 1215 18     Temp 07/25/22 1215 98.2 F (36.8 C)     Temp Source 07/25/22 1215 Oral     SpO2 --      Weight --      Height --      Head Circumference  --      Peak Flow --      Pain Score 07/25/22 1217 10     Pain Loc --      Pain Edu? --      Excl. in GC? --    No data found.  Updated Vital Signs BP 134/75 (BP Location: Right Arm)   Pulse 65   Temp 98.2 F (36.8 C) (Oral)   Resp 18   Visual Acuity Right Eye Distance:   Left Eye Distance:   Bilateral Distance:    Right Eye Near:   Left Eye Near:    Bilateral Near:     Physical Exam Vitals and nursing note reviewed.  Constitutional:      Appearance: Normal appearance. She is not ill-appearing.  HENT:     Head: Atraumatic.  Eyes:     Extraocular Movements: Extraocular movements intact.     Conjunctiva/sclera: Conjunctivae normal.  Cardiovascular:     Rate and Rhythm: Normal rate and regular rhythm.     Heart sounds: Normal heart sounds.  Pulmonary:     Effort: Pulmonary effort is normal.     Breath sounds: Normal breath sounds.  Musculoskeletal:        General: Swelling, tenderness and signs of injury present. Normal range of motion.     Cervical back: Normal range of motion and neck supple.     Comments: Large localized contusion to the right anterior lower leg, tender to palpation and bruised in area  Skin:    General: Skin is warm and dry.     Findings: Bruising present.  Neurological:     Mental Status: She is alert and oriented to person, place, and time.  Motor: No weakness.     Gait: Gait normal.     Comments: Right lower extremity neurovascularly intact  Psychiatric:        Mood and Affect: Mood normal.        Thought Content: Thought content normal.        Judgment: Judgment normal.      UC Treatments / Results  Labs (all labs ordered are listed, but only abnormal results are displayed) Labs Reviewed - No data to display  EKG   Radiology No results found.  Procedures Procedures (including critical care time)  Medications Ordered in UC Medications - No data to display  Initial Impression / Assessment and Plan / UC Course  I have  reviewed the triage vital signs and the nursing notes.  Pertinent labs & imaging results that were available during my care of the patient were reviewed by me and considered in my medical decision making (see chart for details).     Ice, elevation, ibuprofen and Tylenol for pain.  Flexeril given for muscle aches from fall.  X-ray imaging deferred with shared decision making as very minimal concern for bony injury Final Clinical Impressions(s) / UC Diagnoses   Final diagnoses:  Contusion of right lower leg, initial encounter     Discharge Instructions      Apply ice off-and-on to the area, elevate at rest to help with swelling, ibuprofen and Tylenol as needed.  Zanaflex for muscle aches from the fall.    ED Prescriptions     Medication Sig Dispense Auth. Provider   ibuprofen (ADVIL) 800 MG tablet Take 1 tablet (800 mg total) by mouth every 8 (eight) hours as needed. 30 tablet Particia Nearing, [place]   tiZANidine (ZANAFLEX) 4 MG capsule Take 1 capsule (4 mg total) by mouth 3 (three) times daily as needed for muscle spasms. Do not drink alcohol or drive while taking this medication.  May cause drowsiness. 15 capsule Particia Nearing, [place]      PDMP not reviewed this encounter.   Particia Nearing, [place] 07/25/22 1430

## 2022-07-26 ENCOUNTER — Other Ambulatory Visit (HOSPITAL_COMMUNITY): Payer: Self-pay | Admitting: Internal Medicine

## 2022-07-26 DIAGNOSIS — Z1231 Encounter for screening mammogram for malignant neoplasm of breast: Secondary | ICD-10-CM

## 2022-07-28 ENCOUNTER — Ambulatory Visit (HOSPITAL_COMMUNITY)
Admission: RE | Admit: 2022-07-28 | Discharge: 2022-07-28 | Disposition: A | Discharge status: Home / Self Care | Payer: BC Managed Care – PPO | Source: Ambulatory Visit | Attending: Internal Medicine | Admitting: Internal Medicine

## 2022-07-28 ENCOUNTER — Encounter (HOSPITAL_COMMUNITY): Payer: Self-pay

## 2022-07-28 ENCOUNTER — Encounter (HOSPITAL_COMMUNITY): Payer: BC Managed Care – PPO

## 2022-07-28 DIAGNOSIS — Z1231 Encounter for screening mammogram for malignant neoplasm of breast: Secondary | ICD-10-CM

## 2022-07-31 ENCOUNTER — Other Ambulatory Visit: Payer: Self-pay | Admitting: Cardiology

## 2022-08-06 ENCOUNTER — Other Ambulatory Visit: Payer: Self-pay | Admitting: Cardiology

## 2022-08-24 ENCOUNTER — Other Ambulatory Visit: Payer: Self-pay

## 2022-08-24 ENCOUNTER — Encounter: Payer: Self-pay | Admitting: Internal Medicine

## 2022-08-24 DIAGNOSIS — E1159 Type 2 diabetes mellitus with other circulatory complications: Secondary | ICD-10-CM

## 2022-08-24 MED ORDER — FREESTYLE LIBRE 3 SENSOR MISC
1.0000 | 3 refills | Status: DC
Start: 2022-08-24 — End: 2023-08-10

## 2022-09-03 ENCOUNTER — Other Ambulatory Visit: Payer: Self-pay | Admitting: Internal Medicine

## 2022-09-09 ENCOUNTER — Other Ambulatory Visit: Payer: Self-pay

## 2022-09-09 ENCOUNTER — Encounter: Payer: Self-pay | Admitting: Internal Medicine

## 2022-09-09 MED ORDER — DAPAGLIFLOZIN PROPANEDIOL 10 MG PO TABS: 10.0000 mg | ORAL_TABLET | Freq: Every day | ORAL | 0 refills | Status: DC

## 2022-09-22 ENCOUNTER — Encounter: Payer: Self-pay | Admitting: Cardiology

## 2022-09-22 ENCOUNTER — Ambulatory Visit: Payer: BC Managed Care – PPO | Attending: Cardiology | Admitting: Cardiology

## 2022-09-22 VITALS — BP 138/62 | HR 89 | Ht 64.0 in | Wt 196.0 lb

## 2022-09-22 DIAGNOSIS — I350 Nonrheumatic aortic (valve) stenosis: Secondary | ICD-10-CM | POA: Diagnosis not present

## 2022-09-22 DIAGNOSIS — I25119 Atherosclerotic heart disease of native coronary artery with unspecified angina pectoris: Secondary | ICD-10-CM | POA: Diagnosis not present

## 2022-09-22 DIAGNOSIS — I471 Supraventricular tachycardia, unspecified: Secondary | ICD-10-CM | POA: Diagnosis not present

## 2022-09-22 DIAGNOSIS — E782 Mixed hyperlipidemia: Secondary | ICD-10-CM

## 2022-09-22 NOTE — Patient Instructions (Signed)
Medication Instructions:  Your physician recommends that you continue on your current medications as directed. Please refer to the Current Medication list given to you today.   Labwork: None today  Testing/Procedures: None today  Follow-Up: 6 months  Any Other Special Instructions Will Be Listed Below (If Applicable).  If you need a refill on your cardiac medications before your next appointment, please call your pharmacy.  

## 2022-09-22 NOTE — Progress Notes (Signed)
Cardiology Office Note  Date: 09/22/2022   ID: Morgan, Aguilar 03/20/1955, MRN [id number]  History of Present Illness: [name] is a 67 y.o. female last seen in February.  She is here for a routine visit.  Since last encounter she does not report any obvious angina, no increasing dyspnea on exertion.  Only a rare sense of very brief palpitations.  No dizziness or syncope.  I reviewed her medications which are stable from a cardiac perspective.  She states that she has lost about 40 pounds since being on Mounjaro.  No intolerances to Lipitor, LDL was 60 in October 2023.  Follow-up echocardiogram in October 2023 revealed mild aortic stenosis.  Physical Exam: VS:  BP 138/62 (BP Location: Left Arm, Patient Position: Sitting, Cuff Size: Normal)   Pulse 89   Ht 5\' 4"  (1.626 m)   Wt 196 lb (88.9 kg)   SpO2 92%   BMI 33.64 kg/m , BMI Body mass index is 33.64 kg/m.  Wt Readings from Last 3 Encounters:  09/22/22 196 lb (88.9 kg)  07/12/22 197 lb 3.2 oz (89.4 kg)  03/17/22 213 lb 3.2 oz (96.7 kg)    General: Patient appears comfortable at rest. HEENT: Conjunctiva and lids normal. Neck: Supple, no elevated JVP or carotid bruits. Lungs: Clear to auscultation, nonlabored breathing at rest. Cardiac: Regular rate and rhythm, no S3, 3/6 systolic murmur. Extremities: No pitting edema.  ECG:  An ECG dated 11/21/2021 was personally reviewed today and demonstrated:  Sinus rhythm with rightward axis, nonspecific T wave changes.  Labwork: 11/03/2021: TSH 2.790 11/22/2021: ALT 21; AST 17; BUN 20; Creatinine, Ser 0.83; Hemoglobin 14.8; Magnesium 2.2; Platelets 232; Potassium 4.0; Sodium 141     Component Value Date/Time   CHOL 118 11/03/2021 1126   TRIG 97 11/03/2021 1126   HDL 40 11/03/2021 1126   CHOLHDL 3.0 11/03/2021 1126   CHOLHDL 2.8 11/07/2015 1037   VLDL 31 (H) 11/07/2015 1037   LDLCALC 60 11/03/2021 1126   Other Studies Reviewed Today:  Echocardiogram 11/22/2021:   1. Abnormal septal motion . Left ventricular ejection fraction, by  estimation, is 55 to 60%. The left ventricle has normal function. The left  ventricle has no regional wall motion abnormalities. Left ventricular  diastolic parameters were normal.   2. Right ventricular systolic function is normal. The right ventricular  size is normal. There is severely elevated pulmonary artery systolic  pressure.   3. Left atrial size was mildly dilated.   4. The mitral valve is abnormal. No evidence of mitral valve  regurgitation. No evidence of mitral stenosis.   5. Gradient lower than seen on TTE done 08/28/18. The aortic valve is  tricuspid. There is moderate calcification of the aortic valve. There is  moderate thickening of the aortic valve. Aortic valve regurgitation is not  visualized. Mild aortic valve  stenosis.   6. The inferior vena cava is dilated in size with >50% respiratory  variability, suggesting right atrial pressure of 8 mmHg.   Assessment and Plan:  1.  CAD status post prior anterior STEMI and DES to the LAD in 2018.  She reports no active angina at this time, LVEF 55 to 60% by echocardiogram in October 2023.  Continue observation on medical therapy including aspirin, Lipitor, Farxiga, Imdur, and Toprol-XL.   2.  Brief episodes of PSVT documented by previous cardiac monitor.  No progressive or prolonged palpitations in the interim.  She remains on Toprol-XL.   3.  Mild  calcific aortic stenosis.  Mean AV gradient 8.5 mmHg by echocardiogram in October 2023.  She is asymptomatic.  4.  Mixed hyperlipidemia, LDL 60 in October 2023.  Continue Lipitor.  Disposition:  Follow up  6 months.  Signed, Jonelle Sidle, M.D., F.A.C.C. [facility]

## 2022-10-01 ENCOUNTER — Encounter: Payer: Self-pay | Admitting: Internal Medicine

## 2022-10-01 MED ORDER — NYSTATIN 100000 UNIT/GM EX CREA: 1.0000 | TOPICAL_CREAM | Freq: Two times a day (BID) | CUTANEOUS | 0 refills | Status: DC

## 2022-10-17 ENCOUNTER — Ambulatory Visit: Payer: Self-pay

## 2022-10-18 ENCOUNTER — Ambulatory Visit (INDEPENDENT_AMBULATORY_CARE_PROVIDER_SITE_OTHER): Payer: BC Managed Care – PPO | Admitting: Internal Medicine

## 2022-10-18 ENCOUNTER — Encounter: Payer: Self-pay | Admitting: Internal Medicine

## 2022-10-18 VITALS — BP 112/70 | HR 80 | Temp 98.3°F | Resp 17 | Ht 64.0 in | Wt 207.0 lb

## 2022-10-18 DIAGNOSIS — F17200 Nicotine dependence, unspecified, uncomplicated: Secondary | ICD-10-CM

## 2022-10-18 DIAGNOSIS — E1159 Type 2 diabetes mellitus with other circulatory complications: Secondary | ICD-10-CM

## 2022-10-18 DIAGNOSIS — I10 Essential (primary) hypertension: Secondary | ICD-10-CM

## 2022-10-18 DIAGNOSIS — E1165 Type 2 diabetes mellitus with hyperglycemia: Secondary | ICD-10-CM | POA: Diagnosis not present

## 2022-10-18 DIAGNOSIS — J069 Acute upper respiratory infection, unspecified: Secondary | ICD-10-CM | POA: Diagnosis not present

## 2022-10-18 DIAGNOSIS — Z7984 Long term (current) use of oral hypoglycemic drugs: Secondary | ICD-10-CM

## 2022-10-18 MED ORDER — TIRZEPATIDE 10 MG/0.5ML ~~LOC~~ SOAJ
10.0000 mg | SUBCUTANEOUS | 1 refills | Status: DC
Start: 2022-10-18 — End: 2022-11-09

## 2022-10-18 NOTE — Assessment & Plan Note (Signed)
A1c 6.8 on labs from June.  She is currently prescribed Mounjaro 7.5 mg weekly, metformin 1000 mg twice daily, and Farxiga 10 mg daily.  She is pleased with her progress, but would like to continue efforts aimed at weight loss.  She has noted recent weight gain of 10 pounds. -Through shared decision making, Greggory Keen has been increased to 10 mg weekly.  Continue metformin and Marcelline Deist as currently prescribed. -Repeat urine microalbumin/creatinine ratio ordered today

## 2022-10-18 NOTE — Patient Instructions (Signed)
It was a pleasure to see you today.  Thank you for giving [place] the opportunity to be involved in your care.  Below is a brief recap of your visit and next steps.  We will plan to see you again in 3 months.  Summary Increase Mounjnaro to 10 mg weekly Check urine study COVID test - further treatment pending results Recommend adequate hydration, as needed cough / cold medications and tylenol, and plenty of rest Follow up in 3 months for annual exam

## 2022-10-18 NOTE — Progress Notes (Signed)
Established Patient Office Visit  Subjective   Patient ID: [name], female    DOB: 02/04/1955  Age: 67 y.o. MRN: [id number]  Chief Complaint  Patient presents with   chest congestion     Started Friday evening with fever of 101 and chest tightness and late yesterday she started with sinus congestion, chest congestion, cough and left ear pain. Has not taken a covid test   Ms. [name] returns to care today for routine follow-up.  She was last evaluated by me on 6/17.  No medication changes were made at that time and 2-month follow-up was arranged.  In the interim, she presented to the urgent care on 6/30 in the setting of a fall.  She has also been seen by cardiology for follow-up since her last appointment.  There have otherwise been no acute interval events.  Ms. [name] reports feeling poorly today.  She endorses URI symptoms since Friday (9/20).  She endorses sinus congestion, nonproductive cough, chest tightness, shortness of breath, fatigue, and fever with Tmax 101F.  She has tried taking OTC cough/cold medication and Tylenol for symptom relief.  She attended a conference last week.  She does not have additional concerns to discuss today.  Past Medical History:  Diagnosis Date   Anxiety    Aortic stenosis    CAD (coronary artery disease)    a. 10/2014: Ant STEMI s/p DES to dLAD   Carotid disease, bilateral (HCC)    a. Duplex 12/2014: mild-mod atherosclerotic plaque without hemodynamically significant stenosis.   COPD (chronic obstructive pulmonary disease) (HCC)    Essential hypertension    HLD (hyperlipidemia)    Morbid obesity (HCC)    Myocardial infarction (HCC) 11/17/2014   PONV (postoperative nausea and vomiting)    Shingles    Sleep apnea    Uses CPAP   Type 2 diabetes mellitus [facility])    Past Surgical History:  Procedure Laterality Date   BREAST BIOPSY Left    2016   CARDIAC CATHETERIZATION N/A 11/17/2014   Procedure: Left Heart Cath and Coronary Angiography;   Surgeon: Iran Ouch, MD;  Location: [facility] INVASIVE CV LAB;  Service: Cardiovascular;  Laterality: N/A;   CARDIAC CATHETERIZATION N/A 11/17/2014   Procedure: Coronary Stent Intervention;  Surgeon: Iran Ouch, MD;  Location: [facility] INVASIVE CV LAB;  Service: Cardiovascular;  Laterality: N/A;  distal lad 2.25x20 promus   CATARACT EXTRACTION W/PHACO Left 09/20/2016   Procedure: CATARACT EXTRACTION PHACO AND INTRAOCULAR LENS PLACEMENT (IOC);  Surgeon: Gemma Payor, MD;  Location: [facility] ORS;  Service: Ophthalmology;  Laterality: Left;  CDE: 9.27   CATARACT EXTRACTION W/PHACO Right 10/18/2016   Procedure: CATARACT EXTRACTION PHACO AND INTRAOCULAR LENS PLACEMENT (IOC);  Surgeon: Gemma Payor, MD;  Location: [facility] ORS;  Service: Ophthalmology;  Laterality: Right;  CDE: 6.38   CHOLECYSTECTOMY     COLONOSCOPY WITH PROPOFOL N/A 02/16/2021   Procedure: COLONOSCOPY WITH PROPOFOL;  Surgeon: Lanelle Bal, DO;  Location: [facility] ENDO [facility];  Service: Endoscopy;  Laterality: N/A;  1:30 / ASA 3   POLYPECTOMY  02/16/2021   Procedure: POLYPECTOMY;  Surgeon: Lanelle Bal, DO;  Location: [facility] END[facility] SU[facility];  Service: Endoscopy;;   SPINAL FUSION     cervical; screws and plates.   TOTAL ABDOMINAL HYSTERECTOMY  1980   Social History   Tobacco Use   Smoking status: Every Day    Current packs/day: 0.50    Average packs/day: 0.5 packs/day for 49.8 years (24.9 ttl pk-yrs)    Types:  Cigarettes    Start date: 12/26/1972   Smokeless tobacco: Never   Tobacco comments:    3 cigs per day  Vaping Use   Vaping status: Never Used  Substance Use Topics   Alcohol use: No    Alcohol/week: 0.0 standard drinks of alcohol   Drug use: No   Family History  Problem Relation Age of Onset   Heart attack Father    Heart disease Father    Cancer Brother    Allergies  Allergen Reactions   Codeine Nausea And Vomiting   Sulfa Antibiotics Rash   Review of Systems  Constitutional:  Positive for chills, fever and malaise/fatigue.   HENT:  Positive for congestion and sore throat.   Respiratory:  Positive for cough. Negative for sputum production and shortness of breath.      Objective:     BP 112/70   Pulse 80   Temp 98.3 F (36.8 C) (Oral)   Resp 17   Ht 5\' 4"  (1.626 m)   Wt 207 lb (93.9 kg)   SpO2 90%   BMI 35.53 kg/m  BP Readings from Last 3 Encounters:  10/18/22 112/70  09/22/22 138/62  07/25/22 134/75   Physical Exam Vitals reviewed.  Constitutional:      General: She is not in acute distress.    Appearance: Normal appearance. She is obese. She is ill-appearing. She is not toxic-appearing.  HENT:     Head: Normocephalic and atraumatic.     Right Ear: External ear normal.     Left Ear: External ear normal.     Nose: Congestion and rhinorrhea present.     Mouth/Throat:     Mouth: Mucous membranes are moist.     Pharynx: No oropharyngeal exudate or posterior oropharyngeal erythema.  Eyes:     General: No scleral icterus.    Extraocular Movements: Extraocular movements intact.     Conjunctiva/sclera: Conjunctivae normal.     Pupils: Pupils are equal, round, and reactive to light.  Cardiovascular:     Rate and Rhythm: Normal rate and regular rhythm.     Pulses: Normal pulses.     Heart sounds: Murmur heard.     No friction rub. No gallop.  Pulmonary:     Effort: Pulmonary effort is normal.     Breath sounds: Wheezing present. No rhonchi or rales.  Abdominal:     General: Abdomen is flat. Bowel sounds are normal. There is no distension.     Palpations: Abdomen is soft.     Tenderness: There is no abdominal tenderness.  Musculoskeletal:        General: No swelling. Normal range of motion.     Cervical back: Normal range of motion.     Right lower leg: No edema.     Left lower leg: No edema.  Lymphadenopathy:     Cervical: No cervical adenopathy.  Skin:    General: Skin is warm and dry.     Capillary Refill: Capillary refill takes less than 2 seconds.     Coloration: Skin is not  jaundiced.  Neurological:     General: No focal deficit present.     Mental Status: She is alert and oriented to person, place, and time.  Psychiatric:        Mood and Affect: Mood normal.        Behavior: Behavior normal.   Last CBC Lab Results  Component Value Date   WBC 6.3 11/22/2021   HGB 14.8 11/22/2021  HCT 46.4 (H) 11/22/2021   MCV 93.9 11/22/2021   MCH 30.0 11/22/2021   RDW 15.3 11/22/2021   PLT 232 11/22/2021   Last metabolic panel Lab Results  Component Value Date   GLUCOSE 113 (H) 11/22/2021   NA 141 11/22/2021   K 4.0 11/22/2021   CL 107 11/22/2021   CO2 25 11/22/2021   BUN 20 11/22/2021   CREATININE 0.83 11/22/2021   GFRNONAA >60 11/22/2021   CALCIUM 8.9 11/22/2021   PHOS 4.9 (H) 11/22/2021   PROT 6.6 11/22/2021   ALBUMIN 3.5 11/22/2021   LABGLOB 2.8 11/03/2021   AGRATIO 1.5 11/03/2021   BILITOT 0.6 11/22/2021   ALKPHOS 75 11/22/2021   AST 17 11/22/2021   ALT 21 11/22/2021   ANIONGAP 9 11/22/2021   Last lipids Lab Results  Component Value Date   CHOL 118 11/03/2021   HDL 40 11/03/2021   LDLCALC 60 11/03/2021   TRIG 97 11/03/2021   CHOLHDL 3.0 11/03/2021   Last hemoglobin A1c Lab Results  Component Value Date   HGBA1C 6.8 (A) 07/12/2022   Last thyroid functions Lab Results  Component Value Date   TSH 2.790 11/03/2021   Last vitamin D Lab Results  Component Value Date   VD25OH 61.8 11/03/2021     Assessment & Plan:   Problem List Items Addressed This Visit       Essential hypertension    BP remains adequately controlled on current antihypertensive regimen.  No medication changes are indicated today.      URI (upper respiratory infection)    As noted above, patient endorses a 3-day history of symptoms concerning for URI.  She specifically endorses sinus/nasal congestion, nonproductive cough, shortness of breath, chest tightness, lethargy, and fever with Tmax 101F.  She attended a conference last week.  Patient is vaccinated  against COVID-19. -COVID/influenza testing ordered today.  She would qualify for Paxlovid if COVID-19 positive. -Recommend continued supportive care measures and isolation -Further treatment pending test results      Type 2 diabetes mellitus with hyperglycemia (HCC)    A1c 6.8 on labs from June.  She is currently prescribed Mounjaro 7.5 mg weekly, metformin 1000 mg twice daily, and Farxiga 10 mg daily.  She is pleased with her progress, but would like to continue efforts aimed at weight loss.  She has noted recent weight gain of 10 pounds. -Through shared decision making, Greggory Keen has been increased to 10 mg weekly.  Continue metformin and Marcelline Deist as currently prescribed. -Repeat urine microalbumin/creatinine ratio ordered today      Current smoker    Currently smoking less than 0.5 packs/day of cigarettes.  She has been smoking since age 55 and has mostly smoked 1 pack/day over this time.  She continues to express a desire to achieve complete cessation.  She has tried multiple NRT products previously as well as Chantix and Wellbutrin. -Complete smoking cessation was encouraged again today -Patient is in agreement with lung cancer screening.  Will contact the lung cancer screening program.       Return in about 3 months (around 01/17/2023) for CPE.    Billie Lade, MD

## 2022-10-18 NOTE — Assessment & Plan Note (Signed)
BP remains adequately controlled on current antihypertensive regimen.  No medication changes are indicated today.

## 2022-10-18 NOTE — Assessment & Plan Note (Signed)
Currently smoking less than 0.5 packs/day of cigarettes.  She has been smoking since age 67 and has mostly smoked 1 pack/day over this time.  She continues to express a desire to achieve complete cessation.  She has tried multiple NRT products previously as well as Chantix and Wellbutrin. -Complete smoking cessation was encouraged again today -Patient is in agreement with lung cancer screening.  Will contact the lung cancer screening program.

## 2022-10-18 NOTE — Assessment & Plan Note (Signed)
As noted above, patient endorses a 3-day history of symptoms concerning for URI.  She specifically endorses sinus/nasal congestion, nonproductive cough, shortness of breath, chest tightness, lethargy, and fever with Tmax 101F.  She attended a conference last week.  Patient is vaccinated against COVID-19. -COVID/influenza testing ordered today.  She would qualify for Paxlovid if COVID-19 positive. -Recommend continued supportive care measures and isolation -Further treatment pending test results

## 2022-10-18 NOTE — Addendum Note (Signed)
Addended by: Abner Greenspan on: 10/18/2022 02:29 PM   Modules accepted: Orders

## 2022-10-20 LAB — COVID-19, FLU A+B AND RSV
Influenza A, NAA: NOT DETECTED
Influenza B, NAA: NOT DETECTED
RSV, NAA: NOT DETECTED
SARS-CoV-2, NAA: NOT DETECTED

## 2022-10-20 LAB — MICROALBUMIN / CREATININE URINE RATIO
Creatinine, Urine: 49.5 mg/dL
Microalb/Creat Ratio: 114 mg/g creat — ABNORMAL HIGH (ref 0–29)
Microalbumin, Urine: 56.5 ug/mL

## 2022-10-26 ENCOUNTER — Other Ambulatory Visit: Payer: Self-pay | Admitting: Internal Medicine

## 2022-10-26 DIAGNOSIS — I5031 Acute diastolic (congestive) heart failure: Secondary | ICD-10-CM

## 2022-10-29 DIAGNOSIS — J189 Pneumonia, unspecified organism: Secondary | ICD-10-CM | POA: Diagnosis not present

## 2022-11-01 ENCOUNTER — Other Ambulatory Visit: Payer: Self-pay | Admitting: Internal Medicine

## 2022-11-01 DIAGNOSIS — F419 Anxiety disorder, unspecified: Secondary | ICD-10-CM

## 2022-11-03 ENCOUNTER — Other Ambulatory Visit (HOSPITAL_COMMUNITY): Payer: Self-pay | Admitting: Preventative Medicine

## 2022-11-03 ENCOUNTER — Ambulatory Visit (HOSPITAL_COMMUNITY)
Admission: RE | Admit: 2022-11-03 | Discharge: 2022-11-03 | Disposition: A | Discharge status: Home / Self Care | Payer: BC Managed Care – PPO | Source: Ambulatory Visit | Attending: Preventative Medicine | Admitting: Preventative Medicine

## 2022-11-03 DIAGNOSIS — R9389 Abnormal findings on diagnostic imaging of other specified body structures: Secondary | ICD-10-CM | POA: Diagnosis not present

## 2022-11-03 DIAGNOSIS — I700 Atherosclerosis of aorta: Secondary | ICD-10-CM | POA: Diagnosis not present

## 2022-11-03 DIAGNOSIS — R918 Other nonspecific abnormal finding of lung field: Secondary | ICD-10-CM | POA: Diagnosis not present

## 2022-11-03 LAB — POCT I-STAT CREATININE: Creatinine, Ser: 0.8 mg/dL (ref 0.44–1.00)

## 2022-11-03 MED ORDER — IOHEXOL 300 MG/ML  SOLN
75.0000 mL | Freq: Once | INTRAMUSCULAR | Status: AC | PRN
  Administered 2022-11-03: 75 mL via INTRAVENOUS

## 2022-11-04 DIAGNOSIS — R0902 Hypoxemia: Secondary | ICD-10-CM | POA: Diagnosis not present

## 2022-11-04 DIAGNOSIS — G4733 Obstructive sleep apnea (adult) (pediatric): Secondary | ICD-10-CM | POA: Diagnosis not present

## 2022-11-04 DIAGNOSIS — R911 Solitary pulmonary nodule: Secondary | ICD-10-CM | POA: Diagnosis not present

## 2022-11-04 DIAGNOSIS — R918 Other nonspecific abnormal finding of lung field: Secondary | ICD-10-CM | POA: Diagnosis not present

## 2022-11-04 DIAGNOSIS — J189 Pneumonia, unspecified organism: Secondary | ICD-10-CM | POA: Diagnosis not present

## 2022-11-05 DIAGNOSIS — J42 Unspecified chronic bronchitis: Secondary | ICD-10-CM | POA: Diagnosis not present

## 2022-11-05 DIAGNOSIS — J189 Pneumonia, unspecified organism: Secondary | ICD-10-CM | POA: Diagnosis not present

## 2022-11-05 DIAGNOSIS — R918 Other nonspecific abnormal finding of lung field: Secondary | ICD-10-CM | POA: Diagnosis not present

## 2022-11-08 NOTE — Progress Notes (Unsigned)
[name], female    DOB: Oct 24, 1955    MRN: [id number]   Brief patient profile:  61  yowf  active smoker  referred to pulmonary clinic in [place]  11/09/2022 by Dr Durwin Nora for new doe Jan 2024 corrected with Virgel Bouquet then Sept 21st with abrupt chest and sinus congestion > yellow mucus rocepin/ amox then levaquin and prednisone and completing 11/10/22.     History of Present Illness  11/09/2022  Pulmonary/ 1st office eval/ [name] / [place] [facility]  Chief Complaint  Patient presents with   Consult  Dyspnea:  85% better and still on last doses of abx /pred Cough: still some light green mucus with sense of nasal/ chest congestion on mucinex dm  Sleep: 30 degrees hob  SABA use: couple times a day at most  02: none  Lung cancer screen: per PCP but would not start until sort out the present problems   No obvious day to day or daytime pattern/variability or assoc  mucus plugs or hemoptysis or cp or chest tightness, subjective wheeze or overt sinus or hb symptoms.    Also denies any obvious fluctuation of symptoms with weather or environmental changes or other aggravating or alleviating factors except as outlined above   No unusual exposure hx or h/o childhood pna/ asthma or knowledge of premature birth.  Current Allergies, Complete Past Medical History, Past Surgical History, Family History, and Social History were reviewed in [employer] record.  ROS  The following are not active complaints unless bolded Hoarseness, sore throat, dysphagia, dental problems, itching, sneezing,  nasal congestion or discharge of excess mucus or purulent secretions, ear ache,   fever, chills, sweats, unintended wt loss or wt gain, classically pleuritic or exertional cp,  orthopnea pnd or arm/hand swelling  or leg swelling, presyncope, palpitations, abdominal pain, anorexia, nausea, vomiting, diarrhea  or change in bowel habits or change in bladder habits, change in stools or change in  urine, dysuria, hematuria,  rash, arthralgias, visual complaints, headache, numbness, weakness or ataxia or problems with walking or coordination,  change in mood or  memory.            Outpatient Medications Prior to Visit  Medication Sig Dispense Refill   albuterol (VENTOLIN HFA) 108 (90 Base) MCG/ACT inhaler Inhale 2 puffs into the lungs every 6 (six) hours as needed for wheezing or shortness of breath. 8 g 3   aspirin EC 81 MG tablet Take 81 mg by mouth daily.     atorvastatin (LIPITOR) 40 MG tablet TAKE 1 TABLET (40 MG TOTAL) BY MOUTH EVERY OTHER DAY. TAKE AT DINNER 45 tablet 1   Continuous Glucose Sensor (FREESTYLE LIBRE 3 SENSOR) MISC 1 each by Does not apply route every 14 (fourteen) days. Place 1 sensor on the skin every 14 days. Use to check glucose continuously 6 each 3   dapagliflozin propanediol (FARXIGA) 10 MG TABS tablet Take 1 tablet (10 mg total) by mouth daily. 90 tablet 0   escitalopram (LEXAPRO) 20 MG tablet TAKE 1 TABLET (20 MG TOTAL) BY MOUTH DAILY AT 6 (SIX) AM. 90 tablet 1   fluticasone (FLONASE) 50 MCG/ACT nasal spray Place 1 spray into both nostrils daily. 18 g 0   fluticasone furoate-vilanterol (BREO ELLIPTA) 100-25 MCG/ACT AEPB Inhale 1 puff into the lungs daily. 28 each 3   furosemide (LASIX) 20 MG tablet TAKE 1 TABLET BY MOUTH EVERY DAY 90 tablet 3   ibuprofen (ADVIL) 800 MG tablet Take 1 tablet (800  mg total) by mouth every 8 (eight) hours as needed. 30 tablet 0   isosorbide mononitrate (IMDUR) 30 MG 24 hr tablet TAKE 1 TABLET BY MOUTH EVERY DAY 90 tablet 1   lisinopril (ZESTRIL) 2.5 MG tablet Take 1 tablet (2.5 mg total) by mouth daily. 90 tablet 3   metFORMIN (GLUCOPHAGE) 1000 MG tablet TAKE 1 TABLET BY MOUTH TWICE A DAY 180 tablet 0   metoprolol succinate (TOPROL-XL) 25 MG 24 hr tablet Take 25 mg by mouth daily.     nystatin cream (MYCOSTATIN) Apply 1 Application topically 2 (two) times daily. 30 g 0   tirzepatide (MOUNJARO) 10 MG/0.5ML Pen Inject 10 mg into  the skin once a week. 6 mL 1   No facility-administered medications prior to visit.    Past Medical History:  Diagnosis Date   Anxiety    Aortic stenosis    CAD (coronary artery disease)    a. 10/2014: Ant STEMI s/p DES to dLAD   Carotid disease, bilateral (HCC)    a. Duplex 12/2014: mild-mod atherosclerotic plaque without hemodynamically significant stenosis.   COPD (chronic obstructive pulmonary disease) (HCC)    Essential hypertension    HLD (hyperlipidemia)    Morbid obesity (HCC)    Myocardial infarction (HCC) 11/17/2014   PONV (postoperative nausea and vomiting)    Shingles    Sleep apnea    Uses CPAP   Type 2 diabetes mellitus (HCC)       Objective:     BP 135/69   Pulse 93   Ht 5\' 4"  (1.626 m)   Wt 200 lb 6.4 oz (90.9 kg)   SpO2 92%   BMI 34.40 kg/m   SpO2: 92 % RA  Amb pleasant wf  congested sounding cough / mild pseudowheeze  HEENT : Oropharynx  clear   Nasal turbinates min MP secretions   NECK :  without  apparent JVD/ palpable Nodes/TM    LUNGS: no acc muscle use,  Min barrel  contour chest wall with bilateral  minimal insp/exp rhonchi and  without cough on insp or exp maneuvers and min  Hyperresonant  to  percussion bilaterally    CV:  RRR  no s3 or murmur or increase in P2, and no edema   ABD:  soft and nontender    MS:  Nl gait/ ext warm without deformities Or obvious joint restrictions  calf tenderness, cyanosis or clubbing    SKIN: warm and dry without lesions    NEURO:  alert, approp, nl sensorium with  no motor or cerebellar deficits apparent.         I personally reviewed images and agree with radiology impression as follows:  CXR:   pa and lateral 10/29/22 no pna CT w contrast 11/03/22 1. Area of enhancing soft tissue involving the left lung base, possibly focal atelectasis, although pulmonary nodule can not be excluded. Follow-up chest CT is recommended in 3 months to ensure stability or resolution. 2. Mild ground-glass opacities  of the posterior right upper lobe and left lower lobe, likely infectious or inflammatory. 3. Dilated main pulmonary artery, which can be seen in the setting of pulmonary hypertension. 4. Coronary artery calcifications and aortic Atherosclerosis        Assessment   Asthmatic bronchitis , chronic (HCC) Active smoker - 11/09/2022  After extensive coaching inhaler device,  effectiveness =    60% short > try off breo due to cough and start symbicort 80 or dulera 100 2bid and return for pfts  Does not have features of copd and more cough than doe so symbicort 80 plus mucinex dm all that's needed here Pending pfts  Solitary pulmonary nodule on lung CT Active smoker with 11/03/22 LLL nodule in setting of CAP  > f/u CT  ordered for 12/04/22   Most likely inflammatory and need f/u 3 m but no bx or PET for now in setting of recent infection  Discussed in detail all the  indications, usual  risks and alternatives  relative to the benefits with patient who agrees to proceed with w/u as outlined.     Cigarette smoker Counseled re importance of smoking cessation but did not meet time criteria for separate billing      Aortic stenosis Mild by 2D ECHO 11/17/14:  - repeat Echo 11/22/21  Mild AS/ LAE and Mod PAS elevation   Concerned with Mod PAS elevation (?primary or secondary)  but will address this @ next ov  p resolve all her present / acute issues     Each maintenance medication was reviewed in detail including emphasizing most importantly the difference between maintenance and prns and under what circumstances the prns are to be triggered using an action plan format where appropriate.  Total time for H and P, chart review, counseling, reviewing hfa device(s) and generating customized AVS unique to this office visit / same day charting = 45 min new pt eval             Sandrea Hughs, MD 11/09/2022

## 2022-11-09 ENCOUNTER — Encounter: Payer: Self-pay | Admitting: Internal Medicine

## 2022-11-09 ENCOUNTER — Ambulatory Visit (INDEPENDENT_AMBULATORY_CARE_PROVIDER_SITE_OTHER): Payer: BC Managed Care – PPO | Admitting: Internal Medicine

## 2022-11-09 ENCOUNTER — Other Ambulatory Visit: Payer: Self-pay | Admitting: Internal Medicine

## 2022-11-09 VITALS — BP 135/69 | HR 93 | Ht 64.0 in | Wt 200.4 lb

## 2022-11-09 DIAGNOSIS — R911 Solitary pulmonary nodule: Secondary | ICD-10-CM | POA: Insufficient documentation

## 2022-11-09 DIAGNOSIS — J4489 Other specified chronic obstructive pulmonary disease: Secondary | ICD-10-CM | POA: Diagnosis not present

## 2022-11-09 DIAGNOSIS — F17210 Nicotine dependence, cigarettes, uncomplicated: Secondary | ICD-10-CM | POA: Diagnosis not present

## 2022-11-09 DIAGNOSIS — I350 Nonrheumatic aortic (valve) stenosis: Secondary | ICD-10-CM | POA: Diagnosis not present

## 2022-11-09 MED ORDER — BUDESONIDE-FORMOTEROL FUMARATE 80-4.5 MCG/ACT IN AERO
INHALATION_SPRAY | RESPIRATORY_TRACT | 12 refills | Status: DC
Start: 2022-11-09 — End: 2022-11-29

## 2022-11-09 MED ORDER — BREZTRI AEROSPHERE 160-9-4.8 MCG/ACT IN AERO: 2.0000 | INHALATION_SPRAY | Freq: Two times a day (BID) | RESPIRATORY_TRACT | Status: DC

## 2022-11-09 NOTE — Assessment & Plan Note (Signed)
Active smoker with 11/03/22 LLL nodule in setting of CAP  > f/u CT  ordered for 12/04/22   Most likely inflammatory and need f/u 3 m but no bx or PET for now in setting of recent infection  Discussed in detail all the  indications, usual  risks and alternatives  relative to the benefits with patient who agrees to proceed with w/u as outlined.

## 2022-11-09 NOTE — Assessment & Plan Note (Signed)
Active smoker - 11/09/2022  After extensive coaching inhaler device,  effectiveness =    60% short > try off breo due to cough and start symbicort 80 or dulera 100 2bid and return for pfts  Does not have features of copd and more cough than doe so symbicort 80 plus mucinex dm all that's needed here Pending pfts

## 2022-11-09 NOTE — Assessment & Plan Note (Addendum)
Mild by 2D ECHO 11/17/14:  - repeat Echo 11/22/21  Mild AS/ LAE and Mod PAS elevation   Concerned with Mod PAS elevation (?primary or secondary) but will address this @ next ov  p resolve all her present / acute issues

## 2022-11-09 NOTE — Assessment & Plan Note (Signed)
Counseled re importance of smoking cessation but did not meet time criteria for separate billing    Each maintenance medication was reviewed in detail including emphasizing most importantly the difference between maintenance and prns and under what circumstances the prns are to be triggered using an action plan format where appropriate.  Total time for H and P, chart review, counseling, reviewing hfa device(s) and generating customized AVS unique to this office visit / same day charting = 45 min new pt eval

## 2022-11-09 NOTE — Patient Instructions (Addendum)
Plan A = Automatic = Always=    Breztri (symbicort 80) Take 2 puffs first thing in am and then another 2 puffs about 12 hours later.    Work on inhaler technique:  relax and gently blow all the way out then take a nice smooth full deep breath back in, triggering the inhaler at same time you start breathing in.  Hold breath in for at least  5 seconds if you can. Blow out breztri/ symbicort  thru nose. Rinse and gargle with water when done.  If mouth or throat bother you at all,  try brushing teeth/gums/tongue with arm and hammer toothpaste/ make a slurry and gargle and spit out.   >>>  Remember how golfers warm up by taking practice swings - do this with an empty inhaler      Plan B = Backup (to supplement plan A, not to replace it) Only use your albuterol inhaler as a rescue medication to be used if you can't catch your breath by resting or doing a relaxed purse lip breathing pattern.  - The less you use it, the better it will work when you need it. - Ok to use the inhaler up to 2 puffs  every 4 hours if you must but call for appointment if use goes up over your usual need - Don't leave home without it !!  (think of it like the spare tire for your car)      The key is to stop smoking completely before smoking completely stops you!  For cough/ congestion > mucinex or mucinex dm  up to maximum of  1200 mg every 12 hours as needed   My office will be contacting you by phone for referral for repeat CT 3  in January and PFTs 1st available - if you don't hear back from my office within one week please call us back or notify us thru MyChart and we'll address it right away.    Please schedule a follow up visit in 3 months but call sooner if needed  with all medications /inhalers/ solutions in hand so we can verify exactly what you are taking. This includes all medications from all doctors and over the counters

## 2022-11-10 ENCOUNTER — Telehealth: Payer: Self-pay | Admitting: Internal Medicine

## 2022-11-10 NOTE — Telephone Encounter (Signed)
Left message with appointment information for the Tuesday 01/11/23 2:00 pm PFT study appointment at [facility] time is 1:45 pm at the 1st floor registration desk for check in---will mail appointment information and instructions to patient .  Requested call back if questions.  Information is also available in My Chart.

## 2022-11-22 ENCOUNTER — Encounter: Payer: Self-pay | Admitting: Internal Medicine

## 2022-11-24 ENCOUNTER — Other Ambulatory Visit: Payer: Self-pay | Admitting: Internal Medicine

## 2022-11-25 ENCOUNTER — Encounter: Payer: Self-pay | Admitting: *Deleted

## 2022-11-25 NOTE — Telephone Encounter (Signed)
Dulera 100 Take 2 puffs first thing in am and then another 2 puffs about 12 hours later.   Should be covered and is basically the same med so don't use it and breztri at same time - one or the other until breztri used up then just [place]

## 2022-11-26 ENCOUNTER — Telehealth (INDEPENDENT_AMBULATORY_CARE_PROVIDER_SITE_OTHER): Payer: BC Managed Care – PPO | Admitting: Adult Health

## 2022-11-26 DIAGNOSIS — G4733 Obstructive sleep apnea (adult) (pediatric): Secondary | ICD-10-CM

## 2022-11-26 NOTE — Telephone Encounter (Signed)
When I went to send in the [facility] it gave me a medication safety alert message. I have copied it below. I just want to make sure you are still ok prescribing it.  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 Consider removing the following QT Prolonging medication(s). For more information, providers can review medications that prolong the QT interval at [email or web address] (registration required).

## 2022-11-26 NOTE — Progress Notes (Signed)
PATIENT: [name] DOB: 1955-12-08  REASON FOR VISIT: follow up HISTORY FROM: patient   Virtual Visit via Video Note  I connected with [name] on 11/26/22 at 10:30 AM EDT by a video enabled telemedicine application located remotely at [facility] and verified that I am speaking with the correct person using two identifiers who was located at their own home.   I discussed the limitations of evaluation and management by telemedicine and the availability of in person appointments. The patient expressed understanding and agreed to proceed.   PATIENT: [name] DOB: Jun 18, 1955  REASON FOR VISIT: follow up HISTORY FROM: patient  HISTORY OF PRESENT ILLNESS: Today 11/26/22:  [name] is a 67 y.o. female with a history of OSA on BiPAP. Returns today for follow-up.  Overall she reports that BiPAP is working well for her.  She does have 1 L of oxygen bled into her BiPAP at night.  She states that the oxygen concentrator is messing up.  She feels that she needs a replacement.  Her download is below       HISTORY   REVIEW OF SYSTEMS: Out of a complete 14 system review of symptoms, the patient complains only of the following symptoms, and all other reviewed systems are negative.  ALLERGIES: Allergies  Allergen Reactions   Codeine Nausea And Vomiting   Sulfa Antibiotics Rash    HOME MEDICATIONS: Outpatient Medications Prior to Visit  Medication Sig Dispense Refill   albuterol (VENTOLIN HFA) 108 (90 Base) MCG/ACT inhaler Inhale 2 puffs into the lungs every 6 (six) hours as needed for wheezing or shortness of breath. 8 g 3   aspirin EC 81 MG tablet Take 81 mg by mouth daily.     atorvastatin (LIPITOR) 40 MG tablet TAKE 1 TABLET (40 MG TOTAL) BY MOUTH EVERY OTHER DAY. TAKE AT DINNER 45 tablet 1   Budeson-Glycopyrrol-Formoterol (BREZTRI AEROSPHERE) 160-9-4.8 MCG/ACT AERO Inhale 2 puffs into the lungs in the morning and at bedtime. 1 each     budesonide-formoterol (SYMBICORT) 80-4.5 MCG/ACT inhaler Take 2 puffs first thing in am and then another 2 puffs about 12 hours later. 1 each 12   Continuous Glucose Sensor (FREESTYLE LIBRE 3 SENSOR) MISC 1 each by Does not apply route every 14 (fourteen) days. Place 1 sensor on the skin every 14 days. Use to check glucose continuously 6 each 3   dapagliflozin propanediol (FARXIGA) 10 MG TABS tablet Take 1 tablet (10 mg total) by mouth daily. 90 tablet 0   escitalopram (LEXAPRO) 20 MG tablet TAKE 1 TABLET (20 MG TOTAL) BY MOUTH DAILY AT 6 (SIX) AM. 90 tablet 1   fluticasone (FLONASE) 50 MCG/ACT nasal spray Place 1 spray into both nostrils daily. 18 g 0   furosemide (LASIX) 20 MG tablet TAKE 1 TABLET BY MOUTH EVERY DAY 90 tablet 3   ibuprofen (ADVIL) 800 MG tablet Take 1 tablet (800 mg total) by mouth every 8 (eight) hours as needed. 30 tablet 0   isosorbide mononitrate (IMDUR) 30 MG 24 hr tablet TAKE 1 TABLET BY MOUTH EVERY DAY 90 tablet 1   lisinopril (ZESTRIL) 2.5 MG tablet Take 1 tablet (2.5 mg total) by mouth daily. 90 tablet 3   metFORMIN (GLUCOPHAGE) 1000 MG tablet TAKE 1 TABLET BY MOUTH TWICE A DAY 180 tablet 0   metoprolol succinate (TOPROL-XL) 25 MG 24 hr tablet Take 25 mg by mouth daily.     nystatin cream (MYCOSTATIN) Apply 1  Application topically 2 (two) times daily. 30 g 0   No facility-administered medications prior to visit.    PAST MEDICAL HISTORY: Past Medical History:  Diagnosis Date   Anxiety    Aortic stenosis    CAD (coronary artery disease)    a. 10/2014: Ant STEMI s/p DES to dLAD   Carotid disease, bilateral (HCC)    a. Duplex 12/2014: mild-mod atherosclerotic plaque without hemodynamically significant stenosis.   COPD (chronic obstructive pulmonary disease) (HCC)    Essential hypertension    HLD (hyperlipidemia)    Morbid obesity (HCC)    Myocardial infarction (HCC) 11/17/2014   PONV (postoperative nausea and vomiting)    Shingles    Sleep apnea    Uses CPAP    Type 2 diabetes mellitus (HCC)     PAST SURGICAL HISTORY: Past Surgical History:  Procedure Laterality Date   BREAST BIOPSY Left    2016   CARDIAC CATHETERIZATION N/A 11/17/2014   Procedure: Left Heart Cath and Coronary Angiography;  Surgeon: Iran Ouch, MD;  Location: [facility];  Service: Cardiovascular;  Laterality: N/A;   CARDIAC CATHETERIZATION N/A 11/17/2014   Procedure: Coronary Stent Intervention;  Surgeon: Iran Ouch, MD;  Location: [facility];  Service: Cardiovascular;  Laterality: N/A;  distal lad 2.25x20 promus   CATARACT EXTRACTION W/PHACO Left 09/20/2016   Procedure: CATARACT EXTRACTION PHACO AND INTRAOCULAR LENS PLACEMENT (IOC);  Surgeon: Gemma Payor, MD;  Location: [facility];  Service: Ophthalmology;  Laterality: Left;  CDE: 9.27   CATARACT EXTRACTION W/PHACO Right 10/18/2016   Procedure: CATARACT EXTRACTION PHACO AND INTRAOCULAR LENS PLACEMENT (IOC);  Surgeon: Gemma Payor, MD;  Location: [facility];  Service: Ophthalmology;  Laterality: Right;  CDE: 6.38   CHOLECYSTECTOMY     COLONOSCOPY WITH PROPOFOL N/A 02/16/2021   Procedure: COLONOSCOPY WITH PROPOFOL;  Surgeon: Lanelle Bal, DO;  Location: [facility];  Service: Endoscopy;  Laterality: N/A;  1:30 / ASA 3   POLYPECTOMY  02/16/2021   Procedure: POLYPECTOMY;  Surgeon: Lanelle Bal, DO;  Location: [facility];  Service: Endoscopy;;   SPINAL FUSION     cervical; screws and plates.   TOTAL ABDOMINAL HYSTERECTOMY  1980    FAMILY HISTORY: Family History  Problem Relation Age of Onset   Heart attack Father    Heart disease Father    Cancer Brother     SOCIAL HISTORY: Social History   Socioeconomic History   Marital status: Widowed    Spouse name: Not on file   Number of children: Not on file   Years of education: Not on file   Highest education level: Associate degree: occupational, Scientist, product/process development, or vocational program  Occupational History   Occupation: Office work for  [employer]  Tobacco Use   Smoking status: Every Day    Current packs/day: 0.50    Average packs/day: 0.5 packs/day for 49.9 years (25.0 ttl pk-yrs)    Types: Cigarettes    Start date: 12/26/1972   Smokeless tobacco: Never   Tobacco comments:    3 cigs per day  Vaping Use   Vaping status: Never Used  Substance and Sexual Activity   Alcohol use: No    Alcohol/week: 0.0 standard drinks of alcohol   Drug use: No   Sexual activity: Yes    Partners: Male    Birth control/protection: Surgical  Other Topics Concern   Not on file  Social History Narrative   Not on file   Social Determinants of  Health   Financial Resource Strain: High Risk (07/09/2022)   Overall Financial Resource Strain (CARDIA)    Difficulty of Paying Living Expenses: Hard  Food Insecurity: Patient Declined (07/09/2022)   Hunger Vital Sign    Worried About Running Out of Food in the Last Year: Patient declined    Ran Out of Food in the Last Year: Patient declined  Transportation Needs: No Transportation Needs (07/09/2022)   PRAPARE - Administrator, Civil Service (Medical): No    Lack of Transportation (Non-Medical): No  Physical Activity: Insufficiently Active (07/09/2022)   Exercise Vital Sign    Days of Exercise per Week: 3 days    Minutes of Exercise per Session: 30 min  Stress: No Stress Concern Present (07/09/2022)   [employer] of Occupational Health - Occupational Stress Questionnaire    Feeling of Stress : Not at all  Social Connections: Moderately Isolated (07/09/2022)   Social Connection and Isolation Panel [NHANES]    Frequency of Communication with Friends and Family: More than three times a week    Frequency of Social Gatherings with Friends and Family: Once a week    Attends Religious Services: 1 to 4 times per year    Active Member of [employer] or Organizations: No    Attends Banker Meetings: Not on file    Marital Status: Widowed  Intimate Partner Violence:  Not on file      PHYSICAL EXAM Generalized: Well developed, in no acute distress   Neurological examination  Mentation: Alert oriented to time, place, history taking. Follows all commands speech and language fluent Cranial nerve II-XII: Facial symmetry noted  DIAGNOSTIC DATA (LABS, IMAGING, TESTING) - I reviewed patient records, labs, notes, testing and imaging myself where available.  Lab Results  Component Value Date   WBC 6.3 11/22/2021   HGB 14.8 11/22/2021   HCT 46.4 (H) 11/22/2021   MCV 93.9 11/22/2021   PLT 232 11/22/2021      Component Value Date/Time   NA 141 11/22/2021 0502   NA 138 11/03/2021 1126   K 4.0 11/22/2021 0502   CL 107 11/22/2021 0502   CO2 25 11/22/2021 0502   GLUCOSE 113 (H) 11/22/2021 0502   BUN 20 11/22/2021 0502   BUN 20 11/03/2021 1126   CREATININE 0.80 11/03/2022 1144   CREATININE 0.81 01/14/2015 1557   CALCIUM 8.9 11/22/2021 0502   PROT 6.6 11/22/2021 0502   PROT 7.1 11/03/2021 1126   ALBUMIN 3.5 11/22/2021 0502   ALBUMIN 4.3 11/03/2021 1126   AST 17 11/22/2021 0502   ALT 21 11/22/2021 0502   ALKPHOS 75 11/22/2021 0502   BILITOT 0.6 11/22/2021 0502   BILITOT 0.5 11/03/2021 1126   GFRNONAA >60 11/22/2021 0502   GFRAA >60 10/27/2019 1945   Lab Results  Component Value Date   CHOL 118 11/03/2021   HDL 40 11/03/2021   LDLCALC 60 11/03/2021   TRIG 97 11/03/2021   CHOLHDL 3.0 11/03/2021   Lab Results  Component Value Date   HGBA1C 6.8 (A) 07/12/2022   No results found for: "VITAMINB12" Lab Results  Component Value Date   TSH 2.790 11/03/2021      ASSESSMENT AND PLAN 67 y.o. year old female  has a past medical history of Anxiety, Aortic stenosis, CAD (coronary artery disease), Carotid disease, bilateral (HCC), COPD (chronic obstructive pulmonary disease) (HCC), Essential hypertension, HLD (hyperlipidemia), Morbid obesity (HCC), Myocardial infarction (HCC) (11/17/2014), PONV (postoperative nausea and vomiting), Shingles, Sleep  apnea, and Type 2  diabetes mellitus (HCC). here with:  OSA on BiPAP  BiPAP compliance excellent Residual AHI is good Encouraged patient to continue using BiPAP nightly and > 4 hours each night F/U in 1 year or sooner if needed    Butch Penny, MSN, NP-C 11/26/2022, 9:53 AM [facility] [address], [address] [place], [place] [address] [phone]

## 2022-11-29 MED ORDER — MOMETASONE FURO-FORMOTEROL FUM 100-5 MCG/ACT IN AERO: INHALATION_SPRAY | RESPIRATORY_TRACT | 11 refills | Status: DC

## 2022-11-29 NOTE — Telephone Encounter (Signed)
Ok to refill but make sure she's not also taking breztri or symbicort and strike them from the med list

## 2022-12-01 ENCOUNTER — Telehealth: Payer: Self-pay | Admitting: *Deleted

## 2022-12-01 NOTE — Telephone Encounter (Signed)
[facility] confirmed receipt of order.  

## 2022-12-01 NOTE — Telephone Encounter (Signed)
Order for oxygen concentrator replacement sent to [facility].

## 2022-12-04 ENCOUNTER — Other Ambulatory Visit: Payer: Self-pay | Admitting: Internal Medicine

## 2022-12-05 DIAGNOSIS — G4733 Obstructive sleep apnea (adult) (pediatric): Secondary | ICD-10-CM | POA: Diagnosis not present

## 2022-12-07 ENCOUNTER — Encounter: Payer: BC Managed Care – PPO | Admitting: Thoracic Surgery (Cardiothoracic Vascular Surgery)

## 2022-12-07 NOTE — Telephone Encounter (Signed)
Sue Lush, RN; Kathyrn Sheriff; Santina Evans; Angus Seller, Clovis Riley; 1 other "Pt doesn't have O2 with [place]. I have tried reaching out to see if patient had O2 with [facility] and to let the patient know she would need to be started as a new o2 patient if that was the case. I have not been able to reach the patient. I let referral source know."  I tried to reach the patient. I LVM asking for call back. I also messaged Elease Hashimoto w/ [facility] back to see if she had any further updates as her message above was sent to [place] on 12/03/22.

## 2022-12-08 NOTE — Telephone Encounter (Signed)
I have spoken with the patient and confirmed that she was setup on O2 with [facility]. I sent Elease Hashimoto and the rest of the [facility] team a community message letting them know. Will see if they can take on pt now.

## 2022-12-09 NOTE — Telephone Encounter (Signed)
Spoke with [facility]. He will look into this and call me back.

## 2022-12-09 NOTE — Telephone Encounter (Signed)
Sue Lush, RN; Santina Evans We would actual need a new titrated sleep study done showing that the patient still drops when on pap.

## 2022-12-09 NOTE — Telephone Encounter (Signed)
I have reached out to [facility] w/ [facility]. Waiting to hear back.

## 2022-12-09 NOTE — Telephone Encounter (Signed)
Sue Lush, RN; Santina Evans; Kathyrn Sheriff; Angus Seller, [place]; 1 other Yes, We will be able to service the patient. She would be considered a new start. We would need a new order, Sats and F2F notes.  I am waiting to clarify w/ [facility] if a previous SS will count or if pt needs an ONO ordered.

## 2022-12-13 NOTE — Telephone Encounter (Signed)
Received word from [facility] confirming that yes, pt needs a new titration study in order to get insurance coverage for the O2.

## 2022-12-14 NOTE — Telephone Encounter (Signed)
We could try pulmonary as they may be able to get approved without a titration study

## 2022-12-14 NOTE — Telephone Encounter (Signed)
I called pt and LVM asking for call back.  

## 2022-12-15 NOTE — Telephone Encounter (Signed)
I called pt again and LVM asking for call back.

## 2022-12-20 NOTE — Telephone Encounter (Signed)
As long as she understands the importance. Unless she goes to pulmonology or repeat a study- not sure we can get O2 approved otherwise. Dr. Frances Furbish- just an [facility]

## 2022-12-20 NOTE — Telephone Encounter (Signed)
I would be happy to talk to patient in a FU visit and start the process of getting O2. Another way is to see PCP or she can get in touch with PCP to see a pulmonologist.

## 2022-12-20 NOTE — Telephone Encounter (Signed)
This is a late entry from my phone call w/ patient on 11/18. Pt states she does not wish to repeat a titration study in order to get O2 approved. She said she would just "not worry about it" and I told her it is important for her health to have good O2 levels at night and she said she understood that but she would not repeat sleep study.

## 2023-01-04 DIAGNOSIS — G4733 Obstructive sleep apnea (adult) (pediatric): Secondary | ICD-10-CM | POA: Diagnosis not present

## 2023-01-07 ENCOUNTER — Other Ambulatory Visit: Payer: Self-pay | Admitting: Internal Medicine

## 2023-01-11 ENCOUNTER — Ambulatory Visit (HOSPITAL_COMMUNITY)
Admission: RE | Admit: 2023-01-11 | Discharge: 2023-01-11 | Disposition: A | Discharge status: Home / Self Care | Payer: BC Managed Care – PPO | Source: Ambulatory Visit | Attending: Internal Medicine | Admitting: Internal Medicine

## 2023-01-11 DIAGNOSIS — R911 Solitary pulmonary nodule: Secondary | ICD-10-CM | POA: Diagnosis not present

## 2023-01-11 LAB — PULMONARY FUNCTION TEST
DL/VA % pred: 97 %
DL/VA: 4.09 ml/min/mmHg/L
DLCO unc % pred: 58 %
DLCO unc: 11.33 ml/min/mmHg
FEF 25-75 Post: 1.34 L/s
FEF 25-75 Pre: 0.94 L/s
FEF2575-%Change-Post: 42 %
FEF2575-%Pred-Post: 65 %
FEF2575-%Pred-Pre: 46 %
FEV1-%Change-Post: 7 %
FEV1-%Pred-Post: 55 %
FEV1-%Pred-Pre: 51 %
FEV1-Post: 1.32 L
FEV1-Pre: 1.22 L
FEV1FVC-%Change-Post: 6 %
FEV1FVC-%Pred-Pre: 99 %
FEV6-%Change-Post: 0 %
FEV6-%Pred-Post: 54 %
FEV6-%Pred-Pre: 54 %
FEV6-Post: 1.62 L
FEV6-Pre: 1.61 L
FEV6FVC-%Pred-Post: 104 %
FEV6FVC-%Pred-Pre: 104 %
FVC-%Change-Post: 0 %
FVC-%Pred-Post: 52 %
FVC-%Pred-Pre: 51 %
FVC-Post: 1.62 L
FVC-Pre: 1.61 L
Post FEV1/FVC ratio: 81 %
Post FEV6/FVC ratio: 100 %
Pre FEV1/FVC ratio: 76 %
Pre FEV6/FVC Ratio: 100 %
RV % pred: 114 %
RV: 2.43 L
TLC % pred: 81 %
TLC: 4.13 L

## 2023-01-11 MED ORDER — ALBUTEROL SULFATE (2.5 MG/3ML) 0.083% IN NEBU
2.5000 mg | INHALATION_SOLUTION | Freq: Once | RESPIRATORY_TRACT | Status: AC
  Administered 2023-01-11: 2.5 mg via RESPIRATORY_TRACT

## 2023-01-13 ENCOUNTER — Other Ambulatory Visit: Payer: Self-pay | Admitting: Internal Medicine

## 2023-01-13 DIAGNOSIS — J449 Chronic obstructive pulmonary disease, unspecified: Secondary | ICD-10-CM

## 2023-01-14 ENCOUNTER — Ambulatory Visit (INDEPENDENT_AMBULATORY_CARE_PROVIDER_SITE_OTHER): Payer: BC Managed Care – PPO | Admitting: Internal Medicine

## 2023-01-14 ENCOUNTER — Encounter: Payer: Self-pay | Admitting: Internal Medicine

## 2023-01-14 VITALS — BP 112/56 | HR 77 | Ht 64.0 in | Wt 193.0 lb

## 2023-01-14 DIAGNOSIS — Z7984 Long term (current) use of oral hypoglycemic drugs: Secondary | ICD-10-CM

## 2023-01-14 DIAGNOSIS — Z0001 Encounter for general adult medical examination with abnormal findings: Secondary | ICD-10-CM | POA: Diagnosis not present

## 2023-01-14 DIAGNOSIS — F17210 Nicotine dependence, cigarettes, uncomplicated: Secondary | ICD-10-CM

## 2023-01-14 DIAGNOSIS — I10 Essential (primary) hypertension: Secondary | ICD-10-CM

## 2023-01-14 DIAGNOSIS — E782 Mixed hyperlipidemia: Secondary | ICD-10-CM | POA: Diagnosis not present

## 2023-01-14 DIAGNOSIS — F419 Anxiety disorder, unspecified: Secondary | ICD-10-CM

## 2023-01-14 DIAGNOSIS — E1159 Type 2 diabetes mellitus with other circulatory complications: Secondary | ICD-10-CM | POA: Diagnosis not present

## 2023-01-14 MED ORDER — TIRZEPATIDE 12.5 MG/0.5ML ~~LOC~~ SOAJ
12.5000 mg | SUBCUTANEOUS | 0 refills | Status: DC
Start: 2023-01-14 — End: 2023-04-18

## 2023-01-14 NOTE — Assessment & Plan Note (Addendum)
A1c 6.8 on labs from June.  She is currently prescribed Mounjaro 10 mg weekly, metformin 1000 mg twice daily, and Farxiga 10 mg daily. -Repeat A1c ordered today -Diabetic foot exam completed today -Through shared decision making, Greggory Keen has been increased to 12.5 mg weekly.

## 2023-01-14 NOTE — Patient Instructions (Signed)
It was a pleasure to see you today.  Thank you for giving [place] the opportunity to be involved in your care.  Below is a brief recap of your visit and next steps.  We will plan to see you again in 6 months.  Summary Annual physical completed today Increase Mounjaro to 12.5 mg weekly Repeat labs ordered Follow up in 6 months

## 2023-01-14 NOTE — Assessment & Plan Note (Signed)
Lipid panel updated in October 2023.  Total cholesterol 118 and LDL 60.  She is currently prescribed atorvastatin 40 mg every other day.  Repeat lipid panel ordered today.

## 2023-01-14 NOTE — Progress Notes (Signed)
Complete physical exam  Patient: [name]   DOB: [birth year]   67 y.o. Female  MRN: [id number]  Subjective:    Chief Complaint  Patient presents with   Annual Exam    [name] is a 67 y.o. female who presents today for a complete physical exam. She reports consuming a general diet. Home exercise routine includes walking 1 hrs per day. She generally feels well. She reports sleeping well. She does not have additional problems to discuss today.    Most recent fall risk assessment:    10/18/2022    1:50 PM  Fall Risk   Falls in the past year? 0  Number falls in past yr: 0  Injury with Fall? 0     Most recent depression screenings:    07/12/2022    3:21 PM 03/17/2022    3:09 PM  PHQ 2/9 Scores  PHQ - 2 Score 0 0  PHQ- 9 Score 0 0    Vision:Within last year and Dental: No current dental problems and Receives regular dental care  Past Medical History:  Diagnosis Date   Anxiety    Aortic stenosis    CAD (coronary artery disease)    a. 10/2014: Ant STEMI s/p DES to dLAD   Carotid disease, bilateral (HCC)    a. Duplex 12/2014: mild-mod atherosclerotic plaque without hemodynamically significant stenosis.   COPD (chronic obstructive pulmonary disease) (HCC)    Essential hypertension    HLD (hyperlipidemia)    Morbid obesity (HCC)    Myocardial infarction (HCC) 11/17/2014   PONV (postoperative nausea and vomiting)    Shingles    Sleep apnea    Uses CPAP   Type 2 diabetes mellitus [facility])    Past Surgical History:  Procedure Laterality Date   BREAST BIOPSY Left    2016   CARDIAC CATHETERIZATION N/A 11/17/2014   Procedure: Left Heart Cath and Coronary Angiography;  Surgeon: Iran Ouch, MD;  Location: [facility] IN[facility] CV LAB;  Service: Cardiovascular;  Laterality: N/A;   CARDIAC CATHETERIZATION N/A 11/17/2014   Procedure: Coronary Stent Intervention;  Surgeon: Iran Ouch, MD;  Location: [facility] IN[facility] CV [facility];  Service: Cardiovascular;  Laterality: N/A;   distal lad 2.25x20 promus   CATARACT EXTRACTION W/PHACO Left 09/20/2016   Procedure: CATARACT EXTRACTION PHACO AND INTRAOCULAR LENS PLACEMENT (IOC);  Surgeon: Gemma Payor, MD;  Location: [facility] ORS;  Service: Ophthalmology;  Laterality: Left;  CDE: 9.27   CATARACT EXTRACTION W/PHACO Right 10/18/2016   Procedure: CATARACT EXTRACTION PHACO AND INTRAOCULAR LENS PLACEMENT (IOC);  Surgeon: Gemma Payor, MD;  Location: [facility] ORS;  Service: Ophthalmology;  Laterality: Right;  CDE: 6.38   CHOLECYSTECTOMY     COLONOSCOPY WITH PROPOFOL N/A 02/16/2021   Procedure: COLONOSCOPY WITH PROPOFOL;  Surgeon: Lanelle Bal, DO;  Location: [facility] END[facility];  Service: Endoscopy;  Laterality: N/A;  1:30 / ASA 3   POLYPECTOMY  02/16/2021   Procedure: POLYPECTOMY;  Surgeon: Lanelle Bal, DO;  Location: [facility] END[facility];  Service: Endoscopy;;   SPINAL FUSION     cervical; screws and plates.   TOTAL ABDOMINAL HYSTERECTOMY  1980   Social History   Tobacco Use   Smoking status: Every Day    Current packs/day: 0.50    Average packs/day: 0.5 packs/day for 50.0 years (25.0 ttl pk-yrs)    Types: Cigarettes    Start date: 12/26/1972   Smokeless tobacco: Never   Tobacco comments:    3 cigs per day  Vaping Use  Vaping status: Never Used  Substance Use Topics   Alcohol use: No    Alcohol/week: 0.0 standard drinks of alcohol   Drug use: No   Family History  Problem Relation Age of Onset   Heart attack Father    Heart disease Father    Cancer Brother    Allergies  Allergen Reactions   Codeine Nausea And Vomiting   Sulfa Antibiotics Rash   Patient Care Team: Billie Lade, MD as PCP - General (Internal Medicine) Jonelle Sidle, MD as PCP - Cardiology (Cardiology)   Outpatient Medications Prior to Visit  Medication Sig   albuterol (VENTOLIN HFA) 108 (90 Base) MCG/ACT inhaler TAKE 2 PUFFS BY MOUTH EVERY 6 HOURS AS NEEDED FOR WHEEZE OR SHORTNESS OF BREATH   aspirin EC 81 MG tablet Take 81 mg by mouth daily.    atorvastatin (LIPITOR) 40 MG tablet TAKE 1 TABLET (40 MG TOTAL) BY MOUTH EVERY OTHER DAY. TAKE AT DINNER   Continuous Glucose Sensor (FREESTYLE LIBRE 3 SENSOR) MISC 1 each by Does not apply route every 14 (fourteen) days. Place 1 sensor on the skin every 14 days. Use to check glucose continuously   escitalopram (LEXAPRO) 20 MG tablet TAKE 1 TABLET (20 MG TOTAL) BY MOUTH DAILY AT 6 (SIX) AM.   FARXIGA 10 MG TABS tablet TAKE 1 TABLET BY MOUTH EVERY DAY   fluticasone (FLONASE) 50 MCG/ACT nasal spray Place 1 spray into both nostrils daily.   furosemide (LASIX) 20 MG tablet TAKE 1 TABLET BY MOUTH EVERY DAY   ibuprofen (ADVIL) 800 MG tablet Take 1 tablet (800 mg total) by mouth every 8 (eight) hours as needed.   isosorbide mononitrate (IMDUR) 30 MG 24 hr tablet TAKE 1 TABLET BY MOUTH EVERY DAY   lisinopril (ZESTRIL) 2.5 MG tablet Take 1 tablet (2.5 mg total) by mouth daily.   metFORMIN (GLUCOPHAGE) 1000 MG tablet TAKE 1 TABLET BY MOUTH TWICE A DAY   metoprolol succinate (TOPROL-XL) 25 MG 24 hr tablet Take 25 mg by mouth daily.   mometasone-formoterol (DULERA) 100-5 MCG/ACT AERO Take 2 puffs first thing in am and then another 2 puffs about 12 hours later.   nystatin cream (MYCOSTATIN) Apply 1 Application topically 2 (two) times daily.   No facility-administered medications prior to visit.   Review of Systems  Constitutional:  Negative for chills and fever.  HENT:  Negative for sore throat.   Respiratory:  Negative for cough and shortness of breath.   Cardiovascular:  Negative for chest pain, palpitations and leg swelling.  Gastrointestinal:  Negative for abdominal pain, blood in stool, constipation, diarrhea, nausea and vomiting.  Genitourinary:  Negative for dysuria and hematuria.  Musculoskeletal:  Negative for myalgias.  Skin:  Negative for itching and rash.  Neurological:  Negative for dizziness and headaches.  Psychiatric/Behavioral:  Negative for depression and suicidal ideas.        Objective:     BP (!) 112/56   Pulse 77   Ht 5\' 4"  (1.626 m)   Wt 193 lb (87.5 kg)   SpO2 92%   BMI 33.13 kg/m  BP Readings from Last 3 Encounters:  01/14/23 (!) 112/56  11/09/22 135/69  10/18/22 112/70   Physical Exam Vitals reviewed.  Constitutional:      General: She is not in acute distress.    Appearance: Normal appearance. She is obese. She is not toxic-appearing.  HENT:     Head: Normocephalic and atraumatic.     Right Ear: External ear  normal.     Left Ear: External ear normal.     Nose: Nose normal. No congestion or rhinorrhea.     Mouth/Throat:     Mouth: Mucous membranes are moist.     Pharynx: Oropharynx is clear. No oropharyngeal exudate or posterior oropharyngeal erythema.  Eyes:     General: No scleral icterus.    Extraocular Movements: Extraocular movements intact.     Conjunctiva/sclera: Conjunctivae normal.     Pupils: Pupils are equal, round, and reactive to light.  Cardiovascular:     Rate and Rhythm: Normal rate and regular rhythm.     Pulses: Normal pulses.     Heart sounds: Normal heart sounds. No murmur heard.    No friction rub. No gallop.  Pulmonary:     Effort: Pulmonary effort is normal.     Breath sounds: Normal breath sounds. No wheezing, rhonchi or rales.  Abdominal:     General: Abdomen is flat. Bowel sounds are normal. There is no distension.     Palpations: Abdomen is soft.     Tenderness: There is no abdominal tenderness.  Musculoskeletal:        General: No swelling. Normal range of motion.     Cervical back: Normal range of motion.     Right lower leg: No edema.     Left lower leg: No edema.  Lymphadenopathy:     Cervical: No cervical adenopathy.  Skin:    General: Skin is warm and dry.     Capillary Refill: Capillary refill takes less than 2 seconds.     Coloration: Skin is not jaundiced.  Neurological:     General: No focal deficit present.     Mental Status: She is alert and oriented to person, place, and time.   Psychiatric:        Mood and Affect: Mood normal.        Behavior: Behavior normal.   Diabetic foot exam was performed.  Visual Findings: calluses on each heel Posterior tibialis and dorsalis pulse intact bilaterally.  Intact to touch and monofilament testing bilaterally.   Last CBC Lab Results  Component Value Date   WBC 6.3 11/22/2021   HGB 14.8 11/22/2021   HCT 46.4 (H) 11/22/2021   MCV 93.9 11/22/2021   MCH 30.0 11/22/2021   RDW 15.3 11/22/2021   PLT 232 11/22/2021   Last metabolic panel Lab Results  Component Value Date   GLUCOSE 113 (H) 11/22/2021   NA 141 11/22/2021   K 4.0 11/22/2021   CL 107 11/22/2021   CO2 25 11/22/2021   BUN 20 11/22/2021   CREATININE 0.80 11/03/2022   GFRNONAA >60 11/22/2021   CALCIUM 8.9 11/22/2021   PHOS 4.9 (H) 11/22/2021   PROT 6.6 11/22/2021   ALBUMIN 3.5 11/22/2021   LABGLOB 2.8 11/03/2021   AGRATIO 1.5 11/03/2021   BILITOT 0.6 11/22/2021   ALKPHOS 75 11/22/2021   AST 17 11/22/2021   ALT 21 11/22/2021   ANIONGAP 9 11/22/2021   Last lipids Lab Results  Component Value Date   CHOL 118 11/03/2021   HDL 40 11/03/2021   LDLCALC 60 11/03/2021   TRIG 97 11/03/2021   CHOLHDL 3.0 11/03/2021   Last hemoglobin A1c Lab Results  Component Value Date   HGBA1C 6.8 (A) 07/12/2022   Last thyroid functions Lab Results  Component Value Date   TSH 2.790 11/03/2021   Last vitamin D Lab Results  Component Value Date   VD25OH 61.8 11/03/2021  Assessment & Plan:    Routine Health Maintenance and Physical Exam  Immunization History  Administered Date(s) Administered   Fluad Quad(high Dose 65+) 11/02/2021   Influenza Inj Mdck Quad Pf 10/13/2018   Influenza,inj,Quad PF,6+ Mos 12/10/2022   Influenza-Unspecified 10/06/2018, 11/10/2019, 11/28/2020   Pneumococcal Conjugate PCV 7 12/07/2022   Pneumococcal Polysaccharide-23 11/18/2014   Tdap 03/17/2022    Health Maintenance  Topic Date Due   Zoster Vaccines- Shingrix (1 of  2) Never done   Diabetic kidney evaluation - eGFR measurement  11/23/2022   OPHTHALMOLOGY EXAM  01/09/2023   HEMOGLOBIN A1C  01/11/2023   COVID-19 Vaccine (1) 10/04/2023 (Originally 12/26/1960)   Diabetic kidney evaluation - Urine ACR  10/18/2023   Lung Cancer Screening  11/03/2023   Pneumonia Vaccine 44+ Years old (2 of 2 - PCV) 12/07/2023   FOOT EXAM  01/14/2024   MAMMOGRAM  07/27/2024   Colonoscopy  02/17/2031   DTaP/Tdap/Td (2 - Td or Tdap) 03/17/2032   INFLUENZA VACCINE  Completed   DEXA SCAN  Completed   Hepatitis C Screening  Completed   HPV VACCINES  Aged Out    Discussed health benefits of physical activity, and encouraged her to engage in regular exercise appropriate for her age and condition.  Problem List Items Addressed This Visit       Essential hypertension - Primary   Remains adequately controlled on current antihypertensive regimen.  No medication changes are indicated today.      DM type 2 causing vascular disease (HCC)   A1c 6.8 on labs from June.  She is currently prescribed Mounjaro 10 mg weekly, metformin 1000 mg twice daily, and Farxiga 10 mg daily. -Repeat A1c ordered today -Diabetic foot exam completed today -Through shared decision making, Greggory Keen has been increased to 12.5 mg weekly.      Mixed hyperlipidemia   Lipid panel updated in October 2023.  Total cholesterol 118 and LDL 60.  She is currently prescribed atorvastatin 40 mg every other day.  Repeat lipid panel ordered today.      Anxiety   Anxiety remains adequately controlled with Lexapro.  No changes are indicated today.      Encounter for well adult exam with abnormal findings   Annual physical exam completed today.  Previous records and labs reviewed. -Repeat labs ordered today -Preventative care items are largely up-to-date.  She is scheduled for diabetic eye exam next month. -We will tentatively plan for follow-up in 6 months.      Return in about 6 months (around  07/15/2023).  Billie Lade, MD

## 2023-01-14 NOTE — Assessment & Plan Note (Signed)
Anxiety remains adequately controlled with Lexapro.  No changes are indicated today.

## 2023-01-14 NOTE — Assessment & Plan Note (Signed)
Annual physical exam completed today.  Previous records and labs reviewed. -Repeat labs ordered today -Preventative care items are largely up-to-date.  She is scheduled for diabetic eye exam next month. -We will tentatively plan for follow-up in 6 months.

## 2023-01-14 NOTE — Assessment & Plan Note (Signed)
 Remains adequately controlled on current antihypertensive regimen.  No medication changes are indicated today.

## 2023-01-15 LAB — CMP14+EGFR
ALT: 12 [IU]/L (ref 0–32)
AST: 11 [IU]/L (ref 0–40)
Albumin: 4.2 g/dL (ref 3.9–4.9)
Alkaline Phosphatase: 76 [IU]/L (ref 44–121)
BUN/Creatinine Ratio: 19 (ref 12–28)
BUN: 15 mg/dL (ref 8–27)
Bilirubin Total: 0.4 mg/dL (ref 0.0–1.2)
CO2: 24 mmol/L (ref 20–29)
Calcium: 9.5 mg/dL (ref 8.7–10.3)
Chloride: 101 mmol/L (ref 96–106)
Creatinine, Ser: 0.81 mg/dL (ref 0.57–1.00)
Globulin, Total: 3.2 g/dL (ref 1.5–4.5)
Glucose: 96 mg/dL (ref 70–99)
Potassium: 4.9 mmol/L (ref 3.5–5.2)
Sodium: 143 mmol/L (ref 134–144)
Total Protein: 7.4 g/dL (ref 6.0–8.5)
eGFR: 80 mL/min/{1.73_m2} (ref >59)

## 2023-01-15 LAB — CBC WITH DIFFERENTIAL/PLATELET
Basophils Absolute: 0 10*3/uL (ref 0.0–0.2)
Basos: 1 %
EOS (ABSOLUTE): 0.1 10*3/uL (ref 0.0–0.4)
Eos: 1 %
Hematocrit: 49.9 % — ABNORMAL HIGH (ref 34.0–46.6)
Hemoglobin: 16.3 g/dL — ABNORMAL HIGH (ref 11.1–15.9)
Immature Grans (Abs): 0 10*3/uL (ref 0.0–0.1)
Immature Granulocytes: 0 %
Lymphocytes Absolute: 1.8 10*3/uL (ref 0.7–3.1)
Lymphs: 22 %
MCH: 30.2 pg (ref 26.6–33.0)
MCHC: 32.7 g/dL (ref 31.5–35.7)
MCV: 93 fL (ref 79–97)
Monocytes Absolute: 0.6 10*3/uL (ref 0.1–0.9)
Monocytes: 7 %
Neutrophils Absolute: 5.8 10*3/uL (ref 1.4–7.0)
Neutrophils: 69 %
Platelets: 276 10*3/uL (ref 150–450)
RBC: 5.39 x10E6/uL — ABNORMAL HIGH (ref 3.77–5.28)
RDW: 13.1 % (ref 11.7–15.4)
WBC: 8.5 10*3/uL (ref 3.4–10.8)

## 2023-01-15 LAB — LIPID PANEL
Chol/HDL Ratio: 2.5 {ratio} (ref 0.0–4.4)
Cholesterol, Total: 109 mg/dL (ref 100–199)
HDL: 44 mg/dL (ref >39)
LDL Chol Calc (NIH): 41 mg/dL (ref 0–99)
Triglycerides: 142 mg/dL (ref 0–149)
VLDL Cholesterol Cal: 24 mg/dL (ref 5–40)

## 2023-01-15 LAB — TSH+FREE T4
Free T4: 1.22 ng/dL (ref 0.82–1.77)
TSH: 1.9 u[IU]/mL (ref 0.450–4.500)

## 2023-01-15 LAB — B12 AND FOLATE PANEL
Folate: 7.7 ng/mL (ref >3.0)
Vitamin B-12: 396 pg/mL (ref 232–1245)

## 2023-01-15 LAB — HEMOGLOBIN A1C
Est. average glucose Bld gHb Est-mCnc: 151 mg/dL
Hgb A1c MFr Bld: 6.9 % — ABNORMAL HIGH (ref 4.8–5.6)

## 2023-01-15 LAB — VITAMIN D 25 HYDROXY (VIT D DEFICIENCY, FRACTURES): Vit D, 25-Hydroxy: 31.2 ng/mL (ref 30.0–100.0)

## 2023-01-20 ENCOUNTER — Other Ambulatory Visit: Payer: Self-pay | Admitting: Internal Medicine

## 2023-01-23 ENCOUNTER — Other Ambulatory Visit: Payer: Self-pay | Admitting: Cardiology

## 2023-02-03 ENCOUNTER — Ambulatory Visit
Admission: RE | Admit: 2023-02-03 | Discharge: 2023-02-03 | Disposition: A | Discharge status: Home / Self Care | Payer: BC Managed Care – PPO | Source: Ambulatory Visit | Attending: Internal Medicine | Admitting: Internal Medicine

## 2023-02-03 DIAGNOSIS — R911 Solitary pulmonary nodule: Secondary | ICD-10-CM | POA: Diagnosis not present

## 2023-02-03 DIAGNOSIS — I2510 Atherosclerotic heart disease of native coronary artery without angina pectoris: Secondary | ICD-10-CM | POA: Diagnosis not present

## 2023-02-03 DIAGNOSIS — I700 Atherosclerosis of aorta: Secondary | ICD-10-CM | POA: Diagnosis not present

## 2023-02-05 ENCOUNTER — Other Ambulatory Visit: Payer: Self-pay | Admitting: Cardiology

## 2023-02-09 ENCOUNTER — Encounter: Payer: Self-pay | Admitting: Internal Medicine

## 2023-02-09 ENCOUNTER — Ambulatory Visit: Payer: BC Managed Care – PPO | Admitting: Internal Medicine

## 2023-02-09 VITALS — BP 168/75 | HR 87 | Ht 64.0 in | Wt 188.0 lb

## 2023-02-09 DIAGNOSIS — K222 Esophageal obstruction: Secondary | ICD-10-CM | POA: Insufficient documentation

## 2023-02-09 DIAGNOSIS — E559 Vitamin D deficiency, unspecified: Secondary | ICD-10-CM | POA: Insufficient documentation

## 2023-02-09 DIAGNOSIS — R1310 Dysphagia, unspecified: Secondary | ICD-10-CM | POA: Insufficient documentation

## 2023-02-09 DIAGNOSIS — I10 Essential (primary) hypertension: Secondary | ICD-10-CM

## 2023-02-09 DIAGNOSIS — M722 Plantar fascial fibromatosis: Secondary | ICD-10-CM | POA: Insufficient documentation

## 2023-02-09 DIAGNOSIS — M1990 Unspecified osteoarthritis, unspecified site: Secondary | ICD-10-CM | POA: Insufficient documentation

## 2023-02-09 DIAGNOSIS — F17210 Nicotine dependence, cigarettes, uncomplicated: Secondary | ICD-10-CM

## 2023-02-09 DIAGNOSIS — F32A Depression, unspecified: Secondary | ICD-10-CM | POA: Insufficient documentation

## 2023-02-09 DIAGNOSIS — G5600 Carpal tunnel syndrome, unspecified upper limb: Secondary | ICD-10-CM | POA: Insufficient documentation

## 2023-02-09 DIAGNOSIS — R911 Solitary pulmonary nodule: Secondary | ICD-10-CM

## 2023-02-09 DIAGNOSIS — J4489 Other specified chronic obstructive pulmonary disease: Secondary | ICD-10-CM | POA: Diagnosis not present

## 2023-02-09 DIAGNOSIS — N200 Calculus of kidney: Secondary | ICD-10-CM | POA: Insufficient documentation

## 2023-02-09 DIAGNOSIS — L409 Psoriasis, unspecified: Secondary | ICD-10-CM | POA: Insufficient documentation

## 2023-02-09 LAB — HM DIABETES EYE EXAM

## 2023-02-09 MED ORDER — MOMETASONE FURO-FORMOTEROL FUM 100-5 MCG/ACT IN AERO: INHALATION_SPRAY | RESPIRATORY_TRACT | 3 refills | Status: DC

## 2023-02-09 MED ORDER — VALSARTAN 40 MG PO TABS: 40.0000 mg | ORAL_TABLET | Freq: Every day | ORAL | 3 refills | Status: DC

## 2023-02-09 NOTE — Assessment & Plan Note (Signed)
 D/c acei 02/09/2023 due to throat clearing   ACE inhibitors are problematic in  pts with airway complaints because  even experienced pulmonologists can't always distinguish ace effects from copd/asthma.  By themselves they don't actually cause a problem, much like oxygen  can't by itself start a fire, but they certainly serve as a powerful catalyst or enhancer for any "fire"  or inflammatory process in the upper airway, be it caused by an ET  tube or more commonly reflux (especially in the obese or pts with known GERD or who are on biphoshonates).    In the era of ARB near equivalency until we have a better handle on the reversibility of the airway problem, it just makes sense to avoid ACEI  entirely in the short run and then decide later, having established a level of airway control using a reasonable limited regimen, whether to add back ace but even then being very careful to observe the pt for worsening airway control and number of meds used/ needed to control symptoms.    >>>>  try diovan  40 mg daily and self titrate/ f/u cards planned           Each maintenance medication was reviewed in detail including emphasizing most importantly the difference between maintenance and prns and under what circumstances the prns are to be triggered using an action plan format where appropriate.  Total time for H and P, chart review, counseling, reviewing hfa  device(s) and generating customized AVS unique to this office visit / same day charting = 20 min

## 2023-02-09 NOTE — Assessment & Plan Note (Signed)
 Active smoker with 11/03/22 LLL nodule in setting of CAP  > f/u CT   02/03/23   >>>  F/u ct w/a

## 2023-02-09 NOTE — Progress Notes (Signed)
 [name], female    DOB: [birth year]    MRN: [id number]   Brief patient profile:  40  yowf  active smoker  referred to pulmonary clinic in [place]  11/09/2022 by Dr Kermit Ped for new doe Jan 2024 corrected with Jodell Munda then Sept 21st with abrupt chest and sinus congestion > yellow mucus rocepin/ amox then levaquin and prednisone  and completing 11/10/22.     History of Present Illness  11/09/2022  Pulmonary/ 1st office eval/ [name] / [place] [facility]  Chief Complaint  Patient presents with   Consult  Dyspnea:  85% better and still on last doses of abx /pred Cough: still some light green mucus with sense of nasal/ chest congestion on mucinex  dm  Sleep: 30 degrees hob  SABA use: couple times a day at most  02: none  Lung cancer screen: per PCP but would not start until sort out the present problems  Rec Plan A = Automatic = Always=    Breztri  (symbicort  80) Take 2 puffs first thing in am and then another 2 puffs about 12 hours later.   Work on inhaler technique:  >>>  Remember how golfers warm up by taking practice swings - do this with an empty inhaler  Plan B = Backup (to supplement plan A, not to replace it) Only use your albuterol  inhaler as a rescue medication The key is to stop smoking completely before smoking completely stops you! For cough/ congestion > mucinex  or mucinex  dm  up to maximum of  1200 mg every 12 hours as needed  My office will be contacting you by phone for referral for repeat CT 3  Please schedule a follow up visit in 3 months but call sooner if needed  with all medications /inhalers/ solutions in hand   02/09/2023  f/u ov/[place] [facility]/[name] re: AB  maint on dulera   did bring inhalers  Chief Complaint  Patient presents with   Pulmonary Nodule    Dyspnea:  improved  Cough: freq throat clearing assoc with sensation  of pnds  Sleeping: ok s   resp cc  SABA use: none 02: none  Lung cancer screening: pending    No obvious day to day or daytime  variability or assoc excess/ purulent sputum or mucus plugs or hemoptysis or cp or chest tightness, subjective wheeze or overt sinus or hb symptoms.    Also denies any obvious fluctuation of symptoms with weather or environmental changes or other aggravating or alleviating factors except as outlined above   No unusual exposure hx or h/o childhood pna/ asthma or knowledge of premature birth.  Current Allergies, Complete Past Medical History, Past Surgical History, Family History, and Social History were reviewed in [employer] record.  ROS  The following are not active complaints unless bolded Hoarseness, sore throat, dysphagia, dental problems, itching, sneezing,  nasal congestion or discharge of excess mucus or purulent secretions, ear ache,   fever, chills, sweats, unintended wt loss or wt gain, classically pleuritic or exertional cp,  orthopnea pnd or arm/hand swelling  or leg swelling, presyncope, palpitations, abdominal pain, anorexia, nausea, vomiting, diarrhea  or change in bowel habits or change in bladder habits, change in stools or change in urine, dysuria, hematuria,  rash, arthralgias, visual complaints, headache, numbness, weakness or ataxia or problems with walking or coordination,  change in mood or  memory.        Current Meds  Medication Sig   albuterol  (VENTOLIN  HFA) 108 (90 Base) MCG/ACT inhaler  TAKE 2 PUFFS BY MOUTH EVERY 6 HOURS AS NEEDED FOR WHEEZE OR SHORTNESS OF BREATH   aspirin  EC 81 MG tablet Take 81 mg by mouth daily.   atorvastatin  (LIPITOR ) 40 MG tablet TAKE 1 TABLET (40 MG TOTAL) BY MOUTH EVERY OTHER DAY. TAKE AT DINNER   Continuous Glucose Sensor (FREESTYLE LIBRE 3 SENSOR) MISC 1 each by Does not apply route every 14 (fourteen) days. Place 1 sensor on the skin every 14 days. Use to check glucose continuously   escitalopram  (LEXAPRO ) 20 MG tablet TAKE 1 TABLET (20 MG TOTAL) BY MOUTH DAILY AT 6 (SIX) AM.   FARXIGA  10 MG TABS tablet TAKE 1 TABLET  BY MOUTH EVERY DAY   fluticasone  (FLONASE ) 50 MCG/ACT nasal spray Place 1 spray into both nostrils daily.   furosemide  (LASIX ) 20 MG tablet TAKE 1 TABLET BY MOUTH EVERY DAY   ibuprofen  (ADVIL ) 800 MG tablet Take 1 tablet (800 mg total) by mouth every 8 (eight) hours as needed.   isosorbide  mononitrate (IMDUR ) 30 MG 24 hr tablet TAKE 1 TABLET BY MOUTH EVERY DAY   lisinopril  (ZESTRIL ) 2.5 MG tablet Take 1 tablet (2.5 mg total) by mouth daily.   metFORMIN  (GLUCOPHAGE ) 1000 MG tablet TAKE 1 TABLET BY MOUTH TWICE A DAY   metoprolol  succinate (TOPROL -XL) 25 MG 24 hr tablet TAKE 1.5 TABLETS BY MOUTH DAILY   mometasone -formoterol  (DULERA ) 100-5 MCG/ACT AERO Take 2 puffs first thing in am and then another 2 puffs about 12 hours later.   nystatin  cream (MYCOSTATIN ) Apply 1 Application topically 2 (two) times daily.   tirzepatide  (MOUNJARO ) 12.5 MG/0.5ML Pen Inject 12.5 mg into the skin once a week.            Past Medical History:  Diagnosis Date   Anxiety    Aortic stenosis    CAD (coronary artery disease)    a. 10/2014: Ant STEMI s/p DES to dLAD   Carotid disease, bilateral (HCC)    a. Duplex 12/2014: mild-mod atherosclerotic plaque without hemodynamically significant stenosis.   COPD (chronic obstructive pulmonary disease) (HCC)    Essential hypertension    HLD (hyperlipidemia)    Morbid obesity (HCC)    Myocardial infarction (HCC) 11/17/2014   PONV (postoperative nausea and vomiting)    Shingles    Sleep apnea    Uses CPAP   Type 2 diabetes mellitus (HCC)       Objective:    Wt   Wt Readings from Last 3 Encounters:  02/09/23 188 lb (85.3 kg)  01/14/23 193 lb (87.5 kg)  11/09/22 200 lb 6.4 oz (90.9 kg)      Vital signs reviewed  02/09/2023  - Note at rest 02 sats  94% on RA   General appearance:    amb somber wf nad freq throat clearing         HEENT : Oropharynx  clear/ top denture  Nasal turbinates mild non specific edema    NECK :  without  apparent JVD/ palpable  Nodes/TM    LUNGS: no acc muscle use,  Min barrel  contour chest wall with bilateral  slightly decreased bs s audible wheeze and  without cough on insp or exp maneuvers and min  Hyperresonant  to  percussion bilaterally    CV:  RRR  no s3 or murmur or increase in P2, and no edema   ABD:  obese soft and nontender   MS:  Nl gait/ ext warm without deformities Or obvious joint restrictions  calf  tenderness, cyanosis or clubbing     SKIN: warm and dry without lesions    NEURO:  alert, approp, nl sensorium with  no motor or cerebellar deficits apparent.               Assessment

## 2023-02-09 NOTE — Assessment & Plan Note (Signed)
 Active smoker - 11/09/2022  After extensive coaching inhaler device,  effectiveness =    60% short > try off breo due to cough and start symbicort  80 or dulera  100 2bid and return for pfts - PFT's  01/11/23  FEV1 1.32 (55 % ) ratio 0.81  p 7 % improvement from saba p dulera  prior to study with DLCO  11.33 (58%)   and FV curve no obstruction and ERV 33%  at wt 206    >>>  02/09/2023  After extensive coaching inhaler device,  effectiveness =    80% (delayed insp) > continue dulera  100 2bid / add zyrtec  for rhinitis and d/c acei

## 2023-02-09 NOTE — Assessment & Plan Note (Signed)
 Counseled re importance of smoking cessation but did not meet time criteria for separate billing

## 2023-02-09 NOTE — Patient Instructions (Addendum)
 Valsartan  40 mg one daily in place of lisinopril     Try zyrtec  OTC at bedtime   Work on inhaler technique:  relax and gently blow all the way out then take a nice smooth full deep breath back in, triggering the inhaler at same time you start breathing in.  Hold breath in for at least  5 seconds if you can. Blow out dulera   thru nose. Rinse and gargle with water when done.  If mouth or throat bother you at all,  try brushing teeth/gums/tongue with arm and hammer toothpaste/ make a slurry and gargle and spit out.   Please schedule a follow up visit in 6 months but call sooner if needed

## 2023-02-13 ENCOUNTER — Encounter: Payer: Self-pay | Admitting: Internal Medicine

## 2023-03-06 ENCOUNTER — Other Ambulatory Visit: Payer: Self-pay | Admitting: Internal Medicine

## 2023-03-13 ENCOUNTER — Encounter (HOSPITAL_COMMUNITY): Payer: Self-pay | Admitting: Emergency Medicine

## 2023-03-13 ENCOUNTER — Ambulatory Visit
Admission: EM | Admit: 2023-03-13 | Discharge: 2023-03-13 | Disposition: A | Discharge status: Home / Self Care | Payer: BC Managed Care – PPO | Attending: Nurse Practitioner | Admitting: Nurse Practitioner

## 2023-03-13 ENCOUNTER — Emergency Department (HOSPITAL_COMMUNITY): Payer: BC Managed Care – PPO

## 2023-03-13 ENCOUNTER — Observation Stay (HOSPITAL_COMMUNITY)
Admission: EM | Admit: 2023-03-13 | Discharge: 2023-03-14 | Disposition: A | Discharge status: Home / Self Care | Payer: BC Managed Care – PPO | Attending: Internal Medicine | Admitting: Internal Medicine

## 2023-03-13 ENCOUNTER — Other Ambulatory Visit: Payer: Self-pay

## 2023-03-13 DIAGNOSIS — R0603 Acute respiratory distress: Secondary | ICD-10-CM | POA: Diagnosis not present

## 2023-03-13 DIAGNOSIS — J9601 Acute respiratory failure with hypoxia: Secondary | ICD-10-CM | POA: Diagnosis not present

## 2023-03-13 DIAGNOSIS — Z79899 Other long term (current) drug therapy: Secondary | ICD-10-CM | POA: Diagnosis not present

## 2023-03-13 DIAGNOSIS — F17210 Nicotine dependence, cigarettes, uncomplicated: Secondary | ICD-10-CM | POA: Diagnosis not present

## 2023-03-13 DIAGNOSIS — R059 Cough, unspecified: Secondary | ICD-10-CM | POA: Diagnosis not present

## 2023-03-13 DIAGNOSIS — Z6832 Body mass index (BMI) 32.0-32.9, adult: Secondary | ICD-10-CM | POA: Diagnosis not present

## 2023-03-13 DIAGNOSIS — J441 Chronic obstructive pulmonary disease with (acute) exacerbation: Secondary | ICD-10-CM | POA: Insufficient documentation

## 2023-03-13 DIAGNOSIS — E1165 Type 2 diabetes mellitus with hyperglycemia: Secondary | ICD-10-CM | POA: Diagnosis present

## 2023-03-13 DIAGNOSIS — I2510 Atherosclerotic heart disease of native coronary artery without angina pectoris: Secondary | ICD-10-CM | POA: Diagnosis not present

## 2023-03-13 DIAGNOSIS — Z7982 Long term (current) use of aspirin: Secondary | ICD-10-CM | POA: Diagnosis not present

## 2023-03-13 DIAGNOSIS — E669 Obesity, unspecified: Secondary | ICD-10-CM | POA: Diagnosis not present

## 2023-03-13 DIAGNOSIS — R0602 Shortness of breath: Secondary | ICD-10-CM

## 2023-03-13 DIAGNOSIS — E782 Mixed hyperlipidemia: Secondary | ICD-10-CM | POA: Diagnosis present

## 2023-03-13 DIAGNOSIS — K219 Gastro-esophageal reflux disease without esophagitis: Secondary | ICD-10-CM | POA: Insufficient documentation

## 2023-03-13 DIAGNOSIS — I10 Essential (primary) hypertension: Secondary | ICD-10-CM | POA: Diagnosis not present

## 2023-03-13 DIAGNOSIS — J1089 Influenza due to other identified influenza virus with other manifestations: Secondary | ICD-10-CM | POA: Insufficient documentation

## 2023-03-13 DIAGNOSIS — Z7984 Long term (current) use of oral hypoglycemic drugs: Secondary | ICD-10-CM | POA: Insufficient documentation

## 2023-03-13 DIAGNOSIS — J9621 Acute and chronic respiratory failure with hypoxia: Secondary | ICD-10-CM | POA: Diagnosis present

## 2023-03-13 DIAGNOSIS — R0902 Hypoxemia: Secondary | ICD-10-CM | POA: Diagnosis not present

## 2023-03-13 DIAGNOSIS — Z1152 Encounter for screening for COVID-19: Secondary | ICD-10-CM | POA: Insufficient documentation

## 2023-03-13 DIAGNOSIS — J449 Chronic obstructive pulmonary disease, unspecified: Secondary | ICD-10-CM | POA: Diagnosis present

## 2023-03-13 DIAGNOSIS — R062 Wheezing: Secondary | ICD-10-CM | POA: Diagnosis not present

## 2023-03-13 DIAGNOSIS — J101 Influenza due to other identified influenza virus with other respiratory manifestations: Secondary | ICD-10-CM | POA: Diagnosis not present

## 2023-03-13 DIAGNOSIS — Z794 Long term (current) use of insulin: Secondary | ICD-10-CM | POA: Diagnosis not present

## 2023-03-13 DIAGNOSIS — E66811 Obesity, class 1: Secondary | ICD-10-CM | POA: Insufficient documentation

## 2023-03-13 LAB — CBC WITH DIFFERENTIAL/PLATELET
Abs Immature Granulocytes: 0.03 10*3/uL (ref 0.00–0.07)
Basophils Absolute: 0 10*3/uL (ref 0.0–0.1)
Basophils Relative: 0 %
Eosinophils Absolute: 0.1 10*3/uL (ref 0.0–0.5)
Eosinophils Relative: 2 %
HCT: 42.3 % (ref 36.0–46.0)
Hemoglobin: 13.5 g/dL (ref 12.0–15.0)
Immature Granulocytes: 1 %
Lymphocytes Relative: 14 %
Lymphs Abs: 0.9 10*3/uL (ref 0.7–4.0)
MCH: 30.1 pg (ref 26.0–34.0)
MCHC: 31.9 g/dL (ref 30.0–36.0)
MCV: 94.4 fL (ref 80.0–100.0)
Monocytes Absolute: 0.7 10*3/uL (ref 0.1–1.0)
Monocytes Relative: 11 %
Neutro Abs: 4.8 10*3/uL (ref 1.7–7.7)
Neutrophils Relative %: 72 %
Platelets: 187 10*3/uL (ref 150–400)
RBC: 4.48 MIL/uL (ref 3.87–5.11)
RDW: 14.6 % (ref 11.5–15.5)
WBC: 6.7 10*3/uL (ref 4.0–10.5)
nRBC: 0 % (ref 0.0–0.2)

## 2023-03-13 LAB — GLUCOSE, CAPILLARY
Glucose-Capillary: 244 mg/dL — ABNORMAL HIGH (ref 70–99)
Glucose-Capillary: 208 mg/dL — ABNORMAL HIGH (ref 70–99)

## 2023-03-13 LAB — COMPREHENSIVE METABOLIC PANEL
ALT: 41 U/L (ref 0–44)
AST: 26 U/L (ref 15–41)
Albumin: 3.4 g/dL — ABNORMAL LOW (ref 3.5–5.0)
Alkaline Phosphatase: 66 U/L (ref 38–126)
Anion gap: 11 (ref 5–15)
BUN: 11 mg/dL (ref 8–23)
CO2: 21 mmol/L — ABNORMAL LOW (ref 22–32)
Calcium: 8.4 mg/dL — ABNORMAL LOW (ref 8.9–10.3)
Chloride: 102 mmol/L (ref 98–111)
Creatinine, Ser: 0.73 mg/dL (ref 0.44–1.00)
GFR, Estimated: >60 mL/min (ref >60)
Glucose, Bld: 129 mg/dL — ABNORMAL HIGH (ref 70–99)
Potassium: 4.4 mmol/L (ref 3.5–5.1)
Sodium: 134 mmol/L — ABNORMAL LOW (ref 135–145)
Total Bilirubin: 0.9 mg/dL (ref 0.0–1.2)
Total Protein: 7.1 g/dL (ref 6.5–8.1)

## 2023-03-13 LAB — RESP PANEL BY RT-PCR (RSV, FLU A&B, COVID)  RVPGX2
Influenza A by PCR: POSITIVE — AB
Influenza B by PCR: NEGATIVE
Resp Syncytial Virus by PCR: NEGATIVE
SARS Coronavirus 2 by RT PCR: NEGATIVE

## 2023-03-13 LAB — TSH: TSH: 1.805 u[IU]/mL (ref 0.350–4.500)

## 2023-03-13 LAB — POC COVID19/FLU A&B COMBO
Covid Antigen, POC: NEGATIVE
Influenza A Antigen, POC: NEGATIVE
Influenza B Antigen, POC: NEGATIVE

## 2023-03-13 LAB — MAGNESIUM: Magnesium: 1.5 mg/dL — ABNORMAL LOW (ref 1.7–2.4)

## 2023-03-13 MED ORDER — SODIUM CHLORIDE 0.9% FLUSH
3.0000 mL | Freq: Two times a day (BID) | INTRAVENOUS | Status: DC
  Administered 2023-03-13 – 2023-03-14 (×3): 3 mL via INTRAVENOUS

## 2023-03-13 MED ORDER — ALBUTEROL SULFATE (2.5 MG/3ML) 0.083% IN NEBU
2.5000 mg | INHALATION_SOLUTION | Freq: Once | RESPIRATORY_TRACT | Status: AC
  Administered 2023-03-13: 2.5 mg via RESPIRATORY_TRACT

## 2023-03-13 MED ORDER — SODIUM CHLORIDE 0.9 % IV SOLN: 250.0000 mL | INTRAVENOUS | Status: AC | PRN

## 2023-03-13 MED ORDER — ATORVASTATIN CALCIUM 40 MG PO TABS
40.0000 mg | ORAL_TABLET | ORAL | Status: DC
  Administered 2023-03-13: 40 mg via ORAL
  Filled 2023-03-13: qty 1

## 2023-03-13 MED ORDER — ARFORMOTEROL TARTRATE 15 MCG/2ML IN NEBU
15.0000 ug | INHALATION_SOLUTION | Freq: Two times a day (BID) | RESPIRATORY_TRACT | Status: DC
  Administered 2023-03-13 – 2023-03-14 (×2): 15 ug via RESPIRATORY_TRACT
  Filled 2023-03-13 (×2): qty 2

## 2023-03-13 MED ORDER — IPRATROPIUM-ALBUTEROL 0.5-2.5 (3) MG/3ML IN SOLN
3.0000 mL | Freq: Four times a day (QID) | RESPIRATORY_TRACT | Status: DC | PRN
  Administered 2023-03-13 – 2023-03-14 (×3): 3 mL via RESPIRATORY_TRACT
  Filled 2023-03-13 (×3): qty 3

## 2023-03-13 MED ORDER — IRBESARTAN 75 MG PO TABS
37.5000 mg | ORAL_TABLET | Freq: Every day | ORAL | Status: DC
  Administered 2023-03-13 – 2023-03-14 (×2): 37.5 mg via ORAL
  Filled 2023-03-13 (×2): qty 1

## 2023-03-13 MED ORDER — ENOXAPARIN SODIUM 40 MG/0.4ML IJ SOSY
40.0000 mg | PREFILLED_SYRINGE | INTRAMUSCULAR | Status: DC
  Administered 2023-03-13: 40 mg via SUBCUTANEOUS
  Filled 2023-03-13: qty 0.4

## 2023-03-13 MED ORDER — ALBUTEROL SULFATE (2.5 MG/3ML) 0.083% IN NEBU
2.5000 mg | INHALATION_SOLUTION | Freq: Once | RESPIRATORY_TRACT | Status: AC
  Administered 2023-03-13: 2.5 mg via RESPIRATORY_TRACT
  Filled 2023-03-13: qty 3

## 2023-03-13 MED ORDER — FUROSEMIDE 20 MG PO TABS
20.0000 mg | ORAL_TABLET | Freq: Every day | ORAL | Status: DC
  Administered 2023-03-13 – 2023-03-14 (×2): 20 mg via ORAL
  Filled 2023-03-13 (×2): qty 1

## 2023-03-13 MED ORDER — ACETAMINOPHEN 650 MG RE SUPP: 650.0000 mg | Freq: Four times a day (QID) | RECTAL | Status: DC | PRN

## 2023-03-13 MED ORDER — PROCHLORPERAZINE EDISYLATE 10 MG/2ML IJ SOLN
10.0000 mg | Freq: Four times a day (QID) | INTRAMUSCULAR | Status: DC | PRN
Start: 2023-03-13 — End: 2023-03-14

## 2023-03-13 MED ORDER — ESCITALOPRAM OXALATE 10 MG PO TABS
20.0000 mg | ORAL_TABLET | Freq: Every day | ORAL | Status: DC
  Administered 2023-03-13 – 2023-03-14 (×2): 20 mg via ORAL
  Filled 2023-03-13 (×2): qty 2

## 2023-03-13 MED ORDER — METHYLPREDNISOLONE SODIUM SUCC 40 MG IJ SOLR
40.0000 mg | Freq: Two times a day (BID) | INTRAMUSCULAR | Status: DC
  Administered 2023-03-13 – 2023-03-14 (×2): 40 mg via INTRAVENOUS
  Filled 2023-03-13 (×2): qty 1

## 2023-03-13 MED ORDER — SODIUM CHLORIDE 0.9% FLUSH: 3.0000 mL | INTRAVENOUS | Status: DC | PRN

## 2023-03-13 MED ORDER — IPRATROPIUM-ALBUTEROL 0.5-2.5 (3) MG/3ML IN SOLN
3.0000 mL | Freq: Once | RESPIRATORY_TRACT | Status: AC
  Administered 2023-03-13: 3 mL via RESPIRATORY_TRACT

## 2023-03-13 MED ORDER — BUDESONIDE 0.5 MG/2ML IN SUSP
0.5000 mg | Freq: Two times a day (BID) | RESPIRATORY_TRACT | Status: DC
  Administered 2023-03-13 – 2023-03-14 (×2): 0.5 mg via RESPIRATORY_TRACT
  Filled 2023-03-13 (×2): qty 2

## 2023-03-13 MED ORDER — INSULIN ASPART 100 UNIT/ML IJ SOLN
0.0000 [IU] | Freq: Every day | INTRAMUSCULAR | Status: DC
  Administered 2023-03-13: 2 [IU] via SUBCUTANEOUS

## 2023-03-13 MED ORDER — ASPIRIN 81 MG PO TBEC
81.0000 mg | DELAYED_RELEASE_TABLET | Freq: Every day | ORAL | Status: DC
  Administered 2023-03-13 – 2023-03-14 (×2): 81 mg via ORAL
  Filled 2023-03-13 (×2): qty 1

## 2023-03-13 MED ORDER — INSULIN GLARGINE-YFGN 100 UNIT/ML ~~LOC~~ SOLN
12.0000 [IU] | Freq: Every day | SUBCUTANEOUS | Status: DC
  Administered 2023-03-13: 12 [IU] via SUBCUTANEOUS
  Filled 2023-03-13 (×2): qty 0.12

## 2023-03-13 MED ORDER — ACETAMINOPHEN 325 MG PO TABS: 650.0000 mg | ORAL_TABLET | Freq: Four times a day (QID) | ORAL | Status: DC | PRN

## 2023-03-13 MED ORDER — ISOSORBIDE MONONITRATE ER 30 MG PO TB24
30.0000 mg | ORAL_TABLET | Freq: Every day | ORAL | Status: DC
  Administered 2023-03-13 – 2023-03-14 (×2): 30 mg via ORAL
  Filled 2023-03-13 (×2): qty 1

## 2023-03-13 MED ORDER — FLUTICASONE PROPIONATE 50 MCG/ACT NA SUSP: 1.0000 | Freq: Every day | NASAL | Status: DC

## 2023-03-13 MED ORDER — PANTOPRAZOLE SODIUM 40 MG PO TBEC
40.0000 mg | DELAYED_RELEASE_TABLET | Freq: Two times a day (BID) | ORAL | Status: DC
  Administered 2023-03-13 – 2023-03-14 (×3): 40 mg via ORAL
  Filled 2023-03-13 (×3): qty 1

## 2023-03-13 MED ORDER — NICOTINE 14 MG/24HR TD PT24
14.0000 mg | MEDICATED_PATCH | Freq: Every day | TRANSDERMAL | Status: DC
  Filled 2023-03-13: qty 1

## 2023-03-13 MED ORDER — DM-GUAIFENESIN ER 30-600 MG PO TB12
1.0000 | ORAL_TABLET | Freq: Two times a day (BID) | ORAL | Status: DC
  Administered 2023-03-13 – 2023-03-14 (×2): 1 via ORAL
  Filled 2023-03-13 (×2): qty 1

## 2023-03-13 MED ORDER — MAGNESIUM SULFATE 2 GM/50ML IV SOLN
2.0000 g | Freq: Once | INTRAVENOUS | Status: AC
  Administered 2023-03-13: 2 g via INTRAVENOUS
  Filled 2023-03-13: qty 50

## 2023-03-13 MED ORDER — INSULIN ASPART 100 UNIT/ML IJ SOLN
0.0000 [IU] | Freq: Three times a day (TID) | INTRAMUSCULAR | Status: DC
  Administered 2023-03-13: 5 [IU] via SUBCUTANEOUS
  Administered 2023-03-14 (×2): 3 [IU] via SUBCUTANEOUS

## 2023-03-13 MED ORDER — OSELTAMIVIR PHOSPHATE 75 MG PO CAPS
75.0000 mg | ORAL_CAPSULE | Freq: Two times a day (BID) | ORAL | Status: DC
  Administered 2023-03-13 – 2023-03-14 (×3): 75 mg via ORAL
  Filled 2023-03-13 (×3): qty 1

## 2023-03-13 MED ORDER — METOPROLOL SUCCINATE ER 25 MG PO TB24
37.5000 mg | ORAL_TABLET | Freq: Every day | ORAL | Status: DC
  Administered 2023-03-13 – 2023-03-14 (×2): 37.5 mg via ORAL
  Filled 2023-03-13 (×2): qty 2

## 2023-03-13 MED ORDER — IPRATROPIUM-ALBUTEROL 0.5-2.5 (3) MG/3ML IN SOLN
3.0000 mL | Freq: Once | RESPIRATORY_TRACT | Status: AC
  Administered 2023-03-13: 3 mL via RESPIRATORY_TRACT
  Filled 2023-03-13: qty 3

## 2023-03-13 MED ORDER — METHYLPREDNISOLONE SODIUM SUCC 125 MG IJ SOLR
80.0000 mg | Freq: Once | INTRAMUSCULAR | Status: AC
  Administered 2023-03-13: 80 mg via INTRAMUSCULAR

## 2023-03-13 NOTE — Assessment & Plan Note (Signed)
-  Body mass index is 32.17 kg/m.  -Low-calorie diet, portion control and increase physical activity discussed with patient.

## 2023-03-13 NOTE — ED Notes (Signed)
[facility] here for transport. 

## 2023-03-13 NOTE — ED Triage Notes (Signed)
Pt arrives via EMS UC with reports of SOB for 2 days. Pt RA sats 78%, placed on 3L [place]. Hx of COPD and CHF. Pt given solu-medrol and duo ned by UC. Pt recently traveled to [facility] and reports sick contacts. Reports negative covid and flu.

## 2023-03-13 NOTE — Assessment & Plan Note (Signed)
-  No chest pain or palpitations -Continue treatment with beta-blocker, aspirin, ARB, Imdur and statin. -Continue outpatient follow-up with cardiology service.

## 2023-03-13 NOTE — ED Provider Notes (Signed)
[facility]    CSN: [id number] Arrival date & time: 03/13/23  0806      History   Chief Complaint Chief Complaint  Patient presents with   Cough   Nasal Congestion   Chills    HPI [name] is a 68 y.o. female.   The history is provided by the patient.   Patient presents with a 2-day history of cough, wheezing, shortness of breath, chest congestion, nasal congestion, and chills.  Patient denies fever, ear pain, headache, sore throat, chest pain, abdominal pain, nausea, vomiting, diarrhea, or rash.  Patient reports she has COPD and does smoke, states she has not smoked over the past several days.  Per review of chart, patient with history of CAD, COPD, hypertension, heart failure, and history of respiratory failure with hypoxia.  Past Medical History:  Diagnosis Date   Anxiety    Aortic stenosis    CAD (coronary artery disease)    a. 10/2014: Ant STEMI s/p DES to dLAD   Carotid disease, bilateral (HCC)    a. Duplex 12/2014: mild-mod atherosclerotic plaque without hemodynamically significant stenosis.   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   Vitamin D deficiency 02/09/2023   Solitary pulmonary nodule on lung CT 11/09/2022   Asthmatic bronchitis , chronic (HCC) 11/09/2022   URI (upper respiratory infection) 10/18/2022   Need for Tdap vaccination 03/17/2022   Shortness of breath 01/06/2022   Carotid artery stenosis  11/22/2021   Syncope 11/21/2021   Type 2 diabetes mellitus with hyperglycemia (HCC) 11/21/2021   Prolonged QT interval 11/21/2021   Sleep apnea 11/21/2021   H/O total hysterectomy 11/05/2021   Acute diastolic CHF (congestive heart failure) (HCC) 08/27/2018   Acute respiratory failure with hypoxia (HCC) 08/27/2018   Pleural effusion due to CHF (congestive heart failure) (HCC) 08/27/2018   Hypokalemia 08/27/2018   Abdominal wall cellulitis 08/25/2018   Anasarca 08/25/2018   Hypoxia 08/25/2018   Backache 08/25/2018   Anxiety 08/25/2018   Vertigo 09/27/2017   Morbid obesity (HCC) 01/14/2015   CAD (coronary artery disease)    COPD (chronic obstructive pulmonary disease) (HCC)    Essential hypertension    DM type 2 causing vascular disease (HCC)    Cigarette smoker    Mixed hyperlipidemia    Aortic stenosis    ST elevation (STEMI) myocardial infarction involving left anterior descending coronary artery (HCC) 11/17/2014    Past Surgical History:  Procedure Laterality Date   BREAST BIOPSY Left    2016   CARDIAC CATHETERIZATION N/A 11/17/2014   Procedure: Left Heart Cath and Coronary Angiography;  Surgeon: Iran Ouch, MD;  [facility];  Service: Cardiovascular;  Laterality: N/A;   CARDIAC CATHETERIZATION N/A 11/17/2014   Procedure: Coronary Stent Intervention;  Surgeon: Iran Ouch, MD;  [facility];  Service: Cardiovascular;  Laterality: N/A;  distal lad 2.25x20 promus  CATARACT EXTRACTION W/PHACO Left 09/20/2016   Procedure: CATARACT EXTRACTION PHACO AND INTRAOCULAR LENS PLACEMENT (IOC);  Surgeon: Gemma Payor, MD;  Location: [facility];  Service: Ophthalmology;  Laterality: Left;  CDE: 9.27   CATARACT EXTRACTION W/PHACO Right 10/18/2016   Procedure: CATARACT EXTRACTION PHACO AND INTRAOCULAR LENS PLACEMENT (IOC);  Surgeon: Gemma Payor, MD;  Location: [facility];  Service: Ophthalmology;  Laterality: Right;  CDE: 6.38   CHOLECYSTECTOMY      COLONOSCOPY WITH PROPOFOL N/A 02/16/2021   Procedure: COLONOSCOPY WITH PROPOFOL;  Surgeon: Lanelle Bal, DO;  [facility];  Service: Endoscopy;  Laterality: N/A;  1:30 / ASA 3   POLYPECTOMY  02/16/2021   Procedure: POLYPECTOMY;  Surgeon: Lanelle Bal, DO;  [facility]: [facility];  Service: Endoscopy;;   SPINAL FUSION     cervical; screws and plates.   TOTAL ABDOMINAL HYSTERECTOMY  1980    OB History     Gravida  1   Para  1   Term  1   Preterm      AB      Living  1      SAB      IAB      Ectopic      Multiple      Live Births               Home Medications    Prior to Admission medications   Medication Sig Start Date End Date Taking? Authorizing Provider  albuterol (VENTOLIN HFA) 108 (90 Base) MCG/ACT inhaler TAKE 2 PUFFS BY MOUTH EVERY 6 HOURS AS NEEDED FOR WHEEZE OR SHORTNESS OF BREATH 01/14/23  Yes Billie Lade, MD  aspirin EC 81 MG tablet Take 81 mg by mouth daily.   Yes [provider]  atorvastatin (LIPITOR) 40 MG tablet TAKE 1 TABLET (40 MG TOTAL) BY MOUTH EVERY OTHER DAY. TAKE AT [facility] 01/20/23  Yes Billie Lade, MD  escitalopram (LEXAPRO) 20 MG tablet TAKE 1 TABLET (20 MG TOTAL) BY MOUTH DAILY AT 6 (SIX) AM. 11/01/22 04/30/23 Yes Billie Lade, MD  FARXIGA 10 MG TABS tablet TAKE 1 TABLET BY MOUTH EVERY DAY 01/07/23  Yes Billie Lade, MD  furosemide (LASIX) 20 MG tablet TAKE 1 TABLET BY MOUTH EVERY DAY 10/26/22  Yes Billie Lade, MD  metFORMIN (GLUCOPHAGE) 1000 MG tablet TAKE 1 TABLET BY MOUTH TWICE A DAY 03/07/23  Yes Billie Lade, MD  metoprolol succinate (TOPROL-XL) 25 MG 24 hr tablet TAKE 1.5 TABLETS BY MOUTH DAILY 01/24/23  Yes Jonelle Sidle, MD  mometasone-formoterol [facility]) 100-5 MCG/ACT AERO Take 2 puffs first thing in am and then another 2 puffs about 12 hours later. 02/09/23  Yes Nyoka Cowden, MD  tirzepatide [facility]) 12.5 MG/0.5ML Pen Inject 12.5 mg into the skin once a week.  01/14/23  Yes Billie Lade, MD  valsartan (DIOVAN) 40 MG tablet Take 1 tablet (40 mg total) by mouth daily. 02/09/23  Yes Nyoka Cowden, MD  Continuous Glucose Sensor (FREESTYLE LIBRE 3 SENSOR) MISC 1 each by Does not apply route every 14 (fourteen) days. Place 1 sensor on the skin every 14 days. Use to check glucose continuously 08/24/22   Billie Lade, MD  fluticasone [facility]) 50 MCG/ACT nasal spray Place 1 spray into both nostrils daily. 10/24/20   Merrilee Jansky, MD  ibuprofen (ADVIL) 800 MG tablet Take 1 tablet (800 mg total) by mouth every 8 (eight) hours as needed. 07/25/22  Particia Nearing, PA-C  isosorbide mononitrate (IMDUR) 30 MG 24 hr tablet TAKE 1 TABLET BY MOUTH EVERY DAY 02/07/23   Jonelle Sidle, MD  nystatin cream (MYCOSTATIN) Apply 1 Application topically 2 (two) times daily. 10/01/22   Kerri Perches, MD    Family History Family History  Problem Relation Age of Onset   Heart attack Father    Heart disease Father    Cancer Brother     Social History Social History   Tobacco Use   Smoking status: Every Day    Current packs/day: 0.50    Average packs/day: 0.5 packs/day for 50.2 years (25.1 ttl pk-yrs)    Types: Cigarettes    Start date: 12/26/1972   Smokeless tobacco: Never   Tobacco comments:    3 cigs per day  Vaping Use   Vaping status: Never Used  Substance Use Topics   Alcohol use: No    Alcohol/week: 0.0 standard drinks of alcohol   Drug use: No     Allergies   Codeine and Sulfa antibiotics   Review of Systems Review of Systems Per HPI  Physical Exam Triage Vital Signs ED Triage Vitals  Encounter Vitals Group     BP 03/13/23 0842 (!) 119/50     Systolic BP Percentile --      Diastolic BP Percentile --      Pulse Rate 03/13/23 0842 94     Resp 03/13/23 0842 (!) 24     Temp 03/13/23 0842 99.9 F (37.7 C)     Temp Source 03/13/23 0842 Oral     SpO2 03/13/23 0842 (!) 78 %     Weight --      Height --      Head  Circumference --      Peak Flow --      Pain Score 03/13/23 0840 10     Pain Loc --      Pain Education --      Exclude from Growth Chart --    No data found.  Updated Vital Signs BP (!) 119/50 (BP Location: Right Arm)   Pulse 94   Temp 99.9 F (37.7 C) (Oral)   Resp (!) 24   SpO2 (!) 78%   Visual Acuity Right Eye Distance:   Left Eye Distance:   Bilateral Distance:    Right Eye Near:   Left Eye Near:    Bilateral Near:     Physical Exam Vitals and nursing note reviewed.  Constitutional:      General: She is in acute distress (d/t SOB).     Appearance: Normal appearance.     Comments: Patient having difficulty speaking in a complete sentence.  HENT:     Head: Normocephalic.  Eyes:     Extraocular Movements: Extraocular movements intact.     Pupils: Pupils are equal, round, and reactive to light.  Cardiovascular:     Rate and Rhythm: Normal rate and regular rhythm.     Pulses: Normal pulses.     Heart sounds: Normal heart sounds.  Pulmonary:     Effort: Respiratory distress present.     Breath sounds: Decreased air movement present. Examination of the right-upper field reveals wheezing. Examination of the left-upper field reveals wheezing. Decreased breath sounds and wheezing present.  Abdominal:     General: Bowel sounds are normal.     Palpations: Abdomen is soft.  Musculoskeletal:     Cervical back: Normal range of motion.  Skin:  General: Skin is warm and dry.  Neurological:     General: No focal deficit present.     Mental Status: She is alert and oriented to person, place, and time.  Psychiatric:        Mood and Affect: Mood normal.        Behavior: Behavior normal.      UC Treatments / Results  Labs (all labs ordered are listed, but only abnormal results are displayed) Labs Reviewed  POC COVID19/FLU A&B COMBO    EKG   Radiology No results found.  Procedures Procedures (including critical care time)  Medications Ordered in  UC Medications - No data to display  Initial Impression / Assessment and Plan / UC Course  I have reviewed the triage vital signs and the nursing notes.  Pertinent labs & imaging results that were available during my care of the patient were reviewed by me and considered in my medical decision making (see chart for details).  Patient presented with a 2-day history of shortness of breath, wheezing, and cough.  Upon presentation, baseline sats were noted to be in the mid 70s.  Patient with noted difficulty speaking, and labored respirations.  DuoNeb, and Solu-Medrol 80 mg IM administered, sats improved, but decreased to 82%.  Second albuterol nebulizer treatment was administered, sats increased to the low 90s, but decreased.  Trial ambulation was performed, sats decreased down to 87%.  O2 via nasal cannula was placed, sats remained in the high 80s.  PIV access.  Patient's status improved, although room air sats remained in the low 80s.  Given patient's prior history of COPD, respiratory failure with hypoxia, and CHF, recommend patient be sent to the emergency department via EMS for further evaluation.  Patient was advised of same and is in agreement with this plan of care.  EMS onsite for transport.  Patient transported to [facility], ER.   Final Clinical Impressions(s) / UC Diagnoses   Final diagnoses:  None   Discharge Instructions   None    ED Prescriptions   None    PDMP not reviewed this encounter.   Abran Cantor, NP 03/13/23 [phone]

## 2023-03-13 NOTE — Assessment & Plan Note (Signed)
-  Continue home antihypertensive agents -Follow vital sign -Low-sodium diet discussed with patient.

## 2023-03-13 NOTE — Assessment & Plan Note (Signed)
-  Recent A1c 6.9 -Patient using Mounjaro, metformin and Farxiga as an outpatient -Sliding scale insulin and Semglee has been ordered -Follow CBG fluctuation and adjust hypoglycemic regimen as required. -Anticipating elevated blood sugars with the use of steroids.

## 2023-03-13 NOTE — H&P (Signed)
History and Physical    Patient: [name] ZOX:[id number] DOB: [birth year] DOA: 03/13/2023 DOS: the patient was seen and examined on 03/13/2023 PCP: Billie Lade, MD  Patient coming from: Home  Chief Complaint:  Chief Complaint  Patient presents with   Shortness of Breath   HPI: [name] is a 68 y.o. female with medical history significant of hypertension, hyperlipidemia, coronary artery disease status post DES, COPD, tobacco abuse, class I obesity, obstructive sleep apnea (uses CPAP nightly) and type 2 diabetes mellitus; who presented to the hospital secondary to shortness of breath, URI symptoms and general malaise.  Patient reports symptom has been present for the last 2 days and worsening.  On arrival by EMS found to be hypoxic with oxygen saturation in the mid 80s on room air.  Patient reports no hemoptysis, no melena, no hematochezia, no abdominal pain, no focal weaknesses, no dysuria, no nausea or vomiting.  Patient expressed having chills.  No sick contacts.  Workup demonstrating hypoxia, tachypnea and positive respiratory panel for influenza A.  Chest x-ray not demonstrating acute infiltrates.  Nebulizer management and oxygen supplementation provided; TR[facility] contacted to place patient in the hospital for further evaluation and management.  Review of Systems: As mentioned in the history of present illness. All other systems reviewed and are negative. Past Medical History:  Diagnosis Date   Anxiety    Aortic stenosis    CAD (coronary artery disease)    a. 10/2014: Ant STEMI s/p DES to dLAD   Carotid disease, bilateral (HCC)    a. Duplex 12/2014: mild-mod atherosclerotic plaque without hemodynamically significant stenosis.   COPD (chronic obstructive pulmonary disease) (HCC)    Essential hypertension    HLD (hyperlipidemia)    Morbid obesity (HCC)    Myocardial infarction (HCC) 11/17/2014   PONV (postoperative nausea and vomiting)    Shingles    Sleep apnea     Uses CPAP   Type 2 diabetes mellitus [facility])    Past Surgical History:  Procedure Laterality Date   BREAST BIOPSY Left    2016   CARDIAC CATHETERIZATION N/A 11/17/2014   Procedure: Left Heart Cath and Coronary Angiography;  Surgeon: Iran Ouch, MD;  [facility];  Service: Cardiovascular;  Laterality: N/A;   CARDIAC CATHETERIZATION N/A 11/17/2014   Procedure: Coronary Stent Intervention;  Surgeon: Iran Ouch, MD;  [facility];  Service: Cardiovascular;  Laterality: N/A;  distal lad 2.25x20 promus   CATARACT EXTRACTION W/PHACO Left 09/20/2016   Procedure: CATARACT EXTRACTION PHACO AND INTRAOCULAR LENS PLACEMENT (IOC);  Surgeon: Gemma Payor, MD;  Location: [facility];  Service: Ophthalmology;  Laterality: Left;  CDE: 9.27   CATARACT EXTRACTION W/PHACO Right 10/18/2016   Procedure: CATARACT EXTRACTION PHACO AND INTRAOCULAR LENS PLACEMENT (IOC);  Surgeon: Gemma Payor, MD;  Location: [facility];  Service: Ophthalmology;  Laterality: Right;  CDE: 6.38   CHOLECYSTECTOMY     COLONOSCOPY WITH PROPOFOL N/A 02/16/2021   Procedure: COLONOSCOPY WITH PROPOFOL;  Surgeon: Lanelle Bal, DO;  [facility];  Service: Endoscopy;  Laterality: N/A;  1:30 / ASA 3   POLYPECTOMY  02/16/2021   Procedure: POLYPECTOMY;  Surgeon: Lanelle Bal, DO;  [facility];  Service: Endoscopy;;   SPINAL FUSION     cervical; screws and plates.   TOTAL ABDOMINAL HYSTERECTOMY  1980   Social History:  reports that she has been smoking cigarettes. She started smoking about 50 years ago. She has a  25.1 pack-year smoking history. She has never used smokeless tobacco. She reports that she does not drink alcohol and does not use drugs.  Allergies  Allergen Reactions   Codeine Nausea And Vomiting   Sulfa Antibiotics Rash    Family History  Problem Relation Age of Onset   Heart attack Father    Heart disease Father    Cancer Brother     Prior to Admission  medications   Medication Sig Start Date End Date Taking? Authorizing Provider  albuterol (VENTOLIN HFA) 108 (90 Base) MCG/ACT inhaler TAKE 2 PUFFS BY MOUTH EVERY 6 HOURS AS NEEDED FOR WHEEZE OR SHORTNESS OF BREATH 01/14/23   Billie Lade, MD  aspirin EC 81 MG tablet Take 81 mg by mouth daily.    [provider]  atorvastatin (LIPITOR) 40 MG tablet TAKE 1 TABLET (40 MG TOTAL) BY MOUTH EVERY OTHER DAY. TAKE AT [facility] 01/20/23   Billie Lade, MD  Continuous Glucose Sensor (FREESTYLE LIBRE 3 SENSOR) MISC 1 each by Does not apply route every 14 (fourteen) days. Place 1 sensor on the skin every 14 days. Use to check glucose continuously 08/24/22   Billie Lade, MD  escitalopram (LEXAPRO) 20 MG tablet TAKE 1 TABLET (20 MG TOTAL) BY MOUTH DAILY AT 6 (SIX) AM. 11/01/22 04/30/23  Billie Lade, MD  FARXIGA 10 MG TABS tablet TAKE 1 TABLET BY MOUTH EVERY DAY 01/07/23   Billie Lade, MD  fluticasone (FLONASE) 50 MCG/ACT nasal spray Place 1 spray into both nostrils daily. 10/24/20   Merrilee Jansky, MD  furosemide (LASIX) 20 MG tablet TAKE 1 TABLET BY MOUTH EVERY DAY 10/26/22   Billie Lade, MD  ibuprofen (ADVIL) 800 MG tablet Take 1 tablet (800 mg total) by mouth every 8 (eight) hours as needed. 07/25/22   Particia Nearing, PA-C  isosorbide mononitrate (IMDUR) 30 MG 24 hr tablet TAKE 1 TABLET BY MOUTH EVERY DAY 02/07/23   Jonelle Sidle, MD  metFORMIN (GLUCOPHAGE) 1000 MG tablet TAKE 1 TABLET BY MOUTH TWICE A DAY 03/07/23   Billie Lade, MD  metoprolol succinate (TOPROL-XL) 25 MG 24 hr tablet TAKE 1.5 TABLETS BY MOUTH DAILY 01/24/23   Jonelle Sidle, MD  mometasone-formoterol [facility]) 100-5 MCG/ACT AERO Take 2 puffs first thing in am and then another 2 puffs about 12 hours later. 02/09/23   Nyoka Cowden, MD  nystatin cream (MYCOSTATIN) Apply 1 Application topically 2 (two) times daily. 10/01/22   Kerri Perches, MD  tirzepatide [facility]) 12.5 MG/0.5ML Pen Inject  12.5 mg into the skin once a week. 01/14/23   Billie Lade, MD  valsartan (DIOVAN) 40 MG tablet Take 1 tablet (40 mg total) by mouth daily. 02/09/23   Nyoka Cowden, MD    Physical Exam: Vitals:   03/13/23 1300 03/13/23 1400 03/13/23 1420 03/13/23 1457  BP: (!) 145/66 100/74  (!) 147/75  Pulse: 100 91 92 82  Resp: 18 (!) 27 14 18   Temp:    98.6 F (37 C)  TempSrc:    Oral  SpO2: (!) 89% 91% 91% 97%  Weight:      Height:       General exam: Alert, awake, oriented x 3, demonstrating difficulty speaking in full sentences and requiring 3 L nasal cannula supplementation to maintain saturation. Respiratory system: Fair air movement bilaterally, positive rhonchi and expiratory wheezing appreciated on exam. Cardiovascular system:RRR. No murmurs, rubs, gallops.  No JVD. Gastrointestinal system: Abdomen is  obese, nondistended, soft and nontender. No organomegaly or masses felt. Normal bowel sounds heard. Central nervous system: No focal neurological deficits. Extremities: No cyanosis or clubbing. Skin: No petechiae. Psychiatry: Judgement and insight appear normal. Mood & affect appropriate.   Data Reviewed: CBC with differential: White blood cell 6.7, hemoglobin 13.5 and platelet count 187K Pregnancy metabolic panel: Sodium 134, potassium 4.4, chloride 102, bicarb 21, glucose 129, BUN 11, creatinine 0.73, GFR >60, normal LFTs.  Assessment and Plan: Acute respiratory failure with hypoxia (HCC) -Secondary to influenza and COPD exacerbation -Patient has been started on Tamiflu, steroids, bronchodilators, mucolytic's, flutter valve and nebulizer management -Continue supportive care and maintain adequate hydration -Wean off oxygen supplementation as tolerated.   COPD (chronic obstructive pulmonary disease) (HCC) -With acute exacerbation as mentioned above secondary to influenza -Continue treatment with his steroids, bronchodilator management, as needed nebulizer, mucolytic's and flutter  valve. -Smoking cessation counseling provided -Continue patient follow-up with pulmonologist after discharge.  GERD (gastroesophageal reflux disease) -PPI twice a day has been initiated to assist with GI prophylaxis as well -Lifestyle changes discussed with patient.  Obesity, Class I, BMI 30.0-34.9 (see actual BMI) -Body mass index is 32.17 kg/m.  -Low-calorie diet, portion control and increase physical activity discussed with patient.  Cigarette smoker -Cessation counseling provided -Nicotine patch has been ordered.  Type 2 diabetes mellitus with hyperglycemia (HCC) -Recent A1c 6.9 -Patient using Mounjaro, metformin and Farxiga as an outpatient -Sliding scale insulin and Semglee has been ordered -Follow CBG fluctuation and adjust hypoglycemic regimen as required. -Anticipating elevated blood sugars with the use of steroids.  Mixed hyperlipidemia -Continue statin. -Heart healthy diet discussed with patient.  Essential hypertension -Continue home antihypertensive agents -Follow vital sign -Low-sodium diet discussed with patient.  CAD (coronary artery disease) -No chest pain or palpitations -Continue treatment with beta-blocker, aspirin, ARB, Imdur and statin. -Continue outpatient follow-up with cardiology service.    Advance Care Planning:   Code Status: Full Code   Consults: None  Family Communication: No family at bedside.  Severity of Illness: The appropriate patient status for this patient is INPATIENT. Inpatient status is judged to be reasonable and necessary in order to provide the required intensity of service to ensure the patient's safety. The patient's presenting symptoms, physical exam findings, and initial radiographic and laboratory data in the context of their chronic comorbidities is felt to place them at high risk for further clinical deterioration. Furthermore, it is not anticipated that the patient will be medically stable for discharge from the hospital  within 2 midnights of admission.   * I certify that at the point of admission it is my clinical judgment that the patient will require inpatient hospital care spanning beyond 2 midnights from the point of admission due to high intensity of service, high risk for further deterioration and high frequency of surveillance required.*  Author: Vassie Loll, MD 03/13/2023 3:49 PM  For on call review [email or web address].

## 2023-03-13 NOTE — ED Notes (Signed)
Patient is being discharged from the Urgent Care and sent to the Emergency Department via [facility] . Per NP, patient is in need of higher level of care due to hypoxia. Patient is aware and verbalizes understanding of plan of care.  Vitals:   03/13/23 0909 03/13/23 0923  BP:    Pulse:    Resp:    Temp:    SpO2: (!) 88% (!) 87%

## 2023-03-13 NOTE — Discharge Instructions (Addendum)
Patient transferred to the emergency department via EMS.

## 2023-03-13 NOTE — Assessment & Plan Note (Signed)
-  PPI twice a day has been initiated to assist with GI prophylaxis as well -Lifestyle changes discussed with patient.

## 2023-03-13 NOTE — Assessment & Plan Note (Signed)
-  Secondary to influenza and COPD exacerbation -Patient has been started on Tamiflu, steroids, bronchodilators, mucolytic's, flutter valve and nebulizer management -Continue supportive care and maintain adequate hydration -Wean off oxygen supplementation as tolerated.

## 2023-03-13 NOTE — Assessment & Plan Note (Signed)
Continue statin 

## 2023-03-13 NOTE — ED Notes (Addendum)
ED TO INPATIENT HANDOFF REPORT  ED Nurse Name and Phone #: Beverley Fiedler Name/Age/Gender [name] 68 y.o. female Room/Bed: APA18/APA18  Code Status   Code Status: Full Code  Home/SNF/Other Home Patient oriented to: self, place, time, and situation Is this baseline? Yes   Triage Complete: Triage complete  Chief Complaint Acute respiratory failure with hypoxia (HCC) [J96.01]  Triage Note Pt arrives via EMS UC with reports of SOB for 2 days. Pt RA sats 78%, placed on 3L [place]. Hx of COPD and CHF. Pt given solu-medrol and duo ned by UC. Pt recently traveled to [facility] and reports sick contacts. Reports negative covid and flu.    Allergies Allergies  Allergen Reactions   Codeine Nausea And Vomiting   Sulfa Antibiotics Rash    Level of Care/Admitting Diagnosis ED Disposition     ED Disposition  Admit   Condition  --   Comment  Hospital Area: [facility] [[id number]]  Level of Care: Med-Surg [16]  Covid Evaluation: Confirmed COVID Negative  Diagnosis: Acute respiratory failure with hypoxia [facility]) [[id number]]  Admitting Physician: Vassie Loll [[id number]]  Attending Physician: Vassie Loll [[id number]]  Certification:: I certify this patient will need inpatient services for at least 2 midnights  Expected Medical Readiness: 03/15/2023          B Medical/Surgery History Past Medical History:  Diagnosis Date   Anxiety    Aortic stenosis    CAD (coronary artery disease)    a. 10/2014: Ant STEMI s/p DES to dLAD   Carotid disease, bilateral (HCC)    a. Duplex 12/2014: mild-mod atherosclerotic plaque without hemodynamically significant stenosis.   COPD (chronic obstructive pulmonary disease) (HCC)    Essential hypertension    HLD (hyperlipidemia)    Morbid obesity (HCC)    Myocardial infarction (HCC) 11/17/2014   PONV (postoperative nausea and vomiting)    Shingles    Sleep apnea    Uses CPAP   Type 2 diabetes mellitus [facility])    Past Surgical History:   Procedure Laterality Date   BREAST BIOPSY Left    2016   CARDIAC CATHETERIZATION N/A 11/17/2014   Procedure: Left Heart Cath and Coronary Angiography;  Surgeon: Iran Ouch, MD;  Location: [facility] INVASIVE CV LAB;  Service: Cardiovascular;  Laterality: N/A;   CARDIAC CATHETERIZATION N/A 11/17/2014   Procedure: Coronary Stent Intervention;  Surgeon: Iran Ouch, MD;  Location: [facility] INVASIVE CV LAB;  Service: Cardiovascular;  Laterality: N/A;  distal lad 2.25x20 promus   CATARACT EXTRACTION W/PHACO Left 09/20/2016   Procedure: CATARACT EXTRACTION PHACO AND INTRAOCULAR LENS PLACEMENT (IOC);  Surgeon: Gemma Payor, MD;  Location: [facility] ORS;  Service: Ophthalmology;  Laterality: Left;  CDE: 9.27   CATARACT EXTRACTION W/PHACO Right 10/18/2016   Procedure: CATARACT EXTRACTION PHACO AND INTRAOCULAR LENS PLACEMENT (IOC);  Surgeon: Gemma Payor, MD;  Location: [facility] ORS;  Service: Ophthalmology;  Laterality: Right;  CDE: 6.38   CHOLECYSTECTOMY     COLONOSCOPY WITH PROPOFOL N/A 02/16/2021   Procedure: COLONOSCOPY WITH PROPOFOL;  Surgeon: Lanelle Bal, DO;  Location: [facility] ENDO SUITE;  Service: Endoscopy;  Laterality: N/A;  1:30 / ASA 3   POLYPECTOMY  02/16/2021   Procedure: POLYPECTOMY;  Surgeon: Lanelle Bal, DO;  Location: [facility] ENDO SUITE;  Service: Endoscopy;;   SPINAL FUSION     cervical; screws and plates.   TOTAL ABDOMINAL HYSTERECTOMY  1980     A IV Location/Drains/Wounds Patient Lines/Drains/Airways Status     Active Line/Drains/Airways  Name Placement date Placement time Site Days   Peripheral IV 03/13/23 22 G Left;Posterior Forearm 03/13/23  0922  Forearm  less than 1            Intake/Output Last 24 hours  Intake/Output Summary (Last 24 hours) at 03/13/2023 1418 Last data filed at 03/13/2023 1153 Gross per 24 hour  Intake 49 ml  Output --  Net 49 ml    Labs/Imaging Results for orders placed or performed during the hospital encounter of 03/13/23 (from the past 48  hours)  CBC with Differential     Status: None   Collection Time: 03/13/23 10:23 AM  Result Value Ref Range   WBC 6.7 4.0 - 10.5 K/uL   RBC 4.48 3.87 - 5.11 MIL/uL   Hemoglobin 13.5 12.0 - 15.0 g/dL   HCT 16.1 09.6 - 04.5 %   MCV 94.4 80.0 - 100.0 fL   MCH 30.1 26.0 - 34.0 pg   MCHC 31.9 30.0 - 36.0 g/dL   RDW 40.9 81.1 - 91.4 %   Platelets 187 150 - 400 K/uL   nRBC 0.0 0.0 - 0.2 %   Neutrophils Relative % 72 %   Neutro Abs 4.8 1.7 - 7.7 K/uL   Lymphocytes Relative 14 %   Lymphs Abs 0.9 0.7 - 4.0 K/uL   Monocytes Relative 11 %   Monocytes Absolute 0.7 0.1 - 1.0 K/uL   Eosinophils Relative 2 %   Eosinophils Absolute 0.1 0.0 - 0.5 K/uL   Basophils Relative 0 %   Basophils Absolute 0.0 0.0 - 0.1 K/uL   Immature Granulocytes 1 %   Abs Immature Granulocytes 0.03 0.00 - 0.07 K/uL    Comment: Performed at [facility], [address] [place], [place] [address]  Comprehensive metabolic panel     Status: Abnormal   Collection Time: 03/13/23 10:23 AM  Result Value Ref Range   Sodium 134 (L) 135 - 145 mmol/L   Potassium 4.4 3.5 - 5.1 mmol/L   Chloride 102 98 - 111 mmol/L   CO2 21 (L) 22 - 32 mmol/L   Glucose, Bld 129 (H) 70 - 99 mg/dL    Comment: Glucose reference range applies only to samples taken after fasting for at least 8 hours.   BUN 11 8 - 23 mg/dL   Creatinine, Ser 6.21 0.44 - 1.00 mg/dL   Calcium 8.4 (L) 8.9 - 10.3 mg/dL   Total Protein 7.1 6.5 - 8.1 g/dL   Albumin 3.4 (L) 3.5 - 5.0 g/dL   AST 26 15 - 41 U/L   ALT 41 0 - 44 U/L   Alkaline Phosphatase 66 38 - 126 U/L   Total Bilirubin 0.9 0.0 - 1.2 mg/dL   GFR, Estimated >30 >86 mL/min    Comment: (NOTE) Calculated using the CKD-EPI Creatinine Equation (2021)    Anion gap 11 5 - 15    Comment: Performed at [facility], [address], [place], [place] [address]  Resp panel by RT-PCR (RSV, Flu A&B, Covid) Anterior Nasal Swab     Status: Abnormal   Collection Time: 03/13/23 11:52 AM   Specimen: Anterior Nasal Swab   Result Value Ref Range   SARS Coronavirus 2 by RT PCR NEGATIVE NEGATIVE    Comment: (NOTE) SARS-CoV-2 target nucleic acids are NOT DETECTED.  The SARS-CoV-2 RNA is generally detectable in upper respiratory specimens during the acute phase of infection. The lowest concentration of SARS-CoV-2 viral copies this assay can detect is 138 copies/mL. A negative result  does not preclude SARS-Cov-2 infection and should not be used as the sole basis for treatment or other patient management decisions. A negative result may occur with  improper specimen collection/handling, submission of specimen other than nasopharyngeal swab, presence of viral mutation(s) within the areas targeted by this assay, and inadequate number of viral copies(<138 copies/mL). A negative result must be combined with clinical observations, patient history, and epidemiological information. The expected result is Negative.  Fact Sheet for Patients:  BloggerCourse.com  Fact Sheet for Healthcare Providers:  SeriousBroker.it  This test is no t yet approved or cleared by the [place] FDA and  has been authorized for detection and/or diagnosis of SARS-CoV-2 by FDA under an Emergency Use Authorization (EUA). This EUA will remain  in effect (meaning this test can be used) for the duration of the COVID-19 declaration under Section 564(b)(1) of the Act, 21 U.S.C.section 360bbb-3(b)(1), unless the authorization is terminated  or revoked sooner.       Influenza A by PCR POSITIVE (A) NEGATIVE   Influenza B by PCR NEGATIVE NEGATIVE    Comment: (NOTE) The Xpert Xpress SARS-CoV-2/FLU/RSV plus assay is intended as an aid in the diagnosis of influenza from Nasopharyngeal swab specimens and should not be used as a sole basis for treatment. Nasal washings and aspirates are unacceptable for Xpert Xpress SARS-CoV-2/FLU/RSV testing.  Fact Sheet for  Patients: BloggerCourse.com  Fact Sheet for Healthcare Providers: SeriousBroker.it  This test is not yet approved or cleared by the [place] FDA and has been authorized for detection and/or diagnosis of SARS-CoV-2 by FDA under an Emergency Use Authorization (EUA). This EUA will remain in effect (meaning this test can be used) for the duration of the COVID-19 declaration under Section 564(b)(1) of the Act, 21 U.S.C. section 360bbb-3(b)(1), unless the authorization is terminated or revoked.     Resp Syncytial Virus by PCR NEGATIVE NEGATIVE    Comment: (NOTE) Fact Sheet for Patients: BloggerCourse.com  Fact Sheet for Healthcare Providers: SeriousBroker.it  This test is not yet approved or cleared by the [place] FDA and has been authorized for detection and/or diagnosis of SARS-CoV-2 by FDA under an Emergency Use Authorization (EUA). This EUA will remain in effect (meaning this test can be used) for the duration of the COVID-19 declaration under Section 564(b)(1) of the Act, 21 U.S.C. section 360bbb-3(b)(1), unless the authorization is terminated or revoked.  Performed at [facility], [address], [place], [place] [address]    DG Chest Port 1 View Result Date: 03/13/2023 CLINICAL DATA:  Shortness of breath EXAM: PORTABLE CHEST 1 VIEW COMPARISON:  11/21/2021 FINDINGS: Apical lordotic projection. The lungs appear clear. Cardiac and mediastinal contours normal. No blunting of the costophrenic angles. Lower cervical plate and screw fixator. IMPRESSION: 1. No active cardiopulmonary disease is radiographically apparent. 2. Lower cervical plate and screw fixator. Electronically Signed   By: Gaylyn Rong M.D.   On: 03/13/2023 10:48    Pending Labs Unresulted Labs (From admission, onward)     Start     Ordered   03/20/23 0500  Creatinine, serum  (enoxaparin (LOVENOX)     CrCl >/= 30 ml/min)  Weekly,   R     Comments: while on enoxaparin therapy    03/13/23 1414   03/14/23 0500  CBC  Tomorrow morning,   R        03/13/23 1414   03/14/23 0500  Basic metabolic panel  Tomorrow morning,   R        03/13/23  1414   03/13/23 1413  TSH  Once,   R        03/13/23 1414   03/13/23 1413  Magnesium  Once,   R        03/13/23 1414   03/13/23 1412  CBC  (enoxaparin (LOVENOX)    CrCl >/= 30 ml/min)  Once,   R       Comments: Baseline for enoxaparin therapy IF NOT ALREADY DRAWN.  Notify MD if PLT < 100 K.    03/13/23 1414   03/13/23 1412  Creatinine, serum  (enoxaparin (LOVENOX)    CrCl >/= 30 ml/min)  Once,   R       Comments: Baseline for enoxaparin therapy IF NOT ALREADY DRAWN.    03/13/23 1414   03/13/23 1407  HIV Antibody (routine testing w rflx)  (HIV Antibody (Routine testing w reflex) panel)  Once,   R        03/13/23 1414            Vitals/Pain Today's Vitals   03/13/23 0942 03/13/23 0943 03/13/23 0944 03/13/23 1125  BP:  (!) 166/69    Pulse:  96 91   Resp:  20 18   Temp:  99.7 F (37.6 C)    TempSrc:  Oral    SpO2:  (!) 84% 93% 91%  Weight: 85 kg     Height: 5\' 4"  (1.626 m)     PainSc: 0-No pain       Isolation Precautions No active isolations  Medications Medications  enoxaparin (LOVENOX) injection 40 mg (has no administration in time range)  sodium chloride flush (NS) 0.9 % injection 3 mL (has no administration in time range)  sodium chloride flush (NS) 0.9 % injection 3 mL (has no administration in time range)  0.9 %  sodium chloride infusion (has no administration in time range)  acetaminophen (TYLENOL) tablet 650 mg (has no administration in time range)    Or  acetaminophen (TYLENOL) suppository 650 mg (has no administration in time range)  prochlorperazine (COMPAZINE) injection 10 mg (has no administration in time range)  methylPREDNISolone sodium succinate (SOLU-MEDROL) 40 mg/mL injection 40 mg (has no administration in time  range)  budesonide (PULMICORT) nebulizer solution 0.5 mg (has no administration in time range)  arformoterol (BROVANA) nebulizer solution 15 mcg (has no administration in time range)  ipratropium-albuterol (DUONEB) 0.5-2.5 (3) MG/3ML nebulizer solution 3 mL (has no administration in time range)  pantoprazole (PROTONIX) EC tablet 40 mg (has no administration in time range)  oseltamivir (TAMIFLU) capsule 75 mg (has no administration in time range)  dextromethorphan-guaiFENesin (MUCINEX DM) 30-600 MG per 12 hr tablet 1 tablet (has no administration in time range)  insulin glargine-yfgn (SEMGLEE) injection 12 Units (has no administration in time range)  insulin aspart (novoLOG) injection 0-15 Units (has no administration in time range)  insulin aspart (novoLOG) injection 0-5 Units (has no administration in time range)  magnesium sulfate IVPB 2 g 50 mL (0 g Intravenous Stopped 03/13/23 1153)  ipratropium-albuterol (DUONEB) 0.5-2.5 (3) MG/3ML nebulizer solution 3 mL (3 mLs Nebulization Given 03/13/23 1125)  albuterol (PROVENTIL) (2.5 MG/3ML) 0.083% nebulizer solution 2.5 mg (2.5 mg Nebulization Given 03/13/23 1125)    Mobility walks     R Recommendations: See Admitting Provider Note  Report given to: [facility]

## 2023-03-13 NOTE — ED Triage Notes (Signed)
Sx 2 days  Chills Chest-nasal congestion Non productive cough Smoker.  SOB Wheezing

## 2023-03-13 NOTE — ED Provider Notes (Signed)
[place] EMERGENCY DEPARTMENT AT [facility] Provider Note   CSN: [id number] Arrival date & time: 03/13/23  4098     History {Add pertinent medical, surgical, social history, OB history to HPI:1} Chief Complaint  Patient presents with   Shortness of Breath    [name] is a 68 y.o. female.  Patient with a history of COPD.  She also has coronary artery disease and diabetes.  She comes in complaining of shortness of breath.  Patient does not use oxygen at home.  Patient's room air sats when the paramedics arrived was 78   Shortness of Breath      Home Medications Prior to Admission medications   Medication Sig Start Date End Date Taking? Authorizing Provider  albuterol (VENTOLIN HFA) 108 (90 Base) MCG/ACT inhaler TAKE 2 PUFFS BY MOUTH EVERY 6 HOURS AS NEEDED FOR WHEEZE OR SHORTNESS OF BREATH 01/14/23   Billie Lade, MD  aspirin EC 81 MG tablet Take 81 mg by mouth daily.    [provider]  atorvastatin (LIPITOR) 40 MG tablet TAKE 1 TABLET (40 MG TOTAL) BY MOUTH EVERY OTHER DAY. TAKE AT [facility] 01/20/23   Billie Lade, MD  Continuous Glucose Sensor (FREESTYLE LIBRE 3 SENSOR) MISC 1 each by Does not apply route every 14 (fourteen) days. Place 1 sensor on the skin every 14 days. Use to check glucose continuously 08/24/22   Billie Lade, MD  escitalopram (LEXAPRO) 20 MG tablet TAKE 1 TABLET (20 MG TOTAL) BY MOUTH DAILY AT 6 (SIX) AM. 11/01/22 04/30/23  Billie Lade, MD  FARXIGA 10 MG TABS tablet TAKE 1 TABLET BY MOUTH EVERY DAY 01/07/23   Billie Lade, MD  fluticasone (FLONASE) 50 MCG/ACT nasal spray Place 1 spray into both nostrils daily. 10/24/20   Merrilee Jansky, MD  furosemide (LASIX) 20 MG tablet TAKE 1 TABLET BY MOUTH EVERY DAY 10/26/22   Billie Lade, MD  ibuprofen (ADVIL) 800 MG tablet Take 1 tablet (800 mg total) by mouth every 8 (eight) hours as needed. 07/25/22   Particia Nearing, PA-C  isosorbide mononitrate (IMDUR) 30 MG  24 hr tablet TAKE 1 TABLET BY MOUTH EVERY DAY 02/07/23   Jonelle Sidle, MD  metFORMIN (GLUCOPHAGE) 1000 MG tablet TAKE 1 TABLET BY MOUTH TWICE A DAY 03/07/23   Billie Lade, MD  metoprolol succinate (TOPROL-XL) 25 MG 24 hr tablet TAKE 1.5 TABLETS BY MOUTH DAILY 01/24/23   Jonelle Sidle, MD  mometasone-formoterol [facility]) 100-5 MCG/ACT AERO Take 2 puffs first thing in am and then another 2 puffs about 12 hours later. 02/09/23   Nyoka Cowden, MD  nystatin cream (MYCOSTATIN) Apply 1 Application topically 2 (two) times daily. 10/01/22   Kerri Perches, MD  tirzepatide [facility]) 12.5 MG/0.5ML Pen Inject 12.5 mg into the skin once a week. 01/14/23   Billie Lade, MD  valsartan (DIOVAN) 40 MG tablet Take 1 tablet (40 mg total) by mouth daily. 02/09/23   Nyoka Cowden, MD      Allergies    Codeine and Sulfa antibiotics    Review of Systems   Review of Systems  Respiratory:  Positive for shortness of breath.     Physical Exam Updated Vital Signs BP (!) 166/69   Pulse 91   Temp 99.7 F (37.6 C) (Oral)   Resp 18   Ht 5\' 4"  (1.626 m)   Wt 85 kg   SpO2 91%   BMI 32.17  kg/m  Physical Exam  ED Results / Procedures / Treatments   Labs (all labs ordered are listed, but only abnormal results are displayed) Labs Reviewed  RESP PANEL BY RT-PCR (RSV, FLU A&B, COVID)  [id number] - Abnormal; Notable for the following components:      Result Value   Influenza A by PCR POSITIVE (*)    All other components within normal limits  COMPREHENSIVE METABOLIC PANEL - Abnormal; Notable for the following components:   Sodium 134 (*)    CO2 21 (*)    Glucose, Bld 129 (*)    Calcium 8.4 (*)    Albumin 3.4 (*)    All other components within normal limits  CBC WITH DIFFERENTIAL/PLATELET    EKG None  Radiology DG Chest Port 1 View Result Date: 03/13/2023 CLINICAL DATA:  Shortness of breath EXAM: PORTABLE CHEST 1 VIEW COMPARISON:  11/21/2021 FINDINGS: Apical lordotic projection. The  lungs appear clear. Cardiac and mediastinal contours normal. No blunting of the costophrenic angles. Lower cervical plate and screw fixator. IMPRESSION: 1. No active cardiopulmonary disease is radiographically apparent. 2. Lower cervical plate and screw fixator. Electronically Signed   By: Gaylyn Rong M.D.   On: 03/13/2023 10:48    Procedures Procedures  {Document cardiac monitor, telemetry assessment procedure when appropriate:1}  Medications Ordered in ED Medications  magnesium sulfate IVPB 2 g 50 mL (0 g Intravenous Stopped 03/13/23 1153)  ipratropium-albuterol (DUONEB) 0.5-2.5 (3) MG/3ML nebulizer solution 3 mL (3 mLs Nebulization Given 03/13/23 1125)  albuterol (PROVENTIL) (2.5 MG/3ML) 0.083% nebulizer solution 2.5 mg (2.5 mg Nebulization Given 03/13/23 1125)    ED Course/ Medical Decision Making/ A&P   {   Click here for ABCD2, HEART and other calculatorsREFRESH Note before signing :1}                              Medical Decision Making Amount and/or Complexity of Data Reviewed Labs: ordered. Radiology: ordered. ECG/medicine tests: ordered.  Risk Prescription drug management. Decision regarding hospitalization.   Patient with influenza and COPD exacerbation with new oxygen requirement.  She will be admitted to medicine  {Document critical care time when appropriate:1} {Document review of labs and clinical decision tools ie heart score, Chads2Vasc2 etc:1}  {Document your independent review of radiology images, and any outside records:1} {Document your discussion with family members, caretakers, and with consultants:1} {Document social determinants of health affecting pt's care:1} {Document your decision making why or why not admission, treatments were needed:1} Final Clinical Impression(s) / ED Diagnoses Final diagnoses:  Influenza A  COPD exacerbation (HCC)    Rx / DC Orders ED Discharge Orders     None

## 2023-03-13 NOTE — Assessment & Plan Note (Signed)
-  Cessation counseling provided -Nicotine patch has been ordered. 

## 2023-03-13 NOTE — Assessment & Plan Note (Signed)
-  With acute exacerbation as mentioned above secondary to influenza -Continue treatment with his steroids, bronchodilator management, as needed nebulizer, mucolytic's and flutter valve. -Smoking cessation counseling provided -Continue patient follow-up with pulmonologist after discharge.

## 2023-03-14 DIAGNOSIS — E782 Mixed hyperlipidemia: Secondary | ICD-10-CM | POA: Diagnosis not present

## 2023-03-14 DIAGNOSIS — I10 Essential (primary) hypertension: Secondary | ICD-10-CM | POA: Diagnosis not present

## 2023-03-14 DIAGNOSIS — E669 Obesity, unspecified: Secondary | ICD-10-CM | POA: Diagnosis not present

## 2023-03-14 DIAGNOSIS — J1089 Influenza due to other identified influenza virus with other manifestations: Secondary | ICD-10-CM | POA: Diagnosis not present

## 2023-03-14 DIAGNOSIS — F17210 Nicotine dependence, cigarettes, uncomplicated: Secondary | ICD-10-CM | POA: Diagnosis not present

## 2023-03-14 DIAGNOSIS — J449 Chronic obstructive pulmonary disease, unspecified: Secondary | ICD-10-CM | POA: Diagnosis not present

## 2023-03-14 DIAGNOSIS — K219 Gastro-esophageal reflux disease without esophagitis: Secondary | ICD-10-CM | POA: Diagnosis not present

## 2023-03-14 DIAGNOSIS — I2510 Atherosclerotic heart disease of native coronary artery without angina pectoris: Secondary | ICD-10-CM | POA: Diagnosis not present

## 2023-03-14 DIAGNOSIS — J101 Influenza due to other identified influenza virus with other respiratory manifestations: Secondary | ICD-10-CM | POA: Diagnosis not present

## 2023-03-14 DIAGNOSIS — Z7982 Long term (current) use of aspirin: Secondary | ICD-10-CM | POA: Diagnosis not present

## 2023-03-14 DIAGNOSIS — Z79899 Other long term (current) drug therapy: Secondary | ICD-10-CM | POA: Diagnosis not present

## 2023-03-14 DIAGNOSIS — Z6832 Body mass index (BMI) 32.0-32.9, adult: Secondary | ICD-10-CM | POA: Diagnosis not present

## 2023-03-14 DIAGNOSIS — J9601 Acute respiratory failure with hypoxia: Secondary | ICD-10-CM | POA: Diagnosis not present

## 2023-03-14 DIAGNOSIS — E1165 Type 2 diabetes mellitus with hyperglycemia: Secondary | ICD-10-CM | POA: Diagnosis not present

## 2023-03-14 DIAGNOSIS — J441 Chronic obstructive pulmonary disease with (acute) exacerbation: Secondary | ICD-10-CM | POA: Diagnosis not present

## 2023-03-14 DIAGNOSIS — Z7984 Long term (current) use of oral hypoglycemic drugs: Secondary | ICD-10-CM | POA: Diagnosis not present

## 2023-03-14 DIAGNOSIS — Z794 Long term (current) use of insulin: Secondary | ICD-10-CM | POA: Diagnosis not present

## 2023-03-14 DIAGNOSIS — Z1152 Encounter for screening for COVID-19: Secondary | ICD-10-CM | POA: Diagnosis not present

## 2023-03-14 LAB — GLUCOSE, CAPILLARY
Glucose-Capillary: 169 mg/dL — ABNORMAL HIGH (ref 70–99)
Glucose-Capillary: 193 mg/dL — ABNORMAL HIGH (ref 70–99)

## 2023-03-14 LAB — BASIC METABOLIC PANEL
Anion gap: 7 (ref 5–15)
BUN: 18 mg/dL (ref 8–23)
CO2: 23 mmol/L (ref 22–32)
Calcium: 8.4 mg/dL — ABNORMAL LOW (ref 8.9–10.3)
Chloride: 105 mmol/L (ref 98–111)
Creatinine, Ser: 0.61 mg/dL (ref 0.44–1.00)
GFR, Estimated: >60 mL/min (ref >60)
Glucose, Bld: 200 mg/dL — ABNORMAL HIGH (ref 70–99)
Potassium: 4 mmol/L (ref 3.5–5.1)
Sodium: 135 mmol/L (ref 135–145)

## 2023-03-14 LAB — CBC
HCT: 40 % (ref 36.0–46.0)
Hemoglobin: 12.6 g/dL (ref 12.0–15.0)
MCH: 29.4 pg (ref 26.0–34.0)
MCHC: 31.5 g/dL (ref 30.0–36.0)
MCV: 93.2 fL (ref 80.0–100.0)
Platelets: 182 10*3/uL (ref 150–400)
RBC: 4.29 MIL/uL (ref 3.87–5.11)
RDW: 14.5 % (ref 11.5–15.5)
WBC: 4.6 10*3/uL (ref 4.0–10.5)
nRBC: 0 % (ref 0.0–0.2)

## 2023-03-14 LAB — HIV ANTIBODY (ROUTINE TESTING W REFLEX): HIV Screen 4th Generation wRfx: NONREACTIVE

## 2023-03-14 MED ORDER — PREDNISONE 20 MG PO TABS: ORAL_TABLET | ORAL | 0 refills | Status: DC

## 2023-03-14 MED ORDER — PANTOPRAZOLE SODIUM 40 MG PO TBEC: 40.0000 mg | DELAYED_RELEASE_TABLET | Freq: Two times a day (BID) | ORAL | 1 refills | Status: DC

## 2023-03-14 MED ORDER — OSELTAMIVIR PHOSPHATE 75 MG PO CAPS: 75.0000 mg | ORAL_CAPSULE | Freq: Two times a day (BID) | ORAL | 0 refills | Status: AC

## 2023-03-14 MED ORDER — DM-GUAIFENESIN ER 30-600 MG PO TB12: 1.0000 | ORAL_TABLET | Freq: Two times a day (BID) | ORAL | 0 refills | Status: AC

## 2023-03-14 MED ORDER — ORAL CARE MOUTH RINSE: 15.0000 mL | OROMUCOSAL | Status: DC | PRN

## 2023-03-14 MED ORDER — NICOTINE 14 MG/24HR TD PT24: 14.0000 mg | MEDICATED_PATCH | Freq: Every day | TRANSDERMAL | 1 refills | Status: DC

## 2023-03-14 NOTE — Discharge Summary (Signed)
Physician Discharge Summary   Patient: [name] MRN: [id number] DOB: [birth year]  Admit date:     03/13/2023  Discharge date: 03/14/23  Discharge Physician: Vassie Loll   PCP: Billie Lade, MD   Recommendations at discharge:  Repeat basic metabolic panel to follow electrolytes and renal function Reassess oxygen desaturation screening to determine the need for further supplementation on daily basis. Continue assisting with a smoking cessation Make sure patient follow-up with pulmonologist as previously instructed.  Discharge Diagnoses: Principal Problem:   Acute respiratory failure with hypoxia (HCC) Active Problems:   CAD (coronary artery disease)   COPD (chronic obstructive pulmonary disease) (HCC)   Essential hypertension   Cigarette smoker   Mixed hyperlipidemia   Type 2 diabetes mellitus with hyperglycemia (HCC)   GERD (gastroesophageal reflux disease)   Obesity, Class I, BMI 30.0-34.9 (see actual BMI)   Influenza A  Brief Hospital admission narrative: DEYNA CARBON is a 68 y.o. female with medical history significant of hypertension, hyperlipidemia, coronary artery disease status post DES, COPD, tobacco abuse, class I obesity, obstructive sleep apnea (uses CPAP nightly) and type 2 diabetes mellitus; who presented to the hospital secondary to shortness of breath, URI symptoms and general malaise.  Patient reports symptom has been present for the last 2 days and worsening.  On arrival by EMS found to be hypoxic with oxygen saturation in the mid 80s on room air.   Patient reports no hemoptysis, no melena, no hematochezia, no abdominal pain, no focal weaknesses, no dysuria, no nausea or vomiting.  Patient expressed having chills.  No sick contacts.   Workup demonstrating hypoxia, tachypnea and positive respiratory panel for influenza A.  Chest x-ray not demonstrating acute infiltrates.   Nebulizer management and oxygen supplementation provided; TRH contacted to place  patient in the hospital for further evaluation and management.  Assessment and Plan: 1-acute respiratory failure with hypoxia secondary to influenza A infection and COPD exacerbation -Patient with drastic improvement overnight and feeling ready to go home -Oxygen desaturation screening demonstrated the need for 2 L nasal cannula supplementation at time of discharge. -No significant wheezing, able to speak in full sentences and asking to be discharged. -Patient will go home with prescriptions to complete treatment with Tamiflu, steroids tapering, resumption of home maintenance/as needed bronchodilator management and mucolytic's. -Patient has been encouraged to stop smoking and to follow-up with pulmonologist in the outpatient setting.  2-COPD exacerbation -Steroids tapering, mucolytic's, flutter valve, bronchodilator management and outpatient follow-up with pulmonology as recommended.  Please see above for details.  3-GERD -Continue PPI twice a day. -Lifestyle changes discussed with patient.  4-class I obesity -Low-calorie diet, portion control and increase physical activity discussed with patient -Body mass index is 32.17 kg/m.   5-tobacco abuse -Cessation counseling provided -Continue nicotine patch.  6-type 2 diabetes without long-term use of insulin. -Resume home hypoglycemic regimen -Recent A1c 6.9 -Modified carbohydrate diet discussed with patient -Continue follow-up with PCP to further adjust hypoglycemic agents.  7-mixed hyperlipidemia -Continue statin.  8-essential hypertension/coronary artery disease -No chest pain -Resume home antihypertensive agents and instructions to follow heart healthy/low-sodium diet. -Continue patient follow-up with cardiology service. -Continue treatment with beta-blocker, aspirin, ARB, Imdur and statin.  Consultants: None Procedures performed: See below for x-ray reports. Disposition: Home Diet recommendation: Heart healthy/modified  carbohydrates and low calorie diet.  DISCHARGE MEDICATION: Allergies as of 03/14/2023       Reactions   Codeine Nausea And Vomiting   Sulfa Antibiotics Rash  Medication List     STOP taking these medications    ibuprofen 800 MG tablet Commonly known as: ADVIL       TAKE these medications    acetaminophen 325 MG tablet Commonly known as: TYLENOL Take 650 mg by mouth every 6 (six) hours as needed for moderate pain (pain score 4-6).   albuterol 108 (90 Base) MCG/ACT inhaler Commonly known as: VENTOLIN HFA TAKE 2 PUFFS BY MOUTH EVERY 6 HOURS AS NEEDED FOR WHEEZE OR SHORTNESS OF BREATH What changed: See the new instructions.   aspirin EC 81 MG tablet Take 81 mg by mouth daily.   atorvastatin 40 MG tablet Commonly known as: LIPITOR TAKE 1 TABLET (40 MG TOTAL) BY MOUTH EVERY OTHER DAY. TAKE AT DINNER   dextromethorphan-guaiFENesin 30-600 MG 12hr tablet Commonly known as: MUCINEX DM Take 1 tablet by mouth 2 (two) times daily for 10 days.   escitalopram 20 MG tablet Commonly known as: LEXAPRO TAKE 1 TABLET (20 MG TOTAL) BY MOUTH DAILY AT 6 (SIX) AM.   Farxiga 10 MG Tabs tablet Generic drug: dapagliflozin propanediol TAKE 1 TABLET BY MOUTH EVERY DAY   fluticasone 50 MCG/ACT nasal spray Commonly known as: FLONASE Place 1 spray into both nostrils daily.   FreeStyle Libre 3 Sensor Misc 1 each by Does not apply route every 14 (fourteen) days. Place 1 sensor on the skin every 14 days. Use to check glucose continuously   furosemide 20 MG tablet Commonly known as: LASIX TAKE 1 TABLET BY MOUTH EVERY DAY   isosorbide mononitrate 30 MG 24 hr tablet Commonly known as: IMDUR TAKE 1 TABLET BY MOUTH EVERY DAY   metFORMIN 1000 MG tablet Commonly known as: GLUCOPHAGE TAKE 1 TABLET BY MOUTH TWICE A DAY   metoprolol succinate 25 MG 24 hr tablet Commonly known as: TOPROL-XL TAKE 1.5 TABLETS BY MOUTH DAILY   mometasone-formoterol 100-5 MCG/ACT Aero Commonly known  as: DULERA Take 2 puffs first thing in am and then another 2 puffs about 12 hours later.   nicotine 14 mg/24hr patch Commonly known as: NICODERM CQ - dosed in mg/24 hours Place 1 patch (14 mg total) onto the skin daily. Start taking on: March 15, 2023   nystatin cream Commonly known as: MYCOSTATIN Apply 1 Application topically 2 (two) times daily.   oseltamivir 75 MG capsule Commonly known as: TAMIFLU Take 1 capsule (75 mg total) by mouth 2 (two) times daily for 4 days.   pantoprazole 40 MG tablet Commonly known as: PROTONIX Take 1 tablet (40 mg total) by mouth 2 (two) times daily.   predniSONE 20 MG tablet Commonly known as: DELTASONE Take 3 tabs daily X 1 day, then 2 tablets by mouth daily X 2 days; then 1 tab by mouth daily X 3 days; then 1/2 tablet daily X 3 days and stop prednisone   tirzepatide 12.5 MG/0.5ML Pen Commonly known as: MOUNJARO Inject 12.5 mg into the skin once a week.   valsartan 40 MG tablet Commonly known as: DIOVAN Take 1 tablet (40 mg total) by mouth daily.               Durable Medical Equipment  (From admission, onward)           Start     Ordered   03/14/23 1200  For home use only DME oxygen  Once       Question Answer Comment  Length of Need 12 Months   Mode or (Route) Nasal cannula   Liters per  Minute 2   Frequency Continuous (stationary and portable oxygen unit needed)   Oxygen conserving device Yes   Oxygen delivery system Gas      03/14/23 1201            Follow-up Information     Billie Lade, MD. Schedule an appointment as soon as possible for a visit in 10 day(s).   Specialty: Internal Medicine Contact information: [address] [place] [address] [phone]                Discharge Exam: Ceasar Mons Weights   03/13/23 0942  Weight: 85 kg   General exam: Alert, awake, oriented x 3; feeling significantly better and is speaking in full sentences.  2 L nasal cannula in place.  Wants to  go home. Respiratory system: Air movement bilaterally; positive scattered rhonchi.  No using accessory muscles. Cardiovascular system:RRR. No murmurs, rubs, gallops. Gastrointestinal system: Abdomen is obese, nondistended, soft and nontender. No organomegaly or masses felt. Normal bowel sounds heard. Central nervous system: No focal neurological deficits. Extremities: No cyanosis or clubbing.  No edema. Skin: No petechiae. Psychiatry: Judgement and insight appear normal. Mood & affect appropriate.    Condition at discharge: Stable and improved.  The results of significant diagnostics from this hospitalization (including imaging, microbiology, ancillary and laboratory) are listed below for reference.   Imaging Studies: DG Chest Port 1 View Result Date: 03/13/2023 CLINICAL DATA:  Shortness of breath EXAM: PORTABLE CHEST 1 VIEW COMPARISON:  11/21/2021 FINDINGS: Apical lordotic projection. The lungs appear clear. Cardiac and mediastinal contours normal. No blunting of the costophrenic angles. Lower cervical plate and screw fixator. IMPRESSION: 1. No active cardiopulmonary disease is radiographically apparent. 2. Lower cervical plate and screw fixator. Electronically Signed   By: Gaylyn Rong M.D.   On: 03/13/2023 10:48    Microbiology: Results for orders placed or performed during the hospital encounter of 03/13/23  Resp panel by RT-PCR (RSV, Flu A&B, Covid) Anterior Nasal Swab     Status: Abnormal   Collection Time: 03/13/23 11:52 AM   Specimen: Anterior Nasal Swab  Result Value Ref Range Status   SARS Coronavirus 2 by RT PCR NEGATIVE NEGATIVE Final    Comment: (NOTE) SARS-CoV-2 target nucleic acids are NOT DETECTED.  The SARS-CoV-2 RNA is generally detectable in upper respiratory specimens during the acute phase of infection. The lowest concentration of SARS-CoV-2 viral copies this assay can detect is 138 copies/mL. A negative result does not preclude SARS-Cov-2 infection and  should not be used as the sole basis for treatment or other patient management decisions. A negative result may occur with  improper specimen collection/handling, submission of specimen other than nasopharyngeal swab, presence of viral mutation(s) within the areas targeted by this assay, and inadequate number of viral copies(<138 copies/mL). A negative result must be combined with clinical observations, patient history, and epidemiological information. The expected result is Negative.  Fact Sheet for Patients:  BloggerCourse.com  Fact Sheet for Healthcare Providers:  SeriousBroker.it  This test is no t yet approved or cleared by the [place] FDA and  has been authorized for detection and/or diagnosis of SARS-CoV-2 by FDA under an Emergency Use Authorization (EUA). This EUA will remain  in effect (meaning this test can be used) for the duration of the COVID-19 declaration under Section 564(b)(1) of the Act, 21 U.S.C.section 360bbb-3(b)(1), unless the authorization is terminated  or revoked sooner.       Influenza A by PCR POSITIVE (A)  NEGATIVE Final   Influenza B by PCR NEGATIVE NEGATIVE Final    Comment: (NOTE) The Xpert Xpress SARS-CoV-2/FLU/RSV plus assay is intended as an aid in the diagnosis of influenza from Nasopharyngeal swab specimens and should not be used as a sole basis for treatment. Nasal washings and aspirates are unacceptable for Xpert Xpress SARS-CoV-2/FLU/RSV testing.  Fact Sheet for Patients: BloggerCourse.com  Fact Sheet for Healthcare Providers: SeriousBroker.it  This test is not yet approved or cleared by the [place] FDA and has been authorized for detection and/or diagnosis of SARS-CoV-2 by FDA under an Emergency Use Authorization (EUA). This EUA will remain in effect (meaning this test can be used) for the duration of the COVID-19 declaration  under Section 564(b)(1) of the Act, 21 U.S.C. section 360bbb-3(b)(1), unless the authorization is terminated or revoked.     Resp Syncytial Virus by PCR NEGATIVE NEGATIVE Final    Comment: (NOTE) Fact Sheet for Patients: BloggerCourse.com  Fact Sheet for Healthcare Providers: SeriousBroker.it  This test is not yet approved or cleared by the [place] FDA and has been authorized for detection and/or diagnosis of SARS-CoV-2 by FDA under an Emergency Use Authorization (EUA). This EUA will remain in effect (meaning this test can be used) for the duration of the COVID-19 declaration under Section 564(b)(1) of the Act, 21 U.S.C. section 360bbb-3(b)(1), unless the authorization is terminated or revoked.  Performed at [facility], [address] [place], [place] [address]     Labs: CBC: Recent Labs  Lab 03/13/23 1023 03/14/23 0513  WBC 6.7 4.6  NEUTROABS 4.8  --   HGB 13.5 12.6  HCT 42.3 40.0  MCV 94.4 93.2  PLT 187 182   Basic Metabolic Panel: Recent Labs  Lab 03/13/23 1023 03/14/23 0513  NA 134* 135  K 4.4 4.0  CL 102 105  CO2 21* 23  GLUCOSE 129* 200*  BUN 11 18  CREATININE 0.73 0.61  CALCIUM 8.4* 8.4*  MG 1.5*  --    Liver Function Tests: Recent Labs  Lab 03/13/23 1023  AST 26  ALT 41  ALKPHOS 66  BILITOT 0.9  PROT 7.1  ALBUMIN 3.4*   CBG: Recent Labs  Lab 03/13/23 1616 03/13/23 2021 03/14/23 0732 03/14/23 1132  GLUCAP 244* 208* 169* 193*    Discharge time spent: greater than 30 minutes.  Signed: Vassie Loll, MD [facility] 03/14/2023

## 2023-03-14 NOTE — Progress Notes (Addendum)
SATURATION QUALIFICATIONS: (This note is used to comply with regulatory documentation for home oxygen)  Patient Saturations on Room Air at Rest = 88%  Patient Saturations on Room Air while Ambulating = 87%  Patient Saturations on 2 Liters of oxygen while Ambulating = 91%  Please briefly explain why patient needs home oxygen:

## 2023-03-14 NOTE — TOC Transition Note (Signed)
Transition of Care [facility]) - Discharge Note   Patient Details  Name: [name] MRN: [id number] Date of Birth: [birth year]  [facility] CM/SW Contact:  Villa Herb, LCSWA Phone Number: 03/14/2023, 1:10 PM  Clinical Narrative:    CSW updated that pt will need home O2 set up at discharge home. CSW spoke with pt, she is agreeable to CSW setting up O2. CSW spoke to [place] with [employer] who will work on referral and provide tank to pt. TOC signing off.   Final next level of care: Home/Self Care Barriers to Discharge: Barriers Resolved   Patient Goals and CMS Choice Patient states their goals for this hospitalization and ongoing recovery are:: return home CMS Medicare.gov Compare Post Acute Care list provided to:: Patient Choice offered to / list presented to : Patient      Discharge Placement                       Discharge Plan and Services Additional resources added to the After Visit Summary for                  DME Arranged: Oxygen DME Agency: [employer] Date DME Agency Contacted: 03/14/23   Representative spoke with at DME Agency: Ian Malkin            Social Drivers of Health (SDOH) Interventions SDOH Screenings   Food Insecurity: No Food Insecurity (03/13/2023)  Housing: Low Risk  (03/13/2023)  Transportation Needs: No Transportation Needs (03/13/2023)  Utilities: Not At Risk (03/13/2023)  Depression (PHQ2-9): Low Risk  (07/12/2022)  Financial Resource Strain: Low Risk  (01/07/2023)  Physical Activity: Insufficiently Active (01/07/2023)  Social Connections: Moderately Integrated (03/13/2023)  Stress: No Stress Concern Present (01/07/2023)  Tobacco Use: High Risk (03/13/2023)     Readmission Risk Interventions     No data to display

## 2023-03-15 DIAGNOSIS — G4733 Obstructive sleep apnea (adult) (pediatric): Secondary | ICD-10-CM | POA: Diagnosis not present

## 2023-03-15 DIAGNOSIS — J449 Chronic obstructive pulmonary disease, unspecified: Secondary | ICD-10-CM | POA: Diagnosis not present

## 2023-03-16 DIAGNOSIS — J449 Chronic obstructive pulmonary disease, unspecified: Secondary | ICD-10-CM | POA: Diagnosis not present

## 2023-03-16 DIAGNOSIS — J9601 Acute respiratory failure with hypoxia: Secondary | ICD-10-CM | POA: Diagnosis not present

## 2023-03-22 ENCOUNTER — Encounter: Payer: Self-pay | Admitting: Internal Medicine

## 2023-03-22 ENCOUNTER — Ambulatory Visit (INDEPENDENT_AMBULATORY_CARE_PROVIDER_SITE_OTHER): Payer: BC Managed Care – PPO | Admitting: Internal Medicine

## 2023-03-22 VITALS — BP 132/76 | HR 88 | Ht 64.0 in | Wt 194.2 lb

## 2023-03-22 DIAGNOSIS — J9601 Acute respiratory failure with hypoxia: Secondary | ICD-10-CM | POA: Diagnosis not present

## 2023-03-22 DIAGNOSIS — J101 Influenza due to other identified influenza virus with other respiratory manifestations: Secondary | ICD-10-CM | POA: Diagnosis not present

## 2023-03-22 DIAGNOSIS — Z09 Encounter for follow-up examination after completed treatment for conditions other than malignant neoplasm: Secondary | ICD-10-CM | POA: Diagnosis not present

## 2023-03-22 DIAGNOSIS — J441 Chronic obstructive pulmonary disease with (acute) exacerbation: Secondary | ICD-10-CM | POA: Diagnosis not present

## 2023-03-22 MED ORDER — PREDNISONE 20 MG PO TABS: ORAL_TABLET | ORAL | 0 refills | Status: AC

## 2023-03-22 NOTE — Assessment & Plan Note (Signed)
 Due to influenza A related COPD exacerbation Has mild hypoxia upon exertion, needs to use home O2 as needed for dyspnea

## 2023-03-22 NOTE — Assessment & Plan Note (Signed)
 Recent hospitalization for influenza A related acute hypoxic respiratory failure Completed Tamiflu and oral steroids

## 2023-03-22 NOTE — Assessment & Plan Note (Addendum)
 Likely due to underlying viral infection Improved with Tamiflu and oral steroids Chest x-ray reviewed Due to mild wheezing currently, will extend oral steroid taper for 1 week Continue Dulera regularly and use albuterol as needed for dyspnea or wheezing, advised to use albuterol at least 3 times daily for the next 3 days

## 2023-03-22 NOTE — Progress Notes (Signed)
 Established Patient Office Visit  Subjective:  Patient ID: [name], female    DOB: 1955-06-07  Age: 68 y.o. MRN: [id number]  CC:  Chief Complaint  Patient presents with   Follow-up    Hospital f/u , has noticed some sob     HPI ONESTY CLAIR is a 68 y.o. female with past medical history of hypertension, hyperlipidemia, coronary artery disease status post DES, COPD, tobacco abuse, class I obesity, obstructive sleep apnea (uses CPAP nightly) and type 2 diabetes mellitus who presents for follow-up after recent hospitalization from 03/13/23-03/14/23.  She had dyspnea, URI symptoms and general malaise for 2 days prior to admission.  Her respiratory panel was positive for influenza A, was given Tamiflu in the hospital and upon discharge as well.  Due to hypoxia, she was placed on O2 via nasal cannula.  She was also given steroids for COPD exacerbation.  She has noticed improvement in dyspnea, but still has wheezing.  She has completed Tamiflu and oral prednisone.  Denies any fever or chills.  She is using Dulera twice daily regularly and albuterol as needed for dyspnea or wheezing.  She was given home oxygen from hospital for as needed use.  Past Medical History:  Diagnosis Date   Anxiety    Aortic stenosis    CAD (coronary artery disease)    a. 10/2014: Ant STEMI s/p DES to dLAD   Carotid disease, bilateral (HCC)    a. Duplex 12/2014: mild-mod atherosclerotic plaque without hemodynamically significant stenosis.   COPD (chronic obstructive pulmonary disease) (HCC)    Essential hypertension    HLD (hyperlipidemia)    Morbid obesity (HCC)    Myocardial infarction (HCC) 11/17/2014   PONV (postoperative nausea and vomiting)    Shingles    Sleep apnea    Uses CPAP   Type 2 diabetes mellitus [facility])     Past Surgical History:  Procedure Laterality Date   BREAST BIOPSY Left    2016   CARDIAC CATHETERIZATION N/A 11/17/2014   Procedure: Left Heart Cath and Coronary Angiography;   Surgeon: Iran Ouch, MD;  Location: [facility] INVASIVE CV LAB;  Service: Cardiovascular;  Laterality: N/A;   CARDIAC CATHETERIZATION N/A 11/17/2014   Procedure: Coronary Stent Intervention;  Surgeon: Iran Ouch, MD;  Location: [facility] INVASIVE CV LAB;  Service: Cardiovascular;  Laterality: N/A;  distal lad 2.25x20 promus   CATARACT EXTRACTION W/PHACO Left 09/20/2016   Procedure: CATARACT EXTRACTION PHACO AND INTRAOCULAR LENS PLACEMENT (IOC);  Surgeon: Gemma Payor, MD;  Location: [facility] ORS;  Service: Ophthalmology;  Laterality: Left;  CDE: 9.27   CATARACT EXTRACTION W/PHACO Right 10/18/2016   Procedure: CATARACT EXTRACTION PHACO AND INTRAOCULAR LENS PLACEMENT (IOC);  Surgeon: Gemma Payor, MD;  Location: [facility] ORS;  Service: Ophthalmology;  Laterality: Right;  CDE: 6.38   CHOLECYSTECTOMY     COLONOSCOPY WITH PROPOFOL N/A 02/16/2021   Procedure: COLONOSCOPY WITH PROPOFOL;  Surgeon: Lanelle Bal, DO;  Location: [facility] ENDO SUITE;  Service: Endoscopy;  Laterality: N/A;  1:30 / ASA 3   POLYPECTOMY  02/16/2021   Procedure: POLYPECTOMY;  Surgeon: Lanelle Bal, DO;  Location: [facility] ENDO SUITE;  Service: Endoscopy;;   SPINAL FUSION     cervical; screws and plates.   TOTAL ABDOMINAL HYSTERECTOMY  1980    Family History  Problem Relation Age of Onset   Heart attack Father    Heart disease Father    Cancer Brother     Social History   Socioeconomic History  Marital status: Widowed    Spouse name: Not on file   Number of children: Not on file   Years of education: Not on file   Highest education level: Associate degree: occupational, Scientist, product/process development, or vocational program  Occupational History   Occupation: Office work for [employer]  Tobacco Use   Smoking status: Every Day    Current packs/day: 0.50    Average packs/day: 0.5 packs/day for 50.2 years (25.1 ttl pk-yrs)    Types: Cigarettes    Start date: 12/26/1972   Smokeless tobacco: Never   Tobacco comments:    3 cigs per day  Vaping  Use   Vaping status: Never Used  Substance and Sexual Activity   Alcohol use: No    Alcohol/week: 0.0 standard drinks of alcohol   Drug use: No   Sexual activity: Yes    Partners: Male    Birth control/protection: Surgical  Other Topics Concern   Not on file  Social History Narrative   Not on file   Social Drivers of Health   Financial Resource Strain: Low Risk  (01/07/2023)   Overall Financial Resource Strain (CARDIA)    Difficulty of Paying Living Expenses: Not very hard  Food Insecurity: No Food Insecurity (03/13/2023)   Hunger Vital Sign    Worried About Running Out of Food in the Last Year: Never true    Ran Out of Food in the Last Year: Never true  Transportation Needs: No Transportation Needs (03/13/2023)   PRAPARE - Administrator, Civil Service (Medical): No    Lack of Transportation (Non-Medical): No  Physical Activity: Insufficiently Active (01/07/2023)   Exercise Vital Sign    Days of Exercise per Week: 5 days    Minutes of Exercise per Session: 20 min  Stress: No Stress Concern Present (01/07/2023)   [employer] of Occupational Health - Occupational Stress Questionnaire    Feeling of Stress : Only a little  Social Connections: Moderately Integrated (03/13/2023)   Social Connection and Isolation Panel [NHANES]    Frequency of Communication with Friends and Family: More than three times a week    Frequency of Social Gatherings with Friends and Family: Once a week    Attends Religious Services: 1 to 4 times per year    Active Member of [employer] or Organizations: Yes    Attends Banker Meetings: More than 4 times per year    Marital Status: Widowed  Intimate Partner Violence: Not At Risk (03/13/2023)   Humiliation, Afraid, Rape, and Kick questionnaire    Fear of Current or Ex-Partner: No    Emotionally Abused: No    Physically Abused: No    Sexually Abused: No    Outpatient Medications Prior to Visit  Medication Sig Dispense Refill    acetaminophen (TYLENOL) 325 MG tablet Take 650 mg by mouth every 6 (six) hours as needed for moderate pain (pain score 4-6).     albuterol (VENTOLIN HFA) 108 (90 Base) MCG/ACT inhaler TAKE 2 PUFFS BY MOUTH EVERY 6 HOURS AS NEEDED FOR WHEEZE OR SHORTNESS OF BREATH (Patient taking differently: Inhale 2 puffs into the lungs every 6 (six) hours as needed for shortness of breath.) 8.5 each 3   aspirin EC 81 MG tablet Take 81 mg by mouth daily.     atorvastatin (LIPITOR) 40 MG tablet TAKE 1 TABLET (40 MG TOTAL) BY MOUTH EVERY OTHER DAY. TAKE AT DINNER 45 tablet 1   Continuous Glucose Sensor (FREESTYLE LIBRE 3 SENSOR) MISC 1  each by Does not apply route every 14 (fourteen) days. Place 1 sensor on the skin every 14 days. Use to check glucose continuously 6 each 3   dextromethorphan-guaiFENesin (MUCINEX DM) 30-600 MG 12hr tablet Take 1 tablet by mouth 2 (two) times daily for 10 days. 20 tablet 0   escitalopram (LEXAPRO) 20 MG tablet TAKE 1 TABLET (20 MG TOTAL) BY MOUTH DAILY AT 6 (SIX) AM. 90 tablet 1   FARXIGA 10 MG TABS tablet TAKE 1 TABLET BY MOUTH EVERY DAY 90 tablet 0   fluticasone (FLONASE) 50 MCG/ACT nasal spray Place 1 spray into both nostrils daily. 18 g 0   furosemide (LASIX) 20 MG tablet TAKE 1 TABLET BY MOUTH EVERY DAY 90 tablet 3   isosorbide mononitrate (IMDUR) 30 MG 24 hr tablet TAKE 1 TABLET BY MOUTH EVERY DAY 90 tablet 1   metFORMIN (GLUCOPHAGE) 1000 MG tablet TAKE 1 TABLET BY MOUTH TWICE A DAY 180 tablet 0   metoprolol succinate (TOPROL-XL) 25 MG 24 hr tablet TAKE 1.5 TABLETS BY MOUTH DAILY 135 tablet 3   mometasone-formoterol (DULERA) 100-5 MCG/ACT AERO Take 2 puffs first thing in am and then another 2 puffs about 12 hours later. 3 each 3   nicotine (NICODERM CQ - DOSED IN MG/24 HOURS) 14 mg/24hr patch Place 1 patch (14 mg total) onto the skin daily. 28 patch 1   nystatin cream (MYCOSTATIN) Apply 1 Application topically 2 (two) times daily. 30 g 0   pantoprazole (PROTONIX) 40 MG tablet Take  1 tablet (40 mg total) by mouth 2 (two) times daily. 60 tablet 1   tirzepatide (MOUNJARO) 12.5 MG/0.5ML Pen Inject 12.5 mg into the skin once a week. 6 mL 0   valsartan (DIOVAN) 40 MG tablet Take 1 tablet (40 mg total) by mouth daily. 90 tablet 3   predniSONE (DELTASONE) 20 MG tablet Take 3 tabs daily X 1 day, then 2 tablets by mouth daily X 2 days; then 1 tab by mouth daily X 3 days; then 1/2 tablet daily X 3 days and stop prednisone 12 tablet 0   No facility-administered medications prior to visit.    Allergies  Allergen Reactions   Codeine Nausea And Vomiting   Sulfa Antibiotics Rash    ROS Review of Systems  Constitutional:  Positive for fatigue. Negative for chills and fever.  HENT:  Positive for congestion. Negative for sinus pressure, sinus pain and sore throat.   Eyes:  Negative for pain and discharge.  Respiratory:  Positive for cough, shortness of breath and wheezing.   Cardiovascular:  Negative for chest pain and palpitations.  Gastrointestinal:  Negative for abdominal pain, diarrhea, nausea and vomiting.  Endocrine: Negative for polydipsia and polyuria.  Genitourinary:  Negative for dysuria and hematuria.  Musculoskeletal:  Negative for neck pain and neck stiffness.  Skin:  Negative for rash.  Neurological:  Negative for dizziness and weakness.  Psychiatric/Behavioral:  Negative for agitation and behavioral problems.       Objective:    Physical Exam Vitals reviewed.  Constitutional:      General: She is not in acute distress.    Appearance: She is obese. She is not diaphoretic.  HENT:     Head: Normocephalic and atraumatic.     Nose: Congestion present.     Mouth/Throat:     Mouth: Mucous membranes are moist.     Pharynx: No posterior oropharyngeal erythema.  Eyes:     General: No scleral icterus.    Extraocular Movements: Extraocular  movements intact.     Pupils: Pupils are equal, round, and reactive to light.  Cardiovascular:     Rate and Rhythm: Normal  rate and regular rhythm.     Heart sounds: Normal heart sounds. No murmur heard. Pulmonary:     Breath sounds: Wheezing (Mild, diffuse) present. No rales.  Musculoskeletal:     Cervical back: Neck supple. No tenderness.     Right lower leg: No edema.     Left lower leg: No edema.  Skin:    General: Skin is warm.     Findings: No rash.  Neurological:     General: No focal deficit present.     Mental Status: She is alert and oriented to person, place, and time.  Psychiatric:        Mood and Affect: Mood normal.        Behavior: Behavior normal.     BP 132/76 (BP Location: Left Arm)   Pulse 88   Ht 5\' 4"  (1.626 m)   Wt 194 lb 3.2 oz (88.1 kg)   SpO2 (!) 89%   BMI 33.33 kg/m  Wt Readings from Last 3 Encounters:  03/22/23 194 lb 3.2 oz (88.1 kg)  03/13/23 187 lb 6.3 oz (85 kg)  02/09/23 188 lb (85.3 kg)    Lab Results  Component Value Date   TSH 1.805 03/13/2023   Lab Results  Component Value Date   WBC 4.6 03/14/2023   HGB 12.6 03/14/2023   HCT 40.0 03/14/2023   MCV 93.2 03/14/2023   PLT 182 03/14/2023   Lab Results  Component Value Date   NA 135 03/14/2023   K 4.0 03/14/2023   CO2 23 03/14/2023   GLUCOSE 200 (H) 03/14/2023   BUN 18 03/14/2023   CREATININE 0.61 03/14/2023   BILITOT 0.9 03/13/2023   ALKPHOS 66 03/13/2023   AST 26 03/13/2023   ALT 41 03/13/2023   PROT 7.1 03/13/2023   ALBUMIN 3.4 (L) 03/13/2023   CALCIUM 8.4 (L) 03/14/2023   ANIONGAP 7 03/14/2023   EGFR 80 01/14/2023   Lab Results  Component Value Date   CHOL 109 01/14/2023   Lab Results  Component Value Date   HDL 44 01/14/2023   Lab Results  Component Value Date   LDLCALC 41 01/14/2023   Lab Results  Component Value Date   TRIG 142 01/14/2023   Lab Results  Component Value Date   CHOLHDL 2.5 01/14/2023   Lab Results  Component Value Date   HGBA1C 6.9 (H) 01/14/2023      Assessment & Plan:   Problem List Items Addressed This Visit       Respiratory   Acute  respiratory failure with hypoxia (HCC)   Due to influenza A related COPD exacerbation Has mild hypoxia upon exertion, needs to use home O2 as needed for dyspnea      Influenza A - Primary   Recent hospitalization for influenza A related acute hypoxic respiratory failure Completed Tamiflu and oral steroids      COPD exacerbation (HCC)   Likely due to underlying viral infection Improved with Tamiflu and oral steroids Chest x-ray reviewed Due to mild wheezing currently, will extend oral steroid taper for 1 week Continue Dulera regularly and use albuterol as needed for dyspnea or wheezing, advised to use albuterol at least 3 times daily for the next 3 days      Relevant Medications   predniSONE (DELTASONE) 20 MG tablet     Other   Hospital  discharge follow-up   Hospital chart reviewed, including discharge summary Medications reconciled and reviewed with the patient in detail       Meds ordered this encounter  Medications   predniSONE (DELTASONE) 20 MG tablet    Sig: Take 1 tablet (20 mg total) by mouth daily with breakfast for 3 days, THEN 0.5 tablets (10 mg total) daily with breakfast for 4 days.    Dispense:  5 tablet    Refill:  0    Follow-up: Return if symptoms worsen or fail to improve.    Anabel Halon, MD

## 2023-03-22 NOTE — Assessment & Plan Note (Signed)
 Hospital chart reviewed, including discharge summary Medications reconciled and reviewed with the patient in detail

## 2023-03-22 NOTE — Patient Instructions (Signed)
 Please start taking Prednisone 20 mg once daily X 3 days and then 1/2 tablet for 4 days.  Please use Albuterol at least 3 times daily for the next 3 days and as needed for shortness of breath or wheezing.

## 2023-04-12 DIAGNOSIS — J449 Chronic obstructive pulmonary disease, unspecified: Secondary | ICD-10-CM | POA: Diagnosis not present

## 2023-04-12 DIAGNOSIS — G4733 Obstructive sleep apnea (adult) (pediatric): Secondary | ICD-10-CM | POA: Diagnosis not present

## 2023-04-13 DIAGNOSIS — J9601 Acute respiratory failure with hypoxia: Secondary | ICD-10-CM | POA: Diagnosis not present

## 2023-04-13 DIAGNOSIS — J449 Chronic obstructive pulmonary disease, unspecified: Secondary | ICD-10-CM | POA: Diagnosis not present

## 2023-04-16 ENCOUNTER — Other Ambulatory Visit: Payer: Self-pay | Admitting: Internal Medicine

## 2023-04-16 DIAGNOSIS — E1159 Type 2 diabetes mellitus with other circulatory complications: Secondary | ICD-10-CM

## 2023-04-17 ENCOUNTER — Other Ambulatory Visit: Payer: Self-pay | Admitting: Internal Medicine

## 2023-04-20 ENCOUNTER — Ambulatory Visit: Payer: BC Managed Care – PPO | Admitting: Cardiology

## 2023-05-13 DIAGNOSIS — G4733 Obstructive sleep apnea (adult) (pediatric): Secondary | ICD-10-CM | POA: Diagnosis not present

## 2023-05-14 DIAGNOSIS — J449 Chronic obstructive pulmonary disease, unspecified: Secondary | ICD-10-CM | POA: Diagnosis not present

## 2023-05-28 ENCOUNTER — Other Ambulatory Visit: Payer: Self-pay | Admitting: Internal Medicine

## 2023-05-28 DIAGNOSIS — J449 Chronic obstructive pulmonary disease, unspecified: Secondary | ICD-10-CM

## 2023-06-09 ENCOUNTER — Other Ambulatory Visit: Payer: Self-pay | Admitting: Internal Medicine

## 2023-06-12 ENCOUNTER — Other Ambulatory Visit: Payer: Self-pay | Admitting: Internal Medicine

## 2023-06-13 DIAGNOSIS — J9601 Acute respiratory failure with hypoxia: Secondary | ICD-10-CM | POA: Diagnosis not present

## 2023-06-13 DIAGNOSIS — J449 Chronic obstructive pulmonary disease, unspecified: Secondary | ICD-10-CM | POA: Diagnosis not present

## 2023-06-13 DIAGNOSIS — G4733 Obstructive sleep apnea (adult) (pediatric): Secondary | ICD-10-CM | POA: Diagnosis not present

## 2023-06-20 ENCOUNTER — Other Ambulatory Visit: Payer: Self-pay | Admitting: Internal Medicine

## 2023-06-20 ENCOUNTER — Other Ambulatory Visit: Payer: Self-pay | Admitting: Cardiology

## 2023-07-08 ENCOUNTER — Encounter: Payer: Self-pay | Admitting: Internal Medicine

## 2023-07-08 DIAGNOSIS — E1159 Type 2 diabetes mellitus with other circulatory complications: Secondary | ICD-10-CM

## 2023-07-08 MED ORDER — TIRZEPATIDE 10 MG/0.5ML ~~LOC~~ SOAJ: 10.0000 mg | SUBCUTANEOUS | 1 refills | Status: DC

## 2023-07-11 ENCOUNTER — Other Ambulatory Visit (HOSPITAL_COMMUNITY): Payer: Self-pay

## 2023-07-11 ENCOUNTER — Telehealth: Payer: Self-pay

## 2023-07-11 NOTE — Telephone Encounter (Signed)
 Test claims for Dulera  alternatives:   Wixela- $10.00 Breo Ellipta - $100.00

## 2023-07-11 NOTE — Telephone Encounter (Signed)
 Cheapest alternative seems to be the Generic Advair Diskus for $10.00

## 2023-07-12 ENCOUNTER — Other Ambulatory Visit: Payer: Self-pay | Admitting: Internal Medicine

## 2023-07-14 DIAGNOSIS — J9601 Acute respiratory failure with hypoxia: Secondary | ICD-10-CM | POA: Diagnosis not present

## 2023-07-14 DIAGNOSIS — J449 Chronic obstructive pulmonary disease, unspecified: Secondary | ICD-10-CM | POA: Diagnosis not present

## 2023-07-14 DIAGNOSIS — G4733 Obstructive sleep apnea (adult) (pediatric): Secondary | ICD-10-CM | POA: Diagnosis not present

## 2023-07-16 ENCOUNTER — Other Ambulatory Visit: Payer: Self-pay | Admitting: Internal Medicine

## 2023-07-18 ENCOUNTER — Encounter: Payer: Self-pay | Admitting: Adult Health

## 2023-07-18 ENCOUNTER — Ambulatory Visit: Payer: BC Managed Care – PPO | Admitting: Internal Medicine

## 2023-07-18 NOTE — Telephone Encounter (Signed)
 Do they make a travel Bipap? Can we call DME company and ask

## 2023-07-18 NOTE — Telephone Encounter (Signed)
 Pt asking for travel cpap, her last visit was 11-26-2022 on bipap doing well.  Has last sleep study was 2020.  Set up last 01-06-2019.  Do you require a sleep study for this?

## 2023-07-20 ENCOUNTER — Other Ambulatory Visit: Payer: Self-pay | Admitting: *Deleted

## 2023-07-20 NOTE — Telephone Encounter (Signed)
 Did we ever hear back from DME company.  I do not think they make a travel BiPAP.  Can we verify that and let the patient know

## 2023-07-22 ENCOUNTER — Other Ambulatory Visit: Payer: Self-pay | Admitting: Internal Medicine

## 2023-07-22 DIAGNOSIS — F419 Anxiety disorder, unspecified: Secondary | ICD-10-CM

## 2023-08-03 ENCOUNTER — Ambulatory Visit: Admitting: Cardiology

## 2023-08-08 ENCOUNTER — Ambulatory Visit: Admitting: Internal Medicine

## 2023-08-10 ENCOUNTER — Encounter: Payer: Self-pay | Admitting: Internal Medicine

## 2023-08-10 ENCOUNTER — Ambulatory Visit (INDEPENDENT_AMBULATORY_CARE_PROVIDER_SITE_OTHER): Admitting: Internal Medicine

## 2023-08-10 ENCOUNTER — Ambulatory Visit: Admitting: Internal Medicine

## 2023-08-10 VITALS — BP 95/60 | HR 76 | Ht 64.0 in | Wt 204.0 lb

## 2023-08-10 VITALS — BP 144/66 | HR 69 | Ht 64.0 in | Wt 202.0 lb

## 2023-08-10 DIAGNOSIS — J4489 Other specified chronic obstructive pulmonary disease: Secondary | ICD-10-CM

## 2023-08-10 DIAGNOSIS — E782 Mixed hyperlipidemia: Secondary | ICD-10-CM

## 2023-08-10 DIAGNOSIS — F17210 Nicotine dependence, cigarettes, uncomplicated: Secondary | ICD-10-CM

## 2023-08-10 DIAGNOSIS — I2721 Secondary pulmonary arterial hypertension: Secondary | ICD-10-CM

## 2023-08-10 DIAGNOSIS — E1165 Type 2 diabetes mellitus with hyperglycemia: Secondary | ICD-10-CM | POA: Diagnosis not present

## 2023-08-10 DIAGNOSIS — I25118 Atherosclerotic heart disease of native coronary artery with other forms of angina pectoris: Secondary | ICD-10-CM | POA: Diagnosis not present

## 2023-08-10 DIAGNOSIS — I10 Essential (primary) hypertension: Secondary | ICD-10-CM

## 2023-08-10 DIAGNOSIS — Z23 Encounter for immunization: Secondary | ICD-10-CM

## 2023-08-10 DIAGNOSIS — F419 Anxiety disorder, unspecified: Secondary | ICD-10-CM | POA: Diagnosis not present

## 2023-08-10 DIAGNOSIS — R058 Other specified cough: Secondary | ICD-10-CM | POA: Diagnosis not present

## 2023-08-10 DIAGNOSIS — G4733 Obstructive sleep apnea (adult) (pediatric): Secondary | ICD-10-CM

## 2023-08-10 DIAGNOSIS — K219 Gastro-esophageal reflux disease without esophagitis: Secondary | ICD-10-CM

## 2023-08-10 MED ORDER — BUDESONIDE-FORMOTEROL FUMARATE 80-4.5 MCG/ACT IN AERO
INHALATION_SPRAY | RESPIRATORY_TRACT | 11 refills | Status: DC
Start: 2023-08-10 — End: 2023-08-10

## 2023-08-10 MED ORDER — METFORMIN HCL 1000 MG PO TABS: 1000.0000 mg | ORAL_TABLET | Freq: Two times a day (BID) | ORAL | 3 refills | Status: DC

## 2023-08-10 MED ORDER — BUDESONIDE-FORMOTEROL FUMARATE 80-4.5 MCG/ACT IN AERO: INHALATION_SPRAY | RESPIRATORY_TRACT | 3 refills | Status: DC

## 2023-08-10 MED ORDER — FREESTYLE LIBRE 3 PLUS SENSOR MISC: 3 refills | Status: AC

## 2023-08-10 NOTE — Progress Notes (Unsigned)
 [name], female    DOB: 1955-09-06    MRN: [id number]   Brief patient profile:  32   yowf  active smoker  referred to pulmonary clinic in [place]  11/09/2022 by Dr Melvenia for new doe Jan 2024 corrected with [place] then Sept 21st with abrupt chest and sinus congestion > yellow mucus rocepin/ amox then levaquin and prednisone  and completing 11/10/22.  Cx spine surgery around 2010 > upper airway wheeze ever since     History of Present Illness  11/09/2022  Pulmonary/ 1st office eval/ Meher Kucinski / [place] [facility]  Chief Complaint  Patient presents with   Consult  Dyspnea:  85% better and still on last doses of abx /pred Cough: still some light green mucus with sense of nasal/ chest congestion on mucinex  dm  Sleep: 30 degrees hob  SABA use: couple times a day at most  02: none  Lung cancer screen: per PCP but would not start until sort out the present problems  Rec Plan A = Automatic = Always=    Breztri  (symbicort  80) Take 2 puffs first thing in am and then another 2 puffs about 12 hours later.   Work on inhaler technique:  >>>  Remember how golfers warm up by taking practice swings - do this with an empty inhaler  Plan B = Backup (to supplement plan A, not to replace it) Only use your albuterol  inhaler as a rescue medication The key is to stop smoking completely before smoking completely stops you! For cough/ congestion > mucinex  or mucinex  dm  up to maximum of  1200 mg every 12 hours as needed  My office will be contacting you by phone for referral for repeat CT 3  Please schedule a follow up visit in 3 months but call sooner if needed  with all medications /inhalers/ solutions in hand    02/09/2023  f/u ov/[place] office/Skyelynn Rambeau re: AB  maint on dulera   did bring inhalers  Chief Complaint  Patient presents with   Pulmonary Nodule  Dyspnea:  improved  Cough: freq throat clearing assoc with sensation  of pnds  Sleeping: ok s   resp cc  SABA use: none 02:  none Rec Valsartan  40 mg one daily in place of lisinopril   Try zyrtec  OTC at bedtime  Work on inhaler technique:     08/10/2023  f/u ov/[place] office/Kendel Pesnell re: AB ? Acei case vs resdual UACS from neck surgery 2010  maint on no rx/ still smoking   Chief Complaint  Patient presents with   Follow-up    Increased SOB past couple of months- relates to hot weather. She has also had some wheezing. Ran out of Dulera  in Feb 2025 and has been using her albuterol  inhaler 4-5 x per day on average.   Dyspnea:  worse off dulera   Cough: minimal pnds much better off acei  Sleeping: flat bed one pillow s    resp cc cpap/ 2lpm  SABA use: 4-5 x time per day  02: 2lpm hs only   Lung cancer screening: due q  Jan 2026    No obvious day to day or daytime variability or assoc excess/ purulent sputum or mucus plugs or hemoptysis or cp or chest tightness,   overt  hb symptoms.    Also denies any obvious fluctuation of symptoms with weather or environmental changes or other aggravating or alleviating factors except as outlined above   No unusual exposure hx or h/o childhood pna/ asthma or knowledge of  premature birth.  Current Allergies, Complete Past Medical History, Past Surgical History, Family History, and Social History were reviewed in [employer] record.  ROS  The following are not active complaints unless bolded Hoarseness, sore throat, dysphagia, dental problems, itching, sneezing,  nasal congestion or sensation of discharge of excess mucus or purulent secretions, ear ache,   fever, chills, sweats, unintended wt loss or wt gain, classically pleuritic or exertional cp,  orthopnea pnd or arm/hand swelling  or leg swelling, presyncope, palpitations, abdominal pain, anorexia, nausea, vomiting, diarrhea  or change in bowel habits or change in bladder habits, change in stools or change in urine, dysuria, hematuria,  rash, arthralgias, visual complaints, headache, numbness, weakness or  ataxia or problems with walking or coordination,  change in mood or  memory.        Current Meds  Medication Sig   acetaminophen  (TYLENOL ) 325 MG tablet Take 650 mg by mouth every 6 (six) hours as needed for moderate pain (pain score 4-6).   albuterol  (VENTOLIN  HFA) 108 (90 Base) MCG/ACT inhaler TAKE 2 PUFFS BY MOUTH EVERY 6 HOURS AS NEEDED FOR WHEEZE OR SHORTNESS OF BREATH   aspirin  EC 81 MG tablet Take 81 mg by mouth daily.   atorvastatin  (LIPITOR ) 40 MG tablet TAKE 1 TABLET (40 MG TOTAL) BY MOUTH EVERY OTHER DAY. TAKE AT DINNER   Continuous Glucose Sensor (FREESTYLE LIBRE 3 SENSOR) MISC 1 each by Does not apply route every 14 (fourteen) days. Place 1 sensor on the skin every 14 days. Use to check glucose continuously   escitalopram  (LEXAPRO ) 20 MG tablet TAKE 1 TABLET (20 MG TOTAL) BY MOUTH DAILY AT 6 (SIX) AM.   FARXIGA  10 MG TABS tablet TAKE 1 TABLET BY MOUTH EVERY DAY   fluticasone  (FLONASE ) 50 MCG/ACT nasal spray Place 1 spray into both nostrils daily.   furosemide  (LASIX ) 20 MG tablet TAKE 1 TABLET BY MOUTH EVERY DAY   isosorbide  mononitrate (IMDUR ) 30 MG 24 hr tablet TAKE 1 TABLET BY MOUTH EVERY DAY   metFORMIN  (GLUCOPHAGE ) 1000 MG tablet TAKE 1 TABLET BY MOUTH TWICE A DAY   metoprolol  succinate (TOPROL -XL) 25 MG 24 hr tablet TAKE 1.5 TABLETS BY MOUTH DAILY   nystatin  cream (MYCOSTATIN ) Apply 1 Application topically 2 (two) times daily.   pantoprazole  (PROTONIX ) 40 MG tablet TAKE 1 TABLET BY MOUTH TWICE A DAY   tirzepatide  (MOUNJARO ) 10 MG/0.5ML Pen Inject 10 mg into the skin once a week.   valsartan  (DIOVAN ) 40 MG tablet Take 1 tablet (40 mg total) by mouth daily.             Past Medical History:  Diagnosis Date   Anxiety    Aortic stenosis    CAD (coronary artery disease)    a. 10/2014: Ant STEMI s/p DES to dLAD   Carotid disease, bilateral (HCC)    a. Duplex 12/2014: mild-mod atherosclerotic plaque without hemodynamically significant stenosis.   COPD (chronic  obstructive pulmonary disease) (HCC)    Essential hypertension    HLD (hyperlipidemia)    Morbid obesity (HCC)    Myocardial infarction (HCC) 11/17/2014   PONV (postoperative nausea and vomiting)    Shingles    Sleep apnea    Uses CPAP   Type 2 diabetes mellitus (HCC)       Objective:    Wts   08/10/2023       202  02/09/23 188 lb (85.3 kg)  01/14/23 193 lb (87.5 kg)  11/09/22 200 lb 6.4  oz (90.9 kg)    Vital signs reviewed  08/10/2023  - Note at rest 02 sats  95% on RA   General appearance:    amb wf with pominent pseudowheeze      HEENT : Oropharynx  clear/ top dentrue  Nasal turbinates nl    NECK :  without  apparent JVD/ palpable Nodes/TM    LUNGS: no acc muscle use,  Min barrel  contour chest wall with bilateral  slightly decreased bs and transmitted wheeeze from upper airway  and  without cough on insp or exp maneuvers and min  Hyperresonant  to  percussion bilaterally    CV:  RRR  no s3 or murmur or increase in P2, and no edema   ABD:  soft and nontender with pos end  insp Hoover's  in the supine position.  No bruits or organomegaly appreciated   MS:  Nl gait/ ext warm without deformities Or obvious joint restrictions  calf tenderness, cyanosis or clubbing     SKIN: warm and dry without lesions    NEURO:  alert, approp, nl sensorium with  no motor or cerebellar deficits apparent.               Assessment

## 2023-08-10 NOTE — Assessment & Plan Note (Signed)

## 2023-08-10 NOTE — Assessment & Plan Note (Signed)
 Lab Results  Component Value Date   HGBA1C 6.9 (H) 01/14/2023   Overall well-controlled On Mounjaro  10 mg QW, metformin  1000 mg twice daily and Farxiga  10 mg daily Advised to follow diabetic diet On statin and ARB F/u CMP and lipid panel Diabetic eye exam: Advised to follow up with Ophthalmology for diabetic eye exam

## 2023-08-10 NOTE — Progress Notes (Unsigned)
 Established Patient Office Visit  Subjective:  Patient ID: [name], female    DOB: 1956/01/12  Age: 67 y.o. MRN: [id number]  CC:  Chief Complaint  Patient presents with   Diabetes    Follow up.     HPI [name] is a 68 y.o. female with past medical history of CAD s/p stent placement, HTN, OSA, asthmatic bronchitis, type II DM, GERD, HLD and GAD who presents for f/u of her chronic medical conditions.  CAD, HTN: She has history of stent placement for STEMI.  She is on aspirin  and atorvastatin  40 mg QD.  She is also on valsartan  40 mg daily, metoprolol  37.5 mg daily and Imdur  30 mg daily.  She also takes Lasix  20 mg QD due to history of acute CHF and chronic leg swelling.  Denies any headache, dizziness, chest pain, dyspnea or palpitations.  She uses BiPAP regularly for OSA.  Followed by pulmonology.  Type II DM: Her last HbA1c was 6.9 in 12/24.  She takes Mounjaro  10 mg QW, metformin  1000 mg twice daily and Farxiga  10 mg daily.  Denies polyuria or polyphagia. She has CGM, average blood glucose over the last 7 days was 166.      Past Medical History:  Diagnosis Date   Anxiety    Aortic stenosis    CAD (coronary artery disease)    a. 10/2014: Ant STEMI s/p DES to dLAD   Carotid disease, bilateral (HCC)    a. Duplex 12/2014: mild-mod atherosclerotic plaque without hemodynamically significant stenosis.   COPD (chronic obstructive pulmonary disease) (HCC)    Essential hypertension    HLD (hyperlipidemia)    Morbid obesity (HCC)    Myocardial infarction (HCC) 11/17/2014   PONV (postoperative nausea and vomiting)    Shingles    Sleep apnea    Uses CPAP   Type 2 diabetes mellitus [facility])     Past Surgical History:  Procedure Laterality Date   BREAST BIOPSY Left    2016   CARDIAC CATHETERIZATION N/A 11/17/2014   Procedure: Left Heart Cath and Coronary Angiography;  Surgeon: Deatrice DELENA Cage, MD;  Location: [facility];  Service: Cardiovascular;  Laterality:  N/A;   CARDIAC CATHETERIZATION N/A 11/17/2014   Procedure: Coronary Stent Intervention;  Surgeon: Deatrice DELENA Cage, MD;  Location: [facility];  Service: Cardiovascular;  Laterality: N/A;  distal lad 2.25x20 promus   CATARACT EXTRACTION W/PHACO Left 09/20/2016   Procedure: CATARACT EXTRACTION PHACO AND INTRAOCULAR LENS PLACEMENT (IOC);  Surgeon: Perley Hamilton, MD;  Location: [facility] ORS;  Service: Ophthalmology;  Laterality: Left;  CDE: 9.27   CATARACT EXTRACTION W/PHACO Right 10/18/2016   Procedure: CATARACT EXTRACTION PHACO AND INTRAOCULAR LENS PLACEMENT (IOC);  Surgeon: Perley Hamilton, MD;  Location: [facility] OR[facility];  Service: Ophthalmology;  Laterality: Right;  CDE: 6.38   CHOLECYSTECTOMY     COLONOSCOPY WITH PROPOFOL  N/A 02/16/2021   Procedure: COLONOSCOPY WITH PROPOFOL ;  Surgeon: Cindie Carlin POUR, DO;  Location: [facility];  Service: Endoscopy;  Laterality: N/A;  1:30 / ASA 3   POLYPECTOMY  02/16/2021   Procedure: POLYPECTOMY;  Surgeon: Cindie Carlin POUR, DO;  Location: [facility];  Service: Endoscopy;;   SPINAL FUSION     cervical; screws and plates.   TOTAL ABDOMINAL HYSTERECTOMY  1980    Family History  Problem Relation Age of Onset   Heart attack Father    Heart disease Father    Cancer Brother     Social History  Socioeconomic History   Marital status: Widowed    Spouse name: Not on file   Number of children: Not on file   Years of education: Not on file   Highest education level: Associate degree: occupational, Scientist, product/process development, or vocational program  Occupational History   Occupation: Office work for [employer]  Tobacco Use   Smoking status: Every Day    Current packs/day: 0.50    Average packs/day: 0.5 packs/day for 50.6 years (25.3 ttl pk-yrs)    Types: Cigarettes    Start date: 12/26/1972   Smokeless tobacco: Never   Tobacco comments:    3 cigs per day  Vaping Use   Vaping status: Never Used  Substance and Sexual Activity   Alcohol  use: No     Alcohol /week: 0.0 standard drinks of alcohol    Drug use: No   Sexual activity: Yes    Partners: Male    Birth control/protection: Surgical  Other Topics Concern   Not on file  Social History Narrative   Not on file   Social Drivers of Health   Financial Resource Strain: Low Risk  (08/06/2023)   Overall Financial Resource Strain (CARDIA)    Difficulty of Paying Living Expenses: Not hard at all  Food Insecurity: No Food Insecurity (08/06/2023)   Hunger Vital Sign    Worried About Running Out of Food in the Last Year: Never true    Ran Out of Food in the Last Year: Never true  Transportation Needs: No Transportation Needs (08/06/2023)   PRAPARE - Administrator, Civil Service (Medical): No    Lack of Transportation (Non-Medical): No  Physical Activity: Insufficiently Active (08/06/2023)   Exercise Vital Sign    Days of Exercise per Week: 2 days    Minutes of Exercise per Session: 30 min  Stress: No Stress Concern Present (08/06/2023)   [employer] of Occupational Health - Occupational Stress Questionnaire    Feeling of Stress: Not at all  Social Connections: Moderately Integrated (08/06/2023)   Social Connection and Isolation Panel    Frequency of Communication with Friends and Family: More than three times a week    Frequency of Social Gatherings with Friends and Family: More than three times a week    Attends Religious Services: More than 4 times per year    Active Member of [employer] or Organizations: Yes    Attends Banker Meetings: More than 4 times per year    Marital Status: Widowed  Intimate Partner Violence: Not At Risk (03/13/2023)   Humiliation, Afraid, Rape, and Kick questionnaire    Fear of Current or Ex-Partner: No    Emotionally Abused: No    Physically Abused: No    Sexually Abused: No    Outpatient Medications Prior to Visit  Medication Sig Dispense Refill   acetaminophen  (TYLENOL ) 325 MG tablet Take 650 mg by mouth every 6 (six) hours  as needed for moderate pain (pain score 4-6).     albuterol  (VENTOLIN  HFA) 108 (90 Base) MCG/ACT inhaler TAKE 2 PUFFS BY MOUTH EVERY 6 HOURS AS NEEDED FOR WHEEZE OR SHORTNESS OF BREATH 6.7 each 3   aspirin  EC 81 MG tablet Take 81 mg by mouth daily.     atorvastatin  (LIPITOR ) 40 MG tablet TAKE 1 TABLET (40 MG TOTAL) BY MOUTH EVERY OTHER DAY. TAKE AT DINNER 45 tablet 1   budesonide -formoterol  (SYMBICORT ) 80-4.5 MCG/ACT inhaler Take 2 puffs first thing in am and then another 2 puffs about 12 hours later. 30.6  each 3   escitalopram  (LEXAPRO ) 20 MG tablet TAKE 1 TABLET (20 MG TOTAL) BY MOUTH DAILY AT 6 (SIX) AM. 90 tablet 1   FARXIGA  10 MG TABS tablet TAKE 1 TABLET BY MOUTH EVERY DAY 90 tablet 0   fluticasone  (FLONASE ) 50 MCG/ACT nasal spray Place 1 spray into both nostrils daily. 18 g 0   furosemide  (LASIX ) 20 MG tablet TAKE 1 TABLET BY MOUTH EVERY DAY 90 tablet 3   isosorbide  mononitrate (IMDUR ) 30 MG 24 hr tablet TAKE 1 TABLET BY MOUTH EVERY DAY 90 tablet 1   metoprolol  succinate (TOPROL -XL) 25 MG 24 hr tablet TAKE 1.5 TABLETS BY MOUTH DAILY 135 tablet 3   nystatin  cream (MYCOSTATIN ) Apply 1 Application topically 2 (two) times daily. 30 g 0   pantoprazole  (PROTONIX ) 40 MG tablet TAKE 1 TABLET BY MOUTH TWICE A DAY 180 tablet 1   tirzepatide  (MOUNJARO ) 10 MG/0.5ML Pen Inject 10 mg into the skin once a week. 6 mL 1   valsartan  (DIOVAN ) 40 MG tablet Take 1 tablet (40 mg total) by mouth daily. 90 tablet 3   Continuous Glucose Sensor (FREESTYLE LIBRE 3 SENSOR) MISC 1 each by Does not apply route every 14 (fourteen) days. Place 1 sensor on the skin every 14 days. Use to check glucose continuously 6 each 3   metFORMIN  (GLUCOPHAGE ) 1000 MG tablet TAKE 1 TABLET BY MOUTH TWICE A DAY 180 tablet 0   No facility-administered medications prior to visit.    Allergies  Allergen Reactions   Codeine Nausea And Vomiting   Sulfa Antibiotics Rash    ROS Review of Systems  Constitutional:  Negative for chills  and fever.  HENT:  Negative for congestion, sinus pressure, sinus pain and sore throat.   Eyes:  Negative for pain and discharge.  Respiratory:  Negative for cough and shortness of breath.   Cardiovascular:  Negative for chest pain and palpitations.  Gastrointestinal:  Negative for abdominal pain, diarrhea, nausea and vomiting.  Endocrine: Negative for polydipsia and polyuria.  Genitourinary:  Negative for dysuria and hematuria.  Musculoskeletal:  Negative for neck pain and neck stiffness.  Skin:  Negative for rash.  Neurological:  Negative for dizziness and weakness.  Psychiatric/Behavioral:  Negative for agitation and behavioral problems.       Objective:    Physical Exam Vitals reviewed.  Constitutional:      General: She is not in acute distress.    Appearance: She is obese. She is not diaphoretic.  HENT:     Head: Normocephalic and atraumatic.     Nose: Nose normal. No congestion.     Mouth/Throat:     Mouth: Mucous membranes are moist.     Pharynx: No posterior oropharyngeal erythema.  Eyes:     General: No scleral icterus.    Extraocular Movements: Extraocular movements intact.  Cardiovascular:     Rate and Rhythm: Normal rate and regular rhythm.     Heart sounds: Normal heart sounds. No murmur heard. Pulmonary:     Breath sounds: Normal breath sounds. No wheezing or rales.  Musculoskeletal:     Cervical back: Neck supple. No tenderness.     Right lower leg: No edema.     Left lower leg: No edema.  Skin:    General: Skin is warm.     Findings: No rash.  Neurological:     General: No focal deficit present.     Mental Status: She is alert and oriented to person, place, and time.  Psychiatric:        Mood and Affect: Mood normal.        Behavior: Behavior normal.     BP 95/60   Pulse 76   Ht 5' 4 (1.626 m)   Wt 204 lb (92.5 kg)   SpO2 98%   BMI 35.02 kg/m  Wt Readings from Last 3 Encounters:  08/10/23 204 lb (92.5 kg)  08/10/23 202 lb (91.6 kg)   03/22/23 194 lb 3.2 oz (88.1 kg)    Lab Results  Component Value Date   TSH 1.805 03/13/2023   Lab Results  Component Value Date   WBC 4.6 03/14/2023   HGB 12.6 03/14/2023   HCT 40.0 03/14/2023   MCV 93.2 03/14/2023   PLT 182 03/14/2023   Lab Results  Component Value Date   NA 143 08/10/2023   K 4.6 08/10/2023   CO2 21 08/10/2023   GLUCOSE 138 (H) 08/10/2023   BUN 22 08/10/2023   CREATININE 1.11 (H) 08/10/2023   BILITOT 0.4 08/10/2023   ALKPHOS 93 08/10/2023   AST 30 08/10/2023   ALT 44 (H) 08/10/2023   PROT 7.2 08/10/2023   ALBUMIN 4.3 08/10/2023   CALCIUM  9.5 08/10/2023   ANIONGAP 7 03/14/2023   EGFR 54 (L) 08/10/2023   Lab Results  Component Value Date   CHOL 114 08/10/2023   Lab Results  Component Value Date   HDL 51 08/10/2023   Lab Results  Component Value Date   LDLCALC 44 08/10/2023   Lab Results  Component Value Date   TRIG 105 08/10/2023   Lab Results  Component Value Date   CHOLHDL 2.2 08/10/2023   Lab Results  Component Value Date   HGBA1C 6.9 (H) 08/10/2023      Assessment & Plan:   Problem List Items Addressed This Visit       Cardiovascular and Mediastinum   CAD (coronary artery disease)   History of STEMI in 2016, s/p DES placement On aspirin  and atorvastatin  40 mg daily On metoprolol  37.5 mg QD On Imdur  30 mg QD Followed by cardiology      Essential hypertension   BP Readings from Last 1 Encounters:  08/10/23 95/60   Well-controlled with valsartan  40 mg QD, metoprolol  37.5 mg QD and Imdur  30 mg QD Takes Lasix  20 mg QD for chronic leg swelling Counseled for compliance with the medications Advised DASH diet and moderate exercise/walking, at least 150 mins/week       Pulmonary artery hypertension (HCC)   Noted on echocardiogram and CT chest Takes Lasix  20 mg QD for leg swelling and dyspnea Followed by pulmonology        Respiratory   Sleep apnea   Uses BiPAP regularly, continues to benefit from it Followed  by pulmonology      Asthmatic bronchitis , chronic (HCC)   He has chronic cough Was evaluated by pulmonology, going to start Symbicort  as maintenance inhaler Has albuterol  as needed for dyspnea wheezing Has been trying to cut down smoking        Digestive   GERD (gastroesophageal reflux disease)   Well controlled with pantoprazole  40 mg BID Advised to take pantoprazole  QD, can try Pepcid as needed in the PM Avoid hot and spicy food        Endocrine   Type 2 diabetes mellitus with hyperglycemia (HCC) - Primary   Lab Results  Component Value Date   HGBA1C 6.9 (H) 01/14/2023   Overall well-controlled On Mounjaro  10 mg QW, metformin   1000 mg BID and Farxiga  10 mg QD Uses CGM, continues to benefit from it Advised to follow diabetic diet On statin and ARB F/u CMP and lipid panel Diabetic eye exam: Advised to follow up with Ophthalmology for diabetic eye exam      Relevant Medications   Continuous Glucose Sensor (FREESTYLE LIBRE 3 PLUS SENSOR) MISC   metFORMIN  (GLUCOPHAGE ) 1000 MG tablet   Other Relevant Orders   CMP14+EGFR (Completed)   Hemoglobin A1c (Completed)   Urine Microalbumin w/creat. ratio     Other   Anxiety (Chronic)   Well-controlled with Lexapro  20 mg QD      Mixed hyperlipidemia   On atorvastatin  40 mg QD      Relevant Orders   Lipid Profile (Completed)   Other Visit Diagnoses       Encounter for immunization       Relevant Orders   Varicella-zoster vaccine IM (Completed)       Meds ordered this encounter  Medications   Continuous Glucose Sensor (FREESTYLE LIBRE 3 PLUS SENSOR) MISC    Sig: Change sensor every 15 days.    Dispense:  6 each    Refill:  3   metFORMIN  (GLUCOPHAGE ) 1000 MG tablet    Sig: Take 1 tablet (1,000 mg total) by mouth 2 (two) times daily.    Dispense:  180 tablet    Refill:  3    Follow-up: Return in about 5 months (around 01/10/2024) for Annual physical (after 01/14/24).    Suzzane MARLA Blanch, MD

## 2023-08-10 NOTE — Patient Instructions (Addendum)
 Try symbicort  80 or Breya 80 Take 2 puffs first thing in am and then another 2 puffs about 12 hours later In place of dulera    My office will be contacting you by phone for referral to ENT for your throat wheezing   - if you don't hear back from my office within one week please call us  back or notify us  thru MyChart and we'll address it right away.   The key is to stop smoking completely before smoking completely stops you!     Please schedule a follow up visit in 6 months but call sooner if needed

## 2023-08-10 NOTE — Patient Instructions (Signed)
 Please continue to take medications as prescribed.  Please continue to follow low carb diet and perform moderate exercise/walking at least 150 mins/week.

## 2023-08-11 ENCOUNTER — Ambulatory Visit: Payer: Self-pay | Admitting: Internal Medicine

## 2023-08-11 LAB — CMP14+EGFR
ALT: 44 IU/L — ABNORMAL HIGH (ref 0–32)
AST: 30 IU/L (ref 0–40)
Albumin: 4.3 g/dL (ref 3.9–4.9)
Alkaline Phosphatase: 93 IU/L (ref 44–121)
BUN/Creatinine Ratio: 20 (ref 12–28)
BUN: 22 mg/dL (ref 8–27)
Bilirubin Total: 0.4 mg/dL (ref 0.0–1.2)
CO2: 21 mmol/L (ref 20–29)
Calcium: 9.5 mg/dL (ref 8.7–10.3)
Chloride: 101 mmol/L (ref 96–106)
Creatinine, Ser: 1.11 mg/dL — ABNORMAL HIGH (ref 0.57–1.00)
Globulin, Total: 2.9 g/dL (ref 1.5–4.5)
Glucose: 138 mg/dL — ABNORMAL HIGH (ref 70–99)
Potassium: 4.6 mmol/L (ref 3.5–5.2)
Sodium: 143 mmol/L (ref 134–144)
Total Protein: 7.2 g/dL (ref 6.0–8.5)
eGFR: 54 mL/min/1.73 — ABNORMAL LOW (ref >59)

## 2023-08-11 LAB — HEMOGLOBIN A1C
Est. average glucose Bld gHb Est-mCnc: 151 mg/dL
Hgb A1c MFr Bld: 6.9 % — ABNORMAL HIGH (ref 4.8–5.6)

## 2023-08-11 LAB — LIPID PANEL
Chol/HDL Ratio: 2.2 ratio (ref 0.0–4.4)
Cholesterol, Total: 114 mg/dL (ref 100–199)
HDL: 51 mg/dL (ref >39)
LDL Chol Calc (NIH): 44 mg/dL (ref 0–99)
Triglycerides: 105 mg/dL (ref 0–149)
VLDL Cholesterol Cal: 19 mg/dL (ref 5–40)

## 2023-08-11 NOTE — Assessment & Plan Note (Signed)
 Uses BiPAP regularly, continues to benefit from it Followed by pulmonology

## 2023-08-11 NOTE — Assessment & Plan Note (Signed)
 Well controlled with pantoprazole  40 mg BID Advised to take pantoprazole  QD, can try Pepcid as needed in the PM Avoid hot and spicy food

## 2023-08-11 NOTE — Assessment & Plan Note (Signed)
 Well-controlled with Lexapro 20 mg QD

## 2023-08-11 NOTE — Assessment & Plan Note (Addendum)
 Referred to ENT 08/10/2023  -  Cx spine surgery around 2010 > upper airway wheeze  ever since -  D/c'd ACEi  02/09/23 with minimal improvement   F/v loop does not support airway obstruction but quit obvious today so rec   1) keep off ACEi 2) max gerd RX  3) min doses of ICS  4) keep appt with ENT   Discussed in detail all the  indications, usual  risks and alternatives  relative to the benefits with patient who agrees to proceed with Rx as outlined.         Each maintenance medication was reviewed in detail including emphasizing most importantly the difference between maintenance and prns and under what circumstances the prns are to be triggered using an action plan format where appropriate.  Total time for H and P, chart review, counseling, reviewing hfa device(s) and generating customized AVS unique to this office visit / same day charting = 25 min

## 2023-08-11 NOTE — Assessment & Plan Note (Signed)
 History of STEMI in 2016, s/p DES placement On aspirin  and atorvastatin  40 mg daily On metoprolol  37.5 mg QD On Imdur  30 mg QD Followed by cardiology

## 2023-08-11 NOTE — Assessment & Plan Note (Signed)
 Noted on echocardiogram and CT chest Takes Lasix  20 mg QD for leg swelling and dyspnea Followed by pulmonology

## 2023-08-11 NOTE — Assessment & Plan Note (Signed)
 D/c acei 02/09/2023 due to throat clearing > minimal improvement 08/10/2023  Although even in retrospect it may not be clear the ACEi contributed to the pt's symptoms,  Pt improved off them and adding them back at this point or in the future would risk confusion in interpretation of non-specific respiratory symptoms to which this patient is prone  ie  Better not to muddy the waters here.   >>> continue diovan  40 mg daily

## 2023-08-11 NOTE — Assessment & Plan Note (Signed)
On atorvastatin 40 mg QD

## 2023-08-11 NOTE — Assessment & Plan Note (Addendum)
 BP Readings from Last 1 Encounters:  08/10/23 95/60   Well-controlled with valsartan  40 mg QD, metoprolol  37.5 mg QD and Imdur  30 mg QD Takes Lasix  20 mg QD for chronic leg swelling Counseled for compliance with the medications Advised DASH diet and moderate exercise/walking, at least 150 mins/week

## 2023-08-11 NOTE — Assessment & Plan Note (Signed)
 He has chronic cough Was evaluated by pulmonology, going to start Symbicort  as maintenance inhaler Has albuterol  as needed for dyspnea wheezing Has been trying to cut down smoking

## 2023-08-11 NOTE — Assessment & Plan Note (Signed)
 Active smoker - 11/09/2022  After extensive coaching inhaler device,  effectiveness =    60% short > try off breo due to cough and start symbicort  80 or dulera  100 2bid and return for pfts - PFT's  01/11/23  FEV1 1.32 (55 % ) ratio 0.81  p 7 % improvement from saba p dulera  prior to study with DLCO  11.33 (58%)   and FV curve no obstruction and ERV 33%  at wt 206   - 02/09/2023  After extensive coaching inhaler device,  effectiveness =    80% (delayed insp) > continue dulera  100 2bid / add zyrtec  for rhinitis and d/c acei   >>>  08/10/2023 ov : worse off dulera  due to insurance > try symbicort  80 or breyna  80

## 2023-08-12 LAB — MICROALBUMIN / CREATININE URINE RATIO
Creatinine, Urine: 55.7 mg/dL
Microalb/Creat Ratio: 323 mg/g{creat} — ABNORMAL HIGH (ref 0–29)
Microalbumin, Urine: 180.1 ug/mL

## 2023-08-13 DIAGNOSIS — G4733 Obstructive sleep apnea (adult) (pediatric): Secondary | ICD-10-CM | POA: Diagnosis not present

## 2023-08-13 DIAGNOSIS — J449 Chronic obstructive pulmonary disease, unspecified: Secondary | ICD-10-CM | POA: Diagnosis not present

## 2023-08-19 ENCOUNTER — Encounter (INDEPENDENT_AMBULATORY_CARE_PROVIDER_SITE_OTHER): Payer: Self-pay

## 2023-08-22 ENCOUNTER — Ambulatory Visit: Admitting: Cardiology

## 2023-09-13 DIAGNOSIS — J9601 Acute respiratory failure with hypoxia: Secondary | ICD-10-CM | POA: Diagnosis not present

## 2023-09-13 DIAGNOSIS — J449 Chronic obstructive pulmonary disease, unspecified: Secondary | ICD-10-CM | POA: Diagnosis not present

## 2023-09-22 ENCOUNTER — Other Ambulatory Visit: Payer: Self-pay | Admitting: Internal Medicine

## 2023-09-28 ENCOUNTER — Encounter (HOSPITAL_COMMUNITY): Payer: Self-pay | Admitting: *Deleted

## 2023-09-28 ENCOUNTER — Emergency Department (HOSPITAL_COMMUNITY)

## 2023-09-28 ENCOUNTER — Other Ambulatory Visit: Payer: Self-pay

## 2023-09-28 ENCOUNTER — Emergency Department (HOSPITAL_COMMUNITY)
Admission: EM | Admit: 2023-09-28 | Discharge: 2023-09-28 | Disposition: A | Discharge status: Home / Self Care | Attending: Emergency Medicine | Admitting: Emergency Medicine

## 2023-09-28 DIAGNOSIS — S20212A Contusion of left front wall of thorax, initial encounter: Secondary | ICD-10-CM | POA: Insufficient documentation

## 2023-09-28 DIAGNOSIS — W19XXXA Unspecified fall, initial encounter: Secondary | ICD-10-CM | POA: Diagnosis not present

## 2023-09-28 DIAGNOSIS — Z7982 Long term (current) use of aspirin: Secondary | ICD-10-CM | POA: Diagnosis not present

## 2023-09-28 DIAGNOSIS — R55 Syncope and collapse: Secondary | ICD-10-CM | POA: Diagnosis not present

## 2023-09-28 LAB — CBC WITH DIFFERENTIAL/PLATELET
Abs Immature Granulocytes: 0.03 K/uL (ref 0.00–0.07)
Basophils Absolute: 0 K/uL (ref 0.0–0.1)
Basophils Relative: 1 %
Eosinophils Absolute: 0.1 K/uL (ref 0.0–0.5)
Eosinophils Relative: 1 %
HCT: 42.4 % (ref 36.0–46.0)
Hemoglobin: 13.4 g/dL (ref 12.0–15.0)
Immature Granulocytes: 0 %
Lymphocytes Relative: 19 %
Lymphs Abs: 1.4 K/uL (ref 0.7–4.0)
MCH: 29.3 pg (ref 26.0–34.0)
MCHC: 31.6 g/dL (ref 30.0–36.0)
MCV: 92.8 fL (ref 80.0–100.0)
Monocytes Absolute: 0.5 K/uL (ref 0.1–1.0)
Monocytes Relative: 7 %
Neutro Abs: 5.5 K/uL (ref 1.7–7.7)
Neutrophils Relative %: 72 %
Platelets: 232 K/uL (ref 150–400)
RBC: 4.57 MIL/uL (ref 3.87–5.11)
RDW: 15.7 % — ABNORMAL HIGH (ref 11.5–15.5)
WBC: 7.6 K/uL (ref 4.0–10.5)
nRBC: 0 % (ref 0.0–0.2)

## 2023-09-28 LAB — COMPREHENSIVE METABOLIC PANEL WITH GFR
ALT: 22 U/L (ref 0–44)
AST: 25 U/L (ref 15–41)
Albumin: 3.6 g/dL (ref 3.5–5.0)
Alkaline Phosphatase: 69 U/L (ref 38–126)
Anion gap: 15 (ref 5–15)
BUN: 19 mg/dL (ref 8–23)
CO2: 24 mmol/L (ref 22–32)
Calcium: 9 mg/dL (ref 8.9–10.3)
Chloride: 99 mmol/L (ref 98–111)
Creatinine, Ser: 0.79 mg/dL (ref 0.44–1.00)
GFR, Estimated: >60 mL/min (ref >60)
Glucose, Bld: 252 mg/dL — ABNORMAL HIGH (ref 70–99)
Potassium: 3.9 mmol/L (ref 3.5–5.1)
Sodium: 138 mmol/L (ref 135–145)
Total Bilirubin: 0.7 mg/dL (ref 0.0–1.2)
Total Protein: 7 g/dL (ref 6.5–8.1)

## 2023-09-28 LAB — TROPONIN I (HIGH SENSITIVITY)
Troponin I (High Sensitivity): 7 ng/L (ref <18)
Troponin I (High Sensitivity): 7 ng/L (ref <18)

## 2023-09-28 LAB — CBG MONITORING, ED: Glucose-Capillary: 251 mg/dL — ABNORMAL HIGH (ref 70–99)

## 2023-09-28 MED ORDER — SODIUM CHLORIDE 0.9 % IV SOLN: INTRAVENOUS | Status: DC

## 2023-09-28 NOTE — ED Triage Notes (Signed)
 Pt states yesterday she had an episode of dizziness and she sat down, the next thing she remembers is waking up on the floor with a headache, sob and diaphoretic; pt states she checked her cbg and it was 153  Pt states she woke up at 2am last night with pain under left breast and left shoulder with sob

## 2023-09-28 NOTE — Discharge Instructions (Signed)
 As discussed, your evaluation today has been largely reassuring.  But, it is important that you monitor your condition carefully, and do not hesitate to return to the ED if you develop new, or concerning changes in your condition. ? ?Otherwise, please follow-up with your physician for appropriate ongoing care. ? ?

## 2023-09-28 NOTE — ED Provider Notes (Signed)
 [place] EMERGENCY DEPARTMENT AT [facility] Provider Note   CSN: [id number] Arrival date & time: 09/28/23  9044     Patient presents with: [place]   [name] is a 68 y.o. female.   HPI Female presents after episode of syncope with pain throughout her left hemithorax. Yesterday evening, she notes that she felt lightheaded, sat down, found herself on the ground.  Since that time she has had worsening pain throughout her left hemithorax.  Pain radiates to the left arm, but is worse with arm motion, breathing, coughing, motion. No additional syncope, no anterior chest pain before or after the event. She notes this is her second episode of syncope in the past month.    Prior to Admission medications   Medication Sig Start Date End Date Taking? Authorizing Provider  acetaminophen  (TYLENOL ) 325 MG tablet Take 650 mg by mouth every 6 (six) hours as needed for moderate pain (pain score 4-6).   Yes [provider]  albuterol  (VENTOLIN  HFA) 108 (90 Base) MCG/ACT inhaler TAKE 2 PUFFS BY MOUTH EVERY 6 HOURS AS NEEDED FOR WHEEZE OR SHORTNESS OF BREATH 05/30/23  Yes Melvenia Manus BRAVO, MD  aspirin  EC 81 MG tablet Take 81 mg by mouth daily.   Yes [provider]  atorvastatin  (LIPITOR ) 40 MG tablet TAKE 1 TABLET (40 MG TOTAL) BY MOUTH EVERY OTHER DAY. TAKE AT [facility] 07/18/23  Yes [name], MD  budesonide -formoterol  (BREYNA ) 80-4.5 MCG/ACT inhaler Inhale 2 puffs into the lungs 2 (two) times daily.   Yes [provider]  escitalopram  (LEXAPRO ) 20 MG tablet TAKE 1 TABLET (20 MG TOTAL) BY MOUTH DAILY AT 6 (SIX) AM. 07/22/23 01/18/24 Yes [name], MD  FARXIGA  10 MG TABS tablet TAKE 1 TABLET BY MOUTH EVERY DAY 09/22/23  Yes [name], MD  fluticasone  (FLONASE ) 50 MCG/ACT nasal spray Place 1 spray into both nostrils daily. 10/24/20  Yes Lamptey, Aleene KIDD, MD  furosemide  (LASIX ) 20 MG tablet TAKE 1 TABLET BY MOUTH EVERY DAY 10/26/22  Yes Melvenia Manus BRAVO,  MD  isosorbide  mononitrate (IMDUR ) 30 MG 24 hr tablet TAKE 1 TABLET BY MOUTH EVERY DAY 06/21/23  Yes Debera Jayson MATSU, MD  metFORMIN  (GLUCOPHAGE ) 1000 MG tablet Take 1 tablet (1,000 mg total) by mouth 2 (two) times daily. 08/10/23  Yes [name], MD  metoprolol  succinate (TOPROL -XL) 25 MG 24 hr tablet TAKE 1.5 TABLETS BY MOUTH DAILY Patient taking differently: Take 25 mg by mouth See admin instructions. Take 1 tablet in the morning and 1/2 tablet every evening 01/24/23  Yes Debera Jayson MATSU, MD  nystatin  cream (MYCOSTATIN ) Apply 1 Application topically 2 (two) times daily. 10/01/22  Yes Antonetta Rollene BRAVO, MD  valsartan  (DIOVAN ) 40 MG tablet Take 1 tablet (40 mg total) by mouth daily. 02/09/23  Yes Darlean Ozell NOVAK, MD  Continuous Glucose Sensor (FREESTYLE LIBRE 3 PLUS SENSOR) MISC Change sensor every 15 days. 08/10/23   [name], MD    Allergies: Codeine, Morphine, and Sulfa antibiotics    Review of Systems  Updated Vital Signs BP 137/67   Pulse 74   Temp 98.3 F (36.8 C) (Oral)   Resp (!) 22   Ht 1.626 m (5' 4)   Wt 90.7 kg   SpO2 94%   BMI 34.33 kg/m   Physical Exam Vitals and nursing note reviewed.  Constitutional:      Appearance: She is well-developed. She is obese.  HENT:  Head: Normocephalic and atraumatic.  Eyes:     Conjunctiva/sclera: Conjunctivae normal.  Cardiovascular:     Rate and Rhythm: Normal rate and regular rhythm.  Pulmonary:     Effort: Pulmonary effort is normal. No respiratory distress.     Breath sounds: Normal breath sounds. No stridor.  Abdominal:     General: There is no distension.  Musculoskeletal:     Comments: No obvious deformity left shoulder, left elbow.  Skin:    General: Skin is warm and dry.     Comments: Ecchymosis left  Neurological:     Mental Status: She is alert and oriented to person, place, and time.     Cranial Nerves: No cranial nerve deficit.  Psychiatric:        Mood and Affect: Mood normal.     (all  labs ordered are listed, but only abnormal results are displayed) Labs Reviewed  COMPREHENSIVE METABOLIC PANEL WITH GFR - Abnormal; Notable for the following components:      Result Value   Glucose, Bld 252 (*)    All other components within normal limits  CBC WITH DIFFERENTIAL/PLATELET - Abnormal; Notable for the following components:   RDW 15.7 (*)    All other components within normal limits  CBG MONITORING, ED - Abnormal; Notable for the following components:   Glucose-Capillary 251 (*)    All other components within normal limits  TROPONIN I (HIGH SENSITIVITY)  TROPONIN I (HIGH SENSITIVITY)    EKG: EKG Interpretation Date/Time:  Wednesday September 28 2023 10:16:38 EDT Ventricular Rate:  74 PR Interval:  126 QRS Duration:  87 QT Interval:  419 QTC Calculation: 465 R Axis:   120  Text Interpretation: Sinus rhythm Probable RVH w/ secondary repol abnormality Nonspecific T abnormalities, lateral leads Confirmed by Garrick Charleston [id number]) on 09/28/2023 10:23:13 AM  Radiology: CT Chest Wo Contrast Result Date: 09/28/2023 CLINICAL DATA:  Blunt chest Trauma after fall. Dizziness and diaphoresis with shortness of breath. EXAM: CT CHEST WITHOUT CONTRAST TECHNIQUE: Multidetector CT imaging of the chest was performed following the standard protocol without IV contrast. RADIATION DOSE REDUCTION: This exam was performed according to the departmental dose-optimization program which includes automated exposure control, adjustment of the mA and/or kV according to patient size and/or use of iterative reconstruction technique. COMPARISON:  02/03/2023 FINDINGS: Cardiovascular: Coronary, aortic arch, and branch vessel atherosclerotic vascular disease. Mild cardiomegaly. Mild calcification of the aortic valve. Mediastinum/Nodes: Sub-centimeter in short axis prevascular, AP window, left supraclavicular, and paratracheal lymph nodes are identified. Index right paratracheal node 0.8 cm in short axis on image 49  series 2, formerly 0.6 cm on 02/03/2023. Although not pathologically enlarged these could be reactive. Continued prominence of the main pulmonary artery favoring pulmonary arterial hypertension. Lungs/Pleura: Old granulomatous disease noted. Stable accentuated aeration in a cluster of secondary pulmonary lobules laterally in the left upper lobe is unchanged from 02/03/2023. Stable 5 by 3 mm left lower lobe nodule on image 86 series 3. Stable 4 by 3 mm left upper lobe nodule on image 59 series 3. Upper Abdomen: Unremarkable Musculoskeletal: Lower cervical plate and screw fixator. Thoracic spondylosis. IMPRESSION: 1. No acute findings. 2. Stable (from 11/03/2022) small left lung nodules under 5 mm in average diameter. No further imaging workup of these lesions is indicated. 3. Continued prominence of the main pulmonary artery favoring pulmonary arterial hypertension. 4. Mild cardiomegaly. 5. Thoracic spondylosis. 6.  Aortic Atherosclerosis (ICD10-I70.0). Electronically Signed   By: Ryan Salvage M.D.   On: 09/28/2023 12:45  DG Ribs Unilateral W/Chest Left Result Date: 09/28/2023 CLINICAL DATA:  fall - thoracic pain EXAM: LEFT RIBS AND CHEST - 3+ VIEW COMPARISON:  None available FINDINGS: No focal airspace consolidation, pleural effusion, or pneumothorax. No cardiomegaly. No acute, displaced rib fracture or destructive lesion. IMPRESSION: No acute, displaced rib fracture. Otherwise, clear lungs. Electronically Signed   By: Rogelia Myers M.D.   On: 09/28/2023 11:18     Procedures   Medications Ordered in the ED  0.9 %  sodium chloride  infusion ( Intravenous Infusion Verify 09/28/23 1145)                                    Medical Decision Making Adult female presents with thoracic pain following syncope.  Evaluation for syncope as well as posttraumatic effects including rib fracture, pulmonary contusion included labs ECG monitoring fluids.   Amount and/or Complexity of Data Reviewed Labs: ordered.  Decision-making details documented in ED Course. Radiology: ordered and independent interpretation performed. Decision-making details documented in ED Course. ECG/medicine tests: ordered and independent interpretation performed. Decision-making details documented in ED Course.  Risk Prescription drug management. Decision regarding hospitalization. Diagnosis or treatment significantly limited by social determinants of health.  With concern for occult rib fracture CT scan ordered 1:59 PM Patient awake, alert, sitting on the edge of her bed.  We discussed all findings, reassuring CT, x-ray, labs patient has mild tachypnea, but no evidence for pneumonia, pulmonary contusion, normal pneumothorax on CT.  Discussed admission for monitoring given the episode, questionable syncope.  Patient has follow-up scheduled with cardiology in the coming days, states that she is comfortable with discharge, to follow-up as an outpatient.  We discussed pain medication regimen and the patient discharged in stable condition.     Final diagnoses:  Fall, initial encounter  Contusion of rib on left side, initial encounter    ED Discharge Orders     None          Garrick Charleston, MD 09/28/23 1359

## 2023-10-05 ENCOUNTER — Ambulatory Visit (INDEPENDENT_AMBULATORY_CARE_PROVIDER_SITE_OTHER)

## 2023-10-05 ENCOUNTER — Encounter: Payer: Self-pay | Admitting: Cardiology

## 2023-10-05 ENCOUNTER — Ambulatory Visit: Attending: Cardiology | Admitting: Cardiology

## 2023-10-05 VITALS — BP 158/78 | HR 75 | Ht 64.0 in | Wt 207.0 lb

## 2023-10-05 DIAGNOSIS — I471 Supraventricular tachycardia, unspecified: Secondary | ICD-10-CM

## 2023-10-05 DIAGNOSIS — I350 Nonrheumatic aortic (valve) stenosis: Secondary | ICD-10-CM

## 2023-10-05 DIAGNOSIS — Z87898 Personal history of other specified conditions: Secondary | ICD-10-CM

## 2023-10-05 DIAGNOSIS — I25119 Atherosclerotic heart disease of native coronary artery with unspecified angina pectoris: Secondary | ICD-10-CM

## 2023-10-05 DIAGNOSIS — I272 Pulmonary hypertension, unspecified: Secondary | ICD-10-CM | POA: Diagnosis not present

## 2023-10-05 MED ORDER — VALSARTAN 40 MG PO TABS: 20.0000 mg | ORAL_TABLET | Freq: Every day | ORAL | 3 refills | Status: DC

## 2023-10-05 NOTE — Progress Notes (Signed)
 Cardiology Office Note  Date: 10/05/2023   ID: Orian, Amberg 1955-08-15, MRN [id number]  History of Present Illness: [name] is a 68 y.o. female last seen in August 2024.  She is here for a follow-up visit.  She was seen in the ER on September 3 for evaluation of an episode of syncope.  No obvious arrhythmias documented, ECG noted below.  High-sensitivity troponin I levels were normal.  Chest CT revealed no acute findings with description of mild cardiomegaly and mildly calcified aortic valve, also prominent main pulmonary artery.  We discussed her symptoms.  She states that she has had 3 episodes of syncope since April.  These are sudden in onset with no obvious precipitant.  Recent episode occurred when she had gotten back from work, walking through her living room on the way to the bathroom, suddenly felt lightheaded and nauseated, sat down on a bench, and then found herself on the floor having fallen.  She does not specifically recall any chest pain or palpitations.  Echocardiogram in October 2023 revealed LVEF 55 to 60% with elevated RVSP estimated 67 mmHg, mild aortic stenosis with mean AV gradient 8.5 mmHg and dimensionless index 0.56.  She tells me that she has been diagnosed with mild asthma by Dr. Darlean.  Medications reviewed.  She stopped taking Diovan  40 mg since last week to see if it made any difference in how she felt, blood pressure has trended up.  Not entirely clear that she has had frank hypotension in association with symptoms.  Orthostatics were obtained today and negative.  Physical Exam: VS:  BP (!) 158/78 (BP Location: Right Arm, Patient Position: Sitting, Cuff Size: Large)   Pulse 75   Ht 5' 4 (1.626 m)   Wt 207 lb (93.9 kg)   SpO2 92%   BMI 35.53 kg/m , BMI Body mass index is 35.53 kg/m.  Wt Readings from Last 3 Encounters:  10/05/23 207 lb (93.9 kg)  09/28/23 200 lb (90.7 kg)  08/10/23 204 lb (92.5 kg)    General: Patient appears  comfortable at rest. HEENT: Conjunctiva and lids normal. Neck: Supple, no elevated JVP or carotid bruits. Lungs: Clear to auscultation, nonlabored breathing at rest. Cardiac: Regular rate and rhythm, no S3, 2/6 systolic murmur. Extremities: No pitting edema.  ECG:  An ECG dated 09/28/2023 was personally reviewed today and demonstrated:  Sinus rhythm with nonspecific ST-T changes.  Labwork: 03/13/2023: Magnesium  1.5; TSH 1.805 09/28/2023: ALT 22; AST 25; BUN 19; Creatinine, Ser 0.79; Hemoglobin 13.4; Platelets 232; Potassium 3.9; Sodium 138     Component Value Date/Time   CHOL 114 08/10/2023 1540   TRIG 105 08/10/2023 1540   HDL 51 08/10/2023 1540   CHOLHDL 2.2 08/10/2023 1540   CHOLHDL 2.8 11/07/2015 1037   VLDL 31 (H) 11/07/2015 1037   LDLCALC 44 08/10/2023 1540   Other Studies Reviewed Today:  No interval cardiac testing for review today.  Assessment and Plan:  1.  Syncope as discussed above.  Etiology not entirely certain, relatively sudden onset suggest potential arrhythmia.  She was not found to be orthostatic today.  Plan to obtain 14-day Zio patch and a follow-up echocardiogram.  2.  CAD status post prior anterior STEMI and DES to the LAD in 2018.  She reports no angina with current level of activity.  Cardiac enzymes negative at recent ER visit.  Continue aspirin  81 mg daily, Lipitor  40 mg every other day, and Imdur  30 mg daily.   3.  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  Will reassess arrhythmia burden as discussed above.  Currently on Toprol -XL 25 mg in the morning and 12.5 mg in the evening.   4.  Mild calcific aortic stenosis.  Mean AV gradient 8.5 mmHg by echocardiogram in October 2023.  She is asymptomatic.  This will be reevaluated by echocardiography.   5.  Mixed hyperlipidemia, LDL 44 in July.  Continue Lipitor  40 mg daily.  6.  Pulmonary hypertension, RVSP estimated 67 mmHg by echocardiogram in 2023.  Main pulmonary artery prominent  by CT imaging.  She has been diagnosed with mild asthma/COPD, no known history of thromboembolic disease.  Plan to reevaluate by echocardiography, may need additional workup as well.  7.  Primary hypertension.  With plan to resume Diovan  at 20 mg daily for now.  Disposition:  Follow up test results.  Signed, Jayson JUDITHANN Sierras, M.D., F.A.C.C.  [facility]

## 2023-10-05 NOTE — Patient Instructions (Signed)
 Medication Instructions:   Your physician recommends that you continue on your current medications as directed. Please refer to the Current Medication list given to you today.   Labwork: None today  Testing/Procedures: Your physician has requested that you have an echocardiogram. Echocardiography is a painless test that uses sound waves to create images of your heart. It provides your doctor with information about the size and shape of your heart and how well your heart's chambers and valves are working. This procedure takes approximately one hour. There are no restrictions for this procedure. Please do NOT wear cologne, perfume, aftershave, or lotions (deodorant is allowed). Please arrive 15 minutes prior to your appointment time.  Please note: We ask at that you not bring children with you during ultrasound (echo/ vascular) testing. Due to room size and safety concerns, children are not allowed in the ultrasound rooms during exams. Our front office staff cannot provide observation of children in our lobby area while testing is being conducted. An adult accompanying a patient to their appointment will only be allowed in the ultrasound room at the discretion of the ultrasound technician under special circumstances. We apologize for any inconvenience.     ZIO XT- Long Term Monitor Instructions   Your physician has requested you wear your ZIO patch monitor____14___days.   This is a single patch monitor.  [employer] supplies one patch monitor per enrollment.  Additional stickers are not available.   Please do not apply patch if you will be having a Nuclear Stress Test, Echocardiogram, Cardiac CT, MRI, or Chest Xray during the time frame you would be wearing the monitor. The patch cannot be worn during these tests.  You cannot remove and re-apply the ZIO XT patch monitor.       Do not shower for the first 24 hours.  You may shower after the first 24 hours.   Press button if you feel a symptom.  You will hear a small click.  Record Date, Time and Symptom in the Patient Log Book.   When you are ready to remove patch, follow instructions on last 2 pages of Patient Log Book.  Stick patch monitor onto last page of Patient Log Book.   Place Patient Log Book in [place] box.  Use locking tab on box and tape box closed securely.  The [employer] and [employer] has [employer] on it.  Please place in mailbox as soon as possible.  Your physician should have your test results approximately 7 days after the monitor has been mailed back to [facility]   Call [facility] Customer Care at [phone] if you have questions regarding your ZIO XT patch monitor.  Call them immediately if you see an orange light blinking on your monitor.   If your monitor falls off in less than 4 days contact our Monitor department at [phone].  If your monitor becomes loose or falls off after 4 days call [employer] at [phone] for suggestions on securing your monitor.    Follow-Up: To be determined after testing  Any Other Special Instructions Will Be Listed Below (If Applicable).  If you need a refill on your cardiac medications before your next appointment, please call your pharmacy.

## 2023-10-05 NOTE — Addendum Note (Signed)
 Addended by: Mishel Sans A on: 10/05/2023 08:57 AM   Modules accepted: Orders

## 2023-10-14 DIAGNOSIS — J9601 Acute respiratory failure with hypoxia: Secondary | ICD-10-CM | POA: Diagnosis not present

## 2023-10-14 DIAGNOSIS — J449 Chronic obstructive pulmonary disease, unspecified: Secondary | ICD-10-CM | POA: Diagnosis not present

## 2023-10-18 ENCOUNTER — Emergency Department (HOSPITAL_COMMUNITY)

## 2023-10-18 ENCOUNTER — Encounter: Payer: Self-pay | Admitting: Internal Medicine

## 2023-10-18 ENCOUNTER — Ambulatory Visit (INDEPENDENT_AMBULATORY_CARE_PROVIDER_SITE_OTHER): Admitting: Internal Medicine

## 2023-10-18 ENCOUNTER — Other Ambulatory Visit: Payer: Self-pay

## 2023-10-18 ENCOUNTER — Inpatient Hospital Stay (HOSPITAL_COMMUNITY)
Admission: EM | Admit: 2023-10-18 | Discharge: 2023-10-24 | DRG: 286 | Disposition: A | Discharge status: Home / Self Care | Source: Ambulatory Visit | Attending: Internal Medicine | Admitting: Internal Medicine

## 2023-10-18 ENCOUNTER — Encounter (HOSPITAL_COMMUNITY): Payer: Self-pay

## 2023-10-18 VITALS — BP 134/68 | HR 70 | Temp 97.4°F | Ht 64.0 in | Wt 213.6 lb

## 2023-10-18 DIAGNOSIS — Z882 Allergy status to sulfonamides status: Secondary | ICD-10-CM

## 2023-10-18 DIAGNOSIS — I110 Hypertensive heart disease with heart failure: Principal | ICD-10-CM | POA: Diagnosis present

## 2023-10-18 DIAGNOSIS — I252 Old myocardial infarction: Secondary | ICD-10-CM

## 2023-10-18 DIAGNOSIS — E876 Hypokalemia: Secondary | ICD-10-CM | POA: Diagnosis not present

## 2023-10-18 DIAGNOSIS — Z7901 Long term (current) use of anticoagulants: Secondary | ICD-10-CM

## 2023-10-18 DIAGNOSIS — Z8249 Family history of ischemic heart disease and other diseases of the circulatory system: Secondary | ICD-10-CM

## 2023-10-18 DIAGNOSIS — I272 Pulmonary hypertension, unspecified: Secondary | ICD-10-CM

## 2023-10-18 DIAGNOSIS — K219 Gastro-esophageal reflux disease without esophagitis: Secondary | ICD-10-CM | POA: Diagnosis not present

## 2023-10-18 DIAGNOSIS — Z1152 Encounter for screening for COVID-19: Secondary | ICD-10-CM

## 2023-10-18 DIAGNOSIS — R55 Syncope and collapse: Secondary | ICD-10-CM | POA: Diagnosis present

## 2023-10-18 DIAGNOSIS — J449 Chronic obstructive pulmonary disease, unspecified: Secondary | ICD-10-CM | POA: Diagnosis present

## 2023-10-18 DIAGNOSIS — I2721 Secondary pulmonary arterial hypertension: Secondary | ICD-10-CM | POA: Diagnosis present

## 2023-10-18 DIAGNOSIS — I509 Heart failure, unspecified: Principal | ICD-10-CM

## 2023-10-18 DIAGNOSIS — E66811 Obesity, class 1: Secondary | ICD-10-CM | POA: Diagnosis not present

## 2023-10-18 DIAGNOSIS — R0609 Other forms of dyspnea: Secondary | ICD-10-CM | POA: Diagnosis present

## 2023-10-18 DIAGNOSIS — J4489 Other specified chronic obstructive pulmonary disease: Secondary | ICD-10-CM | POA: Diagnosis not present

## 2023-10-18 DIAGNOSIS — Z713 Dietary counseling and surveillance: Secondary | ICD-10-CM

## 2023-10-18 DIAGNOSIS — G4733 Obstructive sleep apnea (adult) (pediatric): Secondary | ICD-10-CM | POA: Diagnosis present

## 2023-10-18 DIAGNOSIS — I2724 Chronic thromboembolic pulmonary hypertension: Secondary | ICD-10-CM | POA: Diagnosis present

## 2023-10-18 DIAGNOSIS — Z961 Presence of intraocular lens: Secondary | ICD-10-CM | POA: Diagnosis present

## 2023-10-18 DIAGNOSIS — E782 Mixed hyperlipidemia: Secondary | ICD-10-CM | POA: Diagnosis not present

## 2023-10-18 DIAGNOSIS — F17210 Nicotine dependence, cigarettes, uncomplicated: Secondary | ICD-10-CM | POA: Diagnosis present

## 2023-10-18 DIAGNOSIS — I2782 Chronic pulmonary embolism: Secondary | ICD-10-CM | POA: Diagnosis present

## 2023-10-18 DIAGNOSIS — Z7951 Long term (current) use of inhaled steroids: Secondary | ICD-10-CM

## 2023-10-18 DIAGNOSIS — R0600 Dyspnea, unspecified: Secondary | ICD-10-CM | POA: Diagnosis not present

## 2023-10-18 DIAGNOSIS — Z79899 Other long term (current) drug therapy: Secondary | ICD-10-CM

## 2023-10-18 DIAGNOSIS — J9621 Acute and chronic respiratory failure with hypoxia: Secondary | ICD-10-CM | POA: Diagnosis not present

## 2023-10-18 DIAGNOSIS — I10 Essential (primary) hypertension: Secondary | ICD-10-CM | POA: Diagnosis present

## 2023-10-18 DIAGNOSIS — I350 Nonrheumatic aortic (valve) stenosis: Secondary | ICD-10-CM | POA: Diagnosis not present

## 2023-10-18 DIAGNOSIS — Z90710 Acquired absence of both cervix and uterus: Secondary | ICD-10-CM

## 2023-10-18 DIAGNOSIS — F32A Depression, unspecified: Secondary | ICD-10-CM | POA: Diagnosis present

## 2023-10-18 DIAGNOSIS — J9601 Acute respiratory failure with hypoxia: Secondary | ICD-10-CM | POA: Diagnosis present

## 2023-10-18 DIAGNOSIS — I082 Rheumatic disorders of both aortic and tricuspid valves: Secondary | ICD-10-CM | POA: Diagnosis not present

## 2023-10-18 DIAGNOSIS — F419 Anxiety disorder, unspecified: Secondary | ICD-10-CM | POA: Diagnosis present

## 2023-10-18 DIAGNOSIS — R001 Bradycardia, unspecified: Secondary | ICD-10-CM | POA: Diagnosis not present

## 2023-10-18 DIAGNOSIS — Z955 Presence of coronary angioplasty implant and graft: Secondary | ICD-10-CM

## 2023-10-18 DIAGNOSIS — I5033 Acute on chronic diastolic (congestive) heart failure: Secondary | ICD-10-CM | POA: Diagnosis not present

## 2023-10-18 DIAGNOSIS — I6529 Occlusion and stenosis of unspecified carotid artery: Secondary | ICD-10-CM | POA: Diagnosis present

## 2023-10-18 DIAGNOSIS — Z7982 Long term (current) use of aspirin: Secondary | ICD-10-CM

## 2023-10-18 DIAGNOSIS — I6523 Occlusion and stenosis of bilateral carotid arteries: Secondary | ICD-10-CM | POA: Diagnosis not present

## 2023-10-18 DIAGNOSIS — I2510 Atherosclerotic heart disease of native coronary artery without angina pectoris: Secondary | ICD-10-CM | POA: Diagnosis not present

## 2023-10-18 DIAGNOSIS — Z9049 Acquired absence of other specified parts of digestive tract: Secondary | ICD-10-CM

## 2023-10-18 DIAGNOSIS — Z981 Arthrodesis status: Secondary | ICD-10-CM

## 2023-10-18 DIAGNOSIS — Z6836 Body mass index (BMI) 36.0-36.9, adult: Secondary | ICD-10-CM

## 2023-10-18 DIAGNOSIS — I5031 Acute diastolic (congestive) heart failure: Secondary | ICD-10-CM | POA: Diagnosis not present

## 2023-10-18 DIAGNOSIS — Z7984 Long term (current) use of oral hypoglycemic drugs: Secondary | ICD-10-CM

## 2023-10-18 DIAGNOSIS — I50813 Acute on chronic right heart failure: Secondary | ICD-10-CM | POA: Diagnosis not present

## 2023-10-18 DIAGNOSIS — R918 Other nonspecific abnormal finding of lung field: Secondary | ICD-10-CM | POA: Diagnosis not present

## 2023-10-18 DIAGNOSIS — E1165 Type 2 diabetes mellitus with hyperglycemia: Secondary | ICD-10-CM | POA: Diagnosis not present

## 2023-10-18 DIAGNOSIS — Z885 Allergy status to narcotic agent status: Secondary | ICD-10-CM

## 2023-10-18 DIAGNOSIS — I25119 Atherosclerotic heart disease of native coronary artery with unspecified angina pectoris: Secondary | ICD-10-CM | POA: Diagnosis not present

## 2023-10-18 DIAGNOSIS — I288 Other diseases of pulmonary vessels: Secondary | ICD-10-CM | POA: Diagnosis not present

## 2023-10-18 LAB — CBC WITH DIFFERENTIAL/PLATELET
Abs Immature Granulocytes: 0.03 K/uL (ref 0.00–0.07)
Basophils Absolute: 0 K/uL (ref 0.0–0.1)
Basophils Relative: 1 %
Eosinophils Absolute: 0.1 K/uL (ref 0.0–0.5)
Eosinophils Relative: 1 %
HCT: 41.7 % (ref 36.0–46.0)
Hemoglobin: 13 g/dL (ref 12.0–15.0)
Immature Granulocytes: 1 %
Lymphocytes Relative: 21 %
Lymphs Abs: 1.4 K/uL (ref 0.7–4.0)
MCH: 29.2 pg (ref 26.0–34.0)
MCHC: 31.2 g/dL (ref 30.0–36.0)
MCV: 93.7 fL (ref 80.0–100.0)
Monocytes Absolute: 0.6 K/uL (ref 0.1–1.0)
Monocytes Relative: 9 %
Neutro Abs: 4.6 K/uL (ref 1.7–7.7)
Neutrophils Relative %: 67 %
Platelets: 228 K/uL (ref 150–400)
RBC: 4.45 MIL/uL (ref 3.87–5.11)
RDW: 15.9 % — ABNORMAL HIGH (ref 11.5–15.5)
WBC: 6.6 K/uL (ref 4.0–10.5)
nRBC: 0 % (ref 0.0–0.2)

## 2023-10-18 LAB — COMPREHENSIVE METABOLIC PANEL WITH GFR
ALT: 25 U/L (ref 0–44)
AST: 26 U/L (ref 15–41)
Albumin: 3.3 g/dL — ABNORMAL LOW (ref 3.5–5.0)
Alkaline Phosphatase: 85 U/L (ref 38–126)
Anion gap: 14 (ref 5–15)
BUN: 22 mg/dL (ref 8–23)
CO2: 25 mmol/L (ref 22–32)
Calcium: 8.8 mg/dL — ABNORMAL LOW (ref 8.9–10.3)
Chloride: 101 mmol/L (ref 98–111)
Creatinine, Ser: 0.86 mg/dL (ref 0.44–1.00)
GFR, Estimated: >60 mL/min (ref >60)
Glucose, Bld: 163 mg/dL — ABNORMAL HIGH (ref 70–99)
Potassium: 4 mmol/L (ref 3.5–5.1)
Sodium: 140 mmol/L (ref 135–145)
Total Bilirubin: 0.5 mg/dL (ref 0.0–1.2)
Total Protein: 6.9 g/dL (ref 6.5–8.1)

## 2023-10-18 LAB — BLOOD GAS, VENOUS
Acid-Base Excess: 3.7 mmol/L — ABNORMAL HIGH (ref 0.0–2.0)
Bicarbonate: 28.5 mmol/L — ABNORMAL HIGH (ref 20.0–28.0)
Drawn by: 44828
O2 Saturation: 81.2 %
Patient temperature: 36.9
pCO2, Ven: 43 mmHg — ABNORMAL LOW (ref 44–60)
pH, Ven: 7.43 (ref 7.25–7.43)
pO2, Ven: 49 mmHg — ABNORMAL HIGH (ref 32–45)

## 2023-10-18 LAB — PHOSPHORUS: Phosphorus: 4.2 mg/dL (ref 2.5–4.6)

## 2023-10-18 LAB — LIPID PANEL
Cholesterol: 81 mg/dL (ref 0–200)
HDL: 32 mg/dL — ABNORMAL LOW (ref >40)
LDL Cholesterol: 29 mg/dL (ref 0–99)
Total CHOL/HDL Ratio: 2.5 ratio
Triglycerides: 100 mg/dL (ref <150)
VLDL: 20 mg/dL (ref 0–40)

## 2023-10-18 LAB — TROPONIN I (HIGH SENSITIVITY)
Troponin I (High Sensitivity): 7 ng/L (ref <18)
Troponin I (High Sensitivity): 7 ng/L (ref <18)

## 2023-10-18 LAB — GLUCOSE, CAPILLARY
Glucose-Capillary: 109 mg/dL — ABNORMAL HIGH (ref 70–99)
Glucose-Capillary: 276 mg/dL — ABNORMAL HIGH (ref 70–99)

## 2023-10-18 LAB — MAGNESIUM
Magnesium: 1.3 mg/dL — ABNORMAL LOW (ref 1.7–2.4)
Magnesium: 1.7 mg/dL (ref 1.7–2.4)

## 2023-10-18 LAB — RESP PANEL BY RT-PCR (RSV, FLU A&B, COVID)  RVPGX2
Influenza A by PCR: NEGATIVE
Influenza B by PCR: NEGATIVE
Resp Syncytial Virus by PCR: NEGATIVE
SARS Coronavirus 2 by RT PCR: NEGATIVE

## 2023-10-18 LAB — D-DIMER, QUANTITATIVE: D-Dimer, Quant: 3.12 ug{FEU}/mL — ABNORMAL HIGH (ref 0.00–0.50)

## 2023-10-18 LAB — BRAIN NATRIURETIC PEPTIDE: B Natriuretic Peptide: 373 pg/mL — ABNORMAL HIGH (ref 0.0–100.0)

## 2023-10-18 MED ORDER — FUROSEMIDE 10 MG/ML IJ SOLN
40.0000 mg | Freq: Two times a day (BID) | INTRAMUSCULAR | Status: DC
  Administered 2023-10-18 – 2023-10-20 (×4): 40 mg via INTRAVENOUS
  Filled 2023-10-18 (×4): qty 4

## 2023-10-18 MED ORDER — IPRATROPIUM BROMIDE 0.02 % IN SOLN
0.5000 mg | Freq: Three times a day (TID) | RESPIRATORY_TRACT | Status: DC
  Administered 2023-10-19 – 2023-10-22 (×11): 0.5 mg via RESPIRATORY_TRACT
  Filled 2023-10-18 (×12): qty 2.5

## 2023-10-18 MED ORDER — METOPROLOL SUCCINATE ER 25 MG PO TB24
25.0000 mg | ORAL_TABLET | Freq: Every day | ORAL | Status: DC
  Administered 2023-10-18 – 2023-10-20 (×3): 25 mg via ORAL
  Filled 2023-10-18 (×3): qty 1

## 2023-10-18 MED ORDER — METHYLPREDNISOLONE SODIUM SUCC 125 MG IJ SOLR
80.0000 mg | Freq: Once | INTRAMUSCULAR | Status: AC
  Administered 2023-10-18: 80 mg via INTRAVENOUS
  Filled 2023-10-18: qty 2

## 2023-10-18 MED ORDER — DAPAGLIFLOZIN PROPANEDIOL 10 MG PO TABS
10.0000 mg | ORAL_TABLET | Freq: Every day | ORAL | Status: DC
  Administered 2023-10-19 – 2023-10-24 (×5): 10 mg via ORAL
  Filled 2023-10-18 (×6): qty 1

## 2023-10-18 MED ORDER — ESCITALOPRAM OXALATE 10 MG PO TABS
20.0000 mg | ORAL_TABLET | Freq: Every day | ORAL | Status: DC
Start: 2023-10-19 — End: 2023-10-24
  Administered 2023-10-19 – 2023-10-24 (×6): 20 mg via ORAL
  Filled 2023-10-18 (×6): qty 2

## 2023-10-18 MED ORDER — FLEET ENEMA RE ENEM: 1.0000 | ENEMA | Freq: Once | RECTAL | Status: DC | PRN

## 2023-10-18 MED ORDER — ONDANSETRON HCL 4 MG/2ML IJ SOLN: 4.0000 mg | Freq: Four times a day (QID) | INTRAMUSCULAR | Status: DC | PRN

## 2023-10-18 MED ORDER — ISOSORBIDE MONONITRATE ER 30 MG PO TB24
30.0000 mg | ORAL_TABLET | Freq: Every day | ORAL | Status: DC
Start: 2023-10-19 — End: 2023-10-20
  Administered 2023-10-19 – 2023-10-20 (×2): 30 mg via ORAL
  Filled 2023-10-18 (×2): qty 1

## 2023-10-18 MED ORDER — IPRATROPIUM BROMIDE 0.02 % IN SOLN: 0.5000 mg | Freq: Four times a day (QID) | RESPIRATORY_TRACT | Status: DC | PRN

## 2023-10-18 MED ORDER — IPRATROPIUM BROMIDE 0.02 % IN SOLN: 0.5000 mg | Freq: Three times a day (TID) | RESPIRATORY_TRACT | Status: DC

## 2023-10-18 MED ORDER — IPRATROPIUM BROMIDE 0.02 % IN SOLN: 0.5000 mg | Freq: Four times a day (QID) | RESPIRATORY_TRACT | Status: DC

## 2023-10-18 MED ORDER — MAGNESIUM SULFATE 2 GM/50ML IV SOLN
2.0000 g | Freq: Once | INTRAVENOUS | Status: AC
  Administered 2023-10-18: 2 g via INTRAVENOUS
  Filled 2023-10-18: qty 50

## 2023-10-18 MED ORDER — LEVALBUTEROL HCL 0.63 MG/3ML IN NEBU: 0.6300 mg | INHALATION_SOLUTION | Freq: Four times a day (QID) | RESPIRATORY_TRACT | Status: DC | PRN

## 2023-10-18 MED ORDER — SODIUM CHLORIDE 0.9% FLUSH
3.0000 mL | Freq: Two times a day (BID) | INTRAVENOUS | Status: DC
  Administered 2023-10-18 – 2023-10-24 (×7): 3 mL via INTRAVENOUS

## 2023-10-18 MED ORDER — SENNOSIDES-DOCUSATE SODIUM 8.6-50 MG PO TABS: 1.0000 | ORAL_TABLET | Freq: Every evening | ORAL | Status: DC | PRN

## 2023-10-18 MED ORDER — INSULIN ASPART 100 UNIT/ML IJ SOLN
0.0000 [IU] | Freq: Three times a day (TID) | INTRAMUSCULAR | Status: DC
  Administered 2023-10-19: 5 [IU] via SUBCUTANEOUS
  Administered 2023-10-19: 15 [IU] via SUBCUTANEOUS
  Administered 2023-10-19: 8 [IU] via SUBCUTANEOUS
  Administered 2023-10-20: 3 [IU] via SUBCUTANEOUS
  Administered 2023-10-20: 5 [IU] via SUBCUTANEOUS
  Administered 2023-10-21: 3 [IU] via SUBCUTANEOUS
  Administered 2023-10-21 (×2): 5 [IU] via SUBCUTANEOUS
  Administered 2023-10-22: 3 [IU] via SUBCUTANEOUS
  Administered 2023-10-22: 5 [IU] via SUBCUTANEOUS
  Administered 2023-10-22: 8 [IU] via SUBCUTANEOUS

## 2023-10-18 MED ORDER — LEVALBUTEROL HCL 0.63 MG/3ML IN NEBU
0.6300 mg | INHALATION_SOLUTION | Freq: Three times a day (TID) | RESPIRATORY_TRACT | Status: DC
  Administered 2023-10-18: 0.63 mg via RESPIRATORY_TRACT
  Filled 2023-10-18 (×2): qty 3

## 2023-10-18 MED ORDER — LEVALBUTEROL HCL 0.63 MG/3ML IN NEBU
0.6300 mg | INHALATION_SOLUTION | Freq: Three times a day (TID) | RESPIRATORY_TRACT | Status: DC
  Administered 2023-10-19 – 2023-10-22 (×11): 0.63 mg via RESPIRATORY_TRACT
  Filled 2023-10-18 (×12): qty 3

## 2023-10-18 MED ORDER — LEVALBUTEROL HCL 0.63 MG/3ML IN NEBU
0.6300 mg | INHALATION_SOLUTION | Freq: Three times a day (TID) | RESPIRATORY_TRACT | Status: DC
Start: 2023-10-18 — End: 2023-10-18

## 2023-10-18 MED ORDER — TRAZODONE HCL 50 MG PO TABS: 25.0000 mg | ORAL_TABLET | Freq: Every evening | ORAL | Status: DC | PRN

## 2023-10-18 MED ORDER — ASPIRIN 81 MG PO TBEC
81.0000 mg | DELAYED_RELEASE_TABLET | Freq: Every day | ORAL | Status: DC
Start: 2023-10-19 — End: 2023-10-22
  Administered 2023-10-19 – 2023-10-22 (×4): 81 mg via ORAL
  Filled 2023-10-18 (×5): qty 1

## 2023-10-18 MED ORDER — ACETAMINOPHEN 325 MG PO TABS
650.0000 mg | ORAL_TABLET | Freq: Four times a day (QID) | ORAL | Status: DC | PRN
  Administered 2023-10-19 – 2023-10-20 (×2): 650 mg via ORAL
  Filled 2023-10-18: qty 2

## 2023-10-18 MED ORDER — BISACODYL 5 MG PO TBEC: 5.0000 mg | DELAYED_RELEASE_TABLET | Freq: Every day | ORAL | Status: DC | PRN

## 2023-10-18 MED ORDER — METHYLPREDNISOLONE SODIUM SUCC 40 MG IJ SOLR: 40.0000 mg | Freq: Two times a day (BID) | INTRAMUSCULAR | Status: DC

## 2023-10-18 MED ORDER — HYDROMORPHONE HCL 1 MG/ML IJ SOLN: 0.5000 mg | INTRAMUSCULAR | Status: DC | PRN

## 2023-10-18 MED ORDER — IOHEXOL 350 MG/ML SOLN
75.0000 mL | Freq: Once | INTRAVENOUS | Status: AC | PRN
  Administered 2023-10-18: 75 mL via INTRAVENOUS

## 2023-10-18 MED ORDER — SODIUM CHLORIDE 0.9% FLUSH
3.0000 mL | Freq: Two times a day (BID) | INTRAVENOUS | Status: DC
  Administered 2023-10-18 – 2023-10-22 (×7): 3 mL via INTRAVENOUS

## 2023-10-18 MED ORDER — NYSTATIN 100000 UNIT/GM EX CREA: 1.0000 | TOPICAL_CREAM | Freq: Two times a day (BID) | CUTANEOUS | Status: DC | PRN

## 2023-10-18 MED ORDER — FUROSEMIDE 10 MG/ML IJ SOLN
40.0000 mg | Freq: Once | INTRAMUSCULAR | Status: AC
  Administered 2023-10-18: 40 mg via INTRAVENOUS
  Filled 2023-10-18: qty 4

## 2023-10-18 MED ORDER — OXYCODONE HCL 5 MG PO TABS: 5.0000 mg | ORAL_TABLET | ORAL | Status: DC | PRN

## 2023-10-18 MED ORDER — ACETAMINOPHEN 650 MG RE SUPP: 650.0000 mg | Freq: Four times a day (QID) | RECTAL | Status: DC | PRN

## 2023-10-18 MED ORDER — IPRATROPIUM BROMIDE 0.02 % IN SOLN
0.5000 mg | Freq: Three times a day (TID) | RESPIRATORY_TRACT | Status: DC
  Administered 2023-10-18: 0.5 mg via RESPIRATORY_TRACT
  Filled 2023-10-18 (×2): qty 2.5

## 2023-10-18 MED ORDER — ATORVASTATIN CALCIUM 40 MG PO TABS
40.0000 mg | ORAL_TABLET | ORAL | Status: DC
  Administered 2023-10-19 – 2023-10-23 (×3): 40 mg via ORAL
  Filled 2023-10-18 (×3): qty 1

## 2023-10-18 MED ORDER — LEVALBUTEROL HCL 0.63 MG/3ML IN NEBU: 0.6300 mg | INHALATION_SOLUTION | Freq: Four times a day (QID) | RESPIRATORY_TRACT | Status: DC

## 2023-10-18 MED ORDER — HEPARIN SODIUM (PORCINE) 5000 UNIT/ML IJ SOLN
5000.0000 [IU] | Freq: Three times a day (TID) | INTRAMUSCULAR | Status: DC
  Administered 2023-10-18 – 2023-10-20 (×2): 5000 [IU] via SUBCUTANEOUS
  Filled 2023-10-18 (×6): qty 1

## 2023-10-18 MED ORDER — HYDRALAZINE HCL 20 MG/ML IJ SOLN: 10.0000 mg | INTRAMUSCULAR | Status: DC | PRN

## 2023-10-18 MED ORDER — ONDANSETRON HCL 4 MG PO TABS: 4.0000 mg | ORAL_TABLET | Freq: Four times a day (QID) | ORAL | Status: DC | PRN

## 2023-10-18 NOTE — Assessment & Plan Note (Signed)
-   Aortic stenosis pulmonary hypertension -Maintain blood pressure check, diuresing -Follow-up with outpatient with cardiology and vascular

## 2023-10-18 NOTE — Assessment & Plan Note (Signed)
-   Reviewing and continue home medication; including aspirin , statins, beta-blocker

## 2023-10-18 NOTE — H&P (Signed)
 History and Physical   Patient: [name]                            PCP: [name], MD                    DOB: April 11, 1955            DOA: 10/18/2023 FMW:[id number]             DOS: 10/18/2023, 4:11 PM  [name], MD  Patient coming from:   HOME  I have personally reviewed patient's medical records, in electronic medical records, including:  [facility], and care everywhere.    Chief Complaint:   Chief Complaint  Patient presents with   Shortness of Breath    History of present illness:    [name] is a 68 year old female with history of anxiety, aortic stenosis, CAD non-STEMI s/p DES to dLAD, COPD, HTN, HLD, morbid obesity, ASO, DM II.... Presenting from pulmonology office for acute on chronic respiratory failure, increased nonproductive cough, congestion, shortness of breath, hypoxia, episodes of dizziness, near passing out spells while walking.  Shortness of breath with minimal exertion, symptoms progressively getting worse over past few days. Patient was seen by her pulmonologist subsequently sent to the ED for evaluation of shortness of breath and and ruling out PE.  ED evaluation: Patient was satting 88-89% with ambulation and 2 L of oxygen  Blood pressure (!) 126/56, pulse 70, temperature 98.4 F (36.9 C), temperature source Oral, resp. rate (!) 24, height 5' 4 (1.626 m), weight 96.9 kg, SpO2 94% 2 L of oxygen .  LABs: CBC, CMP reviewed within normal limits with exception of glucose 163, calcium  8.1, magnesium  1.3, D-dimer elevated 3.12.  Respiratory panel RSV, influenza A/B, COVID all negative VBG: pH 7.43, pCO2 43, PO249, bicarb 28.5 BNP 373  CTA chest: Negative for PE, 2. Main pulmonary artery dilation, likely reflecting underlying pulmonary arterial hypertension.  Requested for patient to admitted for acute on chronic respiratory failure possibly CHF exacerbation with COPD exacerbation    Patient Denies having: Fever, Chills, Chest Pain, Abd  pain, N/V/D, headache, dizziness, lightheadedness,  Dysuria, Joint pain, rash, open wounds     Review of Systems: As per HPI, otherwise 10 point review of systems were negative.   ----------------------------------------------------------------------------------------------------------------------  Allergies  Allergen Reactions   Codeine Nausea And Vomiting   Morphine Nausea And Vomiting   Sulfa Antibiotics Rash    Home MEDs:  Prior to Admission medications   Medication Sig Start Date End Date Taking? Authorizing Provider  aspirin  EC 81 MG tablet Take 81 mg by mouth daily.   Yes [provider]  atorvastatin  (LIPITOR ) 40 MG tablet TAKE 1 TABLET (40 MG TOTAL) BY MOUTH EVERY OTHER DAY. TAKE AT [facility] 07/18/23  Yes [name], MD  budesonide -formoterol  (BREYNA ) 80-4.5 MCG/ACT inhaler Inhale 2 puffs into the lungs 2 (two) times daily.   Yes [provider]  escitalopram  (LEXAPRO ) 20 MG tablet TAKE 1 TABLET (20 MG TOTAL) BY MOUTH DAILY AT 6 (SIX) AM. 07/22/23 01/18/24 Yes [name], MD  FARXIGA  10 MG TABS tablet TAKE 1 TABLET BY MOUTH EVERY DAY 09/22/23  Yes [name], MD  isosorbide  mononitrate (IMDUR ) 30 MG 24 hr tablet TAKE 1 TABLET BY MOUTH EVERY DAY 06/21/23  Yes Debera Jayson MATSU, MD  metoprolol  succinate (TOPROL -XL) 25 MG 24 hr tablet TAKE 1.5 TABLETS BY  MOUTH DAILY Patient taking differently: Take 25 mg by mouth See admin instructions. Take 1 tablet in the morning and 1/2 tablet every evening 01/24/23  Yes Debera Jayson MATSU, MD  nystatin  cream (MYCOSTATIN ) Apply 1 Application topically 2 (two) times daily. 10/01/22  Yes Antonetta Rollene BRAVO, MD  valsartan  (DIOVAN ) 40 MG tablet Take 0.5 tablets (20 mg total) by mouth daily. 10/05/23  Yes Debera Jayson MATSU, MD  Continuous Glucose Sensor (FREESTYLE LIBRE 3 PLUS SENSOR) MISC Change sensor every 15 days. 08/10/23   [name], MD    PRN MEDs: acetaminophen  **OR** acetaminophen , bisacodyl , hydrALAZINE ,  HYDROmorphone  (DILAUDID ) injection, nystatin  cream, ondansetron  **OR** ondansetron  (ZOFRAN ) IV, oxyCODONE , senna-docusate, traZODone   Past Medical History:  Diagnosis Date   Allergy    Codeine, Sulfur   Anxiety    Aortic stenosis    CAD (coronary artery disease)    a. 10/2014: Ant STEMI s/p DES to dLAD   Carotid disease, bilateral    a. Duplex 12/2014: mild-mod atherosclerotic plaque without hemodynamically significant stenosis.   Cataract 04/08/2016   CHF (congestive heart failure) (HCC) 08/27/2018   COPD (chronic obstructive pulmonary disease) (HCC)    Essential hypertension    Heart murmur 11/17/2014   HLD (hyperlipidemia)    Morbid obesity (HCC)    Myocardial infarction (HCC) 11/17/2014   Oxygen  deficiency 1996   1 LITER AT BEDTIME WITH BIPAP   PONV (postoperative nausea and vomiting)    Shingles    Sleep apnea    Uses CPAP   Type 2 diabetes mellitus [facility])     Past Surgical History:  Procedure Laterality Date   BREAST BIOPSY Left    2016   CARDIAC CATHETERIZATION N/A 11/17/2014   Procedure: Left Heart Cath and Coronary Angiography;  Surgeon: Deatrice DELENA Cage, MD;  Location: [facility] INVASIVE CV LAB;  Service: Cardiovascular;  Laterality: N/A;   CARDIAC CATHETERIZATION N/A 11/17/2014   Procedure: Coronary Stent Intervention;  Surgeon: Deatrice DELENA Cage, MD;  Location: [facility] INVASIVE CV LAB;  Service: Cardiovascular;  Laterality: N/A;  distal lad 2.25x20 promus   CATARACT EXTRACTION W/PHACO Left 09/20/2016   Procedure: CATARACT EXTRACTION PHACO AND INTRAOCULAR LENS PLACEMENT (IOC);  Surgeon: Perley Hamilton, MD;  Location: [facility] ORS;  Service: Ophthalmology;  Laterality: Left;  CDE: 9.27   CATARACT EXTRACTION W/PHACO Right 10/18/2016   Procedure: CATARACT EXTRACTION PHACO AND INTRAOCULAR LENS PLACEMENT (IOC);  Surgeon: Perley Hamilton, MD;  Location: [facility] ORS;  Service: Ophthalmology;  Laterality: Right;  CDE: 6.38   CHOLECYSTECTOMY     COLONOSCOPY WITH PROPOFOL  N/A 02/16/2021   Procedure:  COLONOSCOPY WITH PROPOFOL ;  Surgeon: Cindie Carlin POUR, DO;  Location: [facility] ENDO SUITE;  Service: Endoscopy;  Laterality: N/A;  1:30 / ASA 3   EYE SURGERY     POLYPECTOMY  02/16/2021   Procedure: POLYPECTOMY;  Surgeon: Cindie Carlin POUR, DO;  Location: [facility] ENDO SUITE;  Service: Endoscopy;;   SPINAL FUSION     cervical; screws and plates.   TOTAL ABDOMINAL HYSTERECTOMY  1980     reports that she has been smoking cigarettes. She started smoking about 50 years ago. She has a 50.8 pack-year smoking history. She has never used smokeless tobacco. She reports that she does not drink alcohol  and does not use drugs.   Family History  Problem Relation Age of Onset   Heart attack Father    Heart disease Father    Cancer Mother    Heart disease Mother    Cancer Brother    Cancer  Maternal Grandfather    Cancer Maternal Grandmother    Cancer Maternal Aunt    Cancer Maternal Aunt    Cancer Maternal Aunt    Cancer Maternal Aunt     Physical Exam:   Vitals:   10/18/23 1330 10/18/23 1345 10/18/23 1545 10/18/23 1607  BP: 137/67 (!) 126/56    Pulse: 71 70 65   Resp: (!) 24 (!) 24    Temp:    98.3 F (36.8 C)  TempSrc:    Oral  SpO2: 93% 94% 93%   Weight:      Height:       Constitutional: NAD, calm, comfortable EyePsychiatric: Normal judgment and insight. Alert and oriented x 3. Normal mood. s: PERRL, lids and conjunctivae normal ENMT: Mucous membranes are moist. Posterior pharynx clear of any exudate or lesions.Normal dentition.  Neurologic: CN II-XII grossly intact. Sensation intact, DTR normal. Strength 5/5 in all 4.  Neck: normal, supple, no masses, no thyromegaly Respiratory: Diffuse rhonchi, minimal wheezing, mild bilateral lower lobe crackles   Normal respiratory effort. No accessory muscle use.  Cardiovascular: Regular rate and rhythm, no murmurs / rubs / gallops. No extremity edema. 2+ pedal pulses. No carotid bruits.  Abdomen: no tenderness, no masses palpated. No  hepatosplenomegaly. Bowel sounds positive.  Musculoskeletal: no clubbing / cyanosis. No joint deformity upper and lower extremities. Good ROM, no contractures. Normal muscle tone.  Skin: no rashes, lesions, ulcers. No induration  Labs on admission:    I have personally reviewed following labs and imaging studies  CBC: Recent Labs  Lab 10/18/23 1033  WBC 6.6  NEUTROABS 4.6  HGB 13.0  HCT 41.7  MCV 93.7  PLT 228   Basic Metabolic Panel: Recent Labs  Lab 10/18/23 1033  NA 140  K 4.0  CL 101  CO2 25  GLUCOSE 163*  BUN 22  CREATININE 0.86  CALCIUM  8.8*  MG 1.3*   GFR: Estimated Creatinine Clearance: 71.8 mL/min (by C-G formula based on SCr of 0.86 mg/dL). Liver Function Tests: Recent Labs  Lab 10/18/23 1033  AST 26  ALT 25  ALKPHOS 85  BILITOT 0.5  PROT 6.9  ALBUMIN  3.3*    Urine analysis:    Component Value Date/Time   COLORURINE YELLOW 10/27/2019 2140   APPEARANCEUR HAZY (A) 10/27/2019 2140   LABSPEC 1.028 10/27/2019 2140   PHURINE 5.0 10/27/2019 2140   GLUCOSEU >=500 (A) 10/27/2019 2140   HGBUR NEGATIVE 10/27/2019 2140   BILIRUBINUR NEGATIVE 10/27/2019 2140   BILIRUBINUR negative 10/22/2019 1900   KETONESUR NEGATIVE 10/27/2019 2140   PROTEINUR NEGATIVE 10/27/2019 2140   UROBILINOGEN 0.2 10/22/2019 1900   NITRITE NEGATIVE 10/27/2019 2140   LEUKOCYTESUR NEGATIVE 10/27/2019 2140    Last A1C:  Lab Results  Component Value Date   HGBA1C 6.9 (H) 08/10/2023     Radiologic Exams on Admission:   CT Angio Chest PE W and/or Wo Contrast Result Date: 10/18/2023 CLINICAL DATA:  Pulmonary embolism (PE) suspected, high prob Pt arrived via POV from Dr Leisa office for evaluation of possible PE. Pt endorses SOB with exertion and recent travel to [place]. Pt endorses chest tightness as well EXAM: CT ANGIOGRAPHY CHEST WITH CONTRAST TECHNIQUE: Multidetector CT imaging of the chest was performed using the standard protocol during bolus administration of intravenous  contrast. Multiplanar CT image reconstructions and MIPs were obtained to evaluate the vascular anatomy. RADIATION DOSE REDUCTION: This exam was performed according to the departmental dose-optimization program which includes automated exposure control, adjustment of the mA  and/or kV according to patient size and/or use of iterative reconstruction technique. CONTRAST:  75mL OMNIPAQUE  IOHEXOL  350 MG/ML SOLN COMPARISON:  Chest XR, 10/18/2023.  CT chest, 09/28/2023 FINDINGS: Cardiovascular: Satisfactory opacification of the pulmonary arteries to the segmental level. No segmental or larger pulmonary embolus. Dilation of the main PA, measuring 3.6 cm. Normal heart size. No pericardial effusion. Moderate burden of calcified coronary atherosclerosis, greatest within the LAD. Mediastinum/Nodes: No enlarged mediastinal, hilar, or axillary lymph nodes. Thyroid  gland, trachea, and esophagus demonstrate no significant findings. Lungs/Pleura: Lungs are clear without focal consolidation, mass or suspicious pulmonary nodule. No pleural effusion or pneumothorax. Upper Abdomen: No acute abnormality. Musculoskeletal: No acute chest wall abnormality. Postsurgical changes of cervical fusion, incompletely imaged. No acute osseous findings. Review of the MIP images confirms the above findings. IMPRESSION: 1. No segmental or larger pulmonary embolus. No acute intrathoracic abnormality. 2. Main pulmonary artery dilation, likely reflecting underlying pulmonary arterial hypertension. Electronically Signed   By: Thom Hall M.D.   On: 10/18/2023 13:09   DG Chest Port 1 View Result Date: 10/18/2023 CLINICAL DATA:  Dyspnea EXAM: PORTABLE CHEST 1 VIEW COMPARISON:  September 28, 2023 FINDINGS: No focal consolidation.  No pleural effusions.  No pneumothorax. Unchanged mildly enlarged cardiomediastinal silhouette likely exaggerated by AP technique. No acute osseous findings. IMPRESSION: No acute findings. Electronically Signed   By: Michaeline Blanch  M.D.   On: 10/18/2023 11:20    EKG:   Independently reviewed.  Orders placed or performed during the hospital encounter of 10/18/23   EKG 12-Lead   EKG 12-Lead   ED EKG   ED EKG   EKG 12-Lead   ---------------------------------------------------------------------------------------------------------------------------------------    Assessment / Plan:   Principal Problem:   Acute on chronic respiratory failure with hypoxia (HCC) Active Problems:   Acute exacerbation of CHF (congestive heart failure) (HCC)   Anxiety   Carotid artery stenosis   Essential hypertension   Depression   GERD (gastroesophageal reflux disease)   CAD (coronary artery disease)   COPD (chronic obstructive pulmonary disease) (HCC)   Mixed hyperlipidemia   Aortic stenosis   Type 2 diabetes mellitus with hyperglycemia (HCC)   DOE (dyspnea on exertion)   Obesity, Class I, BMI 30.0-34.9 (see actual BMI)   Hypomagnesemia   Assessment and Plan: * Acute on chronic respiratory failure with hypoxia (HCC) - Multifactorial, COPD exacerbation with possible CHF with exacerbation -CT angiogram rule out PE, possible pulmonary artery hypertension -Admitted to telemetry floor under close observation -Initiating diuretics Lasix  40 mg IV twice daily - Will follow-up with echocardiogram, daily BMP, Reds clip reading -Will monitor I's and O's, daily weight  - Will continue DuoNeb bronchodilators every 6 hours, IV steroids, -Encourage incentive spirometer -Continue supplemental oxygen  with goal to taper off -Continue as needed mucolytics, spirometer, flutter valve,  Acute exacerbation of CHF (congestive heart failure) (HCC) - Monitoring I's and O's, daily weight, -IV diuretic Lasix  -Echocardiogram >>  -Labs including BNP and risk of bleeding  Carotid artery stenosis - Continue aspirin , statins  Anxiety - Currently stable, as needed Xanax   GERD (gastroesophageal reflux disease) Continue  PPI  Depression Continue home medication of Lexapro   Essential hypertension Reviewing and resuming home medication of Imdur , metoprolol , Holding Diovan , Patient will be on Lasix  40 mg IV twice daily   Hypomagnesemia Serum magnesium  1.3, repleted with 2 g of IV magnesium  Monitoring  Obesity, Class I, BMI 30.0-34.9 (see actual BMI) Body mass index is 36.66 kg/m. - Recommended patient to follow-up with the PCP,  with aggressive weight loss program -Dietary control increase exercise recommended  DOE (dyspnea on exertion) - Monitoring closely, obtaining echocardiogram, diuresing -Based on CTA rule out PE -Consult PT OT for evaluation  Type 2 diabetes mellitus with hyperglycemia (HCC) - Holding home regimen, continuing Farxiga  -Checking CBG q. ACHS, SSI coverage -Last A1c 6.9  Aortic stenosis - Aortic stenosis pulmonary hypertension -Maintain blood pressure check, diuresing -Follow-up with outpatient with cardiology and vascular  Mixed hyperlipidemia Continue statins  COPD (chronic obstructive pulmonary disease) (HCC) - Follow-up with your primary pulmonologist Dr. Neomi for further evaluation and recommendations -Current inhalers will be on hold, continue with DuoNeb every 6 hours -Encouraging incentive spirometer and flutter valve -Short burst of IV steroids  CAD (coronary artery disease) - Reviewing and continue home medication; including aspirin , statins, beta-blocker     Consults called:  none  -------------------------------------------------------------------------------------------------------------------------------------------- DVT prophylaxis:  heparin  injection 5,000 Units Start: 10/18/23 2200 SCDs Start: 10/18/23 1452   Code Status:   Code Status: Full Code   Admission status: Patient will be admitted as Inpatient, with a greater than 2 midnight length of stay. Level of care: Telemetry   Family Communication:  none at bedside  (The above findings  and plan of care has been discussed with patient in detail, the patient expressed understanding and agreement of above plan)  --------------------------------------------------------------------------------------------------------------------------------------------------  Disposition Plan:  Anticipated 1-2 days Status is: Inpatient Remains inpatient appropriate because: Needing respiratory support, IV diuretics, IV steroids   ----------------------------------------------------------------------------------------------------------------------------------  Time spent:  70  Min.  Was spent seeing and evaluating the patient, reviewing all medical records, drawn plan of care.  SIGNED: Adriana DELENA Grams, MD, FHM. FAAFP. [facility], Pager  (Please use [email or web address] to page/ or secure chat through epic) If 7PM-7AM, please contact night-coverage [email or web address],  10/18/2023, 4:11 PM

## 2023-10-18 NOTE — Assessment & Plan Note (Signed)
 Continue PPI.

## 2023-10-18 NOTE — ED Notes (Signed)
 Patient transported to CT

## 2023-10-18 NOTE — Assessment & Plan Note (Signed)
-  Currently stable, as needed Xanax

## 2023-10-18 NOTE — ED Notes (Signed)
 Pt ambulated on RA. Pt's o2 saturation went down to 88-89%. 2L placed back on patient. MD Dixon aware.

## 2023-10-18 NOTE — Assessment & Plan Note (Signed)
-   Follow-up with your primary pulmonologist Dr. Neomi for further evaluation and recommendations -Current inhalers will be on hold, continue with DuoNeb every 6 hours -Encouraging incentive spirometer and flutter valve -Short burst of IV steroids

## 2023-10-18 NOTE — Assessment & Plan Note (Addendum)
 Active smoker - 11/09/2022  After extensive coaching inhaler device,  effectiveness =    60% short > try off breo due to cough and start symbicort  80 or dulera  100 2bid and return for pfts - PFT's  01/11/23  FEV1 1.32 (55 % ) ratio 0.81  p 7 % improvement from saba p dulera  prior to study with DLCO  11.33 (58%)   and FV curve no obstruction and ERV 33%  at wt 206   - 02/09/2023  After extensive coaching inhaler device,  effectiveness =    80% (delayed insp) > continue dulera  100 2bid / add zyrtec  for rhinitis and d/c acei  - 08/10/2023 ov : worse off dulera  due to insurance > try symbicort  80 or breyna  80   Reports today her cough had resolved but doe no better with above recs and has not seen ENT but lungs are clear on exam and still sob walking across the room so needs further look (see doe)

## 2023-10-18 NOTE — TOC CM/SW Note (Signed)
 Transition of Care [facility]) - Inpatient Brief Assessment   Patient Details  Name: ANDRIAN URBACH MRN: [id number] Date of Birth: May 31, 1955  Transition of [facility]) CM/SW Contact:    Lucie Lunger, LCSWA Phone Number: 10/18/2023, 3:55 PM   Clinical Narrative: Transition of Care Department [facility]) has reviewed patient and no TOC needs have been identified at this time. We will continue to monitor patient advancement through interdiciplinary progression rounds. If new patient transition needs arise, please place a TOC consult.  Transition of Care Asessment: Insurance and Status: Insurance coverage has been reviewed Patient has primary care physician: Yes Home environment has been reviewed: From home Prior level of function:: Independent Prior/Current Home Services: No current home services Social Drivers of Health Review: SDOH reviewed no interventions necessary Readmission risk has been reviewed: Yes Transition of care needs: no transition of care needs at this time

## 2023-10-18 NOTE — Patient Instructions (Signed)
 GO to [facility]  ER for evaluation of your severe shortness of breath with activity and feeling chest pressure and lightheadedness after your travel to [facility]

## 2023-10-18 NOTE — Assessment & Plan Note (Signed)
-   Monitoring I's and O's, daily weight, -IV diuretic Lasix  -Echocardiogram >>  -Labs including BNP and risk of bleeding

## 2023-10-18 NOTE — Assessment & Plan Note (Signed)
 Body mass index is 36.66 kg/m. - Recommended patient to follow-up with the PCP, with aggressive weight loss program -Dietary control increase exercise recommended

## 2023-10-18 NOTE — Assessment & Plan Note (Signed)
Continue aspirin, statins. ?

## 2023-10-18 NOTE — ED Triage Notes (Signed)
 Pt arrived via POV from Dr Leisa office for evaluation of possible PE. Pt endorses SOB with exertion and recent travel to [place]. Pt endorses chest tightness as well.

## 2023-10-18 NOTE — Assessment & Plan Note (Signed)
 Serum magnesium  1.3, repleted with 2 g of IV magnesium  Monitoring

## 2023-10-18 NOTE — Progress Notes (Signed)
 [name], female    DOB: 09-19-1955    MRN: [id number]   Brief patient profile:  9   yowf  active smoker  referred to pulmonary clinic in [place]  11/09/2022 by Dr Melvenia for new doe Jan 2024 corrected with Ran[place] then Sept 21st with abrupt chest and sinus congestion > yellow mucus rocepin/ amox then levaquin and prednisone  and completing 11/10/22.  Cx spine surgery around 2010 > upper airway wheeze ever since   History of Present Illness  11/09/2022  Pulmonary/ 1st office eval/ [name] / [place] [facility]  Chief Complaint  Patient presents with   Consult  Dyspnea:  85% better and still on last doses of abx /pred Cough: still some light green mucus with sense of nasal/ chest congestion on mucinex  dm  Sleep: 30 degrees hob  SABA use: couple times a day at most  02: none  Lung cancer screen: per PCP but would not start until sort out the present problems  Rec Plan A = Automatic = Always=    Breztri  (symbicort  80) Take 2 puffs first thing in am and then another 2 puffs about 12 hours later.   Work on inhaler technique:  >>>  Remember how golfers warm up by taking practice swings - do this with an empty inhaler  Plan B = Backup (to supplement plan A, not to replace it) Only use your albuterol  inhaler as a rescue medication The key is to stop smoking completely before smoking completely stops you! For cough/ congestion > mucinex  or mucinex  dm  up to maximum of  1200 mg every 12 hours as needed  My office will be contacting you by phone for referral for repeat CT 3  Please schedule a follow up visit in 3 months but call sooner if needed  with all medications /inhalers/ solutions in hand    02/09/2023  f/u ov/[place] office/[name] re: AB  maint on dulera   did bring inhalers  Chief Complaint  Patient presents with   Pulmonary Nodule  Dyspnea:  improved  Cough: freq throat clearing assoc with sensation  of pnds  Sleeping: ok s   resp cc  SABA use: none 02: none Rec Valsartan  40  mg one daily in place of lisinopril   Try zyrtec  OTC at bedtime  Work on inhaler technique:     08/10/2023  f/u ov/[place] office/[name] re: AB ? Acei case vs resdual UACS from neck surgery 2010  maint on no rx/ still smoking   Chief Complaint  Patient presents with   Follow-up    Increased SOB past couple of months- relates to hot weather. She has also had some wheezing. Ran out of Dulera  in Feb 2025 and has been using her albuterol  inhaler 4-5 x per day on average.   Dyspnea:  worse off dulera   Cough: minimal pnds much better off acei  Sleeping: flat bed one pillow s    resp cc cpap/ 2lpm  SABA use: 4-5 x time per day  02: 2lpm hs only  Lung cancer screening: due q  Jan 2026  Rec Try symbicort  80 or Breya 80 Take 2 puffs first thing in am and then another 2 puffs about 12 hours later In place of dulera   My office will be contacting you by phone for referral to ENT for your throat wheezing  > not done as of  10/18/2023 The key is to stop smoking completely before smoking completely stops you! Please schedule a follow up visit in 6 months  but call sooner if needed   Syncope 09/28/23 [facility] > w/u with CT chest s contrast, no d dimer or BNP      10/18/2023 ACUTE ov/[place] office/[name] re: sob  maint on breyna   still smoking  Chief Complaint  Patient presents with   Acute Visit    Increased SOB over the past week- went out of town for work and passed out twice while walking. She has had cough- non prod, chest tightness and wheezing. She is using her albuterol  inhaler at least 3 x daily without relief.    Dyspnea:  bad to worse sat am Sept 20 p being able to walk slow thru [place] and [place] pm 10/14/23 and worse since then assoc with presyncope Cough: assoc with cough same day  Sleeping: flat in bed one pillow with cpap/ 02  02: 2lpm  HFA  no better   Lung cancer screening: neg CT chest 09/28/23   No obvious day to day or daytime variability or assoc excess/ purulent sputum or  mucus plugs or hemoptysis or cp or overt sinus or hb symptoms.    Also denies any obvious fluctuation of symptoms with weather or environmental changes or other aggravating or alleviating factors except as outlined above   No unusual exposure hx or h/o childhood pna/ asthma or knowledge of premature birth.  Current Allergies, Complete Past Medical History, Past Surgical History, Family History, and Social History were reviewed in [employer] record.  ROS  The following are not active complaints unless bolded Hoarseness, sore throat, dysphagia, dental problems, itching, sneezing,  nasal congestion or discharge of excess mucus or purulent secretions, ear ache,   fever, chills, sweats, unintended wt loss or wt gain, classically pleuritic or exertional cp,  orthopnea pnd or arm/hand swelling  or leg swelling, presyncope, palpitations, abdominal pain, anorexia, nausea, vomiting, diarrhea  or change in bowel habits or change in bladder habits, change in stools or change in urine, dysuria, hematuria,  rash, arthralgias, visual complaints, headache, numbness, weakness or ataxia or problems with walking or coordination,  change in mood or  memory.        Current Meds  Medication Sig   acetaminophen  (TYLENOL ) 325 MG tablet Take 650 mg by mouth every 6 (six) hours as needed for moderate pain (pain score 4-6).   albuterol  (VENTOLIN  HFA) 108 (90 Base) MCG/ACT inhaler TAKE 2 PUFFS BY MOUTH EVERY 6 HOURS AS NEEDED FOR WHEEZE OR SHORTNESS OF BREATH   aspirin  EC 81 MG tablet Take 81 mg by mouth daily.   atorvastatin  (LIPITOR ) 40 MG tablet TAKE 1 TABLET (40 MG TOTAL) BY MOUTH EVERY OTHER DAY. TAKE AT DINNER   budesonide -formoterol  (BREYNA ) 80-4.5 MCG/ACT inhaler Inhale 2 puffs into the lungs 2 (two) times daily.   Continuous Glucose Sensor (FREESTYLE LIBRE 3 PLUS SENSOR) MISC Change sensor every 15 days.   escitalopram  (LEXAPRO ) 20 MG tablet TAKE 1 TABLET (20 MG TOTAL) BY MOUTH DAILY AT 6  (SIX) AM.   FARXIGA  10 MG TABS tablet TAKE 1 TABLET BY MOUTH EVERY DAY   fluticasone  (FLONASE ) 50 MCG/ACT nasal spray Place 1 spray into both nostrils daily.   furosemide  (LASIX ) 20 MG tablet TAKE 1 TABLET BY MOUTH EVERY DAY   isosorbide  mononitrate (IMDUR ) 30 MG 24 hr tablet TAKE 1 TABLET BY MOUTH EVERY DAY   metFORMIN  (GLUCOPHAGE ) 1000 MG tablet Take 1 tablet (1,000 mg total) by mouth 2 (two) times daily.   metoprolol  succinate (TOPROL -XL) 25 MG 24 hr  tablet TAKE 1.5 TABLETS BY MOUTH DAILY (Patient taking differently: Take 25 mg by mouth See admin instructions. Take 1 tablet in the morning and 1/2 tablet every evening)   nystatin  cream (MYCOSTATIN ) Apply 1 Application topically 2 (two) times daily.   valsartan  (DIOVAN ) 40 MG tablet Take 0.5 tablets (20 mg total) by mouth daily.              Past Medical History:  Diagnosis Date   Anxiety    Aortic stenosis    CAD (coronary artery disease)    a. 10/2014: Ant STEMI s/p DES to dLAD   Carotid disease, bilateral (HCC)    a. Duplex 12/2014: mild-mod atherosclerotic plaque without hemodynamically significant stenosis.   COPD (chronic obstructive pulmonary disease) (HCC)    Essential hypertension    HLD (hyperlipidemia)    Morbid obesity (HCC)    Myocardial infarction (HCC) 11/17/2014   PONV (postoperative nausea and vomiting)    Shingles    Sleep apnea    Uses CPAP   Type 2 diabetes mellitus (HCC)       Objective:    Wts  10/18/2023        213   08/10/2023       202  02/09/23 188 lb (85.3 kg)  01/14/23 193 lb (87.5 kg)  11/09/22 200 lb 6.4 oz (90.9 kg)     Vital signs reviewed  10/18/2023  - Note at rest 02 sats  90% on RA   General appearance:    Mod obese wf nad at rest     HEENT : Oropharynx  hfa       NECK :  without  apparent JVD/ palpable Nodes/TM    LUNGS: no acc muscle use,  Min barrel  contour chest wall with bilateral  slightly decreased bs s audible wheeze and  without cough on insp or exp maneuvers and min   Hyperresonant  to  percussion bilaterally    CV:  RRR  no s3 or  2/6 SEM s  increase in P2, and no edema   ABD:  soft and nontender    MS:  Nl gait/ ext warm without deformities Or obvious joint restrictions  calf tenderness, cyanosis or clubbing     SKIN: warm and dry without lesions    NEURO:  alert, approp, nl sensorium with  no motor or cerebellar deficits apparent.               Assessment   Assessment & Plan Asthmatic bronchitis , chronic (HCC) Active smoker - 11/09/2022  After extensive coaching inhaler device,  effectiveness =    60% short > try off breo due to cough and start symbicort  80 or dulera  100 2bid and return for pfts - PFT's  01/11/23  FEV1 1.32 (55 % ) ratio 0.81  p 7 % improvement from saba p dulera  prior to study with DLCO  11.33 (58%)   and FV curve no obstruction and ERV 33%  at wt 206   - 02/09/2023  After extensive coaching inhaler device,  effectiveness =    80% (delayed insp) > continue dulera  100 2bid / add zyrtec  for rhinitis and d/c acei  - 08/10/2023 ov : worse off dulera  due to insurance > try symbicort  80 or breyna  80   Reports today her cough had resolved but doe no better with above recs and has not seen ENT but lungs are clear on exam and still sob walking across the room so needs further look (  see doe)   DOE (dyspnea on exertion) Abruptly worse since am  10/15/23 with assoc mild cough and presyncope / chest tightness  >>> referred to ER with dx CHF/AS,  PE or covid infection       Each maintenance medication was reviewed in detail including emphasizing most importantly the difference between maintenance and prns and under what circumstances the prns are to be triggered using an action plan format where appropriate.  Total time for H and P, chart review, counseling, reviewing hfa device(s) and generating customized AVS unique to this ACUTE  office visit / same day charting = 45 min         AVS  Patient Instructions  GO to [facility]  ER for  evaluation of your severe shortness of breath with activity and feeling chest pressure and lightheadedness after your travel to [facility], [place] 10/18/2023

## 2023-10-18 NOTE — Plan of Care (Signed)

## 2023-10-18 NOTE — Hospital Course (Signed)
 [name] is a 68 year old female with history of anxiety, aortic stenosis, CAD non-STEMI s/p DES to dLAD, COPD, HTN, HLD, morbid obesity, ASO, DM II.... Presenting from pulmonology office for acute on chronic respiratory failure, increased nonproductive cough, congestion, shortness of breath, hypoxia, episodes of dizziness, near passing out spells while walking.  Shortness of breath with minimal exertion, symptoms progressively getting worse over past few days. Patient was seen by her pulmonologist subsequently sent to the ED for evaluation of shortness of breath and and ruling out PE.  ED evaluation: Patient was satting 88-89% with ambulation and 2 L of oxygen  Blood pressure (!) 126/56, pulse 70, temperature 98.4 F (36.9 C), temperature source Oral, resp. rate (!) 24, height 5' 4 (1.626 m), weight 96.9 kg, SpO2 94% 2 L of oxygen .  LABs: CBC, CMP reviewed within normal limits with exception of glucose 163, calcium  8.1, magnesium  1.3, D-dimer elevated 3.12.  Respiratory panel RSV, influenza A/B, COVID all negative VBG: pH 7.43, pCO2 43, PO249, bicarb 28.5 BNP 373  CTA chest: Negative for PE, 2. Main pulmonary artery dilation, likely reflecting underlying pulmonary arterial hypertension.  Requested for patient to admitted for acute on chronic respiratory failure possibly CHF exacerbation with COPD exacerbation

## 2023-10-18 NOTE — Assessment & Plan Note (Signed)
-   Monitoring closely, obtaining echocardiogram, diuresing -Based on CTA rule out PE -Consult PT OT for evaluation

## 2023-10-18 NOTE — Assessment & Plan Note (Addendum)
 Abruptly worse since am  10/15/23 with assoc mild cough and presyncope / chest tightness  >>> referred to ER with dx CHF/AS,  PE or covid infection       Each maintenance medication was reviewed in detail including emphasizing most importantly the difference between maintenance and prns and under what circumstances the prns are to be triggered using an action plan format where appropriate.  Total time for H and P, chart review, counseling, reviewing hfa device(s) and generating customized AVS unique to this ACUTE  office visit / same day charting = 45 min

## 2023-10-18 NOTE — Assessment & Plan Note (Signed)
 Reviewing and resuming home medication of Imdur , metoprolol , Holding Diovan , Patient will be on Lasix  40 mg IV twice daily

## 2023-10-18 NOTE — ED Provider Notes (Signed)
 [place] EMERGENCY DEPARTMENT AT [facility] Provider Note   CSN: [id number] Arrival date & time: 10/18/23  9063     Patient presents with: Shortness of Breath   [name] is a 68 y.o. female.    Shortness of Breath Associated symptoms: cough   Patient presents for shortness of breath.  Medical history includes anxiety, CAD, COPD, HTN, HLD, CHF, DM, sleep apnea, arthritis, depression, GERD, pulmonary artery hypertension.  She had recent travel to [place].  She had 2 recent near-syncopal episodes while walking.  She has had exertional dyspnea, nonproductive cough, chest tightness, and wheezing.  She has been using her albuterol  inhaler without relief.  She has had shortness of breath with exertion and chest tightness.  She was seen by Dr. Darlean and sent in for further evaluation.  Currently, she endorses some mild shortness of breath at rest.  She has ongoing chest tightness but denies any areas of pain.     Prior to Admission medications   Medication Sig Start Date End Date Taking? Authorizing Provider  aspirin  EC 81 MG tablet Take 81 mg by mouth daily.   Yes [provider]  atorvastatin  (LIPITOR ) 40 MG tablet TAKE 1 TABLET (40 MG TOTAL) BY MOUTH EVERY OTHER DAY. TAKE AT [facility] 07/18/23  Yes [name], MD  budesonide -formoterol  (BREYNA ) 80-4.5 MCG/ACT inhaler Inhale 2 puffs into the lungs 2 (two) times daily.   Yes [provider]  escitalopram  (LEXAPRO ) 20 MG tablet TAKE 1 TABLET (20 MG TOTAL) BY MOUTH DAILY AT 6 (SIX) AM. 07/22/23 01/18/24 Yes [name], MD  FARXIGA  10 MG TABS tablet TAKE 1 TABLET BY MOUTH EVERY DAY 09/22/23  Yes [name], MD  isosorbide  mononitrate (IMDUR ) 30 MG 24 hr tablet TAKE 1 TABLET BY MOUTH EVERY DAY 06/21/23  Yes Debera Jayson MATSU, MD  metoprolol  succinate (TOPROL -XL) 25 MG 24 hr tablet TAKE 1.5 TABLETS BY MOUTH DAILY Patient taking differently: Take 25 mg by mouth See admin instructions. Take 1 tablet in the  morning and 1/2 tablet every evening 01/24/23  Yes Debera Jayson MATSU, MD  nystatin  cream (MYCOSTATIN ) Apply 1 Application topically 2 (two) times daily. 10/01/22  Yes Antonetta Rollene BRAVO, MD  valsartan  (DIOVAN ) 40 MG tablet Take 0.5 tablets (20 mg total) by mouth daily. 10/05/23  Yes Debera Jayson MATSU, MD  Continuous Glucose Sensor (FREESTYLE LIBRE 3 PLUS SENSOR) MISC Change sensor every 15 days. 08/10/23   [name], MD    Allergies: Codeine, Morphine, and Sulfa antibiotics    Review of Systems  Respiratory:  Positive for cough, chest tightness and shortness of breath.   All other systems reviewed and are negative.   Updated Vital Signs BP (!) 119/51 (BP Location: Left Arm)   Pulse 64   Temp 98.2 F (36.8 C) (Oral)   Resp 18   Ht 5' 4 (1.626 m)   Wt 95.4 kg   SpO2 96%   BMI 36.10 kg/m   Physical Exam Vitals and nursing note reviewed.  Constitutional:      General: She is not in acute distress.    Appearance: She is well-developed. She is not ill-appearing, toxic-appearing or diaphoretic.  HENT:     Head: Normocephalic and atraumatic.  Eyes:     Conjunctiva/sclera: Conjunctivae normal.  Cardiovascular:     Rate and Rhythm: Normal rate and regular rhythm.     Heart sounds: No murmur heard. Pulmonary:     Effort: Pulmonary effort is normal.  Tachypnea present. No respiratory distress.     Breath sounds: Normal breath sounds. No decreased breath sounds, wheezing, rhonchi or rales.  Chest:     Chest wall: No tenderness.  Abdominal:     Palpations: Abdomen is soft.     Tenderness: There is no abdominal tenderness.  Musculoskeletal:        General: No swelling. Normal range of motion.     Cervical back: Normal range of motion and neck supple.     Right lower leg: No edema.     Left lower leg: No edema.  Skin:    General: Skin is warm and dry.     Coloration: Skin is not cyanotic or pale.  Neurological:     General: No focal deficit present.     Mental Status: She  is alert and oriented to person, place, and time.  Psychiatric:        Mood and Affect: Mood normal.        Behavior: Behavior normal.     (all labs ordered are listed, but only abnormal results are displayed) Labs Reviewed  COMPREHENSIVE METABOLIC PANEL WITH GFR - Abnormal; Notable for the following components:      Result Value   Glucose, Bld 163 (*)    Calcium  8.8 (*)    Albumin  3.3 (*)    All other components within normal limits  BRAIN NATRIURETIC PEPTIDE - Abnormal; Notable for the following components:   B Natriuretic Peptide 373.0 (*)    All other components within normal limits  BLOOD GAS, VENOUS - Abnormal; Notable for the following components:   pCO2, Ven 43 (*)    pO2, Ven 49 (*)    Bicarbonate 28.5 (*)    Acid-Base Excess 3.7 (*)    All other components within normal limits  CBC WITH DIFFERENTIAL/PLATELET - Abnormal; Notable for the following components:   RDW 15.9 (*)    All other components within normal limits  D-DIMER, QUANTITATIVE - Abnormal; Notable for the following components:   D-Dimer, Quant 3.12 (*)    All other components within normal limits  MAGNESIUM  - Abnormal; Notable for the following components:   Magnesium  1.3 (*)    All other components within normal limits  LIPID PANEL - Abnormal; Notable for the following components:   HDL 32 (*)    All other components within normal limits  RESP PANEL BY RT-PCR (RSV, FLU A&B, COVID)  RVPGX2  EXPECTORATED SPUTUM ASSESSMENT W GRAM STAIN, RFLX TO RESP C  MAGNESIUM   PHOSPHORUS  HEMOGLOBIN A1C  GLUCOSE, CAPILLARY  TROPONIN I (HIGH SENSITIVITY)  TROPONIN I (HIGH SENSITIVITY)    EKG: EKG Interpretation Date/Time:  Tuesday October 18 2023 10:13:59 EDT Ventricular Rate:  70 PR Interval:  133 QRS Duration:  79 QT Interval:  451 QTC Calculation: 487 R Axis:   110  Text Interpretation: Sinus rhythm Right axis deviation Nonspecific T abnormalities, diffuse leads Borderline prolonged QT interval  Confirmed by Melvenia Motto ([id number]) on 10/18/2023 11:43:53 AM  Radiology: CT Angio Chest PE W and/or Wo Contrast Result Date: 10/18/2023 CLINICAL DATA:  Pulmonary embolism (PE) suspected, high prob Pt arrived via POV from Dr Leisa office for evaluation of possible PE. Pt endorses SOB with exertion and recent travel to [place]. Pt endorses chest tightness as well EXAM: CT ANGIOGRAPHY CHEST WITH CONTRAST TECHNIQUE: Multidetector CT imaging of the chest was performed using the standard protocol during bolus administration of intravenous contrast. Multiplanar CT image reconstructions and MIPs were obtained to evaluate  the vascular anatomy. RADIATION DOSE REDUCTION: This exam was performed according to the departmental dose-optimization program which includes automated exposure control, adjustment of the mA and/or kV according to patient size and/or use of iterative reconstruction technique. CONTRAST:  75mL OMNIPAQUE  IOHEXOL  350 MG/ML SOLN COMPARISON:  Chest XR, 10/18/2023.  CT chest, 09/28/2023 FINDINGS: Cardiovascular: Satisfactory opacification of the pulmonary arteries to the segmental level. No segmental or larger pulmonary embolus. Dilation of the main PA, measuring 3.6 cm. Normal heart size. No pericardial effusion. Moderate burden of calcified coronary atherosclerosis, greatest within the LAD. Mediastinum/Nodes: No enlarged mediastinal, hilar, or axillary lymph nodes. Thyroid  gland, trachea, and esophagus demonstrate no significant findings. Lungs/Pleura: Lungs are clear without focal consolidation, mass or suspicious pulmonary nodule. No pleural effusion or pneumothorax. Upper Abdomen: No acute abnormality. Musculoskeletal: No acute chest wall abnormality. Postsurgical changes of cervical fusion, incompletely imaged. No acute osseous findings. Review of the MIP images confirms the above findings. IMPRESSION: 1. No segmental or larger pulmonary embolus. No acute intrathoracic abnormality. 2. Main pulmonary artery  dilation, likely reflecting underlying pulmonary arterial hypertension. Electronically Signed   By: Thom Hall M.D.   On: 10/18/2023 13:09   DG Chest Port 1 View Result Date: 10/18/2023 CLINICAL DATA:  Dyspnea EXAM: PORTABLE CHEST 1 VIEW COMPARISON:  September 28, 2023 FINDINGS: No focal consolidation.  No pleural effusions.  No pneumothorax. Unchanged mildly enlarged cardiomediastinal silhouette likely exaggerated by AP technique. No acute osseous findings. IMPRESSION: No acute findings. Electronically Signed   By: Michaeline Blanch M.D.   On: 10/18/2023 11:20     Procedures   Medications Ordered in the ED  heparin  injection 5,000 Units (has no administration in time range)  sodium chloride  flush (NS) 0.9 % injection 3 mL (3 mLs Intravenous Not Given 10/18/23 1500)  sodium chloride  flush (NS) 0.9 % injection 3 mL (3 mLs Intravenous Not Given 10/18/23 1500)  acetaminophen  (TYLENOL ) tablet 650 mg (has no administration in time range)    Or  acetaminophen  (TYLENOL ) suppository 650 mg (has no administration in time range)  oxyCODONE  (Oxy IR/ROXICODONE ) immediate release tablet 5 mg (has no administration in time range)  HYDROmorphone  (DILAUDID ) injection 0.5-1 mg (has no administration in time range)  traZODone  (DESYREL ) tablet 25 mg (has no administration in time range)  senna-docusate (Senokot-S) tablet 1 tablet (has no administration in time range)  bisacodyl  (DULCOLAX) EC tablet 5 mg (has no administration in time range)  ondansetron  (ZOFRAN ) tablet 4 mg (has no administration in time range)    Or  ondansetron  (ZOFRAN ) injection 4 mg (has no administration in time range)  hydrALAZINE  (APRESOLINE ) injection 10 mg (has no administration in time range)  furosemide  (LASIX ) injection 40 mg (has no administration in time range)  aspirin  EC tablet 81 mg (has no administration in time range)  atorvastatin  (LIPITOR ) tablet 40 mg (has no administration in time range)  escitalopram  (LEXAPRO ) tablet 20 mg  (has no administration in time range)  dapagliflozin  propanediol (FARXIGA ) tablet 10 mg (has no administration in time range)  isosorbide  mononitrate (IMDUR ) 24 hr tablet 30 mg (has no administration in time range)  nystatin  cream (MYCOSTATIN ) 1 Application (has no administration in time range)  insulin  aspart (novoLOG ) injection 0-15 Units (has no administration in time range)  metoprolol  succinate (TOPROL -XL) 24 hr tablet 25 mg (25 mg Oral Given 10/18/23 1633)  ipratropium (ATROVENT ) nebulizer solution 0.5 mg (has no administration in time range)  levalbuterol  (XOPENEX ) nebulizer solution 0.63 mg (has no administration in time range)  magnesium  sulfate IVPB 2 g 50 mL (2 g Intravenous New Bag/Given 10/18/23 1210)  iohexol  (OMNIPAQUE ) 350 MG/ML injection 75 mL (75 mLs Intravenous Contrast Given 10/18/23 1200)  furosemide  (LASIX ) injection 40 mg (40 mg Intravenous Given 10/18/23 1343)  methylPREDNISolone  sodium succinate (SOLU-MEDROL ) 125 mg/2 mL injection 80 mg (80 mg Intravenous Given 10/18/23 1633)                                    Medical Decision Making Amount and/or Complexity of Data Reviewed Labs: ordered. Radiology: ordered.  Risk Prescription drug management. Decision regarding hospitalization.   This patient presents to the ED for concern of shortness of breath, this involves an extensive number of treatment options, and is a complaint that carries with it a high risk of complications and morbidity.  The differential diagnosis includes CHF exacerbation, reactive airway disease, pneumonia, PE   Co morbidities / Chronic conditions that complicate the patient evaluation  anxiety, CAD, COPD, HTN, HLD, CHF, DM, sleep apnea, arthritis, depression, GERD, pulmonary artery hypertension   Additional history obtained:  Additional history obtained from EMR External records from outside source obtained and reviewed including N/A   Lab Tests:  I Ordered, and personally interpreted  labs.  The pertinent results include: Normal hemoglobin, no leukocytosis, normal kidney function, normal electrolytes.  BNP and D-dimer are elevated.  Troponin is normal.   Imaging Studies ordered:  I ordered imaging studies including chest x-ray, CTA chest I independently visualized and interpreted imaging which showed no acute findings I agree with the radiologist interpretation   Cardiac Monitoring: / EKG:  The patient was maintained on a cardiac monitor.  I personally viewed and interpreted the cardiac monitored which showed an underlying rhythm of: Sinus rhythm   Problem List / ED Course / Critical interventions / Medication management  Patient presents for chest tightness and shortness of breath.  She was seen by her pulmonologist earlier this morning and sent into the ED for further evaluation.  On arrival, vital signs notable for mild tachypnea.  On exam, she is able to speak in complete sentences.  Her lungs are clear to auscultation.  She did have some recent travel which raises concern for possible PE.  She also has history of CHF.  Patient was placed on monitor.  Workup was initiated.  Initial lab work notable for elevated BNP and D-dimer.  Patient underwent CTA of chest which did not show any focal consolidations or evidence of PE.  Symptoms likely secondary to CHF.  She states that she currently takes 20 mg of Lasix  daily.  Dose of IV Lasix  was ordered.  Patient did have good urinary output while in the ED.  Her symptoms did mildly improved.  With walking on room air, she did have desaturations and SpO2 to 87%.  She was placed back on supplemental oxygen .  Given her acute hypoxic respiratory failure, patient was admitted for further management. I ordered medication including magnesium  sulfate for hypomagnesemia; Lasix  for diuresis Reevaluation of the patient after these medicines showed that the patient improved I have reviewed the patients home medicines and have made adjustments  as needed  Social Determinants of Health:  Lives independently  CRITICAL CARE Performed by: Bernardino Fireman   Total critical care time: 31 minutes  Critical care time was exclusive of separately billable procedures and treating other patients.  Critical care was necessary to treat or prevent imminent or life-threatening  deterioration.  Critical care was time spent personally by me on the following activities: development of treatment plan with patient and/or surrogate as well as nursing, discussions with consultants, evaluation of patient's response to treatment, examination of patient, obtaining history from patient or surrogate, ordering and performing treatments and interventions, ordering and review of laboratory studies, ordering and review of radiographic studies, pulse oximetry and re-evaluation of patient's condition.     Final diagnoses:  Acute on chronic congestive heart failure, unspecified heart failure type (HCC)  Acute respiratory failure with hypoxia [facility])    ED Discharge Orders     None          Melvenia Motto, MD 10/18/23 1643

## 2023-10-18 NOTE — Assessment & Plan Note (Signed)
 Continue statins

## 2023-10-18 NOTE — Assessment & Plan Note (Signed)
-   Continue home medication of Lexapro

## 2023-10-18 NOTE — ED Notes (Signed)
 Nurse Charlies is aware patient is on the way up.

## 2023-10-18 NOTE — ED Notes (Signed)
 [facility]porter called to transport pt to 300.

## 2023-10-18 NOTE — Assessment & Plan Note (Signed)
-   Holding home regimen, continuing Farxiga  -Checking CBG q. ACHS, SSI coverage -Last A1c 6.9

## 2023-10-18 NOTE — Assessment & Plan Note (Addendum)
-   Multifactorial, COPD exacerbation with possible CHF with exacerbation -CT angiogram rule out PE, possible pulmonary artery hypertension -Admitted to telemetry floor under close observation -Initiating diuretics Lasix  40 mg IV twice daily - Will follow-up with echocardiogram, daily BMP, Reds clip reading -Will monitor I's and O's, daily weight  - Will continue DuoNeb bronchodilators every 6 hours, IV steroids, -Encourage incentive spirometer -Continue supplemental oxygen  with goal to taper off -Continue as needed mucolytics, spirometer, flutter valve,

## 2023-10-19 ENCOUNTER — Other Ambulatory Visit (HOSPITAL_COMMUNITY): Payer: Self-pay | Admitting: *Deleted

## 2023-10-19 ENCOUNTER — Other Ambulatory Visit: Payer: Self-pay | Admitting: Physician Assistant

## 2023-10-19 ENCOUNTER — Inpatient Hospital Stay (HOSPITAL_COMMUNITY)

## 2023-10-19 DIAGNOSIS — R55 Syncope and collapse: Secondary | ICD-10-CM

## 2023-10-19 DIAGNOSIS — I5031 Acute diastolic (congestive) heart failure: Secondary | ICD-10-CM | POA: Diagnosis not present

## 2023-10-19 DIAGNOSIS — I25119 Atherosclerotic heart disease of native coronary artery with unspecified angina pectoris: Secondary | ICD-10-CM

## 2023-10-19 DIAGNOSIS — I272 Pulmonary hypertension, unspecified: Secondary | ICD-10-CM | POA: Diagnosis not present

## 2023-10-19 DIAGNOSIS — I350 Nonrheumatic aortic (valve) stenosis: Secondary | ICD-10-CM | POA: Diagnosis not present

## 2023-10-19 DIAGNOSIS — J9621 Acute and chronic respiratory failure with hypoxia: Secondary | ICD-10-CM | POA: Diagnosis not present

## 2023-10-19 LAB — GLUCOSE, CAPILLARY
Glucose-Capillary: 260 mg/dL — ABNORMAL HIGH (ref 70–99)
Glucose-Capillary: 203 mg/dL — ABNORMAL HIGH (ref 70–99)
Glucose-Capillary: 382 mg/dL — ABNORMAL HIGH (ref 70–99)
Glucose-Capillary: 273 mg/dL — ABNORMAL HIGH (ref 70–99)

## 2023-10-19 LAB — ECHOCARDIOGRAM COMPLETE
AR max vel: 1.13 cm2
AV Area VTI: 1.16 cm2
AV Area mean vel: 1.31 cm2
AV Mean grad: 10 mmHg
AV Peak grad: 21.2 mmHg
Ao pk vel: 2.3 m/s
Area-P 1/2: 5.02 cm2
Height: 64 in
S' Lateral: 2.3 cm
Weight: 3287.5 [oz_av]

## 2023-10-19 LAB — BASIC METABOLIC PANEL WITH GFR
Anion gap: 15 (ref 5–15)
BUN: 23 mg/dL (ref 8–23)
CO2: 28 mmol/L (ref 22–32)
Calcium: 8.6 mg/dL — ABNORMAL LOW (ref 8.9–10.3)
Chloride: 96 mmol/L — ABNORMAL LOW (ref 98–111)
Creatinine, Ser: 0.82 mg/dL (ref 0.44–1.00)
GFR, Estimated: >60 mL/min (ref >60)
Glucose, Bld: 275 mg/dL — ABNORMAL HIGH (ref 70–99)
Potassium: 4 mmol/L (ref 3.5–5.1)
Sodium: 139 mmol/L (ref 135–145)

## 2023-10-19 LAB — HEMOGLOBIN A1C
Hgb A1c MFr Bld: 7.3 % — ABNORMAL HIGH (ref 4.8–5.6)
Mean Plasma Glucose: 162.81 mg/dL

## 2023-10-19 LAB — BRAIN NATRIURETIC PEPTIDE: B Natriuretic Peptide: 545 pg/mL — ABNORMAL HIGH (ref 0.0–100.0)

## 2023-10-19 MED ORDER — PERFLUTREN LIPID MICROSPHERE
1.0000 mL | INTRAVENOUS | Status: AC | PRN
  Administered 2023-10-19: 3 mL via INTRAVENOUS

## 2023-10-19 MED ORDER — FREE WATER
500.0000 mL | Freq: Once | Status: AC
  Administered 2023-10-20: 500 mL via ORAL

## 2023-10-19 NOTE — H&P (View-Only) (Signed)
   Progress Note  Patient Name: [name] Date of Encounter: 10/19/2023  Primary Cardiologist: Jayson Sierras, MD  Repeat echocardiogram is as follows:   1. Left ventricular ejection fraction, by estimation, is 70 to 75%. The  left ventricle has hyperdynamic function. The left ventricle has no  regional wall motion abnormalities. Left ventricular diastolic parameters  are consistent with Grade II diastolic  dysfunction (pseudonormalization). Elevated left atrial pressure. There is  the interventricular septum is flattened in systole and diastole,  consistent with right ventricular pressure and volume overload.   2. Right ventricular systolic function is mildly to moderately reduced.  The right ventricular size is mildly enlarged. There is severely elevated  pulmonary artery systolic pressure. The estimated right ventricular  systolic pressure is 77.4 mmHg.   3. The mitral valve is degenerative. Trivial mitral valve regurgitation.   4. Tricuspid valve regurgitation is mild to moderate.   5. The aortic valve is tricuspid. There is mild calcification of the  aortic valve. Aortic valve regurgitation is trivial. Mild aortic valve  stenosis. Aortic valve mean gradient measures 10.0 mmHg. Dimentionless  index 0.51.   6. The inferior vena cava is dilated in size with <50% respiratory  variability, suggesting right atrial pressure of 15 mmHg.   Met with patient and her daughter.  I would recommend transfer to [facility] in anticipation of a diagnostic right and left heart catheterization tomorrow.  Need to clarify pulmonary hypertension (WHO group 1 versus 2 versus 3) and also ensure no substantial progression in CAD as cause of her symptoms.  We discussed the risks and benefits and she is in agreement to proceed.  For questions or updates, please contact [facility] Please consult [email or web address] for contact info under   Signed, Jayson Sierras, MD  10/19/2023, 1:49  PM

## 2023-10-19 NOTE — Evaluation (Signed)
 Occupational Therapy Evaluation Patient Details Name: [name] MRN: [id number] DOB: February 07, 1955 Today's Date: 10/19/2023   History of Present Illness   [name] is a 68 year old female with history of anxiety, aortic stenosis, CAD non-STEMI s/p DES to dLAD, COPD, HTN, HLD, morbid obesity, ASO, DM II.... Presenting from pulmonology office for acute on chronic respiratory failure, increased nonproductive cough, congestion, shortness of breath, hypoxia, episodes of dizziness, near passing out spells while walking.  Shortness of breath with minimal exertion, symptoms progressively getting worse over past few days.  Patient was seen by her pulmonologist subsequently sent to the ED for evaluation of shortness of breath and and ruling out PE. (per MD)     Clinical Impressions Pt agreeable to OT and PT co-evaluation. Pt removed from supplemental O2 with saturation staying >90% throughout ambulation and movement in the room. Pt reported only wearing O2 to sleep at baseline. Pt demonstrates independence with bed mobility and ADL's. Pt also demonstrates WFL B UE functional use. Pt left in the bed with call bell within reach and removed from O2. Pt is not recommended for any further acute OT services and will be discharged to care of nursing staff for remaining length of stay.        Functional Status Assessment   Patient has not had a recent decline in their functional status     Equipment Recommendations   None recommended by OT            Precautions/Restrictions   Precautions Precautions: Fall Recall of Precautions/Restrictions: Intact Restrictions Weight Bearing Restrictions Per Provider Order: No     Mobility Bed Mobility Overal bed mobility: Independent                  Transfers Overall transfer level: Independent                        Balance Overall balance assessment: Independent                                          ADL either performed or assessed with clinical judgement   ADL Overall ADL's : Independent                                       General ADL Comments: No issues noted with pt being able to care for herself at home. WFL A/ROM. Ambulated in the room without assist off of supplemental O2.     Vision Baseline Vision/History: 1 Wears glasses Ability to See in Adequate Light: 0 Adequate Patient Visual Report: No change from baseline Vision Assessment?: No apparent visual deficits (glasses to drive at night)     Perception Perception: Not tested       Praxis Praxis: Not tested       Pertinent Vitals/Pain Pain Assessment Pain Assessment: No/denies pain     Extremity/Trunk Assessment Upper Extremity Assessment Upper Extremity Assessment: Generalized weakness;Overall [facility] for tasks assessed   Lower Extremity Assessment Lower Extremity Assessment: Defer to PT evaluation   Cervical / Trunk Assessment Cervical / Trunk Assessment: Normal   Communication Communication Communication: No apparent difficulties   Cognition Arousal: Alert Behavior During Therapy: WFL for tasks assessed/performed Cognition: No apparent impairments  Following commands: Intact       Cueing  General Comments   Cueing Techniques: Verbal cues                 Home Living Family/patient expects to be discharged to:: Private residence Living Arrangements: Alone Available Help at Discharge: Family;Available PRN/intermittently Type of Home: House Home Access: Stairs to enter [employer] of Steps: 5 Entrance Stairs-Rails: Left;Right;Can reach both Home Layout: One level     Bathroom Shower/Tub: Producer, television/film/video: Handicapped height Bathroom Accessibility: Yes How Accessible: Accessible via wheelchair;Accessible via walker Home Equipment: Grab bars - tub/shower;Shower seat - built in   Additional  Comments: Supplemental O2 at night.      Prior Functioning/Environment Prior Level of Function : Independent/Modified Independent;Driving;Working/employed             Mobility Comments: Tourist information centre manager without AD; drives; works ADLs Comments: Independent                            Co-evaluation PT/OT/SLP Co-Evaluation/Treatment: Yes Reason for Co-Treatment: To address functional/ADL transfers   OT goals addressed during session: ADL's and self-care                       End of Session    Activity Tolerance: Patient tolerated treatment well Patient left: in bed;with call bell/phone within reach  OT Visit Diagnosis: Unsteadiness on feet (R26.81);Other abnormalities of gait and mobility (R26.89)                Time: 8982-8971 OT Time Calculation (min): 11 min Charges:  OT General Charges $OT Visit: 1 Visit OT Evaluation $OT Eval Low Complexity: 1 Low  [name] OT, MOT   [name] 10/19/2023, 10:49 AM

## 2023-10-19 NOTE — Consult Note (Addendum)
 Cardiology Consultation   Patient ID: [name] MRN: [id number]; DOB: [birth year]  Admit date: 10/18/2023 Date of Consult: 10/19/2023  PCP:  [name], MD   [facility] Providers Cardiologist:  Jayson Sierras, MD     Patient Profile: [name] is a 68 y.o. female with a hx of CAD s/p anterior STEMI with DES to LAD in 2016, HLD, PSVT (Zio 2023: brief), mild AV stenosis (ECHO 10/2021), HTN, pulmonary hypertension (echo 2023 with RVSP of 67 mmHg), severe OSA (2020 sleep study), syncope who is being seen 10/19/2023 for the evaluation of CHF and worsening DOE at the request of Dr. Willette.  History of Present Illness: [name] was last seen in heart care 10/05/2023 by Dr. Sierras for follow-up on recent hospitalization for syncope.  Negative orthostatics in office.  Etiology was not clear, however with sudden onset suggest potential arrhythmia.  Ordered 14-day Zio patch and echo, which are both still pending.  Restarted Diovan  20 mg since hypotension less likely.  Continued on ASA 81 mg daily, Lipitor  40 mg every other day, Imdur  30 mg daily, Toprol -XL 25 mg in a.m. and 12.5 mg in the p.m, Lasix  20 mg daily.   Presents to [facility] ED 9/23 from pulmonary office for acute on chronic respiratory failure, increased nonproductive cough, congestion, SOB, hypoxia, episodes of dizziness, near passing out spells while walking. D-dimer elevated 3.12, K4, Mg 1.3 now 1.7, albumin  3.3, BNP elevated 373 now 545, TN negative 7 > 7, LDL 29, CBC WNL, negative viral respiratory panel EKG: NSR, HR 70, right axis deviation, nonspecific T wave abnormalities (no significant changes)  CXR with no acute findings CTA chest negative for PE but noted main pulmonary artery dilation likely reflecting underlying pulmonary arterial hypertension. ECHO pending. Treated with Lasix  IV 40 mg X2.  No I's/O documented.  Weight 210>205 lbs.  CR 0.86 > 0.82 Also treated with prophylactic heparin ,  Atrovent /Xopenex  nebulizer, Solu-Medrol , Toprol  XL 25 mg, mag sulfate IV.   On Interview, patient report recently being in [place] for work conference last week from Monday to Friday.  On Saturday evening, she woke up with chest tightness and shortness of breath that has progressively gotten worse since that time.  Reported SOB with minimal exertion that resolves with rest.  Noted that chest tightness is only associated with SOB. She suspected it was due to bronchitis and followed up with pulmonary there and was diverted to ED. Also, on Saturday patient was walking when she suddenly felt lightheaded and dizzy then sat down and suddenly passed out for unknown time.  Noted syncopal episode was similar to previous episodes.  Denies any associated chest pain or palpitations at that time.  Also noted LE edema since Saturday and a nonproductive cough.  Currently, patient reports significant improvement in SOB and LE edema with IV Lasix .  Reports chronic ongoing unchanged brief palpitations described as a double beat lasting less than 5 seconds and occurring about once a month.  Endorses low-sodium diet and drinks approximately 60 ounces of water  daily.  Reports daily medication compliance.   Past Medical History:  Diagnosis Date   Allergy    Codeine, Sulfur   Anxiety    Aortic stenosis    CAD (coronary artery disease)    a. 10/2014: Ant STEMI s/p DES to dLAD   Carotid disease, bilateral    a. Duplex 12/2014: mild-mod atherosclerotic plaque without hemodynamically significant stenosis.   Cataract 04/08/2016   CHF (congestive heart failure) (HCC) 08/27/2018  COPD (chronic obstructive pulmonary disease) (HCC)    Essential hypertension    Heart murmur 11/17/2014   HLD (hyperlipidemia)    Morbid obesity (HCC)    Myocardial infarction (HCC) 11/17/2014   Oxygen  deficiency 1996   1 LITER AT BEDTIME WITH BIPAP   PONV (postoperative nausea and vomiting)    Shingles    Sleep apnea    Uses CPAP   Type 2  diabetes mellitus [facility])     Past Surgical History:  Procedure Laterality Date   BREAST BIOPSY Left    2016   CARDIAC CATHETERIZATION N/A 11/17/2014   Procedure: Left Heart Cath and Coronary Angiography;  Surgeon: Deatrice DELENA Cage, MD;  Location: [facility] INVASIVE CV LAB;  Service: Cardiovascular;  Laterality: N/A;   CARDIAC CATHETERIZATION N/A 11/17/2014   Procedure: Coronary Stent Intervention;  Surgeon: Deatrice DELENA Cage, MD;  Location: [facility] INVASIVE CV LAB;  Service: Cardiovascular;  Laterality: N/A;  distal lad 2.25x20 promus   CATARACT EXTRACTION W/PHACO Left 09/20/2016   Procedure: CATARACT EXTRACTION PHACO AND INTRAOCULAR LENS PLACEMENT (IOC);  Surgeon: Perley Hamilton, MD;  Location: [facility] ORS;  Service: Ophthalmology;  Laterality: Left;  CDE: 9.27   CATARACT EXTRACTION W/PHACO Right 10/18/2016   Procedure: CATARACT EXTRACTION PHACO AND INTRAOCULAR LENS PLACEMENT (IOC);  Surgeon: Perley Hamilton, MD;  Location: [facility] ORS;  Service: Ophthalmology;  Laterality: Right;  CDE: 6.38   CHOLECYSTECTOMY     COLONOSCOPY WITH PROPOFOL  N/A 02/16/2021   Procedure: COLONOSCOPY WITH PROPOFOL ;  Surgeon: Cindie Carlin POUR, DO;  Location: [facility] ENDO [facility];  Service: Endoscopy;  Laterality: N/A;  1:30 / ASA 3   EYE SURGERY     POLYPECTOMY  02/16/2021   Procedure: POLYPECTOMY;  Surgeon: Cindie Carlin POUR, DO;  Location: [facility] END[facility];  Service: Endoscopy;;   SPINAL FUSION     cervical; screws and plates.   TOTAL ABDOMINAL HYSTERECTOMY  1980     Home Medications:  Prior to Admission medications   Medication Sig Start Date End Date Taking? Authorizing Provider  aspirin  EC 81 MG tablet Take 81 mg by mouth daily.   Yes [provider]  atorvastatin  (LIPITOR ) 40 MG tablet TAKE 1 TABLET (40 MG TOTAL) BY MOUTH EVERY OTHER DAY. TAKE AT [facility] 07/18/23  Yes [name], MD  budesonide -formoterol  (BREYNA ) 80-4.5 MCG/ACT inhaler Inhale 2 puffs into the lungs 2 (two) times daily.   Yes [provider]   escitalopram  (LEXAPRO ) 20 MG tablet TAKE 1 TABLET (20 MG TOTAL) BY MOUTH DAILY AT 6 (SIX) AM. 07/22/23 01/18/24 Yes [name], MD  FARXIGA  10 MG TABS tablet TAKE 1 TABLET BY MOUTH EVERY DAY 09/22/23  Yes [name], MD  isosorbide  mononitrate (IMDUR ) 30 MG 24 hr tablet TAKE 1 TABLET BY MOUTH EVERY DAY 06/21/23  Yes Debera Jayson MATSU, MD  metoprolol  succinate (TOPROL -XL) 25 MG 24 hr tablet TAKE 1.5 TABLETS BY MOUTH DAILY Patient taking differently: Take 25 mg by mouth See admin instructions. Take 1 tablet in the morning and 1/2 tablet every evening 01/24/23  Yes Debera Jayson MATSU, MD  nystatin  cream (MYCOSTATIN ) Apply 1 Application topically 2 (two) times daily. 10/01/22  Yes Antonetta Rollene BRAVO, MD  valsartan  (DIOVAN ) 40 MG tablet Take 0.5 tablets (20 mg total) by mouth daily. 10/05/23  Yes Debera Jayson MATSU, MD  Continuous Glucose Sensor (FREESTYLE LIBRE 3 PLUS SENSOR) MISC Change sensor every 15 days. 08/10/23   [name], MD    Scheduled Meds:  aspirin  EC  81  mg Oral Daily   atorvastatin   40 mg Oral QODAY   dapagliflozin  propanediol  10 mg Oral Daily   escitalopram   20 mg Oral Q0600   furosemide   40 mg Intravenous Q12H   heparin   5,000 Units Subcutaneous Q8H   insulin  aspart  0-15 Units Subcutaneous TID WC   ipratropium  0.5 mg Nebulization TID   isosorbide  mononitrate  30 mg Oral Daily   levalbuterol   0.63 mg Nebulization TID   metoprolol  succinate  25 mg Oral Daily   sodium chloride  flush  3 mL Intravenous Q12H   sodium chloride  flush  3 mL Intravenous Q12H   Continuous Infusions:  PRN Meds: acetaminophen  **OR** acetaminophen , bisacodyl , hydrALAZINE , HYDROmorphone  (DILAUDID ) injection, nystatin  cream, ondansetron  **OR** ondansetron  (ZOFRAN ) IV, oxyCODONE , senna-docusate, traZODone   Allergies:    Allergies  Allergen Reactions   Codeine Nausea And Vomiting   Morphine Nausea And Vomiting   Sulfa Antibiotics Rash    Social History:   Social History    Socioeconomic History   Marital status: Widowed    Spouse name: Not on file   Number of children: Not on file   Years of education: Not on file   Highest education level: Associate degree: occupational, Scientist, product/process development, or vocational program  Occupational History   Occupation: Office work for [employer]  Tobacco Use   Smoking status: Every Day    Current packs/day: 1.00    Average packs/day: 1 pack/day for 50.8 years (50.8 ttl pk-yrs)    Types: Cigarettes    Start date: 12/26/1972   Smokeless tobacco: Never   Tobacco comments:    3 cigs per day currently 10/18/23   Vaping Use   Vaping status: Never Used  Substance and Sexual Activity   Alcohol  use: No    Alcohol /week: 0.0 standard drinks of alcohol    Drug use: No   Sexual activity: Yes    Partners: Male    Birth control/protection: Surgical  Other Topics Concern   Not on file  Social History Narrative   Not on file   Family History:   Family History  Problem Relation Age of Onset   Heart attack Father    Heart disease Father    Cancer Mother    Heart disease Mother    Cancer Brother    Cancer Maternal Grandfather    Cancer Maternal Grandmother    Cancer Maternal Aunt    Cancer Maternal Aunt    Cancer Maternal Aunt    Cancer Maternal Aunt      ROS:  Please see the history of present illness.  All other ROS reviewed and negative.     Physical Exam/Data: Vitals:   10/18/23 2004 10/19/23 0042 10/19/23 0500 10/19/23 0610  BP: (!) 165/74 (!) 145/68  (!) 145/64  Pulse: 65 61  64  Resp: 20 18  17   Temp: 98 F (36.7 C) 98.1 F (36.7 C)  (!) 97.5 F (36.4 C)  TempSrc: Oral Oral    SpO2: 94% 95%  100%  Weight:   93.2 kg   Height:       No intake or output data in the 24 hours ending 10/19/23 0726    10/19/2023    5:00 AM 10/18/2023    4:27 PM 10/18/2023    9:49 AM  Last 3 Weights  Weight (lbs) 205 lb 7.5 oz 210 lb 5.1 oz 213 lb 9.6 oz  Weight (kg) 93.2 kg 95.4 kg 96.888 kg     Body mass index is  35.27  kg/m.  General:  Well nourished, well developed, in no acute distress HEENT: normal Neck: no JVD Vascular: No carotid bruits; Distal pulses 2+ bilaterally Cardiac:  normal S1, S2; RRR; 2/6 systolic murmur in LUSB Lungs:  clear to auscultation on left;  Abd: soft, nontender, no hepatomegaly  Ext: no edema Musculoskeletal:  No deformities, BUE and BLE strength normal and equal Skin: warm and dry  Neuro:  CNs 2-12 intact, no focal abnormalities noted Psych:  Normal affect   EKG:  The EKG was personally reviewed and demonstrates:  NSR, HR 70, right axis deviation, nonspecific T wave abnormalities (no significant changes)  Telemetry:  Telemetry was personally reviewed and demonstrates:  NSR/SB, HR 50-70, PAC/PVC's  Relevant CV Studies: Cath 2016 Prox Cx to Mid Cx lesion, 20% stenosed. Mid Cx lesion, 30% stenosed. Dist LAD lesion, 100% stenosed. Post intervention, there is a 0% residual stenosis. Mid RCA lesion, 30% stenosed. The left ventricular systolic function is normal.    1. Severe one-vessel coronary artery disease with an occluded distal LAD.  2. Normal LV systolic function with apical akinesis.  3. Successful angioplasty and drug-eluting stent placement to the distal LAD.    Recommendations:  Dual antiplatelet therapy for at least one year. Aggressive treatment of risk factors. Smoking cessation is strongly advised.  Lexiscan  2019  Narrative & Impression  T wave inversion was noted during stress in the II, III, aVF, V5 and V6 leads after lexiscan  injection. Overall nonspecific finding The study is normal. There are no perfusion defects consistent with prior infarct or current ischemia. This is a low risk study. The left ventricular ejection fraction is normal (55-65%).    ZIO XT reviewed 10/2021.  13 days, 23 hours analyzed.   Predominant rhythm is sinus with heart rate ranging from 49 bpm up to 133 bpm and average heart rate 69 bpm. There were rare PACs including  atrial couplets and triplets representing less than 1% total beats. There were rare PVCs including ventricular couplets and triplets representing less than 1% total beats. Multiple brief episodes of PSVT were noted, the longest of which was 18 beats in duration.  There was loose correlation between patient triggered events and PACs as well as PSVT. There were no sustained arrhythmias or pauses.  ECHO IMPRESSIONS 10/2021  1. Abnormal septal motion . Left ventricular ejection fraction, by  estimation, is 55 to 60%. The left ventricle has normal function. The left  ventricle has no regional wall motion abnormalities. Left ventricular  diastolic parameters were normal.   2. Right ventricular systolic function is normal. The right ventricular  size is normal. There is severely elevated pulmonary artery systolic  pressure.   3. Left atrial size was mildly dilated.   4. The mitral valve is abnormal. No evidence of mitral valve  regurgitation. No evidence of mitral stenosis.   5. Gradient lower than seen on TTE done 08/28/18. The aortic valve is  tricuspid. There is moderate calcification of the aortic valve. There is  moderate thickening of the aortic valve. Aortic valve regurgitation is not  visualized. Mild aortic valve  stenosis.   6. The inferior vena cava is dilated in size with >50% respiratory  variability, suggesting right atrial pressure of 8 mmHg.   Carotid US  IMPRESSION: 10/2021 1. Moderate long segment mixed echogenicity plaque of the proximal right ICA with Doppler measurements indicative of 50-69% stenosis. 2. Moderate calcified plaque of the left carotid bifurcation with Doppler measurements indicative of 50-69% stenosis   Laboratory Data:  High Sensitivity Troponin:   Recent Labs  Lab 09/28/23 1028 09/28/23 1235 10/18/23 1033 10/18/23 1234  TROPONINIHS 7 7 7 7      Chemistry Recent Labs  Lab 10/18/23 1033 10/18/23 1512 10/19/23 0408  NA 140  --  139  K 4.0  --   4.0  CL 101  --  96*  CO2 25  --  28  GLUCOSE 163*  --  275*  BUN 22  --  23  CREATININE 0.86  --  0.82  CALCIUM  8.8*  --  8.6*  MG 1.3* 1.7  --   GFRNONAA >60  --  >60  ANIONGAP 14  --  15    Recent Labs  Lab 10/18/23 1033  PROT 6.9  ALBUMIN  3.3*  AST 26  ALT 25  ALKPHOS 85  BILITOT 0.5   Lipids  Recent Labs  Lab 10/18/23 1512  CHOL 81  TRIG 100  HDL 32*  LDLCALC 29  CHOLHDL 2.5    Hematology Recent Labs  Lab 10/18/23 1033  WBC 6.6  RBC 4.45  HGB 13.0  HCT 41.7  MCV 93.7  MCH 29.2  MCHC 31.2  RDW 15.9*  PLT 228   Thyroid  No results for input(s): TSH, FREET4 in the last 168 hours.  BNP Recent Labs  Lab 10/18/23 1033 10/19/23 0408  BNP 373.0* 545.0*    DDimer  Recent Labs  Lab 10/18/23 1033  DDIMER 3.12*    Radiology/Studies:  CT Angio Chest PE W and/or Wo Contrast Result Date: 10/18/2023 CLINICAL DATA:  Pulmonary embolism (PE) suspected, high prob Pt arrived via POV from Dr Leisa office for evaluation of possible PE. Pt endorses SOB with exertion and recent travel to [place]. Pt endorses chest tightness as well EXAM: CT ANGIOGRAPHY CHEST WITH CONTRAST TECHNIQUE: Multidetector CT imaging of the chest was performed using the standard protocol during bolus administration of intravenous contrast. Multiplanar CT image reconstructions and MIPs were obtained to evaluate the vascular anatomy. RADIATION DOSE REDUCTION: This exam was performed according to the departmental dose-optimization program which includes automated exposure control, adjustment of the mA and/or kV according to patient size and/or use of iterative reconstruction technique. CONTRAST:  75mL OMNIPAQUE  IOHEXOL  350 MG/ML SOLN COMPARISON:  Chest XR, 10/18/2023.  CT chest, 09/28/2023 FINDINGS: Cardiovascular: Satisfactory opacification of the pulmonary arteries to the segmental level. No segmental or larger pulmonary embolus. Dilation of the main PA, measuring 3.6 cm. Normal heart size. No  pericardial effusion. Moderate burden of calcified coronary atherosclerosis, greatest within the LAD. Mediastinum/Nodes: No enlarged mediastinal, hilar, or axillary lymph nodes. Thyroid  gland, trachea, and esophagus demonstrate no significant findings. Lungs/Pleura: Lungs are clear without focal consolidation, mass or suspicious pulmonary nodule. No pleural effusion or pneumothorax. Upper Abdomen: No acute abnormality. Musculoskeletal: No acute chest wall abnormality. Postsurgical changes of cervical fusion, incompletely imaged. No acute osseous findings. Review of the MIP images confirms the above findings. IMPRESSION: 1. No segmental or larger pulmonary embolus. No acute intrathoracic abnormality. 2. Main pulmonary artery dilation, likely reflecting underlying pulmonary arterial hypertension. Electronically Signed   By: Thom Hall M.D.   On: 10/18/2023 13:09   DG Chest Port 1 View Result Date: 10/18/2023 CLINICAL DATA:  Dyspnea EXAM: PORTABLE CHEST 1 VIEW COMPARISON:  September 28, 2023 FINDINGS: No focal consolidation.  No pleural effusions.  No pneumothorax. Unchanged mildly enlarged cardiomediastinal silhouette likely exaggerated by AP technique. No acute osseous findings. IMPRESSION: No acute findings. Electronically Signed   By: Michaeline Tobie HERO.D.  On: 10/18/2023 11:20     Assessment and Plan: Possible acute on chronic HFpEF Acute on chronic respiratory failure with hypoxia Pulmonary Arterial HTN  Asthma Echo 2023 with RVSP of 66 mmHg. PFT 12/2022 showed C/W asthma and not COPD. Presented with worsening SOB associated with chest tightness, LE edema. BNP elevated 373 now 545.  D-dimer elevated 3.12.  CXR without acute findings. CTA negative for PE with possible pulmonary arterial hypertension. ECHO pending.  Treated with Lasix  IV 40 mg x2.  No I's/O documented.  Weight 210>205 lbs.  CR 0.86 > 0.82. Also treated with DuoNeb bronchodilator and IV steroids. Currently, patient reports  significant improvement in SOB and LE edema as well as good urine output with IV Lasix .  Urine output has not been measured.  Reds vest pending.  Will follow-up with nurse. Appears euvolemic on exam, however right lower lobe diminished. Currently on IV Lasix  40 mg twice daily.  Could consider converting to p.o. home Lasix  20 mg daily.  Continue Jardiance 10 mg daily, Imdur  30 mg daily, Toprol  XL 25 mg daily Suspect multifactorial etiology.  Based on echo results, will assess for changes in pulmonary pressure and determine if right heart cath is indicated.   Hypomagnesia Initially mag 1.3 then treated with IV mag.  This a.m. Mg 1.7.  Continue to monitor  Syncope  Zio 2023: Rare PACs/PVCs, multiple brief episodes of PSVT. Zio monitor pending.  Patient took monitor off but has not had time to send back yet. Encouraged to return ZIO monitor.  Reports a sudden syncopal episode episode with prodrome of lightheadedness and dizziness.  Denies any associated cardiac symptoms at that time. Tele: NSR/SB, HR 50-70's, PAC/PVC Unclear etiology but concern for arrhythmia with sudden onset. Continue to monitor telemetry.   CAD HLD S/p anterior STEMI with DES to LAD in 2016 Lexiscan  2019: Low risk study with no infarct or ischemia and EF 55 to 65%. Echo 2023 showed EF 55 to 60%, no RWMA, mildly dilated LA, dilated IVC. Echo pending.  Denies any anginal symptoms.  TN negative and EKG without ischemic changes.  No indications for ischemic evaluation at this time Continue on ASA 81 mg daily, Lipitor  40 mg every other day, Imdur  30 mg daily  Mild AV stenosis Echo 10/2021: Mild AV stenosis with moderate calcification/thickening of AV. Echo pending.  2/6 systolic murmur in LUSB.  Continue ASA and statin as above  HTN BP mildly elevated this a.m. 145/64. Continue Imdur , Toprol  XL  No repeat vitals since a.m. medication.  Continue to monitor If BP remains elevated would consider restarting Diovan  20  mg.  OSA 2020 sleep study: Severe OSA Reports daily compliance with CPAP.    Risk Assessment/Risk Scores:  [place]  Heart Association (NYHA) Functional Class NYHA Class III    For questions or updates, please contact [facility] Please consult [email or web address] for contact info under      Signed, Lorette CINDERELLA Kapur, PA-C  10/19/2023 7:26 AM

## 2023-10-19 NOTE — Evaluation (Signed)
 Physical Therapy Evaluation Patient Details Name: [name] MRN: [id number] DOB: 10-Jun-1955 Today's Date: 10/19/2023  History of Present Illness  [name] is a 68 year old female with history of anxiety, aortic stenosis, CAD non-STEMI s/p DES to dLAD, COPD, HTN, HLD, morbid obesity, ASO, DM II.... Presenting from pulmonology office for acute on chronic respiratory failure, increased nonproductive cough, congestion, shortness of breath, hypoxia, episodes of dizziness, near passing out spells while walking.  Shortness of breath with minimal exertion, symptoms progressively getting worse over past few days.  Patient was seen by her pulmonologist subsequently sent to the ED for evaluation of shortness of breath and and ruling out PE.   Clinical Impression  Pt was agreeable to today's PT and OT co-evaluation.Pt was independent with all bed mobility and functional mobility. Pt appears to be at baseline and does not require skilled PT at this time. Patient discharged to care of nursing for ambulation daily as tolerated for length of stay.       If plan is discharge home, recommend the following:     Can travel by private vehicle        Equipment Recommendations None recommended by PT  Recommendations for Other Services       Functional Status Assessment Patient has not had a recent decline in their functional status     Precautions / Restrictions Precautions Precautions: Fall Recall of Precautions/Restrictions: Intact Restrictions Weight Bearing Restrictions Per Provider Order: No      Mobility  Bed Mobility Overal bed mobility: Independent                  Transfers Overall transfer level: Independent                      Ambulation/Gait Ambulation/Gait assistance: Independent Gait Distance (Feet): 250 Feet   Gait Pattern/deviations: WFL(Within Functional Limits) Gait velocity: WFL     General Gait Details: no significant gait deviations  observed  Stairs            Wheelchair Mobility     Tilt Bed    Modified Rankin (Stroke Patients Only)       Balance Overall balance assessment: Independent                                           Pertinent Vitals/Pain Pain Assessment Pain Assessment: No/denies pain    Home Living Family/patient expects to be discharged to:: Private residence Living Arrangements: Alone Available Help at Discharge: Family;Available PRN/intermittently Type of Home: House Home Access: Stairs to enter Entrance Stairs-Rails: Left;Right;Can reach both Entrance Stairs-Number of Steps: 5   Home Layout: One level Home Equipment: Grab bars - tub/shower;Shower seat - built in Additional Comments: Supplemental O2 at night.    Prior Function Prior Level of Function : Independent/Modified Independent;Driving;Working/employed             Mobility Comments: Tourist information centre manager without AD; drives; works ADLs Comments: Independent     Extremity/Trunk Assessment   Upper Extremity Assessment Upper Extremity Assessment: Defer to OT evaluation    Lower Extremity Assessment Lower Extremity Assessment: Overall WFL for tasks assessed    Cervical / Trunk Assessment Cervical / Trunk Assessment: Normal  Communication   Communication Communication: No apparent difficulties    Cognition Arousal: Alert Behavior During Therapy: WFL for tasks assessed/performed   PT - Cognitive impairments: No apparent  impairments                         Following commands: Intact       Cueing Cueing Techniques: Verbal cues     General Comments      Exercises     Assessment/Plan    PT Assessment Patient does not need any further PT services  PT Problem List         PT Treatment Interventions      PT Goals (Current goals can be found in the Care Plan section)  Acute Rehab PT Goals Patient Stated Goal: return home PT Goal Formulation: With patient Time For  Goal Achievement: 10/26/23 Potential to Achieve Goals: Good    Frequency       Co-evaluation PT/OT/SLP Co-Evaluation/Treatment: Yes Reason for Co-Treatment: To address functional/ADL transfers PT goals addressed during session: Mobility/safety with mobility;Balance OT goals addressed during session: ADL's and self-care       AM-PAC PT 6 Clicks Mobility  Outcome Measure Help needed turning from your back to your side while in a flat bed without using bedrails?: None Help needed moving from lying on your back to sitting on the side of a flat bed without using bedrails?: None Help needed moving to and from a bed to a chair (including a wheelchair)?: None Help needed standing up from a chair using your arms (e.g., wheelchair or bedside chair)?: None Help needed to walk in hospital room?: None Help needed climbing 3-5 steps with a railing? : None 6 Click Score: 24    End of Session   Activity Tolerance: Patient tolerated treatment well Patient left: in bed;with call bell/phone within reach        Time: 1014-1028 PT Time Calculation (min) (ACUTE ONLY): 14 min   Charges:   PT Evaluation $PT Eval Low Complexity: 1 Low PT Treatments $Therapeutic Activity: 8-22 mins PT General Charges $$ ACUTE PT VISIT: 1 Visit         Lacinda Fass, PT, DPT  10/19/2023, 11:40 AM

## 2023-10-19 NOTE — Progress Notes (Signed)
 PROGRESS NOTE    Patient: [name]                            PCP: [name], MD                    DOB: [birth year]            DOA: 10/18/2023 FMW:[id number]             DOS: 10/19/2023, 2:12 PM   LOS: 1 day   Date of Service: The patient was seen and examined on 10/19/2023  Subjective:   The patient was seen and examined this morning. Hemodynamically stable.... Was tapered off supplemental oxygen  currently satting 94% room air.  Denies any chest pain, only shortness of breath with exertion No issues overnight .  Brief Narrative:   [name] is a 68 year old female with history of anxiety, aortic stenosis, CAD non-STEMI s/p DES to dLAD, COPD, HTN, HLD, morbid obesity, ASO, DM II.... Presenting from pulmonology office for acute on chronic respiratory failure, increased nonproductive cough, congestion, shortness of breath, hypoxia, episodes of dizziness, near passing out spells while walking.  Shortness of breath with minimal exertion, symptoms progressively getting worse over past few days. Patient was seen by her pulmonologist subsequently sent to the ED for evaluation of shortness of breath and and ruling out PE.  ED evaluation: Patient was satting 88-89% with ambulation and 2 L of oxygen  Blood pressure (!) 126/56, pulse 70, temperature 98.4 F (36.9 C), temperature source Oral, resp. rate (!) 24, height 5' 4 (1.626 m), weight 96.9 kg, SpO2 94% 2 L of oxygen .  LABs: CBC, CMP reviewed within normal limits with exception of glucose 163, calcium  8.1, magnesium  1.3, D-dimer elevated 3.12.  Respiratory panel RSV, influenza A/B, COVID all negative VBG: pH 7.43, pCO2 43, PO249, bicarb 28.5 BNP 373  CTA chest: Negative for PE, 2. Main pulmonary artery dilation, likely reflecting underlying pulmonary arterial hypertension.  Requested for patient to admitted for acute on chronic respiratory failure possibly CHF exacerbation with COPD exacerbation    Assessment & Plan:    Principal Problem:   Acute on chronic respiratory failure with hypoxia (HCC) Active Problems:   Acute exacerbation of CHF (congestive heart failure) (HCC)   Anxiety   Carotid artery stenosis   Essential hypertension   Depression   GERD (gastroesophageal reflux disease)   CAD (coronary artery disease)   COPD (chronic obstructive pulmonary disease) (HCC)   Mixed hyperlipidemia   Aortic stenosis   Type 2 diabetes mellitus with hyperglycemia (HCC)   DOE (dyspnea on exertion)   Obesity, Class I, BMI 30.0-34.9 (see actual BMI)   Hypomagnesemia     Assessment and Plan: * Acute on chronic respiratory failure with hypoxia (HCC) - Multifactorial, COPD exacerbation with possible CHF with exacerbation -CT angiogram rule out PE, possible pulmonary artery hypertension -Successfully weaned off supplemental oxygen , currently satting -Initiating diuretics Lasix  40 mg IV twice daily - Will follow-up with echocardiogram, daily BMP, Reds clip reading -Will monitor I's and O's, daily weight  - Will continue DuoNeb bronchodilators every 6 hours, IV steroids, -Encourage incentive spirometer -Continue supplemental oxygen  with goal to taper off -Continue as needed mucolytics, spirometer, flutter valve,  Acute exacerbation of CHF (congestive heart failure) (HCC) - Monitoring I's and O's, daily weight, -IV diuretic Lasix  -Echocardiogram >> EJ F 70-75%, borderline concentric LVH, Grade II diastolic dysfunction. severely elevated pulmonary artery systolic  pressure. - Cardiologist Dr. Debera recommended patient to be transferred to [facility] for cardiac cath for further evaluation in am 9/25 - BNP 373.0 >>>>  545.0  Carotid artery stenosis - Continue aspirin , statins  Anxiety - Currently stable, as needed Xanax   GERD (gastroesophageal reflux disease)- Continue PPI  Depression- Continue home medication of Lexapro   Essential hypertension Reviewing and resuming home medication of Imdur ,  metoprolol , Holding Diovan , Patient will be on Lasix  40 mg IV twice daily   Hypomagnesemia Serum magnesium  1.3, repleted with 2 g of IV magnesium  Monitoring  Obesity, Class I, BMI 30.0-34.9 (see actual BMI) Body mass index is 36.66 kg/m. - Recommended patient to follow-up with the PCP, with aggressive weight loss program -Dietary control increase exercise recommended  DOE (dyspnea on exertion) - Monitoring closely, obtaining echocardiogram, diuresing -Based on CTA rule out PE -Consult PT OT for evaluation  Type 2 diabetes mellitus with hyperglycemia (HCC) - Holding home regimen, continuing Farxiga  -Checking CBG q. ACHS, SSI coverage -Last A1c 6.9 >> 7.3  Aortic stenosis - Aortic stenosis pulmonary hypertension -Maintain blood pressure check, diuresing -Follow-up with outpatient with cardiology and vascular  Mixed hyperlipidemia- Continue statins  COPD (chronic obstructive pulmonary disease) (HCC) - Follow-up with your primary pulmonologist Dr. Neomi for further evaluation and recommendations - Holding home inhalers, continue with DuoNeb every 6 hours -Encouraging incentive spirometer and flutter valve - IV steroids -will discontinue today  CAD (coronary artery disease) - Reviewing and continue home medication; including aspirin , statins, beta-blocker - Cardiology recommending cardiac cath-transferring to [facility] for further evaluation ----------------------------------------------------------------------------------------------------------------------------------------------- Nutritional status:  The patient's BMI is: Body mass index is 35.27 kg/m. I agree with the assessment and plan as outlined   DVT prophylaxis:  heparin  injection 5,000 Units Start: 10/18/23 2200 SCDs Start: 10/18/23 1452   Code Status:   Code Status: Full Code  Family Communication: No family member present at bedside-  -Advance care planning has been discussed.   Admission status:   Status is:  Inpatient Remains inpatient appropriate because: Needing continuous cardiac evaluation possible cardiac cath in a.m. IV diuretics,    Disposition: From  - home             Planning for discharge in 1-2 days   Procedures:   No admission procedures for hospital encounter.   Antimicrobials:  Anti-infectives (From admission, onward)    None        Medication:   aspirin  EC  81 mg Oral Daily   atorvastatin   40 mg Oral QODAY   dapagliflozin  propanediol  10 mg Oral Daily   escitalopram   20 mg Oral Q0600   furosemide   40 mg Intravenous Q12H   heparin   5,000 Units Subcutaneous Q8H   insulin  aspart  0-15 Units Subcutaneous TID WC   ipratropium  0.5 mg Nebulization TID   isosorbide  mononitrate  30 mg Oral Daily   levalbuterol   0.63 mg Nebulization TID   metoprolol  succinate  25 mg Oral Daily   sodium chloride  flush  3 mL Intravenous Q12H   sodium chloride  flush  3 mL Intravenous Q12H    acetaminophen  **OR** acetaminophen , bisacodyl , hydrALAZINE , HYDROmorphone  (DILAUDID ) injection, nystatin  cream, ondansetron  **OR** ondansetron  (ZOFRAN ) IV, oxyCODONE , perflutren  lipid microspheres (DEFINITY ) IV suspension, senna-docusate, traZODone    Objective:   Vitals:   10/19/23 0500 10/19/23 0610 10/19/23 0733 10/19/23 1322  BP:  (!) 145/64  (!) 125/53  Pulse:  64  63  Resp:  17  18  Temp:  (!) 97.5 F (36.4 C)  (!)  97.5 F (36.4 C)  TempSrc:    Oral  SpO2:  100% 97% 94%  Weight: 93.2 kg     Height:        Intake/Output Summary (Last 24 hours) at 10/19/2023 1412 Last data filed at 10/19/2023 1300 Gross per 24 hour  Intake 480 ml  Output --  Net 480 ml   Filed Weights   10/18/23 0949 10/18/23 1627 10/19/23 0500  Weight: 96.9 kg 95.4 kg 93.2 kg     Physical examination:   General:  AAO x 3,  cooperative, no distress;   HEENT:  Normocephalic, PERRL, otherwise with in Normal limits   Neuro:  CNII-XII intact. , normal motor and sensation, reflexes intact   Lungs:   Clear to  auscultation BL, Respirations unlabored,  No wheezes / crackles  Cardio:    S1/S2, RRR, No murmure, No Rubs or Gallops   Abdomen:  Soft, non-tender, bowel sounds active all four quadrants, no guarding or peritoneal signs.  Muscular  skeletal:  Limited exam -global generalized weaknesses - in bed, able to move all 4 extremities,   2+ pulses,  symmetric, No pitting edema  Skin:  Dry, warm to touch, negative for any Rashes,  Wounds: Please see nursing documentation       ------------------------------------------------------------------------------------------------------------------------------------------    LABs:     Latest Ref Rng & Units 10/18/2023   10:33 AM 09/28/2023   10:28 AM 03/14/2023    5:13 AM  CBC  WBC 4.0 - 10.5 K/uL 6.6  7.6  4.6   Hemoglobin 12.0 - 15.0 g/dL 86.9  86.5  87.3   Hematocrit 36.0 - 46.0 % 41.7  42.4  40.0   Platelets 150 - 400 K/uL 228  232  182       Latest Ref Rng & Units 10/19/2023    4:08 AM 10/18/2023   10:33 AM 09/28/2023   10:28 AM  CMP  Glucose 70 - 99 mg/dL 724  836  747   BUN 8 - 23 mg/dL 23  22  19    Creatinine 0.44 - 1.00 mg/dL 9.17  9.13  9.20   Sodium 135 - 145 mmol/L 139  140  138   Potassium 3.5 - 5.1 mmol/L 4.0  4.0  3.9   Chloride 98 - 111 mmol/L 96  101  99   CO2 22 - 32 mmol/L 28  25  24    Calcium  8.9 - 10.3 mg/dL 8.6  8.8  9.0   Total Protein 6.5 - 8.1 g/dL  6.9  7.0   Total Bilirubin 0.0 - 1.2 mg/dL  0.5  0.7   Alkaline Phos 38 - 126 U/L  85  69   AST 15 - 41 U/L  26  25   ALT 0 - 44 U/L  25  22        Micro Results Recent Results (from the past 240 hours)  Resp panel by RT-PCR (RSV, Flu A&B, Covid) Anterior Nasal Swab     Status: None   Collection Time: 10/18/23 10:14 AM   Specimen: Anterior Nasal Swab  Result Value Ref Range Status   SARS Coronavirus 2 by RT PCR NEGATIVE NEGATIVE Final    Comment: (NOTE) SARS-CoV-2 target nucleic acids are NOT DETECTED.  The SARS-CoV-2 RNA is generally detectable in upper  respiratory specimens during the acute phase of infection. The lowest concentration of SARS-CoV-2 viral copies this assay can detect is 138 copies/mL. A negative result does not preclude SARS-Cov-2 infection and should not  be used as the sole basis for treatment or other patient management decisions. A negative result may occur with  improper specimen collection/handling, submission of specimen other than nasopharyngeal swab, presence of viral mutation(s) within the areas targeted by this assay, and inadequate number of viral copies(<138 copies/mL). A negative result must be combined with clinical observations, patient history, and epidemiological information. The expected result is Negative.  Fact Sheet for Patients:  BloggerCourse.com  Fact Sheet for Healthcare Providers:  SeriousBroker.it  This test is no t yet approved or cleared by the [place]  FDA and  has been authorized for detection and/or diagnosis of SARS-CoV-2 by FDA under an Emergency Use Authorization (EUA). This EUA will remain  in effect (meaning this test can be used) for the duration of the COVID-19 declaration under Section 564(b)(1) of the Act, 21 U.S.C.section 360bbb-3(b)(1), unless the authorization is terminated  or revoked sooner.       Influenza A by PCR NEGATIVE NEGATIVE Final   Influenza B by PCR NEGATIVE NEGATIVE Final    Comment: (NOTE) The Xpert Xpress SARS-CoV-2/FLU/RSV plus assay is intended as an aid in the diagnosis of influenza from Nasopharyngeal swab specimens and should not be used as a sole basis for treatment. Nasal washings and aspirates are unacceptable for Xpert Xpress SARS-CoV-2/FLU/RSV testing.  Fact Sheet for Patients: BloggerCourse.com  Fact Sheet for Healthcare Providers: SeriousBroker.it  This test is not yet approved or cleared by the [place]  FDA and has been  authorized for detection and/or diagnosis of SARS-CoV-2 by FDA under an Emergency Use Authorization (EUA). This EUA will remain in effect (meaning this test can be used) for the duration of the COVID-19 declaration under Section 564(b)(1) of the Act, 21 U.S.C. section 360bbb-3(b)(1), unless the authorization is terminated or revoked.     Resp Syncytial Virus by PCR NEGATIVE NEGATIVE Final    Comment: (NOTE) Fact Sheet for Patients: BloggerCourse.com  Fact Sheet for Healthcare Providers: SeriousBroker.it  This test is not yet approved or cleared by the [place]  FDA and has been authorized for detection and/or diagnosis of SARS-CoV-2 by FDA under an Emergency Use Authorization (EUA). This EUA will remain in effect (meaning this test can be used) for the duration of the COVID-19 declaration under Section 564(b)(1) of the Act, 21 U.S.C. section 360bbb-3(b)(1), unless the authorization is terminated or revoked.  Performed at [facility], [address] [place], [place] [address]     Radiology Reports ECHOCARDIOGRAM COMPLETE Result Date: 10/19/2023    ECHOCARDIOGRAM REPORT   Patient Name:   GALILEE PIERRON Date of Exam: 10/19/2023 Medical Rec #:  [id number]      Height:       64.0 in Accession #:    [id number]     Weight:       205.5 lb Date of Birth:  [birth year]      BSA:          1.979 m Patient Age:    67 years       BP:           145/64 mmHg Patient Gender: F              HR:           64 bpm. Exam Location:  [facility] Procedure: 2D Echo, Cardiac Doppler, Color Doppler, Strain Analysis and            Intracardiac Opacification Agent (Both Spectral and Color Flow  Doppler were utilized during procedure). Indications:    CHF- Acute Diastolic l50.31  History:        Patient has prior history of Echocardiogram examinations, most                 recent 11/22/2021. CHF, Previous Myocardial Infarction and CAD,                 COPD;  Risk Factors:Hypertension, Diabetes, Dyslipidemia, Current                 Smoker and Sleep Apnea.  Sonographer:    Aida Pizza RCS Referring Phys: [phone] Lateka Rady A Simya Tercero  Sonographer Comments: Global longitudinal strain was attempted. IMPRESSIONS  1. Left ventricular ejection fraction, by estimation, is 70 to 75%. The left ventricle has hyperdynamic function. The left ventricle has no regional wall motion abnormalities. Left ventricular diastolic parameters are consistent with Grade II diastolic dysfunction (pseudonormalization). Elevated left atrial pressure. There is the interventricular septum is flattened in systole and diastole, consistent with right ventricular pressure and volume overload.  2. Right ventricular systolic function is mildly to moderately reduced. The right ventricular size is mildly enlarged. There is severely elevated pulmonary artery systolic pressure. The estimated right ventricular systolic pressure is 77.4 mmHg.  3. The mitral valve is degenerative. Trivial mitral valve regurgitation.  4. Tricuspid valve regurgitation is mild to moderate.  5. The aortic valve is tricuspid. There is mild calcification of the aortic valve. Aortic valve regurgitation is trivial. Mild aortic valve stenosis. Aortic valve mean gradient measures 10.0 mmHg. Dimentionless index 0.51.  6. The inferior vena cava is dilated in size with <50% respiratory variability, suggesting right atrial pressure of 15 mmHg. Comparison(s): Prior images reviewed side by side. LVEF 70-75% with moderate diastolic dysfunction. Severely elevated estimated RVSP of 77 mmHg with RV dysfunction. Mild aortic stenosis. Mild to moderate tricuspid regurgitation. FINDINGS  Left Ventricle: Left ventricular ejection fraction, by estimation, is 70 to 75%. The left ventricle has hyperdynamic function. The left ventricle has no regional wall motion abnormalities. Definity  contrast agent was given IV to delineate the left ventricular endocardial  borders. The left ventricular internal cavity size was normal in size. There is borderline concentric left ventricular hypertrophy. The interventricular septum is flattened in systole and diastole, consistent with right ventricular pressure and volume overload. Left ventricular diastolic parameters are consistent with Grade II diastolic dysfunction (pseudonormalization). Elevated left atrial pressure. Right Ventricle: The right ventricular size is mildly enlarged. No increase in right ventricular wall thickness. Right ventricular systolic function is mildly reduced. There is severely elevated pulmonary artery systolic pressure. The tricuspid regurgitant velocity is 3.95 m/s, and with an assumed right atrial pressure of 15 mmHg, the estimated right ventricular systolic pressure is 77.4 mmHg. Left Atrium: Left atrial size was normal in size. Right Atrium: Right atrial size was normal in size. Pericardium: There is no evidence of pericardial effusion. Presence of epicardial fat layer. Mitral Valve: The mitral valve is degenerative in appearance. Mild to moderate mitral annular calcification. Trivial mitral valve regurgitation. Tricuspid Valve: The tricuspid valve is grossly normal. Tricuspid valve regurgitation is mild to moderate. Aortic Valve: The aortic valve is tricuspid. There is mild calcification of the aortic valve. There is mild aortic valve annular calcification. Aortic valve regurgitation is trivial. Mild aortic stenosis is present. Aortic valve mean gradient measures 10.0 mmHg. Aortic valve peak gradient measures 21.2 mmHg. Aortic valve area, by VTI measures 1.16 cm. Pulmonic Valve: The pulmonic valve was grossly normal.  Pulmonic valve regurgitation is mild. Aorta: The aortic root is normal in size and structure. Venous: The inferior vena cava is dilated in size with less than 50% respiratory variability, suggesting right atrial pressure of 15 mmHg. IAS/Shunts: No atrial level shunt detected by color flow  Doppler. Additional Comments: 3D was performed not requiring image post processing on an independent workstation and was indeterminate.  LEFT VENTRICLE PLAX 2D LVIDd:         4.00 cm   Diastology LVIDs:         2.30 cm   LV e' medial:    6.84 cm/s LV PW:         1.00 cm   LV E/e' medial:  16.5 LV IVS:        1.00 cm   LV e' lateral:   8.45 cm/s LVOT diam:     1.70 cm   LV E/e' lateral: 13.4 LV SV:         57 LV SV Index:   29 LVOT Area:     2.27 cm  RIGHT VENTRICLE RV S prime:     11.80 cm/s TAPSE (M-mode): 1.8 cm LEFT ATRIUM             Index        RIGHT ATRIUM           Index LA diam:        3.80 cm 1.92 cm/m   RA Area:     13.80 cm LA Vol (A2C):   28.9 ml 14.60 ml/m  RA Volume:   32.60 ml  16.47 ml/m LA Vol (A4C):   40.9 ml 20.67 ml/m LA Biplane Vol: 35.5 ml 17.94 ml/m  AORTIC VALVE AV Area (Vmax):    1.13 cm AV Area (Vmean):   1.31 cm AV Area (VTI):     1.16 cm AV Vmax:           230.00 cm/s AV Vmean:          143.000 cm/s AV VTI:            0.486 m AV Peak Grad:      21.2 mmHg AV Mean Grad:      10.0 mmHg LVOT Vmax:         114.00 cm/s LVOT Vmean:        82.800 cm/s LVOT VTI:          0.249 m LVOT/AV VTI ratio: 0.51  AORTA Ao Root diam: 3.00 cm MITRAL VALVE                TRICUSPID VALVE MV Area (PHT): 5.02 cm     TR Peak grad:   62.4 mmHg MV Decel Time: 151 msec     TR Vmax:        395.00 cm/s MV E velocity: 113.00 cm/s MV A velocity: 55.50 cm/s   SHUNTS MV E/A ratio:  2.04         Systemic VTI:  0.25 m                             Systemic Diam: 1.70 cm Jayson Sierras MD Electronically signed by Jayson Sierras MD Signature Date/Time: 10/19/2023/1:32:04 PM    Final     SIGNED: Adriana DELENA Grams, MD, FHM. FAAFP. [name] - [facility] Time spent - 55 min.  In seeing, evaluating and examining the patient. Reviewing medical records, labs, drawn plan of care.  [facility],  Pager (please use [email or web address] to page/ text) Please use Epic Secure Chat for non-urgent communication  (7AM-7PM)  If 7PM-7AM, please contact night-coverage [email or web address], 10/19/2023, 2:12 PM

## 2023-10-19 NOTE — Progress Notes (Signed)
   Progress Note  Patient Name: [name] Date of Encounter: 10/19/2023  Primary Cardiologist: Jayson Sierras, MD  Repeat echocardiogram is as follows:   1. Left ventricular ejection fraction, by estimation, is 70 to 75%. The  left ventricle has hyperdynamic function. The left ventricle has no  regional wall motion abnormalities. Left ventricular diastolic parameters  are consistent with Grade II diastolic  dysfunction (pseudonormalization). Elevated left atrial pressure. There is  the interventricular septum is flattened in systole and diastole,  consistent with right ventricular pressure and volume overload.   2. Right ventricular systolic function is mildly to moderately reduced.  The right ventricular size is mildly enlarged. There is severely elevated  pulmonary artery systolic pressure. The estimated right ventricular  systolic pressure is 77.4 mmHg.   3. The mitral valve is degenerative. Trivial mitral valve regurgitation.   4. Tricuspid valve regurgitation is mild to moderate.   5. The aortic valve is tricuspid. There is mild calcification of the  aortic valve. Aortic valve regurgitation is trivial. Mild aortic valve  stenosis. Aortic valve mean gradient measures 10.0 mmHg. Dimentionless  index 0.51.   6. The inferior vena cava is dilated in size with <50% respiratory  variability, suggesting right atrial pressure of 15 mmHg.   Met with patient and her daughter.  I would recommend transfer to [facility] in anticipation of a diagnostic right and left heart catheterization tomorrow.  Need to clarify pulmonary hypertension (WHO group 1 versus 2 versus 3) and also ensure no substantial progression in CAD as cause of her symptoms.  We discussed the risks and benefits and she is in agreement to proceed.  For questions or updates, please contact [facility] Please consult [email or web address] for contact info under   Signed, Jayson Sierras, MD  10/19/2023, 1:49  PM

## 2023-10-19 NOTE — Progress Notes (Addendum)
*  PRELIMINARY RESULTS* Echocardiogram 2D Echocardiogram has been performed with Definity .  [name] 10/19/2023, 1:12 PM

## 2023-10-20 ENCOUNTER — Encounter (HOSPITAL_COMMUNITY)
Admission: EM | Disposition: A | Discharge status: Home / Self Care | Payer: Self-pay | Source: Ambulatory Visit | Attending: Internal Medicine

## 2023-10-20 DIAGNOSIS — I272 Pulmonary hypertension, unspecified: Secondary | ICD-10-CM | POA: Diagnosis not present

## 2023-10-20 DIAGNOSIS — I50813 Acute on chronic right heart failure: Secondary | ICD-10-CM | POA: Diagnosis not present

## 2023-10-20 DIAGNOSIS — I2510 Atherosclerotic heart disease of native coronary artery without angina pectoris: Secondary | ICD-10-CM | POA: Diagnosis not present

## 2023-10-20 DIAGNOSIS — I350 Nonrheumatic aortic (valve) stenosis: Secondary | ICD-10-CM | POA: Diagnosis not present

## 2023-10-20 DIAGNOSIS — J9621 Acute and chronic respiratory failure with hypoxia: Secondary | ICD-10-CM | POA: Diagnosis not present

## 2023-10-20 HISTORY — PX: RIGHT/LEFT HEART CATH AND CORONARY ANGIOGRAPHY: CATH118266

## 2023-10-20 LAB — POCT I-STAT 7, (LYTES, BLD GAS, ICA,H+H)
Acid-Base Excess: 6 mmol/L — ABNORMAL HIGH (ref 0.0–2.0)
Acid-Base Excess: 6 mmol/L — ABNORMAL HIGH (ref 0.0–2.0)
Bicarbonate: 31.8 mmol/L — ABNORMAL HIGH (ref 20.0–28.0)
Bicarbonate: 31.9 mmol/L — ABNORMAL HIGH (ref 20.0–28.0)
Calcium, Ion: 1.08 mmol/L — ABNORMAL LOW (ref 1.15–1.40)
Calcium, Ion: 1.13 mmol/L — ABNORMAL LOW (ref 1.15–1.40)
HCT: 39 % (ref 36.0–46.0)
HCT: 41 % (ref 36.0–46.0)
Hemoglobin: 13.3 g/dL (ref 12.0–15.0)
Hemoglobin: 13.9 g/dL (ref 12.0–15.0)
O2 Saturation: 81 %
O2 Saturation: 86 %
Potassium: 3.3 mmol/L — ABNORMAL LOW (ref 3.5–5.1)
Potassium: 3.5 mmol/L (ref 3.5–5.1)
Sodium: 140 mmol/L (ref 135–145)
Sodium: 141 mmol/L (ref 135–145)
TCO2: 33 mmol/L — ABNORMAL HIGH (ref 22–32)
TCO2: 33 mmol/L — ABNORMAL HIGH (ref 22–32)
pCO2 arterial: 47.4 mmHg (ref 32–48)
pCO2 arterial: 50 mmHg — ABNORMAL HIGH (ref 32–48)
pH, Arterial: 7.412 (ref 7.35–7.45)
pH, Arterial: 7.435 (ref 7.35–7.45)
pO2, Arterial: 46 mmHg — ABNORMAL LOW (ref 83–108)
pO2, Arterial: 50 mmHg — ABNORMAL LOW (ref 83–108)

## 2023-10-20 LAB — POCT I-STAT EG7
Acid-Base Excess: 7 mmol/L — ABNORMAL HIGH (ref 0.0–2.0)
Acid-base deficit: 9 mmol/L — ABNORMAL HIGH (ref 0.0–2.0)
Bicarbonate: 18.9 mmol/L — ABNORMAL LOW (ref 20.0–28.0)
Bicarbonate: 32.8 mmol/L — ABNORMAL HIGH (ref 20.0–28.0)
Calcium, Ion: 0.59 mmol/L — CL (ref 1.15–1.40)
Calcium, Ion: 1.06 mmol/L — ABNORMAL LOW (ref 1.15–1.40)
HCT: 37 % (ref 36.0–46.0)
HCT: 39 % (ref 36.0–46.0)
Hemoglobin: 12.6 g/dL (ref 12.0–15.0)
Hemoglobin: 13.3 g/dL (ref 12.0–15.0)
O2 Saturation: 55 %
O2 Saturation: 56 %
Potassium: 3.3 mmol/L — ABNORMAL LOW (ref 3.5–5.1)
Potassium: <2 mmol/L — CL (ref 3.5–5.1)
Sodium: 122 mmol/L — ABNORMAL LOW (ref 135–145)
Sodium: 141 mmol/L (ref 135–145)
TCO2: 20 mmol/L — ABNORMAL LOW (ref 22–32)
TCO2: 34 mmol/L — ABNORMAL HIGH (ref 22–32)
pCO2, Ven: 46.3 mmHg (ref 44–60)
pCO2, Ven: 52.7 mmHg (ref 44–60)
pH, Ven: 7.219 — ABNORMAL LOW (ref 7.25–7.43)
pH, Ven: 7.402 (ref 7.25–7.43)
pO2, Ven: 29 mmHg — CL (ref 32–45)
pO2, Ven: 35 mmHg (ref 32–45)

## 2023-10-20 LAB — BASIC METABOLIC PANEL WITH GFR
Anion gap: 14 (ref 5–15)
BUN: 23 mg/dL (ref 8–23)
CO2: 26 mmol/L (ref 22–32)
Calcium: 9 mg/dL (ref 8.9–10.3)
Chloride: 98 mmol/L (ref 98–111)
Creatinine, Ser: 0.81 mg/dL (ref 0.44–1.00)
GFR, Estimated: >60 mL/min (ref >60)
Glucose, Bld: 174 mg/dL — ABNORMAL HIGH (ref 70–99)
Potassium: 3.6 mmol/L (ref 3.5–5.1)
Sodium: 138 mmol/L (ref 135–145)

## 2023-10-20 LAB — GLUCOSE, CAPILLARY
Glucose-Capillary: 189 mg/dL — ABNORMAL HIGH (ref 70–99)
Glucose-Capillary: 142 mg/dL — ABNORMAL HIGH (ref 70–99)
Glucose-Capillary: 202 mg/dL — ABNORMAL HIGH (ref 70–99)
Glucose-Capillary: 153 mg/dL — ABNORMAL HIGH (ref 70–99)

## 2023-10-20 LAB — CBC
HCT: 43.4 % (ref 36.0–46.0)
Hemoglobin: 13.8 g/dL (ref 12.0–15.0)
MCH: 29 pg (ref 26.0–34.0)
MCHC: 31.8 g/dL (ref 30.0–36.0)
MCV: 91.2 fL (ref 80.0–100.0)
Platelets: 201 K/uL (ref 150–400)
RBC: 4.76 MIL/uL (ref 3.87–5.11)
RDW: 15.9 % — ABNORMAL HIGH (ref 11.5–15.5)
WBC: 6.8 K/uL (ref 4.0–10.5)
nRBC: 0 % (ref 0.0–0.2)

## 2023-10-20 LAB — HIV ANTIBODY (ROUTINE TESTING W REFLEX): HIV Screen 4th Generation wRfx: NONREACTIVE

## 2023-10-20 LAB — CG4 I-STAT (LACTIC ACID): Lactic Acid, Venous: 0.6 mmol/L (ref 0.5–1.9)

## 2023-10-20 SURGERY — RIGHT/LEFT HEART CATH AND CORONARY ANGIOGRAPHY
Anesthesia: LOCAL

## 2023-10-20 MED ORDER — LABETALOL HCL 5 MG/ML IV SOLN: 10.0000 mg | INTRAVENOUS | Status: AC | PRN

## 2023-10-20 MED ORDER — FUROSEMIDE 10 MG/ML IJ SOLN
80.0000 mg | Freq: Two times a day (BID) | INTRAMUSCULAR | Status: DC
Start: 2023-10-20 — End: 2023-10-22
  Administered 2023-10-20 – 2023-10-22 (×4): 80 mg via INTRAVENOUS
  Filled 2023-10-20 (×3): qty 8

## 2023-10-20 MED ORDER — LIDOCAINE HCL (PF) 1 % IJ SOLN
INTRAMUSCULAR | Status: AC
  Filled 2023-10-20: qty 30

## 2023-10-20 MED ORDER — HEPARIN SODIUM (PORCINE) 1000 UNIT/ML IJ SOLN
INTRAMUSCULAR | Status: DC | PRN
  Administered 2023-10-20: 5000 [IU] via INTRA_ARTERIAL

## 2023-10-20 MED ORDER — MIDAZOLAM HCL 2 MG/2ML IJ SOLN
INTRAMUSCULAR | Status: DC | PRN
  Administered 2023-10-20: 1 mg via INTRAVENOUS

## 2023-10-20 MED ORDER — VERAPAMIL HCL 2.5 MG/ML IV SOLN
INTRAVENOUS | Status: DC | PRN
  Administered 2023-10-20: 10 mL via INTRA_ARTERIAL

## 2023-10-20 MED ORDER — FUROSEMIDE 10 MG/ML IJ SOLN
80.0000 mg | Freq: Two times a day (BID) | INTRAMUSCULAR | Status: DC
  Filled 2023-10-20: qty 8

## 2023-10-20 MED ORDER — HYDRALAZINE HCL 20 MG/ML IJ SOLN: 10.0000 mg | INTRAMUSCULAR | Status: AC | PRN

## 2023-10-20 MED ORDER — SODIUM CHLORIDE 0.9 % IV SOLN: 250.0000 mL | INTRAVENOUS | Status: DC | PRN

## 2023-10-20 MED ORDER — VERAPAMIL HCL 2.5 MG/ML IV SOLN
INTRAVENOUS | Status: AC
  Filled 2023-10-20: qty 2

## 2023-10-20 MED ORDER — LIDOCAINE HCL (PF) 1 % IJ SOLN
INTRAMUSCULAR | Status: DC | PRN
  Administered 2023-10-20: 5 mL

## 2023-10-20 MED ORDER — SODIUM CHLORIDE 0.9% FLUSH
3.0000 mL | Freq: Two times a day (BID) | INTRAVENOUS | Status: DC
  Administered 2023-10-20: 3 mL via INTRAVENOUS

## 2023-10-20 MED ORDER — HEPARIN (PORCINE) IN NACL 1000-0.9 UT/500ML-% IV SOLN
INTRAVENOUS | Status: DC | PRN
  Administered 2023-10-20 (×2): 500 mL

## 2023-10-20 MED ORDER — ACETAMINOPHEN 325 MG PO TABS
650.0000 mg | ORAL_TABLET | ORAL | Status: DC | PRN
  Administered 2023-10-21: 650 mg via ORAL
  Filled 2023-10-20 (×2): qty 2

## 2023-10-20 MED ORDER — IOHEXOL 350 MG/ML SOLN
INTRAVENOUS | Status: DC | PRN
  Administered 2023-10-20: 25 mL

## 2023-10-20 MED ORDER — FENTANYL CITRATE (PF) 100 MCG/2ML IJ SOLN
INTRAMUSCULAR | Status: AC
  Filled 2023-10-20: qty 2

## 2023-10-20 MED ORDER — MIDAZOLAM HCL 2 MG/2ML IJ SOLN
INTRAMUSCULAR | Status: AC
  Filled 2023-10-20: qty 2

## 2023-10-20 MED ORDER — FENTANYL CITRATE (PF) 100 MCG/2ML IJ SOLN
INTRAMUSCULAR | Status: DC | PRN
  Administered 2023-10-20: 25 ug via INTRAVENOUS

## 2023-10-20 MED ORDER — SODIUM CHLORIDE 0.9% FLUSH: 3.0000 mL | INTRAVENOUS | Status: DC | PRN

## 2023-10-20 MED ORDER — HEPARIN SODIUM (PORCINE) 1000 UNIT/ML IJ SOLN
INTRAMUSCULAR | Status: AC
  Filled 2023-10-20: qty 10

## 2023-10-20 SURGICAL SUPPLY — 12 items
CATH BALLN WEDGE 5F 110CM (CATHETERS) IMPLANT
CATH INFINITI 5FR ANG PIGTAIL (CATHETERS) IMPLANT
CATH INFINITI AMBI 6FR TG (CATHETERS) IMPLANT
CATH SWAN GANZ 7F STRAIGHT (CATHETERS) IMPLANT
DEVICE RAD COMP TR BAND LRG (VASCULAR PRODUCTS) IMPLANT
GLIDESHEATH SLEND SS 6F .021 (SHEATH) IMPLANT
GLIDESHEATH SLENDER 7FR .021G (SHEATH) IMPLANT
PACK CARDIAC CATHETERIZATION (CUSTOM PROCEDURE TRAY) ×1 IMPLANT
SET ATX-X65L (MISCELLANEOUS) IMPLANT
SHEATH GLIDE SLENDER 4/5FR (SHEATH) IMPLANT
WIRE EMERALD 3MM-J .025X260CM (WIRE) IMPLANT
WIRE EMERALD 3MM-J .035X260CM (WIRE) IMPLANT

## 2023-10-20 NOTE — TOC Initial Note (Addendum)
 Transition of Care [facility]) - Initial/Assessment Note    Patient Details  Name: [name] MRN: [id number] Date of Birth: 12-Mar-1955  Transition of Care [facility]) CM/SW Contact:    Sudie Erminio Deems, RN Phone Number: 10/20/2023, 1:07 PM  Clinical Narrative: Patient presented for shortness of breath-plan for [facility] today. PTA patient was from home alone. Patient has support of daughter and she was at the bedside during the visit. Patient has DME CPAP in the home. Patient has PCP and insurance; has no issues with transportation or obtaining medications. No home needs identified at this time. Inpatient Case Manager will continue to follow.                Expected Discharge Plan: Home/Self Care Barriers to Discharge: No Barriers Identified   Patient Goals and CMS Choice Patient states their goals for this hospitalization and ongoing recovery are:: Plan for home once stable.   Choice offered to / list presented to : NA      Expected Discharge Plan and Services In-house Referral: NA Discharge Planning Services: CM Consult Post Acute Care Choice: NA Living arrangements for the past 2 months: Single Family Home                   DME Agency: NA  Prior Living Arrangements/Services Living arrangements for the past 2 months: Single Family Home Lives with:: Self Patient language and need for interpreter reviewed:: Yes Do you feel safe going back to the place where you live?: Yes      Need for Family Participation in Patient Care: No (Comment) Care giver support system in place?: No (comment)   Criminal Activity/Legal Involvement Pertinent to Current Situation/Hospitalization: No - Comment as needed  Activities of Daily Living   ADL Screening (condition at time of admission) Independently performs ADLs?: (P) Yes (appropriate for developmental age) Is the patient deaf or have difficulty hearing?: (P) No Does the patient have difficulty seeing, even when wearing  glasses/contacts?: (P) No Does the patient have difficulty concentrating, remembering, or making decisions?: (P) No  Permission Sought/Granted Permission sought to share information with : Family Supports, Case Manager    Emotional Assessment Appearance:: Appears stated age Attitude/Demeanor/Rapport: Engaged Affect (typically observed): Appropriate Orientation: : Oriented to Self, Oriented to Place, Oriented to  Time Alcohol  / Substance Use: Not Applicable Psych Involvement: No (comment)  Admission diagnosis:  Acute exacerbation of CHF (congestive heart failure) (HCC) [I50.9] Acute respiratory failure with hypoxia (HCC) [J96.01] Acute on chronic congestive heart failure, unspecified heart failure type (HCC) [I50.9] Patient Active Problem List   Diagnosis Date Noted   Pulmonary hypertension, unspecified (HCC) 10/20/2023   Acute exacerbation of CHF (congestive heart failure) (HCC) 10/18/2023   Hypomagnesemia 10/18/2023   Pulmonary artery hypertension (HCC) 08/10/2023   GERD (gastroesophageal reflux disease) 03/13/2023   Obesity, Class I, BMI 30.0-34.9 (see actual BMI) 03/13/2023   Arthritis 02/09/2023   Carpal tunnel syndrome 02/09/2023   Depression 02/09/2023   Kidney stone 02/09/2023   Psoriasis 02/09/2023   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   Vitamin D  deficiency 02/09/2023   Solitary pulmonary nodule on lung CT 11/09/2022   Asthmatic bronchitis , chronic (HCC) 11/09/2022   DOE (dyspnea on exertion) 01/06/2022   Carotid artery stenosis 11/22/2021   Type 2 diabetes mellitus with hyperglycemia (HCC) 11/21/2021   Prolonged QT interval 11/21/2021   Sleep apnea 11/21/2021   H/O total hysterectomy 11/05/2021   Acute diastolic CHF (congestive heart failure) (HCC)  08/27/2018   Acute on chronic respiratory failure with hypoxia (HCC) 08/27/2018   Pleural effusion due to CHF (congestive heart failure) (HCC) 08/27/2018   Anxiety 08/25/2018    CAD (coronary artery disease)    COPD (chronic obstructive pulmonary disease) (HCC)    Essential hypertension    Cigarette smoker    Mixed hyperlipidemia    Aortic stenosis    ST elevation (STEMI) myocardial infarction involving left anterior descending coronary artery (HCC) 11/17/2014   PCP:  [name], MD Pharmacy:   [facility] [phone] - [place], [place] - [address] AT [facility] [address] [place] [address] Phone: [phone] Fax: [phone]  Social Drivers of Health (SDOH) Social History: SDOH Screenings   Food Insecurity: No Food Insecurity (08/06/2023)  Housing: Low Risk  (08/06/2023)  Transportation Needs: No Transportation Needs (08/06/2023)  Utilities: Not At Risk (03/13/2023)  Depression (PHQ2-9): Low Risk  (08/10/2023)  Financial Resource Strain: Low Risk  (08/06/2023)  Physical Activity: Insufficiently Active (08/06/2023)  Social Connections: Moderately Integrated (08/06/2023)  Stress: No Stress Concern Present (08/06/2023)  Tobacco Use: High Risk (10/18/2023)   Readmission Risk Interventions    10/19/2023    1:14 PM  Readmission Risk Prevention Plan  Medication Screening Complete  Transportation Screening Complete

## 2023-10-20 NOTE — Plan of Care (Signed)

## 2023-10-20 NOTE — Consult Note (Addendum)
 Advanced Heart Failure Team Consult Note   Primary Physician: [name], MD Cardiologist:  Jayson Sierras, MD  Reason for Consultation: Pulmonary HTN  HPI:    [name] is seen today for evaluation of pulmonary HTN at the request of Dr. Mona.   68 y/o female smoker w/ h/o chronic asthmatic bronchitis followed by Dr. Darlean, OSA on CPAP, Obesity, PH w/ associated RV dysfunction, CAD s/p anterior STEMI w/ DES to LAD in 2016, b/l carotid artery dz.   Echo in 2022 showed normal LVEF 60-65, RV mildly enlarged, mildly reduced SFx, D-shaped interventricular septum, estimated RVSP 57 mmHg.   PFTs 12/24 c/w minimal OAD, + restriction c/w body habitus and moderate diffusion defect.   Sent from pulmonary clinic to the ED given complaint of worsening SOB w/ minimal exertion and a/c hypoxic respiratory failure and numerous near syncopal spells. Initial O2 sats were in the upper 80s. Had also endorsed recent travel to [facility] prompting PE r/o. D-dimer was elevated at 3.12 but chest CT negative for PE. Study did show dilation of main pulmonary artery c/w underlying PH. Respiratory panel negative. BNP 373.   2D Echo showed hyperdynamic LV function, EF 70-755, GIIDD, interventricular septum is flattened in systole and diastole consistent with right ventricular pressure and volume overload, RV mildly enlarged w/ mild-moderately reduced SFx, estimated RVSP 77 mmHg, mild-mod TR, IVC dilated  with <50% respiratory variability suggesting right atrial pressure of 15 mmHg. AS only mild, mean gradient 10 mmHg.   Subsequent R/LHC done today showed mild, nonobstructive coronary artery disease with patent mid to distal LAD stent. 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   Systemic BPs 140s-150s/70s. Periodic bradycardia, HR upper 50s-low 60s w/ occasional dips to the 40s.   Current O2  sats 98% on RA.   AHF team consulted for further assistance.       2D Echo 10/19/23 1. Left ventricular ejection fraction, by estimation, is 70 to 75%. The  left ventricle has hyperdynamic function. The left ventricle has no  regional wall motion abnormalities. Left ventricular diastolic parameters  are consistent with Grade II diastolic  dysfunction (pseudonormalization). Elevated left atrial pressure. There is  the interventricular septum is flattened in systole and diastole,  consistent with right ventricular pressure and volume overload.   2. Right ventricular systolic function is mildly to moderately reduced.  The right ventricular size is mildly enlarged. There is severely elevated  pulmonary artery systolic pressure. The estimated right ventricular  systolic pressure is 77.4 mmHg.   3. The mitral valve is degenerative. Trivial mitral valve regurgitation.   4. Tricuspid valve regurgitation is mild to moderate.   5. The aortic valve is tricuspid. There is mild calcification of the  aortic valve. Aortic valve regurgitation is trivial. Mild aortic valve  stenosis. Aortic valve mean gradient measures 10.0 mmHg. Dimentionless  index 0.51.   6. The inferior vena cava is dilated in size with <50% respiratory  variability, suggesting right atrial pressure of 15 mmHg.   [facility] 10/20/23    Prox LAD lesion is 10% stenosed.   Mid RCA lesion is 30% stenosed.   Previously placed Mid LAD to Dist LAD stent of unknown type is  widely patent.   1.  Mild, nonobstructive coronary artery disease with patent mid to distal LAD stent. 2.  Fick cardiac output of  3.4 liters per minute and Fick cardiac index of 1.7 L/min/m      TD cardiac output of 3.7 L/min and TD cardiac output of 1.9 L/min/m                       RA pressure mean of 22 mmHg                       RV pressure 114/4 with an end-diastolic pressure of 24 mmHg                       PA pressure 114/35 with a mean PA pressure of 64  mmHg                       Wedge pressure mean of 26 mmHg                       PA pulsatility index of 3.6                       PVR of 11.2 Woods units by Fick; 10.3 Woods units by TD 3.  LVEDP of 29 mmHg 4.  Breathing room air the patient's noninvasive oxygen  saturation was in the 80s; ABG drawn in the cardiac catheterization laboratory demonstrated a pH of 7.4, pCO2 of 50, and PaO2 of 46   Summary: Elevated biventricular filling pressures with wedge of 26 mmHg, RA of 22 mmHg, and PVR of greater than 10 Wood units.    Home Medications Prior to Admission medications   Medication Sig Start Date End Date Taking? Authorizing Provider  aspirin  EC 81 MG tablet Take 81 mg by mouth daily.   Yes [provider]  atorvastatin  (LIPITOR ) 40 MG tablet TAKE 1 TABLET (40 MG TOTAL) BY MOUTH EVERY OTHER DAY. TAKE AT [facility] 07/18/23  Yes [name], MD  budesonide -formoterol  (BREYNA ) 80-4.5 MCG/ACT inhaler Inhale 2 puffs into the lungs 2 (two) times daily.   Yes [provider]  escitalopram  (LEXAPRO ) 20 MG tablet TAKE 1 TABLET (20 MG TOTAL) BY MOUTH DAILY AT 6 (SIX) AM. 07/22/23 01/18/24 Yes [name], MD  FARXIGA  10 MG TABS tablet TAKE 1 TABLET BY MOUTH EVERY DAY 09/22/23  Yes [name], MD  isosorbide  mononitrate (IMDUR ) 30 MG 24 hr tablet TAKE 1 TABLET BY MOUTH EVERY DAY 06/21/23  Yes Debera Jayson MATSU, MD  metoprolol  succinate (TOPROL -XL) 25 MG 24 hr tablet TAKE 1.5 TABLETS BY MOUTH DAILY Patient taking differently: Take 25 mg by mouth See admin instructions. Take 1 tablet in the morning and 1/2 tablet every evening 01/24/23  Yes Debera Jayson MATSU, MD  nystatin  cream (MYCOSTATIN ) Apply 1 Application topically 2 (two) times daily. 10/01/22  Yes Antonetta Rollene BRAVO, MD  valsartan  (DIOVAN ) 40 MG tablet Take 0.5 tablets (20 mg total) by mouth daily. 10/05/23  Yes Debera Jayson MATSU, MD  Continuous Glucose Sensor (FREESTYLE LIBRE 3 PLUS SENSOR) MISC Change sensor every 15 days.  08/10/23   [name], MD    Past Medical History: Past Medical History:  Diagnosis Date   Allergy    Codeine, Sulfur   Anxiety    Aortic stenosis    CAD (coronary artery disease)    a. 10/2014: Ant STEMI s/p DES to dLAD   Carotid disease, bilateral    a. Duplex 12/2014: mild-mod atherosclerotic plaque without  hemodynamically significant stenosis.   Cataract 04/08/2016   CHF (congestive heart failure) (HCC) 08/27/2018   COPD (chronic obstructive pulmonary disease) (HCC)    Essential hypertension    Heart murmur 11/17/2014   HLD (hyperlipidemia)    Morbid obesity (HCC)    Myocardial infarction (HCC) 11/17/2014   Oxygen  deficiency 1996   1 LITER AT BEDTIME WITH BIPAP   PONV (postoperative nausea and vomiting)    Shingles    Sleep apnea    Uses CPAP   Type 2 diabetes mellitus [facility])     Past Surgical History: Past Surgical History:  Procedure Laterality Date   BREAST BIOPSY Left    2016   CARDIAC CATHETERIZATION N/A 11/17/2014   Procedure: Left Heart Cath and Coronary Angiography;  Surgeon: Deatrice DELENA Cage, MD;  Location: [facility] INVASIVE CV LAB;  Service: Cardiovascular;  Laterality: N/A;   CARDIAC CATHETERIZATION N/A 11/17/2014   Procedure: Coronary Stent Intervention;  Surgeon: Deatrice DELENA Cage, MD;  Location: [facility] INVASIVE CV LAB;  Service: Cardiovascular;  Laterality: N/A;  distal lad 2.25x20 promus   CATARACT EXTRACTION W/PHACO Left 09/20/2016   Procedure: CATARACT EXTRACTION PHACO AND INTRAOCULAR LENS PLACEMENT (IOC);  Surgeon: Perley Hamilton, MD;  Location: [facility] ORS;  Service: Ophthalmology;  Laterality: Left;  CDE: 9.27   CATARACT EXTRACTION W/PHACO Right 10/18/2016   Procedure: CATARACT EXTRACTION PHACO AND INTRAOCULAR LENS PLACEMENT (IOC);  Surgeon: Perley Hamilton, MD;  Location: [facility] ORS;  Service: Ophthalmology;  Laterality: Right;  CDE: 6.38   CHOLECYSTECTOMY     COLONOSCOPY WITH PROPOFOL  N/A 02/16/2021   Procedure: COLONOSCOPY WITH PROPOFOL ;  Surgeon: Cindie Carlin POUR, DO;   Location: [facility] ENDO SUITE;  Service: Endoscopy;  Laterality: N/A;  1:30 / ASA 3   EYE SURGERY     POLYPECTOMY  02/16/2021   Procedure: POLYPECTOMY;  Surgeon: Cindie Carlin POUR, DO;  Location: [facility] ENDO SUITE;  Service: Endoscopy;;   SPINAL FUSION     cervical; screws and plates.   TOTAL ABDOMINAL HYSTERECTOMY  1980    Family History: Family History  Problem Relation Age of Onset   Heart attack Father    Heart disease Father    Cancer Mother    Heart disease Mother    Cancer Brother    Cancer Maternal Grandfather    Cancer Maternal Grandmother    Cancer Maternal Aunt    Cancer Maternal Aunt    Cancer Maternal Aunt    Cancer Maternal Aunt     Social History: Social History   Socioeconomic History   Marital status: Widowed    Spouse name: Not on file   Number of children: Not on file   Years of education: Not on file   Highest education level: Associate degree: occupational, Scientist, product/process development, or vocational program  Occupational History   Occupation: Office work for [employer]  Tobacco Use   Smoking status: Every Day    Current packs/day: 1.00    Average packs/day: 1 pack/day for 50.8 years (50.8 ttl pk-yrs)    Types: Cigarettes    Start date: 12/26/1972   Smokeless tobacco: Never   Tobacco comments:    3 cigs per day currently 10/18/23   Vaping Use   Vaping status: Never Used  Substance and Sexual Activity   Alcohol  use: No    Alcohol /week: 0.0 standard drinks of alcohol    Drug use: No   Sexual activity: Yes    Partners: Male    Birth control/protection: Surgical  Other Topics Concern   Not on file  Social History Narrative   Not on file   Social Drivers of Health   Financial Resource Strain: Low Risk  (08/06/2023)   Overall Financial Resource Strain (CARDIA)    Difficulty of Paying Living Expenses: Not hard at all  Food Insecurity: No Food Insecurity (08/06/2023)   Hunger Vital Sign    Worried About Running Out of Food in the Last Year: Never true    Ran  Out of Food in the Last Year: Never true  Transportation Needs: No Transportation Needs (08/06/2023)   PRAPARE - Administrator, Civil Service (Medical): No    Lack of Transportation (Non-Medical): No  Physical Activity: Insufficiently Active (08/06/2023)   Exercise Vital Sign    Days of Exercise per Week: 2 days    Minutes of Exercise per Session: 30 min  Stress: No Stress Concern Present (08/06/2023)   [employer] of Occupational Health - Occupational Stress Questionnaire    Feeling of Stress: Not at all  Social Connections: Moderately Integrated (08/06/2023)   Social Connection and Isolation Panel    Frequency of Communication with Friends and Family: More than three times a week    Frequency of Social Gatherings with Friends and Family: More than three times a week    Attends Religious Services: More than 4 times per year    Active Member of [employer] or Organizations: Yes    Attends Banker Meetings: More than 4 times per year    Marital Status: Widowed    Allergies:  Allergies  Allergen Reactions   Codeine Nausea And Vomiting   Morphine Nausea And Vomiting   Sulfa Antibiotics Rash    Objective:    Vital Signs:   Temp:  [97.8 F (36.6 C)-98.2 F (36.8 C)] 97.9 F (36.6 C) (09/25 1200) Pulse Rate:  [43-65] 63 (09/25 1414) Resp:  [18-20] 18 (09/25 1414) BP: (121-155)/(52-71) 147/70 (09/25 1414) SpO2:  [91 %-99 %] 97 % (09/25 1414) FiO2 (%):  [36 %] 36 % (09/25 0010) Weight:  [92 kg] 92 kg (09/25 0425) Last BM Date : 10/18/23  Weight change: Filed Weights   10/18/23 1627 10/19/23 0500 10/20/23 0425  Weight: 95.4 kg 93.2 kg 92 kg    Intake/Output:   Intake/Output Summary (Last 24 hours) at 10/20/2023 1432 Last data filed at 10/20/2023 0906 Gross per 24 hour  Intake 500 ml  Output 1900 ml  Net -1400 ml      Physical Exam    General:  fatigued appearing, obese, no respiratory difficulty at rest Neck: JVD 16 cm  Cor: RRR, no  MRG Lungs: decreased BS at the bases bilaterally  Abdomen: obese, soft, nontender, nondistended.  Extremities: no cyanosis, clubbing, rash, edema Neuro: alert & orientedx3, cranial nerves grossly intact. moves all 4 extremities w/o difficulty. Affect pleasant   Telemetry   NSR/SB upper 50s-low 60s w/ occasional PVCs, personally reviewed   EKG    N/A   Labs   Basic Metabolic Panel: Recent Labs  Lab 10/18/23 1033 10/18/23 1512 10/19/23 0408 10/20/23 0852  NA 140  --  139 138  K 4.0  --  4.0 3.6  CL 101  --  96* 98  CO2 25  --  28 26  GLUCOSE 163*  --  275* 174*  BUN 22  --  23 23  CREATININE 0.86  --  0.82 0.81  CALCIUM  8.8*  --  8.6* 9.0  MG 1.3* 1.7  --   --   PHOS  --  4.2  --   --     Liver Function Tests: Recent Labs  Lab 10/18/23 1033  AST 26  ALT 25  ALKPHOS 85  BILITOT 0.5  PROT 6.9  ALBUMIN  3.3*   No results for input(s): LIPASE, AMYLASE in the last 168 hours. No results for input(s): AMMONIA in the last 168 hours.  CBC: Recent Labs  Lab 10/18/23 1033 10/20/23 0852  WBC 6.6 6.8  NEUTROABS 4.6  --   HGB 13.0 13.8  HCT 41.7 43.4  MCV 93.7 91.2  PLT 228 201    Cardiac Enzymes: No results for input(s): CKTOTAL, CKMB, CKMBINDEX, TROPONINI in the last 168 hours.  BNP: BNP (last 3 results) Recent Labs    10/18/23 1033 10/19/23 0408  BNP 373.0* 545.0*    ProBNP (last 3 results) No results for input(s): PROBNP in the last 8760 hours.   CBG: Recent Labs  Lab 10/19/23 1102 10/19/23 1714 10/19/23 2020 10/20/23 0800 10/20/23 1151  GLUCAP 203* 382* 273* 189* 142*    Coagulation Studies: No results for input(s): LABPROT, INR in the last 72 hours.   Imaging   CARDIAC CATHETERIZATION Result Date: 10/20/2023   Prox LAD lesion is 10% stenosed.   Mid RCA lesion is 30% stenosed.   Previously placed Mid LAD to Dist LAD stent of unknown type is  widely patent. 1.  Mild, nonobstructive coronary artery disease with  patent mid to distal LAD stent. 2.  Fick cardiac output of 3.4 liters per minute and Fick cardiac index of 1.7 L/min/m      TD cardiac output of 3.7 L/min and TD cardiac output of 1.9 L/min/m   RA pressure mean of 22 mmHg   RV pressure 114/4 with an end-diastolic pressure of 24 mmHg   PA pressure 114/35 with a mean PA pressure of 64 mmHg   Wedge pressure mean of 26 mmHg   PA pulsatility index of 3.6   PVR of 11.2 Woods units by Fick; 10.3 Woods units by TD 3.  LVEDP of 29 mmHg 4.  Breathing room air the patient's noninvasive oxygen  saturation was in the 80s; ABG drawn in the cardiac catheterization laboratory demonstrated a pH of 7.4, pCO2 of 50, and PaO2 of 46 Summary: Elevated biventricular filling pressures with wedge of 26 mmHg, RA of 22 mmHg, and PVR of greater than 10 Wood units.  Recommend advanced heart failure consultation.     Medications:     Current Medications:  aspirin  EC  81 mg Oral Daily   atorvastatin   40 mg Oral QODAY   dapagliflozin  propanediol  10 mg Oral Daily   escitalopram   20 mg Oral Q0600   furosemide   40 mg Intravenous Q12H   heparin   5,000 Units Subcutaneous Q8H   insulin  aspart  0-15 Units Subcutaneous TID WC   ipratropium  0.5 mg Nebulization TID   isosorbide  mononitrate  30 mg Oral Daily   levalbuterol   0.63 mg Nebulization TID   metoprolol  succinate  25 mg Oral Daily   sodium chloride  flush  3 mL Intravenous Q12H   sodium chloride  flush  3 mL Intravenous Q12H    Infusions:     Patient Profile   68 y/o female smoker w/ h/o chronic asthmatic bronchitis followed by Dr. Darlean, OSA on CPAP, Obesity, PH w/ associated RV dysfunction, CAD s/p anterior STEMI w/ DES to LAD in 2026, b/l carotid artery dz admitted for acute hypoxic respiratory failure and found to have a/c HFpEF w/ prominent RV dysfunction  in the setting of severe PAH.   Assessment/Plan   1. Severe PH  - RHC c/w severe pre and post capillary PH>>RA 22, PAP 114/35, PCW 26, PVR 11.2 WU, PAPi 3.6  (unreliable in setting of high PAP). - would benefit from pulmonary vasodilators (sildenafil / tadalafil) but will need to d/c Imdur  and allow for washout first - diurese w/ IV Lasix  80 mg bid. If difficulties diuresing, may need milrinone support  - CT negative for PE but needs V/Q scan to r/o CTEPH  - obtain CTD serologies (Rheum Factor, ANA, Anti Scleroderma Antibody, Centromere Antibodies) - check HIV  - continue CPAP for OSA   - PFTs 12/24 demonstrated restriction and moderate diffusion defect. Minimal OAD. Plan HRCT to check for ILD  - repeat RHC hemodynamics after diuresis and addition of vasodilators   2. HFpEF w/ Prominent RV Dysfunction  - Echo EF 70-75%, GIIDD, interventricular septum is flattened in systole and diastole consistent with right ventricular pressure and volume overload, RV mildly enlarged w/ mild-moderately reduced SFx, estimated RVSP 77 mmHg, mild-mod TR, IVC dilated  with <50% respiratory variability suggesting right atrial pressure of 15 mmHg - NYHA III-IIIb on admission  - RHC today w/ elevated biventricular filling pressures and low CO, in setting of severe PAH   RA 22, PAP 114/35, PCW 26, CI 1.9 by TD, 1.7 by FICK - Diurese w/ IV Laix 80 mg bid - RV afterload reduction w/ PH treatment per above - stop ? blocker w/ low output  - avoid digoxin given periodic bradycardia, HR occasional drops to the 40s  - on SGLT2i, continue Farxiga  10 mg daily   3. CAD - h/o STEMI in 2016 treated w/ LAD stent - LHC today w/ nonobstructive dz and patent LAD stent  - continue medical management   4. OSA/OHS - continue CPAP  - PFTs 12/24 demonstrated restriction c/w body habitus. Needs wt loss. Would benefit from GLP1    5. Tobacco Use - smoker x 30+ years, trying to quit, now down to 1/4 ppd - encouraged cessation   6. Type 2DM -  Hgb A1c  7.3 - w/ DM + OSA and obesity would benefit from GLP1  7. Bilateral Carotid Artery Disease - 50- 69% pRICA and 50-69% LICA stenosis  on last assessment 10/23 - overdue for repeat evaluation. Recommend repeat study  - continue ASA 81 + statin  + tight BP control   Length of Stay: 2  Caffie Shed, PA-C  10/20/2023, 2:32 PM    Advanced Heart Failure Team Pager [phone] (M-F; 7a - 5p)  Please contact [facility] for night-coverage after hours (4p -7a ) and weekends on [email or web address]  Patient seen with PA, I formulated the plan and agree with the above note.   She has a long history of exertional dyspnea and was admitted with dyspnea and near-syncopal episodes. Troponin normal.   Echo was done showing EF 70-75% with D-shaped interventricular septum, mild RV enlargement with mild-moderate RV dysfunction, PASP 77 mmHg, dilated IVC, mild aortic stenosis.     RHC/LHC was done today, showing elevated right and left heart filling pressures with severe pulmonary hypertension.  This was mixed pulmonary arterial/pulmonary venous hypertension with PCWP 26 mmHg but primarily pulmonary arterial hypertension with PVR around 10 WU.  There was no obstructive CAD, prior stent patent.   CTA chest at admission showed no PE, no significant lung parenchymal disease.   General: NAD Neck: JVP difficult, no thyromegaly or thyroid  nodule.  Lungs: Clear to  auscultation bilaterally with normal respiratory effort. CV: Nondisplaced PMI.  Heart regular S1/S2, no S3/S4, 1/6 SEM RUSB.  No peripheral edema.  No carotid bruit.  Normal pedal pulses.  Abdomen: Soft, nontender, no hepatosplenomegaly, no distention.  Skin: Intact without lesions or rashes.  Neurologic: Alert and oriented x 3.  Psych: Normal affect. Extremities: No clubbing or cyanosis.  HEENT: Normal.   1. Pulmonary hypertension: Patient has severe pulmonary hypertension with evidence for RV dysfunction by echo.  Echo showed EF 70-75% with D-shaped interventricular septum, mild RV enlargement with mild-moderate RV dysfunction, PASP 77 mmHg, dilated IVC, mild aortic stenosis.   RHC  today showed elevated right and left heart filling pressures with mean PAP 64 mmHg, PVR 10 WU.  This appears to be combined group 1 and 2 PH, primarily group 1.  CTA chest with no significant lung parenchymal disease and no acute PE.  Prior pulmonary workup revealed OSA and mild asthma.  She is on CPAP at night.  She is not currently hypoxemic at rest. I do not think that OSA can explain the extent of her PH.  CI was low, 1.7 by Fick and 1.9 by thermodilution.  This is an ominous findings.  Her episodes of presyncope are also concerning.  - Complete PH workup with V/Q scan to rule out chronic PEs, rheumatologic serologies, and HIV. Reasonable to get HRCT chest to rule out ILD.  - Will get limited echo with bubble to rule out shunt lesion.  - She will need diuresis to lower PCWP to normal range so that we can safely start her on pulmonary vasodilators. Lasix  80 mg IV bid to start.  - With low output, if she does not respond well to diuresis, would consider PICC placement to follow CVP and co-ox and initiation of milrinone for RV and to lower PA pressure.  - When volume status is optimized, would start tadalafil.  In preparation for this, will need to stop Imdur .  - She will need close outpatient followup to for aggressive titration of pulmonary vasodilators.  - Hold off on digoxin with periodic mild bradycardia.  2. Acute diastolic CHF: Elevated PCWP in setting of normal EF.  Need to diurese to lower PCWP prior to initiation of pulmonary vasodilators as above.  3. CAD: H/o anterior MI.  LHC today with patent LAD stent, nonobstructive disease.  - ASA, statin.  4. OSA: Continue CPAP.  5. Active smoker: Prior PFTs not consistent with COPD and emphysema not noted on CTA chest.  Further assessment for pulmonary parenchymal disease with HRCT chest.   Needs to quit smoking.  6. Type 2 diabetes: per primary.  7. Carotid stenosis: Needs carotid dopplers.  Ezra Shuck 10/20/2023 5:28 PM

## 2023-10-20 NOTE — Progress Notes (Signed)
  Progress Note  Patient Name: DEJA PISARSKI Date of Encounter: 10/20/2023 [facility] Cardiologist: Jayson Sierras, MD   Interval Summary   Scheduled for [facility] today Doing well this morning, no acute complaints Denies any chest pain, shortness of breath Ambulating around room without issue   Vital Signs Vitals:   10/20/23 0010 10/20/23 0425 10/20/23 0700 10/20/23 0751  BP:  121/71    Pulse: (!) 51 (!) 43 64   Resp:  18 19 19   Temp:  98.2 F (36.8 C)    TempSrc:  Oral    SpO2: 93% 99%  91%  Weight:  92 kg    Height:        Intake/Output Summary (Last 24 hours) at 10/20/2023 0800 Last data filed at 10/20/2023 0456 Gross per 24 hour  Intake 980 ml  Output 1200 ml  Net -220 ml      10/20/2023    4:25 AM 10/19/2023    5:00 AM 10/18/2023    4:27 PM  Last 3 Weights  Weight (lbs) 202 lb 13.2 oz 205 lb 7.5 oz 210 lb 5.1 oz  Weight (kg) 92 kg 93.2 kg 95.4 kg     Telemetry/ECG  Sinus rhythm, HR 50-60s - Personally Reviewed  Physical Exam  GEN: No acute distress.   Neck: No JVD Cardiac: RRR, + murmur.  Respiratory: decreased breath sounds in bases. GI: Soft, nontender, non-distended  MS: No edema  Assessment & Plan  68 y.o. female with a history of CAD s/p anterior STEMI with DES to the LAD in 2018, PSVT, mild aortic stenosis, OSA on CPAP, asthma, and pulmonary hypertension. [facility] to undergo [facility] on 9/25  Dyspnea on exertion Chest tightness Intermittent syncope  Pulmonary HTN, unknown WHO group Recently worn ZIO, results not available yet ZIO from 2023: rare PVC/PACs, brief episodes of PSVT Troponin negative CTPA negative for PE Echo: LVEF 70-75%, G2DD, mild to moderately reduced RV function, RVSP 77.4 mmHg, dilated IVC [facility] Pending results of R/LHC Currently getting IV Lasix  40 mg BID, continue and reassess post-cath  History of CAD s/p anterior STEMI 2018 Hypertension  Troponin  negative EKG without acute ischemic changes Continue ASA 81 mg daily Continue Lipitor  40 mg every other day  Continue Farxiga  10 mg daily Continue Imdur  30 mg daily Continue Toprol  25 mg daily   Valvular disease Echo showed mild to moderate TR, mild AS Continue to follow    For questions or updates, please contact [facility] Please consult [email or web address] for contact info under         Signed, Waddell DELENA Donath, PA-C

## 2023-10-20 NOTE — Progress Notes (Signed)
 PROGRESS NOTE    Patient: [name]                            PCP: [name], MD                    DOB: 14-Oct-1955            DOA: 10/18/2023 FMW:[id number]             DOS: 10/20/2023, 1:33 PM   LOS: 2 days   Date of Service: The patient was seen and examined on 10/20/2023  Subjective:   The patient was seen and examined this morning, stable no acute distress, denies any chest pain, shortness of breath with exertion, currently satting 96% room air  N.p.o. in anticipation of left/right cardiac cath today 9/25  Brief Narrative:   JAYDON SOROKA is a 68 year old female with history of anxiety, aortic stenosis, CAD non-STEMI s/p DES to dLAD, COPD, HTN, HLD, morbid obesity, ASO, DM II.... Presenting from pulmonology office for acute on chronic respiratory failure, increased nonproductive cough, congestion, shortness of breath, hypoxia, episodes of dizziness, near passing out spells while walking.  Shortness of breath with minimal exertion, symptoms progressively getting worse over past few days. Patient was seen by her pulmonologist subsequently sent to the ED for evaluation of shortness of breath and and ruling out PE.  ED evaluation: Patient was satting 88-89% with ambulation and 2 L of oxygen  Blood pressure (!) 126/56, pulse 70, temperature 98.4 F (36.9 C), temperature source Oral, resp. rate (!) 24, height 5' 4 (1.626 m), weight 96.9 kg, SpO2 94% 2 L of oxygen .  LABs: CBC, CMP reviewed within normal limits with exception of glucose 163, calcium  8.1, magnesium  1.3, D-dimer elevated 3.12.  Respiratory panel RSV, influenza A/B, COVID all negative VBG: pH 7.43, pCO2 43, PO249, bicarb 28.5 BNP 373  CTA chest: Negative for PE, 2. Main pulmonary artery dilation, likely reflecting underlying pulmonary arterial hypertension.  Requested for patient to admitted for acute on chronic respiratory failure possibly CHF exacerbation with COPD exacerbation    Assessment & Plan:    Principal Problem:   Acute on chronic respiratory failure with hypoxia (HCC) Active Problems:   Acute exacerbation of CHF (congestive heart failure) (HCC)   Anxiety   Carotid artery stenosis   Essential hypertension   Depression   GERD (gastroesophageal reflux disease)   CAD (coronary artery disease)   COPD (chronic obstructive pulmonary disease) (HCC)   Mixed hyperlipidemia   Aortic stenosis   Type 2 diabetes mellitus with hyperglycemia (HCC)   DOE (dyspnea on exertion)   Obesity, Class I, BMI 30.0-34.9 (see actual BMI)   Hypomagnesemia   Pulmonary hypertension, unspecified (HCC)     Assessment and Plan: * Acute on chronic respiratory failure with hypoxia (HCC) Much improved weaning off supplemental O2  - Multifactorial, ? COPD with possible CHF with exacerbation With significant pulmonary hypertension based on echocardiogram  -CT angiogram rule out PE, possible artery hypertension  - Continue diuretics Lasix  40 mg IV twice daily  -Will monitor I's and O's, daily weight  Intake/Output Summary (Last 24 hours) at 10/20/2023 1335 Last data filed at 10/20/2023 9093 Gross per 24 hour  Intake 500 ml  Output 1900 ml  Net -1400 ml    - Will continue DuoNeb bronchodilators every 6 hours, - D/Ced IV steroids, -Encourage incentive spirometer, as needed mucolytics,  Pulmonary hypertension Acute exacerbation of  CHF (congestive heart failure) (HCC) - Monitoring I's and O's, daily weight, -IV diuretic Lasix  -Echocardiogram >> EJ F 70-75%, borderline concentric LVH, Grade II diastolic dysfunction. severely elevated pulmonary artery systolic pressure. - Cardiologist Dr. Debera recommended patient to be transferred to [facility] for cardiac cath for further evaluation in am 9/25 - BNP 373.0 >>>>  545.0  10/20/2023 planning for right/left cardiac catheterization today-for evaluation of pulmonary hypertension, versus pulmonary artery hypertension, degree of AAS, ?CAD      Carotid  artery stenosis - Continue aspirin , statins  Anxiety - Currently stable, as needed Xanax   GERD (gastroesophageal reflux disease)- Continue PPI  Depression- Continue home medication of Lexapro   Essential hypertension Reviewing and resuming home medication of Imdur , metoprolol , Holding Diovan , Patient will be on Lasix  40 mg IV twice daily   Hypomagnesemia Serum magnesium  1.3, repleted with 2 g of IV magnesium  Monitoring  Obesity, Class I, BMI 30.0-34.9 (see actual BMI) Body mass index is 36.66 kg/m. - Recommended patient to follow-up with the PCP, with aggressive weight loss program -Dietary control increase exercise recommended  DOE (dyspnea on exertion) - Monitoring closely, obtaining echocardiogram, diuresing -Based on CTA rule out PE -Consult PT OT for evaluation  Type 2 diabetes mellitus with hyperglycemia (HCC) - Holding home regimen, continuing Farxiga  -Checking CBG q. ACHS, SSI coverage -Last A1c 6.9 >> 7.3  Aortic stenosis - Aortic stenosis pulmonary hypertension -Maintain blood pressure check, diuresing -Follow-up with outpatient with cardiology and vascular  Mixed hyperlipidemia- Continue statins  COPD (chronic obstructive pulmonary disease) (HCC) - Follow-up with your primary pulmonologist Dr. Neomi for further evaluation and recommendations - Holding home inhalers, continue with DuoNeb every 6 hours -Encouraging incentive spirometer and flutter valve - IV steroids -will discontinue today  CAD (coronary artery disease) - Reviewing and continue home medication; including aspirin , statins, beta-blocker - Cardiology recommending cardiac cath-transferring to [facility] for further evaluation ----------------------------------------------------------------------------------------------------------------------------------------------- Nutritional status:  The patient's BMI is: Body mass index is 34.81 kg/m. I agree with the assessment and plan as outlined   DVT  prophylaxis:  heparin  injection 5,000 Units Start: 10/18/23 2200 SCDs Start: 10/18/23 1452   Code Status:   Code Status: Full Code  Family Communication: No family member present at bedside-  -Advance care planning has been discussed.   Admission status:   Status is: Inpatient Remains inpatient appropriate because: Needing continuous cardiac evaluation possible cardiac cath in a.m. IV diuretics,    Disposition: From  - home             Planning for discharge in 1-2 days   Procedures:   No admission procedures for hospital encounter.   Antimicrobials:  Anti-infectives (From admission, onward)    None        Medication:   [MAR Hold] aspirin  EC  81 mg Oral Daily   [MAR Hold] atorvastatin   40 mg Oral QODAY   [MAR Hold] dapagliflozin  propanediol  10 mg Oral Daily   [MAR Hold] escitalopram   20 mg Oral Q0600   [MAR Hold] furosemide   40 mg Intravenous Q12H   [MAR Hold] heparin   5,000 Units Subcutaneous Q8H   [MAR Hold] insulin  aspart  0-15 Units Subcutaneous TID WC   [MAR Hold] ipratropium  0.5 mg Nebulization TID   [MAR Hold] isosorbide  mononitrate  30 mg Oral Daily   [MAR Hold] levalbuterol   0.63 mg Nebulization TID   [MAR Hold] metoprolol  succinate  25 mg Oral Daily   [MAR Hold] sodium chloride  flush  3 mL Intravenous Q12H   [  MAR Hold] sodium chloride  flush  3 mL Intravenous Q12H    [MAR Hold] acetaminophen  **OR** [MAR Hold] acetaminophen , [MAR Hold] bisacodyl , fentaNYL , Heparin  (Porcine) in NaCl, [MAR Hold] hydrALAZINE , [MAR Hold]  HYDROmorphone  (DILAUDID ) injection, lidocaine  (PF), midazolam , [MAR Hold] nystatin  cream, [MAR Hold] ondansetron  **OR** [MAR Hold] ondansetron  (ZOFRAN ) IV, [MAR Hold] oxyCODONE , [MAR Hold] senna-docusate, [MAR Hold] traZODone    Objective:   Vitals:   10/20/23 0800 10/20/23 1100 10/20/23 1200 10/20/23 1322  BP: (!) 151/58 (!) 155/71 (!) 154/63   Pulse:      Resp: 18     Temp: 97.8 F (36.6 C) 97.8 F (36.6 C) 97.9 F (36.6 C)    TempSrc: Oral Oral Oral   SpO2:    96%  Weight:      Height:        Intake/Output Summary (Last 24 hours) at 10/20/2023 1333 Last data filed at 10/20/2023 0906 Gross per 24 hour  Intake 500 ml  Output 1900 ml  Net -1400 ml   Filed Weights   10/18/23 1627 10/19/23 0500 10/20/23 0425  Weight: 95.4 kg 93.2 kg 92 kg     Physical examination:   General:  AAO x 3,  cooperative, no distress;   HEENT:  Normocephalic, PERRL, otherwise with in Normal limits   Neuro:  CNII-XII intact. , normal motor and sensation, reflexes intact   Lungs:   Clear to auscultation BL, Respirations unlabored,  No wheezes / crackles  Cardio:    S1/S2, RRR, No murmure, No Rubs or Gallops   Abdomen:  Soft, non-tender, bowel sounds active all four quadrants, no guarding or peritoneal signs.  Muscular  skeletal:  Limited exam -global generalized weaknesses - in bed, able to move all 4 extremities,   2+ pulses,  symmetric, No pitting edema  Skin:  Dry, warm to touch, negative for any Rashes,  Wounds: Please see nursing documentation     ------------------------------------------------------------------------------------------------------------------------------    LABs:     Latest Ref Rng & Units 10/20/2023    8:52 AM 10/18/2023   10:33 AM 09/28/2023   10:28 AM  CBC  WBC 4.0 - 10.5 K/uL 6.8  6.6  7.6   Hemoglobin 12.0 - 15.0 g/dL 86.1  86.9  86.5   Hematocrit 36.0 - 46.0 % 43.4  41.7  42.4   Platelets 150 - 400 K/uL 201  228  232       Latest Ref Rng & Units 10/20/2023    8:52 AM 10/19/2023    4:08 AM 10/18/2023   10:33 AM  CMP  Glucose 70 - 99 mg/dL 825  724  836   BUN 8 - 23 mg/dL 23  23  22    Creatinine 0.44 - 1.00 mg/dL 9.18  9.17  9.13   Sodium 135 - 145 mmol/L 138  139  140   Potassium 3.5 - 5.1 mmol/L 3.6  4.0  4.0   Chloride 98 - 111 mmol/L 98  96  101   CO2 22 - 32 mmol/L 26  28  25    Calcium  8.9 - 10.3 mg/dL 9.0  8.6  8.8   Total Protein 6.5 - 8.1 g/dL   6.9   Total Bilirubin 0.0 -  1.2 mg/dL   0.5   Alkaline Phos 38 - 126 U/L   85   AST 15 - 41 U/L   26   ALT 0 - 44 U/L   25        Micro Results Recent Results (from the past  240 hours)  Resp panel by RT-PCR (RSV, Flu A&B, Covid) Anterior Nasal Swab     Status: None   Collection Time: 10/18/23 10:14 AM   Specimen: Anterior Nasal Swab  Result Value Ref Range Status   SARS Coronavirus 2 by RT PCR NEGATIVE NEGATIVE Final    Comment: (NOTE) SARS-CoV-2 target nucleic acids are NOT DETECTED.  The SARS-CoV-2 RNA is generally detectable in upper respiratory specimens during the acute phase of infection. The lowest concentration of SARS-CoV-2 viral copies this assay can detect is 138 copies/mL. A negative result does not preclude SARS-Cov-2 infection and should not be used as the sole basis for treatment or other patient management decisions. A negative result may occur with  improper specimen collection/handling, submission of specimen other than nasopharyngeal swab, presence of viral mutation(s) within the areas targeted by this assay, and inadequate number of viral copies(<138 copies/mL). A negative result must be combined with clinical observations, patient history, and epidemiological information. The expected result is Negative.  Fact Sheet for Patients:  BloggerCourse.com  Fact Sheet for Healthcare Providers:  SeriousBroker.it  This test is no t yet approved or cleared by the [place]  FDA and  has been authorized for detection and/or diagnosis of SARS-CoV-2 by FDA under an Emergency Use Authorization (EUA). This EUA will remain  in effect (meaning this test can be used) for the duration of the COVID-19 declaration under Section 564(b)(1) of the Act, 21 U.S.C.section 360bbb-3(b)(1), unless the authorization is terminated  or revoked sooner.       Influenza A by PCR NEGATIVE NEGATIVE Final   Influenza B by PCR NEGATIVE NEGATIVE Final    Comment:  (NOTE) The Xpert Xpress SARS-CoV-2/FLU/RSV plus assay is intended as an aid in the diagnosis of influenza from Nasopharyngeal swab specimens and should not be used as a sole basis for treatment. Nasal washings and aspirates are unacceptable for Xpert Xpress SARS-CoV-2/FLU/RSV testing.  Fact Sheet for Patients: BloggerCourse.com  Fact Sheet for Healthcare Providers: SeriousBroker.it  This test is not yet approved or cleared by the [place]  FDA and has been authorized for detection and/or diagnosis of SARS-CoV-2 by FDA under an Emergency Use Authorization (EUA). This EUA will remain in effect (meaning this test can be used) for the duration of the COVID-19 declaration under Section 564(b)(1) of the Act, 21 U.S.C. section 360bbb-3(b)(1), unless the authorization is terminated or revoked.     Resp Syncytial Virus by PCR NEGATIVE NEGATIVE Final    Comment: (NOTE) Fact Sheet for Patients: BloggerCourse.com  Fact Sheet for Healthcare Providers: SeriousBroker.it  This test is not yet approved or cleared by the [place]  FDA and has been authorized for detection and/or diagnosis of SARS-CoV-2 by FDA under an Emergency Use Authorization (EUA). This EUA will remain in effect (meaning this test can be used) for the duration of the COVID-19 declaration under Section 564(b)(1) of the Act, 21 U.S.C. section 360bbb-3(b)(1), unless the authorization is terminated or revoked.  Performed at [facility], [address], [place], [place] [address]     Radiology Reports No results found.   SIGNED: Adriana DELENA Grams, MD, FHM. FAAFP. [name] - [facility] Time spent - 55 min.  In seeing, evaluating and examining the patient. Reviewing medical records, labs, drawn plan of care. [facility],  Pager (please use [email or web address] to page/ text) Please use Epic Secure Chat for  non-urgent communication (7AM-7PM)  If 7PM-7AM, please contact night-coverage [email or web address], 10/20/2023, 1:33 PM

## 2023-10-20 NOTE — Interval H&P Note (Signed)
 History and Physical Interval Note:  10/20/2023 6:53 AM  [name]  has presented today for surgery, with the diagnosis of hp - cad.  The various methods of treatment have been discussed with the patient and family. After consideration of risks, benefits and other options for treatment, the patient has consented to  Procedure(s): RIGHT/LEFT HEART CATH AND CORONARY ANGIOGRAPHY (N/A) as a surgical intervention.  The patient's history has been reviewed, patient examined, no change in status, stable for surgery.  I have reviewed the patient's chart and labs.  Questions were answered to the patient's satisfaction.     Wendall Isabell K Loza Prell

## 2023-10-21 ENCOUNTER — Inpatient Hospital Stay (HOSPITAL_COMMUNITY)

## 2023-10-21 ENCOUNTER — Other Ambulatory Visit (HOSPITAL_COMMUNITY): Payer: Self-pay

## 2023-10-21 ENCOUNTER — Encounter (HOSPITAL_COMMUNITY): Payer: Self-pay | Admitting: Internal Medicine

## 2023-10-21 ENCOUNTER — Telehealth (HOSPITAL_COMMUNITY): Payer: Self-pay

## 2023-10-21 DIAGNOSIS — J9621 Acute and chronic respiratory failure with hypoxia: Secondary | ICD-10-CM | POA: Diagnosis not present

## 2023-10-21 DIAGNOSIS — I272 Pulmonary hypertension, unspecified: Secondary | ICD-10-CM

## 2023-10-21 DIAGNOSIS — I5033 Acute on chronic diastolic (congestive) heart failure: Secondary | ICD-10-CM | POA: Diagnosis not present

## 2023-10-21 LAB — CBC
HCT: 40 % (ref 36.0–46.0)
Hemoglobin: 12.9 g/dL (ref 12.0–15.0)
MCH: 29.3 pg (ref 26.0–34.0)
MCHC: 32.3 g/dL (ref 30.0–36.0)
MCV: 90.7 fL (ref 80.0–100.0)
Platelets: 209 K/uL (ref 150–400)
RBC: 4.41 MIL/uL (ref 3.87–5.11)
RDW: 15.6 % — ABNORMAL HIGH (ref 11.5–15.5)
WBC: 5.2 K/uL (ref 4.0–10.5)
nRBC: 0 % (ref 0.0–0.2)

## 2023-10-21 LAB — BASIC METABOLIC PANEL WITH GFR
Anion gap: 15 (ref 5–15)
BUN: 24 mg/dL — ABNORMAL HIGH (ref 8–23)
CO2: 28 mmol/L (ref 22–32)
Calcium: 8.4 mg/dL — ABNORMAL LOW (ref 8.9–10.3)
Chloride: 96 mmol/L — ABNORMAL LOW (ref 98–111)
Creatinine, Ser: 0.88 mg/dL (ref 0.44–1.00)
GFR, Estimated: >60 mL/min (ref >60)
Glucose, Bld: 178 mg/dL — ABNORMAL HIGH (ref 70–99)
Potassium: 3.3 mmol/L — ABNORMAL LOW (ref 3.5–5.1)
Sodium: 139 mmol/L (ref 135–145)

## 2023-10-21 LAB — ECHOCARDIOGRAM COMPLETE BUBBLE STUDY
AR max vel: 0.73 cm2
AV Area VTI: 0.76 cm2
AV Area mean vel: 0.71 cm2
AV Mean grad: 9.3 mmHg
AV Peak grad: 18 mmHg
Ao pk vel: 2.12 m/s
Area-P 1/2: 3.46 cm2
Calc EF: 71 %
S' Lateral: 2.5 cm
Single Plane A2C EF: 65.1 %
Single Plane A4C EF: 75.9 %

## 2023-10-21 LAB — GLUCOSE, CAPILLARY
Glucose-Capillary: 194 mg/dL — ABNORMAL HIGH (ref 70–99)
Glucose-Capillary: 250 mg/dL — ABNORMAL HIGH (ref 70–99)
Glucose-Capillary: 223 mg/dL — ABNORMAL HIGH (ref 70–99)
Glucose-Capillary: 348 mg/dL — ABNORMAL HIGH (ref 70–99)

## 2023-10-21 LAB — BRAIN NATRIURETIC PEPTIDE: B Natriuretic Peptide: 289 pg/mL — ABNORMAL HIGH (ref 0.0–100.0)

## 2023-10-21 MED ORDER — SILDENAFIL CITRATE 20 MG PO TABS
20.0000 mg | ORAL_TABLET | Freq: Three times a day (TID) | ORAL | Status: DC
  Administered 2023-10-21 – 2023-10-24 (×9): 20 mg via ORAL
  Filled 2023-10-21 (×12): qty 1

## 2023-10-21 MED ORDER — POTASSIUM CHLORIDE CRYS ER 20 MEQ PO TBCR
40.0000 meq | EXTENDED_RELEASE_TABLET | Freq: Once | ORAL | Status: AC
  Administered 2023-10-21: 40 meq via ORAL
  Filled 2023-10-21: qty 2

## 2023-10-21 MED ORDER — TECHNETIUM TO 99M ALBUMIN AGGREGATED
4.4000 | Freq: Once | INTRAVENOUS | Status: AC
  Administered 2023-10-21: 4.4 via INTRAVENOUS

## 2023-10-21 MED ORDER — ENOXAPARIN SODIUM 40 MG/0.4ML IJ SOSY: 40.0000 mg | PREFILLED_SYRINGE | INTRAMUSCULAR | Status: DC

## 2023-10-21 NOTE — Progress Notes (Signed)
 Advanced Heart Failure Rounding Note  Cardiologist: Jayson Sierras, MD  Chief Complaint: Pulmonary HTN Subjective:    -4.7L UOP yesterday with diuresis. Weight down 6lbs.   Sitting on EOB. Denies CP/SOB.   Objective:   Weight Range: 89 kg Body mass index is 33.66 kg/m.   Vital Signs:   Temp:  [97.9 F (36.6 C)-98.2 F (36.8 C)] 98.2 F (36.8 C) (09/26 0750) Pulse Rate:  [0-71] 58 (09/26 0750) Resp:  [12-30] 19 (09/26 0750) BP: (110-163)/(37-114) 150/70 (09/26 0750) SpO2:  [88 %-99 %] 91 % (09/26 0750) Weight:  [89 kg] 89 kg (09/26 1003) Last BM Date : 10/18/23  Weight change: Filed Weights   10/19/23 0500 10/20/23 0425 10/21/23 1003  Weight: 93.2 kg 92 kg 89 kg    Intake/Output:   Intake/Output Summary (Last 24 hours) at 10/21/2023 1122 Last data filed at 10/21/2023 1003 Gross per 24 hour  Intake --  Output 6400 ml  Net -6400 ml    Physical Exam   General:  elderly appearing.  No respiratory difficulty Neck: JVD ~12 cm.  Cor: Regular rate & rhythm. No murmurs. Lungs: clear, diminished bases Extremities: no edema  Neuro: alert & oriented x 3. Affect pleasant.  Telemetry   SB-NSR 40s-60s 9 beat NSVT (Personally reviewed)    Labs    CBC Recent Labs    10/20/23 0852 10/20/23 1337 10/20/23 1355 10/21/23 0454  WBC 6.8  --   --  5.2  HGB 13.8   < > 13.3 12.9  HCT 43.4   < > 39.0 40.0  MCV 91.2  --   --  90.7  PLT 201  --   --  209   < > = values in this interval not displayed.   Basic Metabolic Panel Recent Labs    90/76/74 1512 10/19/23 0408 10/20/23 0852 10/20/23 1337 10/20/23 1355 10/21/23 0454  NA  --    < > 138   < > 141 139  K  --    < > 3.6   < > 3.3* 3.3*  CL  --    < > 98  --   --  96*  CO2  --    < > 26  --   --  28  GLUCOSE  --    < > 174*  --   --  178*  BUN  --    < > 23  --   --  24*  CREATININE  --    < > 0.81  --   --  0.88  CALCIUM   --    < > 9.0  --   --  8.4*  MG 1.7  --   --   --   --   --   PHOS 4.2  --   --    --   --   --    < > = values in this interval not displayed.   Liver Function Tests No results for input(s): AST, ALT, ALKPHOS, BILITOT, PROT, ALBUMIN  in the last 72 hours. No results for input(s): LIPASE, AMYLASE in the last 72 hours. Cardiac Enzymes No results for input(s): CKTOTAL, CKMB, CKMBINDEX, TROPONINI in the last 72 hours.  BNP: BNP (last 3 results) Recent Labs    10/18/23 1033 10/19/23 0408 10/21/23 0454  BNP 373.0* 545.0* 289.0*    ProBNP (last 3 results) No results for input(s): PROBNP in the last 8760 hours.   D-Dimer No results for input(s): DDIMER in  the last 72 hours. Hemoglobin A1C Recent Labs    10/19/23 0408  HGBA1C 7.3*   Fasting Lipid Panel Recent Labs    10/18/23 1512  CHOL 81  HDL 32*  LDLCALC 29  TRIG 899  CHOLHDL 2.5   Thyroid  Function Tests No results for input(s): TSH, T4TOTAL, T3FREE, THYROIDAB in the last 72 hours.  Invalid input(s): FREET3  Other results:   Imaging    CARDIAC CATHETERIZATION Result Date: 10/20/2023   Prox LAD lesion is 10% stenosed.   Mid RCA lesion is 30% stenosed.   Previously placed Mid LAD to Dist LAD stent of unknown type is  widely patent. 1.  Mild, nonobstructive coronary artery disease with patent mid to distal LAD stent. 2.  Fick cardiac output of 3.4 liters per minute and Fick cardiac index of 1.7 L/min/m      TD cardiac output of 3.7 L/min and TD cardiac output of 1.9 L/min/m   RA pressure mean of 22 mmHg   RV pressure 114/4 with an end-diastolic pressure of 24 mmHg   PA pressure 114/35 with a mean PA pressure of 64 mmHg   Wedge pressure mean of 26 mmHg   PA pulsatility index of 3.6   PVR of 11.2 Woods units by Fick; 10.3 Woods units by TD 3.  LVEDP of 29 mmHg 4.  Breathing room air the patient's noninvasive oxygen  saturation was in the 80s; ABG drawn in the cardiac catheterization laboratory demonstrated a pH of 7.4, pCO2 of 50, and PaO2 of 46 Summary: Elevated  biventricular filling pressures with wedge of 26 mmHg, RA of 22 mmHg, and PVR of greater than 10 Wood units.  Recommend advanced heart failure consultation.     Medications:     Scheduled Medications:  aspirin  EC  81 mg Oral Daily   atorvastatin   40 mg Oral QODAY   dapagliflozin  propanediol  10 mg Oral Daily   escitalopram   20 mg Oral Q0600   furosemide   80 mg Intravenous Q12H   insulin  aspart  0-15 Units Subcutaneous TID WC   ipratropium  0.5 mg Nebulization TID   levalbuterol   0.63 mg Nebulization TID   potassium chloride   40 mEq Oral Once   sildenafil   20 mg Oral TID   sodium chloride  flush  3 mL Intravenous Q12H   sodium chloride  flush  3 mL Intravenous Q12H    Infusions:   PRN Medications: acetaminophen  **OR** acetaminophen , acetaminophen , bisacodyl , hydrALAZINE , HYDROmorphone  (DILAUDID ) injection, nystatin  cream, ondansetron  **OR** ondansetron  (ZOFRAN ) IV, oxyCODONE , senna-docusate, traZODone     Patient Profile   68 y/o female smoker w/ h/o chronic asthmatic bronchitis followed by Dr. Darlean, OSA on CPAP, Obesity, PH w/ associated RV dysfunction, CAD s/p anterior STEMI w/ DES to LAD in 2026, b/l carotid artery dz admitted for acute hypoxic respiratory failure and found to have a/c HFpEF w/ prominent RV dysfunction in the setting of severe PAH.    Assessment/Plan   1. Severe PH  - RHC c/w severe pre and post capillary PH>>RA 22, PAP 114/35, PCW 26, PVR 11.2 WU, PAPi 3.6 (unreliable in setting of high PAP). - Imdur  washout complete later today. Start sildenafil  20 mg TID today (timing per PharmD). (Plan for tadalafil OP) - Volume still mildly elevated. Continue Lasix  80 mg bid. - CT negative for PE but needs V/Q scan to r/o CTEPH  - obtain CTD serologies (Rheum Factor, ANA, Anti Scleroderma Antibody, Centromere Antibodies) - HIV (-) - continue CPAP for OSA   - PFTs 12/24  demonstrated restriction and moderate diffusion defect. Minimal OAD. Plan HRCT to check for ILD  -  repeat RHC hemodynamics after diuresis and addition of vasodilators    2. HFpEF w/ Prominent RV Dysfunction  - Echo EF 70-75%, GIIDD, interventricular septum is flattened in systole and diastole consistent with right ventricular pressure and volume overload, RV mildly enlarged w/ mild-moderately reduced SFx, estimated RVSP 77 mmHg, mild-mod TR, IVC dilated  with <50% respiratory variability suggesting right atrial pressure of 15 mmHg - NYHA III-IIIb on admission  - RHC today w/ elevated biventricular filling pressures and low CO, in setting of severe PAH   RA 22, PAP 114/35, PCW 26, CI 1.9 by TD, 1.7 by FICK - Diurese w/ IV Laix 80 mg bid - RV afterload reduction w/ PH treatment per above - stop ? blocker w/ low output  - avoid digoxin given periodic bradycardia, HR occasional drops to the 40s  - on SGLT2i, continue Farxiga  10 mg daily    3. CAD - h/o STEMI in 2016 treated w/ LAD stent - LHC today w/ nonobstructive dz and patent LAD stent  - continue medical management    4. OSA/OHS - continue CPAP  - PFTs 12/24 demonstrated restriction c/w body habitus. Needs wt loss. Would benefit from GLP1     5. Tobacco Use - smoker x 30+ years, trying to quit, now down to 1/4 ppd - encouraged cessation    6. Type 2DM -  Hgb A1c  7.3 - w/ DM + OSA and obesity would benefit from GLP1   7. Bilateral Carotid Artery Disease - 50- 69% pRICA and 50-69% LICA stenosis on last assessment 10/23 - overdue for repeat evaluation. Recommend repeat study  - continue ASA 81 + statin  + tight BP control   Length of Stay: 3  Beckey LITTIE Coe, NP  10/21/2023, 11:22 AM  Advanced Heart Failure Team Pager [phone] (M-F; 7a - 5p)  Please contact [facility] for night-coverage after hours (5p -7a ) and weekends on [email or web address]

## 2023-10-21 NOTE — Telephone Encounter (Signed)
 Advanced Heart Failure Patient Advocate Encounter  Care team discussed potentially starting Sildenafil  for this patient. Current RHC results would not be eligible for approval at this time. Scheduling repeat RHC and will reassess at that time.  Rachel DEL, CPhT Rx Patient Advocate Phone: [phone]

## 2023-10-21 NOTE — Progress Notes (Signed)
 Heart Failure Navigator Progress Note  Assessed for Heart & Vascular TOC clinic readiness.  Patient does not meet criteria due to Advanced Heart Failure Team consulted..   Navigator will sign off at this time.    Stephane Haddock, BSN, Scientist, clinical (histocompatibility and immunogenetics) Only

## 2023-10-21 NOTE — Progress Notes (Signed)
 PROGRESS NOTE  [name]  FMW:[id number] DOB: [birth year] DOA: 10/18/2023 PCP: [name], MD   Brief Narrative: Patient is a 69 year old female with history of pulmonary hypertension associated with right ventricular dysfunction, OSA on CPAP, asthmatic bronchitis, aortic stenosis, coronary artery disease, non-STEMI status post PCI, COPD, hypertension, hyperlipidemia, morbid obesity, diabetes type 2, anxiety who presented after she was referred from pulmonology office for the evaluation of acute on chronic hypoxic respiratory failure, increased cough, congestion, shortness of breath, dizziness.  On presentation, she was hypoxic and was put on 2 L of oxygen  per minute.  COVID/flu/RSV negative.  CTA chest negative for PE, showed dilation of main pulmonary artery reflecting underlying pulmonary hypertension.  Underwent right/left heart catheterization here.  Currently being managed for acute on chronic CHF exacerbation, pulmonary hypertension.  Cardiology following.  Currently on IV Lasix   Assessment & Plan:  Principal Problem:   Acute on chronic respiratory failure with hypoxia (HCC) Active Problems:   Acute exacerbation of CHF (congestive heart failure) (HCC)   Anxiety   Carotid artery stenosis   Essential hypertension   Depression   GERD (gastroesophageal reflux disease)   CAD (coronary artery disease)   COPD (chronic obstructive pulmonary disease) (HCC)   Mixed hyperlipidemia   Aortic stenosis   Type 2 diabetes mellitus with hyperglycemia (HCC)   DOE (dyspnea on exertion)   Obesity, Class I, BMI 30.0-34.9 (see actual BMI)   Hypomagnesemia   Pulmonary hypertension, unspecified (HCC)   Acute on chronic HFpEF/pulmonary hypertension: Presented with dyspnea with minimal exertion.  Found to be hypoxic on presentation.  CT chest negative for PE but showed dilation of main pulmonary artery consistent with pulmonary hypertension.  Elevated BNP.  2D echo showed hyperdynamic left  ventricular function with EF of 70 to 75%, grade 2 diastolic dysfunction, features suggesting right ventricular pressure and volume overload.  Heart failure team consulted and following.  Underwent right/left heart cath on 9/25 which showed mild nonobstructive coronary disease, decreased right heart function. Currently on IV Lasix . Elevated D-dimer so might need VQ scan to rule out chronic thromboembolic pulmonary hypertension. Will defer to cardiology. Also obtained rheumatoid factor, ANA, antihistone, antibody, centromere antibody to rule out autoimmune cause of elevated pulmonary artery hypertension.  Negative HIV.  Plan to repeat RHC hemodynamics after diuresis and addition of vasodilators.  Currently on sildenafil . Also on Farxiga   History of coronary artery disease: No anginal symptoms history of NSTEMI in 2016 treated with LAD stent.  LHC done on 9/25 showed nonobstructive disease and patent LAD  History of OSA: On CPAP  History of asthmatic bronchitis: Follows with Dr. Darlean.  Continue bronchodilators as needed.  Currently not wheezing  Hyperlipidemia: On statin  Hypertension: On Diovan , Imdur , metoprolol  at home.  Anxiety/depression: As needed Xanax .  Also on Lexapro   GERD: Continue PPI  Hypokalemia: Supplemented with potassium  Tobacco use: 30+ years smoking history.  Trying to quit.  Counseled cessation.  Continue nicotine  patch.  Still smokes 5 cigarettes a day  Diabetes type 2: Continue sliding scale insulin .  Also on Farxiga  now.  A1c of 7.3  History of bilateral carotid artery disease: Continue aspirin , statin.  Might repeat carotid Doppler .  Can be done as an outpatient        DVT prophylaxis:Lovenox      Code Status: Full Code  Family Communication: None at bedside  Patient status:Inpatient  Patient is from :Home  Anticipated discharge un:Ynfz  Estimated DC date:2-3 days, after cardiology clearance   Consultants:  Cardiology  Procedures: Left/right  heart cath  Antimicrobials:  Anti-infectives (From admission, onward)    None       Subjective: Patient seen and examined at bedside today.  Very comfortable this morning.  Denies shortness of breath or cough.  On room air.  Was ambulating to bathroom without any difficulty.  No lower extremity edema.  Overall looks euvolemic.  Objective: Vitals:   10/20/23 2032 10/21/23 0100 10/21/23 0610 10/21/23 0750  BP:  (!) 159/76 (!) 110/56 (!) 150/70  Pulse: (!) 59 (!) 55 66 (!) 58  Resp: 17 16 17 19   Temp:  97.9 F (36.6 C) 97.9 F (36.6 C) 98.2 F (36.8 C)  TempSrc:  Oral Oral Oral  SpO2: 95% 94% 95% 91%  Weight:      Height:        Intake/Output Summary (Last 24 hours) at 10/21/2023 0849 Last data filed at 10/21/2023 9191 Gross per 24 hour  Intake --  Output 5100 ml  Net -5100 ml   Filed Weights   10/18/23 1627 10/19/23 0500 10/20/23 0425  Weight: 95.4 kg 93.2 kg 92 kg    Examination:  General exam: Overall comfortable, not in distress HEENT: PERRL Respiratory system:  no wheezes or crackles , mildly diminished sounds on bases Cardiovascular system: S1 & S2 heard, RRR.  Gastrointestinal system: Abdomen is nondistended, soft and nontender. Central nervous system: Alert and oriented Extremities: No edema, no clubbing ,no cyanosis Skin: No rashes, no ulcers,no icterus     Data Reviewed: I have personally reviewed following labs and imaging studies  CBC: Recent Labs  Lab 10/18/23 1033 10/20/23 0852 10/20/23 1337 10/20/23 1339 10/20/23 1341 10/20/23 1355 10/21/23 0454  WBC 6.6 6.8  --   --   --   --  5.2  NEUTROABS 4.6  --   --   --   --   --   --   HGB 13.0 13.8 13.9 12.6 13.3 13.3 12.9  HCT 41.7 43.4 41.0 37.0 39.0 39.0 40.0  MCV 93.7 91.2  --   --   --   --  90.7  PLT 228 201  --   --   --   --  209   Basic Metabolic Panel: Recent Labs  Lab 10/18/23 1033 10/18/23 1512 10/19/23 0408 10/20/23 0852 10/20/23 1337 10/20/23 1339 10/20/23 1341  10/20/23 1355 10/21/23 0454  NA 140  --  139 138 140 122* 141 141 139  K 4.0  --  4.0 3.6 3.5 <2.0* 3.3* 3.3* 3.3*  CL 101  --  96* 98  --   --   --   --  96*  CO2 25  --  28 26  --   --   --   --  28  GLUCOSE 163*  --  275* 174*  --   --   --   --  178*  BUN 22  --  23 23  --   --   --   --  24*  CREATININE 0.86  --  0.82 0.81  --   --   --   --  0.88  CALCIUM  8.8*  --  8.6* 9.0  --   --   --   --  8.4*  MG 1.3* 1.7  --   --   --   --   --   --   --   PHOS  --  4.2  --   --   --   --   --   --   --  Recent Results (from the past 240 hours)  Resp panel by RT-PCR (RSV, Flu A&B, Covid) Anterior Nasal Swab     Status: None   Collection Time: 10/18/23 10:14 AM   Specimen: Anterior Nasal Swab  Result Value Ref Range Status   SARS Coronavirus 2 by RT PCR NEGATIVE NEGATIVE Final    Comment: (NOTE) SARS-CoV-2 target nucleic acids are NOT DETECTED.  The SARS-CoV-2 RNA is generally detectable in upper respiratory specimens during the acute phase of infection. The lowest concentration of SARS-CoV-2 viral copies this assay can detect is 138 copies/mL. A negative result does not preclude SARS-Cov-2 infection and should not be used as the sole basis for treatment or other patient management decisions. A negative result may occur with  improper specimen collection/handling, submission of specimen other than nasopharyngeal swab, presence of viral mutation(s) within the areas targeted by this assay, and inadequate number of viral copies(<138 copies/mL). A negative result must be combined with clinical observations, patient history, and epidemiological information. The expected result is Negative.  Fact Sheet for Patients:  BloggerCourse.com  Fact Sheet for Healthcare Providers:  SeriousBroker.it  This test is no t yet approved or cleared by the [place]  FDA and  has been authorized for detection and/or diagnosis of SARS-CoV-2  by FDA under an Emergency Use Authorization (EUA). This EUA will remain  in effect (meaning this test can be used) for the duration of the COVID-19 declaration under Section 564(b)(1) of the Act, 21 U.S.C.section 360bbb-3(b)(1), unless the authorization is terminated  or revoked sooner.       Influenza A by PCR NEGATIVE NEGATIVE Final   Influenza B by PCR NEGATIVE NEGATIVE Final    Comment: (NOTE) The Xpert Xpress SARS-CoV-2/FLU/RSV plus assay is intended as an aid in the diagnosis of influenza from Nasopharyngeal swab specimens and should not be used as a sole basis for treatment. Nasal washings and aspirates are unacceptable for Xpert Xpress SARS-CoV-2/FLU/RSV testing.  Fact Sheet for Patients: BloggerCourse.com  Fact Sheet for Healthcare Providers: SeriousBroker.it  This test is not yet approved or cleared by the [place]  FDA and has been authorized for detection and/or diagnosis of SARS-CoV-2 by FDA under an Emergency Use Authorization (EUA). This EUA will remain in effect (meaning this test can be used) for the duration of the COVID-19 declaration under Section 564(b)(1) of the Act, 21 U.S.C. section 360bbb-3(b)(1), unless the authorization is terminated or revoked.     Resp Syncytial Virus by PCR NEGATIVE NEGATIVE Final    Comment: (NOTE) Fact Sheet for Patients: BloggerCourse.com  Fact Sheet for Healthcare Providers: SeriousBroker.it  This test is not yet approved or cleared by the [place]  FDA and has been authorized for detection and/or diagnosis of SARS-CoV-2 by FDA under an Emergency Use Authorization (EUA). This EUA will remain in effect (meaning this test can be used) for the duration of the COVID-19 declaration under Section 564(b)(1) of the Act, 21 U.S.C. section 360bbb-3(b)(1), unless the authorization is terminated or revoked.  Performed at  [facility], [address] [place], [place] [address]      Radiology Studies: CARDIAC CATHETERIZATION Result Date: 10/20/2023   Prox LAD lesion is 10% stenosed.   Mid RCA lesion is 30% stenosed.   Previously placed Mid LAD to Dist LAD stent of unknown type is  widely patent. 1.  Mild, nonobstructive coronary artery disease with patent mid to distal LAD stent. 2.  Fick cardiac output of 3.4 liters per minute and Fick cardiac index of 1.7 L/min/m  TD cardiac output of 3.7 L/min and TD cardiac output of 1.9 L/min/m   RA pressure mean of 22 mmHg   RV pressure 114/4 with an end-diastolic pressure of 24 mmHg   PA pressure 114/35 with a mean PA pressure of 64 mmHg   Wedge pressure mean of 26 mmHg   PA pulsatility index of 3.6   PVR of 11.2 Woods units by Fick; 10.3 Woods units by TD 3.  LVEDP of 29 mmHg 4.  Breathing room air the patient's noninvasive oxygen  saturation was in the 80s; ABG drawn in the cardiac catheterization laboratory demonstrated a pH of 7.4, pCO2 of 50, and PaO2 of 46 Summary: Elevated biventricular filling pressures with wedge of 26 mmHg, RA of 22 mmHg, and PVR of greater than 10 Wood units.  Recommend advanced heart failure consultation.   ECHOCARDIOGRAM COMPLETE Result Date: 10/19/2023    ECHOCARDIOGRAM REPORT   Patient Name:   [name] Date of Exam: 10/19/2023 Medical Rec #:  [id number]      Height:       64.0 in Accession #:    [id number]     Weight:       205.5 lb Date of Birth:  Jul 02, 1955      BSA:          1.979 m Patient Age:    67 years       BP:           145/64 mmHg Patient Gender: F              HR:           64 bpm. Exam Location:  [facility] Procedure: 2D Echo, Cardiac Doppler, Color Doppler, Strain Analysis and            Intracardiac Opacification Agent (Both Spectral and Color Flow            Doppler were utilized during procedure). Indications:    CHF- Acute Diastolic l50.31  History:        Patient has prior history of Echocardiogram examinations, most                  recent 11/22/2021. CHF, Previous Myocardial Infarction and CAD,                 COPD; Risk Factors:Hypertension, Diabetes, Dyslipidemia, Current                 Smoker and Sleep Apnea.  Sonographer:    Aida Pizza RCS Referring Phys: [phone] SEYED A SHAHMEHDI  Sonographer Comments: Global longitudinal strain was attempted. IMPRESSIONS  1. Left ventricular ejection fraction, by estimation, is 70 to 75%. The left ventricle has hyperdynamic function. The left ventricle has no regional wall motion abnormalities. Left ventricular diastolic parameters are consistent with Grade II diastolic dysfunction (pseudonormalization). Elevated left atrial pressure. There is the interventricular septum is flattened in systole and diastole, consistent with right ventricular pressure and volume overload.  2. Right ventricular systolic function is mildly to moderately reduced. The right ventricular size is mildly enlarged. There is severely elevated pulmonary artery systolic pressure. The estimated right ventricular systolic pressure is 77.4 mmHg.  3. The mitral valve is degenerative. Trivial mitral valve regurgitation.  4. Tricuspid valve regurgitation is mild to moderate.  5. The aortic valve is tricuspid. There is mild calcification of the aortic valve. Aortic valve regurgitation is trivial. Mild aortic valve stenosis. Aortic valve mean gradient measures 10.0 mmHg. Dimentionless index 0.51.  6.  The inferior vena cava is dilated in size with <50% respiratory variability, suggesting right atrial pressure of 15 mmHg. Comparison(s): Prior images reviewed side by side. LVEF 70-75% with moderate diastolic dysfunction. Severely elevated estimated RVSP of 77 mmHg with RV dysfunction. Mild aortic stenosis. Mild to moderate tricuspid regurgitation. FINDINGS  Left Ventricle: Left ventricular ejection fraction, by estimation, is 70 to 75%. The left ventricle has hyperdynamic function. The left ventricle has no regional wall motion  abnormalities. Definity  contrast agent was given IV to delineate the left ventricular endocardial borders. The left ventricular internal cavity size was normal in size. There is borderline concentric left ventricular hypertrophy. The interventricular septum is flattened in systole and diastole, consistent with right ventricular pressure and volume overload. Left ventricular diastolic parameters are consistent with Grade II diastolic dysfunction (pseudonormalization). Elevated left atrial pressure. Right Ventricle: The right ventricular size is mildly enlarged. No increase in right ventricular wall thickness. Right ventricular systolic function is mildly reduced. There is severely elevated pulmonary artery systolic pressure. The tricuspid regurgitant velocity is 3.95 m/s, and with an assumed right atrial pressure of 15 mmHg, the estimated right ventricular systolic pressure is 77.4 mmHg. Left Atrium: Left atrial size was normal in size. Right Atrium: Right atrial size was normal in size. Pericardium: There is no evidence of pericardial effusion. Presence of epicardial fat layer. Mitral Valve: The mitral valve is degenerative in appearance. Mild to moderate mitral annular calcification. Trivial mitral valve regurgitation. Tricuspid Valve: The tricuspid valve is grossly normal. Tricuspid valve regurgitation is mild to moderate. Aortic Valve: The aortic valve is tricuspid. There is mild calcification of the aortic valve. There is mild aortic valve annular calcification. Aortic valve regurgitation is trivial. Mild aortic stenosis is present. Aortic valve mean gradient measures 10.0 mmHg. Aortic valve peak gradient measures 21.2 mmHg. Aortic valve area, by VTI measures 1.16 cm. Pulmonic Valve: The pulmonic valve was grossly normal. Pulmonic valve regurgitation is mild. Aorta: The aortic root is normal in size and structure. Venous: The inferior vena cava is dilated in size with less than 50% respiratory variability,  suggesting right atrial pressure of 15 mmHg. IAS/Shunts: No atrial level shunt detected by color flow Doppler. Additional Comments: 3D was performed not requiring image post processing on an independent workstation and was indeterminate.  LEFT VENTRICLE PLAX 2D LVIDd:         4.00 cm   Diastology LVIDs:         2.30 cm   LV e' medial:    6.84 cm/s LV PW:         1.00 cm   LV E/e' medial:  16.5 LV IVS:        1.00 cm   LV e' lateral:   8.45 cm/s LVOT diam:     1.70 cm   LV E/e' lateral: 13.4 LV SV:         57 LV SV Index:   29 LVOT Area:     2.27 cm  RIGHT VENTRICLE RV S prime:     11.80 cm/s TAPSE (M-mode): 1.8 cm LEFT ATRIUM             Index        RIGHT ATRIUM           Index LA diam:        3.80 cm 1.92 cm/m   RA Area:     13.80 cm LA Vol (A2C):   28.9 ml 14.60 ml/m  RA Volume:   32.60 ml  16.47 ml/m LA Vol (A4C):   40.9 ml 20.67 ml/m LA Biplane Vol: 35.5 ml 17.94 ml/m  AORTIC VALVE AV Area (Vmax):    1.13 cm AV Area (Vmean):   1.31 cm AV Area (VTI):     1.16 cm AV Vmax:           230.00 cm/s AV Vmean:          143.000 cm/s AV VTI:            0.486 m AV Peak Grad:      21.2 mmHg AV Mean Grad:      10.0 mmHg LVOT Vmax:         114.00 cm/s LVOT Vmean:        82.800 cm/s LVOT VTI:          0.249 m LVOT/AV VTI ratio: 0.51  AORTA Ao Root diam: 3.00 cm MITRAL VALVE                TRICUSPID VALVE MV Area (PHT): 5.02 cm     TR Peak grad:   62.4 mmHg MV Decel Time: 151 msec     TR Vmax:        395.00 cm/s MV E velocity: 113.00 cm/s MV A velocity: 55.50 cm/s   SHUNTS MV E/A ratio:  2.04         Systemic VTI:  0.25 m                             Systemic Diam: 1.70 cm Jayson Sierras MD Electronically signed by Jayson Sierras MD Signature Date/Time: 10/19/2023/1:32:04 PM    Final     Scheduled Meds:  aspirin  EC  81 mg Oral Daily   atorvastatin   40 mg Oral QODAY   dapagliflozin  propanediol  10 mg Oral Daily   escitalopram   20 mg Oral Q0600   furosemide   80 mg Intravenous Q12H   insulin  aspart  0-15 Units  Subcutaneous TID WC   ipratropium  0.5 mg Nebulization TID   levalbuterol   0.63 mg Nebulization TID   sodium chloride  flush  3 mL Intravenous Q12H   sodium chloride  flush  3 mL Intravenous Q12H   Continuous Infusions:   LOS: 3 days   Ivonne Mustache, MD [facility] P9/26/2025, 8:49 AM

## 2023-10-21 NOTE — Plan of Care (Signed)

## 2023-10-21 NOTE — Progress Notes (Signed)
  Echocardiogram 2D Echocardiogram has been performed.  [name] 10/21/2023, 2:46 PM

## 2023-10-21 NOTE — Plan of Care (Signed)

## 2023-10-22 DIAGNOSIS — J9621 Acute and chronic respiratory failure with hypoxia: Secondary | ICD-10-CM | POA: Diagnosis not present

## 2023-10-22 LAB — GLUCOSE, CAPILLARY
Glucose-Capillary: 185 mg/dL — ABNORMAL HIGH (ref 70–99)
Glucose-Capillary: 232 mg/dL — ABNORMAL HIGH (ref 70–99)
Glucose-Capillary: 266 mg/dL — ABNORMAL HIGH (ref 70–99)
Glucose-Capillary: 348 mg/dL — ABNORMAL HIGH (ref 70–99)

## 2023-10-22 LAB — CBC
HCT: 42.4 % (ref 36.0–46.0)
Hemoglobin: 13.7 g/dL (ref 12.0–15.0)
MCH: 29 pg (ref 26.0–34.0)
MCHC: 32.3 g/dL (ref 30.0–36.0)
MCV: 89.8 fL (ref 80.0–100.0)
Platelets: 239 K/uL (ref 150–400)
RBC: 4.72 MIL/uL (ref 3.87–5.11)
RDW: 15.4 % (ref 11.5–15.5)
WBC: 6.2 K/uL (ref 4.0–10.5)
nRBC: 0 % (ref 0.0–0.2)

## 2023-10-22 LAB — ANTI-SCLERODERMA ANTIBODY: Scleroderma (Scl-70) (ENA) Antibody, IgG: <0.2 AI (ref 0.0–0.9)

## 2023-10-22 LAB — CENTROMERE ANTIBODIES: Centromere Ab Screen: <0.2 AI (ref 0.0–0.9)

## 2023-10-22 LAB — TSH: TSH: 4.819 u[IU]/mL — ABNORMAL HIGH (ref 0.350–4.500)

## 2023-10-22 LAB — BASIC METABOLIC PANEL WITH GFR
Anion gap: 13 (ref 5–15)
BUN: 26 mg/dL — ABNORMAL HIGH (ref 8–23)
CO2: 29 mmol/L (ref 22–32)
Calcium: 8.8 mg/dL — ABNORMAL LOW (ref 8.9–10.3)
Chloride: 93 mmol/L — ABNORMAL LOW (ref 98–111)
Creatinine, Ser: 1.09 mg/dL — ABNORMAL HIGH (ref 0.44–1.00)
GFR, Estimated: 56 mL/min — ABNORMAL LOW (ref >60)
Glucose, Bld: 238 mg/dL — ABNORMAL HIGH (ref 70–99)
Potassium: 3.2 mmol/L — ABNORMAL LOW (ref 3.5–5.1)
Sodium: 135 mmol/L (ref 135–145)

## 2023-10-22 LAB — ANTITHROMBIN III: AntiThromb III Func: 112 % (ref 75–120)

## 2023-10-22 LAB — ANA W/REFLEX IF POSITIVE: Anti Nuclear Antibody (ANA): NEGATIVE

## 2023-10-22 LAB — MAGNESIUM: Magnesium: 1.8 mg/dL (ref 1.7–2.4)

## 2023-10-22 MED ORDER — INSULIN ASPART 100 UNIT/ML IJ SOLN
0.0000 [IU] | Freq: Every day | INTRAMUSCULAR | Status: DC
  Administered 2023-10-22: 4 [IU] via SUBCUTANEOUS

## 2023-10-22 MED ORDER — POTASSIUM CHLORIDE CRYS ER 20 MEQ PO TBCR
40.0000 meq | EXTENDED_RELEASE_TABLET | Freq: Once | ORAL | Status: AC
  Administered 2023-10-22: 40 meq via ORAL
  Filled 2023-10-22: qty 2

## 2023-10-22 MED ORDER — INSULIN ASPART 100 UNIT/ML IJ SOLN
0.0000 [IU] | Freq: Three times a day (TID) | INTRAMUSCULAR | Status: DC
  Administered 2023-10-23: 5 [IU] via SUBCUTANEOUS
  Administered 2023-10-23 – 2023-10-24 (×2): 3 [IU] via SUBCUTANEOUS

## 2023-10-22 MED ORDER — MAGNESIUM SULFATE 2 GM/50ML IV SOLN
2.0000 g | Freq: Once | INTRAVENOUS | Status: AC
  Administered 2023-10-22: 2 g via INTRAVENOUS
  Filled 2023-10-22: qty 50

## 2023-10-22 MED ORDER — APIXABAN 5 MG PO TABS
5.0000 mg | ORAL_TABLET | Freq: Two times a day (BID) | ORAL | Status: AC
  Administered 2023-10-22 – 2023-10-23 (×3): 5 mg via ORAL
  Filled 2023-10-22 (×3): qty 1

## 2023-10-22 NOTE — Progress Notes (Signed)
 PROGRESS NOTE  [name]  FMW:[id number] DOB: 26-Mar-1955 DOA: 10/18/2023 PCP: [name], MD   Brief Narrative: Patient is a 68 year old female with history of pulmonary hypertension associated with right ventricular dysfunction, OSA on CPAP, asthmatic bronchitis, aortic stenosis, coronary artery disease, non-STEMI status post PCI, COPD, hypertension, hyperlipidemia, morbid obesity, diabetes type 2, anxiety who presented after she was referred from pulmonology office for the evaluation of acute on chronic hypoxic respiratory failure, increased cough, congestion, shortness of breath, dizziness.  On presentation, she was hypoxic and was put on 2 L of oxygen  per minute.  COVID/flu/RSV negative.  CTA chest negative for PE, showed dilation of main pulmonary artery reflecting underlying pulmonary hypertension.  Underwent right/left heart catheterization here.  Currently being managed for acute on chronic CHF exacerbation, pulmonary hypertension.  Cardiology following.  Currently on IV Lasix   Assessment & Plan:  Principal Problem:   Acute on chronic respiratory failure with hypoxia (HCC) Active Problems:   Acute exacerbation of CHF (congestive heart failure) (HCC)   Anxiety   Carotid artery stenosis   Essential hypertension   Depression   GERD (gastroesophageal reflux disease)   CAD (coronary artery disease)   COPD (chronic obstructive pulmonary disease) (HCC)   Mixed hyperlipidemia   Aortic stenosis   Type 2 diabetes mellitus with hyperglycemia (HCC)   DOE (dyspnea on exertion)   Obesity, Class I, BMI 30.0-34.9 (see actual BMI)   Hypomagnesemia   Pulmonary hypertension, unspecified (HCC)   Acute on chronic HFpEF/pulmonary hypertension: Presented with dyspnea with minimal exertion.  Found to be hypoxic on presentation.  CT chest negative for PE but showed dilation of main pulmonary artery consistent with pulmonary hypertension.  Elevated BNP.  2D echo showed hyperdynamic left  ventricular function with EF of 70 to 75%, grade 2 diastolic dysfunction, features suggesting right ventricular pressure and volume overload.  Heart failure team consulted and following.  Underwent right/left heart cath on 9/25 which showed mild nonobstructive coronary disease, decreased right heart function. Currently on IV Lasix . Elevated D-dimer so might need VQ scan to rule out chronic thromboembolic pulmonary hypertension. Will defer to cardiology. Also obtained rheumatoid factor, ANA, antihistone, antibody, centromere antibody to rule out autoimmune cause of elevated pulmonary artery hypertension.  Negative HIV.  Plan to repeat RHC hemodynamics after diuresis and addition of vasodilators.  Currently on sildenafil . Also on Farxiga   History of coronary artery disease: No anginal symptoms history of NSTEMI in 2016 treated with LAD stent.  LHC done on 9/25 showed nonobstructive disease and patent LAD  History of OSA: On CPAP  History of asthmatic bronchitis: Follows with Dr. Darlean.  Continue bronchodilators as needed.  Currently not wheezing  Hyperlipidemia: On statin  Hypertension: On Diovan , Imdur , metoprolol  at home.  Anxiety/depression: As needed Xanax .  Also on Lexapro   GERD: Continue PPI  Hypokalemia: Supplemented with potassium  Tobacco use: 30+ years smoking history.  Trying to quit.  Counseled cessation.  Continue nicotine  patch.  Still smokes 5 cigarettes a day  Diabetes type 2: Continue sliding scale insulin .  Also on Farxiga  now.  A1c of 7.3  History of bilateral carotid artery disease: Continue aspirin , statin.  Might repeat carotid Doppler .  Can be done as an outpatient        DVT prophylaxis:Lovenox      Code Status: Full Code  Family Communication: None at bedside  Patient status:Inpatient  Patient is from :Home  Anticipated discharge un:Ynfz  Estimated DC date:2-3 days, after cardiology clearance   Consultants:  Cardiology  Procedures: Left/right  heart cath  Antimicrobials:  Anti-infectives (From admission, onward)    None       Subjective: Patient seen and examined at bedside today.  Overall comfortable sitting at the edge of the bed.  Denies any worsening shortness of the cough.  Comfortable.  On room air  Objective: Vitals:   10/22/23 0001 10/22/23 0605 10/22/23 0800 10/22/23 0834  BP: (!) 105/43 (!) 163/77    Pulse: 63 62    Resp: 18 16  18   Temp: 97.9 F (36.6 C) 97.6 F (36.4 C)  98.8 F (37.1 C)  TempSrc: Oral Oral  Oral  SpO2: 92% 95% 95%   Weight:  88.3 kg    Height:        Intake/Output Summary (Last 24 hours) at 10/22/2023 1139 Last data filed at 10/22/2023 0858 Gross per 24 hour  Intake 240 ml  Output 5500 ml  Net -5260 ml   Filed Weights   10/20/23 0425 10/21/23 1003 10/22/23 0605  Weight: 92 kg 89 kg 88.3 kg    Examination:  General exam: Overall comfortable, not in distress HEENT: PERRL Respiratory system:  no wheezes or crackles , mildly diminished sounds on bases Cardiovascular system: S1 & S2 heard, RRR.  Gastrointestinal system: Abdomen is nondistended, soft and nontender. Central nervous system: Alert and oriented Extremities: No edema, no clubbing ,no cyanosis Skin: No rashes, no ulcers,no icterus     Data Reviewed: I have personally reviewed following labs and imaging studies  CBC: Recent Labs  Lab 10/18/23 1033 10/20/23 0852 10/20/23 1337 10/20/23 1339 10/20/23 1341 10/20/23 1355 10/21/23 0454 10/22/23 0453  WBC 6.6 6.8  --   --   --   --  5.2 6.2  NEUTROABS 4.6  --   --   --   --   --   --   --   HGB 13.0 13.8   < > 12.6 13.3 13.3 12.9 13.7  HCT 41.7 43.4   < > 37.0 39.0 39.0 40.0 42.4  MCV 93.7 91.2  --   --   --   --  90.7 89.8  PLT 228 201  --   --   --   --  209 239   < > = values in this interval not displayed.   Basic Metabolic Panel: Recent Labs  Lab 10/18/23 1033 10/18/23 1512 10/19/23 0408 10/20/23 0852 10/20/23 1337 10/20/23 1339 10/20/23 1341  10/20/23 1355 10/21/23 0454 10/22/23 0453  NA 140  --  139 138   < > 122* 141 141 139 135  K 4.0  --  4.0 3.6   < > <2.0* 3.3* 3.3* 3.3* 3.2*  CL 101  --  96* 98  --   --   --   --  96* 93*  CO2 25  --  28 26  --   --   --   --  28 29  GLUCOSE 163*  --  275* 174*  --   --   --   --  178* 238*  BUN 22  --  23 23  --   --   --   --  24* 26*  CREATININE 0.86  --  0.82 0.81  --   --   --   --  0.88 1.09*  CALCIUM  8.8*  --  8.6* 9.0  --   --   --   --  8.4* 8.8*  MG 1.3* 1.7  --   --   --   --   --   --   --   --  PHOS  --  4.2  --   --   --   --   --   --   --   --    < > = values in this interval not displayed.     Recent Results (from the past 240 hours)  Resp panel by RT-PCR (RSV, Flu A&B, Covid) Anterior Nasal Swab     Status: None   Collection Time: 10/18/23 10:14 AM   Specimen: Anterior Nasal Swab  Result Value Ref Range Status   SARS Coronavirus 2 by RT PCR NEGATIVE NEGATIVE Final    Comment: (NOTE) SARS-CoV-2 target nucleic acids are NOT DETECTED.  The SARS-CoV-2 RNA is generally detectable in upper respiratory specimens during the acute phase of infection. The lowest concentration of SARS-CoV-2 viral copies this assay can detect is 138 copies/mL. A negative result does not preclude SARS-Cov-2 infection and should not be used as the sole basis for treatment or other patient management decisions. A negative result may occur with  improper specimen collection/handling, submission of specimen other than nasopharyngeal swab, presence of viral mutation(s) within the areas targeted by this assay, and inadequate number of viral copies(<138 copies/mL). A negative result must be combined with clinical observations, patient history, and epidemiological information. The expected result is Negative.  Fact Sheet for Patients:  BloggerCourse.com  Fact Sheet for Healthcare Providers:  SeriousBroker.it  This test is no t yet approved  or cleared by the [place]  FDA and  has been authorized for detection and/or diagnosis of SARS-CoV-2 by FDA under an Emergency Use Authorization (EUA). This EUA will remain  in effect (meaning this test can be used) for the duration of the COVID-19 declaration under Section 564(b)(1) of the Act, 21 U.S.C.section 360bbb-3(b)(1), unless the authorization is terminated  or revoked sooner.       Influenza A by PCR NEGATIVE NEGATIVE Final   Influenza B by PCR NEGATIVE NEGATIVE Final    Comment: (NOTE) The Xpert Xpress SARS-CoV-2/FLU/RSV plus assay is intended as an aid in the diagnosis of influenza from Nasopharyngeal swab specimens and should not be used as a sole basis for treatment. Nasal washings and aspirates are unacceptable for Xpert Xpress SARS-CoV-2/FLU/RSV testing.  Fact Sheet for Patients: BloggerCourse.com  Fact Sheet for Healthcare Providers: SeriousBroker.it  This test is not yet approved or cleared by the [place]  FDA and has been authorized for detection and/or diagnosis of SARS-CoV-2 by FDA under an Emergency Use Authorization (EUA). This EUA will remain in effect (meaning this test can be used) for the duration of the COVID-19 declaration under Section 564(b)(1) of the Act, 21 U.S.C. section 360bbb-3(b)(1), unless the authorization is terminated or revoked.     Resp Syncytial Virus by PCR NEGATIVE NEGATIVE Final    Comment: (NOTE) Fact Sheet for Patients: BloggerCourse.com  Fact Sheet for Healthcare Providers: SeriousBroker.it  This test is not yet approved or cleared by the [place]  FDA and has been authorized for detection and/or diagnosis of SARS-CoV-2 by FDA under an Emergency Use Authorization (EUA). This EUA will remain in effect (meaning this test can be used) for the duration of the COVID-19 declaration under Section 564(b)(1) of the Act,  21 U.S.C. section 360bbb-3(b)(1), unless the authorization is terminated or revoked.  Performed at [facility], [address], [place], [place] [address]      Radiology Studies: NM Pulmonary Perfusion Result Date: 10/21/2023 CLINICAL DATA:  Pulmonary hypertension. EXAM: NUCLEAR MEDICINE PERFUSION LUNG SCAN TECHNIQUE: Perfusion images were obtained in multiple projections  after intravenous injection of radiopharmaceutical. RADIOPHARMACEUTICALS:  4.4 mCi Tc-20m MAA COMPARISON:  Chest radiograph 10/18/2023, CT 10/18/2023 FINDINGS: There are several peripheral perfusion defects. One in the lateral RIGHT lower lobe. Probable defect in the posterior LEFT lower lobe. Defect at the RIGHT lung base additionally. IMPRESSION: Several small peripheral perfusion defects. Negative CTPA. 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 Electronically Signed   By: Jackquline Boxer M.D.   On: 10/21/2023 15:01   ECHOCARDIOGRAM COMPLETE BUBBLE STUDY Result Date: 10/21/2023    ECHOCARDIOGRAM REPORT   Patient Name:   MAURICIA MERTENS Date of Exam: 10/21/2023 Medical Rec #:  [id number]      Height:       64.0 in Accession #:    [id number]     Weight:       196.1 lb Date of Birth:  01-25-1956      BSA:          1.940 m Patient Age:    67 years       BP:           110/56 mmHg Patient Gender: F              HR:           59 bpm. Exam Location:  Inpatient Procedure: 2D Echo and Saline Contrast Bubble Study (Both Spectral and Color            Flow Doppler were utilized during procedure). Indications:    CHF Pulm HTN  History:        Patient has prior history of Echocardiogram examinations. CHF,                 Pulmonary HTN; Signs/Symptoms:Dyspnea.  Sonographer:    Norleen Amour Referring Phys: [id number] BRITTAINY M SIMMONS IMPRESSIONS  1. Cannot exclude a small PFO. Agitated saline contrast bubble study was positive with shunting observed within 3-6 cardiac cycles suggestive of interatrial shunt.  2. Left ventricular ejection  fraction, by estimation, is 65 to 70%. The left ventricle has normal function. The left ventricle has no regional wall motion abnormalities. Left ventricular diastolic parameters were normal.  3. Right ventricular systolic function is normal. The right ventricular size is normal. There is mildly elevated pulmonary artery systolic pressure. The estimated right ventricular systolic pressure is 38.0 mmHg.  4. The mitral valve is normal in structure. Trivial mitral valve regurgitation. No evidence of mitral stenosis.  5. The aortic valve is calcified. There is moderate calcification of the aortic valve. There is mild thickening of the aortic valve. Aortic valve regurgitation is not visualized. Mild aortic valve stenosis. Aortic valve mean gradient measures 9.3 mmHg. Aortic valve Vmax measures 2.12 m/s.  6. The inferior vena cava is normal in size with greater than 50% respiratory variability, suggesting right atrial pressure of 3 mmHg. FINDINGS  Left Ventricle: Left ventricular ejection fraction, by estimation, is 65 to 70%. The left ventricle has normal function. The left ventricle has no regional wall motion abnormalities. The left ventricular internal cavity size was normal in size. There is  no left ventricular hypertrophy. Left ventricular diastolic parameters were normal. Right Ventricle: The right ventricular size is normal. No increase in right ventricular wall thickness. Right ventricular systolic function is normal. There is mildly elevated pulmonary artery systolic pressure. The tricuspid regurgitant velocity is 2.40  m/s, and with an assumed right atrial pressure of 15 mmHg, the estimated right ventricular systolic pressure is 38.0 mmHg. Left Atrium: Left atrial size was normal in  size. Right Atrium: Right atrial size was normal in size. Pericardium: There is no evidence of pericardial effusion. Mitral Valve: The mitral valve is normal in structure. Trivial mitral valve regurgitation. No evidence of mitral  valve stenosis. Tricuspid Valve: The tricuspid valve is normal in structure. Tricuspid valve regurgitation is mild . No evidence of tricuspid stenosis. Aortic Valve: The aortic valve is calcified. There is moderate calcification of the aortic valve. There is mild thickening of the aortic valve. Aortic valve regurgitation is not visualized. Mild aortic stenosis is present. Aortic valve mean gradient measures 9.3 mmHg. Aortic valve peak gradient measures 18.0 mmHg. Aortic valve area, by VTI measures 0.76 cm. Pulmonic Valve: The pulmonic valve was normal in structure. Pulmonic valve regurgitation is trivial. No evidence of pulmonic stenosis. Aorta: The aortic root is normal in size and structure. Venous: The inferior vena cava is normal in size with greater than 50% respiratory variability, suggesting right atrial pressure of 3 mmHg. IAS/Shunts: Cannot exclude a small PFO. Agitated saline contrast was given intravenously to evaluate for intracardiac shunting. Agitated saline contrast bubble study was positive with shunting observed within 3-6 cardiac cycles suggestive of interatrial shunt.  LEFT VENTRICLE PLAX 2D LVIDd:         4.50 cm     Diastology LVIDs:         2.50 cm     LV e' medial:    8.26 cm/s LV PW:         0.70 cm     LV E/e' medial:  13.1 LV IVS:        0.60 cm     LV e' lateral:   8.55 cm/s LVOT diam:     1.40 cm     LV E/e' lateral: 12.6 LV SV:         34 LV SV Index:   18 LVOT Area:     1.54 cm  LV Volumes (MOD) LV vol d, MOD A2C: 63.3 ml LV vol d, MOD A4C: 54.3 ml LV vol s, MOD A2C: 22.1 ml LV vol s, MOD A4C: 13.1 ml LV SV MOD A2C:     41.2 ml LV SV MOD A4C:     54.3 ml LV SV MOD BP:      42.2 ml RIGHT VENTRICLE             IVC RV Basal diam:  3.40 cm     IVC diam: 1.90 cm RV S prime:     12.10 cm/s TAPSE (M-mode): 1.9 cm LEFT ATRIUM             Index        RIGHT ATRIUM           Index LA diam:        3.40 cm 1.75 cm/m   RA Area:     12.00 cm LA Vol (A2C):   31.5 ml 16.24 ml/m  RA Volume:   28.70  ml  14.79 ml/m LA Vol (A4C):   34.0 ml 17.53 ml/m LA Biplane Vol: 35.7 ml 18.40 ml/m  AORTIC VALVE                     PULMONIC VALVE AV Area (Vmax):    0.73 cm      PV Vmax:       0.89 m/s AV Area (Vmean):   0.71 cm      PV Peak grad:  3.2 mmHg AV Area (VTI):  0.76 cm AV Vmax:           212.00 cm/s AV Vmean:          141.333 cm/s AV VTI:            0.452 m AV Peak Grad:      18.0 mmHg AV Mean Grad:      9.3 mmHg LVOT Vmax:         101.00 cm/s LVOT Vmean:        65.000 cm/s LVOT VTI:          0.223 m LVOT/AV VTI ratio: 0.49  AORTA Ao Root diam: 2.30 cm Ao Asc diam:  2.90 cm MITRAL VALVE                TRICUSPID VALVE MV Area (PHT): 3.46 cm     TR Peak grad:   23.0 mmHg MV Decel Time: 219 msec     TR Vmax:        240.00 cm/s MV E velocity: 108.00 cm/s MV A velocity: 59.70 cm/s   SHUNTS MV E/A ratio:  1.81         Systemic VTI:  0.22 m                             Systemic Diam: 1.40 cm Oneil Parchment MD Electronically signed by Oneil Parchment MD Signature Date/Time: 10/21/2023/2:50:19 PM    Final    CARDIAC CATHETERIZATION Result Date: 10/20/2023   Prox LAD lesion is 10% stenosed.   Mid RCA lesion is 30% stenosed.   Previously placed Mid LAD to Dist LAD stent of unknown type is  widely patent. 1.  Mild, nonobstructive coronary artery disease with patent mid to distal LAD stent. 2.  Fick cardiac output of 3.4 liters per minute and Fick cardiac index of 1.7 L/min/m      TD cardiac output of 3.7 L/min and TD cardiac output of 1.9 L/min/m   RA pressure mean of 22 mmHg   RV pressure 114/4 with an end-diastolic pressure of 24 mmHg   PA pressure 114/35 with a mean PA pressure of 64 mmHg   Wedge pressure mean of 26 mmHg   PA pulsatility index of 3.6   PVR of 11.2 Woods units by Fick; 10.3 Woods units by TD 3.  LVEDP of 29 mmHg 4.  Breathing room air the patient's noninvasive oxygen  saturation was in the 80s; ABG drawn in the cardiac catheterization laboratory demonstrated a pH of 7.4, pCO2 of 50, and PaO2 of 46  Summary: Elevated biventricular filling pressures with wedge of 26 mmHg, RA of 22 mmHg, and PVR of greater than 10 Wood units.  Recommend advanced heart failure consultation.    Scheduled Meds:  aspirin  EC  81 mg Oral Daily   atorvastatin   40 mg Oral QODAY   dapagliflozin  propanediol  10 mg Oral Daily   escitalopram   20 mg Oral Q0600   furosemide   80 mg Intravenous Q12H   insulin  aspart  0-15 Units Subcutaneous TID WC   ipratropium  0.5 mg Nebulization TID   levalbuterol   0.63 mg Nebulization TID   sildenafil   20 mg Oral TID   sodium chloride  flush  3 mL Intravenous Q12H   sodium chloride  flush  3 mL Intravenous Q12H   Continuous Infusions:   LOS: 4 days   Ivonne Mustache, MD [facility] P9/27/2025, 11:39 AM

## 2023-10-22 NOTE — Discharge Instructions (Signed)
 Information on my medicine - ELIQUIS  (apixaban )  Why was Eliquis  prescribed for you? Eliquis  was prescribed to treat blood clots that may have been found in the veins of your legs (deep vein thrombosis) or in your lungs (pulmonary embolism) and to reduce the risk of them occurring again.  What do You need to know about Eliquis  ? The  dose is  ONE 5 mg tablet taken TWICE daily.  Eliquis  may be taken with or without food.   Try to take the dose about the same time in the morning and in the evening. If you have difficulty swallowing the tablet whole please discuss with your pharmacist how to take the medication safely.  Take Eliquis  exactly as prescribed and DO NOT stop taking Eliquis  without talking to the doctor who prescribed the medication.  Stopping may increase your risk of developing a new blood clot.  Refill your prescription before you run out.  After discharge, you should have regular check-up appointments with your healthcare provider that is prescribing your Eliquis .    What do you do if you miss a dose? If a dose of ELIQUIS  is not taken at the scheduled time, take it as soon as possible on the same day and twice-daily administration should be resumed. The dose should not be doubled to make up for a missed dose.  Important Safety Information A possible side effect of Eliquis  is bleeding. You should call your healthcare provider right away if you experience any of the following: ? Bleeding from an injury or your nose that does not stop. ? Unusual colored urine (red or dark brown) or unusual colored stools (red or black). ? Unusual bruising for unknown reasons. ? A serious fall or if you hit your head (even if there is no bleeding).  Some medicines may interact with Eliquis  and might increase your risk of bleeding or clotting while on Eliquis . To help avoid this, consult your healthcare provider or pharmacist prior to using any new prescription or non-prescription  medications, including herbals, vitamins, non-steroidal anti-inflammatory drugs (NSAIDs) and supplements.  This website has more information on Eliquis  (apixaban ): http://[email or web address] .com/eliquis dena

## 2023-10-22 NOTE — Progress Notes (Signed)
 Patient's glucose 348 this pm, asked MD for HS insulin  coverage with new orders given.  Also verified ok to proceed with Eliquis with labs pending and Dr. Alena Cort Flatten verified ok to give medication.

## 2023-10-22 NOTE — Progress Notes (Addendum)
 Progress Note  Patient Name: [name] Date of Encounter: 10/22/2023  Primary Cardiologist: Jayson Sierras, MD  Subjective   Feels significantly better than on presentation. She and her friend at beside remark she now has skinny legs. 96% RA  Inpatient Medications    Scheduled Meds:  aspirin  EC  81 mg Oral Daily   atorvastatin   40 mg Oral QODAY   dapagliflozin  propanediol  10 mg Oral Daily   escitalopram   20 mg Oral Q0600   insulin  aspart  0-15 Units Subcutaneous TID WC   ipratropium  0.5 mg Nebulization TID   levalbuterol   0.63 mg Nebulization TID   sildenafil   20 mg Oral TID   sodium chloride  flush  3 mL Intravenous Q12H   Continuous Infusions:  PRN Meds: acetaminophen  **OR** acetaminophen , acetaminophen , bisacodyl , hydrALAZINE , HYDROmorphone  (DILAUDID ) injection, nystatin  cream, ondansetron  **OR** ondansetron  (ZOFRAN ) IV, oxyCODONE , senna-docusate, traZODone    Vital Signs    Vitals:   10/22/23 0800 10/22/23 0834 10/22/23 1159 10/22/23 1409  BP:      Pulse:      Resp:  18 18   Temp:  98.8 F (37.1 C) 98.8 F (37.1 C)   TempSrc:  Oral Oral   SpO2: 95%   96%  Weight:      Height:        Intake/Output Summary (Last 24 hours) at 10/22/2023 1416 Last data filed at 10/22/2023 1200 Gross per 24 hour  Intake 240 ml  Output 7500 ml  Net -7260 ml      10/22/2023    6:05 AM 10/21/2023   10:03 AM 10/20/2023    4:25 AM  Last 3 Weights  Weight (lbs) 194 lb 9.6 oz 196 lb 1.6 oz 202 lb 13.2 oz  Weight (kg) 88.27 kg 88.95 kg 92 kg     Telemetry    NSR, occ PACs/PVCs and occasional ventricular bigeminy - Personally Reviewed  Physical Exam   GEN: No acute distress.  HEENT: Normocephalic, atraumatic, sclera non-icteric. Neck: No JVD or bruits. Cardiac: RRR no murmurs, rubs, or gallops.  Respiratory: Diffusely diminished bilaterally. Breathing is unlabored. GI: Soft, nontender, non-distended, BS +x 4. MS: no deformity. Extremities: No clubbing or cyanosis.  No edema. Distal pedal pulses are 2+ and equal bilaterally. Neuro:  AAOx3. Follows commands. Psych:  Responds to questions appropriately with a normal affect.  Labs    High Sensitivity Troponin:   Recent Labs  Lab 09/28/23 1028 09/28/23 1235 10/18/23 1033 10/18/23 1234  TROPONINIHS 7 7 7 7       Cardiac EnzymesNo results for input(s): TROPONINI in the last 168 hours. No results for input(s): TROPIPOC in the last 168 hours.   Chemistry Recent Labs  Lab 10/18/23 1033 10/19/23 0408 10/20/23 0852 10/20/23 1337 10/20/23 1355 10/21/23 0454 10/22/23 0453  NA 140   < > 138   < > 141 139 135  K 4.0   < > 3.6   < > 3.3* 3.3* 3.2*  CL 101   < > 98  --   --  96* 93*  CO2 25   < > 26  --   --  28 29  GLUCOSE 163*   < > 174*  --   --  178* 238*  BUN 22   < > 23  --   --  24* 26*  CREATININE 0.86   < > 0.81  --   --  0.88 1.09*  CALCIUM  8.8*   < > 9.0  --   --  8.4* 8.8*  PROT 6.9  --   --   --   --   --   --   ALBUMIN  3.3*  --   --   --   --   --   --   AST 26  --   --   --   --   --   --   ALT 25  --   --   --   --   --   --   ALKPHOS 85  --   --   --   --   --   --   BILITOT 0.5  --   --   --   --   --   --   GFRNONAA >60   < > >60  --   --  >60 56*  ANIONGAP 14   < > 14  --   --  15 13   < > = values in this interval not displayed.     Hematology Recent Labs  Lab 10/20/23 9147 10/20/23 1337 10/20/23 1355 10/21/23 0454 10/22/23 0453  WBC 6.8  --   --  5.2 6.2  RBC 4.76  --   --  4.41 4.72  HGB 13.8   < > 13.3 12.9 13.7  HCT 43.4   < > 39.0 40.0 42.4  MCV 91.2  --   --  90.7 89.8  MCH 29.0  --   --  29.3 29.0  MCHC 31.8  --   --  32.3 32.3  RDW 15.9*  --   --  15.6* 15.4  PLT 201  --   --  209 239   < > = values in this interval not displayed.    BNP Recent Labs  Lab 10/18/23 1033 10/19/23 0408 10/21/23 0454  BNP 373.0* 545.0* 289.0*     DDimer  Recent Labs  Lab 10/18/23 1033  DDIMER 3.12*     Radiology    NM Pulmonary Perfusion Result Date:  10/21/2023 CLINICAL DATA:  Pulmonary hypertension. EXAM: NUCLEAR MEDICINE PERFUSION LUNG SCAN TECHNIQUE: Perfusion images were obtained in multiple projections after intravenous injection of radiopharmaceutical. RADIOPHARMACEUTICALS:  4.4 mCi Tc-70m MAA COMPARISON:  Chest radiograph 10/18/2023, CT 10/18/2023 FINDINGS: There are several peripheral perfusion defects. One in the lateral RIGHT lower lobe. Probable defect in the posterior LEFT lower lobe. Defect at the RIGHT lung base additionally. IMPRESSION: Several small peripheral perfusion defects. Negative CTPA. 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 Electronically Signed   By: Jackquline Boxer M.D.   On: 10/21/2023 15:01   ECHOCARDIOGRAM COMPLETE BUBBLE STUDY Result Date: 10/21/2023    ECHOCARDIOGRAM REPORT   Patient Name:   [name] Date of Exam: 10/21/2023 Medical Rec #:  [id number]      Height:       64.0 in Accession #:    [id number]     Weight:       196.1 lb Date of Birth:  07/02/1955      BSA:          1.940 m Patient Age:    67 years       BP:           110/56 mmHg Patient Gender: F              HR:           59 bpm. Exam Location:  Inpatient Procedure: 2D Echo and Saline Contrast Bubble Study (Both Spectral and Color  Flow Doppler were utilized during procedure). Indications:    CHF Pulm HTN  History:        Patient has prior history of Echocardiogram examinations. CHF,                 Pulmonary HTN; Signs/Symptoms:Dyspnea.  Sonographer:    Norleen Amour Referring Phys: [id number] BRITTAINY M SIMMONS IMPRESSIONS  1. Cannot exclude a small PFO. Agitated saline contrast bubble study was positive with shunting observed within 3-6 cardiac cycles suggestive of interatrial shunt.  2. Left ventricular ejection fraction, by estimation, is 65 to 70%. The left ventricle has normal function. The left ventricle has no regional wall motion abnormalities. Left ventricular diastolic parameters were normal.  3. Right ventricular systolic function  is normal. The right ventricular size is normal. There is mildly elevated pulmonary artery systolic pressure. The estimated right ventricular systolic pressure is 38.0 mmHg.  4. The mitral valve is normal in structure. Trivial mitral valve regurgitation. No evidence of mitral stenosis.  5. The aortic valve is calcified. There is moderate calcification of the aortic valve. There is mild thickening of the aortic valve. Aortic valve regurgitation is not visualized. Mild aortic valve stenosis. Aortic valve mean gradient measures 9.3 mmHg. Aortic valve Vmax measures 2.12 m/s.  6. The inferior vena cava is normal in size with greater than 50% respiratory variability, suggesting right atrial pressure of 3 mmHg. FINDINGS  Left Ventricle: Left ventricular ejection fraction, by estimation, is 65 to 70%. The left ventricle has normal function. The left ventricle has no regional wall motion abnormalities. The left ventricular internal cavity size was normal in size. There is  no left ventricular hypertrophy. Left ventricular diastolic parameters were normal. Right Ventricle: The right ventricular size is normal. No increase in right ventricular wall thickness. Right ventricular systolic function is normal. There is mildly elevated pulmonary artery systolic pressure. The tricuspid regurgitant velocity is 2.40  m/s, and with an assumed right atrial pressure of 15 mmHg, the estimated right ventricular systolic pressure is 38.0 mmHg. Left Atrium: Left atrial size was normal in size. Right Atrium: Right atrial size was normal in size. Pericardium: There is no evidence of pericardial effusion. Mitral Valve: The mitral valve is normal in structure. Trivial mitral valve regurgitation. No evidence of mitral valve stenosis. Tricuspid Valve: The tricuspid valve is normal in structure. Tricuspid valve regurgitation is mild . No evidence of tricuspid stenosis. Aortic Valve: The aortic valve is calcified. There is moderate calcification of  the aortic valve. There is mild thickening of the aortic valve. Aortic valve regurgitation is not visualized. Mild aortic stenosis is present. Aortic valve mean gradient measures 9.3 mmHg. Aortic valve peak gradient measures 18.0 mmHg. Aortic valve area, by VTI measures 0.76 cm. Pulmonic Valve: The pulmonic valve was normal in structure. Pulmonic valve regurgitation is trivial. No evidence of pulmonic stenosis. Aorta: The aortic root is normal in size and structure. Venous: The inferior vena cava is normal in size with greater than 50% respiratory variability, suggesting right atrial pressure of 3 mmHg. IAS/Shunts: Cannot exclude a small PFO. Agitated saline contrast was given intravenously to evaluate for intracardiac shunting. Agitated saline contrast bubble study was positive with shunting observed within 3-6 cardiac cycles suggestive of interatrial shunt.  LEFT VENTRICLE PLAX 2D LVIDd:         4.50 cm     Diastology LVIDs:         2.50 cm     LV e' medial:    8.26 cm/s  LV PW:         0.70 cm     LV E/e' medial:  13.1 LV IVS:        0.60 cm     LV e' lateral:   8.55 cm/s LVOT diam:     1.40 cm     LV E/e' lateral: 12.6 LV SV:         34 LV SV Index:   18 LVOT Area:     1.54 cm  LV Volumes (MOD) LV vol d, MOD A2C: 63.3 ml LV vol d, MOD A4C: 54.3 ml LV vol s, MOD A2C: 22.1 ml LV vol s, MOD A4C: 13.1 ml LV SV MOD A2C:     41.2 ml LV SV MOD A4C:     54.3 ml LV SV MOD BP:      42.2 ml RIGHT VENTRICLE             IVC RV Basal diam:  3.40 cm     IVC diam: 1.90 cm RV S prime:     12.10 cm/s TAPSE (M-mode): 1.9 cm LEFT ATRIUM             Index        RIGHT ATRIUM           Index LA diam:        3.40 cm 1.75 cm/m   RA Area:     12.00 cm LA Vol (A2C):   31.5 ml 16.24 ml/m  RA Volume:   28.70 ml  14.79 ml/m LA Vol (A4C):   34.0 ml 17.53 ml/m LA Biplane Vol: 35.7 ml 18.40 ml/m  AORTIC VALVE                     PULMONIC VALVE AV Area (Vmax):    0.73 cm      PV Vmax:       0.89 m/s AV Area (Vmean):   0.71 cm      PV  Peak grad:  3.2 mmHg AV Area (VTI):     0.76 cm AV Vmax:           212.00 cm/s AV Vmean:          141.333 cm/s AV VTI:            0.452 m AV Peak Grad:      18.0 mmHg AV Mean Grad:      9.3 mmHg LVOT Vmax:         101.00 cm/s LVOT Vmean:        65.000 cm/s LVOT VTI:          0.223 m LVOT/AV VTI ratio: 0.49  AORTA Ao Root diam: 2.30 cm Ao Asc diam:  2.90 cm MITRAL VALVE                TRICUSPID VALVE MV Area (PHT): 3.46 cm     TR Peak grad:   23.0 mmHg MV Decel Time: 219 msec     TR Vmax:        240.00 cm/s MV E velocity: 108.00 cm/s MV A velocity: 59.70 cm/s   SHUNTS MV E/A ratio:  1.81         Systemic VTI:  0.22 m                             Systemic Diam: 1.40 cm Oneil Parchment MD Electronically signed by Oneil Parchment MD  Signature Date/Time: 10/21/2023/2:50:19 PM    Final    Patient Profile     68 y/o female w/ h/o chronic asthmatic bronchitis followed by Dr. Darlean, tobacco abuse, OSA on CPAP, Obesity, pulmonary HTN w/ associated RV dysfunction, CAD s/p anterior STEMI w/ DES to LAD in 2018, PSVT, moderate carotid disease by duplex 2023, recent outpatient evaluation for syncope admitted for acute hypoxic respiratory failure and found to have a/c HFpEF w/ prominent RV dysfunction in the setting of severe PAH.   Assessment & Plan    1. Severe PH, acute hypoxic respiratory failure - Echo 10/19/23 EF 70-75%, GIIDD, interventricular septum is flattened in systole and diastole consistent with right ventricular pressure and volume overload, RV mildly enlarged w/ mild-moderately reduced SFx, estimated RVSP 77 mmHg, mild-mod TR, IVC dilated  with <50% respiratory variability suggesting right atrial pressure of 15 mmHg - RHC c/w severe pre and post capillary PH>>RA 22, PAP 114/35, PCW 26, PVR 11.2 WU, PAPi 3.6 (unreliable in setting of high PAP). - Imdur  washout completed, started on sildenafil , OP plan per MD - diuresed significantly with IV Lasix  this admission -14.6L with significant improvement in SOB - stop IV  Lasix  today, consider transition to torsemide - slight bump in BUN/Cr - K 3.2 -> s/p 2 doses 40meq KCl, further plan TBD based on diuretic plan - I cancelled OP echo 10/3 since she had it done in hospital - CTA 10/18/23 negative for PE, lungs clear - VQ scan 10/21/23: Several small peripheral perfusion defects. Negative CTPA. 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 Discussed with pulm who recommended to discuss with radiology. Per discussion with radiologist, technical criteria of perfusion defects seen meets intermediate risk, but visually looked like appearance of chronic PE. D/w Dr. Rolan who recommends to start Eliquis 5mg  BID (he did not feel loading dose felt needed given suspected chronicity). We will also send hypercoag panel - note this is being obtained PRIOR to start of anticoagulation. I relayed request to nurse to ensure the labs are drawn prior to first dose of Eliquis. ? Whether we need to continue ASA once this is on board. - CTD serologies in process (Rheum Factor, ANA, Anti Scleroderma Antibody, Centromere Antibodies) - HIV (-) - continue CPAP for OSA   - PFTs 12/24 demonstrated restriction and moderate diffusion defect. Minimal OAD. Plan HRCT to check for ILD per notes, ?outpatient - notes outline plan for repeat RHC hemodynamics after diuresis and addition of vasodilators, anticipate this is planned outpatient - will also request ambulatory O2 sat this afternoon off O2, asked nurse to record in progress note   2. Acute on chronic HFpEF w/ Prominent RV Dysfunction  - diuresis plan as above - ? blocker stopped due to low output  - digoxin stopped given periodic bradycardia, HR dropped to the 40s  -> resolved - on SGLT2i, continue Farxiga  10 mg daily    3. CAD - h/o STEMI in 2016 treated w/ LAD stent - LHC this admission w/ nonobstructive dz and patent LAD stent  - continue medical management with ASA, statin   4. OSA/OHS - continue CPAP  - PFTs 12/24  demonstrated restriction c/w body habitus. Needs wt loss. Would benefit from GLP1, can refer as outpaitent   5. Tobacco Use - smoker x 30+ years, trying to quit, now down to 1/4 ppd - encouraged cessation    6. Type 2DM -  Hgb A1c  7.3 - w/ DM + OSA and obesity would benefit from GLP1  7. Bilateral Carotid Artery Disease - 50- 69% pRICA and 50-69% LICA stenosis on last assessment 10/23 - overdue for repeat evaluation -> I have sent msg to [facility] team to help arrange upon dc from hospital - continue ASA 81 + statin  + tight BP control    8. Occasional PVCs, ventricular bigeminy, occasional PACs - BB avoided due to low output - repleting K above - Mg 1.8 -> replete with Mg sulfate  9. Syncope - recently seen as outpatient for this, completed 14 day monitor and mailed back, result pending - ? Relationship to pulmonary HTN - await input from MD regarding this with relationship to her diagnoses this admission as well as any specific activity restrictions  Dispo: await MD. I have sent a message to the [facility] clinic scheduling team to help with close follow-up.  For questions or updates, please contact [facility] Please consult [email or web address] for contact info under Cardiology/STEMI.  Signed, Dayna N Dunn, PA-C 10/22/2023, 2:16 PM    Patient seen with PA, I formulated the plan and agree with the above note.   V/Q scan concerning for chronic PEs, Dr. Annella with pulmonary reviewed and agrees with this interpretation.  She has no history of known clotting.    Breathing much improved, good diuresis yesterday. Creatinine mildly higher 1.09.  Weight down 2 lbs.    General: NAD Neck: No JVD, no thyromegaly or thyroid  nodule.  Lungs: Clear to auscultation bilaterally with normal respiratory effort. CV: Nondisplaced PMI.  Heart regular S1/S2, no S3/S4, no murmur.  No peripheral edema.  Abdomen: Soft, nontender, no hepatosplenomegaly, no distention.  Skin: Intact  without lesions or rashes.  Neurologic: Alert and oriented x 3.  Psych: Normal affect. Extremities: No clubbing or cyanosis.  HEENT: Normal.   1. Pulmonary hypertension: Patient has severe pulmonary hypertension with evidence for RV dysfunction by echo.  Echo showed EF 70-75% with D-shaped interventricular septum, mild RV enlargement with mild-moderate RV dysfunction, PASP 77 mmHg, dilated IVC, mild aortic stenosis.   RHC showed elevated right and left heart filling pressures with mean PAP 64 mmHg, PVR 10 WU.  This appears to be combined group 1 and 2 PH, primarily group 1.  CTA chest with no significant lung parenchymal disease and no acute PE.  Prior pulmonary workup revealed OSA and mild asthma.  Patient is a prior smoker.  She is on CPAP at night.  She is not hypoxemic at rest. I do not think that OSA can explain the extent of her PH.  CI was low, 1.7 by Fick and 1.9 by thermodilution.  This is an ominous findings.  Her episodes of presyncope are also concerning.  V/Q scan concerning for chronic PE, reviewed with pulmonary.  Bubble study suggestive of PFO. HIV negative rheumatologic serology workup pending.  She looks euvolemic on exam.  - Pending rheum serologies.  - HRCT to rule out ILD, significant parenchymal disease.   - She is on sildenafil  20 mg tid.  Would like to transition to riociguat as outpatient.  With elevated PCWP on RHC, will aim to repeat RHC Monday to document PCWP in normal limits so we can get riociguat. I discussed risks/benefits with patient and she agrees to the procedure.  - She will need close outpatient followup to for aggressive titration of pulmonary vasodilators.  - Will need referral to [facility] CTEPH program.  - Hold off on digoxin with periodic mild bradycardia.  2. Acute diastolic CHF: Elevated PCWP in setting of normal  EF.  She has diuresed well, looks euvolemic on exam.  - She had Lasix  80 mg IV x 1 today, stop IV Lasix .  Start po diuretic tomorrow if creatinine  stable.  - Continue Farxiga  10 mg daily.   3. CAD: H/o anterior MI.  LHC today with patent LAD stent, nonobstructive disease.  - Stop ASA with apixaban use (see below).  - Continue statin.  4. OSA: Continue CPAP.  5. Active smoker: Prior PFTs not consistent with COPD and emphysema not noted on CTA chest.  Further assessment for pulmonary parenchymal disease with HRCT chest.   Needs to quit smoking.  6. Type 2 diabetes: per primary.  7. Carotid stenosis: Needs carotid dopplers. 8. 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 Reviewed with pulmonary.  She has no h/o clots.   - Will send hypercoagulable workup.  - Start Eliquis.  - Will get lower extremity venous dopplers to look for DVT.   Ezra Shuck 10/22/2023 3:30 PM

## 2023-10-22 NOTE — Progress Notes (Signed)
 Notified by lab that recently collected items will not result due to insufficient blood received for testing.  Patient is pending multiple lab results for clot disorder, et-cetera to further characterize apparent risk factors and etiology of chronic PE identified per NM ventilation and perfusion study done 10/21/23.  This RN was instructed earlier by phone per Dayna Dunn, PA-C for [facility] not to give newly prescribed Eliquis prior to result of these laboratory values.  Requested patient credit for laboratory samples already drawn and instructed phlebotomy to re-draw.  Paged on-call for [facility] to make aware and to obtain further instructions related to Eliquis, if needed.  No further action at this time.

## 2023-10-22 NOTE — Progress Notes (Signed)
 Patient receiving furosemide  80mg  IV q12hrs with exceptional urine output for the past 24hrs.  Creatinine increased from baseline at 1.09 and serum potassium 3.39mmol/L (3.3 yesterday).  Notified MD on-call for advanced heart failure team.

## 2023-10-23 ENCOUNTER — Inpatient Hospital Stay (HOSPITAL_COMMUNITY)

## 2023-10-23 DIAGNOSIS — J9621 Acute and chronic respiratory failure with hypoxia: Secondary | ICD-10-CM | POA: Diagnosis not present

## 2023-10-23 LAB — GLUCOSE, CAPILLARY
Glucose-Capillary: 312 mg/dL — ABNORMAL HIGH (ref 70–99)
Glucose-Capillary: 184 mg/dL — ABNORMAL HIGH (ref 70–99)
Glucose-Capillary: 228 mg/dL — ABNORMAL HIGH (ref 70–99)
Glucose-Capillary: 314 mg/dL — ABNORMAL HIGH (ref 70–99)

## 2023-10-23 LAB — CBC
HCT: 44.6 % (ref 36.0–46.0)
Hemoglobin: 14.4 g/dL (ref 12.0–15.0)
MCH: 28.9 pg (ref 26.0–34.0)
MCHC: 32.3 g/dL (ref 30.0–36.0)
MCV: 89.6 fL (ref 80.0–100.0)
Platelets: 257 K/uL (ref 150–400)
RBC: 4.98 MIL/uL (ref 3.87–5.11)
RDW: 15.5 % (ref 11.5–15.5)
WBC: 6.7 K/uL (ref 4.0–10.5)
nRBC: 0 % (ref 0.0–0.2)

## 2023-10-23 LAB — MAGNESIUM: Magnesium: 2.1 mg/dL (ref 1.7–2.4)

## 2023-10-23 LAB — BASIC METABOLIC PANEL WITH GFR
Anion gap: 9 (ref 5–15)
BUN: 23 mg/dL (ref 8–23)
CO2: 27 mmol/L (ref 22–32)
Calcium: 8.6 mg/dL — ABNORMAL LOW (ref 8.9–10.3)
Chloride: 100 mmol/L (ref 98–111)
Creatinine, Ser: 1.1 mg/dL — ABNORMAL HIGH (ref 0.44–1.00)
GFR, Estimated: 55 mL/min — ABNORMAL LOW (ref >60)
Glucose, Bld: 352 mg/dL — ABNORMAL HIGH (ref 70–99)
Potassium: 3.9 mmol/L (ref 3.5–5.1)
Sodium: 136 mmol/L (ref 135–145)

## 2023-10-23 LAB — LIPOPROTEIN A (LPA): Lipoprotein (a): 75.6 nmol/L — ABNORMAL HIGH (ref <75.0)

## 2023-10-23 MED ORDER — POTASSIUM CHLORIDE CRYS ER 20 MEQ PO TBCR
40.0000 meq | EXTENDED_RELEASE_TABLET | Freq: Once | ORAL | Status: AC
  Administered 2023-10-23: 40 meq via ORAL
  Filled 2023-10-23: qty 2

## 2023-10-23 MED ORDER — FUROSEMIDE 10 MG/ML IJ SOLN
80.0000 mg | Freq: Once | INTRAMUSCULAR | Status: AC
  Administered 2023-10-23: 80 mg via INTRAVENOUS
  Filled 2023-10-23: qty 8

## 2023-10-23 MED ORDER — FREE WATER
250.0000 mL | Freq: Once | Status: AC
  Administered 2023-10-24: 250 mL via ORAL

## 2023-10-23 MED ORDER — INSULIN ASPART 100 UNIT/ML IJ SOLN
3.0000 [IU] | Freq: Three times a day (TID) | INTRAMUSCULAR | Status: DC
  Administered 2023-10-23 – 2023-10-24 (×3): 3 [IU] via SUBCUTANEOUS

## 2023-10-23 MED ORDER — AMLODIPINE BESYLATE 2.5 MG PO TABS
2.5000 mg | ORAL_TABLET | Freq: Every day | ORAL | Status: DC
  Administered 2023-10-23: 2.5 mg via ORAL
  Filled 2023-10-23: qty 1

## 2023-10-23 MED ORDER — INSULIN ASPART 100 UNIT/ML IJ SOLN: 3.0000 [IU] | Freq: Three times a day (TID) | INTRAMUSCULAR | Status: DC

## 2023-10-23 MED ORDER — INSULIN GLARGINE 100 UNIT/ML ~~LOC~~ SOLN
10.0000 [IU] | Freq: Every day | SUBCUTANEOUS | Status: DC
  Administered 2023-10-23: 10 [IU] via SUBCUTANEOUS
  Filled 2023-10-23 (×2): qty 0.1

## 2023-10-23 MED ORDER — IPRATROPIUM BROMIDE 0.02 % IN SOLN
0.5000 mg | Freq: Two times a day (BID) | RESPIRATORY_TRACT | Status: DC
  Administered 2023-10-23 – 2023-10-24 (×2): 0.5 mg via RESPIRATORY_TRACT
  Filled 2023-10-23 (×2): qty 2.5

## 2023-10-23 MED ORDER — LEVALBUTEROL HCL 0.63 MG/3ML IN NEBU
0.6300 mg | INHALATION_SOLUTION | Freq: Two times a day (BID) | RESPIRATORY_TRACT | Status: DC
  Administered 2023-10-23 – 2023-10-24 (×2): 0.63 mg via RESPIRATORY_TRACT
  Filled 2023-10-23 (×2): qty 3

## 2023-10-23 NOTE — Plan of Care (Signed)

## 2023-10-23 NOTE — Progress Notes (Signed)
 Patient ID: LIBBY GOEHRING, female   DOB: 1955-06-10, 68 y.o.   MRN: [id number]  Progress Note  Patient Name: [name] Date of Encounter: 10/23/2023  Primary Cardiologist: Jayson Sierras, MD  Subjective   Good diuresis again yesterday, I/Os net negative 6210.  Creatinine stable 1.1.  SBP 140s-150s.   Inpatient Medications    Scheduled Meds:  amLODipine  2.5 mg Oral Daily   apixaban  5 mg Oral BID   atorvastatin   40 mg Oral QODAY   dapagliflozin  propanediol  10 mg Oral Daily   escitalopram   20 mg Oral Q0600   furosemide   80 mg Intravenous Once   insulin  aspart  0-15 Units Subcutaneous TID WC   insulin  aspart  3 Units Subcutaneous TID WC   insulin  glargine  10 Units Subcutaneous Daily   ipratropium  0.5 mg Nebulization BID   levalbuterol   0.63 mg Nebulization BID   potassium chloride   40 mEq Oral Once   sildenafil   20 mg Oral TID   sodium chloride  flush  3 mL Intravenous Q12H   Continuous Infusions:  PRN Meds: acetaminophen  **OR** acetaminophen , acetaminophen , bisacodyl , hydrALAZINE , HYDROmorphone  (DILAUDID ) injection, nystatin  cream, ondansetron  **OR** ondansetron  (ZOFRAN ) IV, oxyCODONE , senna-docusate, traZODone    Vital Signs    Vitals:   10/23/23 0034 10/23/23 0607 10/23/23 0744 10/23/23 1159  BP: 113/63 (!) 134/56 (!) (P) 173/70 (!) (P) 154/66  Pulse: 67 62 (P) 60 (P) 60  Resp: 16 15 (P) 15 (P) 17  Temp: 98.3 F (36.8 C) 97.8 F (36.6 C) (P) 97.6 F (36.4 C) (P) 97.9 F (36.6 C)  TempSrc: Oral Oral (P) Axillary (P) Axillary  SpO2: 90% 93% (P) 99% (P) 97%  Weight:  88 kg    Height:        Intake/Output Summary (Last 24 hours) at 10/23/2023 1209 Last data filed at 10/22/2023 2000 Gross per 24 hour  Intake 240 ml  Output 1750 ml  Net -1510 ml      10/23/2023    6:07 AM 10/22/2023    6:05 AM 10/21/2023   10:03 AM  Last 3 Weights  Weight (lbs) 194 lb 194 lb 9.6 oz 196 lb 1.6 oz  Weight (kg) 87.998 kg 88.27 kg 88.95 kg     Telemetry    NSR -  Personally Reviewed  Physical Exam   General: NAD Neck: JVP 8-9 cm, no thyromegaly or thyroid  nodule.  Lungs: Distant BS.  CV: Nondisplaced PMI.  Heart regular S1/S2, no S3/S4, no murmur.  No peripheral edema.  Abdomen: Soft, nontender, no hepatosplenomegaly, no distention.  Skin: Intact without lesions or rashes.  Neurologic: Alert and oriented x 3.  Psych: Normal affect. Extremities: No clubbing or cyanosis.  HEENT: Normal.   Labs    High Sensitivity Troponin:   Recent Labs  Lab 09/28/23 1028 09/28/23 1235 10/18/23 1033 10/18/23 1234  TROPONINIHS 7 7 7 7       Cardiac EnzymesNo results for input(s): TROPONINI in the last 168 hours. No results for input(s): TROPIPOC in the last 168 hours.   Chemistry Recent Labs  Lab 10/18/23 1033 10/19/23 0408 10/21/23 0454 10/22/23 0453 10/23/23 0325  NA 140   < > 139 135 136  K 4.0   < > 3.3* 3.2* 3.9  CL 101   < > 96* 93* 100  CO2 25   < > 28 29 27   GLUCOSE 163*   < > 178* 238* 352*  BUN 22   < > 24* 26* 23  CREATININE 0.86   < > 0.88 1.09* 1.10*  CALCIUM  8.8*   < > 8.4* 8.8* 8.6*  PROT 6.9  --   --   --   --   ALBUMIN  3.3*  --   --   --   --   AST 26  --   --   --   --   ALT 25  --   --   --   --   ALKPHOS 85  --   --   --   --   BILITOT 0.5  --   --   --   --   GFRNONAA >60   < > >60 56* 55*  ANIONGAP 14   < > 15 13 9    < > = values in this interval not displayed.     Hematology Recent Labs  Lab 10/21/23 0454 10/22/23 0453 10/23/23 0325  WBC 5.2 6.2 6.7  RBC 4.41 4.72 4.98  HGB 12.9 13.7 14.4  HCT 40.0 42.4 44.6  MCV 90.7 89.8 89.6  MCH 29.3 29.0 28.9  MCHC 32.3 32.3 32.3  RDW 15.6* 15.4 15.5  PLT 209 239 257    BNP Recent Labs  Lab 10/18/23 1033 10/19/23 0408 10/21/23 0454  BNP 373.0* 545.0* 289.0*     DDimer  Recent Labs  Lab 10/18/23 1033  DDIMER 3.12*     Radiology    NM Pulmonary Perfusion Addendum Date: 10/22/2023 ADDENDUM REPORT: 10/22/2023 14:59 ADDENDUM: One moderate size  peripheral perfusion defect and multiple small peripheral perfusion defects. Categorize scan as low to intermediate probability by PIOPED II criteria. Electronically Signed   By: Jackquline Boxer M.D.   On: 10/22/2023 14:59   Result Date: 10/22/2023 CLINICAL DATA:  Pulmonary hypertension. EXAM: NUCLEAR MEDICINE PERFUSION LUNG SCAN TECHNIQUE: Perfusion images were obtained in multiple projections after intravenous injection of radiopharmaceutical. RADIOPHARMACEUTICALS:  4.4 mCi Tc-55m MAA COMPARISON:  Chest radiograph 10/18/2023, CT 10/18/2023 FINDINGS: There are several peripheral perfusion defects. One in the lateral RIGHT lower lobe. Probable defect in the posterior LEFT lower lobe. Defect at the RIGHT lung base additionally. IMPRESSION: Several small peripheral perfusion defects. Negative CTPA. 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 Electronically Signed: By: Jackquline Boxer M.D. On: 10/21/2023 15:01   ECHOCARDIOGRAM COMPLETE BUBBLE STUDY Result Date: 10/21/2023    ECHOCARDIOGRAM REPORT   Patient Name:   ROSEZETTA BALDERSTON Date of Exam: 10/21/2023 Medical Rec #:  [id number]      Height:       64.0 in Accession #:    [id number]     Weight:       196.1 lb Date of Birth:  [birth year]      BSA:          1.940 m Patient Age:    67 years       BP:           110/56 mmHg Patient Gender: F              HR:           59 bpm. Exam Location:  Inpatient Procedure: 2D Echo and Saline Contrast Bubble Study (Both Spectral and Color            Flow Doppler were utilized during procedure). Indications:    CHF Pulm HTN  History:        Patient has prior history of Echocardiogram examinations. CHF,  Pulmonary HTN; Signs/Symptoms:Dyspnea.  Sonographer:    Norleen Amour Referring Phys: [id number] BRITTAINY M SIMMONS IMPRESSIONS  1. Cannot exclude a small PFO. Agitated saline contrast bubble study was positive with shunting observed within 3-6 cardiac cycles suggestive of interatrial shunt.  2. Left ventricular  ejection fraction, by estimation, is 65 to 70%. The left ventricle has normal function. The left ventricle has no regional wall motion abnormalities. Left ventricular diastolic parameters were normal.  3. Right ventricular systolic function is normal. The right ventricular size is normal. There is mildly elevated pulmonary artery systolic pressure. The estimated right ventricular systolic pressure is 38.0 mmHg.  4. The mitral valve is normal in structure. Trivial mitral valve regurgitation. No evidence of mitral stenosis.  5. The aortic valve is calcified. There is moderate calcification of the aortic valve. There is mild thickening of the aortic valve. Aortic valve regurgitation is not visualized. Mild aortic valve stenosis. Aortic valve mean gradient measures 9.3 mmHg. Aortic valve Vmax measures 2.12 m/s.  6. The inferior vena cava is normal in size with greater than 50% respiratory variability, suggesting right atrial pressure of 3 mmHg. FINDINGS  Left Ventricle: Left ventricular ejection fraction, by estimation, is 65 to 70%. The left ventricle has normal function. The left ventricle has no regional wall motion abnormalities. The left ventricular internal cavity size was normal in size. There is  no left ventricular hypertrophy. Left ventricular diastolic parameters were normal. Right Ventricle: The right ventricular size is normal. No increase in right ventricular wall thickness. Right ventricular systolic function is normal. There is mildly elevated pulmonary artery systolic pressure. The tricuspid regurgitant velocity is 2.40  m/s, and with an assumed right atrial pressure of 15 mmHg, the estimated right ventricular systolic pressure is 38.0 mmHg. Left Atrium: Left atrial size was normal in size. Right Atrium: Right atrial size was normal in size. Pericardium: There is no evidence of pericardial effusion. Mitral Valve: The mitral valve is normal in structure. Trivial mitral valve regurgitation. No evidence of  mitral valve stenosis. Tricuspid Valve: The tricuspid valve is normal in structure. Tricuspid valve regurgitation is mild . No evidence of tricuspid stenosis. Aortic Valve: The aortic valve is calcified. There is moderate calcification of the aortic valve. There is mild thickening of the aortic valve. Aortic valve regurgitation is not visualized. Mild aortic stenosis is present. Aortic valve mean gradient measures 9.3 mmHg. Aortic valve peak gradient measures 18.0 mmHg. Aortic valve area, by VTI measures 0.76 cm. Pulmonic Valve: The pulmonic valve was normal in structure. Pulmonic valve regurgitation is trivial. No evidence of pulmonic stenosis. Aorta: The aortic root is normal in size and structure. Venous: The inferior vena cava is normal in size with greater than 50% respiratory variability, suggesting right atrial pressure of 3 mmHg. IAS/Shunts: Cannot exclude a small PFO. Agitated saline contrast was given intravenously to evaluate for intracardiac shunting. Agitated saline contrast bubble study was positive with shunting observed within 3-6 cardiac cycles suggestive of interatrial shunt.  LEFT VENTRICLE PLAX 2D LVIDd:         4.50 cm     Diastology LVIDs:         2.50 cm     LV e' medial:    8.26 cm/s LV PW:         0.70 cm     LV E/e' medial:  13.1 LV IVS:        0.60 cm     LV e' lateral:   8.55 cm/s LVOT diam:  1.40 cm     LV E/e' lateral: 12.6 LV SV:         34 LV SV Index:   18 LVOT Area:     1.54 cm  LV Volumes (MOD) LV vol d, MOD A2C: 63.3 ml LV vol d, MOD A4C: 54.3 ml LV vol s, MOD A2C: 22.1 ml LV vol s, MOD A4C: 13.1 ml LV SV MOD A2C:     41.2 ml LV SV MOD A4C:     54.3 ml LV SV MOD BP:      42.2 ml RIGHT VENTRICLE             IVC RV Basal diam:  3.40 cm     IVC diam: 1.90 cm RV S prime:     12.10 cm/s TAPSE (M-mode): 1.9 cm LEFT ATRIUM             Index        RIGHT ATRIUM           Index LA diam:        3.40 cm 1.75 cm/m   RA Area:     12.00 cm LA Vol (A2C):   31.5 ml 16.24 ml/m  RA Volume:    28.70 ml  14.79 ml/m LA Vol (A4C):   34.0 ml 17.53 ml/m LA Biplane Vol: 35.7 ml 18.40 ml/m  AORTIC VALVE                     PULMONIC VALVE AV Area (Vmax):    0.73 cm      PV Vmax:       0.89 m/s AV Area (Vmean):   0.71 cm      PV Peak grad:  3.2 mmHg AV Area (VTI):     0.76 cm AV Vmax:           212.00 cm/s AV Vmean:          141.333 cm/s AV VTI:            0.452 m AV Peak Grad:      18.0 mmHg AV Mean Grad:      9.3 mmHg LVOT Vmax:         101.00 cm/s LVOT Vmean:        65.000 cm/s LVOT VTI:          0.223 m LVOT/AV VTI ratio: 0.49  AORTA Ao Root diam: 2.30 cm Ao Asc diam:  2.90 cm MITRAL VALVE                TRICUSPID VALVE MV Area (PHT): 3.46 cm     TR Peak grad:   23.0 mmHg MV Decel Time: 219 msec     TR Vmax:        240.00 cm/s MV E velocity: 108.00 cm/s MV A velocity: 59.70 cm/s   SHUNTS MV E/A ratio:  1.81         Systemic VTI:  0.22 m                             Systemic Diam: 1.40 cm Oneil Parchment MD Electronically signed by Oneil Parchment MD Signature Date/Time: 10/21/2023/2:50:19 PM    Final    Patient Profile     68 y/o female w/ h/o chronic asthmatic bronchitis followed by Dr. Darlean, tobacco abuse, OSA on CPAP, Obesity, pulmonary HTN w/ associated RV dysfunction, CAD s/p anterior STEMI w/ DES to LAD  in 2018, PSVT, moderate carotid disease by duplex 2023, recent outpatient evaluation for syncope admitted for acute hypoxic respiratory failure and found to have a/c HFpEF w/ prominent RV dysfunction in the setting of severe PAH.   1. Pulmonary hypertension: Patient has severe pulmonary hypertension with evidence for RV dysfunction by echo.  Echo showed EF 70-75% with D-shaped interventricular septum, mild RV enlargement with mild-moderate RV dysfunction, PASP 77 mmHg, dilated IVC, mild aortic stenosis.   RHC showed elevated right and left heart filling pressures with mean PAP 64 mmHg, PVR 10 WU.  This appears to be combined group 1 and 2 PH, primarily group 1.  CTA chest with no significant lung  parenchymal disease and no acute PE.  Prior pulmonary workup revealed OSA and mild asthma.  Patient is a prior smoker.  She is on CPAP at night.  She is not hypoxemic at rest. I do not think that OSA can explain the extent of her PH.  CI was low, 1.7 by Fick and 1.9 by thermodilution.  This is an ominous findings.  Her episodes of presyncope are also concerning.  V/Q scan concerning for chronic PE, reviewed with pulmonary.  Bubble study suggestive of PFO. HIV, ANA, SCL-70, anti-centromere Ab negative.   - HRCT to rule out ILD, significant parenchymal disease.   - She is on sildenafil  20 mg tid.  Would like to transition to riociguat as outpatient.  With elevated PCWP on RHC, will aim to repeat RHC Monday to document PCWP in normal limits so we can get riociguat. I discussed risks/benefits with patient and she agrees to the procedure.  - She will need close outpatient followup to for aggressive titration of pulmonary vasodilators.  - Will likely need referral to [facility] CTEPH program.  - Hold off on digoxin with periodic mild bradycardia.  2. Acute diastolic CHF: Elevated PCWP in setting of normal EF.  She has diuresed well, mild JVD on exam today.  - Lasix  80 mg IV x 1. Probably to po tomorrow depending on RHC.  - Continue Farxiga  10 mg daily.   3. CAD: H/o anterior MI.  LHC today with patent LAD stent, nonobstructive disease.  - Stopped ASA with apixaban use (see below).  - Continue statin.  4. OSA: Continue CPAP.  5. Active smoker: Prior PFTs not consistent with COPD and emphysema not noted on CTA chest.  Further assessment for pulmonary parenchymal disease with HRCT chest.   Needs to quit smoking.  6. Type 2 diabetes: per primary.  7. Carotid stenosis: Needs carotid dopplers. 8. 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 Reviewed with pulmonary.  She has no h/o clots.   - Sent hypercoagulable workup.  - Started Eliquis.  - Will get lower extremity venous dopplers to look for DVT.  9. HTN: Add  amlodipine 2.5 mg daily.   Ezra Shuck 10/23/2023 12:09 PM

## 2023-10-23 NOTE — Progress Notes (Signed)
 Pt name added to add-on board for RHC tomorrow.   Dr. Rolan wants to hold AM dose of Eliquis and not restart until after cath. Timing of cath TBD. Spoke with pharmacist about best way to approach the scheduling on MAR. They will time the last dose of Eliquis for tonight. They cannot time a future dose not yet knowing time of cath. Will need CHF team to resume Eliquis when appropriate post-cath tomorrow.  I also notified IM to assist with insulin  dosing adjustment if necessary given that she'll be clear liquids until 5am then NPO thereafter.

## 2023-10-23 NOTE — H&P (View-Only) (Signed)
 Pt name added to add-on board for RHC tomorrow.   Dr. Rolan wants to hold AM dose of Eliquis and not restart until after cath. Timing of cath TBD. Spoke with pharmacist about best way to approach the scheduling on MAR. They will time the last dose of Eliquis for tonight. They cannot time a future dose not yet knowing time of cath. Will need CHF team to resume Eliquis when appropriate post-cath tomorrow.  I also notified IM to assist with insulin  dosing adjustment if necessary given that she'll be clear liquids until 5am then NPO thereafter.

## 2023-10-23 NOTE — Progress Notes (Signed)
 PROGRESS NOTE  [name]  FMW:[id number] DOB: Jun 22, 1955 DOA: 10/18/2023 PCP: [name], MD   Brief Narrative: Patient is a 68 year old female with history of pulmonary hypertension associated with right ventricular dysfunction, OSA on CPAP, asthmatic bronchitis, aortic stenosis, coronary artery disease, non-STEMI status post PCI, COPD, hypertension, hyperlipidemia, morbid obesity, diabetes type 2, anxiety who presented after she was referred from pulmonology office for the evaluation of acute on chronic hypoxic respiratory failure, increased cough, congestion, shortness of breath, dizziness.  On presentation, she was hypoxic and was put on 2 L of oxygen  per minute.  COVID/flu/RSV negative.  CTA chest negative for PE, showed dilation of main pulmonary artery reflecting underlying pulmonary hypertension.  Underwent right/left heart catheterization here.  Currently being managed for acute on chronic CHF exacerbation, pulmonary hypertension.  Cardiology following.   Assessment & Plan:  Principal Problem:   Acute on chronic respiratory failure with hypoxia (HCC) Active Problems:   Acute exacerbation of CHF (congestive heart failure) (HCC)   Anxiety   Carotid artery stenosis   Essential hypertension   Depression   GERD (gastroesophageal reflux disease)   CAD (coronary artery disease)   COPD (chronic obstructive pulmonary disease) (HCC)   Mixed hyperlipidemia   Aortic stenosis   Type 2 diabetes mellitus with hyperglycemia (HCC)   DOE (dyspnea on exertion)   Obesity, Class I, BMI 30.0-34.9 (see actual BMI)   Hypomagnesemia   Pulmonary hypertension, unspecified (HCC)   Acute on chronic HFpEF/pulmonary hypertension/chronic PE: Presented with dyspnea with minimal exertion.  Found to be hypoxic on presentation.  CT chest negative for PE but showed dilation of main pulmonary artery consistent with pulmonary hypertension.  Elevated BNP.  2D echo showed hyperdynamic left ventricular function  with EF of 70 to 75%, grade 2 diastolic dysfunction, features suggesting right ventricular pressure and volume overload.  Heart failure team consulted and following.  Underwent right/left heart cath on 9/25 which showed mild nonobstructive coronary disease, decreased right heart function. She was given IV Lasix ,now on hold. Elevated D-dimer .VQ scan was  done to rule out chronic thromboembolic pulmonary hypertension.  It showed several small peripheral perfusion defects consistent with chronic PE.  Started on Eliquis. Also obtained rheumatoid factor, ANA, , centromere antibody, antiscleroderma antibodies, antiphospholipid antibodies to rule out autoimmune cause of elevated pulmonary artery hypertension.  These are mostly negative .Negative HIV.  Plan to repeat RHC hemodynamics after adequate diuresis and addition of vasodilators.  Currently on sildenafil . Also on Farxiga  Plan to obtain lower extremity Doppler to rule out DVT  History of coronary artery disease: No anginal symptoms history of NSTEMI in 2016 treated with LAD stent.  LHC done on 9/25 showed nonobstructive disease and patent LAD  History of OSA: On CPAP  History of asthmatic bronchitis: Follows with Dr. Darlean.  Continue bronchodilators as needed.  Currently not wheezing  Hyperlipidemia: On statin  Hypertension: On Diovan , Imdur , metoprolol  at home.  Anxiety/depression: As needed Xanax .  Also on Lexapro   GERD: Continue PPI  Hypokalemia: Supplemented with potassium  Tobacco use: 30+ years smoking history.  Trying to quit.  Counseled cessation.  Continue nicotine  patch.  Still smokes 5 cigarettes a day  Diabetes type 2: Takes Farxiga  at home. A1c of 7.3.  Blood sugars are up today, he started on insulin .  We may need to add metformin  on discharge  History of bilateral carotid artery disease: Continue aspirin , statin.  Might repeat carotid Doppler .  Can be done as an outpatient.  Already follows  with Dr. Sheree , vascular  surgery        DVT prophylaxis:Lovenox  apixaban (ELIQUIS) tablet 5 mg     Code Status: Full Code  Family Communication: None at bedside  Patient status:Inpatient  Patient is from :Home  Anticipated discharge un:Ynfz  Estimated DC date:1-2 days, after cardiology clearance   Consultants: Cardiology  Procedures: Left/right heart cath  Antimicrobials:  Anti-infectives (From admission, onward)    None       Subjective: Patient seen and examined at bedside today.  Comfortable.  Hemodynamically stable.  Was wearing her CPAP.  Appears overall euvolemic.  No lower extremity edema.  On room air.  Lungs are mostly clear on auscultation  Objective: Vitals:   10/22/23 2053 10/23/23 0034 10/23/23 0607 10/23/23 0744  BP: 137/65 113/63 (!) 134/56 (!) (P) 173/70  Pulse: 76 67 62 (P) 60  Resp: 18 16 15  (P) 15  Temp: 98.3 F (36.8 C) 98.3 F (36.8 C) 97.8 F (36.6 C) (P) 97.6 F (36.4 C)  TempSrc: Oral Oral Oral (P) Axillary  SpO2: 93% 90% 93% (P) 99%  Weight:   88 kg   Height:        Intake/Output Summary (Last 24 hours) at 10/23/2023 1137 Last data filed at 10/22/2023 2000 Gross per 24 hour  Intake 240 ml  Output 3750 ml  Net -3510 ml   Filed Weights   10/21/23 1003 10/22/23 0605 10/23/23 0607  Weight: 89 kg 88.3 kg 88 kg    Examination:  General exam: Overall comfortable, not in distress HEENT: PERRL Respiratory system:  no wheezes or crackles  Cardiovascular system: S1 & S2 heard, RRR.  Gastrointestinal system: Abdomen is nondistended, soft and nontender. Central nervous system: Alert and oriented Extremities: No edema, no clubbing ,no cyanosis Skin: No rashes, no ulcers,no icterus     Data Reviewed: I have personally reviewed following labs and imaging studies  CBC: Recent Labs  Lab 10/18/23 1033 10/20/23 0852 10/20/23 1337 10/20/23 1341 10/20/23 1355 10/21/23 0454 10/22/23 0453 10/23/23 0325  WBC 6.6 6.8  --   --   --  5.2 6.2 6.7   NEUTROABS 4.6  --   --   --   --   --   --   --   HGB 13.0 13.8   < > 13.3 13.3 12.9 13.7 14.4  HCT 41.7 43.4   < > 39.0 39.0 40.0 42.4 44.6  MCV 93.7 91.2  --   --   --  90.7 89.8 89.6  PLT 228 201  --   --   --  209 239 257   < > = values in this interval not displayed.   Basic Metabolic Panel: Recent Labs  Lab 10/18/23 1033 10/18/23 1512 10/19/23 0408 10/20/23 0852 10/20/23 1337 10/20/23 1341 10/20/23 1355 10/21/23 0454 10/22/23 0453 10/23/23 0325  NA 140  --  139 138   < > 141 141 139 135 136  K 4.0  --  4.0 3.6   < > 3.3* 3.3* 3.3* 3.2* 3.9  CL 101  --  96* 98  --   --   --  96* 93* 100  CO2 25  --  28 26  --   --   --  28 29 27   GLUCOSE 163*  --  275* 174*  --   --   --  178* 238* 352*  BUN 22  --  23 23  --   --   --  24* 26*  23  CREATININE 0.86  --  0.82 0.81  --   --   --  0.88 1.09* 1.10*  CALCIUM  8.8*  --  8.6* 9.0  --   --   --  8.4* 8.8* 8.6*  MG 1.3* 1.7  --   --   --   --   --   --  1.8 2.1  PHOS  --  4.2  --   --   --   --   --   --   --   --    < > = values in this interval not displayed.     Recent Results (from the past 240 hours)  Resp panel by RT-PCR (RSV, Flu A&B, Covid) Anterior Nasal Swab     Status: None   Collection Time: 10/18/23 10:14 AM   Specimen: Anterior Nasal Swab  Result Value Ref Range Status   SARS Coronavirus 2 by RT PCR NEGATIVE NEGATIVE Final    Comment: (NOTE) SARS-CoV-2 target nucleic acids are NOT DETECTED.  The SARS-CoV-2 RNA is generally detectable in upper respiratory specimens during the acute phase of infection. The lowest concentration of SARS-CoV-2 viral copies this assay can detect is 138 copies/mL. A negative result does not preclude SARS-Cov-2 infection and should not be used as the sole basis for treatment or other patient management decisions. A negative result may occur with  improper specimen collection/handling, submission of specimen other than nasopharyngeal swab, presence of viral mutation(s) within  the areas targeted by this assay, and inadequate number of viral copies(<138 copies/mL). A negative result must be combined with clinical observations, patient history, and epidemiological information. The expected result is Negative.  Fact Sheet for Patients:  BloggerCourse.com  Fact Sheet for Healthcare Providers:  SeriousBroker.it  This test is no t yet approved or cleared by the [place]  FDA and  has been authorized for detection and/or diagnosis of SARS-CoV-2 by FDA under an Emergency Use Authorization (EUA). This EUA will remain  in effect (meaning this test can be used) for the duration of the COVID-19 declaration under Section 564(b)(1) of the Act, 21 U.S.C.section 360bbb-3(b)(1), unless the authorization is terminated  or revoked sooner.       Influenza A by PCR NEGATIVE NEGATIVE Final   Influenza B by PCR NEGATIVE NEGATIVE Final    Comment: (NOTE) The Xpert Xpress SARS-CoV-2/FLU/RSV plus assay is intended as an aid in the diagnosis of influenza from Nasopharyngeal swab specimens and should not be used as a sole basis for treatment. Nasal washings and aspirates are unacceptable for Xpert Xpress SARS-CoV-2/FLU/RSV testing.  Fact Sheet for Patients: BloggerCourse.com  Fact Sheet for Healthcare Providers: SeriousBroker.it  This test is not yet approved or cleared by the [place]  FDA and has been authorized for detection and/or diagnosis of SARS-CoV-2 by FDA under an Emergency Use Authorization (EUA). This EUA will remain in effect (meaning this test can be used) for the duration of the COVID-19 declaration under Section 564(b)(1) of the Act, 21 U.S.C. section 360bbb-3(b)(1), unless the authorization is terminated or revoked.     Resp Syncytial Virus by PCR NEGATIVE NEGATIVE Final    Comment: (NOTE) Fact Sheet for  Patients: BloggerCourse.com  Fact Sheet for Healthcare Providers: SeriousBroker.it  This test is not yet approved or cleared by the [place]  FDA and has been authorized for detection and/or diagnosis of SARS-CoV-2 by FDA under an Emergency Use Authorization (EUA). This EUA will remain in effect (meaning this test can be used) for the  duration of the COVID-19 declaration under Section 564(b)(1) of the Act, 21 U.S.C. section 360bbb-3(b)(1), unless the authorization is terminated or revoked.  Performed at [facility], [address], [place], [place] [address]      Radiology Studies: NM Pulmonary Perfusion Addendum Date: 10/22/2023 ADDENDUM REPORT: 10/22/2023 14:59 ADDENDUM: One moderate size peripheral perfusion defect and multiple small peripheral perfusion defects. Categorize scan as low to intermediate probability by PIOPED II criteria. Electronically Signed   By: Jackquline Boxer M.D.   On: 10/22/2023 14:59   Result Date: 10/22/2023 CLINICAL DATA:  Pulmonary hypertension. EXAM: NUCLEAR MEDICINE PERFUSION LUNG SCAN TECHNIQUE: Perfusion images were obtained in multiple projections after intravenous injection of radiopharmaceutical. RADIOPHARMACEUTICALS:  4.4 mCi Tc-20m MAA COMPARISON:  Chest radiograph 10/18/2023, CT 10/18/2023 FINDINGS: There are several peripheral perfusion defects. One in the lateral RIGHT lower lobe. Probable defect in the posterior LEFT lower lobe. Defect at the RIGHT lung base additionally. IMPRESSION: Several small peripheral perfusion defects. Negative CTPA. 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 Electronically Signed: By: Jackquline Boxer M.D. On: 10/21/2023 15:01   ECHOCARDIOGRAM COMPLETE BUBBLE STUDY Result Date: 10/21/2023    ECHOCARDIOGRAM REPORT   Patient Name:   [name] Date of Exam: 10/21/2023 Medical Rec #:  [id number]      Height:       64.0 in Accession #:    [id number]     Weight:        196.1 lb Date of Birth:  [birth year]      BSA:          1.940 m Patient Age:    67 years       BP:           110/56 mmHg Patient Gender: F              HR:           59 bpm. Exam Location:  Inpatient Procedure: 2D Echo and Saline Contrast Bubble Study (Both Spectral and Color            Flow Doppler were utilized during procedure). Indications:    CHF Pulm HTN  History:        Patient has prior history of Echocardiogram examinations. CHF,                 Pulmonary HTN; Signs/Symptoms:Dyspnea.  Sonographer:    Norleen Amour Referring Phys: [id number] BRITTAINY M SIMMONS IMPRESSIONS  1. Cannot exclude a small PFO. Agitated saline contrast bubble study was positive with shunting observed within 3-6 cardiac cycles suggestive of interatrial shunt.  2. Left ventricular ejection fraction, by estimation, is 65 to 70%. The left ventricle has normal function. The left ventricle has no regional wall motion abnormalities. Left ventricular diastolic parameters were normal.  3. Right ventricular systolic function is normal. The right ventricular size is normal. There is mildly elevated pulmonary artery systolic pressure. The estimated right ventricular systolic pressure is 38.0 mmHg.  4. The mitral valve is normal in structure. Trivial mitral valve regurgitation. No evidence of mitral stenosis.  5. The aortic valve is calcified. There is moderate calcification of the aortic valve. There is mild thickening of the aortic valve. Aortic valve regurgitation is not visualized. Mild aortic valve stenosis. Aortic valve mean gradient measures 9.3 mmHg. Aortic valve Vmax measures 2.12 m/s.  6. The inferior vena cava is normal in size with greater than 50% respiratory variability, suggesting right atrial pressure of 3 mmHg. FINDINGS  Left Ventricle: Left ventricular ejection  fraction, by estimation, is 65 to 70%. The left ventricle has normal function. The left ventricle has no regional wall motion abnormalities. The left ventricular internal  cavity size was normal in size. There is  no left ventricular hypertrophy. Left ventricular diastolic parameters were normal. Right Ventricle: The right ventricular size is normal. No increase in right ventricular wall thickness. Right ventricular systolic function is normal. There is mildly elevated pulmonary artery systolic pressure. The tricuspid regurgitant velocity is 2.40  m/s, and with an assumed right atrial pressure of 15 mmHg, the estimated right ventricular systolic pressure is 38.0 mmHg. Left Atrium: Left atrial size was normal in size. Right Atrium: Right atrial size was normal in size. Pericardium: There is no evidence of pericardial effusion. Mitral Valve: The mitral valve is normal in structure. Trivial mitral valve regurgitation. No evidence of mitral valve stenosis. Tricuspid Valve: The tricuspid valve is normal in structure. Tricuspid valve regurgitation is mild . No evidence of tricuspid stenosis. Aortic Valve: The aortic valve is calcified. There is moderate calcification of the aortic valve. There is mild thickening of the aortic valve. Aortic valve regurgitation is not visualized. Mild aortic stenosis is present. Aortic valve mean gradient measures 9.3 mmHg. Aortic valve peak gradient measures 18.0 mmHg. Aortic valve area, by VTI measures 0.76 cm. Pulmonic Valve: The pulmonic valve was normal in structure. Pulmonic valve regurgitation is trivial. No evidence of pulmonic stenosis. Aorta: The aortic root is normal in size and structure. Venous: The inferior vena cava is normal in size with greater than 50% respiratory variability, suggesting right atrial pressure of 3 mmHg. IAS/Shunts: Cannot exclude a small PFO. Agitated saline contrast was given intravenously to evaluate for intracardiac shunting. Agitated saline contrast bubble study was positive with shunting observed within 3-6 cardiac cycles suggestive of interatrial shunt.  LEFT VENTRICLE PLAX 2D LVIDd:         4.50 cm     Diastology  LVIDs:         2.50 cm     LV e' medial:    8.26 cm/s LV PW:         0.70 cm     LV E/e' medial:  13.1 LV IVS:        0.60 cm     LV e' lateral:   8.55 cm/s LVOT diam:     1.40 cm     LV E/e' lateral: 12.6 LV SV:         34 LV SV Index:   18 LVOT Area:     1.54 cm  LV Volumes (MOD) LV vol d, MOD A2C: 63.3 ml LV vol d, MOD A4C: 54.3 ml LV vol s, MOD A2C: 22.1 ml LV vol s, MOD A4C: 13.1 ml LV SV MOD A2C:     41.2 ml LV SV MOD A4C:     54.3 ml LV SV MOD BP:      42.2 ml RIGHT VENTRICLE             IVC RV Basal diam:  3.40 cm     IVC diam: 1.90 cm RV S prime:     12.10 cm/s TAPSE (M-mode): 1.9 cm LEFT ATRIUM             Index        RIGHT ATRIUM           Index LA diam:        3.40 cm 1.75 cm/m   RA Area:     12.00  cm LA Vol (A2C):   31.5 ml 16.24 ml/m  RA Volume:   28.70 ml  14.79 ml/m LA Vol (A4C):   34.0 ml 17.53 ml/m LA Biplane Vol: 35.7 ml 18.40 ml/m  AORTIC VALVE                     PULMONIC VALVE AV Area (Vmax):    0.73 cm      PV Vmax:       0.89 m/s AV Area (Vmean):   0.71 cm      PV Peak grad:  3.2 mmHg AV Area (VTI):     0.76 cm AV Vmax:           212.00 cm/s AV Vmean:          141.333 cm/s AV VTI:            0.452 m AV Peak Grad:      18.0 mmHg AV Mean Grad:      9.3 mmHg LVOT Vmax:         101.00 cm/s LVOT Vmean:        65.000 cm/s LVOT VTI:          0.223 m LVOT/AV VTI ratio: 0.49  AORTA Ao Root diam: 2.30 cm Ao Asc diam:  2.90 cm MITRAL VALVE                TRICUSPID VALVE MV Area (PHT): 3.46 cm     TR Peak grad:   23.0 mmHg MV Decel Time: 219 msec     TR Vmax:        240.00 cm/s MV E velocity: 108.00 cm/s MV A velocity: 59.70 cm/s   SHUNTS MV E/A ratio:  1.81         Systemic VTI:  0.22 m                             Systemic Diam: 1.40 cm Oneil Parchment MD Electronically signed by Oneil Parchment MD Signature Date/Time: 10/21/2023/2:50:19 PM    Final     Scheduled Meds:  apixaban  5 mg Oral BID   atorvastatin   40 mg Oral QODAY   dapagliflozin  propanediol  10 mg Oral Daily   escitalopram   20 mg  Oral Q0600   insulin  aspart  0-15 Units Subcutaneous TID WC   insulin  aspart  3 Units Subcutaneous TID WC   insulin  glargine  10 Units Subcutaneous Daily   ipratropium  0.5 mg Nebulization BID   levalbuterol   0.63 mg Nebulization BID   sildenafil   20 mg Oral TID   sodium chloride  flush  3 mL Intravenous Q12H   Continuous Infusions:   LOS: 5 days   Ivonne Mustache, MD [facility] P9/28/2025, 11:37 AM

## 2023-10-23 NOTE — Progress Notes (Signed)
 Patient ambulated full length of hall and back to room on room air.  Lowest oxygen  saturation during ambulation noted at 86% with maximum heart rate 110.  Patient denied any complaints during ambulation.  Maintained sats 88-89% for most of ambulation duration.  Patient returned to room, sitting on bedside with call bell in reach.

## 2023-10-24 ENCOUNTER — Telehealth (HOSPITAL_COMMUNITY): Payer: Self-pay | Admitting: Pharmacist

## 2023-10-24 ENCOUNTER — Telehealth (HOSPITAL_COMMUNITY): Payer: Self-pay

## 2023-10-24 ENCOUNTER — Other Ambulatory Visit (HOSPITAL_COMMUNITY): Payer: Self-pay

## 2023-10-24 ENCOUNTER — Encounter (HOSPITAL_COMMUNITY)
Admission: EM | Disposition: A | Discharge status: Home / Self Care | Payer: Self-pay | Source: Ambulatory Visit | Attending: Internal Medicine

## 2023-10-24 ENCOUNTER — Inpatient Hospital Stay (HOSPITAL_COMMUNITY)

## 2023-10-24 ENCOUNTER — Encounter (HOSPITAL_COMMUNITY): Payer: Self-pay | Admitting: Cardiology

## 2023-10-24 ENCOUNTER — Telehealth (HOSPITAL_COMMUNITY): Payer: Self-pay | Admitting: Pharmacy Technician

## 2023-10-24 DIAGNOSIS — J9621 Acute and chronic respiratory failure with hypoxia: Secondary | ICD-10-CM

## 2023-10-24 DIAGNOSIS — I272 Pulmonary hypertension, unspecified: Secondary | ICD-10-CM | POA: Diagnosis not present

## 2023-10-24 HISTORY — PX: RIGHT HEART CATH: CATH118263

## 2023-10-24 LAB — POCT I-STAT EG7
Acid-Base Excess: 1 mmol/L (ref 0.0–2.0)
Acid-Base Excess: 1 mmol/L (ref 0.0–2.0)
Acid-Base Excess: 1 mmol/L (ref 0.0–2.0)
Bicarbonate: 26.4 mmol/L (ref 20.0–28.0)
Bicarbonate: 26.4 mmol/L (ref 20.0–28.0)
Bicarbonate: 27.3 mmol/L (ref 20.0–28.0)
Calcium, Ion: 1.06 mmol/L — ABNORMAL LOW (ref 1.15–1.40)
Calcium, Ion: 1.07 mmol/L — ABNORMAL LOW (ref 1.15–1.40)
Calcium, Ion: 1.12 mmol/L — ABNORMAL LOW (ref 1.15–1.40)
HCT: 47 % — ABNORMAL HIGH (ref 36.0–46.0)
HCT: 47 % — ABNORMAL HIGH (ref 36.0–46.0)
HCT: 48 % — ABNORMAL HIGH (ref 36.0–46.0)
Hemoglobin: 16 g/dL — ABNORMAL HIGH (ref 12.0–15.0)
Hemoglobin: 16 g/dL — ABNORMAL HIGH (ref 12.0–15.0)
Hemoglobin: 16.3 g/dL — ABNORMAL HIGH (ref 12.0–15.0)
O2 Saturation: 70 %
O2 Saturation: 70 %
O2 Saturation: 76 %
Potassium: 3.9 mmol/L (ref 3.5–5.1)
Potassium: 3.9 mmol/L (ref 3.5–5.1)
Potassium: 4.1 mmol/L (ref 3.5–5.1)
Sodium: 140 mmol/L (ref 135–145)
Sodium: 141 mmol/L (ref 135–145)
Sodium: 141 mmol/L (ref 135–145)
TCO2: 28 mmol/L (ref 22–32)
TCO2: 28 mmol/L (ref 22–32)
TCO2: 29 mmol/L (ref 22–32)
pCO2, Ven: 44.1 mmHg (ref 44–60)
pCO2, Ven: 44.9 mmHg (ref 44–60)
pCO2, Ven: 45.8 mmHg (ref 44–60)
pH, Ven: 7.378 (ref 7.25–7.43)
pH, Ven: 7.383 (ref 7.25–7.43)
pH, Ven: 7.386 (ref 7.25–7.43)
pO2, Ven: 38 mmHg (ref 32–45)
pO2, Ven: 38 mmHg (ref 32–45)
pO2, Ven: 41 mmHg (ref 32–45)

## 2023-10-24 LAB — MAGNESIUM: Magnesium: 2 mg/dL (ref 1.7–2.4)

## 2023-10-24 LAB — BASIC METABOLIC PANEL WITH GFR
Anion gap: 12 (ref 5–15)
BUN: 25 mg/dL — ABNORMAL HIGH (ref 8–23)
CO2: 25 mmol/L (ref 22–32)
Calcium: 8.8 mg/dL — ABNORMAL LOW (ref 8.9–10.3)
Chloride: 101 mmol/L (ref 98–111)
Creatinine, Ser: 0.91 mg/dL (ref 0.44–1.00)
GFR, Estimated: >60 mL/min (ref >60)
Glucose, Bld: 210 mg/dL — ABNORMAL HIGH (ref 70–99)
Potassium: 4 mmol/L (ref 3.5–5.1)
Sodium: 138 mmol/L (ref 135–145)

## 2023-10-24 LAB — RHEUMATOID FACTOR: Rheumatoid fact SerPl-aCnc: <10 [IU]/mL (ref <14.0)

## 2023-10-24 LAB — GLUCOSE, CAPILLARY
Glucose-Capillary: 172 mg/dL — ABNORMAL HIGH (ref 70–99)
Glucose-Capillary: 174 mg/dL — ABNORMAL HIGH (ref 70–99)

## 2023-10-24 SURGERY — RIGHT HEART CATH
Anesthesia: LOCAL

## 2023-10-24 MED ORDER — APIXABAN 5 MG PO TABS
5.0000 mg | ORAL_TABLET | Freq: Two times a day (BID) | ORAL | 1 refills | Status: DC
  Filled 2023-10-24: qty 60, 30d supply, fill #0

## 2023-10-24 MED ORDER — LIDOCAINE HCL (PF) 1 % IJ SOLN
INTRAMUSCULAR | Status: DC | PRN
  Administered 2023-10-24: 2 mL

## 2023-10-24 MED ORDER — TORSEMIDE 20 MG PO TABS
20.0000 mg | ORAL_TABLET | Freq: Every day | ORAL | 0 refills | Status: DC
  Filled 2023-10-24: qty 30, 30d supply, fill #0

## 2023-10-24 MED ORDER — HEPARIN (PORCINE) IN NACL 1000-0.9 UT/500ML-% IV SOLN
INTRAVENOUS | Status: DC | PRN
  Administered 2023-10-24: 500 mL

## 2023-10-24 MED ORDER — TORSEMIDE 20 MG PO TABS: 40.0000 mg | ORAL_TABLET | Freq: Every day | ORAL | Status: DC

## 2023-10-24 MED ORDER — LIDOCAINE HCL (PF) 1 % IJ SOLN
INTRAMUSCULAR | Status: AC
  Filled 2023-10-24: qty 30

## 2023-10-24 MED ORDER — AMLODIPINE BESYLATE 5 MG PO TABS
5.0000 mg | ORAL_TABLET | Freq: Every day | ORAL | 0 refills | Status: DC
  Filled 2023-10-24: qty 30, 30d supply, fill #0

## 2023-10-24 MED ORDER — AMLODIPINE BESYLATE 5 MG PO TABS
5.0000 mg | ORAL_TABLET | Freq: Every day | ORAL | Status: DC
  Administered 2023-10-24: 5 mg via ORAL
  Filled 2023-10-24: qty 1

## 2023-10-24 MED ORDER — INSULIN GLARGINE 100 UNIT/ML ~~LOC~~ SOLN
20.0000 [IU] | Freq: Every day | SUBCUTANEOUS | Status: DC
  Administered 2023-10-24: 20 [IU] via SUBCUTANEOUS
  Filled 2023-10-24: qty 0.2

## 2023-10-24 MED ORDER — TORSEMIDE 20 MG PO TABS: 20.0000 mg | ORAL_TABLET | Freq: Every day | ORAL | Status: DC

## 2023-10-24 MED ORDER — SILDENAFIL CITRATE 20 MG PO TABS
20.0000 mg | ORAL_TABLET | Freq: Three times a day (TID) | ORAL | 0 refills | Status: DC
  Filled 2023-10-24: qty 90, 30d supply, fill #0

## 2023-10-24 SURGICAL SUPPLY — 6 items
CATH BALLN WEDGE 5F 110CM (CATHETERS) IMPLANT
GUIDEWIRE .025 260CM (WIRE) IMPLANT
PACK CARDIAC CATHETERIZATION (CUSTOM PROCEDURE TRAY) ×1 IMPLANT
SHEATH GLIDE SLENDER 4/5FR (SHEATH) IMPLANT
TRANSDUCER W/STOPCOCK (MISCELLANEOUS) IMPLANT
TUBING ART PRESS 72 MALE/FEM (TUBING) IMPLANT

## 2023-10-24 NOTE — Progress Notes (Signed)
 BLE venous duplex has been completed.   Results can be found under chart review under CV PROC. 10/24/2023 1:04 PM Jatara Huettner RVT, RDMS

## 2023-10-24 NOTE — Progress Notes (Addendum)
 Patient ID: [name], female   DOB: [birth year], 68 y.o.   MRN: [id number]  Progress Note  Patient Name: [name] Date of Encounter: 10/24/2023  Primary Cardiologist: Jayson Sierras, MD  Subjective   I/Os net negative again.  Creatinine stable at 0.91.   Breathing improved.   Repeat RHC today: RHC Procedural Findings: Hemodynamics (mmHg) RA mean 6 RV 87/10 PA 86/28, mean 51 PCWP mean 13 Oxygen  saturations: SVC 76% PA 70% AO 95% Cardiac Output (Fick) 5.25  Cardiac Index (Fick) 2.72 PVR 7.2 WU   Inpatient Medications    Scheduled Meds:  [MAR Hold] amLODipine  2.5 mg Oral Daily   [MAR Hold] atorvastatin   40 mg Oral QODAY   [MAR Hold] dapagliflozin  propanediol  10 mg Oral Daily   [MAR Hold] escitalopram   20 mg Oral Q0600   [MAR Hold] insulin  aspart  0-15 Units Subcutaneous TID WC   [MAR Hold] insulin  aspart  3 Units Subcutaneous TID WC   [MAR Hold] insulin  glargine  20 Units Subcutaneous Daily   [MAR Hold] ipratropium  0.5 mg Nebulization BID   [MAR Hold] levalbuterol   0.63 mg Nebulization BID   [MAR Hold] sildenafil   20 mg Oral TID   [MAR Hold] sodium chloride  flush  3 mL Intravenous Q12H   [START ON 10/25/2023] torsemide  40 mg Oral Daily   Continuous Infusions:  PRN Meds: [MAR Hold] acetaminophen  **OR** [MAR Hold] acetaminophen , [MAR Hold] acetaminophen , [MAR Hold] bisacodyl , Heparin  (Porcine) in NaCl, [MAR Hold] hydrALAZINE , [MAR Hold]  HYDROmorphone  (DILAUDID ) injection, lidocaine  (PF), [MAR Hold] nystatin  cream, [MAR Hold] ondansetron  **OR** [MAR Hold] ondansetron  (ZOFRAN ) IV, [MAR Hold] oxyCODONE , [MAR Hold] senna-docusate, [MAR Hold] traZODone    Vital Signs    Vitals:   10/24/23 0739 10/24/23 0830 10/24/23 0953 10/24/23 1008  BP: (!) 150/70     Pulse:      Resp: 18     Temp: 98.3 F (36.8 C)     TempSrc: Oral     SpO2: 98% 100% 92% 95%  Weight:      Height:        Intake/Output Summary (Last 24 hours) at 10/24/2023 1027 Last data filed at  10/24/2023 0516 Gross per 24 hour  Intake 490 ml  Output 3550 ml  Net -3060 ml      10/24/2023    5:22 AM 10/23/2023    6:07 AM 10/22/2023    6:05 AM  Last 3 Weights  Weight (lbs) 189 lb 8 oz 194 lb 194 lb 9.6 oz  Weight (kg) 85.957 kg 87.998 kg 88.27 kg     Telemetry    NSR - Personally Reviewed  Physical Exam   General: NAD Neck: No JVD, no thyromegaly or thyroid  nodule.  Lungs: Distant BS CV: Nondisplaced PMI.  Heart regular S1/S2, no S3/S4, no murmur.  No peripheral edema.  Abdomen: Soft, nontender, no hepatosplenomegaly, no distention.  Skin: Intact without lesions or rashes.  Neurologic: Alert and oriented x 3.  Psych: Normal affect. Extremities: No clubbing or cyanosis.  HEENT: Normal.   Labs    High Sensitivity Troponin:   Recent Labs  Lab 09/28/23 1028 09/28/23 1235 10/18/23 1033 10/18/23 1234  TROPONINIHS 7 7 7 7       Cardiac EnzymesNo results for input(s): TROPONINI in the last 168 hours. No results for input(s): TROPIPOC in the last 168 hours.   Chemistry Recent Labs  Lab 10/18/23 1033 10/19/23 0408 10/22/23 0453 10/23/23 0325 10/24/23 0459  NA 140   < >  135 136 138  K 4.0   < > 3.2* 3.9 4.0  CL 101   < > 93* 100 101  CO2 25   < > 29 27 25   GLUCOSE 163*   < > 238* 352* 210*  BUN 22   < > 26* 23 25*  CREATININE 0.86   < > 1.09* 1.10* 0.91  CALCIUM  8.8*   < > 8.8* 8.6* 8.8*  PROT 6.9  --   --   --   --   ALBUMIN  3.3*  --   --   --   --   AST 26  --   --   --   --   ALT 25  --   --   --   --   ALKPHOS 85  --   --   --   --   BILITOT 0.5  --   --   --   --   GFRNONAA >60   < > 56* 55* >60  ANIONGAP 14   < > 13 9 12    < > = values in this interval not displayed.     Hematology Recent Labs  Lab 10/21/23 0454 10/22/23 0453 10/23/23 0325  WBC 5.2 6.2 6.7  RBC 4.41 4.72 4.98  HGB 12.9 13.7 14.4  HCT 40.0 42.4 44.6  MCV 90.7 89.8 89.6  MCH 29.3 29.0 28.9  MCHC 32.3 32.3 32.3  RDW 15.6* 15.4 15.5  PLT 209 239 257     BNP Recent Labs  Lab 10/18/23 1033 10/19/23 0408 10/21/23 0454  BNP 373.0* 545.0* 289.0*     DDimer  Recent Labs  Lab 10/18/23 1033  DDIMER 3.12*     Radiology    No results found.  Patient Profile     68 y/o female w/ h/o chronic asthmatic bronchitis followed by Dr. Darlean, tobacco abuse, OSA on CPAP, Obesity, pulmonary HTN w/ associated RV dysfunction, CAD s/p anterior STEMI w/ DES to LAD in 2018, PSVT, moderate carotid disease by duplex 2023, recent outpatient evaluation for syncope admitted for acute hypoxic respiratory failure and found to have a/c HFpEF w/ prominent RV dysfunction in the setting of severe PAH.   1. Pulmonary hypertension: Patient has severe pulmonary hypertension with evidence for RV dysfunction by echo.  Echo showed EF 70-75% with D-shaped interventricular septum, mild RV enlargement with mild-moderate RV dysfunction, PASP 77 mmHg, dilated IVC, mild aortic stenosis.   RHC showed elevated right and left heart filling pressures with mean PAP 64 mmHg, PVR 10 WU.  This appears to be combined group 1 and 2 PH, primarily group 1.  CTA chest with no significant lung parenchymal disease and no acute PE.  Prior pulmonary workup revealed OSA and mild asthma.  Patient is a prior smoker.  She is on CPAP at night.  She is not hypoxemic at rest. I do not think that OSA can explain the extent of her PH.  CI was low on initial RHC, 1.7 by Fick and 1.9 by thermodilution.  This is an ominous findings.  Her episodes of presyncope are also concerning.  V/Q scan concerning for chronic PE, reviewed with pulmonary.  Bubble study suggestive of PFO. HIV, ANA, SCL-70, anti-centromere Ab negative.  Repeat RHC after diuresis showed normally filling pressures, severe PAH, and CI improved 2.72.  No step-up in oxygen  saturation from SVC to PA (no significant shunt lesion).   - HRCT to rule out ILD, significant parenchymal disease => done but read is pending.   -  She is on sildenafil  20 mg tid.   Would like to transition to riociguat as outpatient.  PCWP normal on RHC today so should be able to get riociguat as outpatient.  - She will need close outpatient followup in HF clinic. .  - Will likely need referral to [facility] CTEPH program.  2. Acute diastolic CHF: Elevated PCWP in setting of normal EF on initial RHC.  She has diuresed well, repeat RHC shows normal filling pressures.  - Start torsemide 20 mg daily tomorrow.  - Continue Farxiga  10 mg daily.   3. CAD: H/o anterior MI.  LHC today with patent LAD stent, nonobstructive disease.  - Stopped ASA with apixaban use (see below).  - Continue statin.  4. OSA: Continue CPAP.  5. Active smoker: Prior PFTs not consistent with COPD and emphysema not noted on CTA chest.  Further assessment for pulmonary parenchymal disease with HRCT chest.   Needs to quit smoking.  6. Type 2 diabetes: per primary.  7. Carotid stenosis: Needs carotid dopplers. 8. 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 Reviewed with pulmonary.  She has no h/o clots.   - Sent hypercoagulable workup.  - Started Eliquis.  - Will get lower extremity venous dopplers to look for DVT => hopefully today.  9. HTN: Increase amlodipine to 5 mg daily.   I think she can go home today.  She will need close followup in CHF clinic, we will work on getting approval for riociguat (will replace sildenafil ).  Please get venous dopplers of legs before discharge.  Meds for home: Torsemide 20 mg daily, amlodipine 5 mg daily, Farxiga  10 mg daily, sildenafil  20 tid, apixaban 5 bid, atorvastatin  40 daily.   Ezra Shuck 10/24/2023 10:27 AM

## 2023-10-24 NOTE — Telephone Encounter (Signed)
 Advanced Heart Failure Patient Advocate Encounter  New start and REMS forms faxed to [facility] on 10/24/2023.  Rachel DEL, CPhT Rx Patient Advocate Phone: [phone]

## 2023-10-24 NOTE — TOC Transition Note (Signed)
 Transition of Care [facility]) - Discharge Note   Patient Details  Name: [name] MRN: [id number] Date of Birth: June 26, 1955  Transition of Care [facility]) CM/SW Contact:  Sudie Erminio Deems, RN Phone Number: 10/24/2023, 3:25 PM   Clinical Narrative: Patient plans to transition home today. Inpatient Case Manager reviewed the patient and the plan is to return home without any [facility] Services. Patient has family at the bedside that will assist with transportation home.      Barriers to Discharge: No Barriers Identified   Patient Goals and CMS Choice Patient states their goals for this hospitalization and ongoing recovery are:: Plan for home once stable.   Choice offered to / list presented to : NA      Discharge Placement                       Discharge Plan and Services Additional resources added to the After Visit Summary for   In-house Referral: NA Discharge Planning Services: CM Consult Post Acute Care Choice: NA            DME Agency: NA                  Social Drivers of Health (SDOH) Interventions SDOH Screenings   Food Insecurity: No Food Insecurity (08/06/2023)  Housing: Low Risk  (08/06/2023)  Transportation Needs: No Transportation Needs (08/06/2023)  Utilities: Not At Risk (03/13/2023)  Depression (PHQ2-9): Low Risk  (08/10/2023)  Financial Resource Strain: Low Risk  (08/06/2023)  Physical Activity: Insufficiently Active (08/06/2023)  Social Connections: Moderately Integrated (08/06/2023)  Stress: No Stress Concern Present (08/06/2023)  Tobacco Use: High Risk (10/21/2023)     Readmission Risk Interventions    10/19/2023    1:14 PM  Readmission Risk Prevention Plan  Medication Screening Complete  Transportation Screening Complete

## 2023-10-24 NOTE — Telephone Encounter (Signed)
 Advanced Heart Failure Patient Advocate Encounter  Prior Authorization for [employer] has been approved (CMM key [id number]).    Effective dates: 10/24/23 through 10/23/24  Once enrollment and REMS forms are signed, enrollment application will be faxed to [facility] hub. Will then be further triaged to Specialty Pharmacy preferred by her insurance.   Once she receives [facility], will need to hold sildenafil  for at least 24 hours before starting [facility] therapy.   Tinnie Redman, PharmD, BCPS, BCCP, CPP Heart Failure Clinic Pharmacist [phone]

## 2023-10-24 NOTE — Telephone Encounter (Signed)
 Pharmacy Patient Advocate Encounter  Received notification from [facility] that Prior Authorization for Sildenafil  [facility]) 20MG  tablets  has been APPROVED from 10/24/2023 to 10/23/2024   PA #/Case ID/Reference #: [id number]

## 2023-10-24 NOTE — Plan of Care (Signed)
 Problem: Education: Goal: Knowledge of General Education information will improve Description: Including pain rating scale, medication(s)/side effects and non-pharmacologic comfort measures 10/24/2023 1554 by Emil Roselie SAUNDERS, RN Outcome: Adequate for Discharge 10/24/2023 1554 by Emil Roselie SAUNDERS, RN Outcome: Adequate for Discharge   Problem: Health Behavior/Discharge Planning: Goal: Ability to manage health-related needs will improve 10/24/2023 1554 by Emil Roselie SAUNDERS, RN Outcome: Adequate for Discharge 10/24/2023 1554 by Emil Roselie SAUNDERS, RN Outcome: Adequate for Discharge   Problem: Clinical Measurements: Goal: Ability to maintain clinical measurements within normal limits will improve 10/24/2023 1554 by Emil Roselie SAUNDERS, RN Outcome: Adequate for Discharge 10/24/2023 1554 by Emil Roselie SAUNDERS, RN Outcome: Adequate for Discharge Goal: Will remain free from infection 10/24/2023 1554 by Emil Roselie SAUNDERS, RN Outcome: Adequate for Discharge 10/24/2023 1554 by Emil Roselie SAUNDERS, RN Outcome: Adequate for Discharge Goal: Diagnostic test results will improve 10/24/2023 1554 by Emil Roselie SAUNDERS, RN Outcome: Adequate for Discharge 10/24/2023 1554 by Emil Roselie SAUNDERS, RN Outcome: Adequate for Discharge Goal: Respiratory complications will improve 10/24/2023 1554 by Emil Roselie SAUNDERS, RN Outcome: Adequate for Discharge 10/24/2023 1554 by Emil Roselie SAUNDERS, RN Outcome: Adequate for Discharge Goal: Cardiovascular complication will be avoided 10/24/2023 1554 by Emil Roselie SAUNDERS, RN Outcome: Adequate for Discharge 10/24/2023 1554 by Emil Roselie SAUNDERS, RN Outcome: Adequate for Discharge   Problem: Activity: Goal: Risk for activity intolerance will decrease 10/24/2023 1554 by Emil Roselie SAUNDERS, RN Outcome: Adequate for Discharge 10/24/2023 1554 by Emil Roselie SAUNDERS, RN Outcome: Adequate for Discharge   Problem: Nutrition: Goal: Adequate nutrition will be  maintained 10/24/2023 1554 by Emil Roselie SAUNDERS, RN Outcome: Adequate for Discharge 10/24/2023 1554 by Emil Roselie SAUNDERS, RN Outcome: Adequate for Discharge   Problem: Coping: Goal: Level of anxiety will decrease 10/24/2023 1554 by Emil Roselie SAUNDERS, RN Outcome: Adequate for Discharge 10/24/2023 1554 by Emil Roselie SAUNDERS, RN Outcome: Adequate for Discharge   Problem: Elimination: Goal: Will not experience complications related to bowel motility 10/24/2023 1554 by Emil Roselie SAUNDERS, RN Outcome: Adequate for Discharge 10/24/2023 1554 by Emil Roselie SAUNDERS, RN Outcome: Adequate for Discharge Goal: Will not experience complications related to urinary retention 10/24/2023 1554 by Emil Roselie SAUNDERS, RN Outcome: Adequate for Discharge 10/24/2023 1554 by Emil Roselie SAUNDERS, RN Outcome: Adequate for Discharge   Problem: Pain Managment: Goal: General experience of comfort will improve and/or be controlled 10/24/2023 1554 by Emil Roselie SAUNDERS, RN Outcome: Adequate for Discharge 10/24/2023 1554 by Emil Roselie SAUNDERS, RN Outcome: Adequate for Discharge   Problem: Safety: Goal: Ability to remain free from injury will improve 10/24/2023 1554 by Emil Roselie SAUNDERS, RN Outcome: Adequate for Discharge 10/24/2023 1554 by Emil Roselie SAUNDERS, RN Outcome: Adequate for Discharge   Problem: Skin Integrity: Goal: Risk for impaired skin integrity will decrease 10/24/2023 1554 by Emil Roselie SAUNDERS, RN Outcome: Adequate for Discharge 10/24/2023 1554 by Emil Roselie SAUNDERS, RN Outcome: Adequate for Discharge   Problem: Education: Goal: Ability to describe self-care measures that may prevent or decrease complications (Diabetes Survival Skills Education) will improve 10/24/2023 1554 by Emil Roselie SAUNDERS, RN Outcome: Adequate for Discharge 10/24/2023 1554 by Emil Roselie SAUNDERS, RN Outcome: Adequate for Discharge Goal: Individualized Educational Video(s) 10/24/2023 1554 by Emil Roselie SAUNDERS, RN Outcome:  Adequate for Discharge 10/24/2023 1554 by Emil Roselie SAUNDERS, RN Outcome: Adequate for Discharge   Problem: Coping: Goal: Ability to adjust to condition or change in health will improve 10/24/2023 1554 by Emil Roselie SAUNDERS, RN Outcome: Adequate for Discharge 10/24/2023 1554 by  Emil Roselie SAUNDERS, RN Outcome: Adequate for Discharge   Problem: Fluid Volume: Goal: Ability to maintain a balanced intake and output will improve 10/24/2023 1554 by Emil Roselie SAUNDERS, RN Outcome: Adequate for Discharge 10/24/2023 1554 by Emil Roselie SAUNDERS, RN Outcome: Adequate for Discharge   Problem: Health Behavior/Discharge Planning: Goal: Ability to identify and utilize available resources and services will improve 10/24/2023 1554 by Emil Roselie SAUNDERS, RN Outcome: Adequate for Discharge 10/24/2023 1554 by Emil Roselie SAUNDERS, RN Outcome: Adequate for Discharge Goal: Ability to manage health-related needs will improve 10/24/2023 1554 by Emil Roselie SAUNDERS, RN Outcome: Adequate for Discharge 10/24/2023 1554 by Emil Roselie SAUNDERS, RN Outcome: Adequate for Discharge   Problem: Metabolic: Goal: Ability to maintain appropriate glucose levels will improve 10/24/2023 1554 by Emil Roselie SAUNDERS, RN Outcome: Adequate for Discharge 10/24/2023 1554 by Emil Roselie SAUNDERS, RN Outcome: Adequate for Discharge   Problem: Nutritional: Goal: Maintenance of adequate nutrition will improve 10/24/2023 1554 by Emil Roselie SAUNDERS, RN Outcome: Adequate for Discharge 10/24/2023 1554 by Emil Roselie SAUNDERS, RN Outcome: Adequate for Discharge Goal: Progress toward achieving an optimal weight will improve 10/24/2023 1554 by Emil Roselie SAUNDERS, RN Outcome: Adequate for Discharge 10/24/2023 1554 by Emil Roselie SAUNDERS, RN Outcome: Adequate for Discharge   Problem: Skin Integrity: Goal: Risk for impaired skin integrity will decrease 10/24/2023 1554 by Emil Roselie SAUNDERS, RN Outcome: Adequate for Discharge 10/24/2023 1554 by Emil Roselie SAUNDERS, RN Outcome: Adequate for Discharge   Problem: Tissue Perfusion: Goal: Adequacy of tissue perfusion will improve 10/24/2023 1554 by Emil Roselie SAUNDERS, RN Outcome: Adequate for Discharge 10/24/2023 1554 by Emil Roselie SAUNDERS, RN Outcome: Adequate for Discharge   Problem: Education: Goal: Understanding of CV disease, CV risk reduction, and recovery process will improve 10/24/2023 1554 by Emil Roselie SAUNDERS, RN Outcome: Adequate for Discharge 10/24/2023 1554 by Emil Roselie SAUNDERS, RN Outcome: Adequate for Discharge Goal: Individualized Educational Video(s) 10/24/2023 1554 by Emil Roselie SAUNDERS, RN Outcome: Adequate for Discharge 10/24/2023 1554 by Emil Roselie SAUNDERS, RN Outcome: Adequate for Discharge   Problem: Activity: Goal: Ability to return to baseline activity level will improve 10/24/2023 1554 by Emil Roselie SAUNDERS, RN Outcome: Adequate for Discharge 10/24/2023 1554 by Emil Roselie SAUNDERS, RN Outcome: Adequate for Discharge   Problem: Cardiovascular: Goal: Ability to achieve and maintain adequate cardiovascular perfusion will improve 10/24/2023 1554 by Emil Roselie SAUNDERS, RN Outcome: Adequate for Discharge 10/24/2023 1554 by Emil Roselie SAUNDERS, RN Outcome: Adequate for Discharge Goal: Vascular access site(s) Level 0-1 will be maintained 10/24/2023 1554 by Emil Roselie SAUNDERS, RN Outcome: Adequate for Discharge 10/24/2023 1554 by Emil Roselie SAUNDERS, RN Outcome: Adequate for Discharge   Problem: Health Behavior/Discharge Planning: Goal: Ability to safely manage health-related needs after discharge will improve 10/24/2023 1554 by Emil Roselie SAUNDERS, RN Outcome: Adequate for Discharge 10/24/2023 1554 by Emil Roselie SAUNDERS, RN Outcome: Adequate for Discharge

## 2023-10-24 NOTE — Progress Notes (Incomplete)
 Patient ID: ZURII HEWES, female   DOB: Jan 16, 1956, 68 y.o.   MRN: [id number]  Progress Note  Patient Name: [name] Date of Encounter: 10/24/2023  Primary Cardiologist: Jayson Sierras, MD  Subjective   Good diuresis again yesterday, I/Os net negative 6210.  Creatinine stable 1.1.  SBP 140s-150s.   Inpatient Medications    Scheduled Meds:  amLODipine  2.5 mg Oral Daily   apixaban  5 mg Oral BID   atorvastatin   40 mg Oral QODAY   dapagliflozin  propanediol  10 mg Oral Daily   escitalopram   20 mg Oral Q0600   insulin  aspart  0-15 Units Subcutaneous TID WC   insulin  aspart  3 Units Subcutaneous TID WC   insulin  glargine  20 Units Subcutaneous Daily   ipratropium  0.5 mg Nebulization BID   levalbuterol   0.63 mg Nebulization BID   sildenafil   20 mg Oral TID   sodium chloride  flush  3 mL Intravenous Q12H   Continuous Infusions:  PRN Meds: acetaminophen  **OR** acetaminophen , acetaminophen , bisacodyl , hydrALAZINE , HYDROmorphone  (DILAUDID ) injection, nystatin  cream, ondansetron  **OR** ondansetron  (ZOFRAN ) IV, oxyCODONE , senna-docusate, traZODone    Vital Signs    Vitals:   10/23/23 2334 10/24/23 0522 10/24/23 0739 10/24/23 0830  BP: 113/63 131/60 (!) 150/70   Pulse: 66 67    Resp: 20 18 18    Temp: 97.8 F (36.6 C) 98 F (36.7 C) 98.3 F (36.8 C)   TempSrc: Oral Oral Oral   SpO2: 100% 96% 98% 100%  Weight:  86 kg    Height:        Intake/Output Summary (Last 24 hours) at 10/24/2023 0941 Last data filed at 10/24/2023 0516 Gross per 24 hour  Intake 490 ml  Output 3550 ml  Net -3060 ml      10/24/2023    5:22 AM 10/23/2023    6:07 AM 10/22/2023    6:05 AM  Last 3 Weights  Weight (lbs) 189 lb 8 oz 194 lb 194 lb 9.6 oz  Weight (kg) 85.957 kg 87.998 kg 88.27 kg     Telemetry    NSR - Personally Reviewed  Physical Exam   General: NAD Neck: JVP 8-9 cm, no thyromegaly or thyroid  nodule.  Lungs: Distant BS.  CV: Nondisplaced PMI.  Heart regular S1/S2, no S3/S4,  no murmur.  No peripheral edema.  Abdomen: Soft, nontender, no hepatosplenomegaly, no distention.  Skin: Intact without lesions or rashes.  Neurologic: Alert and oriented x 3.  Psych: Normal affect. Extremities: No clubbing or cyanosis.  HEENT: Normal.   Labs    High Sensitivity Troponin:   Recent Labs  Lab 09/28/23 1028 09/28/23 1235 10/18/23 1033 10/18/23 1234  TROPONINIHS 7 7 7 7       Cardiac EnzymesNo results for input(s): TROPONINI in the last 168 hours. No results for input(s): TROPIPOC in the last 168 hours.   Chemistry Recent Labs  Lab 10/18/23 1033 10/19/23 0408 10/22/23 0453 10/23/23 0325 10/24/23 0459  NA 140   < > 135 136 138  K 4.0   < > 3.2* 3.9 4.0  CL 101   < > 93* 100 101  CO2 25   < > 29 27 25   GLUCOSE 163*   < > 238* 352* 210*  BUN 22   < > 26* 23 25*  CREATININE 0.86   < > 1.09* 1.10* 0.91  CALCIUM  8.8*   < > 8.8* 8.6* 8.8*  PROT 6.9  --   --   --   --  ALBUMIN  3.3*  --   --   --   --   AST 26  --   --   --   --   ALT 25  --   --   --   --   ALKPHOS 85  --   --   --   --   BILITOT 0.5  --   --   --   --   GFRNONAA >60   < > 56* 55* >60  ANIONGAP 14   < > 13 9 12    < > = values in this interval not displayed.     Hematology Recent Labs  Lab 10/21/23 0454 10/22/23 0453 10/23/23 0325  WBC 5.2 6.2 6.7  RBC 4.41 4.72 4.98  HGB 12.9 13.7 14.4  HCT 40.0 42.4 44.6  MCV 90.7 89.8 89.6  MCH 29.3 29.0 28.9  MCHC 32.3 32.3 32.3  RDW 15.6* 15.4 15.5  PLT 209 239 257    BNP Recent Labs  Lab 10/18/23 1033 10/19/23 0408 10/21/23 0454  BNP 373.0* 545.0* 289.0*     DDimer  Recent Labs  Lab 10/18/23 1033  DDIMER 3.12*     Radiology    No results found.  Patient Profile     68 y/o female w/ h/o chronic asthmatic bronchitis followed by Dr. Darlean, tobacco abuse, OSA on CPAP, Obesity, pulmonary HTN w/ associated RV dysfunction, CAD s/p anterior STEMI w/ DES to LAD in 2018, PSVT, moderate carotid disease by duplex 2023, recent  outpatient evaluation for syncope admitted for acute hypoxic respiratory failure and found to have a/c HFpEF w/ prominent RV dysfunction in the setting of severe PAH.   1. Pulmonary hypertension: Patient has severe pulmonary hypertension with evidence for RV dysfunction by echo.  Echo showed EF 70-75% with D-shaped interventricular septum, mild RV enlargement with mild-moderate RV dysfunction, PASP 77 mmHg, dilated IVC, mild aortic stenosis.   RHC showed elevated right and left heart filling pressures with mean PAP 64 mmHg, PVR 10 WU.  This appears to be combined group 1 and 2 PH, primarily group 1.  CTA chest with no significant lung parenchymal disease and no acute PE.  Prior pulmonary workup revealed OSA and mild asthma.  Patient is a prior smoker.  She is on CPAP at night.  She is not hypoxemic at rest. I do not think that OSA can explain the extent of her PH.  CI was low, 1.7 by Fick and 1.9 by thermodilution.  This is an ominous findings.  Her episodes of presyncope are also concerning.  V/Q scan concerning for chronic PE, reviewed with pulmonary.  Bubble study suggestive of PFO. HIV, ANA, SCL-70, anti-centromere Ab negative.   - HRCT to rule out ILD, significant parenchymal disease.   - She is on sildenafil  20 mg tid.  Would like to transition to riociguat as outpatient.  With elevated PCWP on RHC, will aim to repeat RHC Monday to document PCWP in normal limits so we can get riociguat. I discussed risks/benefits with patient and she agrees to the procedure.  - She will need close outpatient followup to for aggressive titration of pulmonary vasodilators.  - Will likely need referral to [facility] CTEPH program.  - Hold off on digoxin with periodic mild bradycardia.  2. Acute diastolic CHF: Elevated PCWP in setting of normal EF.  She has diuresed well, mild JVD on exam today.  - Lasix  80 mg IV x 1. Probably to po tomorrow depending on RHC.  -  Continue Farxiga  10 mg daily.   3. CAD: H/o anterior MI.  LHC  today with patent LAD stent, nonobstructive disease.  - Stopped ASA with apixaban use (see below).  - Continue statin.  4. OSA: Continue CPAP.  5. Active smoker: Prior PFTs not consistent with COPD and emphysema not noted on CTA chest.  Further assessment for pulmonary parenchymal disease with HRCT chest.   Needs to quit smoking.  6. Type 2 diabetes: per primary.  7. Carotid stenosis: Needs carotid dopplers. 8. 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 Reviewed with pulmonary.  She has no h/o clots.   - Sent hypercoagulable workup, results pending   - Started on Eliquis (holding this morning's dose for repeat RHC), resume post cath  - Will get lower extremity venous dopplers to look for DVT.  9. HTN: Add amlodipine 2.5 mg daily.   Caffie Shed, PA-C  10/24/2023 9:41 AM

## 2023-10-24 NOTE — Interval H&P Note (Signed)
 History and Physical Interval Note:  10/24/2023 10:08 AM  [name]  has presented today for surgery, with the diagnosis of hf.  The various methods of treatment have been discussed with the patient and family. After consideration of risks, benefits and other options for treatment, the patient has consented to  Procedure(s): RIGHT HEART CATH (N/A) as a surgical intervention.  The patient's history has been reviewed, patient examined, no change in status, stable for surgery.  I have reviewed the patient's chart and labs.  Questions were answered to the patient's satisfaction.     Sotero Brinkmeyer [employer]

## 2023-10-24 NOTE — Discharge Summary (Signed)
 Physician Discharge Summary  [name]:[id number] DOB: 08/07/1955 DOA: 10/18/2023  PCP: Morgan Suzzane POUR, MD  Admit date: 10/18/2023 Discharge date: 10/24/2023  Time spent: 45 minutes  Recommendations for Outpatient Follow-up:  CHF clinic in 1 week, check BMP at FU   Discharge Diagnoses:  Principal Problem:   Acute on chronic respiratory failure with hypoxia [facility]) Pulmonary hypertension Acute on chronic diastolic CHF   Anxiety   Carotid artery stenosis   Essential hypertension   Depression   GERD (gastroesophageal reflux disease)   CAD (coronary artery disease)   COPD (chronic obstructive pulmonary disease) (HCC)   Mixed hyperlipidemia   Aortic stenosis   Type 2 diabetes mellitus with hyperglycemia (HCC)   DOE (dyspnea on exertion)   Obesity, Class I, BMI 30.0-34.9 (see actual BMI)   Hypomagnesemia   Pulmonary hypertension, unspecified (HCC)   Discharge Condition: Proved  Diet recommendation: Diabetic, heart healthy  Filed Weights   10/22/23 0605 10/23/23 0607 10/24/23 0522  Weight: 88.3 kg 88 kg 86 kg    History of present illness:  68 year old female with history of pulmonary hypertension associated with right ventricular dysfunction, OSA on CPAP, asthmatic bronchitis, aortic stenosis, coronary artery disease, non-STEMI status post PCI, COPD, hypertension, hyperlipidemia, morbid obesity, diabetes type 2, anxiety who presented after she was referred from pulmonology office for the evaluation of acute on chronic hypoxic respiratory failure, increased cough, congestion, shortness of breath, dizziness. On presentation, she was hypoxic and was put on 2 L of oxygen  per minute. COVID/flu/RSV negative. CTA chest negative for PE, showed dilation of main pulmonary artery reflecting underlying pulmonary hypertension.   Hospital Course:   Acute on chronic HFpEF/pulmonary hypertension/chronic PE: - Presented with dyspnea with minimal exertion, hypoxia  -CT chest negative  for PE but showed dilation of main pulmonary artery consistent with pulmonary hypertension.   -2D echo showed hyperdynamic left ventricular function with EF of 70 to 75%, grade 2 diastolic dysfunction, features suggesting right ventricular pressure and volume overload.   -Heart failure team consulted, felt to be combination of group 1 and 2 pulmonary hypertension,  -RHC noted elevated right and left heart pressures mean PAP 64, PVR 10 CTA chest noted no significant parenchymal lung disease, she is a former smoker, VQ scan concerning for chronic PE, bubble study suggestive of PFO, serologies negative - Improved with diuresis, continued on sildenafil  - Dr. Rolan recommended outpatient follow-up for titration of pulmonary vasodilators and referral to [facility] program   History of coronary artery disease: No anginal symptoms history of NSTEMI in 2016 treated with LAD stent.  LHC done on 9/25 showed nonobstructive disease and patent LAD -Continue apixaban and statin   History of OSA:  - Continue CPAP   History of asthmatic bronchitis: Follows with Dr. Darlean.  Continue bronchodilators as needed.    Hyperlipidemia: On statin   Hypertension: On Diovan , Imdur , metoprolol  at home.   Anxiety/depression: As needed Xanax .  Also on Lexapro    GERD: Continue PPI   Hypokalemia: Supplemented with potassium   Tobacco use: 30+ years smoking history.  Trying to quit.  Counseled cessation.  Continue nicotine  patch.  Still smokes 5 cigarettes a day   Diabetes type 2: Takes Farxiga  at home. A1c of 7.3.  Blood sugars are up today, he started on insulin .  We may need to add metformin  on discharge   History of bilateral carotid artery disease: Continue aspirin , statin.   -  Followed by Dr. Sheree , vascular surgery  Consultations: Advanced heart  failure team Structural heart team Discharge Exam: Vitals:   10/24/23 1029 10/24/23 1124  BP: (!) 152/66 106/88  Pulse: 76   Resp: (!) 22   Temp:    SpO2:  96%     Gen: Awake, Alert, Oriented X 3,  HEENT: no JVD Lungs: Good air movement bilaterally, CTAB CVS: S1S2/RRR Abd: soft, Non tender, non distended, BS present Extremities: No edema Skin: no new rashes on exposed skin   Discharge Instructions    Allergies as of 10/24/2023       Reactions   Codeine Nausea And Vomiting   Morphine Nausea And Vomiting   Sulfa Antibiotics Rash        Medication List     STOP taking these medications    aspirin  EC 81 MG tablet   furosemide  20 MG tablet Commonly known as: LASIX    isosorbide  mononitrate 30 MG 24 hr tablet Commonly known as: IMDUR    metoprolol  succinate 25 MG 24 hr tablet Commonly known as: TOPROL -XL   valsartan  40 MG tablet Commonly known as: Diovan        TAKE these medications    amLODipine 5 MG tablet Commonly known as: NORVASC Take 1 tablet (5 mg total) by mouth daily. Start taking on: October 25, 2023   atorvastatin  40 MG tablet Commonly known as: LIPITOR  TAKE 1 TABLET (40 MG TOTAL) BY MOUTH EVERY OTHER DAY. TAKE AT DINNER   Breyna  80-4.5 MCG/ACT inhaler Generic drug: budesonide -formoterol  Inhale 2 puffs into the lungs 2 (two) times daily.   Eliquis 5 MG Tabs tablet Generic drug: apixaban Take 1 tablet (5 mg total) by mouth 2 (two) times daily.   escitalopram  20 MG tablet Commonly known as: LEXAPRO  TAKE 1 TABLET (20 MG TOTAL) BY MOUTH DAILY AT 6 (SIX) AM.   Farxiga  10 MG Tabs tablet Generic drug: dapagliflozin  propanediol TAKE 1 TABLET BY MOUTH EVERY DAY   FreeStyle Libre 3 Plus Sensor Misc Change sensor every 15 days.   nystatin  cream Commonly known as: MYCOSTATIN  Apply 1 Application topically 2 (two) times daily.   sildenafil  20 MG tablet Commonly known as: Revatio  Take 1 tablet (20 mg total) by mouth 3 (three) times daily.   torsemide 20 MG tablet Commonly known as: DEMADEX Take 1 tablet (20 mg total) by mouth daily. Start taking on: October 25, 2023       Allergies   Allergen Reactions   Codeine Nausea And Vomiting   Morphine Nausea And Vomiting   Sulfa Antibiotics Rash    Follow-up Information     [facility] Follow up.   Specialty: Cardiology Why: 11/01/23 at 11:30 AM   Hospital Follow Up in The [facility] at [facility], Entrance C ([facility] [place]) Contact information: [address] [place]  [phone]                 The results of significant diagnostics from this hospitalization (including imaging, microbiology, ancillary and laboratory) are listed below for reference.    Significant Diagnostic Studies: VAS US  LOWER EXTREMITY VENOUS (DVT) Result Date: 10/24/2023  Lower Venous DVT Study Patient Name:  Morgan KUEHL  Date of Exam:   10/24/2023 Medical Rec #: [id number]       Accession #:    [id number] Date of Birth: May 20, 1955       Patient Gender: F Patient Age:   68 years Exam Location:  [facility] Procedure:  VAS US  LOWER EXTREMITY VENOUS (DVT) Referring Phys: [facility] --------------------------------------------------------------------------------  Indications: SOB.  Comparison Study: Previous exam on 06/20/2014 was negative for DVT Performing Technologist: Ezzie Potters RVT, RDMS  Examination Guidelines: A complete evaluation includes B-mode imaging, spectral Doppler, color Doppler, and power Doppler as needed of all accessible portions of each vessel. Bilateral testing is considered an integral part of a complete examination. Limited examinations for reoccurring indications may be performed as noted. The reflux portion of the exam is performed with the patient in reverse Trendelenburg.  +---------+---------------+---------+-----------+----------+--------------+ RIGHT    CompressibilityPhasicitySpontaneityPropertiesThrombus Aging +---------+---------------+---------+-----------+----------+--------------+  CFV      Full           Yes      Yes                                 +---------+---------------+---------+-----------+----------+--------------+ SFJ      Full                                                        +---------+---------------+---------+-----------+----------+--------------+ FV Prox  Full           Yes      Yes                                 +---------+---------------+---------+-----------+----------+--------------+ FV Mid   Full           Yes      Yes                                 +---------+---------------+---------+-----------+----------+--------------+ FV DistalFull           Yes      Yes                                 +---------+---------------+---------+-----------+----------+--------------+ PFV      Full                                                        +---------+---------------+---------+-----------+----------+--------------+ POP      Full           Yes      Yes                                 +---------+---------------+---------+-----------+----------+--------------+ PTV      Full                                                        +---------+---------------+---------+-----------+----------+--------------+ PERO     Full                                                        +---------+---------------+---------+-----------+----------+--------------+   +---------+---------------+---------+-----------+----------+--------------+  LEFT     CompressibilityPhasicitySpontaneityPropertiesThrombus Aging +---------+---------------+---------+-----------+----------+--------------+ CFV      Full           Yes      Yes                                 +---------+---------------+---------+-----------+----------+--------------+ SFJ      Full                                                        +---------+---------------+---------+-----------+----------+--------------+ FV Prox  Full           Yes      Yes                                  +---------+---------------+---------+-----------+----------+--------------+ FV Mid   Full           Yes      Yes                                 +---------+---------------+---------+-----------+----------+--------------+ FV DistalFull           Yes      Yes                                 +---------+---------------+---------+-----------+----------+--------------+ PFV      Full                                                        +---------+---------------+---------+-----------+----------+--------------+ POP      Full           Yes      Yes                                 +---------+---------------+---------+-----------+----------+--------------+ PTV      Full                                                        +---------+---------------+---------+-----------+----------+--------------+ PERO     Full                                                        +---------+---------------+---------+-----------+----------+--------------+     Summary: BILATERAL: - No evidence of deep vein thrombosis seen in the lower extremities, bilaterally. -No evidence of popliteal cyst, bilaterally.   *See table(s) above for measurements and observations.    Preliminary    CT Chest High Resolution Result Date: 10/24/2023 CLINICAL DATA:  Diffuse interstitial lung disease EXAM: CT CHEST WITHOUT CONTRAST TECHNIQUE: Multidetector CT imaging of  the chest was performed following the standard protocol without intravenous contrast. High resolution imaging of the lungs, as well as inspiratory and expiratory imaging, was performed. RADIATION DOSE REDUCTION: This exam was performed according to the departmental dose-optimization program which includes automated exposure control, adjustment of the mA and/or kV according to patient size and/or use of iterative reconstruction technique. COMPARISON:  CT chest angio October 18, 2018 FINDINGS: Cardiovascular: Cardiomegaly.  Atherosclerotic calcifications of coronary arteries. No pericardial effusion. Pulmonary artery is dilated up to 3.4 cm which can be seen the setting of pulmonary arterial hypertension. Mediastinum/Nodes: Right thyroid  lobe hypodense nodule measuring 1 cm. Multiple subcentimeter mediastinal lymph in prevascular, upper and lower paratracheal subcarinal nodal stations. Lungs/Pleura: Mosaic ground-glass attenuation on expiratory phase. Scattered pulmonary micro nodules for example in right middle lobe (10/48), right upper lobe (3/5). No significant emphysematous changes, honeycombing or bronchiectasis. No consolidation. No pleural effusion. There is diffuse bronchial and bronchiolar wall thickening. Trachea and major airways are patent. Upper Abdomen: Cholecystectomy. Musculoskeletal: No suspicious osseous lesion. Degenerative changes of the spine status post ACDF. IMPRESSION: Mosaic ground-glass attenuation on expiratory phase suggestive of air trapping which can be seen the setting of small airway disease such as hypersensitivity pneumonitis. Correlate with clinical findings. Few scattered pulmonary micro nodules. Follow-up according to guidelines below. Cardiomegaly. Enlarged pulmonary artery suggestive of pulmonary artery hypertension. No follow-up needed if patient is low-risk (and has no known or suspected primary neoplasm). Non-contrast chest CT can be considered in 12 months if patient is high-risk. This recommendation follows the consensus statement: Guidelines for Management of Incidental Pulmonary Nodules Detected on CT Images: From the Fleischner Society 2017; Radiology 2017; 284:228-243. Electronically Signed   By: Megan  Zare M.D.   On: 10/24/2023 11:47   CARDIAC CATHETERIZATION Result Date: 10/24/2023 1. Normal right and left heart filling pressures. 2. Severe pulmonary arterial hypertension. 3. Preserved cardiac output.   NM Pulmonary Perfusion Addendum Date: 10/22/2023 ADDENDUM REPORT: 10/22/2023  14:59 ADDENDUM: One moderate size peripheral perfusion defect and multiple small peripheral perfusion defects. Categorize scan as low to intermediate probability by PIOPED II criteria. Electronically Signed   By: Jackquline Boxer M.D.   On: 10/22/2023 14:59   Result Date: 10/22/2023 CLINICAL DATA:  Pulmonary hypertension. EXAM: NUCLEAR MEDICINE PERFUSION LUNG SCAN TECHNIQUE: Perfusion images were obtained in multiple projections after intravenous injection of radiopharmaceutical. RADIOPHARMACEUTICALS:  4.4 mCi Tc-72m MAA COMPARISON:  Chest radiograph 10/18/2023, CT 10/18/2023 FINDINGS: There are several peripheral perfusion defects. One in the lateral RIGHT lower lobe. Probable defect in the posterior LEFT lower lobe. Defect at the RIGHT lung base additionally. IMPRESSION: Several small peripheral perfusion defects. Negative CTPA. 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 Electronically Signed: By: Jackquline Boxer M.D. On: 10/21/2023 15:01   ECHOCARDIOGRAM COMPLETE BUBBLE STUDY Result Date: 10/21/2023    ECHOCARDIOGRAM REPORT   Patient Name:   AARNA Aguilar Date of Exam: 10/21/2023 Medical Rec #:  [id number]      Height:       64.0 in Accession #:    [id number]     Weight:       196.1 lb Date of Birth:  02-Jun-1955      BSA:          1.940 m Patient Age:    67 years       BP:           110/56 mmHg Patient Gender: F              HR:  59 bpm. Exam Location:  Inpatient Procedure: 2D Echo and Saline Contrast Bubble Study (Both Spectral and Color            Flow Doppler were utilized during procedure). Indications:    CHF Pulm HTN  History:        Patient has prior history of Echocardiogram examinations. CHF,                 Pulmonary HTN; Signs/Symptoms:Dyspnea.  Sonographer:    Norleen Amour Referring Phys: 42 BRITTAINY M SIMMONS IMPRESSIONS  1. Cannot exclude a small PFO. Agitated saline contrast bubble study was positive with shunting observed within 3-6 cardiac cycles suggestive of  interatrial shunt.  2. Left ventricular ejection fraction, by estimation, is 65 to 70%. The left ventricle has normal function. The left ventricle has no regional wall motion abnormalities. Left ventricular diastolic parameters were normal.  3. Right ventricular systolic function is normal. The right ventricular size is normal. There is mildly elevated pulmonary artery systolic pressure. The estimated right ventricular systolic pressure is 38.0 mmHg.  4. The mitral valve is normal in structure. Trivial mitral valve regurgitation. No evidence of mitral stenosis.  5. The aortic valve is calcified. There is moderate calcification of the aortic valve. There is mild thickening of the aortic valve. Aortic valve regurgitation is not visualized. Mild aortic valve stenosis. Aortic valve mean gradient measures 9.3 mmHg. Aortic valve Vmax measures 2.12 m/s.  6. The inferior vena cava is normal in size with greater than 50% respiratory variability, suggesting right atrial pressure of 3 mmHg. FINDINGS  Left Ventricle: Left ventricular ejection fraction, by estimation, is 65 to 70%. The left ventricle has normal function. The left ventricle has no regional wall motion abnormalities. The left ventricular internal cavity size was normal in size. There is  no left ventricular hypertrophy. Left ventricular diastolic parameters were normal. Right Ventricle: The right ventricular size is normal. No increase in right ventricular wall thickness. Right ventricular systolic function is normal. There is mildly elevated pulmonary artery systolic pressure. The tricuspid regurgitant velocity is 2.40  m/s, and with an assumed right atrial pressure of 15 mmHg, the estimated right ventricular systolic pressure is 38.0 mmHg. Left Atrium: Left atrial size was normal in size. Right Atrium: Right atrial size was normal in size. Pericardium: There is no evidence of pericardial effusion. Mitral Valve: The mitral valve is normal in structure. Trivial  mitral valve regurgitation. No evidence of mitral valve stenosis. Tricuspid Valve: The tricuspid valve is normal in structure. Tricuspid valve regurgitation is mild . No evidence of tricuspid stenosis. Aortic Valve: The aortic valve is calcified. There is moderate calcification of the aortic valve. There is mild thickening of the aortic valve. Aortic valve regurgitation is not visualized. Mild aortic stenosis is present. Aortic valve mean gradient measures 9.3 mmHg. Aortic valve peak gradient measures 18.0 mmHg. Aortic valve area, by VTI measures 0.76 cm. Pulmonic Valve: The pulmonic valve was normal in structure. Pulmonic valve regurgitation is trivial. No evidence of pulmonic stenosis. Aorta: The aortic root is normal in size and structure. Venous: The inferior vena cava is normal in size with greater than 50% respiratory variability, suggesting right atrial pressure of 3 mmHg. IAS/Shunts: Cannot exclude a small PFO. Agitated saline contrast was given intravenously to evaluate for intracardiac shunting. Agitated saline contrast bubble study was positive with shunting observed within 3-6 cardiac cycles suggestive of interatrial shunt.  LEFT VENTRICLE PLAX 2D LVIDd:         4.50  cm     Diastology LVIDs:         2.50 cm     LV e' medial:    8.26 cm/s LV PW:         0.70 cm     LV E/e' medial:  13.1 LV IVS:        0.60 cm     LV e' lateral:   8.55 cm/s LVOT diam:     1.40 cm     LV E/e' lateral: 12.6 LV SV:         34 LV SV Index:   18 LVOT Area:     1.54 cm  LV Volumes (MOD) LV vol d, MOD A2C: 63.3 ml LV vol d, MOD A4C: 54.3 ml LV vol s, MOD A2C: 22.1 ml LV vol s, MOD A4C: 13.1 ml LV SV MOD A2C:     41.2 ml LV SV MOD A4C:     54.3 ml LV SV MOD BP:      42.2 ml RIGHT VENTRICLE             IVC RV Basal diam:  3.40 cm     IVC diam: 1.90 cm RV S prime:     12.10 cm/s TAPSE (M-mode): 1.9 cm LEFT ATRIUM             Index        RIGHT ATRIUM           Index LA diam:        3.40 cm 1.75 cm/m   RA Area:     12.00 cm LA  Vol (A2C):   31.5 ml 16.24 ml/m  RA Volume:   28.70 ml  14.79 ml/m LA Vol (A4C):   34.0 ml 17.53 ml/m LA Biplane Vol: 35.7 ml 18.40 ml/m  AORTIC VALVE                     PULMONIC VALVE AV Area (Vmax):    0.73 cm      PV Vmax:       0.89 m/s AV Area (Vmean):   0.71 cm      PV Peak grad:  3.2 mmHg AV Area (VTI):     0.76 cm AV Vmax:           212.00 cm/s AV Vmean:          141.333 cm/s AV VTI:            0.452 m AV Peak Grad:      18.0 mmHg AV Mean Grad:      9.3 mmHg LVOT Vmax:         101.00 cm/s LVOT Vmean:        65.000 cm/s LVOT VTI:          0.223 m LVOT/AV VTI ratio: 0.49  AORTA Ao Root diam: 2.30 cm Ao Asc diam:  2.90 cm MITRAL VALVE                TRICUSPID VALVE MV Area (PHT): 3.46 cm     TR Peak grad:   23.0 mmHg MV Decel Time: 219 msec     TR Vmax:        240.00 cm/s MV E velocity: 108.00 cm/s MV A velocity: 59.70 cm/s   SHUNTS MV E/A ratio:  1.81         Systemic VTI:  0.22 m  Systemic Diam: 1.40 cm Oneil Parchment MD Electronically signed by Oneil Parchment MD Signature Date/Time: 10/21/2023/2:50:19 PM    Final    CARDIAC CATHETERIZATION Result Date: 10/20/2023   Prox LAD lesion is 10% stenosed.   Mid RCA lesion is 30% stenosed.   Previously placed Mid LAD to Dist LAD stent of unknown type is  widely patent. 1.  Mild, nonobstructive coronary artery disease with patent mid to distal LAD stent. 2.  Fick cardiac output of 3.4 liters per minute and Fick cardiac index of 1.7 L/min/m      TD cardiac output of 3.7 L/min and TD cardiac output of 1.9 L/min/m   RA pressure mean of 22 mmHg   RV pressure 114/4 with an end-diastolic pressure of 24 mmHg   PA pressure 114/35 with a mean PA pressure of 64 mmHg   Wedge pressure mean of 26 mmHg   PA pulsatility index of 3.6   PVR of 11.2 Woods units by Fick; 10.3 Woods units by TD 3.  LVEDP of 29 mmHg 4.  Breathing room air the patient's noninvasive oxygen  saturation was in the 80s; ABG drawn in the cardiac catheterization laboratory  demonstrated a pH of 7.4, pCO2 of 50, and PaO2 of 46 Summary: Elevated biventricular filling pressures with wedge of 26 mmHg, RA of 22 mmHg, and PVR of greater than 10 Wood units.  Recommend advanced heart failure consultation.   ECHOCARDIOGRAM COMPLETE Result Date: 10/19/2023    ECHOCARDIOGRAM REPORT   Patient Name:   Morgan TRIGUEROS Date of Exam: 10/19/2023 Medical Rec #:  [id number]      Height:       64.0 in Accession #:    [id number]     Weight:       205.5 lb Date of Birth:  01/24/1956      BSA:          1.979 m Patient Age:    67 years       BP:           145/64 mmHg Patient Gender: F              HR:           64 bpm. Exam Location:  [facility] Procedure: 2D Echo, Cardiac Doppler, Color Doppler, Strain Analysis and            Intracardiac Opacification Agent (Both Spectral and Color Flow            Doppler were utilized during procedure). Indications:    CHF- Acute Diastolic l50.31  History:        Patient has prior history of Echocardiogram examinations, most                 recent 11/22/2021. CHF, Previous Myocardial Infarction and CAD,                 COPD; Risk Factors:Hypertension, Diabetes, Dyslipidemia, Current                 Smoker and Sleep Apnea.  Sonographer:    Aida Pizza RCS Referring Phys: [phone] SEYED A SHAHMEHDI  Sonographer Comments: Global longitudinal strain was attempted. IMPRESSIONS  1. Left ventricular ejection fraction, by estimation, is 70 to 75%. The left ventricle has hyperdynamic function. The left ventricle has no regional wall motion abnormalities. Left ventricular diastolic parameters are consistent with Grade II diastolic dysfunction (pseudonormalization). Elevated left atrial pressure. There is the interventricular septum is flattened in systole and diastole, consistent  with right ventricular pressure and volume overload.  2. Right ventricular systolic function is mildly to moderately reduced. The right ventricular size is mildly enlarged. There is severely elevated  pulmonary artery systolic pressure. The estimated right ventricular systolic pressure is 77.4 mmHg.  3. The mitral valve is degenerative. Trivial mitral valve regurgitation.  4. Tricuspid valve regurgitation is mild to moderate.  5. The aortic valve is tricuspid. There is mild calcification of the aortic valve. Aortic valve regurgitation is trivial. Mild aortic valve stenosis. Aortic valve mean gradient measures 10.0 mmHg. Dimentionless index 0.51.  6. The inferior vena cava is dilated in size with <50% respiratory variability, suggesting right atrial pressure of 15 mmHg. Comparison(s): Prior images reviewed side by side. LVEF 70-75% with moderate diastolic dysfunction. Severely elevated estimated RVSP of 77 mmHg with RV dysfunction. Mild aortic stenosis. Mild to moderate tricuspid regurgitation. FINDINGS  Left Ventricle: Left ventricular ejection fraction, by estimation, is 70 to 75%. The left ventricle has hyperdynamic function. The left ventricle has no regional wall motion abnormalities. Definity  contrast agent was given IV to delineate the left ventricular endocardial borders. The left ventricular internal cavity size was normal in size. There is borderline concentric left ventricular hypertrophy. The interventricular septum is flattened in systole and diastole, consistent with right ventricular pressure and volume overload. Left ventricular diastolic parameters are consistent with Grade II diastolic dysfunction (pseudonormalization). Elevated left atrial pressure. Right Ventricle: The right ventricular size is mildly enlarged. No increase in right ventricular wall thickness. Right ventricular systolic function is mildly reduced. There is severely elevated pulmonary artery systolic pressure. The tricuspid regurgitant velocity is 3.95 m/s, and with an assumed right atrial pressure of 15 mmHg, the estimated right ventricular systolic pressure is 77.4 mmHg. Left Atrium: Left atrial size was normal in size. Right  Atrium: Right atrial size was normal in size. Pericardium: There is no evidence of pericardial effusion. Presence of epicardial fat layer. Mitral Valve: The mitral valve is degenerative in appearance. Mild to moderate mitral annular calcification. Trivial mitral valve regurgitation. Tricuspid Valve: The tricuspid valve is grossly normal. Tricuspid valve regurgitation is mild to moderate. Aortic Valve: The aortic valve is tricuspid. There is mild calcification of the aortic valve. There is mild aortic valve annular calcification. Aortic valve regurgitation is trivial. Mild aortic stenosis is present. Aortic valve mean gradient measures 10.0 mmHg. Aortic valve peak gradient measures 21.2 mmHg. Aortic valve area, by VTI measures 1.16 cm. Pulmonic Valve: The pulmonic valve was grossly normal. Pulmonic valve regurgitation is mild. Aorta: The aortic root is normal in size and structure. Venous: The inferior vena cava is dilated in size with less than 50% respiratory variability, suggesting right atrial pressure of 15 mmHg. IAS/Shunts: No atrial level shunt detected by color flow Doppler. Additional Comments: 3D was performed not requiring image post processing on an independent workstation and was indeterminate.  LEFT VENTRICLE PLAX 2D LVIDd:         4.00 cm   Diastology LVIDs:         2.30 cm   LV e' medial:    6.84 cm/s LV PW:         1.00 cm   LV E/e' medial:  16.5 LV IVS:        1.00 cm   LV e' lateral:   8.45 cm/s LVOT diam:     1.70 cm   LV E/e' lateral: 13.4 LV SV:         57 LV SV Index:   29  LVOT Area:     2.27 cm  RIGHT VENTRICLE RV S prime:     11.80 cm/s TAPSE (M-mode): 1.8 cm LEFT ATRIUM             Index        RIGHT ATRIUM           Index LA diam:        3.80 cm 1.92 cm/m   RA Area:     13.80 cm LA Vol (A2C):   28.9 ml 14.60 ml/m  RA Volume:   32.60 ml  16.47 ml/m LA Vol (A4C):   40.9 ml 20.67 ml/m LA Biplane Vol: 35.5 ml 17.94 ml/m  AORTIC VALVE AV Area (Vmax):    1.13 cm AV Area (Vmean):   1.31  cm AV Area (VTI):     1.16 cm AV Vmax:           230.00 cm/s AV Vmean:          143.000 cm/s AV VTI:            0.486 m AV Peak Grad:      21.2 mmHg AV Mean Grad:      10.0 mmHg LVOT Vmax:         114.00 cm/s LVOT Vmean:        82.800 cm/s LVOT VTI:          0.249 m LVOT/AV VTI ratio: 0.51  AORTA Ao Root diam: 3.00 cm MITRAL VALVE                TRICUSPID VALVE MV Area (PHT): 5.02 cm     TR Peak grad:   62.4 mmHg MV Decel Time: 151 msec     TR Vmax:        395.00 cm/s MV E velocity: 113.00 cm/s MV A velocity: 55.50 cm/s   SHUNTS MV E/A ratio:  2.04         Systemic VTI:  0.25 m                             Systemic Diam: 1.70 cm Jayson Sierras MD Electronically signed by Jayson Sierras MD Signature Date/Time: 10/19/2023/1:32:04 PM    Final    CT Angio Chest PE W and/or Wo Contrast Result Date: 10/18/2023 CLINICAL DATA:  Pulmonary embolism (PE) suspected, high prob Pt arrived via POV from Dr Leisa office for evaluation of possible PE. Pt endorses SOB with exertion and recent travel to [place]. Pt endorses chest tightness as well EXAM: CT ANGIOGRAPHY CHEST WITH CONTRAST TECHNIQUE: Multidetector CT imaging of the chest was performed using the standard protocol during bolus administration of intravenous contrast. Multiplanar CT image reconstructions and MIPs were obtained to evaluate the vascular anatomy. RADIATION DOSE REDUCTION: This exam was performed according to the departmental dose-optimization program which includes automated exposure control, adjustment of the mA and/or kV according to patient size and/or use of iterative reconstruction technique. CONTRAST:  75mL OMNIPAQUE  IOHEXOL  350 MG/ML SOLN COMPARISON:  Chest XR, 10/18/2023.  CT chest, 09/28/2023 FINDINGS: Cardiovascular: Satisfactory opacification of the pulmonary arteries to the segmental level. No segmental or larger pulmonary embolus. Dilation of the main PA, measuring 3.6 cm. Normal heart size. No pericardial effusion. Moderate burden of calcified  coronary atherosclerosis, greatest within the LAD. Mediastinum/Nodes: No enlarged mediastinal, hilar, or axillary lymph nodes. Thyroid  gland, trachea, and esophagus demonstrate no significant findings. Lungs/Pleura: Lungs are clear without focal consolidation, mass or  suspicious pulmonary nodule. No pleural effusion or pneumothorax. Upper Abdomen: No acute abnormality. Musculoskeletal: No acute chest wall abnormality. Postsurgical changes of cervical fusion, incompletely imaged. No acute osseous findings. Review of the MIP images confirms the above findings. IMPRESSION: 1. No segmental or larger pulmonary embolus. No acute intrathoracic abnormality. 2. Main pulmonary artery dilation, likely reflecting underlying pulmonary arterial hypertension. Electronically Signed   By: Thom Hall M.D.   On: 10/18/2023 13:09   DG Chest Port 1 View Result Date: 10/18/2023 CLINICAL DATA:  Dyspnea EXAM: PORTABLE CHEST 1 VIEW COMPARISON:  September 28, 2023 FINDINGS: No focal consolidation.  No pleural effusions.  No pneumothorax. Unchanged mildly enlarged cardiomediastinal silhouette likely exaggerated by AP technique. No acute osseous findings. IMPRESSION: No acute findings. Electronically Signed   By: Michaeline Blanch M.D.   On: 10/18/2023 11:20   CT Chest Wo Contrast Result Date: 09/28/2023 CLINICAL DATA:  Blunt chest Trauma after fall. Dizziness and diaphoresis with shortness of breath. EXAM: CT CHEST WITHOUT CONTRAST TECHNIQUE: Multidetector CT imaging of the chest was performed following the standard protocol without IV contrast. RADIATION DOSE REDUCTION: This exam was performed according to the departmental dose-optimization program which includes automated exposure control, adjustment of the mA and/or kV according to patient size and/or use of iterative reconstruction technique. COMPARISON:  02/03/2023 FINDINGS: Cardiovascular: Coronary, aortic arch, and branch vessel atherosclerotic vascular disease. Mild cardiomegaly. Mild  calcification of the aortic valve. Mediastinum/Nodes: Sub-centimeter in short axis prevascular, AP window, left supraclavicular, and paratracheal lymph nodes are identified. Index right paratracheal node 0.8 cm in short axis on image 49 series 2, formerly 0.6 cm on 02/03/2023. Although not pathologically enlarged these could be reactive. Continued prominence of the main pulmonary artery favoring pulmonary arterial hypertension. Lungs/Pleura: Old granulomatous disease noted. Stable accentuated aeration in a cluster of secondary pulmonary lobules laterally in the left upper lobe is unchanged from 02/03/2023. Stable 5 by 3 mm left lower lobe nodule on image 86 series 3. Stable 4 by 3 mm left upper lobe nodule on image 59 series 3. Upper Abdomen: Unremarkable Musculoskeletal: Lower cervical plate and screw fixator. Thoracic spondylosis. IMPRESSION: 1. No acute findings. 2. Stable (from 11/03/2022) small left lung nodules under 5 mm in average diameter. No further imaging workup of these lesions is indicated. 3. Continued prominence of the main pulmonary artery favoring pulmonary arterial hypertension. 4. Mild cardiomegaly. 5. Thoracic spondylosis. 6.  Aortic Atherosclerosis (ICD10-I70.0). Electronically Signed   By: Ryan Salvage M.D.   On: 09/28/2023 12:45   DG Ribs Unilateral W/Chest Left Result Date: 09/28/2023 CLINICAL DATA:  fall - thoracic pain EXAM: LEFT RIBS AND CHEST - 3+ VIEW COMPARISON:  None available FINDINGS: No focal airspace consolidation, pleural effusion, or pneumothorax. No cardiomegaly. No acute, displaced rib fracture or destructive lesion. IMPRESSION: No acute, displaced rib fracture. Otherwise, clear lungs. Electronically Signed   By: Rogelia Myers M.D.   On: 09/28/2023 11:18    Microbiology: Recent Results (from the past 240 hours)  Resp panel by RT-PCR (RSV, Flu A&B, Covid) Anterior Nasal Swab     Status: None   Collection Time: 10/18/23 10:14 AM   Specimen: Anterior Nasal Swab   Result Value Ref Range Status   SARS Coronavirus 2 by RT PCR NEGATIVE NEGATIVE Final    Comment: (NOTE) SARS-CoV-2 target nucleic acids are NOT DETECTED.  The SARS-CoV-2 RNA is generally detectable in upper respiratory specimens during the acute phase of infection. The lowest concentration of SARS-CoV-2 viral copies this assay can detect  is 138 copies/mL. A negative result does not preclude SARS-Cov-2 infection and should not be used as the sole basis for treatment or other patient management decisions. A negative result may occur with  improper specimen collection/handling, submission of specimen other than nasopharyngeal swab, presence of viral mutation(s) within the areas targeted by this assay, and inadequate number of viral copies(<138 copies/mL). A negative result must be combined with clinical observations, patient history, and epidemiological information. The expected result is Negative.  Fact Sheet for Patients:  BloggerCourse.com  Fact Sheet for Healthcare Providers:  SeriousBroker.it  This test is no t yet approved or cleared by the [place]  FDA and  has been authorized for detection and/or diagnosis of SARS-CoV-2 by FDA under an Emergency Use Authorization (EUA). This EUA will remain  in effect (meaning this test can be used) for the duration of the COVID-19 declaration under Section 564(b)(1) of the Act, 21 U.S.C.section 360bbb-3(b)(1), unless the authorization is terminated  or revoked sooner.       Influenza A by PCR NEGATIVE NEGATIVE Final   Influenza B by PCR NEGATIVE NEGATIVE Final    Comment: (NOTE) The Xpert Xpress SARS-CoV-2/FLU/RSV plus assay is intended as an aid in the diagnosis of influenza from Nasopharyngeal swab specimens and should not be used as a sole basis for treatment. Nasal washings and aspirates are unacceptable for Xpert Xpress SARS-CoV-2/FLU/RSV testing.  Fact Sheet for  Patients: BloggerCourse.com  Fact Sheet for Healthcare Providers: SeriousBroker.it  This test is not yet approved or cleared by the [place]  FDA and has been authorized for detection and/or diagnosis of SARS-CoV-2 by FDA under an Emergency Use Authorization (EUA). This EUA will remain in effect (meaning this test can be used) for the duration of the COVID-19 declaration under Section 564(b)(1) of the Act, 21 U.S.C. section 360bbb-3(b)(1), unless the authorization is terminated or revoked.     Resp Syncytial Virus by PCR NEGATIVE NEGATIVE Final    Comment: (NOTE) Fact Sheet for Patients: BloggerCourse.com  Fact Sheet for Healthcare Providers: SeriousBroker.it  This test is not yet approved or cleared by the [place]  FDA and has been authorized for detection and/or diagnosis of SARS-CoV-2 by FDA under an Emergency Use Authorization (EUA). This EUA will remain in effect (meaning this test can be used) for the duration of the COVID-19 declaration under Section 564(b)(1) of the Act, 21 U.S.C. section 360bbb-3(b)(1), unless the authorization is terminated or revoked.  Performed at [facility], [address] [place], [place] [address]      Labs: Basic Metabolic Panel: Recent Labs  Lab 10/18/23 1033 10/18/23 1512 10/19/23 0408 10/20/23 9147 10/20/23 1337 10/20/23 1355 10/21/23 0454 10/22/23 0453 10/23/23 0325 10/24/23 0459  NA 140  --    < > 138   < > 141 139 135 136 138  K 4.0  --    < > 3.6   < > 3.3* 3.3* 3.2* 3.9 4.0  CL 101  --    < > 98  --   --  96* 93* 100 101  CO2 25  --    < > 26  --   --  28 29 27 25   GLUCOSE 163*  --    < > 174*  --   --  178* 238* 352* 210*  BUN 22  --    < > 23  --   --  24* 26* 23 25*  CREATININE 0.86  --    < > 0.81  --   --  0.88 1.09* 1.10* 0.91  CALCIUM  8.8*  --    < > 9.0  --   --  8.4* 8.8* 8.6* 8.8*  MG 1.3* 1.7  --   --    --   --   --  1.8 2.1 2.0  PHOS  --  4.2  --   --   --   --   --   --   --   --    < > = values in this interval not displayed.   Liver Function Tests: Recent Labs  Lab 10/18/23 1033  AST 26  ALT 25  ALKPHOS 85  BILITOT 0.5  PROT 6.9  ALBUMIN  3.3*   No results for input(s): LIPASE, AMYLASE in the last 168 hours. No results for input(s): AMMONIA in the last 168 hours. CBC: Recent Labs  Lab 10/18/23 1033 10/20/23 0852 10/20/23 1337 10/20/23 1341 10/20/23 1355 10/21/23 0454 10/22/23 0453 10/23/23 0325  WBC 6.6 6.8  --   --   --  5.2 6.2 6.7  NEUTROABS 4.6  --   --   --   --   --   --   --   HGB 13.0 13.8   < > 13.3 13.3 12.9 13.7 14.4  HCT 41.7 43.4   < > 39.0 39.0 40.0 42.4 44.6  MCV 93.7 91.2  --   --   --  90.7 89.8 89.6  PLT 228 201  --   --   --  209 239 257   < > = values in this interval not displayed.   Cardiac Enzymes: No results for input(s): CKTOTAL, CKMB, CKMBINDEX, TROPONINI in the last 168 hours. BNP: BNP (last 3 results) Recent Labs    10/18/23 1033 10/19/23 0408 10/21/23 0454  BNP 373.0* 545.0* 289.0*    ProBNP (last 3 results) No results for input(s): PROBNP in the last 8760 hours.  CBG: Recent Labs  Lab 10/23/23 1159 10/23/23 1615 10/23/23 2055 10/24/23 0742 10/24/23 1100  GLUCAP 184* 228* 314* 172* 174*       Signed:  Sigurd Pac MD.  [facility] 10/24/2023, 3:14 PM

## 2023-10-24 NOTE — Telephone Encounter (Signed)
 Pharmacy Patient Advocate Encounter   Received notification from Inpatient Request that prior authorization for Sildenafil  Citrate (PAH) 20MG  tablets is required/requested.   Insurance verification completed.   The patient is insured through [employer] [facility] .   Per test claim: PA required; PA submitted to above mentioned insurance via Latent Key/confirmation #/EOC [id number] Status is pending

## 2023-10-25 ENCOUNTER — Other Ambulatory Visit: Payer: Self-pay

## 2023-10-25 ENCOUNTER — Telehealth (HOSPITAL_COMMUNITY): Payer: Self-pay | Admitting: Cardiology

## 2023-10-25 ENCOUNTER — Telehealth: Payer: Self-pay

## 2023-10-25 DIAGNOSIS — I6523 Occlusion and stenosis of bilateral carotid arteries: Secondary | ICD-10-CM

## 2023-10-25 LAB — PROTEIN C, TOTAL: Protein C, Total: 88 % (ref 60–150)

## 2023-10-25 NOTE — Telephone Encounter (Signed)
 Pt aware and letter written

## 2023-10-25 NOTE — Patient Instructions (Signed)
 Visit Information  Thank you for taking time to visit with me today. Please don't hesitate to contact me if I can be of assistance to you before our next scheduled telephone appointment.  Our next appointment is by telephone on 11/01/23 at 3:00 PM  Following is a copy of your care plan:   Goals Addressed             This Visit's Progress    VBCI Transitions of Care (TOC) Care Plan       Problems:  Recent Hospitalization for treatment of CHF and DMII No Hospital Follow Up Provider appointment Follow up for November 07, 2023 at 11:00 and Metformin  1000 mg twice a day was not on AVS (Discharge papers)  Goal:  Over the next 30 days, the patient will not experience hospital readmission  Interventions:   Heart Failure Interventions: Reviewed Heart Failure Action Plan in depth and provided written copy Reviewed role of diuretics in prevention of fluid overload and management of heart failure; Discussed the importance of keeping all appointments with provider SPECIAL INSTRUCTIONS FOR PATIENTS WITH HEART FAILURE: Continue to follow the instructions in your Heart Failure Patient Education information that you received during your hospital stay. Record daily weight on same scale at same time of day. If your doctor did not discuss your diet or activity in the information above, please follow a Heart Healthy Low Sodium diet and increase your activity as you feel able. Call your doctor: (Anytime you feel any of the following symptoms) 3-4 pound weight gain in 1-2 days or 2 pounds overnight Shortness of breath, with or without a dry hacking cough Swelling in the hands, feet or stomach If you have to sleep on extra pillows at night in order to breathe  Diabetes Interventions: Assessed patient's understanding of A1c goal: <7% Reviewed medications with patient and discussed importance of medication adherence Discussed plans with patient for ongoing care management follow up and provided  patient with direct contact information for care management team Reached out to PCP regarding Metformin  for post hospital Lab Results  Component Value Date   HGBA1C 7.3 (H) 10/19/2023    Patient Self Care Activities:  Attend all scheduled provider appointments Call pharmacy for medication refills 3-7 days in advance of running out of medications Call provider office for new concerns or questions  Notify RN Care Manager of TOC call rescheduling needs Participate in Transition of Care Program/Attend TOC scheduled calls Take medications as prescribed    Plan:  The care management team will reach out to the patient again over the next 7- 10 business days. The patient has been provided with contact information for the care management team and has been advised to call with any health related questions or concerns.         Patient verbalizes understanding of instructions and care plan provided today and agrees to view in MyChart. Active MyChart status and patient understanding of how to access instructions and care plan via MyChart confirmed with patient.     Telephone follow up appointment with care management team member scheduled for: The patient has been provided with contact information for the care management team and has been advised to call with any health related questions or concerns.  Follow up with provider re: Metformin , contacted Dr. Tobie by in basket and updated Medication Review list with patient information not listed to clarify for ongoing Metformin  1000 mg twice a day.  Please call the care guide team at [phone] if you  need to cancel or reschedule your appointment.   Please call the [place]  National Suicide Prevention Lifeline: 860-738-0221 or TTY: 251-199-6215 TTY (906)139-7398) to talk to a trained counselor if you are experiencing a Mental Health or Behavioral Health Crisis or need someone to talk to.  Richerd Fish, RN, BSN, CCM [facility], [facility] RN Care Manager Direct Dial: [phone]

## 2023-10-25 NOTE — Transitions of Care (Post Inpatient/ED Visit) (Addendum)
 10/25/2023  Name: [name] MRN: [id number] DOB: 22-Mar-1955  Today's TOC FU Call Status: Today's TOC FU Call Status:: Successful TOC FU Call Completed TOC FU Call Complete Date: 10/25/23 Patient's Name and Date of Birth confirmed.  Transition Care Management Follow-up Telephone Call Date of Discharge: 10/24/23 Discharge Facility: [name]) Type of Discharge: Inpatient Admission Primary Inpatient Discharge Diagnosis:: Acute on Chronic Respiratory Failure with Hypoxia How have you been since you were released from the hospital?: Better Any questions or concerns?: Yes Patient Questions/Concerns:: Metformin  was not on the list Patient Questions/Concerns Addressed: Notified Provider of Patient Questions/Concerns  Items Reviewed: Did you receive and understand the discharge instructions provided?: Yes Medications obtained,verified, and reconciled?: Yes (Medications Reviewed) Any new allergies since your discharge?: No Dietary orders reviewed?: Yes Type of Diet Ordered:: Heart Healthy low sodium Do you have support at home?: Yes People in Home [RPT]: alone Name of Support/Comfort Primary Source: Melissa  Medications Reviewed Today: Medications Reviewed Today     Reviewed by Eilleen Richerd GRADE, RN (Registered Nurse) on 10/25/23 at 1157  Med List Status: <None>   Medication Order Taking? Sig Documenting Provider Last Dose Status Informant  amLODipine (NORVASC) 5 MG tablet [id number] Yes Take 1 tablet (5 mg total) by mouth daily. Fairy Frames, MD  Active   apixaban (ELIQUIS) 5 MG TABS tablet [id number] Yes Take 1 tablet (5 mg total) by mouth 2 (two) times daily. Fairy Frames, MD  Active   atorvastatin  (LIPITOR ) 40 MG tablet [id number] Yes TAKE 1 TABLET (40 MG TOTAL) BY MOUTH EVERY OTHER DAY. TAKE AT JOAQUIN Blanch, Suzzane POUR, MD  Active Self, Pharmacy Records  budesonide -formoterol  (BREYNA ) 80-4.5 MCG/ACT inhaler [id number] Yes Inhale 2 puffs into the lungs 2 (two) times daily.  [provider]  Active Self, Pharmacy Records  Continuous Glucose Sensor (FREESTYLE LIBRE 3 PLUS SENSOR) [place] [id number] Yes Change sensor every 15 days. Blanch Suzzane POUR, MD  Active Self, Pharmacy Records  escitalopram  (LEXAPRO ) 20 MG tablet [id number] Yes TAKE 1 TABLET (20 MG TOTAL) BY MOUTH DAILY AT 6 (SIX) AM. Blanch Suzzane POUR, MD  Active Self, Pharmacy Records  FARXIGA  10 MG TABS tablet [id number] Yes TAKE 1 TABLET BY MOUTH EVERY DAY Patel, Rutwik K, MD  Active Self, Pharmacy Records  metFORMIN  (GLUCOPHAGE ) 1000 MG tablet [id number] Yes Take 1,000 mg by mouth 2 (two) times daily with a meal. [provider]  Active            Med Note LESLY, Hina Gupta Y   Tue Oct 25, 2023 11:56 AM) *This medication was per patient and not on her current medication list from After Visit Summary, reach out to PCP for clarification as well today, if to continue*  nystatin  cream (MYCOSTATIN ) [id number]  Apply 1 Application topically 2 (two) times daily. Antonetta Rollene BRAVO, MD  Active Self, Pharmacy Records           Med Note Bellflower, RICHERD GRADE Debar Oct 25, 2023 10:55 AM) As needed  sildenafil  (REVATIO ) 20 MG tablet [id number] Yes Take 1 tablet (20 mg total) by mouth 3 (three) times daily. Fairy Frames, MD  Active   torsemide [facility]) 20 MG tablet [id number] Yes Take 1 tablet (20 mg total) by mouth daily. Fairy Frames, MD  Active             Home Care and Equipment/Supplies: Were Home Health Services Ordered?: No Any new equipment or medical supplies ordered?: No  Functional Questionnaire: Do you need assistance  with bathing/showering or dressing?: No Do you need assistance with meal preparation?: No Do you need assistance with eating?: No Do you have difficulty maintaining continence: No Do you need assistance with getting out of bed/getting out of a chair/moving?: No Do you have difficulty managing or taking your medications?: No  Follow up appointments reviewed: PCP Follow-up  appointment confirmed?: No (Assisted with appointment for 11/07/23 11:00 AM) Specialist Hospital Follow-up appointment confirmed?: Yes Date of Specialist follow-up appointment?: 11/01/23 Follow-Up Specialty Provider:: [facility] Do you need transportation to your follow-up appointment?: No Do you understand care options if your condition(s) worsen?: Yes-patient verbalized understanding  12:21 PM Patient had concerns for wanting to return to work and needed a note to work from home but needed specific restrictions.  Contacted the nurse line for [facility] and left a message with details and request to contact patient for return to work information.  SDOH Interventions Today    Flowsheet Row Most Recent Value  SDOH Interventions   Food Insecurity Interventions Intervention Not Indicated  Housing Interventions Intervention Not Indicated  Transportation Interventions Intervention Not Indicated  Utilities Interventions Intervention Not Indicated    Goals Addressed             This Visit's Progress    VBCI Transitions of Care (TOC) Care Plan       Problems:  Recent Hospitalization for treatment of CHF and DMII No Hospital Follow Up Provider appointment Follow up for November 07, 2023 at 11:00 and Metformin  1000 mg twice a day was not on AVS (Discharge papers)  Goal:  Over the next 30 days, the patient will not experience hospital readmission  Interventions:   Heart Failure Interventions: Reviewed Heart Failure Action Plan in depth and provided written copy Reviewed role of diuretics in prevention of fluid overload and management of heart failure; Discussed the importance of keeping all appointments with provider SPECIAL INSTRUCTIONS FOR PATIENTS WITH HEART FAILURE: Continue to follow the instructions in your Heart Failure Patient Education information that you received during your hospital stay. Record daily weight on same scale at same time of  day. If your doctor did not discuss your diet or activity in the information above, please follow a Heart Healthy Low Sodium diet and increase your activity as you feel able. Call your doctor: (Anytime you feel any of the following symptoms) 3-4 pound weight gain in 1-2 days or 2 pounds overnight Shortness of breath, with or without a dry hacking cough Swelling in the hands, feet or stomach If you have to sleep on extra pillows at night in order to breathe  Diabetes Interventions: Assessed patient's understanding of A1c goal: <7% Reviewed medications with patient and discussed importance of medication adherence Discussed plans with patient for ongoing care management follow up and provided patient with direct contact information for care management team Reached out to PCP regarding Metformin  for post hospital Lab Results  Component Value Date   HGBA1C 7.3 (H) 10/19/2023    Patient Self Care Activities:  Attend all scheduled provider appointments Call pharmacy for medication refills 3-7 days in advance of running out of medications Call provider office for new concerns or questions  Notify RN Care Manager of TOC call rescheduling needs Participate in Transition of Care Program/Attend TOC scheduled calls Take medications as prescribed    Plan:  The care management team will reach out to the patient again over the next 7- 10 business days. The patient has been provided with contact  information for the care management team and has been advised to call with any health related questions or concerns.        *In basket message sent to Dr. Suzzane Blanch regarding patient's Metformin  1000 mg twice a day that was not initially on hospital AVS to clarify and confirm. Late entry 9:30 1:06 PM left voicemail for patient to hold Metformin  til PCP appointment  Richerd Fish, RN, BSN, CCM [place]  [facility], [facility] RN Care Manager Direct Dial: [phone]

## 2023-10-25 NOTE — Telephone Encounter (Signed)
 Victoria,RN with [facility] Called on behalf of patient to request letter to return to work. Reports work is remote  Letter would have to detail restrictions if any HFU 10/7   AHF to address or general cardiology? Please advise

## 2023-10-25 NOTE — Addendum Note (Signed)
 Addended by: EILLEEN RICHERD GRADE on: 10/25/2023 01:53 PM   Modules accepted: Orders

## 2023-10-26 ENCOUNTER — Other Ambulatory Visit: Payer: Self-pay | Admitting: Internal Medicine

## 2023-10-26 DIAGNOSIS — I5031 Acute diastolic (congestive) heart failure: Secondary | ICD-10-CM

## 2023-10-27 ENCOUNTER — Ambulatory Visit: Payer: Self-pay | Admitting: Physician Assistant

## 2023-10-27 ENCOUNTER — Ambulatory Visit: Payer: Self-pay | Admitting: Cardiology

## 2023-10-27 DIAGNOSIS — Z87898 Personal history of other specified conditions: Secondary | ICD-10-CM

## 2023-10-27 LAB — DRVVT CONFIRM: dRVVT Confirm: 1 ratio (ref 0.8–1.2)

## 2023-10-27 LAB — PROTEIN C ACTIVITY: Protein C Activity: 81 % (ref 73–180)

## 2023-10-27 LAB — LUPUS ANTICOAGULANT PANEL
DRVVT: 54.2 s — ABNORMAL HIGH (ref 0.0–47.0)
PTT Lupus Anticoagulant: 36.5 s (ref 0.0–43.5)

## 2023-10-27 LAB — FACTOR 5 LEIDEN

## 2023-10-27 LAB — PROTEIN S ACTIVITY: Protein S Activity: 104 % (ref 63–140)

## 2023-10-27 LAB — HOMOCYSTEINE: Homocysteine: 26 umol/L — ABNORMAL HIGH (ref 0.0–17.2)

## 2023-10-27 LAB — PROTHROMBIN GENE MUTATION

## 2023-10-27 LAB — PROTEIN S, TOTAL: Protein S Ag, Total: 100 % (ref 60–150)

## 2023-10-27 LAB — DRVVT MIX: dRVVT Mix: 41.3 s — ABNORMAL HIGH (ref 0.0–40.4)

## 2023-10-28 ENCOUNTER — Ambulatory Visit (HOSPITAL_COMMUNITY)
Admission: RE | Admit: 2023-10-28 | Discharge: 2023-10-28 | Disposition: A | Discharge status: Home / Self Care | Source: Ambulatory Visit | Attending: Physician Assistant | Admitting: Physician Assistant

## 2023-10-28 ENCOUNTER — Ambulatory Visit (HOSPITAL_COMMUNITY)

## 2023-10-28 ENCOUNTER — Other Ambulatory Visit (HOSPITAL_COMMUNITY)

## 2023-10-28 DIAGNOSIS — I6523 Occlusion and stenosis of bilateral carotid arteries: Secondary | ICD-10-CM | POA: Diagnosis not present

## 2023-10-28 DIAGNOSIS — I779 Disorder of arteries and arterioles, unspecified: Secondary | ICD-10-CM | POA: Diagnosis not present

## 2023-10-28 NOTE — Telephone Encounter (Signed)
 [employer] requested clarification of REMS form. Updated and faxed on 10/28/2023

## 2023-10-29 LAB — BETA-2-GLYCOPROTEIN I ABS, IGG/M/A
Beta-2 Glyco I IgG: <9 GPI IgG units (ref 0–20)
Beta-2-Glycoprotein I IgA: <9 GPI IgA units (ref 0–25)
Beta-2-Glycoprotein I IgM: <9 GPI IgM units (ref 0–32)

## 2023-10-30 LAB — CARDIOLIPIN ANTIBODIES, IGG, IGM, IGA
Anticardiolipin IgA: <9 U/mL (ref 0–11)
Anticardiolipin IgG: <9 GPL U/mL (ref 0–14)
Anticardiolipin IgM: <9 [MPL'U]/mL (ref 0–12)

## 2023-10-31 ENCOUNTER — Other Ambulatory Visit (HOSPITAL_COMMUNITY): Payer: Self-pay | Admitting: Pharmacist

## 2023-10-31 ENCOUNTER — Ambulatory Visit: Payer: Self-pay | Admitting: Physician Assistant

## 2023-10-31 MED ORDER — ADEMPAS 0.5 MG PO TABS: 0.5000 mg | ORAL_TABLET | Freq: Three times a day (TID) | ORAL | Status: DC

## 2023-10-31 NOTE — Telephone Encounter (Signed)
 Pt returning call for test results. Please advise.

## 2023-11-01 ENCOUNTER — Other Ambulatory Visit (HOSPITAL_COMMUNITY): Payer: Self-pay

## 2023-11-01 ENCOUNTER — Telehealth: Payer: Self-pay

## 2023-11-01 ENCOUNTER — Encounter (HOSPITAL_COMMUNITY): Payer: Self-pay

## 2023-11-01 ENCOUNTER — Telehealth

## 2023-11-01 ENCOUNTER — Ambulatory Visit (HOSPITAL_COMMUNITY): Payer: Self-pay | Admitting: Physician Assistant

## 2023-11-01 ENCOUNTER — Ambulatory Visit (HOSPITAL_COMMUNITY)
Admit: 2023-11-01 | Discharge: 2023-11-01 | Disposition: A | Discharge status: Home / Self Care | Attending: Physician Assistant | Admitting: Physician Assistant

## 2023-11-01 VITALS — BP 132/68 | HR 95 | Ht 64.0 in | Wt 185.2 lb

## 2023-11-01 DIAGNOSIS — F17210 Nicotine dependence, cigarettes, uncomplicated: Secondary | ICD-10-CM | POA: Insufficient documentation

## 2023-11-01 DIAGNOSIS — I5032 Chronic diastolic (congestive) heart failure: Secondary | ICD-10-CM | POA: Diagnosis not present

## 2023-11-01 DIAGNOSIS — I272 Pulmonary hypertension, unspecified: Secondary | ICD-10-CM | POA: Diagnosis not present

## 2023-11-01 DIAGNOSIS — I2724 Chronic thromboembolic pulmonary hypertension: Secondary | ICD-10-CM | POA: Diagnosis not present

## 2023-11-01 DIAGNOSIS — G4733 Obstructive sleep apnea (adult) (pediatric): Secondary | ICD-10-CM | POA: Diagnosis not present

## 2023-11-01 DIAGNOSIS — B379 Candidiasis, unspecified: Secondary | ICD-10-CM | POA: Insufficient documentation

## 2023-11-01 DIAGNOSIS — Z7901 Long term (current) use of anticoagulants: Secondary | ICD-10-CM | POA: Diagnosis not present

## 2023-11-01 DIAGNOSIS — D6851 Activated protein C resistance: Secondary | ICD-10-CM | POA: Insufficient documentation

## 2023-11-01 DIAGNOSIS — I252 Old myocardial infarction: Secondary | ICD-10-CM | POA: Insufficient documentation

## 2023-11-01 DIAGNOSIS — Z79899 Other long term (current) drug therapy: Secondary | ICD-10-CM | POA: Diagnosis not present

## 2023-11-01 DIAGNOSIS — I110 Hypertensive heart disease with heart failure: Secondary | ICD-10-CM | POA: Insufficient documentation

## 2023-11-01 DIAGNOSIS — Z955 Presence of coronary angioplasty implant and graft: Secondary | ICD-10-CM | POA: Insufficient documentation

## 2023-11-01 DIAGNOSIS — Z7984 Long term (current) use of oral hypoglycemic drugs: Secondary | ICD-10-CM | POA: Insufficient documentation

## 2023-11-01 DIAGNOSIS — B3731 Acute candidiasis of vulva and vagina: Secondary | ICD-10-CM

## 2023-11-01 DIAGNOSIS — E119 Type 2 diabetes mellitus without complications: Secondary | ICD-10-CM | POA: Insufficient documentation

## 2023-11-01 DIAGNOSIS — R0602 Shortness of breath: Secondary | ICD-10-CM | POA: Insufficient documentation

## 2023-11-01 DIAGNOSIS — J45909 Unspecified asthma, uncomplicated: Secondary | ICD-10-CM | POA: Insufficient documentation

## 2023-11-01 DIAGNOSIS — I2510 Atherosclerotic heart disease of native coronary artery without angina pectoris: Secondary | ICD-10-CM | POA: Diagnosis not present

## 2023-11-01 DIAGNOSIS — I6523 Occlusion and stenosis of bilateral carotid arteries: Secondary | ICD-10-CM | POA: Diagnosis not present

## 2023-11-01 LAB — BASIC METABOLIC PANEL WITH GFR
Anion gap: 17 — ABNORMAL HIGH (ref 5–15)
BUN: 25 mg/dL — ABNORMAL HIGH (ref 8–23)
CO2: 25 mmol/L (ref 22–32)
Calcium: 9.8 mg/dL (ref 8.9–10.3)
Chloride: 95 mmol/L — ABNORMAL LOW (ref 98–111)
Creatinine, Ser: 1.09 mg/dL — ABNORMAL HIGH (ref 0.44–1.00)
GFR, Estimated: 56 mL/min — ABNORMAL LOW (ref >60)
Glucose, Bld: 309 mg/dL — ABNORMAL HIGH (ref 70–99)
Potassium: 4.2 mmol/L (ref 3.5–5.1)
Sodium: 137 mmol/L (ref 135–145)

## 2023-11-01 LAB — BRAIN NATRIURETIC PEPTIDE: B Natriuretic Peptide: 27.5 pg/mL (ref 0.0–100.0)

## 2023-11-01 MED ORDER — FLUCONAZOLE 150 MG PO TABS: 150.0000 mg | ORAL_TABLET | Freq: Every day | ORAL | 0 refills | Status: AC

## 2023-11-01 NOTE — Progress Notes (Addendum)
 Chief Complaint:   HPI: [name] is a 68 y.o. female 68 y/o female smoker w/ h/o chronic asthmatic bronchitis followed by Dr. Darlean, OSA on CPAP, Obesity, PH w/ associated RV dysfunction, CAD s/p anterior STEMI w/ DES to LAD in 2016, b/l carotid artery dz.    Echo in 2022 showed normal LVEF 60-65, RV mildly enlarged, mildly reduced SFx, D-shaped interventricular septum, estimated RVSP 57 mmHg.    PFTs 12/24 c/w minimal OAD, + restriction c/w body habitus and moderate diffusion defect.    Sent from pulmonary clinic to the ED in 9/25 given complaint of worsening SOB w/ minimal exertion and a/c hypoxic respiratory failure and numerous near syncopal spells.   Initial O2 sats were in the upper 80s. Had also endorsed recent travel to [facility] prompting PE r/o. D-dimer was elevated at 3.12 but chest CT negative for PE. Study did show dilation of main pulmonary artery c/w underlying PH. However V/Q concerning for chronic PE. 2D Echo w/hyperdynamic LV function, EF 70-755, GIIDD, interventricular septum flattened in systole and diastole consistent with right ventricular pressure and volume overload, RV mildly enlarged w/ mild-moderately reduced SFx, estimated RVSP 77 mmHg, mild-mod TR, IVC dilated  with <50% respiratory variability suggesting right atrial pressure of 15 mmHg. AS only mild, mean gradient 10 mmHg. R/LHC mild, nonobstructive coronary artery disease with patent mid to distal LAD stent. 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 She was diuresed and started on sildenafil . Repeat RHC prior to discharge w/ normal filling pressures.  She is here today for hospital follow-up regarding pulmonary hypertension and diastolic CHF/RV failure. Has been doing well since discharge. No recurrent syncope. Dyspnea significantly improved. Home weight stable around 187 lb. No orthopnea, PND or lower  extremity edema. Back to work part-time working from home. Has not received adempas in mail yet, continues on sildenafil . She is faithful with using CPAP.  Reports symptoms of vaginal yeast infection starting after hospital discharged. Has required diflucan  in the past but did not need interruption in SGLT2i.  Down to 3 cigarettes daily. Cutting back slowly. No ETOH.  ROS: All systems negative except as listed in HPI, PMH and Problem List.  SH:  Social History   Socioeconomic History   Marital status: Widowed    Spouse name: Not on file   Number of children: Not on file   Years of education: Not on file   Highest education level: Associate degree: occupational, Scientist, product/process development, or vocational program  Occupational History   Occupation: Office work for [employer]  Tobacco Use   Smoking status: Every Day    Current packs/day: 1.00    Average packs/day: 1 pack/day for 50.8 years (50.8 ttl pk-yrs)    Types: Cigarettes    Start date: 12/26/1972   Smokeless tobacco: Never   Tobacco comments:    3 cigs per day currently 10/18/23   Vaping Use   Vaping status: Never Used  Substance and Sexual Activity   Alcohol  use: No    Alcohol /week: 0.0 standard drinks of alcohol    Drug use: No   Sexual activity: Yes    Partners: Male    Birth control/protection: Surgical  Other Topics Concern   Not on file  Social History Narrative   Not on file   Social Drivers of Health   Financial Resource Strain: Low Risk  (08/06/2023)  Overall Financial Resource Strain (CARDIA)    Difficulty of Paying Living Expenses: Not hard at all  Food Insecurity: No Food Insecurity (10/25/2023)   Hunger Vital Sign    Worried About Running Out of Food in the Last Year: Never true    Ran Out of Food in the Last Year: Never true  Transportation Needs: No Transportation Needs (10/25/2023)   PRAPARE - Administrator, Civil Service (Medical): No    Lack of Transportation (Non-Medical): No  Physical  Activity: Insufficiently Active (08/06/2023)   Exercise Vital Sign    Days of Exercise per Week: 2 days    Minutes of Exercise per Session: 30 min  Stress: No Stress Concern Present (08/06/2023)   [employer] of Occupational Health - Occupational Stress Questionnaire    Feeling of Stress: Not at all  Social Connections: Moderately Integrated (08/06/2023)   Social Connection and Isolation Panel    Frequency of Communication with Friends and Family: More than three times a week    Frequency of Social Gatherings with Friends and Family: More than three times a week    Attends Religious Services: More than 4 times per year    Active Member of [employer] or Organizations: Yes    Attends Banker Meetings: More than 4 times per year    Marital Status: Widowed  Intimate Partner Violence: Not At Risk (10/25/2023)   Humiliation, Afraid, Rape, and Kick questionnaire    Fear of Current or Ex-Partner: No    Emotionally Abused: No    Physically Abused: No    Sexually Abused: No    FH:  Family History  Problem Relation Age of Onset   Heart attack Father    Heart disease Father    Cancer Mother    Heart disease Mother    Cancer Brother    Cancer Maternal Grandfather    Cancer Maternal Grandmother    Cancer Maternal Aunt    Cancer Maternal Aunt    Cancer Maternal Aunt    Cancer Maternal Aunt     Past Medical History:  Diagnosis Date   Allergy    Codeine, Sulfur   Anxiety    Aortic stenosis    CAD (coronary artery disease)    a. 10/2014: Ant STEMI s/p DES to dLAD   Carotid disease, bilateral    a. Duplex 12/2014: mild-mod atherosclerotic plaque without hemodynamically significant stenosis.   Cataract 04/08/2016   CHF (congestive heart failure) (HCC) 08/27/2018   COPD (chronic obstructive pulmonary disease) (HCC)    Essential hypertension    Heart murmur 11/17/2014   HLD (hyperlipidemia)    Morbid obesity (HCC)    Myocardial infarction (HCC) 11/17/2014   Oxygen  deficiency  1996   1 LITER AT BEDTIME WITH BIPAP   PONV (postoperative nausea and vomiting)    Shingles    Sleep apnea    Uses CPAP   Type 2 diabetes mellitus (HCC)     Current Outpatient Medications  Medication Sig Dispense Refill   amLODipine (NORVASC) 5 MG tablet Take 1 tablet (5 mg total) by mouth daily. 30 tablet 0   apixaban (ELIQUIS) 5 MG TABS tablet Take 1 tablet (5 mg total) by mouth 2 (two) times daily. 60 tablet 1   atorvastatin  (LIPITOR ) 40 MG tablet TAKE 1 TABLET (40 MG TOTAL) BY MOUTH EVERY OTHER DAY. TAKE AT DINNER 45 tablet 1   budesonide -formoterol  (BREYNA ) 80-4.5 MCG/ACT inhaler Inhale 2 puffs into the lungs 2 (two) times daily.  Continuous Glucose Sensor (FREESTYLE LIBRE 3 PLUS SENSOR) MISC Change sensor every 15 days. 6 each 3   escitalopram  (LEXAPRO ) 20 MG tablet TAKE 1 TABLET (20 MG TOTAL) BY MOUTH DAILY AT 6 (SIX) AM. 90 tablet 1   FARXIGA  10 MG TABS tablet TAKE 1 TABLET BY MOUTH EVERY DAY 90 tablet 0   fluconazole  (DIFLUCAN ) 150 MG tablet Take 1 tablet (150 mg total) by mouth daily for 3 days. 3 tablet 0   nystatin  cream (MYCOSTATIN ) Apply 1 Application topically 2 (two) times daily. 30 g 0   Riociguat (ADEMPAS) 0.5 MG TABS Take 0.5 mg by mouth 3 (three) times daily.     torsemide (DEMADEX) 20 MG tablet Take 1 tablet (20 mg total) by mouth daily. 60 tablet 0   metFORMIN  (GLUCOPHAGE ) 1000 MG tablet Take 1,000 mg by mouth 2 (two) times daily with a meal. 10/25/23 This Medication is on pause and confirmed by PCP - Dr. Suzzane Blanch and will discuss at hospital follow up appointment. (Patient not taking: Reported on 10/25/2023)     No current facility-administered medications for this encounter.    Vitals:   11/01/23 1127  BP: 132/68  Pulse: 95  SpO2: 93%  Weight: 84 kg (185 lb 3.2 oz)  Height: 5' 4 (1.626 m)    PHYSICAL EXAM:  General:  Well appearing. Ambulated into clinic Cor: No JVD. Regular rate & rhythm. No murmurs. Lungs: diminished Abdomen: soft, nontender,  nondistended.  Extremities: no edema Neuro: alert & orientedx3. Affect pleasant.   ASSESSMENT & PLAN: 1. Pulmonary hypertension: Patient has severe pulmonary hypertension with evidence for RV dysfunction by echo.  Echo showed EF 70-75% with D-shaped interventricular septum, mild RV enlargement with mild-moderate RV dysfunction, PASP 77 mmHg, dilated IVC, mild aortic stenosis.   RHC showed elevated right and left heart filling pressures with mean PAP 64 mmHg, PVR 10 WU.  This appears to be combined group 1 and 2 PH, primarily group 1.  CTA chest with no significant lung parenchymal disease and no acute PE.  Prior pulmonary workup revealed OSA and mild asthma.  Patient is a prior smoker.  She is on CPAP at night.  She is not hypoxemic at rest. Do not think that OSA can explain the extent of her PH.  CI was low on initial RHC, 1.7 by Fick and 1.9 by thermodilution.  This is an ominous finding.  Her episodes of presyncope are also concerning.  V/Q scan concerning for chronic PE, reviewed with pulmonary.  HRCT w/ moasic ground-glass attenuation in expiratory phase suggestive of air trapping which can be seen w/ small airway disease, scattered pulmonary micronodules. Bubble study suggestive of PFO. HIV, ANA, SCL-70, anti-centromere Ab negative.  Repeat RHC after diuresis showed normally filling pressures, severe PAH, and CI improved 2.72.  No step-up in oxygen  saturation from SVC to PA (no significant shunt lesion).   - Follow-up with Pulmonary  - She is on sildenafil  20 mg tid.  Approved for Adempas, has been shipped. Once arrives, she wills top sildenafil  then start Adempas. Titration per HF PharmD. - Discussed with Dr. Rolan, given findings on V/Q scan, will refer to [facility] CTEPH program.  2. Chronic diastolic CHF: Elevated PCWP in setting of normal EF on initial RHC.  She has diuresed well, repeat RHC 9/25 shows normal filling pressures.  - Continue Torsemide 20 mg daily - Continue Farxiga  10 mg daily.   -  Labs today 3. CAD: H/o anterior MI.  LHC today with patent  LAD stent, nonobstructive disease.  - Stopped ASA with apixaban use (see below).  - Continue statin.  - Follows with Cardiology 4. OSA: Continue CPAP.  5. Active smoker: Prior PFTs not consistent with COPD and emphysema not noted on CTA chest.  - Discussed smoking cessation. Cutting back slowly 6. Type 2 diabetes: per primary. - Last A1c 7.3%  7. Carotid stenosis: 50-69% R ICA stenosis and ~ 50% left ICA stenosis on carotid duplex 10/25.  - This will need to be followed 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 Reviewed with pulmonary.  She has no h/o clots.  Venous dopplers negative for DVT. 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  9. HTN: Continue amlodipine 5 mg daily - BP above goal today. She reports BP is typically much better controlled than this. Instructed her to call if BP consistently above 120/80 10. Yeast infection: Start diflucan  150 mg X 3 days - Will need to watch with Farxiga , reports she has not needed to interrupt SGLT2i with prior yeast infections  Provided work note to continue light duty/work from home (up to 4 hrs/day) until 11/21/23.  Follow up 4-6 weeks with Dr. Rolan

## 2023-11-01 NOTE — Patient Instructions (Signed)
 Medication Changes:  FOR 3 DAYS ONLY take Diflucan  150mg  (1 tab) daily for 3 days only  Lab Work:  Labs done today, your results will be available in MyChart, we will contact you for abnormal readings.   Referrals:  You have been referred to [facility]. They will contact you in order to schedule an appointment.   Follow-Up in: Please follow up with the [facility] in 4-6 weeks.   At the [facility], you and your health needs are our priority. We have a designated team specialized in the treatment of Heart Failure. This Care Team includes your primary Heart Failure Specialized Cardiologist (physician), Advanced Practice Providers (APPs- Physician Assistants and Nurse Practitioners), and Pharmacist who all work together to provide you with the care you need, when you need it.   You may see any of the following providers on your designated Care Team at your next follow up:  Dr. Toribio Fuel Dr. Ezra Shuck Dr. Ria Commander Dr. Odis Brownie Greig Mosses, NP Caffie Shed, [place] [facility], [place] Beckey Coe, NP [place] Lee, NP Tinnie Redman, PharmD   Please be sure to bring in all your medications bottles to every appointment.   Need to Contact Us :  If you have any questions or concerns before your next appointment please send us  a message through [place] or call our office at [phone].    TO LEAVE A MESSAGE FOR THE NURSE SELECT OPTION 2, PLEASE LEAVE A MESSAGE INCLUDING: YOUR NAME DATE OF BIRTH CALL BACK NUMBER REASON FOR CALL**this is important as we prioritize the call backs  YOU WILL RECEIVE A CALL BACK THE SAME DAY AS LONG AS YOU CALL BEFORE 4:00 PM

## 2023-11-02 ENCOUNTER — Other Ambulatory Visit (HOSPITAL_COMMUNITY): Payer: Self-pay

## 2023-11-02 ENCOUNTER — Telehealth: Payer: Self-pay

## 2023-11-02 NOTE — Telephone Encounter (Signed)
 Spoke to [facility] who confirmed $0 copay and prescription is being processed for patient.

## 2023-11-03 ENCOUNTER — Telehealth: Payer: Self-pay

## 2023-11-03 NOTE — Transitions of Care (Post Inpatient/ED Visit) (Signed)
 Care Management  Transitions of Care Program Transitions of Care Post-discharge week 2  11/03/2023 Name: [name] MRN: [id number] DOB: July 28, 1955  Subjective: [name] is a 68 y.o. year old female who is a primary care patient of [name], MD. The Care Management team was unable to reach the patient by phone to assess and address transitions of care needs. Unsuccessful follow up attempt x 3.  Plan: No further outreach attempts will be made at this time.  We have been unable to reach the patient.  Left a HIPAA approved voicemail message to phone number provided in demographics per DPR.    Richerd Fish, RN, BSN, CCM [facility], [facility] RN Care Manager Direct Dial: [phone]

## 2023-11-07 ENCOUNTER — Encounter: Payer: Self-pay | Admitting: Internal Medicine

## 2023-11-07 ENCOUNTER — Ambulatory Visit (INDEPENDENT_AMBULATORY_CARE_PROVIDER_SITE_OTHER): Admitting: Internal Medicine

## 2023-11-07 VITALS — BP 101/66 | HR 90 | Ht 64.0 in | Wt 188.0 lb

## 2023-11-07 DIAGNOSIS — E782 Mixed hyperlipidemia: Secondary | ICD-10-CM

## 2023-11-07 DIAGNOSIS — J4489 Other specified chronic obstructive pulmonary disease: Secondary | ICD-10-CM

## 2023-11-07 DIAGNOSIS — E1165 Type 2 diabetes mellitus with hyperglycemia: Secondary | ICD-10-CM | POA: Diagnosis not present

## 2023-11-07 DIAGNOSIS — I10 Essential (primary) hypertension: Secondary | ICD-10-CM

## 2023-11-07 DIAGNOSIS — Z23 Encounter for immunization: Secondary | ICD-10-CM

## 2023-11-07 DIAGNOSIS — I2721 Secondary pulmonary arterial hypertension: Secondary | ICD-10-CM | POA: Diagnosis not present

## 2023-11-07 DIAGNOSIS — I5031 Acute diastolic (congestive) heart failure: Secondary | ICD-10-CM

## 2023-11-07 DIAGNOSIS — Z7984 Long term (current) use of oral hypoglycemic drugs: Secondary | ICD-10-CM

## 2023-11-07 DIAGNOSIS — R7989 Other specified abnormal findings of blood chemistry: Secondary | ICD-10-CM

## 2023-11-07 MED ORDER — TIRZEPATIDE 5 MG/0.5ML ~~LOC~~ SOAJ: 5.0000 mg | SUBCUTANEOUS | 1 refills | Status: AC

## 2023-11-07 NOTE — Patient Instructions (Addendum)
 Please start taking Mounjaro  5 mg once weekly.  Please start taking Metformin  as prescribed.  Please continue to take other medications as prescribed.  Please continue to follow low carb diet and perform moderate exercise/walking at least 150 mins/week.  Please get fasting blood tests done before the next visit.

## 2023-11-07 NOTE — Progress Notes (Signed)
 Established Patient Office Visit  Subjective:  Patient ID: [name], female    DOB: Jul 18, 1955  Age: 68 y.o. MRN: [id number]  CC:  Chief Complaint  Patient presents with   Medical Management of Chronic Issues    Hospital f/u     HPI [name] is a 68 y.o. female with past medical history of CAD s/p stent placement, HTN, OSA, asthmatic bronchitis, type II DM, GERD, HLD and GAD who presents for f/u of her chronic medical conditions and recent hospitalization from 10/18/23-10/24/23.  She was referred from pulmonology office for the evaluation of acute on chronic hypoxic respiratory failure, increased cough, congestion, shortness of breath, dizziness. On presentation, she was hypoxic and was put on 2 L of oxygen  per minute. COVID/flu/RSV negative. CTA chest negative for PE, showed dilation of main pulmonary artery reflecting underlying pulmonary hypertension. She improved with IV diuresis, and was placed on sildenafil .  She has been referred to [facility] CTEPH program. She is planned to start Adempas.  CAD, HTN: She has history of stent placement for STEMI.  She is on aspirin  and atorvastatin  40 mg QD.  She was on valsartan  40 mg daily, metoprolol  37.5 mg daily and Imdur  30 mg daily, which have been discontinued from recent hospitalization.  She also takes Lasix  20 mg QD due to history of acute CHF and chronic leg swelling.  Denies any headache, dizziness, chest pain, or palpitations.  She uses BiPAP regularly for OSA.  Followed by pulmonology.  Type II DM: Her last HbA1c was 7.3 in 09/25.  She has stopped taking Mounjaro  10 mg QW due to severe nausea and vomiting.  She takes metformin  1000 mg twice daily and Farxiga  10 mg daily.  Denies polyuria or polyphagia.     Past Medical History:  Diagnosis Date   Allergy    Codeine, Sulfur   Anxiety    Aortic stenosis    Asthma 01/26/2023   CAD (coronary artery disease)    a. 10/2014: Ant STEMI s/p DES to dLAD   Carotid disease, bilateral     a. Duplex 12/2014: mild-mod atherosclerotic plaque without hemodynamically significant stenosis.   Cataract 04/08/2016   CHF (congestive heart failure) (HCC) 08/27/2018   COPD (chronic obstructive pulmonary disease) (HCC)    Essential hypertension    Heart murmur 11/17/2014   HLD (hyperlipidemia)    Morbid obesity (HCC)    Myocardial infarction (HCC) 11/17/2014   Oxygen  deficiency 1996   1 LITER AT BEDTIME WITH BIPAP   PONV (postoperative nausea and vomiting)    Shingles    Sleep apnea    Uses CPAP   Type 2 diabetes mellitus [facility])     Past Surgical History:  Procedure Laterality Date   BREAST BIOPSY Left    2016   CARDIAC CATHETERIZATION N/A 11/17/2014   Procedure: Left Heart Cath and Coronary Angiography;  Surgeon: Deatrice DELENA Cage, MD;  Location: [facility] INVASIVE CV LAB;  Service: Cardiovascular;  Laterality: N/A;   CARDIAC CATHETERIZATION N/A 11/17/2014   Procedure: Coronary Stent Intervention;  Surgeon: Deatrice DELENA Cage, MD;  Location: [facility] INVASIVE CV LAB;  Service: Cardiovascular;  Laterality: N/A;  distal lad 2.25x20 promus   CATARACT EXTRACTION W/PHACO Left 09/20/2016   Procedure: CATARACT EXTRACTION PHACO AND INTRAOCULAR LENS PLACEMENT (IOC);  Surgeon: Perley Hamilton, MD;  Location: [facility] ORS;  Service: Ophthalmology;  Laterality: Left;  CDE: 9.27   CATARACT EXTRACTION W/PHACO Right 10/18/2016   Procedure: CATARACT EXTRACTION PHACO AND INTRAOCULAR LENS PLACEMENT (IOC);  Surgeon: Perley Hamilton, MD;  Location: [facility] ORS;  Service: Ophthalmology;  Laterality: Right;  CDE: 6.38   CHOLECYSTECTOMY     COLONOSCOPY WITH PROPOFOL  N/A 02/16/2021   Procedure: COLONOSCOPY WITH PROPOFOL ;  Surgeon: Cindie Carlin POUR, DO;  Location: [facility] ENDO SUITE;  Service: Endoscopy;  Laterality: N/A;  1:30 / ASA 3   EYE SURGERY     POLYPECTOMY  02/16/2021   Procedure: POLYPECTOMY;  Surgeon: Cindie Carlin POUR, DO;  Location: [facility] ENDO SUITE;  Service: Endoscopy;;   RIGHT HEART CATH N/A 10/24/2023   Procedure: RIGHT HEART  CATH;  Surgeon: Rolan Ezra RAMAN, MD;  Location: [facility] INVASIVE CV LAB;  Service: Cardiovascular;  Laterality: N/A;   RIGHT/LEFT HEART CATH AND CORONARY ANGIOGRAPHY N/A 10/20/2023   Procedure: RIGHT/LEFT HEART CATH AND CORONARY ANGIOGRAPHY;  Surgeon: Wendel Lurena POUR, MD;  Location: [facility] INVASIVE CV LAB;  Service: Cardiovascular;  Laterality: N/A;   SPINAL FUSION     cervical; screws and plates.   TOTAL ABDOMINAL HYSTERECTOMY  1980    Family History  Problem Relation Age of Onset   Heart attack Father    Heart disease Father    Cancer Mother    Heart disease Mother    Cancer Brother    Cancer Maternal Grandfather    Cancer Maternal Grandmother    Cancer Maternal Aunt    Cancer Maternal Aunt    Cancer Maternal Aunt    Cancer Maternal Aunt     Social History   Socioeconomic History   Marital status: Widowed    Spouse name: Not on file   Number of children: Not on file   Years of education: Not on file   Highest education level: Associate degree: occupational, Scientist, product/process development, or vocational program  Occupational History   Occupation: Office work for [employer]  Tobacco Use   Smoking status: Every Day    Current packs/day: 1.00    Average packs/day: 1 pack/day for 50.9 years (50.9 ttl pk-yrs)    Types: Cigarettes    Start date: 12/26/1972   Smokeless tobacco: Never   Tobacco comments:    3 cigs per day currently 11/07/23  Vaping Use   Vaping status: Never Used  Substance and Sexual Activity   Alcohol  use: No    Alcohol /week: 0.0 standard drinks of alcohol    Drug use: No   Sexual activity: Yes    Partners: Male    Birth control/protection: Surgical  Other Topics Concern   Not on file  Social History Narrative   Not on file   Social Drivers of Health   Financial Resource Strain: Low Risk  (08/06/2023)   Overall Financial Resource Strain (CARDIA)    Difficulty of Paying Living Expenses: Not hard at all  Food Insecurity: No Food Insecurity (10/25/2023)   Hunger Vital  Sign    Worried About Running Out of Food in the Last Year: Never true    Ran Out of Food in the Last Year: Never true  Transportation Needs: No Transportation Needs (10/25/2023)   PRAPARE - Administrator, Civil Service (Medical): No    Lack of Transportation (Non-Medical): No  Physical Activity: Insufficiently Active (08/06/2023)   Exercise Vital Sign    Days of Exercise per Week: 2 days    Minutes of Exercise per Session: 30 min  Stress: No Stress Concern Present (08/06/2023)   [employer] of Occupational Health - Occupational Stress Questionnaire    Feeling of Stress: Not at all  Social Connections: Moderately Integrated (  08/06/2023)   Social Connection and Isolation Panel    Frequency of Communication with Friends and Family: More than three times a week    Frequency of Social Gatherings with Friends and Family: More than three times a week    Attends Religious Services: More than 4 times per year    Active Member of [employer] or Organizations: Yes    Attends Banker Meetings: More than 4 times per year    Marital Status: Widowed  Intimate Partner Violence: Not At Risk (10/25/2023)   Humiliation, Afraid, Rape, and Kick questionnaire    Fear of Current or Ex-Partner: No    Emotionally Abused: No    Physically Abused: No    Sexually Abused: No    Outpatient Medications Prior to Visit  Medication Sig Dispense Refill   albuterol  (VENTOLIN  HFA) 108 (90 Base) MCG/ACT inhaler Inhale 2 puffs into the lungs every 6 (six) hours as needed for wheezing or shortness of breath.     amLODipine (NORVASC) 5 MG tablet Take 1 tablet (5 mg total) by mouth daily. 30 tablet 0   apixaban (ELIQUIS) 5 MG TABS tablet Take 1 tablet (5 mg total) by mouth 2 (two) times daily. 60 tablet 1   atorvastatin  (LIPITOR ) 40 MG tablet TAKE 1 TABLET (40 MG TOTAL) BY MOUTH EVERY OTHER DAY. TAKE AT DINNER 45 tablet 1   budesonide -formoterol  (BREYNA ) 80-4.5 MCG/ACT inhaler Inhale 2 puffs into the  lungs 2 (two) times daily.     Continuous Glucose Sensor (FREESTYLE LIBRE 3 PLUS SENSOR) MISC Change sensor every 15 days. 6 each 3   escitalopram  (LEXAPRO ) 20 MG tablet TAKE 1 TABLET (20 MG TOTAL) BY MOUTH DAILY AT 6 (SIX) AM. 90 tablet 1   FARXIGA  10 MG TABS tablet TAKE 1 TABLET BY MOUTH EVERY DAY 90 tablet 0   nystatin  cream (MYCOSTATIN ) Apply 1 Application topically 2 (two) times daily. 30 g 0   Riociguat (ADEMPAS) 0.5 MG TABS Take 0.5 mg by mouth 3 (three) times daily. (Patient not taking: Reported on 11/10/2023)     torsemide (DEMADEX) 20 MG tablet Take 1 tablet (20 mg total) by mouth daily. 60 tablet 0   metFORMIN  (GLUCOPHAGE ) 1000 MG tablet Take 1,000 mg by mouth 2 (two) times daily with a meal. 10/25/23 This Medication is on pause and confirmed by PCP - Dr. Suzzane Blanch and will discuss at hospital follow up appointment.     No facility-administered medications prior to visit.    Allergies  Allergen Reactions   Codeine Nausea And Vomiting   Morphine Nausea And Vomiting   Sulfa Antibiotics Rash    ROS Review of Systems  Constitutional:  Negative for chills and fever.  HENT:  Negative for congestion, sinus pressure, sinus pain and sore throat.   Eyes:  Negative for pain and discharge.  Respiratory:  Positive for cough and shortness of breath (Intermittent).   Cardiovascular:  Negative for chest pain and palpitations.  Gastrointestinal:  Negative for abdominal pain, diarrhea, nausea and vomiting.  Endocrine: Negative for polydipsia and polyuria.  Genitourinary:  Negative for dysuria and hematuria.  Musculoskeletal:  Negative for neck pain and neck stiffness.  Skin:  Negative for rash.  Neurological:  Negative for dizziness and weakness.  Psychiatric/Behavioral:  Negative for agitation and behavioral problems.       Objective:    Physical Exam Vitals reviewed.  Constitutional:      General: She is not in acute distress.    Appearance: She is obese.  She is not  diaphoretic.  HENT:     Head: Normocephalic and atraumatic.     Nose: Nose normal. No congestion.     Mouth/Throat:     Mouth: Mucous membranes are moist.     Pharynx: No posterior oropharyngeal erythema.  Eyes:     General: No scleral icterus.    Extraocular Movements: Extraocular movements intact.  Cardiovascular:     Rate and Rhythm: Normal rate and regular rhythm.     Heart sounds: Normal heart sounds. No murmur heard. Pulmonary:     Breath sounds: No wheezing or rales.  Musculoskeletal:     Cervical back: Neck supple. No tenderness.     Right lower leg: No edema.     Left lower leg: No edema.  Skin:    General: Skin is warm.     Findings: No rash.  Neurological:     General: No focal deficit present.     Mental Status: She is alert and oriented to person, place, and time.  Psychiatric:        Mood and Affect: Mood normal.        Behavior: Behavior normal.     BP 101/66   Pulse 90   Ht 5' 4 (1.626 m)   Wt 188 lb (85.3 kg)   SpO2 93%   BMI 32.27 kg/m  Wt Readings from Last 3 Encounters:  11/10/23 188 lb 3.2 oz (85.4 kg)  11/07/23 188 lb (85.3 kg)  11/01/23 185 lb 3.2 oz (84 kg)    Lab Results  Component Value Date   TSH 4.819 (H) 10/22/2023   Lab Results  Component Value Date   WBC 6.7 10/23/2023   HGB 16.0 (H) 10/24/2023   HCT 47.0 (H) 10/24/2023   MCV 89.6 10/23/2023   PLT 257 10/23/2023   Lab Results  Component Value Date   NA 137 11/01/2023   K 4.2 11/01/2023   CO2 25 11/01/2023   GLUCOSE 309 (H) 11/01/2023   BUN 25 (H) 11/01/2023   CREATININE 1.09 (H) 11/01/2023   BILITOT 0.5 10/18/2023   ALKPHOS 85 10/18/2023   AST 26 10/18/2023   ALT 25 10/18/2023   PROT 6.9 10/18/2023   ALBUMIN  3.3 (L) 10/18/2023   CALCIUM  9.8 11/01/2023   ANIONGAP 17 (H) 11/01/2023   EGFR 54 (L) 08/10/2023   Lab Results  Component Value Date   CHOL 81 10/18/2023   Lab Results  Component Value Date   HDL 32 (L) 10/18/2023   Lab Results  Component Value  Date   LDLCALC 29 10/18/2023   Lab Results  Component Value Date   TRIG 100 10/18/2023   Lab Results  Component Value Date   CHOLHDL 2.5 10/18/2023   Lab Results  Component Value Date   HGBA1C 7.3 (H) 10/19/2023      Assessment & Plan:   Problem List Items Addressed This Visit       Cardiovascular and Mediastinum   Essential hypertension - Primary   BP Readings from Last 1 Encounters:  11/10/23 115/67   Well-controlled with torsemide 20 mg QD Was on valsartan  40 mg QD, metoprolol  37.5 mg QD and Imdur  30 mg QD - recently discontinued due to tightly controlled BP during hospitalization Counseled for compliance with the medications Advised DASH diet and moderate exercise/walking, at least 150 mins/week       Relevant Orders   CBC with Differential/Platelet   Acute diastolic CHF (congestive heart failure) (HCC)   Recent hospital chart reviewed  On torsemide 20 mg QD On Farxiga  10 mg QD Was on ARB, metoprolol  and Imdur  - discontinued during recent hospitalization due to low BP Appears euvolemic currently Followed by CHF clinic now      Pulmonary artery hypertension (HCC)   [facility] (09/25) noted elevated right and left heart pressures mean PAP 64, PVR 10 CTA chest noted no significant parenchymal lung disease, she is a former smoker, VQ scan was concerning for chronic PE, bubble study suggestive of PFO, serologies negative - Improved with diuresis, continued on sildenafil  - plan to switch to Adempas - Dr. Rolan recommended outpatient follow-up for titration of pulmonary vasodilators and referral to [facility] CTEPH program        Respiratory   Asthmatic bronchitis , chronic (HCC)   Has chronic cough Followed by pulmonology, has Breyna  as maintenance inhaler Has albuterol  as needed for dyspnea wheezing      Relevant Medications   albuterol  (VENTOLIN  HFA) 108 (90 Base) MCG/ACT inhaler   Other Relevant Orders   CMP14+EGFR   CBC with Differential/Platelet     Endocrine    Type 2 diabetes mellitus with hyperglycemia (HCC)   Lab Results  Component Value Date   HGBA1C 7.3 (H) 10/19/2023   Uncontrolled Continue Metformin  1000 mg BID Reduced dose of Mounjaro  to 5 mg qw as she had stopped taking it due to GI side effects from higher doses On Farxiga  10 mg QD Advised to follow diabetic diet On statin F/u CMP and lipid panel Diabetic eye exam: Advised to follow up with Ophthalmology for diabetic eye exam      Relevant Medications   tirzepatide  (MOUNJARO ) 5 MG/0.5ML Pen   Other Relevant Orders   CMP14+EGFR   Hemoglobin A1c     Other   Mixed hyperlipidemia   On atorvastatin  40 mg QD      Relevant Orders   Lipid Profile   Elevated TSH   Lab Results  Component Value Date   TSH 4.819 (H) 10/22/2023   Borderline elevated TSH likely due to recent stressor (CHF exacerbation) Recheck TSH and free T4      Relevant Orders   TSH + free T4   Other Visit Diagnoses       Encounter for immunization       Relevant Orders   Flu vaccine HIGH DOSE PF(Fluzone Trivalent) (Completed)        Meds ordered this encounter  Medications   tirzepatide  (MOUNJARO ) 5 MG/0.5ML Pen    Sig: Inject 5 mg into the skin once a week.    Dispense:  6 mL    Refill:  1    Follow-up: Return in about 3 months (around 02/07/2024).    Suzzane MARLA Blanch, MD

## 2023-11-10 ENCOUNTER — Ambulatory Visit: Admitting: Internal Medicine

## 2023-11-10 ENCOUNTER — Encounter: Payer: Self-pay | Admitting: Internal Medicine

## 2023-11-10 VITALS — BP 115/67 | HR 94 | Ht 64.0 in | Wt 188.2 lb

## 2023-11-10 DIAGNOSIS — J4489 Other specified chronic obstructive pulmonary disease: Secondary | ICD-10-CM | POA: Diagnosis not present

## 2023-11-10 DIAGNOSIS — F17210 Nicotine dependence, cigarettes, uncomplicated: Secondary | ICD-10-CM

## 2023-11-10 DIAGNOSIS — I2721 Secondary pulmonary arterial hypertension: Secondary | ICD-10-CM

## 2023-11-10 NOTE — Assessment & Plan Note (Addendum)
 Counseled re importance of smoking cessation but did not meet time criteria for separate billing

## 2023-11-10 NOTE — Assessment & Plan Note (Addendum)
 Active smoker - 11/09/2022  After extensive coaching inhaler device,  effectiveness =    60% short > try off breo due to cough and start symbicort  80 or dulera  100 2bid and return for pfts - PFT's  01/11/23  FEV1 1.32 (55 % ) ratio 0.81  p 7 % improvement from saba p dulera  prior to study with DLCO  11.33 (58%)   and FV curve no obstruction and ERV 33%  at wt 206   - 02/09/2023  After extensive coaching inhaler device,  effectiveness =    80% (delayed insp) > continue dulera  100 2bid / add zyrtec  for rhinitis and d/c acei  - 08/10/2023 ov : worse off dulera  due to insurance > try symbicort  80 or breyna  80   All goals of chronic asthma control met including optimal function and elimination of symptoms with minimal need for rescue therapy.  Contingencies discussed in full including contacting this office immediately if not controlling the symptoms using the rule of two's.     >>> add zyrtec   10 mg at bedtime prn for cough which sounds seasonal and not  asthma related

## 2023-11-10 NOTE — Progress Notes (Signed)
 [name], female    DOB: 06/24/1955    MRN: [id number]   Brief patient profile:  34   yowf  active smoker  referred to pulmonary clinic in [place]  11/09/2022 by Dr Melvenia for new doe Jan 2024 corrected with [place] then Sept 21st with abrupt chest and sinus congestion > yellow mucus rocepin/ amox then levaquin and prednisone  and completing 11/10/22.  Cx spine surgery around 2010 > upper airway wheeze ever since   History of Present Illness  11/09/2022  Pulmonary/ 1st office eval/ Ryann Leavitt / [place] [facility]  Chief Complaint  Patient presents with   Consult  Dyspnea:  85% better and still on last doses of abx /pred Cough: still some light green mucus with sense of nasal/ chest congestion on mucinex  dm  Sleep: 30 degrees hob  SABA use: couple times a day at most  02: none  Lung cancer screen: per PCP but would not start until sort out the present problems  Rec Plan A = Automatic = Always=    Breztri  (symbicort  80) Take 2 puffs first thing in am and then another 2 puffs about 12 hours later.   Work on inhaler technique:  >>>  Remember how golfers warm up by taking practice swings - do this with an empty inhaler  Plan B = Backup (to supplement plan A, not to replace it) Only use your albuterol  inhaler as a rescue medication The key is to stop smoking completely before smoking completely stops you! For cough/ congestion > mucinex  or mucinex  dm  up to maximum of  1200 mg every 12 hours as needed  My office will be contacting you by phone for referral for repeat CT 3  Please schedule a follow up visit in 3 months but call sooner if needed  with all medications /inhalers/ solutions in hand    02/09/2023  f/u ov/[place] office/Normal Recinos re: AB  maint on dulera   did bring inhalers  Chief Complaint  Patient presents with   Pulmonary Nodule  Dyspnea:  improved  Cough: freq throat clearing assoc with sensation  of pnds  Sleeping: ok s   resp cc  SABA use: none 02: none Rec Valsartan  40  mg one daily in place of lisinopril   Try zyrtec  OTC at bedtime  Work on inhaler technique:     08/10/2023  f/u ov/[place] office/Shawanna Zanders re: AB ? Acei case vs resdual UACS from neck surgery 2010  maint on no rx/ still smoking   Chief Complaint  Patient presents with   Follow-up    Increased SOB past couple of months- relates to hot weather. She has also had some wheezing. Ran out of Dulera  in Feb 2025 and has been using her albuterol  inhaler 4-5 x per day on average.   Dyspnea:  worse off dulera   Cough: minimal pnds much better off acei  Sleeping: flat bed one pillow s    resp cc cpap/ 2lpm  SABA use: 4-5 x time per day  02: 2lpm hs only  Lung cancer screening: due q  Jan 2026  Rec Try symbicort  80 or Breya 80 Take 2 puffs first thing in am and then another 2 puffs about 12 hours later In place of dulera   My office will be contacting you by phone for referral to ENT for your throat wheezing  > not done as of  10/18/2023 The key is to stop smoking completely before smoking completely stops you! Please schedule a follow up visit in 6 months  but call sooner if needed    Syncope 09/28/23 [facility] > w/u with CT chest s contrast, no d dimer or BNP      10/18/2023 ACUTE ov/[place] office/Crayton Savarese re: sob  maint on breyna   still smoking  Chief Complaint  Patient presents with   Acute Visit    Increased SOB over the past week- went out of town for work and passed out twice while walking. She has had cough- non prod, chest tightness and wheezing. She is using her albuterol  inhaler at least 3 x daily without relief.    Dyspnea:  bad to worse sat am Sept 20 p being able to walk slow thru [place] and [place] pm 10/14/23 and worse since then assoc with presyncope Cough: assoc with cough same day  Sleeping: flat in bed one pillow with cpap/ 02  02: 2lpm  HFA  no better  Lung cancer screening: neg CT chest 09/28/23 Rec Admit  Admit date: 10/18/2023 Discharge date: 10/24/2023  Discharge Diagnoses:   Principal Problem:   Acute on chronic respiratory failure with hypoxia (HCC) Pulmonary hypertension Acute on chronic diastolic CHF   Anxiety   Carotid artery stenosis   Essential hypertension   Depression   GERD (gastroesophageal reflux disease)   CAD (coronary artery disease)   COPD (chronic obstructive pulmonary disease) (HCC)   Mixed hyperlipidemia   Aortic stenosis   Type 2 diabetes mellitus with hyperglycemia (HCC)   DOE (dyspnea on exertion)   Obesity, Class I, BMI 30.0-34.9 (see actual BMI)   Hypomagnesemia   Pulmonary hypertension, unspecified (HCC)     History of present illness:  68 year old female with history of pulmonary hypertension associated with right ventricular dysfunction, OSA on CPAP, asthmatic bronchitis, aortic stenosis, coronary artery disease, non-STEMI status post PCI, COPD, hypertension, hyperlipidemia, morbid obesity, diabetes type 2, anxiety who presented after she was referred from pulmonology office for the evaluation of acute on chronic hypoxic respiratory failure, increased cough, congestion, shortness of breath, dizziness. On presentation, she was hypoxic and was put on 2 L of oxygen  per minute. COVID/flu/RSV negative. CTA chest negative for PE, showed dilation of main pulmonary artery reflecting underlying pulmonary hypertension.    Hospital Course:    Acute on chronic HFpEF/pulmonary hypertension/chronic PE: - Presented with dyspnea with minimal exertion, hypoxia  -CT chest negative for PE but showed dilation of main pulmonary artery consistent with pulmonary hypertension.   -2D echo showed hyperdynamic left ventricular function with EF of 70 to 75%, grade 2 diastolic dysfunction, features suggesting right ventricular pressure and volume overload.   -Heart failure team consulted, felt to be combination of group 1 and 2 pulmonary hypertension,  -RHC noted elevated right and left heart pressures mean PAP 64, PVR 10 CTA chest noted no significant  parenchymal lung disease, she is a former smoker, VQ scan concerning for chronic PE, bubble study suggestive of PFO, serologies negative - Improved with diuresis, continued on sildenafil  - Dr. Rolan recommended outpatient follow-up for titration of pulmonary vasodilators and referral to [facility] program   History of coronary artery disease: No anginal symptoms history of NSTEMI in 2016 treated with LAD stent.  LHC done on 9/25 showed nonobstructive disease and patent LAD -Continue apixaban and statin      11/10/2023  f/u ov/[place] office/Emri Sample re: post hosp ? CTEPH  maint on symbicort  80 and still smoking   Chief Complaint  Patient presents with   Asthma    Hsp f/u CT - pulm perfusion - HRCT completed  in hosp  Dyspnea:  was walking floor room air prior to discharge sats 93-94% RA Cough: minimal daytime dry raspy spring and fall > better enough  p rx zyrtec  Sleeping: flat bed / one pillow and cpap x years  s  resp cc  SABA use: none  02: 2lpm hs  at  hs thru cpap / none daytime    No obvious day to day or daytime variability or assoc excess/ purulent sputum or mucus plugs or hemoptysis or cp or chest tightness, subjective wheeze or overt sinus or hb symptoms.    Also denies any obvious fluctuation of symptoms with weather or environmental changes or other aggravating or alleviating factors except as outlined above   No unusual exposure hx or h/o childhood pna/ asthma or knowledge of premature birth.  Current Allergies, Complete Past Medical History, Past Surgical History, Family History, and Social History were reviewed in [employer] record.  ROS  The following are not active complaints unless bolded Hoarseness, sore throat, dysphagia, dental problems, itching, sneezing,  nasal congestion or discharge of excess mucus or purulent secretions, ear ache,   fever, chills, sweats, unintended wt loss or wt gain, classically pleuritic or exertional cp,  orthopnea  pnd or arm/hand swelling  or leg swelling, presyncope, palpitations, abdominal pain, anorexia, nausea, vomiting, diarrhea  or change in bowel habits or change in bladder habits, change in stools or change in urine, dysuria, hematuria,  rash, arthralgias, visual complaints, headache, numbness, weakness or ataxia or problems with walking or coordination,  change in mood or  memory.        Current Meds  Medication Sig   albuterol  (VENTOLIN  HFA) 108 (90 Base) MCG/ACT inhaler Inhale 2 puffs into the lungs every 6 (six) hours as needed for wheezing or shortness of breath.   amLODipine (NORVASC) 5 MG tablet Take 1 tablet (5 mg total) by mouth daily.   apixaban (ELIQUIS) 5 MG TABS tablet Take 1 tablet (5 mg total) by mouth 2 (two) times daily.   atorvastatin  (LIPITOR ) 40 MG tablet TAKE 1 TABLET (40 MG TOTAL) BY MOUTH EVERY OTHER DAY. TAKE AT DINNER   budesonide -formoterol  (BREYNA ) 80-4.5 MCG/ACT inhaler Inhale 2 puffs into the lungs 2 (two) times daily.   Continuous Glucose Sensor (FREESTYLE LIBRE 3 PLUS SENSOR) MISC Change sensor every 15 days.   escitalopram  (LEXAPRO ) 20 MG tablet TAKE 1 TABLET (20 MG TOTAL) BY MOUTH DAILY AT 6 (SIX) AM.   FARXIGA  10 MG TABS tablet TAKE 1 TABLET BY MOUTH EVERY DAY   metFORMIN  (GLUCOPHAGE ) 1000 MG tablet Take 1,000 mg by mouth 2 (two) times daily with a meal. 10/25/23 This Medication is on pause and confirmed by PCP - Dr. Suzzane Blanch and will discuss at hospital follow up appointment.   nystatin  cream (MYCOSTATIN ) Apply 1 Application topically 2 (two) times daily.   tirzepatide  (MOUNJARO ) 5 MG/0.5ML Pen Inject 5 mg into the skin once a week.   torsemide (DEMADEX) 20 MG tablet Take 1 tablet (20 mg total) by mouth daily.                Past Medical History:  Diagnosis Date   Anxiety    Aortic stenosis    CAD (coronary artery disease)    a. 10/2014: Ant STEMI s/p DES to dLAD   Carotid disease, bilateral (HCC)    a. Duplex 12/2014: mild-mod atherosclerotic  plaque without hemodynamically significant stenosis.   COPD (chronic obstructive pulmonary disease) (HCC)    Essential  hypertension    HLD (hyperlipidemia)    Morbid obesity (HCC)    Myocardial infarction (HCC) 11/17/2014   PONV (postoperative nausea and vomiting)    Shingles    Sleep apnea    Uses CPAP   Type 2 diabetes mellitus (HCC)       Objective:    Wts  11/10/2023      188 10/18/2023        213   08/10/2023       202  02/09/23 188 lb (85.3 kg)  01/14/23 193 lb (87.5 kg)  11/09/22 200 lb 6.4 oz (90.9 kg)     Vital signs reviewed  11/10/2023  - Note at rest 02 sats  98% on    General appearance:    amb  Mod obese (by BMI) wf nad    HEENT : Oropharynx  clear          NECK :  without  apparent JVD/ palpable Nodes/TM    LUNGS: no acc muscle use,  Nl contour chest which is clear to A and P bilaterally without cough on insp or exp maneuvers   CV:  RRR  no s3 or murmur - def increase in P2, and no edema   ABD:  soft and nontender   MS:  Gait nl   ext warm without deformities Or obvious joint restrictions  calf tenderness, cyanosis or clubbing    SKIN: warm and dry without lesions    NEURO:  alert, approp, nl sensorium with  no motor or cerebellar deficits apparent.                Assessment   Assessment & Plan Pulmonary artery hypertension (HCC) See admit 10/18/23 with RHC  PA mean 64/ wedge 10 and vq suggestive of PE  - 11/10/2023   Walked on RA  x  3  lap(s) =  approx 450  ft  @ mod pace, stopped due to end of study  with lowest 02 sats 91% but improved to 93% before stopping   - 11/10/2023 requested ONO on 2lpm and present cpap settings   Agree that CTEPH a concern but will finish the w/u for WHO 3 with overnight pulse ox on her new setting of 02 2lpm added to the cpap and in meantime support a second opinion at [facility]  My previous experience in this setting was to treat the underlying problem 1st  for six months an then redo the echo or RHC but I defer  the timing to Dr Vergil.      Asthmatic bronchitis , chronic (HCC) Active smoker - 11/09/2022  After extensive coaching inhaler device,  effectiveness =    60% short > try off breo due to cough and start symbicort  80 or dulera  100 2bid and return for pfts - PFT's  01/11/23  FEV1 1.32 (55 % ) ratio 0.81  p 7 % improvement from saba p dulera  prior to study with DLCO  11.33 (58%)   and FV curve no obstruction and ERV 33%  at wt 206   - 02/09/2023  After extensive coaching inhaler device,  effectiveness =    80% (delayed insp) > continue dulera  100 2bid / add zyrtec  for rhinitis and d/c acei  - 08/10/2023 ov : worse off dulera  due to insurance > try symbicort  80 or breyna  80   All goals of chronic asthma control met including optimal function and elimination of symptoms with minimal need for rescue therapy.  Contingencies discussed in  full including contacting this office immediately if not controlling the symptoms using the rule of two's.     >>> add zyrtec   10 mg at bedtime prn for cough which sounds seasonal and not  asthma related   Cigarette smoker Counseled re importance of smoking cessation but did not meet time criteria for separate billing     Each maintenance medication was reviewed in detail including emphasizing most importantly the difference between maintenance and prns and under what circumstances the prns are to be triggered using an action plan format where appropriate.  Total time for H and P, chart review, counseling, reviewing hfa/ 02/pulse ox device(s) , directly observing portions of ambulatory 02 saturation study/ and generating customized AVS unique to this office visit / same day charting = 43 min post hosp f/u                AVS  Patient Instructions  Zyrtec  10 mg  in evening as needed for itching sneezing throat tickle  The goal is to stop smoking completely before smoking completely stops you!   My office will be contacting you by phone for referral to  Overnight oximetry on CPAP and 02   - if you don't hear back from my office within one week please call us  back or notify us  thru MyChart and we'll address it right away.   I would support a second opinion about your pulmonary hypertension from the group at [facility]  Follow up in this clinic is as needed             Ozell America, MD 11/10/2023

## 2023-11-10 NOTE — Patient Instructions (Addendum)
 Zyrtec  10 mg  in evening as needed for itching sneezing throat tickle  The goal is to stop smoking completely before smoking completely stops you!   My office will be contacting you by phone for referral to Overnight oximetry on CPAP and 02   - if you don't hear back from my office within one week please call us  back or notify us  thru MyChart and we'll address it right away.   I would support a second opinion about your pulmonary hypertension from the group at [facility]  Follow up in this clinic is as needed

## 2023-11-10 NOTE — Assessment & Plan Note (Addendum)
 See admit 10/18/23 with RHC  PA mean 64/ wedge 10 and vq suggestive of PE  - 11/10/2023   Walked on RA  x  3  lap(s) =  approx 450  ft  @ mod pace, stopped due to end of study  with lowest 02 sats 91% but improved to 93% before stopping   - 11/10/2023 requested ONO on 2lpm and present cpap settings   Agree that CTEPH a concern but will finish the w/u for WHO 3 with overnight pulse ox on her new setting of 02 2lpm added to the cpap and in meantime support a second opinion at [facility]  My previous experience in this setting was to treat the underlying problem 1st  for six months an then redo the echo or RHC but I defer the timing to Dr Vergil.

## 2023-11-13 DIAGNOSIS — R7989 Other specified abnormal findings of blood chemistry: Secondary | ICD-10-CM | POA: Insufficient documentation

## 2023-11-13 NOTE — Assessment & Plan Note (Signed)
 Lab Results  Component Value Date   TSH 4.819 (H) 10/22/2023   Borderline elevated TSH likely due to recent stressor (CHF exacerbation) Recheck TSH and free T4

## 2023-11-13 NOTE — Assessment & Plan Note (Signed)
 RHC (09/25) noted elevated right and left heart pressures mean PAP 64, PVR 10 CTA chest noted no significant parenchymal lung disease, she is a former smoker, VQ scan was concerning for chronic PE, bubble study suggestive of PFO, serologies negative - Improved with diuresis, continued on sildenafil  - plan to switch to Adempas - Dr. Rolan recommended outpatient follow-up for titration of pulmonary vasodilators and referral to [facility]TE[facility] program

## 2023-11-13 NOTE — Assessment & Plan Note (Signed)
 Has chronic cough Followed by pulmonology, has Breyna  as maintenance inhaler Has albuterol  as needed for dyspnea wheezing

## 2023-11-13 NOTE — Assessment & Plan Note (Addendum)
 Recent hospital chart reviewed On torsemide 20 mg QD On Farxiga  10 mg QD Was on ARB, metoprolol  and Imdur  - discontinued during recent hospitalization due to low BP Appears euvolemic currently Followed by CHF clinic now

## 2023-11-13 NOTE — Assessment & Plan Note (Signed)
On atorvastatin 40 mg QD

## 2023-11-13 NOTE — Assessment & Plan Note (Addendum)
 Lab Results  Component Value Date   HGBA1C 7.3 (H) 10/19/2023   Uncontrolled Continue Metformin  1000 mg BID Reduced dose of Mounjaro  to 5 mg qw as she had stopped taking it due to GI side effects from higher doses On Farxiga  10 mg QD Advised to follow diabetic diet On statin F/u CMP and lipid panel Diabetic eye exam: Advised to follow up with Ophthalmology for diabetic eye exam

## 2023-11-13 NOTE — Assessment & Plan Note (Signed)
 BP Readings from Last 1 Encounters:  11/10/23 115/67   Well-controlled with torsemide 20 mg QD Was on valsartan  40 mg QD, metoprolol  37.5 mg QD and Imdur  30 mg QD - recently discontinued due to tightly controlled BP during hospitalization Counseled for compliance with the medications Advised DASH diet and moderate exercise/walking, at least 150 mins/week

## 2023-11-14 DIAGNOSIS — G4733 Obstructive sleep apnea (adult) (pediatric): Secondary | ICD-10-CM | POA: Diagnosis not present

## 2023-11-14 DIAGNOSIS — J449 Chronic obstructive pulmonary disease, unspecified: Secondary | ICD-10-CM | POA: Diagnosis not present

## 2023-11-16 ENCOUNTER — Other Ambulatory Visit (HOSPITAL_COMMUNITY): Payer: Self-pay

## 2023-11-16 MED ORDER — TORSEMIDE 20 MG PO TABS: 20.0000 mg | ORAL_TABLET | Freq: Every day | ORAL | 3 refills | Status: AC

## 2023-11-16 MED ORDER — APIXABAN 5 MG PO TABS: 5.0000 mg | ORAL_TABLET | Freq: Two times a day (BID) | ORAL | 3 refills | Status: AC

## 2023-11-16 MED ORDER — AMLODIPINE BESYLATE 5 MG PO TABS: 5.0000 mg | ORAL_TABLET | Freq: Every day | ORAL | 3 refills | Status: DC

## 2023-11-29 ENCOUNTER — Ambulatory Visit (HOSPITAL_COMMUNITY)
Admission: RE | Admit: 2023-11-29 | Discharge: 2023-11-29 | Disposition: A | Discharge status: Home / Self Care | Source: Ambulatory Visit | Attending: Cardiology | Admitting: Cardiology

## 2023-11-29 ENCOUNTER — Other Ambulatory Visit (HOSPITAL_COMMUNITY): Payer: Self-pay

## 2023-11-29 ENCOUNTER — Encounter (HOSPITAL_COMMUNITY): Payer: Self-pay | Admitting: Cardiology

## 2023-11-29 VITALS — BP 90/50 | HR 91 | Wt 189.0 lb

## 2023-11-29 DIAGNOSIS — J4489 Other specified chronic obstructive pulmonary disease: Secondary | ICD-10-CM | POA: Insufficient documentation

## 2023-11-29 DIAGNOSIS — I2724 Chronic thromboembolic pulmonary hypertension: Secondary | ICD-10-CM | POA: Diagnosis not present

## 2023-11-29 DIAGNOSIS — G4733 Obstructive sleep apnea (adult) (pediatric): Secondary | ICD-10-CM | POA: Insufficient documentation

## 2023-11-29 DIAGNOSIS — I110 Hypertensive heart disease with heart failure: Secondary | ICD-10-CM | POA: Insufficient documentation

## 2023-11-29 DIAGNOSIS — I6523 Occlusion and stenosis of bilateral carotid arteries: Secondary | ICD-10-CM | POA: Insufficient documentation

## 2023-11-29 DIAGNOSIS — I2721 Secondary pulmonary arterial hypertension: Secondary | ICD-10-CM

## 2023-11-29 DIAGNOSIS — Z955 Presence of coronary angioplasty implant and graft: Secondary | ICD-10-CM | POA: Insufficient documentation

## 2023-11-29 DIAGNOSIS — R55 Syncope and collapse: Secondary | ICD-10-CM | POA: Insufficient documentation

## 2023-11-29 DIAGNOSIS — E119 Type 2 diabetes mellitus without complications: Secondary | ICD-10-CM | POA: Diagnosis not present

## 2023-11-29 DIAGNOSIS — Z7901 Long term (current) use of anticoagulants: Secondary | ICD-10-CM | POA: Insufficient documentation

## 2023-11-29 DIAGNOSIS — Z7984 Long term (current) use of oral hypoglycemic drugs: Secondary | ICD-10-CM | POA: Diagnosis not present

## 2023-11-29 DIAGNOSIS — F17210 Nicotine dependence, cigarettes, uncomplicated: Secondary | ICD-10-CM | POA: Diagnosis not present

## 2023-11-29 DIAGNOSIS — I2510 Atherosclerotic heart disease of native coronary artery without angina pectoris: Secondary | ICD-10-CM | POA: Insufficient documentation

## 2023-11-29 DIAGNOSIS — I272 Pulmonary hypertension, unspecified: Secondary | ICD-10-CM | POA: Insufficient documentation

## 2023-11-29 DIAGNOSIS — R7989 Other specified abnormal findings of blood chemistry: Secondary | ICD-10-CM | POA: Diagnosis not present

## 2023-11-29 DIAGNOSIS — I5032 Chronic diastolic (congestive) heart failure: Secondary | ICD-10-CM | POA: Diagnosis not present

## 2023-11-29 DIAGNOSIS — D6851 Activated protein C resistance: Secondary | ICD-10-CM | POA: Diagnosis not present

## 2023-11-29 DIAGNOSIS — Z7985 Long-term (current) use of injectable non-insulin antidiabetic drugs: Secondary | ICD-10-CM | POA: Diagnosis not present

## 2023-11-29 DIAGNOSIS — I350 Nonrheumatic aortic (valve) stenosis: Secondary | ICD-10-CM | POA: Insufficient documentation

## 2023-11-29 DIAGNOSIS — I252 Old myocardial infarction: Secondary | ICD-10-CM | POA: Diagnosis not present

## 2023-11-29 DIAGNOSIS — Z716 Tobacco abuse counseling: Secondary | ICD-10-CM | POA: Diagnosis not present

## 2023-11-29 DIAGNOSIS — Z79899 Other long term (current) drug therapy: Secondary | ICD-10-CM | POA: Diagnosis not present

## 2023-11-29 LAB — BASIC METABOLIC PANEL WITH GFR
Anion gap: 17 — ABNORMAL HIGH (ref 5–15)
BUN: 26 mg/dL — ABNORMAL HIGH (ref 8–23)
CO2: 23 mmol/L (ref 22–32)
Calcium: 9.1 mg/dL (ref 8.9–10.3)
Chloride: 99 mmol/L (ref 98–111)
Creatinine, Ser: 1.03 mg/dL — ABNORMAL HIGH (ref 0.44–1.00)
GFR, Estimated: 60 mL/min — ABNORMAL LOW (ref >60)
Glucose, Bld: 120 mg/dL — ABNORMAL HIGH (ref 70–99)
Potassium: 4.2 mmol/L (ref 3.5–5.1)
Sodium: 139 mmol/L (ref 135–145)

## 2023-11-29 LAB — LIPID PANEL
Cholesterol: 103 mg/dL (ref 0–200)
HDL: 44 mg/dL (ref >40)
LDL Cholesterol: 33 mg/dL (ref 0–99)
Total CHOL/HDL Ratio: 2.3 ratio
Triglycerides: 131 mg/dL (ref <150)
VLDL: 26 mg/dL (ref 0–40)

## 2023-11-29 LAB — BRAIN NATRIURETIC PEPTIDE: B Natriuretic Peptide: 38.9 pg/mL (ref 0.0–100.0)

## 2023-11-29 MED ORDER — AMLODIPINE BESYLATE 2.5 MG PO TABS: 2.5000 mg | ORAL_TABLET | Freq: Every day | ORAL | 3 refills | Status: AC

## 2023-11-29 NOTE — Patient Instructions (Addendum)
 CHANGE Amlodipine to 2.5 mg daily.  START Opsumit 10 mg daily.  Labs done today, your results will be available in MyChart, we will contact you for abnormal readings.  Your physician recommends that you schedule a follow-up appointment as scheduled.  If you have any questions or concerns before your next appointment please send us  a message through [place] or call our office at [phone].    TO LEAVE A MESSAGE FOR THE NURSE SELECT OPTION 2, PLEASE LEAVE A MESSAGE INCLUDING: YOUR NAME DATE OF BIRTH CALL BACK NUMBER REASON FOR CALL**this is important as we prioritize the call backs  YOU WILL RECEIVE A CALL BACK THE SAME DAY AS LONG AS YOU CALL BEFORE 4:00 PM  At the [facility], you and your health needs are our priority. As part of our continuing mission to provide you with exceptional heart care, we have created designated Provider Care Teams. These Care Teams include your primary Cardiologist (physician) and Advanced Practice Providers (APPs- Physician Assistants and Nurse Practitioners) who all work together to provide you with the care you need, when you need it.   You may see any of the following providers on your designated Care Team at your next follow up: Dr Toribio Fuel Dr Ezra Shuck Dr. Morene Brownie Greig Mosses, NP Caffie Shed, [place] [facility] [place], [place] Beckey Coe, NP Jordan Lee, NP Ellouise Class, NP Tinnie Redman, PharmD Jaun Bash, PharmD   Please be sure to bring in all your medications bottles to every appointment.    Thank you for choosing [facility]

## 2023-11-29 NOTE — Progress Notes (Signed)
 6 Min Walk Test Completed  Pt ambulated 975 ft (297.50m) O2 Sat ranged 90-92 on 0 L oxygen  HR ranged 101-118

## 2023-11-30 ENCOUNTER — Telehealth (HOSPITAL_COMMUNITY): Payer: Self-pay

## 2023-11-30 ENCOUNTER — Ambulatory Visit (HOSPITAL_COMMUNITY): Payer: Self-pay | Admitting: Cardiology

## 2023-11-30 ENCOUNTER — Other Ambulatory Visit (HOSPITAL_COMMUNITY): Payer: Self-pay

## 2023-11-30 NOTE — Progress Notes (Signed)
 PCP: [name], MD Cardiology: Dr. Rolan  Chief Complaint: CHF  HPI: [name] is a 68 y.o. female w/ history of chronic asthmatic bronchitis/smoking followed by Dr. Darlean, OSA on CPAP, obesity, pulmonary hypertension w/ associated RV dysfunction, CAD s/p anterior STEMI w/ DES to LAD in 2016, carotid stenosis.    Echo in 2022 showed normal LVEF 60-65, RV mildly enlarged, mildly reduced RV function, D-shaped interventricular septum, estimated RVSP 57 mmHg.    PFTs 12/24 c/w minimal obstruction, + restriction c/w body habitus and moderate diffusion defect.    Sent from pulmonary clinic to the ED in 9/25 given complaint of worsening SOB w/ minimal exertion and hypoxic respiratory failure as well as numerous near-syncopal spells.   Initial O2 sats were in the upper 80s. Had also endorsed recent travel to [facility] prompting PE evaluation. D-dimer was elevated at 3.12 but chest CTA negative for acute PE. Study did show dilation of main pulmonary artery suggesting underlying pulmonary hypertension.  V/Q scan was concerning for chronic PE.  Echo showed hyperdynamic LV function, EF 70-75%, GIIDD, interventricular septum flattened in systole and diastole consistent with right ventricular pressure and volume overload, RV mildly enlarged w/ mild-moderately reduced RV systolic function, estimated RVSP 77 mmHg, mild-mod TR, IVC dilated  with <50% respiratory variability suggesting right atrial pressure of 15 mmHg, mild AS with mean gradient 10 mmHg. LHC done showing mild, nonobstructive coronary artery disease with patent mid to distal LAD stent. 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 She was diuresed and started on sildenafil . Repeat RHC prior to discharge w/ normal filling pressures and persistent pulmonary arterial hypertension. She was found to be a Factor V Leiden heterozygote.   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    She returns for followup of pulmonary hypertension/RV failure. She is back to working full time.  She is mildly short of breath with a longer walk, this is considerably improved. No problems with ADLs.  No chest pain.  BP is running on the low side, SBP in 90s today.  However, no lightheadedness or presyncope. She is cutting back on smoking, down to 1-2 cigarettes/day. Weight down 6 lbs since last appt.   6 minute walk (11/25): 297 m  ECG (personally reviewed): NSR, PACs, right axis deviation  Labs (9/25): HIV negative, prothrombin gene mutation negative, Factor V Leiden heterozygote, anti-cardiolipin negative, beta-2 glycoprotein negative, lupus anticoagulant negative, anti-centromere negative, anti-SCL 70 negative, ANA negative, RF negative Labs (10/25): BNP 27.5, K 4.2, creatinine 1.09  PMH: 1. HTN 2. OSA: CPAP 3. Venous thromboembolism: Concern for chronic PE by V/Q scan in 9/25.  CTA chest did not show acute PE, and lower extremity venous dopplers did not show acute DVT.  Patient found to be heterozygote for Factor V Leiden.  4. 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  - Echo (9/25): LV EF 70-75%, D-shaped septum, mild RVE with mild RV systolic dysfunction, PASP 77 mmHg, mild AS mean gradient 10 mmHg, IVC dilated.  - RHC (9/25): RA 22, PAP 114/35, PCW 26, PVR 11.2 WU, PAPi 3.6, CI 1.9 by TD, 1.7 by FICK - Repeat RHC (9/25): RA 6, PA 86/28 mean 51, mean PCWP 13, CI 2.72 Fick, PVR 7.2 WU.  No evidence for step-up in oxygen  saturation from SVC to PA.  - HIV negative, anti-centromere negative, anti-SCL 70 negative, ANA  negative, RF negative - HRCT chest (9/25): Possible small airways disease, no evidence for ILD. No emphysema or bronchiectasis.  5. Active smoker: PFTs in 12/24 and HRCT chest in 9/25 not suggestive of significant COPD.  6. Carotid stenosis: Carotid dopplers (10/25) with 50-69% RICA.  7. CAD: Anterior STEMI 2016 with DES to LAD.   - LHC (9/25): Nonobstructive CAD.  8. OSA: CPAP.  9. DM2 10. Hyperlipidemia  ROS: All systems negative except as listed in HPI, PMH and Problem List.  SH:  Social History   Socioeconomic History   Marital status: Widowed    Spouse name: Not on file   Number of children: Not on file   Years of education: Not on file   Highest education level: Associate degree: occupational, scientist, product/process development, or vocational program  Occupational History   Occupation: Office work for [employer]  Tobacco Use   Smoking status: Every Day    Current packs/day: 1.00    Average packs/day: 1 pack/day for 50.9 years (50.9 ttl pk-yrs)    Types: Cigarettes    Start date: 12/26/1972   Smokeless tobacco: Never   Tobacco comments:    3 cigs per day currently 11/07/23  Vaping Use   Vaping status: Never Used  Substance and Sexual Activity   Alcohol  use: No    Alcohol /week: 0.0 standard drinks of alcohol    Drug use: No   Sexual activity: Yes    Partners: Male    Birth control/protection: Surgical  Other Topics Concern   Not on file  Social History Narrative   Not on file   Social Drivers of Health   Financial Resource Strain: Low Risk  (08/06/2023)   Overall Financial Resource Strain (CARDIA)    Difficulty of Paying Living Expenses: Not hard at all  Food Insecurity: No Food Insecurity (10/25/2023)   Hunger Vital Sign    Worried About Running Out of Food in the Last Year: Never true    Ran Out of Food in the Last Year: Never true  Transportation Needs: No Transportation Needs (10/25/2023)   PRAPARE - Administrator, Civil Service (Medical): No    Lack of Transportation (Non-Medical): No  Physical Activity: Insufficiently Active (08/06/2023)   Exercise Vital Sign    Days of Exercise per Week: 2 days    Minutes of Exercise per Session: 30 min  Stress: No Stress Concern Present (08/06/2023)   [employer] of Occupational Health - Occupational Stress Questionnaire    Feeling of  Stress: Not at all  Social Connections: Moderately Integrated (08/06/2023)   Social Connection and Isolation Panel    Frequency of Communication with Friends and Family: More than three times a week    Frequency of Social Gatherings with Friends and Family: More than three times a week    Attends Religious Services: More than 4 times per year    Active Member of [employer] or Organizations: Yes    Attends Banker Meetings: More than 4 times per year    Marital Status: Widowed  Intimate Partner Violence: Not At Risk (10/25/2023)   Humiliation, Afraid, Rape, and Kick questionnaire    Fear of Current or Ex-Partner: No    Emotionally Abused: No    Physically Abused: No    Sexually Abused: No    FH:  Family History  Problem Relation Age of Onset   Heart attack Father    Heart disease Father    Cancer Mother    Heart disease Mother  Cancer Brother    Cancer Maternal Grandfather    Cancer Maternal Grandmother    Cancer Maternal Aunt    Cancer Maternal Aunt    Cancer Maternal Aunt    Cancer Maternal Aunt     Past Medical History:  Diagnosis Date   Allergy    Codeine, Sulfur   Anxiety    Aortic stenosis    Asthma 01/26/2023   CAD (coronary artery disease)    a. 10/2014: Ant STEMI s/p DES to dLAD   Carotid disease, bilateral    a. Duplex 12/2014: mild-mod atherosclerotic plaque without hemodynamically significant stenosis.   Cataract 04/08/2016   CHF (congestive heart failure) (HCC) 08/27/2018   COPD (chronic obstructive pulmonary disease) (HCC)    Essential hypertension    Heart murmur 11/17/2014   HLD (hyperlipidemia)    Morbid obesity (HCC)    Myocardial infarction (HCC) 11/17/2014   Oxygen  deficiency 1996   1 LITER AT BEDTIME WITH BIPAP   PONV (postoperative nausea and vomiting)    Shingles    Sleep apnea    Uses CPAP   Type 2 diabetes mellitus (HCC)     Current Outpatient Medications  Medication Sig Dispense Refill   albuterol  (VENTOLIN  HFA) 108 (90 Base)  MCG/ACT inhaler Inhale 2 puffs into the lungs every 6 (six) hours as needed for wheezing or shortness of breath.     apixaban (ELIQUIS) 5 MG TABS tablet Take 1 tablet (5 mg total) by mouth 2 (two) times daily. 180 tablet 3   atorvastatin  (LIPITOR ) 40 MG tablet TAKE 1 TABLET (40 MG TOTAL) BY MOUTH EVERY OTHER DAY. TAKE AT DINNER 45 tablet 1   budesonide -formoterol  (BREYNA ) 80-4.5 MCG/ACT inhaler Inhale 2 puffs into the lungs 2 (two) times daily.     Continuous Glucose Sensor (FREESTYLE LIBRE 3 PLUS SENSOR) MISC Change sensor every 15 days. 6 each 3   escitalopram  (LEXAPRO ) 20 MG tablet TAKE 1 TABLET (20 MG TOTAL) BY MOUTH DAILY AT 6 (SIX) AM. 90 tablet 1   FARXIGA  10 MG TABS tablet TAKE 1 TABLET BY MOUTH EVERY DAY 90 tablet 0   metFORMIN  (GLUCOPHAGE ) 1000 MG tablet Take 1,000 mg by mouth 2 (two) times daily with a meal. 10/25/23 This Medication is on pause and confirmed by PCP - Dr. Suzzane Blanch and will discuss at hospital follow up appointment.     nystatin  cream (MYCOSTATIN ) Apply 1 Application topically as needed for dry skin.     Riociguat (ADEMPAS) 0.5 MG TABS Take 0.5 mg by mouth 3 (three) times daily.     tirzepatide  (MOUNJARO ) 5 MG/0.5ML Pen Inject 5 mg into the skin once a week. 6 mL 1   torsemide (DEMADEX) 20 MG tablet Take 1 tablet (20 mg total) by mouth daily. 90 tablet 3   amLODipine (NORVASC) 2.5 MG tablet Take 1 tablet (2.5 mg total) by mouth daily. 90 tablet 3   No current facility-administered medications for this encounter.    Vitals:   11/29/23 1516  BP: (!) 90/50  Pulse: 91  SpO2: 91%  Weight: 85.7 kg (189 lb)    PHYSICAL EXAM: General: NAD Neck: No JVD, no thyromegaly or thyroid  nodule.  Lungs: Clear to auscultation bilaterally with normal respiratory effort. CV: Nondisplaced PMI.  Heart regular S1/S2, no S3/S4, no murmur.  No peripheral edema.  No carotid bruit.  Normal pedal pulses.  Abdomen: Soft, nontender, no hepatosplenomegaly, no distention.  Skin: Intact  without lesions or rashes.  Neurologic: Alert and oriented x 3.  Psych: Normal affect. Extremities: No clubbing or cyanosis.  HEENT: Normal.   ASSESSMENT & PLAN: 1. Pulmonary hypertension: Patient has severe pulmonary arterial hypertension with evidence with RV dysfunction by echo.  Echo in 9/25 showed EF 70-75% with D-shaped interventricular septum, mild RV enlargement with mild-moderate RV dysfunction, PASP 77 mmHg, dilated IVC, mild aortic stenosis.   RHC in 9/25 showed elevated right and left heart filling pressures with mean PAP 64 mmHg, PVR 10 WU.  CTA chest in 9/25 with no significant lung parenchymal disease and no acute PE.  Prior pulmonary workup revealed OSA and mild asthma.  Patient is a prior smoker.  She is on CPAP at night.  She is not hypoxemic at rest. Do not think that OSA can explain the extent of her PH.  CI was low on initial RHC, 1.7 by Fick and 1.9 by thermodilution.  This is an ominous finding.  Her episodes of presyncope are also concerning.  V/Q scan in 9/25 was concerning for chronic PEs, reviewed with pulmonary.  HRCT in 9/25 with no evidence for ILD. Bubble study suggestive of small PFO. HIV, ANA, SCL-70, anti-centromere Ab negative.  Repeat RHC in 9/25 after diuresis showed normally filling pressures, severe PAH, and CI improved 2.72.  No step-up in oxygen  saturation from SVC to PA (no significant shunt lesion).  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  She is a heterozygote for Factor V Leiden.  On exam today, she is not volume overloaded.  NYHA class II symptoms.  - 6 minute walk done today as above.  - Continue riociguat, currently on 0.5 tid and planning to titrate up.  - There is some data for macitentan in group 4 PH, wlll try to get this started.  - Given findings on V/Q scan, will refer to [facility] CTEPH program for evaluation.  2. Chronic diastolic CHF: Elevated PCWP in setting of normal EF on initial RHC in 9/25.  She diuresed well, repeat RHC 9/25 showed  normal filling pressures. She looks euvolemic on exam.  - Continue Torsemide 20 mg daily - Continue Farxiga  10 mg daily.   - BMET/BNP today.  3. CAD: H/o anterior MI.  LHC in 9/25 with patent LAD stent, nonobstructive disease.  - Stopped ASA with apixaban use.  - Continue statin, check lipids today.  4. OSA: Continue CPAP.  5. Active smoker: Prior PFTs not consistent with COPD and emphysema not noted on CTA chest.   - Discussed smoking cessation. Cutting back slowly 6. Type 2 diabetes: per primary. 7. Carotid stenosis: 50-69% R ICA stenosis and ~ 50% left ICA stenosis on carotid duplex 10/25.  - Repeat carotids in 1 year.  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 Reviewed with pulmonary.  She has no h/o clots.  Venous dopplers negative for DVT and CTA chest did not show acute PE. She is a heterozygote for Factor V Leiden.  - Continue Eliquis.  9. HTN: BP has been running lower recently, decrease amlodipine to 2.5 mg daily.   Follow up 6 wks  I spent 42 minutes reviewing records, interviewing/examining patient, and managing orders.    Ezra Shuck 11/30/2023

## 2023-12-02 ENCOUNTER — Telehealth (HOSPITAL_COMMUNITY): Payer: Self-pay

## 2023-12-02 ENCOUNTER — Other Ambulatory Visit (HOSPITAL_COMMUNITY): Payer: Self-pay

## 2023-12-02 NOTE — Telephone Encounter (Signed)
 Advanced Heart Failure Patient Advocate Encounter  Attempted to contact patient for consent to [employer] new start forms. No answer, left voicemail.  Rachel DEL, CPhT Rx Patient Advocate Phone: [phone]

## 2023-12-02 NOTE — Telephone Encounter (Signed)
 Obtained consent by phone, [employer] enrollment forms faxed on 12/02/2023.

## 2023-12-02 NOTE — Telephone Encounter (Signed)
 Prior authorization for [employer] has been submitted and approved. Test billing returns rejection - must be filled with specialty pharmacy. Unable to confirm copay at this time.  KeyBETHA PAO Effective: 12/01/2023 to 11/29/2024  Rachel DEL, CPhT Rx Patient Advocate Phone: [phone]

## 2023-12-05 ENCOUNTER — Telehealth (HOSPITAL_COMMUNITY): Payer: Self-pay

## 2023-12-05 NOTE — Telephone Encounter (Signed)
 Referral faxed to the CTEPH program at [facility] at [phone]

## 2023-12-05 NOTE — Telephone Encounter (Signed)
 Clarification requested. Forms updated and faxed on 12/05/23.

## 2023-12-05 NOTE — Telephone Encounter (Signed)
 Received notification from [employer] that [employer] Voucher supply was shipped on 12/05/2023

## 2023-12-06 ENCOUNTER — Telehealth: Payer: BC Managed Care – PPO | Admitting: Adult Health

## 2023-12-06 DIAGNOSIS — G4733 Obstructive sleep apnea (adult) (pediatric): Secondary | ICD-10-CM

## 2023-12-06 NOTE — Progress Notes (Signed)
 PATIENT: [name] DOB: 12-Sep-1955  REASON FOR VISIT: follow up HISTORY FROM: patient   Virtual Visit via Video Note  I connected with ANWITA MENCER on 12/06/23 at  1:00 PM EST by a video enabled telemedicine application located remotely at [facility] and verified that I am speaking with the correct person using two identifiers who was located at their own home.  Verified that the patient is currently in [place]    I discussed the limitations of evaluation and management by telemedicine and the availability of in person appointments. The patient expressed understanding and agreed to proceed.   PATIENT: [name] DOB: 1955/03/03  REASON FOR VISIT: follow up HISTORY FROM: patient  HISTORY OF PRESENT ILLNESS: Today 12/06/23:  [name] is a 68 y.o. female with a history of OSA on BiPAP. Returns today for follow-up. Reports that CPAP is working well.  She continues to wear the nasal mask.  She has 2 L of oxygen  bled into her BiPAP at night this is currently managed by pulmonology.  She denies any new issues.  She does state that she got a letter that she is due for new machine.  She would like to pursue this.  Her download is below.      11/26/22: [name] is a 68 y.o. female with a history of OSA on BiPAP. Returns today for follow-up.  Overall she reports that BiPAP is working well for her.  She does have 1 L of oxygen  bled into her BiPAP at night.  She states that the oxygen  concentrator is messing up.  She feels that she needs a replacement.  Her download is below       HISTORY   REVIEW OF SYSTEMS: Out of a complete 14 system review of symptoms, the patient complains only of the following symptoms, and all other reviewed systems are negative.  ALLERGIES: Allergies  Allergen Reactions   Codeine Nausea And Vomiting   Morphine Nausea And Vomiting   Sulfa Antibiotics Rash    HOME MEDICATIONS: Outpatient Medications Prior  to Visit  Medication Sig Dispense Refill   albuterol  (VENTOLIN  HFA) 108 (90 Base) MCG/ACT inhaler Inhale 2 puffs into the lungs every 6 (six) hours as needed for wheezing or shortness of breath.     amLODipine (NORVASC) 2.5 MG tablet Take 1 tablet (2.5 mg total) by mouth daily. 90 tablet 3   apixaban (ELIQUIS) 5 MG TABS tablet Take 1 tablet (5 mg total) by mouth 2 (two) times daily. 180 tablet 3   atorvastatin  (LIPITOR ) 40 MG tablet TAKE 1 TABLET (40 MG TOTAL) BY MOUTH EVERY OTHER DAY. TAKE AT DINNER 45 tablet 1   budesonide -formoterol  (BREYNA ) 80-4.5 MCG/ACT inhaler Inhale 2 puffs into the lungs 2 (two) times daily.     Continuous Glucose Sensor (FREESTYLE LIBRE 3 PLUS SENSOR) MISC Change sensor every 15 days. 6 each 3   escitalopram  (LEXAPRO ) 20 MG tablet TAKE 1 TABLET (20 MG TOTAL) BY MOUTH DAILY AT 6 (SIX) AM. 90 tablet 1   FARXIGA  10 MG TABS tablet TAKE 1 TABLET BY MOUTH EVERY DAY 90 tablet 0   metFORMIN  (GLUCOPHAGE ) 1000 MG tablet Take 1,000 mg by mouth 2 (two) times daily with a meal. 10/25/23 This Medication is on pause and confirmed by PCP - Dr. Suzzane Blanch and will discuss at hospital follow up appointment.     nystatin  cream (MYCOSTATIN ) Apply 1 Application topically as needed for dry skin.  Riociguat (ADEMPAS) 0.5 MG TABS Take 0.5 mg by mouth 3 (three) times daily.     tirzepatide  (MOUNJARO ) 5 MG/0.5ML Pen Inject 5 mg into the skin once a week. 6 mL 1   torsemide (DEMADEX) 20 MG tablet Take 1 tablet (20 mg total) by mouth daily. 90 tablet 3   No facility-administered medications prior to visit.    PAST MEDICAL HISTORY: Past Medical History:  Diagnosis Date   Allergy    Codeine, Sulfur   Anxiety    Aortic stenosis    Asthma 01/26/2023   CAD (coronary artery disease)    a. 10/2014: Ant STEMI s/p DES to dLAD   Carotid disease, bilateral    a. Duplex 12/2014: mild-mod atherosclerotic plaque without hemodynamically significant stenosis.   Cataract 04/08/2016   CHF (congestive  heart failure) (HCC) 08/27/2018   COPD (chronic obstructive pulmonary disease) (HCC)    Essential hypertension    Heart murmur 11/17/2014   HLD (hyperlipidemia)    Morbid obesity (HCC)    Myocardial infarction (HCC) 11/17/2014   Oxygen  deficiency 1996   1 LITER AT BEDTIME WITH BIPAP   PONV (postoperative nausea and vomiting)    Shingles    Sleep apnea    Uses CPAP   Type 2 diabetes mellitus (HCC)     PAST SURGICAL HISTORY: Past Surgical History:  Procedure Laterality Date   BREAST BIOPSY Left    2016   CARDIAC CATHETERIZATION N/A 11/17/2014   Procedure: Left Heart Cath and Coronary Angiography;  Surgeon: Deatrice DELENA Cage, MD;  Location: [facility];  Service: Cardiovascular;  Laterality: N/A;   CARDIAC CATHETERIZATION N/A 11/17/2014   Procedure: Coronary Stent Intervention;  Surgeon: Deatrice DELENA Cage, MD;  Location: [facility];  Service: Cardiovascular;  Laterality: N/A;  distal lad 2.25x20 promus   CATARACT EXTRACTION W/PHACO Left 09/20/2016   Procedure: CATARACT EXTRACTION PHACO AND INTRAOCULAR LENS PLACEMENT (IOC);  Surgeon: Perley Hamilton, MD;  Location: [facility];  Service: Ophthalmology;  Laterality: Left;  CDE: 9.27   CATARACT EXTRACTION W/PHACO Right 10/18/2016   Procedure: CATARACT EXTRACTION PHACO AND INTRAOCULAR LENS PLACEMENT (IOC);  Surgeon: Perley Hamilton, MD;  Location: [facility];  Service: Ophthalmology;  Laterality: Right;  CDE: 6.38   CHOLECYSTECTOMY     COLONOSCOPY WITH PROPOFOL  N/A 02/16/2021   Procedure: COLONOSCOPY WITH PROPOFOL ;  Surgeon: Cindie Carlin POUR, DO;  Location: [facility];  Service: Endoscopy;  Laterality: N/A;  1:30 / ASA 3   EYE SURGERY     POLYPECTOMY  02/16/2021   Procedure: POLYPECTOMY;  Surgeon: Cindie Carlin POUR, DO;  Location: [facility];  Service: Endoscopy;;   RIGHT HEART CATH N/A 10/24/2023   Procedure: RIGHT HEART CATH;  Surgeon: Rolan Ezra RAMAN, MD;  Location: [facility] INVASIVE CV LAB;  Service: Cardiovascular;  Laterality: N/A;    RIGHT/LEFT HEART CATH AND CORONARY ANGIOGRAPHY N/A 10/20/2023   Procedure: RIGHT/LEFT HEART CATH AND CORONARY ANGIOGRAPHY;  Surgeon: Wendel Lurena POUR, MD;  Location: [facility];  Service: Cardiovascular;  Laterality: N/A;   SPINAL FUSION     cervical; screws and plates.   TOTAL ABDOMINAL HYSTERECTOMY  1980    FAMILY HISTORY: Family History  Problem Relation Age of Onset   Heart attack Father    Heart disease Father    Cancer Mother    Heart disease Mother    Cancer Brother    Cancer Maternal Grandfather    Cancer Maternal Grandmother    Cancer Maternal Aunt  Cancer Maternal Aunt    Cancer Maternal Aunt    Cancer Maternal Aunt     SOCIAL HISTORY: Social History   Socioeconomic History   Marital status: Widowed    Spouse name: Not on file   Number of children: Not on file   Years of education: Not on file   Highest education level: Associate degree: occupational, scientist, product/process development, or vocational program  Occupational History   Occupation: Office work for [employer]  Tobacco Use   Smoking status: Every Day    Current packs/day: 1.00    Average packs/day: 1 pack/day for 50.9 years (50.9 ttl pk-yrs)    Types: Cigarettes    Start date: 12/26/1972   Smokeless tobacco: Never   Tobacco comments:    3 cigs per day currently 11/07/23  Vaping Use   Vaping status: Never Used  Substance and Sexual Activity   Alcohol  use: No    Alcohol /week: 0.0 standard drinks of alcohol    Drug use: No   Sexual activity: Yes    Partners: Male    Birth control/protection: Surgical  Other Topics Concern   Not on file  Social History Narrative   Not on file   Social Drivers of Health   Financial Resource Strain: Low Risk  (08/06/2023)   Overall Financial Resource Strain (CARDIA)    Difficulty of Paying Living Expenses: Not hard at all  Food Insecurity: No Food Insecurity (10/25/2023)   Hunger Vital Sign    Worried About Running Out of Food in the Last Year: Never true    Ran  Out of Food in the Last Year: Never true  Transportation Needs: No Transportation Needs (10/25/2023)   PRAPARE - Administrator, Civil Service (Medical): No    Lack of Transportation (Non-Medical): No  Physical Activity: Insufficiently Active (08/06/2023)   Exercise Vital Sign    Days of Exercise per Week: 2 days    Minutes of Exercise per Session: 30 min  Stress: No Stress Concern Present (08/06/2023)   [employer] of Occupational Health - Occupational Stress Questionnaire    Feeling of Stress: Not at all  Social Connections: Moderately Integrated (08/06/2023)   Social Connection and Isolation Panel    Frequency of Communication with Friends and Family: More than three times a week    Frequency of Social Gatherings with Friends and Family: More than three times a week    Attends Religious Services: More than 4 times per year    Active Member of [employer] or Organizations: Yes    Attends Banker Meetings: More than 4 times per year    Marital Status: Widowed  Intimate Partner Violence: Not At Risk (10/25/2023)   Humiliation, Afraid, Rape, and Kick questionnaire    Fear of Current or Ex-Partner: No    Emotionally Abused: No    Physically Abused: No    Sexually Abused: No      PHYSICAL EXAM Generalized: Well developed, in no acute distress   Neurological examination  Mentation: Alert oriented to time, place, history taking. Follows all commands speech and language fluent Cranial nerve II-XII: Facial symmetry noted  DIAGNOSTIC DATA (LABS, IMAGING, TESTING) - I reviewed patient records, labs, notes, testing and imaging myself where available.  Lab Results  Component Value Date   WBC 6.7 10/23/2023   HGB 16.0 (H) 10/24/2023   HCT 47.0 (H) 10/24/2023   MCV 89.6 10/23/2023   PLT 257 10/23/2023      Component Value Date/Time   NA 139  11/29/2023 1610   NA 143 08/10/2023 1540   K 4.2 11/29/2023 1610   CL 99 11/29/2023 1610   CO2 23 11/29/2023 1610    GLUCOSE 120 (H) 11/29/2023 1610   BUN 26 (H) 11/29/2023 1610   BUN 22 08/10/2023 1540   CREATININE 1.03 (H) 11/29/2023 1610   CREATININE 0.81 01/14/2015 1557   CALCIUM  9.1 11/29/2023 1610   PROT 6.9 10/18/2023 1033   PROT 7.2 08/10/2023 1540   ALBUMIN  3.3 (L) 10/18/2023 1033   ALBUMIN  4.3 08/10/2023 1540   AST 26 10/18/2023 1033   ALT 25 10/18/2023 1033   ALKPHOS 85 10/18/2023 1033   BILITOT 0.5 10/18/2023 1033   BILITOT 0.4 08/10/2023 1540   GFRNONAA 60 (L) 11/29/2023 1610   GFRAA >60 10/27/2019 1945   Lab Results  Component Value Date   CHOL 103 11/29/2023   HDL 44 11/29/2023   LDLCALC 33 11/29/2023   TRIG 131 11/29/2023   CHOLHDL 2.3 11/29/2023   Lab Results  Component Value Date   HGBA1C 7.3 (H) 10/19/2023   Lab Results  Component Value Date   VITAMINB12 396 01/14/2023   Lab Results  Component Value Date   TSH 4.819 (H) 10/22/2023      ASSESSMENT AND PLAN 68 y.o. year old female  has a past medical history of Allergy, Anxiety, Aortic stenosis, Asthma (01/26/2023), CAD (coronary artery disease), Carotid disease, bilateral, Cataract (04/08/2016), CHF (congestive heart failure) (HCC) (08/27/2018), COPD (chronic obstructive pulmonary disease) (HCC), Essential hypertension, Heart murmur (11/17/2014), HLD (hyperlipidemia), Morbid obesity (HCC), Myocardial infarction (HCC) (11/17/2014), Oxygen  deficiency (1996), PONV (postoperative nausea and vomiting), Shingles, Sleep apnea, and Type 2 diabetes mellitus (HCC). here with:  OSA on BiPAP  BiPAP compliance excellent Residual AHI is good Encouraged patient to continue using BiPAP nightly and > 4 hours each night Home sleep test ordered pending results will order new BiPAP machine F/U in 1 year or sooner if needed    Duwaine Russell, MSN, NP-C 12/06/2023, 12:44 PM [facility] [address], [address] [place], [place] [address] [phone]  The patient's condition requires frequent monitoring and  adjustments in the treatment plan, reflecting the ongoing complexity of care.  This provider is the continuing focal point for all needed services for this condition.

## 2023-12-06 NOTE — Patient Instructions (Signed)
 Continue using BiPAP nightly and greater than 4 hours each night Home sleep test If your symptoms worsen or you develop new symptoms please let us  know.

## 2023-12-13 ENCOUNTER — Other Ambulatory Visit (HOSPITAL_COMMUNITY): Payer: Self-pay | Admitting: Cardiology

## 2023-12-13 MED ORDER — OPSUMIT 10 MG PO TABS: 10.0000 mg | ORAL_TABLET | Freq: Every day | ORAL | 3 refills | Status: AC

## 2023-12-15 DIAGNOSIS — G4733 Obstructive sleep apnea (adult) (pediatric): Secondary | ICD-10-CM | POA: Diagnosis not present

## 2023-12-15 DIAGNOSIS — J449 Chronic obstructive pulmonary disease, unspecified: Secondary | ICD-10-CM | POA: Diagnosis not present

## 2023-12-20 DIAGNOSIS — I10 Essential (primary) hypertension: Secondary | ICD-10-CM | POA: Diagnosis not present

## 2023-12-20 DIAGNOSIS — E1165 Type 2 diabetes mellitus with hyperglycemia: Secondary | ICD-10-CM | POA: Diagnosis not present

## 2023-12-20 DIAGNOSIS — J4489 Other specified chronic obstructive pulmonary disease: Secondary | ICD-10-CM | POA: Diagnosis not present

## 2023-12-20 DIAGNOSIS — E782 Mixed hyperlipidemia: Secondary | ICD-10-CM | POA: Diagnosis not present

## 2023-12-20 DIAGNOSIS — R7989 Other specified abnormal findings of blood chemistry: Secondary | ICD-10-CM | POA: Diagnosis not present

## 2023-12-21 LAB — CBC WITH DIFFERENTIAL/PLATELET
Basophils Absolute: 0 x10E3/uL (ref 0.0–0.2)
Basos: 0 %
EOS (ABSOLUTE): 0.1 x10E3/uL (ref 0.0–0.4)
Eos: 1 %
Hematocrit: 46.1 % (ref 34.0–46.6)
Hemoglobin: 15.1 g/dL (ref 11.1–15.9)
Immature Grans (Abs): 0 x10E3/uL (ref 0.0–0.1)
Immature Granulocytes: 0 %
Lymphocytes Absolute: 2.2 x10E3/uL (ref 0.7–3.1)
Lymphs: 31 %
MCH: 30 pg (ref 26.6–33.0)
MCHC: 32.8 g/dL (ref 31.5–35.7)
MCV: 92 fL (ref 79–97)
Monocytes Absolute: 0.4 x10E3/uL (ref 0.1–0.9)
Monocytes: 6 %
Neutrophils Absolute: 4.4 x10E3/uL (ref 1.4–7.0)
Neutrophils: 62 %
Platelets: 266 x10E3/uL (ref 150–450)
RBC: 5.03 x10E6/uL (ref 3.77–5.28)
RDW: 15.2 % (ref 11.7–15.4)
WBC: 7.2 x10E3/uL (ref 3.4–10.8)

## 2023-12-21 LAB — LIPID PANEL
Chol/HDL Ratio: 2.6 ratio (ref 0.0–4.4)
Cholesterol, Total: 138 mg/dL (ref 100–199)
HDL: 53 mg/dL (ref >39)
LDL Chol Calc (NIH): 60 mg/dL (ref 0–99)
Triglycerides: 149 mg/dL (ref 0–149)
VLDL Cholesterol Cal: 25 mg/dL (ref 5–40)

## 2023-12-21 LAB — CMP14+EGFR
ALT: 17 IU/L (ref 0–32)
AST: 21 IU/L (ref 0–40)
Albumin: 4.7 g/dL (ref 3.9–4.9)
Alkaline Phosphatase: 82 IU/L (ref 49–135)
BUN/Creatinine Ratio: 22 (ref 12–28)
BUN: 20 mg/dL (ref 8–27)
Bilirubin Total: 0.4 mg/dL (ref 0.0–1.2)
CO2: 22 mmol/L (ref 20–29)
Calcium: 10 mg/dL (ref 8.7–10.3)
Chloride: 100 mmol/L (ref 96–106)
Creatinine, Ser: 0.89 mg/dL (ref 0.57–1.00)
Globulin, Total: 3.1 g/dL (ref 1.5–4.5)
Glucose: 96 mg/dL (ref 70–99)
Potassium: 4.3 mmol/L (ref 3.5–5.2)
Sodium: 140 mmol/L (ref 134–144)
Total Protein: 7.8 g/dL (ref 6.0–8.5)
eGFR: 71 mL/min/1.73 (ref >59)

## 2023-12-21 LAB — HEMOGLOBIN A1C
Est. average glucose Bld gHb Est-mCnc: 183 mg/dL
Hgb A1c MFr Bld: 8 % — ABNORMAL HIGH (ref 4.8–5.6)

## 2023-12-21 LAB — TSH+FREE T4
Free T4: 1.37 ng/dL (ref 0.82–1.77)
TSH: 2.68 u[IU]/mL (ref 0.450–4.500)

## 2023-12-28 ENCOUNTER — Ambulatory Visit: Admitting: Physician Assistant

## 2023-12-29 ENCOUNTER — Telehealth: Payer: Self-pay | Admitting: Adult Health

## 2023-12-29 NOTE — Telephone Encounter (Signed)
 HST BC[employer] pending

## 2024-01-03 ENCOUNTER — Other Ambulatory Visit: Payer: Self-pay | Admitting: Internal Medicine

## 2024-01-09 DIAGNOSIS — Z720 Tobacco use: Secondary | ICD-10-CM | POA: Diagnosis not present

## 2024-01-09 DIAGNOSIS — I2724 Chronic thromboembolic pulmonary hypertension: Secondary | ICD-10-CM | POA: Diagnosis not present

## 2024-01-09 NOTE — Telephone Encounter (Signed)
[employer]BS denied the HST.

## 2024-01-10 NOTE — Addendum Note (Signed)
 Addended by: SHERRYL DUWAINE SQUIBB on: 01/10/2024 12:57 PM   Modules accepted: Orders

## 2024-01-10 NOTE — Telephone Encounter (Addendum)
 Spoke to patient aware that insurance didn't approve HST Pt aware new cpap machine order was sent to [facility] today Pt states will call back when she receives new cpap machine to schedule initial cpap f/u visit

## 2024-01-10 NOTE — Telephone Encounter (Signed)
 Please let patient know that insurance would not approve HST. I have ordered new machine.

## 2024-01-14 DIAGNOSIS — J449 Chronic obstructive pulmonary disease, unspecified: Secondary | ICD-10-CM | POA: Diagnosis not present

## 2024-01-14 DIAGNOSIS — G4733 Obstructive sleep apnea (adult) (pediatric): Secondary | ICD-10-CM | POA: Diagnosis not present

## 2024-01-20 ENCOUNTER — Ambulatory Visit: Admitting: Internal Medicine

## 2024-01-20 ENCOUNTER — Encounter: Payer: Self-pay | Admitting: Internal Medicine

## 2024-01-20 VITALS — BP 103/64 | HR 79 | Ht 64.0 in | Wt 190.0 lb

## 2024-01-20 DIAGNOSIS — E1165 Type 2 diabetes mellitus with hyperglycemia: Secondary | ICD-10-CM | POA: Diagnosis not present

## 2024-01-20 DIAGNOSIS — Z7985 Long-term (current) use of injectable non-insulin antidiabetic drugs: Secondary | ICD-10-CM | POA: Diagnosis not present

## 2024-01-20 DIAGNOSIS — I10 Essential (primary) hypertension: Secondary | ICD-10-CM | POA: Diagnosis not present

## 2024-01-20 DIAGNOSIS — I2721 Secondary pulmonary arterial hypertension: Secondary | ICD-10-CM | POA: Diagnosis not present

## 2024-01-20 DIAGNOSIS — Z23 Encounter for immunization: Secondary | ICD-10-CM | POA: Diagnosis not present

## 2024-01-20 DIAGNOSIS — Z7984 Long term (current) use of oral hypoglycemic drugs: Secondary | ICD-10-CM

## 2024-01-20 DIAGNOSIS — Z0001 Encounter for general adult medical examination with abnormal findings: Secondary | ICD-10-CM

## 2024-01-20 DIAGNOSIS — I5032 Chronic diastolic (congestive) heart failure: Secondary | ICD-10-CM | POA: Diagnosis not present

## 2024-01-20 LAB — HEMOGLOBIN A1C: Hemoglobin A1C: 6.9

## 2024-01-20 MED ORDER — METFORMIN HCL 1000 MG PO TABS: 1000.0000 mg | ORAL_TABLET | Freq: Two times a day (BID) | ORAL | 3 refills | Status: AC

## 2024-01-20 MED ORDER — DAPAGLIFLOZIN PROPANEDIOL 10 MG PO TABS: 10.0000 mg | ORAL_TABLET | Freq: Every day | ORAL | 3 refills | Status: AC

## 2024-01-20 NOTE — Assessment & Plan Note (Signed)
 BP Readings from Last 1 Encounters:  01/20/24 103/64   Well-controlled with amlodipine  2.5 mg QD and torsemide  20 mg QD Was on valsartan  40 mg QD, metoprolol  37.5 mg QD and Imdur  30 mg QD - recently discontinued due to tightly controlled BP during hospitalization Counseled for compliance with the medications Advised DASH diet and moderate exercise/walking, at least 150 mins/week

## 2024-01-20 NOTE — Assessment & Plan Note (Signed)
 Recent hospital chart reviewed On torsemide 20 mg QD On Farxiga  10 mg QD Was on ARB, metoprolol  and Imdur  - discontinued during recent hospitalization due to low BP Appears euvolemic currently Followed by CHF clinic now

## 2024-01-20 NOTE — Patient Instructions (Addendum)
Please continue to take medications as prescribed.  Please continue to follow low carb diet and perform moderate exercise/walking at least 150 mins/week.  Please get blood tests done before the next visit.

## 2024-01-20 NOTE — Assessment & Plan Note (Signed)
 Physical exam as documented. Fasting blood tests reviewed today. Shingrix  #2 vaccine today.

## 2024-01-20 NOTE — Assessment & Plan Note (Signed)
 [facility] (09/25) noted elevated right and left heart pressures mean PAP 64, PVR 10 CTA chest noted no significant parenchymal lung disease, she is a former smoker, VQ scan was concerning for chronic PE, bubble study suggestive of PFO, serologies negative - Improved with diuresis, on Adempas  and Opsumit  F/u with CHF clinic and [facility] program

## 2024-01-20 NOTE — Progress Notes (Signed)
 "  Established Patient Office Visit  Subjective:  Patient ID: [name], female    DOB: November 22, 1955  Age: 68 y.o. MRN: [id number]  CC:  Chief Complaint  Patient presents with   Annual Exam    Cpe, needs refills on dm medications    HPI [name] is a 68 y.o. female with past medical history of CAD s/p stent placement, HTN, OSA, asthmatic bronchitis, type II DM, GERD, HLD and GAD who presents for annual physical.  CAD, HTN: She has history of stent placement for STEMI.  She is on aspirin  and atorvastatin  40 mg QD.    She also takes torsemide  20 mg QD due to history of acute CHF, pulmonary hypertension and chronic leg swelling.  She was recently placed on amlodipine  2.5 mg QD by cardiology, but her BP is low normal today.  Denies any headache, dizziness, chest pain, dyspnea or palpitations.  Pulmonary hypertension: Followed by CHF clinic currently.  She was also evaluated by CTEPH clinic at [facility].  She is on Opsumit  and Adempas  now.  She uses BiPAP regularly for OSA.  Followed by pulmonology.  Type II DM: Her last HbA1c was 8.0 in 11/25, but had stopped taking Mounjaro  at the time.  She has started taking lower dose of Mounjaro  5 mg again. she takes metformin  1000 mg twice daily and Farxiga  10 mg daily.  Denies polyuria or polyphagia. She has CGM, her GMI is 6.9 now.      Past Medical History:  Diagnosis Date   Allergy    Codeine, Sulfur   Anxiety    Aortic stenosis    Asthma 01/26/2023   CAD (coronary artery disease)    a. 10/2014: Ant STEMI s/p DES to dLAD   Carotid disease, bilateral    a. Duplex 12/2014: mild-mod atherosclerotic plaque without hemodynamically significant stenosis.   Cataract 04/08/2016   CHF (congestive heart failure) (HCC) 08/27/2018   COPD (chronic obstructive pulmonary disease) (HCC)    Essential hypertension    Heart murmur 11/17/2014   HLD (hyperlipidemia)    Morbid obesity (HCC)    Myocardial infarction (HCC) 11/17/2014   Oxygen   deficiency 1996   1 LITER AT BEDTIME WITH BIPAP   PONV (postoperative nausea and vomiting)    Shingles    Sleep apnea    Uses CPAP   Type 2 diabetes mellitus [facility])     Past Surgical History:  Procedure Laterality Date   BREAST BIOPSY Left    2016   CARDIAC CATHETERIZATION N/A 11/17/2014   Procedure: Left Heart Cath and Coronary Angiography;  Surgeon: Deatrice DELENA Cage, MD;  Location: [facility] INVASIVE CV LAB;  Service: Cardiovascular;  Laterality: N/A;   CARDIAC CATHETERIZATION N/A 11/17/2014   Procedure: Coronary Stent Intervention;  Surgeon: Deatrice DELENA Cage, MD;  Location: [facility] INVASIVE CV LAB;  Service: Cardiovascular;  Laterality: N/A;  distal lad 2.25x20 promus   CATARACT EXTRACTION W/PHACO Left 09/20/2016   Procedure: CATARACT EXTRACTION PHACO AND INTRAOCULAR LENS PLACEMENT (IOC);  Surgeon: Perley Hamilton, MD;  Location: [facility] ORS;  Service: Ophthalmology;  Laterality: Left;  CDE: 9.27   CATARACT EXTRACTION W/PHACO Right 10/18/2016   Procedure: CATARACT EXTRACTION PHACO AND INTRAOCULAR LENS PLACEMENT (IOC);  Surgeon: Perley Hamilton, MD;  Location: [facility] ORS;  Service: Ophthalmology;  Laterality: Right;  CDE: 6.38   CHOLECYSTECTOMY     COLONOSCOPY WITH PROPOFOL  N/A 02/16/2021   Procedure: COLONOSCOPY WITH PROPOFOL ;  Surgeon: Cindie Carlin POUR, DO;  Location: [facility] ENDO SUITE;  Service: Endoscopy;  Laterality: N/A;  1:30 / ASA 3   EYE SURGERY     POLYPECTOMY  02/16/2021   Procedure: POLYPECTOMY;  Surgeon: Cindie Carlin POUR, DO;  Location: [facility] ENDO SUITE;  Service: Endoscopy;;   RIGHT HEART CATH N/A 10/24/2023   Procedure: RIGHT HEART CATH;  Surgeon: Rolan Ezra RAMAN, MD;  Location: [facility] INVASIVE CV LAB;  Service: Cardiovascular;  Laterality: N/A;   RIGHT/LEFT HEART CATH AND CORONARY ANGIOGRAPHY N/A 10/20/2023   Procedure: RIGHT/LEFT HEART CATH AND CORONARY ANGIOGRAPHY;  Surgeon: Wendel Lurena POUR, MD;  Location: [facility] INVASIVE CV LAB;  Service: Cardiovascular;  Laterality: N/A;   SPINAL FUSION     cervical;  screws and plates.   TOTAL ABDOMINAL HYSTERECTOMY  1980    Family History  Problem Relation Age of Onset   Heart attack Father    Heart disease Father    Cancer Mother    Heart disease Mother    Cancer Brother    Cancer Maternal Grandfather    Cancer Maternal Grandmother    Cancer Maternal Aunt    Cancer Maternal Aunt    Cancer Maternal Aunt    Cancer Maternal Aunt     Social History   Socioeconomic History   Marital status: Widowed    Spouse name: Not on file   Number of children: Not on file   Years of education: Not on file   Highest education level: Associate degree: occupational, scientist, product/process development, or vocational program  Occupational History   Occupation: Office work for [employer]  Tobacco Use   Smoking status: Every Day    Current packs/day: 1.00    Average packs/day: 1 pack/day for 51.1 years (51.1 ttl pk-yrs)    Types: Cigarettes    Start date: 12/26/1972   Smokeless tobacco: Never   Tobacco comments:    3 cigs per day currently 11/07/23  Vaping Use   Vaping status: Never Used  Substance and Sexual Activity   Alcohol  use: No    Alcohol /week: 0.0 standard drinks of alcohol    Drug use: No   Sexual activity: Yes    Partners: Male    Birth control/protection: Surgical  Other Topics Concern   Not on file  Social History Narrative   Not on file   Social Drivers of Health   Tobacco Use: High Risk (01/20/2024)   Patient History    Smoking Tobacco Use: Every Day    Smokeless Tobacco Use: Never    Passive Exposure: Not on file  Financial Resource Strain: Low Risk (01/16/2024)   Overall Financial Resource Strain (CARDIA)    Difficulty of Paying Living Expenses: Not very hard  Food Insecurity: No Food Insecurity (01/16/2024)   Epic    Worried About Radiation Protection Practitioner of Food in the Last Year: Never true    Ran Out of Food in the Last Year: Never true  Transportation Needs: No Transportation Needs (01/16/2024)   Epic    Lack of Transportation (Medical): No     Lack of Transportation (Non-Medical): No  Physical Activity: Insufficiently Active (01/16/2024)   Exercise Vital Sign    Days of Exercise per Week: 2 days    Minutes of Exercise per Session: 30 min  Stress: No Stress Concern Present (01/16/2024)   [employer] of Occupational Health - Occupational Stress Questionnaire    Feeling of Stress: Not at all  Social Connections: Moderately Isolated (01/16/2024)   Social Connection and Isolation Panel    Frequency of Communication with Friends and Family: More than three times a  week    Frequency of Social Gatherings with Friends and Family: Once a week    Attends Religious Services: 1 to 4 times per year    Active Member of [employer] or Organizations: No    Attends Banker Meetings: Not on file    Marital Status: Widowed  Intimate Partner Violence: Not At Risk (10/25/2023)   Epic    Fear of Current or Ex-Partner: No    Emotionally Abused: No    Physically Abused: No    Sexually Abused: No  Depression (PHQ2-9): Low Risk (01/20/2024)   Depression (PHQ2-9)    PHQ-2 Score: 0  Alcohol  Screen: Not on file  Housing: Low Risk (01/16/2024)   Epic    Unable to Pay for Housing in the Last Year: No    Number of Times Moved in the Last Year: 0    Homeless in the Last Year: No  Utilities: Not At Risk (10/25/2023)   Epic    Threatened with loss of utilities: No  Health Literacy: Not on file    Outpatient Medications Prior to Visit  Medication Sig Dispense Refill   albuterol  (VENTOLIN  HFA) 108 (90 Base) MCG/ACT inhaler Inhale 2 puffs into the lungs every 6 (six) hours as needed for wheezing or shortness of breath.     amLODipine  (NORVASC ) 2.5 MG tablet Take 1 tablet (2.5 mg total) by mouth daily. 90 tablet 3   apixaban  (ELIQUIS ) 5 MG TABS tablet Take 1 tablet (5 mg total) by mouth 2 (two) times daily. 180 tablet 3   atorvastatin  (LIPITOR ) 40 MG tablet TAKE 1 TABLET (40 MG TOTAL) BY MOUTH EVERY OTHER DAY. TAKE AT DINNER 45 tablet 1    budesonide -formoterol  (BREYNA ) 80-4.5 MCG/ACT inhaler Inhale 2 puffs into the lungs 2 (two) times daily.     Continuous Glucose Sensor (FREESTYLE LIBRE 3 PLUS SENSOR) MISC Change sensor every 15 days. 6 each 3   escitalopram  (LEXAPRO ) 20 MG tablet TAKE 1 TABLET (20 MG TOTAL) BY MOUTH DAILY AT 6 (SIX) AM. 90 tablet 1   macitentan  (OPSUMIT ) 10 MG tablet Take 1 tablet (10 mg total) by mouth daily. 90 tablet 3   nystatin  cream (MYCOSTATIN ) Apply 1 Application topically as needed for dry skin.     Riociguat  (ADEMPAS ) 2 MG TABS Take 2 mg by mouth in the morning, at noon, and at bedtime.     tirzepatide  (MOUNJARO ) 5 MG/0.5ML Pen Inject 5 mg into the skin once a week. 6 mL 1   torsemide  (DEMADEX ) 20 MG tablet Take 1 tablet (20 mg total) by mouth daily. 90 tablet 3   FARXIGA  10 MG TABS tablet TAKE 1 TABLET BY MOUTH EVERY DAY 90 tablet 0   metFORMIN  (GLUCOPHAGE ) 1000 MG tablet Take 1,000 mg by mouth 2 (two) times daily with a meal. 10/25/23 This Medication is on pause and confirmed by PCP - Dr. Suzzane Blanch and will discuss at hospital follow up appointment.     Riociguat  (ADEMPAS ) 0.5 MG TABS Take 0.5 mg by mouth 3 (three) times daily.     No facility-administered medications prior to visit.    Allergies  Allergen Reactions   Codeine Nausea And Vomiting   Morphine Nausea And Vomiting   Sulfa Antibiotics Rash    ROS Review of Systems  Constitutional:  Negative for chills and fever.  HENT:  Negative for congestion, sinus pressure, sinus pain and sore throat.   Eyes:  Negative for pain and discharge.  Respiratory:  Negative for cough and  shortness of breath.   Cardiovascular:  Negative for chest pain and palpitations.  Gastrointestinal:  Negative for abdominal pain, diarrhea, nausea and vomiting.  Endocrine: Negative for polydipsia and polyuria.  Genitourinary:  Negative for dysuria and hematuria.  Musculoskeletal:  Negative for neck pain and neck stiffness.  Skin:  Negative for rash.   Neurological:  Negative for dizziness and weakness.  Psychiatric/Behavioral:  Negative for agitation and behavioral problems.       Objective:    Physical Exam Vitals reviewed.  Constitutional:      General: She is not in acute distress.    Appearance: She is obese. She is not diaphoretic.  HENT:     Head: Normocephalic and atraumatic.     Nose: Nose normal. No congestion.     Mouth/Throat:     Mouth: Mucous membranes are moist.     Pharynx: No posterior oropharyngeal erythema.  Eyes:     General: No scleral icterus.    Extraocular Movements: Extraocular movements intact.  Cardiovascular:     Rate and Rhythm: Normal rate and regular rhythm.     Heart sounds: Normal heart sounds. No murmur heard. Pulmonary:     Breath sounds: Normal breath sounds. No wheezing or rales.  Abdominal:     Palpations: Abdomen is soft.     Tenderness: There is no abdominal tenderness.  Musculoskeletal:     Cervical back: Neck supple. No tenderness.     Right lower leg: No edema.     Left lower leg: No edema.  Skin:    General: Skin is warm.     Findings: No rash.  Neurological:     General: No focal deficit present.     Mental Status: She is alert and oriented to person, place, and time.     Cranial Nerves: No cranial nerve deficit.     Sensory: No sensory deficit.     Motor: No weakness.  Psychiatric:        Mood and Affect: Mood normal.        Behavior: Behavior normal.     BP 103/64   Pulse 79   Ht 5' 4 (1.626 m)   Wt 190 lb (86.2 kg)   SpO2 95%   BMI 32.61 kg/m  Wt Readings from Last 3 Encounters:  01/20/24 190 lb (86.2 kg)  11/29/23 189 lb (85.7 kg)  11/10/23 188 lb 3.2 oz (85.4 kg)    Lab Results  Component Value Date   TSH 2.680 12/20/2023   Lab Results  Component Value Date   WBC 7.2 12/20/2023   HGB 15.1 12/20/2023   HCT 46.1 12/20/2023   MCV 92 12/20/2023   PLT 266 12/20/2023   Lab Results  Component Value Date   NA 140 12/20/2023   K 4.3 12/20/2023    CO2 22 12/20/2023   GLUCOSE 96 12/20/2023   BUN 20 12/20/2023   CREATININE 0.89 12/20/2023   BILITOT 0.4 12/20/2023   ALKPHOS 82 12/20/2023   AST 21 12/20/2023   ALT 17 12/20/2023   PROT 7.8 12/20/2023   ALBUMIN  4.7 12/20/2023   CALCIUM  10.0 12/20/2023   ANIONGAP 17 (H) 11/29/2023   EGFR 71 12/20/2023   Lab Results  Component Value Date   CHOL 138 12/20/2023   Lab Results  Component Value Date   HDL 53 12/20/2023   Lab Results  Component Value Date   LDLCALC 60 12/20/2023   Lab Results  Component Value Date   TRIG 149 12/20/2023  Lab Results  Component Value Date   CHOLHDL 2.6 12/20/2023   Lab Results  Component Value Date   HGBA1C 6.9 01/20/2024      Assessment & Plan:   Problem List Items Addressed This Visit       Cardiovascular and Mediastinum   Essential hypertension   BP Readings from Last 1 Encounters:  01/20/24 103/64   Well-controlled with amlodipine  2.5 mg QD and torsemide  20 mg QD Was on valsartan  40 mg QD, metoprolol  37.5 mg QD and Imdur  30 mg QD - recently discontinued due to tightly controlled BP during hospitalization Counseled for compliance with the medications Advised DASH diet and moderate exercise/walking, at least 150 mins/week      Relevant Medications   Riociguat  (ADEMPAS ) 2 MG TABS   Chronic diastolic CHF (congestive heart failure) (HCC)   Recent hospital chart reviewed On torsemide  20 mg QD On Farxiga  10 mg QD Was on ARB, metoprolol  and Imdur  - discontinued during recent hospitalization due to low BP Appears euvolemic currently Followed by CHF clinic now      Relevant Medications   Riociguat  (ADEMPAS ) 2 MG TABS   Pulmonary artery hypertension (HCC)   RHC (09/25) noted elevated right and left heart pressures mean PAP 64, PVR 10 CTA chest noted no significant parenchymal lung disease, she is a former smoker, VQ scan was concerning for chronic PE, bubble study suggestive of PFO, serologies negative - Improved with  diuresis, on Adempas  and Opsumit  F/u with CHF clinic and [facility] CTEPH program      Relevant Medications   Riociguat  (ADEMPAS ) 2 MG TABS     Endocrine   Type 2 diabetes mellitus with hyperglycemia (HCC)   Lab Results  Component Value Date   HGBA1C 6.9 01/20/2024   Was uncontrolled as she had stopped Mounjaro , recently started again Continue Metformin  1000 mg BID Reduced dose of Mounjaro  to 5 mg qw as she had stopped taking it due to GI side effects from higher doses On Farxiga  10 mg QD Advised to follow diabetic diet On statin F/u CMP and lipid panel Diabetic eye exam: Advised to follow up with Ophthalmology for diabetic eye exam      Relevant Medications   dapagliflozin  propanediol (FARXIGA ) 10 MG TABS tablet   metFORMIN  (GLUCOPHAGE ) 1000 MG tablet   Other Relevant Orders   Urine Microalbumin w/creat. ratio   CMP14+EGFR   Hemoglobin A1c     Other   Encounter for general adult medical examination with abnormal findings - Primary   Physical exam as documented. Fasting blood tests reviewed today. Shingrix  #2 vaccine today.      Other Visit Diagnoses       Encounter for immunization       Relevant Orders   Varicella-zoster vaccine IM (Completed)       Meds ordered this encounter  Medications   dapagliflozin  propanediol (FARXIGA ) 10 MG TABS tablet    Sig: Take 1 tablet (10 mg total) by mouth daily.    Dispense:  90 tablet    Refill:  3   metFORMIN  (GLUCOPHAGE ) 1000 MG tablet    Sig: Take 1 tablet (1,000 mg total) by mouth 2 (two) times daily with a meal.    Dispense:  180 tablet    Refill:  3    Follow-up: Return in about 3 months (around 04/19/2024) for DM.    Suzzane MARLA Blanch, MD "

## 2024-01-20 NOTE — Assessment & Plan Note (Addendum)
 Lab Results  Component Value Date   HGBA1C 6.9 01/20/2024   Was uncontrolled as she had stopped Mounjaro , recently started again Continue Metformin  1000 mg BID Reduced dose of Mounjaro  to 5 mg qw as she had stopped taking it due to GI side effects from higher doses On Farxiga  10 mg QD Advised to follow diabetic diet On statin F/u CMP and lipid panel Diabetic eye exam: Advised to follow up with Ophthalmology for diabetic eye exam

## 2024-01-22 LAB — MICROALBUMIN / CREATININE URINE RATIO
Creatinine, Urine: 24.8 mg/dL
Microalb/Creat Ratio: <12 mg/g{creat} (ref 0–29)
Microalbumin, Urine: <3 ug/mL

## 2024-01-23 ENCOUNTER — Ambulatory Visit (HOSPITAL_COMMUNITY): Payer: Self-pay | Admitting: Cardiology

## 2024-01-23 ENCOUNTER — Encounter (HOSPITAL_COMMUNITY): Payer: Self-pay | Admitting: Cardiology

## 2024-01-23 ENCOUNTER — Ambulatory Visit (HOSPITAL_COMMUNITY)
Admission: RE | Admit: 2024-01-23 | Discharge: 2024-01-23 | Disposition: A | Discharge status: Home / Self Care | Source: Ambulatory Visit | Attending: Cardiology | Admitting: Cardiology

## 2024-01-23 ENCOUNTER — Encounter: Admitting: Internal Medicine

## 2024-01-23 VITALS — BP 108/50 | HR 85 | Ht 64.0 in | Wt 190.4 lb

## 2024-01-23 DIAGNOSIS — I5032 Chronic diastolic (congestive) heart failure: Secondary | ICD-10-CM | POA: Diagnosis not present

## 2024-01-23 DIAGNOSIS — E669 Obesity, unspecified: Secondary | ICD-10-CM | POA: Insufficient documentation

## 2024-01-23 DIAGNOSIS — G4733 Obstructive sleep apnea (adult) (pediatric): Secondary | ICD-10-CM | POA: Insufficient documentation

## 2024-01-23 DIAGNOSIS — Z716 Tobacco abuse counseling: Secondary | ICD-10-CM | POA: Diagnosis not present

## 2024-01-23 DIAGNOSIS — E119 Type 2 diabetes mellitus without complications: Secondary | ICD-10-CM | POA: Insufficient documentation

## 2024-01-23 DIAGNOSIS — Z7984 Long term (current) use of oral hypoglycemic drugs: Secondary | ICD-10-CM | POA: Insufficient documentation

## 2024-01-23 DIAGNOSIS — Z955 Presence of coronary angioplasty implant and graft: Secondary | ICD-10-CM | POA: Insufficient documentation

## 2024-01-23 DIAGNOSIS — D6851 Activated protein C resistance: Secondary | ICD-10-CM | POA: Insufficient documentation

## 2024-01-23 DIAGNOSIS — R7989 Other specified abnormal findings of blood chemistry: Secondary | ICD-10-CM | POA: Diagnosis not present

## 2024-01-23 DIAGNOSIS — I272 Pulmonary hypertension, unspecified: Secondary | ICD-10-CM | POA: Insufficient documentation

## 2024-01-23 DIAGNOSIS — Z7985 Long-term (current) use of injectable non-insulin antidiabetic drugs: Secondary | ICD-10-CM | POA: Diagnosis not present

## 2024-01-23 DIAGNOSIS — Z7901 Long term (current) use of anticoagulants: Secondary | ICD-10-CM | POA: Insufficient documentation

## 2024-01-23 DIAGNOSIS — F17210 Nicotine dependence, cigarettes, uncomplicated: Secondary | ICD-10-CM | POA: Insufficient documentation

## 2024-01-23 DIAGNOSIS — I252 Old myocardial infarction: Secondary | ICD-10-CM | POA: Diagnosis not present

## 2024-01-23 DIAGNOSIS — I2721 Secondary pulmonary arterial hypertension: Secondary | ICD-10-CM | POA: Insufficient documentation

## 2024-01-23 DIAGNOSIS — I2510 Atherosclerotic heart disease of native coronary artery without angina pectoris: Secondary | ICD-10-CM | POA: Insufficient documentation

## 2024-01-23 DIAGNOSIS — R55 Syncope and collapse: Secondary | ICD-10-CM | POA: Insufficient documentation

## 2024-01-23 DIAGNOSIS — I110 Hypertensive heart disease with heart failure: Secondary | ICD-10-CM | POA: Diagnosis not present

## 2024-01-23 DIAGNOSIS — Z79899 Other long term (current) drug therapy: Secondary | ICD-10-CM | POA: Insufficient documentation

## 2024-01-23 LAB — BASIC METABOLIC PANEL WITH GFR
Anion gap: 18 — ABNORMAL HIGH (ref 5–15)
BUN: 30 mg/dL — ABNORMAL HIGH (ref 8–23)
CO2: 21 mmol/L — ABNORMAL LOW (ref 22–32)
Calcium: 9.6 mg/dL (ref 8.9–10.3)
Chloride: 99 mmol/L (ref 98–111)
Creatinine, Ser: 1.07 mg/dL — ABNORMAL HIGH (ref 0.44–1.00)
GFR, Estimated: 56 mL/min — ABNORMAL LOW
Glucose, Bld: 90 mg/dL (ref 70–99)
Potassium: 4.6 mmol/L (ref 3.5–5.1)
Sodium: 138 mmol/L (ref 135–145)

## 2024-01-23 MED ORDER — ATORVASTATIN CALCIUM 40 MG PO TABS: 40.0000 mg | ORAL_TABLET | Freq: Every day | ORAL | 1 refills | Status: AC

## 2024-01-23 NOTE — Progress Notes (Deleted)
" °  Cardiology Office Note:  .   Date:  01/23/2024  ID:  [name], DOB [birth year], MRN [id number] PCP: [name], MD  [facility] Providers Cardiologist:  Jayson Sierras, MD {  History of Present Illness: .   [name] is a 68 y.o. female  with PMHx of CAD, HLD, PSVT, mild AV stenosis, HTN, pulmonary hypertension w/ associated RV dysfunction, Carotid Artery stenosis, severe OSA on CPAP, syncope, DM2, chronic asthmatic bronchitis/smoking (followed by Dr. Darlean)  who reports to [facility] office for follow up.   Pertinent cardiac medical history:  Severe OSA  2020 sleep study CAD  s/p anterior STEMI with DES to LAD in 2016 LHC (9/25): Nonobstructive CAD.  PSVT  Zio 2023: brief AV stenosis ECHO 10/2021: mild  Pulmonary hypertension/RV failure Echo 2023 with RVSP of 67 mmHg PFTs 12/24 c/w minimal obstruction, + restriction c/w body habitus and moderate diffusion defect.  Echo (9/25): LV EF 70-75%, D-shaped septum, mild RVE with mild RV systolic dysfunction, PASP 77 mmHg, mild AS mean gradient 10 mmHg, IVC dilated.  RHC (9/25): RA 22, PAP 114/35, PCW 26, PVR 11.2 WU, PAPi 3.6, CI 1.9 by TD, 1.7 by FICK Repeat RHC (9/25): RA 6, PA 86/28 mean 51, mean PCWP 13, CI 2.72 Fick, PVR 7.2 WU.  No evidence for step-up in oxygen  saturation from SVC to PA.  HRCT chest (9/25): Possible small airways disease, no evidence for ILD. No emphysema or bronchiectasis.  Followed by [facility]  Carotid Artery Stenosis Carotid US (10/25) with 50-69% RICA.  syncope   Recent hospitalization     Last seen in heartcare by HF 01/23/2024 by Dr. Rolan....     Today, reports ### and denies ###.  Denies chest pain, shortness of breath, palpitations, syncope, presyncope, dizziness, orthopnea, PND, swelling or significant weight changes, acute bleeding, or claudication.   Reports compliance with medications.  Dietary habitats:  Activity level:  Social: Denies tobacco use/alcohol /drug  use  Denies any recent hospitalizations or visits to the emergency department.   ROS: 10 point review of system has been reviewed and considered negative except ones been listed in the HPI.   Studies Reviewed: .        CV Studies: Cardiac studies reviewed are outlined and summarized above. Otherwise please see EMR for full report.   Risk Assessment/Calculations:   {Does this patient have ATRIAL FIBRILLATION?:[id number]} No BP recorded.  {Refresh Note OR Click here to enter BP  :1}***       Physical Exam:   VS:  There were no vitals taken for this visit.   Wt Readings from Last 3 Encounters:  01/20/24 190 lb (86.2 kg)  11/29/23 189 lb (85.7 kg)  11/10/23 188 lb 3.2 oz (85.4 kg)    GEN: Well nourished, well developed in no acute distress while sitting in chair.  NECK: No JVD; No carotid bruits CARDIAC: ***RRR, no murmurs, rubs, gallops RESPIRATORY:  Clear to auscultation without rales, wheezing or rhonchi  ABDOMEN: Soft, non-tender, non-distended EXTREMITIES:  No edema; No deformity   ASSESSMENT AND PLAN: .   ***    {Are you ordering a CV Procedure (e.g. stress test, cath, DCCV, TEE, etc)?   Press F2        :[id number]}  Dispo: ***  Signed, Lorette CINDERELLA Kapur, PA-C  "

## 2024-01-23 NOTE — Progress Notes (Signed)
 6 Min Walk Test Completed  Pt ambulated 881ft (259.19m) O2 Sat ranged 88-92 on 0 L oxygen  HR ranged 93-136  Patient had to take 2 short 30 second breaks

## 2024-01-23 NOTE — Patient Instructions (Addendum)
 Medication Changes:  INCREASE Atorvastatin  to 40 mg Daily  Lab Work:  Labs done today, your results will be available in MyChart, we will contact you for abnormal readings.  Referrals:  You have been referred to Pulmonary Rehab at [facility], they will call you to schedule  Special Instructions // Education:  Do the following things EVERYDAY: Weigh yourself in the morning before breakfast. Write it down and keep it in a log. Take your medicines as prescribed Eat low salt foods--Limit salt (sodium) to 2000 mg per day.  Stay as active as you can everyday Limit all fluids for the day to less than 2 liters   Follow-Up in: 2 months   At the [facility], you and your health needs are our priority. We have a designated team specialized in the treatment of Heart Failure. This Care Team includes your primary Heart Failure Specialized Cardiologist (physician), Advanced Practice Providers (APPs- Physician Assistants and Nurse Practitioners), and Pharmacist who all work together to provide you with the care you need, when you need it.   You may see any of the following providers on your designated Care Team at your next follow up:  Dr. Toribio Fuel Dr. Ezra Shuck Dr. Odis Brownie Greig Mosses, NP Caffie Shed, [place] [facility] [place], [place] Beckey Coe, NP Jordan Lee, NP Tinnie Redman, PharmD   Please be sure to bring in all your medications bottles to every appointment.   Need to Contact Us :  If you have any questions or concerns before your next appointment please send us  a message through Mosses or call our office at [phone].    TO LEAVE A MESSAGE FOR THE NURSE SELECT OPTION 2, PLEASE LEAVE A MESSAGE INCLUDING: YOUR NAME DATE OF BIRTH CALL BACK NUMBER REASON FOR CALL**this is important as we prioritize the call backs  YOU WILL RECEIVE A CALL BACK THE SAME DAY AS LONG AS YOU CALL BEFORE 4:00 PM

## 2024-01-23 NOTE — Progress Notes (Signed)
 PCP: [name], MD Cardiology: Dr. Rolan  Chief Complaint: CHF  HPI: [name] is a 68 y.o. female w/ history of chronic asthmatic bronchitis/smoking followed by Dr. Darlean, OSA on CPAP, obesity, pulmonary hypertension w/ associated RV dysfunction, CAD s/p anterior STEMI w/ DES to LAD in 2016, carotid stenosis.    Echo in 2022 showed normal LVEF 60-65, RV mildly enlarged, mildly reduced RV function, D-shaped interventricular septum, estimated RVSP 57 mmHg.    PFTs 12/24 c/w minimal obstruction, + restriction c/w body habitus and moderate diffusion defect.    Sent from pulmonary clinic to the ED in 9/25 given complaint of worsening SOB w/ minimal exertion and hypoxic respiratory failure as well as numerous near-syncopal spells.   Initial O2 sats were in the upper 80s. Had also endorsed recent travel to [facility] prompting PE evaluation. D-dimer was elevated at 3.12 but chest CTA negative for acute PE. Study did show dilation of main pulmonary artery suggesting underlying pulmonary hypertension.  V/Q scan was concerning for chronic PE.  Echo showed hyperdynamic LV function, EF 70-75%, GIIDD, interventricular septum flattened in systole and diastole consistent with right ventricular pressure and volume overload, RV mildly enlarged w/ mild-moderately reduced RV systolic function, estimated RVSP 77 mmHg, mild-mod TR, IVC dilated  with <50% respiratory variability suggesting right atrial pressure of 15 mmHg, mild AS with mean gradient 10 mmHg. LHC done showing mild, nonobstructive coronary artery disease with patent mid to distal LAD stent. 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 She was diuresed and started on sildenafil . Repeat RHC prior to discharge w/ normal filling pressures and persistent pulmonary arterial hypertension. She was found to be a Factor V Leiden heterozygote.   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  She was seen for CTEPH at [facility], they are going to review her V/Q scan then discuss case in multidisciplinary CTEPH conference.    She returns for followup of pulmonary hypertension/RV failure. She is back to working full time.  She now on riociguat  1.5 mg tid with plan to increase to 2 mg tid this week. Symptomatically doing well, says she can walk about 1/2 mile before she has to stop.  Working full time. No dyspnea with usual ADLs.  Using CPAP at night, not on oxygen .  Down to smoking 1-2 cigarettes every other day. Weight is stable.   6 minute walk (11/25): 297 m 6 minute walk (12/25): 269 m  ECG (personally reviewed): NSR, right axis deviation  Labs (9/25): HIV negative, prothrombin gene mutation negative, Factor V Leiden heterozygote, anti-cardiolipin negative, beta-2  glycoprotein negative, lupus anticoagulant negative, anti-centromere negative, anti-SCL 70 negative, ANA negative, RF negative Labs (10/25): BNP 27.5, K 4.2, creatinine 1.09  PMH: 1. HTN 2. OSA: CPAP 3. Venous thromboembolism: Concern for chronic PE by V/Q scan in 9/25.  CTA chest did not show acute PE, and lower extremity venous dopplers did not show acute DVT.  Patient found to be heterozygote for Factor V Leiden.  4. 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  - Echo (9/25): LV EF 70-75%, D-shaped septum, mild RVE with mild RV systolic dysfunction, PASP 77 mmHg, mild AS mean gradient 10 mmHg, IVC dilated.  - RHC (9/25): RA 22, PAP 114/35, PCW 26, PVR 11.2 WU, PAPi 3.6, CI 1.9 by TD, 1.7 by FICK - Repeat RHC (  9/25): RA 6, PA 86/28 mean 51, mean PCWP 13, CI 2.72 Fick, PVR 7.2 WU.  No evidence for step-up in oxygen  saturation from SVC to PA.  - HIV negative, anti-centromere negative, anti-SCL 70 negative, ANA negative, RF negative - HRCT chest (9/25): Possible small airways disease, no evidence for ILD. No emphysema or bronchiectasis.  5.  Active smoker: PFTs in 12/24 and HRCT chest in 9/25 not suggestive of significant COPD.  6. Carotid stenosis: Carotid dopplers (10/25) with 50-69% RICA.  7. CAD: Anterior STEMI 2016 with DES to LAD.  - LHC (9/25): Nonobstructive CAD.  8. OSA: CPAP.  9. DM2 10. Hyperlipidemia  ROS: All systems negative except as listed in HPI, PMH and Problem List.  SH:  Social History   Socioeconomic History   Marital status: Widowed    Spouse name: Not on file   Number of children: Not on file   Years of education: Not on file   Highest education level: Associate degree: occupational, scientist, product/process development, or vocational program  Occupational History   Occupation: Office work for [employer]  Tobacco Use   Smoking status: Some Days    Current packs/day: 1.00    Average packs/day: 1 pack/day for 51.1 years (51.1 ttl pk-yrs)    Types: Cigarettes    Start date: 12/26/1972   Smokeless tobacco: Never   Tobacco comments:    3 cigs per day currently 11/07/23  Vaping Use   Vaping status: Never Used  Substance and Sexual Activity   Alcohol  use: No    Alcohol /week: 0.0 standard drinks of alcohol    Drug use: No   Sexual activity: Yes    Partners: Male    Birth control/protection: Surgical  Other Topics Concern   Not on file  Social History Narrative   Not on file   Social Drivers of Health   Tobacco Use: High Risk (01/23/2024)   Patient History    Smoking Tobacco Use: Some Days    Smokeless Tobacco Use: Never    Passive Exposure: Not on file  Financial Resource Strain: Low Risk (01/16/2024)   Overall Financial Resource Strain (CARDIA)    Difficulty of Paying Living Expenses: Not very hard  Food Insecurity: No Food Insecurity (01/16/2024)   Epic    Worried About Radiation Protection Practitioner of Food in the Last Year: Never true    Ran Out of Food in the Last Year: Never true  Transportation Needs: No Transportation Needs (01/16/2024)   Epic    Lack of Transportation (Medical): No    Lack of  Transportation (Non-Medical): No  Physical Activity: Insufficiently Active (01/16/2024)   Exercise Vital Sign    Days of Exercise per Week: 2 days    Minutes of Exercise per Session: 30 min  Stress: No Stress Concern Present (01/16/2024)   [employer] of Occupational Health - Occupational Stress Questionnaire    Feeling of Stress: Not at all  Social Connections: Moderately Isolated (01/16/2024)   Social Connection and Isolation Panel    Frequency of Communication with Friends and Family: More than three times a week    Frequency of Social Gatherings with Friends and Family: Once a week    Attends Religious Services: 1 to 4 times per year    Active Member of [employer] or Organizations: No    Attends Banker Meetings: Not on file    Marital Status: Widowed  Intimate Partner Violence: Not At Risk (10/25/2023)   Epic    Fear of Current or Ex-Partner: No  Emotionally Abused: No    Physically Abused: No    Sexually Abused: No  Depression (PHQ2-9): Low Risk (01/20/2024)   Depression (PHQ2-9)    PHQ-2 Score: 0  Alcohol  Screen: Not on file  Housing: Low Risk (01/16/2024)   Epic    Unable to Pay for Housing in the Last Year: No    Number of Times Moved in the Last Year: 0    Homeless in the Last Year: No  Utilities: Not At Risk (10/25/2023)   Epic    Threatened with loss of utilities: No  Health Literacy: Not on file    FH:  Family History  Problem Relation Age of Onset   Heart attack Father    Heart disease Father    Cancer Mother    Heart disease Mother    Cancer Brother    Cancer Maternal Grandfather    Cancer Maternal Grandmother    Cancer Maternal Aunt    Cancer Maternal Aunt    Cancer Maternal Aunt    Cancer Maternal Aunt    Current Outpatient Medications  Medication Sig Dispense Refill   albuterol  (VENTOLIN  HFA) 108 (90 Base) MCG/ACT inhaler Inhale 2 puffs into the lungs every 6 (six) hours as needed for wheezing or shortness of breath.      amLODipine  (NORVASC ) 2.5 MG tablet Take 1 tablet (2.5 mg total) by mouth daily. 90 tablet 3   apixaban  (ELIQUIS ) 5 MG TABS tablet Take 1 tablet (5 mg total) by mouth 2 (two) times daily. 180 tablet 3   budesonide -formoterol  (BREYNA ) 80-4.5 MCG/ACT inhaler Inhale 2 puffs into the lungs 2 (two) times daily.     Continuous Glucose Sensor (FREESTYLE LIBRE 3 PLUS SENSOR) MISC Change sensor every 15 days. 6 each 3   dapagliflozin  propanediol (FARXIGA ) 10 MG TABS tablet Take 1 tablet (10 mg total) by mouth daily. 90 tablet 3   escitalopram  (LEXAPRO ) 20 MG tablet TAKE 1 TABLET (20 MG TOTAL) BY MOUTH DAILY AT 6 (SIX) AM. 90 tablet 1   macitentan  (OPSUMIT ) 10 MG tablet Take 1 tablet (10 mg total) by mouth daily. 90 tablet 3   metFORMIN  (GLUCOPHAGE ) 1000 MG tablet Take 1 tablet (1,000 mg total) by mouth 2 (two) times daily with a meal. 180 tablet 3   nystatin  cream (MYCOSTATIN ) Apply 1 Application topically as needed for dry skin.     Riociguat  (ADEMPAS ) 2 MG TABS Take 2 mg by mouth in the morning, at noon, and at bedtime.     tirzepatide  (MOUNJARO ) 5 MG/0.5ML Pen Inject 5 mg into the skin once a week. 6 mL 1   torsemide  (DEMADEX ) 20 MG tablet Take 1 tablet (20 mg total) by mouth daily. 90 tablet 3   atorvastatin  (LIPITOR ) 40 MG tablet Take 1 tablet (40 mg total) by mouth daily. Take at dinner 90 tablet 1   No current facility-administered medications for this encounter.    Vitals:   01/23/24 1341  BP: (!) 108/50  Pulse: 85  SpO2: 91%  Weight: 86.4 kg (190 lb 6.4 oz)  Height: 5' 4 (1.626 m)    PHYSICAL EXAM: General: NAD Neck: No JVD, no thyromegaly or thyroid  nodule.  Lungs: Clear to auscultation bilaterally with normal respiratory effort. CV: Nondisplaced PMI.  Heart regular S1/S2, no S3/S4, 2/6 SEM RUSB.  No peripheral edema.  No carotid bruit.  Normal pedal pulses.  Abdomen: Soft, nontender, no hepatosplenomegaly, no distention.  Skin: Intact without lesions or rashes.  Neurologic: Alert  and oriented x 3.  Psych: Normal affect. Extremities: No clubbing or cyanosis.  HEENT: Normal.   ASSESSMENT & PLAN: 1. Pulmonary hypertension: Patient has severe pulmonary arterial hypertension with evidence with RV dysfunction by echo.  Echo in 9/25 showed EF 70-75% with D-shaped interventricular septum, mild RV enlargement with mild-moderate RV dysfunction, PASP 77 mmHg, dilated IVC, mild aortic stenosis.   RHC in 9/25 showed elevated right and left heart filling pressures with mean PAP 64 mmHg, PVR 10 WU.  CTA chest in 9/25 with no significant lung parenchymal disease and no acute PE.  Prior pulmonary workup revealed OSA and mild asthma.  Patient is a prior smoker.  She is on CPAP at night.  She is not hypoxemic at rest. Do not think that OSA can explain the extent of her PH.  CI was low on initial RHC, 1.7 by Fick and 1.9 by thermodilution.  This is an ominous finding.  Her episodes of presyncope are also concerning.  V/Q scan in 9/25 was concerning for chronic PEs, reviewed with pulmonary.  HRCT in 9/25 with no evidence for ILD. Bubble study suggestive of small PFO. HIV, ANA, SCL-70, anti-centromere Ab negative.  Repeat RHC in 9/25 after diuresis showed normally filling pressures, severe PAH, and CI improved 2.72.  No step-up in oxygen  saturation from SVC to PA (no significant shunt lesion).  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  She is a heterozygote for Factor V Leiden.  On exam today, she is not volume overloaded.  NYHA class II though 6 minute walk mildly less today.   - Continue riociguat , currently on 1.5 tid and planning to titrate up this week.  Goal will be 2.5 mg tid.  - Continue macitentan  10 mg daily.   - Given findings on V/Q scan, have referred to [facility] CTEPH program for evaluation.  She was seen at [facility], they are going to review the V/Q scan and discuss her in multidisciplinary conference.  - If she is not thought to primarily have CTEPH and to need specific treatment for  this condition, I will plan to initiate Uptravi +/- sotatercept and she will need eventual repeat RHC.  2. Chronic diastolic CHF: Elevated PCWP in setting of normal EF on initial RHC in 9/25.  She diuresed well, repeat RHC 9/25 showed normal filling pressures. She looks euvolemic on exam.  - Continue Torsemide  20 mg daily.  BMET/BNP today.  - Continue Farxiga  10 mg daily.   3. CAD: H/o anterior MI.  LHC in 9/25 with patent LAD stent, nonobstructive disease.  - Stopped ASA with apixaban  use.  - Continue statin, lipids ok in 11/25.  4. OSA: Continue CPAP.  5. Active smoker: Prior PFTs not consistent with COPD and emphysema not noted on CTA chest.   - Discussed smoking cessation. She has almost quit.  6. Type 2 diabetes: per primary. 7. Carotid stenosis: 50-69% R ICA stenosis and ~ 50% left ICA stenosis on carotid duplex 10/25.  - Repeat carotids in 1 year.  - Continue statin 8. 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  She has no h/o clots.  Venous dopplers negative for DVT and CTA chest did not show acute PE. She is a heterozygote for Factor V Leiden.  - Continue Eliquis .  9. HTN: BP controlled.   Follow up 2 months.   I spent 32 minutes reviewing records, interviewing/examining patient, and managing orders.    Ezra Shuck 01/23/2024

## 2024-01-24 ENCOUNTER — Ambulatory Visit: Admitting: Physician Assistant

## 2024-01-27 ENCOUNTER — Encounter (HOSPITAL_COMMUNITY)
Admission: RE | Admit: 2024-01-27 | Discharge: 2024-01-27 | Disposition: A | Discharge status: Home / Self Care | Source: Ambulatory Visit | Attending: Internal Medicine | Admitting: Internal Medicine

## 2024-01-27 ENCOUNTER — Other Ambulatory Visit: Payer: Self-pay | Admitting: Internal Medicine

## 2024-01-27 DIAGNOSIS — I5032 Chronic diastolic (congestive) heart failure: Secondary | ICD-10-CM | POA: Insufficient documentation

## 2024-01-27 DIAGNOSIS — F419 Anxiety disorder, unspecified: Secondary | ICD-10-CM

## 2024-01-27 DIAGNOSIS — I272 Pulmonary hypertension, unspecified: Secondary | ICD-10-CM | POA: Insufficient documentation

## 2024-01-30 ENCOUNTER — Encounter (HOSPITAL_COMMUNITY): Admission: RE | Admit: 2024-01-30 | Discharge: 2024-01-30 | Attending: Cardiology

## 2024-01-30 VITALS — Ht 64.0 in | Wt 183.0 lb

## 2024-01-30 DIAGNOSIS — I272 Pulmonary hypertension, unspecified: Secondary | ICD-10-CM

## 2024-01-30 DIAGNOSIS — I5032 Chronic diastolic (congestive) heart failure: Secondary | ICD-10-CM

## 2024-01-30 NOTE — Progress Notes (Signed)
 Pulmonary Individual Treatment Plan  Patient Details  Name: [name] MRN: [id number] Date of Birth: 69/10/1955 Referring Provider:   Flowsheet Row PULMONARY REHAB OTHER RESP ORIENTATION from 01/30/2024 in [facility] CARDIAC REHABILITATION  Referring Provider Rolan Barrack MD    Initial Encounter Date:  Flowsheet Row PULMONARY REHAB OTHER RESP ORIENTATION from 01/30/2024 in [facility]  Date 01/30/24    Visit Diagnosis: Heart failure, diastolic, chronic (HCC)  Pulmonary HTN (HCC)  Patient's Home Medications on Admission:  Current Medications[1]  Past Medical History: Past Medical History:  Diagnosis Date   Allergy    Codeine, Sulfur   Anxiety    Aortic stenosis    Asthma 01/26/2023   CAD (coronary artery disease)    a. 10/2014: Ant STEMI s/p DES to dLAD   Carotid disease, bilateral    a. Duplex 12/2014: mild-mod atherosclerotic plaque without hemodynamically significant stenosis.   Cataract 04/08/2016   CHF (congestive heart failure) (HCC) 08/27/2018   COPD (chronic obstructive pulmonary disease) (HCC)    Essential hypertension    Heart murmur 11/17/2014   HLD (hyperlipidemia)    Morbid obesity (HCC)    Myocardial infarction (HCC) 11/17/2014   Oxygen  deficiency 1996   1 LITER AT BEDTIME WITH BIPAP   PONV (postoperative nausea and vomiting)    Shingles    Sleep apnea    Uses CPAP   Type 2 diabetes mellitus (HCC)     Tobacco Use: Tobacco Use History[2]  Labs: Review Flowsheet  More data exists      Latest Ref Rng & Units 10/20/2023 10/24/2023 11/29/2023 12/20/2023 01/20/2024  Labs for ITP Cardiac and Pulmonary Rehab  Cholestrol 100 - 199 mg/dL - - 896  861  -  LDL (calc) 0 - 99 mg/dL - - 33  60  -  HDL-C >60 mg/dL - - 44  53  -  Trlycerides 0 - 149 mg/dL - - 868  850  -  Hemoglobin A1c - - - - 8.0  6.9      PH, Arterial 7.35 - 7.45 7.412  7.435  - - - -  PCO2 arterial 32 - 48 mmHg 50.0  47.4  - - - -  Bicarbonate 20.0 - 28.0 mmol/L 31.9   32.8  18.9  31.8  26.4  26.4  27.3  - - -  TCO2 22 - 32 mmol/L 33  34  20  33  28  28  29   - - -  Acid-base deficit 0.0 - 2.0 mmol/L 9.0  - - - -  O2 Saturation % 81  55  56  86  76  70  70  - - -    Details       This result is from an external source.   Multiple values from one day are sorted in reverse-chronological order         Capillary Blood Glucose: Lab Results  Component Value Date   GLUCAP 174 (H) 10/24/2023   GLUCAP 172 (H) 10/24/2023   GLUCAP 314 (H) 10/23/2023   GLUCAP 228 (H) 10/23/2023   GLUCAP 184 (H) 10/23/2023     Pulmonary Assessment Scores:  Pulmonary Assessment Scores     Row Name 01/27/24 1411 01/30/24 1117       ADL UCSD   ADL Phase Entry --    SOB Score total 24 --    Rest 0 --    Walk 3 --    Stairs 4 --    [employer]  0 --    Dress 0 --    Shop 0 --      CAT Score   CAT Score 9 --      mMRC Score   mMRC Score 2 3      UCSD: Self-administered rating of dyspnea associated with activities of daily living (ADLs) 6-point scale (0 = not at all to 5 = maximal or unable to do because of breathlessness)  Scoring Scores range from 0 to 120.  Minimally important difference is 5 units  CAT: CAT can identify the health impairment of COPD patients and is better correlated with disease progression.  CAT has a scoring range of zero to 40. The CAT score is classified into four groups of low (less than 10), medium (10 - 20), high (21-30) and very high (31-40) based on the impact level of disease on health status. A CAT score over 10 suggests significant symptoms.  A worsening CAT score could be explained by an exacerbation, poor medication adherence, poor inhaler technique, or progression of COPD or comorbid conditions.  CAT MCID is 2 points  mMRC: mMRC (Modified Medical Research Council) Dyspnea Scale is used to assess the degree of baseline functional disability in patients of respiratory disease due to dyspnea. No minimal important difference is  established. A decrease in score of 1 point or greater is considered a positive change.   Pulmonary Function Assessment:   Exercise Target Goals: Exercise Program Goal: Individual exercise prescription set using results from initial 6 min walk test and THRR while considering  patients activity barriers and safety.   Exercise Prescription Goal: Initial exercise prescription builds to 30-45 minutes a day of aerobic activity, 2-3 days per week.  Home exercise guidelines will be given to patient during program as part of exercise prescription that the participant will acknowledge.  Activity Barriers & Risk Stratification:  Activity Barriers & Cardiac Risk Stratification - 01/30/24 1033       Activity Barriers & Cardiac Risk Stratification   Activity Barriers Deconditioning;Muscular Weakness;Shortness of Breath          6 Minute Walk:  6 Minute Walk     Row Name 01/30/24 1113         6 Minute Walk   Phase Initial     Distance 1050 feet     Walk Time 6 minutes     # of Rest Breaks 1     MPH 1.98     METS 2.68     RPE 12     Perceived Dyspnea  2     VO2 Peak 9.38     Symptoms Yes (comment)     Comments SOB 45 sec rest     Resting HR 87 bpm     Resting BP 102/56     Resting Oxygen  Saturation  95 %     Exercise Oxygen  Saturation  during 6 min walk 87 %     Max Ex. HR 129 bpm     Max Ex. BP 140/56     2 Minute Post BP 118/56       Interval HR   1 Minute HR 114     2 Minute HR 122     3 Minute HR 128     4 Minute HR 117     5 Minute HR 120     6 Minute HR 129     2 Minute Post HR 99     Interval Heart Rate? Yes  Interval Oxygen    Interval Oxygen ? Yes     Baseline Oxygen  Saturation % 95 %     1 Minute Oxygen  Saturation % 88 %     1 Minute Liters of Oxygen  0 L     2 Minute Oxygen  Saturation % 88 %     2 Minute Liters of Oxygen  0 L     3 Minute Oxygen  Saturation % 87 %     3 Minute Liters of Oxygen  0 L     4 Minute Oxygen  Saturation % 92 %     4 Minute  Liters of Oxygen  0 L     5 Minute Oxygen  Saturation % 90 %     5 Minute Liters of Oxygen  0 L     6 Minute Oxygen  Saturation % 89 %     6 Minute Liters of Oxygen  0 L     2 Minute Post Oxygen  Saturation % 95 %     2 Minute Post Liters of Oxygen  0 L        Oxygen  Initial Assessment:  Oxygen  Initial Assessment - 01/30/24 1049       Home Oxygen    Home Oxygen  Device None    Sleep Oxygen  Prescription CPAP    Liters per minute 2    Home Exercise Oxygen  Prescription None    Home Resting Oxygen  Prescription None    Compliance with Home Oxygen  Use Yes      Intervention   Short Term Goals To learn and understand importance of monitoring SPO2 with pulse oximeter and demonstrate accurate use of the pulse oximeter.;To learn and understand importance of maintaining oxygen  saturations>88%;To learn and demonstrate proper pursed lip breathing techniques or other breathing techniques. ;To learn and demonstrate proper use of respiratory medications    Long  Term Goals Verbalizes importance of monitoring SPO2 with pulse oximeter and return demonstration;Maintenance of O2 saturations>88%;Exhibits proper breathing techniques, such as pursed lip breathing or other method taught during program session;Compliance with respiratory medication;Demonstrates proper use of MDIs          Oxygen  Re-Evaluation:   Oxygen  Discharge (Final Oxygen  Re-Evaluation):   Initial Exercise Prescription:  Initial Exercise Prescription - 01/30/24 1100       Date of Initial Exercise RX and Referring Provider   Date 01/30/24    Referring Provider Rolan Barrack MD      Treadmill   MPH 1    Grade 0    Minutes 15    METs 1.77      NuStep   Level 1    SPM 50    Minutes 1    METs 1.8      Prescription Details   Frequency (times per week) 3    Duration Progress to 30 minutes of continuous aerobic without signs/symptoms of physical distress      Intensity   THRR 40-80% of Max Heartrate 113-139    Ratings of  Perceived Exertion 11-13    Perceived Dyspnea 0-4      Resistance Training   Training Prescription Yes    Weight 2    Reps 10-15          Perform Capillary Blood Glucose checks as needed.  Exercise Prescription Changes:   Exercise Prescription Changes     Row Name 01/30/24 1100             Response to Exercise   Blood Pressure (Admit) 102/56       Blood Pressure (Exercise) 140/56  Blood Pressure (Exit) 118/56       Heart Rate (Admit) 87 bpm       Heart Rate (Exercise) 129 bpm       Heart Rate (Exit) 99 bpm       Oxygen  Saturation (Admit) 95 %       Oxygen  Saturation (Exercise) 87 %       Oxygen  Saturation (Exit) 95 %       Rating of Perceived Exertion (Exercise) 12       Perceived Dyspnea (Exercise) 2          Exercise Comments:   Exercise Comments     Row Name 01/30/24 1048           Exercise Comments Not much, does household chores at home.          Exercise Goals and Review:   Exercise Goals     Row Name 01/30/24 1047             Exercise Goals   Increase Physical Activity Yes       Intervention Provide advice, education, support and counseling about physical activity/exercise needs.;Develop an individualized exercise prescription for aerobic and resistive training based on initial evaluation findings, risk stratification, comorbidities and participant's personal goals.       Expected Outcomes Short Term: Attend rehab on a regular basis to increase amount of physical activity.;Long Term: Add in home exercise to make exercise part of routine and to increase amount of physical activity.;Long Term: Exercising regularly at least 3-5 days a week.       Increase Strength and Stamina Yes       Intervention Provide advice, education, support and counseling about physical activity/exercise needs.;Develop an individualized exercise prescription for aerobic and resistive training based on initial evaluation findings, risk stratification, comorbidities  and participant's personal goals.       Expected Outcomes Short Term: Increase workloads from initial exercise prescription for resistance, speed, and METs.;Short Term: Perform resistance training exercises routinely during rehab and add in resistance training at home;Long Term: Improve cardiorespiratory fitness, muscular endurance and strength as measured by increased METs and functional capacity ( )       Able to understand and use rate of perceived exertion (RPE) scale Yes       Intervention Provide education and explanation on how to use RPE scale       Expected Outcomes Short Term: Able to use RPE daily in rehab to express subjective intensity level;Long Term:  Able to use RPE to guide intensity level when exercising independently       Able to understand and use Dyspnea scale Yes       Intervention Provide education and explanation on how to use Dyspnea scale       Expected Outcomes Short Term: Able to use Dyspnea scale daily in rehab to express subjective sense of shortness of breath during exertion;Long Term: Able to use Dyspnea scale to guide intensity level when exercising independently       Knowledge and understanding of Target Heart Rate Range (THRR) Yes       Intervention Provide education and explanation of THRR including how the numbers were predicted and where they are located for reference       Expected Outcomes Short Term: Able to state/look up THRR;Short Term: Able to use daily as guideline for intensity in rehab;Long Term: Able to use THRR to govern intensity when exercising independently       Able to check pulse  independently Yes       Intervention Provide education and demonstration on how to check pulse in carotid and radial arteries.;Review the importance of being able to check your own pulse for safety during independent exercise       Expected Outcomes Long Term: Able to check pulse independently and accurately;Short Term: Able to explain why pulse checking is important  during independent exercise       Understanding of Exercise Prescription Yes       Intervention Provide education, explanation, and written materials on patient's individual exercise prescription       Expected Outcomes Short Term: Able to explain program exercise prescription;Long Term: Able to explain home exercise prescription to exercise independently          Exercise Goals Re-Evaluation :   Discharge Exercise Prescription (Final Exercise Prescription Changes):  Exercise Prescription Changes - 01/30/24 1100       Response to Exercise   Blood Pressure (Admit) 102/56    Blood Pressure (Exercise) 140/56    Blood Pressure (Exit) 118/56    Heart Rate (Admit) 87 bpm    Heart Rate (Exercise) 129 bpm    Heart Rate (Exit) 99 bpm    Oxygen  Saturation (Admit) 95 %    Oxygen  Saturation (Exercise) 87 %    Oxygen  Saturation (Exit) 95 %    Rating of Perceived Exertion (Exercise) 12    Perceived Dyspnea (Exercise) 2          Nutrition:  Target Goals: Understanding of nutrition guidelines, daily intake of sodium 1500mg , cholesterol 200mg , calories 30% from fat and 7% or less from saturated fats, daily to have 5 or more servings of fruits and vegetables.  Biometrics:  Pre Biometrics - 01/30/24 1116       Pre Biometrics   Height 5' 4 (1.626 m)    Weight 83 kg    Waist Circumference 42 inches    Hip Circumference 48.5 inches    Waist to Hip Ratio 0.87 %    BMI (Calculated) 31.39    Grip Strength 15.1 kg           Nutrition Therapy Plan and Nutrition Goals:  Nutrition Therapy & Goals - 01/30/24 1055       Intervention Plan   Intervention Nutrition handout(s) given to patient.;Prescribe, educate and counsel regarding individualized specific dietary modifications aiming towards targeted core components such as weight, hypertension, lipid management, diabetes, heart failure and other comorbidities.    Expected Outcomes Long Term Goal: Adherence to prescribed nutrition  plan.;Short Term Goal: A plan has been developed with personal nutrition goals set during dietitian appointment.;Short Term Goal: Understand basic principles of dietary content, such as calories, fat, sodium, cholesterol and nutrients.          Nutrition Assessments:  MEDIFICTS Score Key: >=70 Need to make dietary changes  40-70 Heart Healthy Diet <= 40 Therapeutic Level Cholesterol Diet  Flowsheet Row PULMONARY VIRTUAL BASED CARE from 01/27/2024 in [facility] CARDIAC REH[facility]ITATION  Picture Your Plate Total Score on Admission 74   Picture Your Plate Scores: <59 Unhealthy dietary pattern with much room for improvement. 41-50 Dietary pattern unlikely to meet recommendations for good health and room for improvement. 51-60 More healthful dietary pattern, with some room for improvement.  >60 Healthy dietary pattern, although there may be some specific behaviors that could be improved.    Nutrition Goals Re-Evaluation:   Nutrition Goals Discharge (Final Nutrition Goals Re-Evaluation):   Psychosocial: Target Goals: Acknowledge presence or absence  of significant depression and/or stress, maximize coping skills, provide positive support system. Participant is able to verbalize types and ability to use techniques and skills needed for reducing stress and depression.  Initial Review & Psychosocial Screening:  Initial Psych Review & Screening - 01/30/24 1051       Initial Review   Current issues with None Identified      Family Dynamics   Good Support System? Yes      Barriers   Psychosocial barriers to participate in program There are no identifiable barriers or psychosocial needs.      Screening Interventions   Interventions Encouraged to exercise    Expected Outcomes Long Term goal: The participant improves quality of Life and PHQ9 Scores as seen by post scores and/or verbalization of changes;Short Term goal: Identification and review with participant of any Quality of Life or  Depression concerns found by scoring the questionnaire.;Long Term Goal: Stressors or current issues are controlled or eliminated.;Short Term goal: Utilizing psychosocial counselor, staff and physician to assist with identification of specific Stressors or current issues interfering with healing process. Setting desired goal for each stressor or current issue identified.          Quality of Life Scores:  Scores of 19 and below usually indicate a poorer quality of life in these areas.  A difference of  2-3 points is a clinically meaningful difference.  A difference of 2-3 points in the total score of the Quality of Life Index has been associated with significant improvement in overall quality of life, self-image, physical symptoms, and general health in studies assessing change in quality of life.  PHQ-9: Review Flowsheet  More data exists      01/27/2024 01/20/2024 11/07/2023 10/25/2023 08/10/2023  Depression screen PHQ 2/9  Decreased Interest 1 0 0 0 0  Down, Depressed, Hopeless 0 0 0 0 0  PHQ - 2 Score 1 0 0 0 0  Altered sleeping 0 0 0 - 0  Tired, decreased energy 1 0 0 - 0  Change in appetite 0 0 0 - 0  Feeling bad or failure about yourself  0 0 0 - 0  Trouble concentrating 0 0 0 - 0  Moving slowly or fidgety/restless 0 0 0 - 0  Suicidal thoughts 0 0 0 - 0  PHQ-9 Score 2 0 0  - 0   Difficult doing work/chores Not difficult at all Not difficult at all Not difficult at all - Not difficult at all    Details       Data saved with a previous flowsheet row definition        Interpretation of Total Score  Total Score Depression Severity:  1-4 = Minimal depression, 5-9 = Mild depression, 10-14 = Moderate depression, 15-19 = Moderately severe depression, 20-27 = Severe depression   Psychosocial Evaluation and Intervention:  Psychosocial Evaluation - 01/30/24 1052       Psychosocial Evaluation & Interventions   Interventions Encouraged to exercise with the program and follow  exercise prescription    Comments Stacye is a pleasant 69 year old female who is coming into pulmonary rehab for dual diagnosis'. She has completed cardiac therapy before back in 2016/2017. She currently denies any stressors or sleep concerns. She doesn't do much exercise at home besides household chores. She works full time for a [employer], and they allow her to leave for these appointments and come back if needed. She currently smokes 1ppd, cessation information given. She is wanting to  increase her leg and arm strength.    Expected Outcomes Short: Increase arm and leg strength. Long: Loose weight.    Continue Psychosocial Services  Follow up required by staff          Psychosocial Re-Evaluation:   Psychosocial Discharge (Final Psychosocial Re-Evaluation):   Education: Education Goals: Education classes will be provided on a weekly basis, covering required topics. Participant will state understanding/return demonstration of topics presented.  Learning Barriers/Preferences:  Learning Barriers/Preferences - 01/30/24 1055       Learning Barriers/Preferences   Learning Barriers None    Learning Preferences None          Education Topics: Know Your Numbers Group instruction that is supported by a PowerPoint presentation. Instructor discusses importance of knowing and understanding resting, exercise, and post-exercise oxygen  saturation, heart rate, and blood pressure. Oxygen  saturation, heart rate, blood pressure, rating of perceived exertion, and dyspnea are reviewed along with a normal range for these values.    Exercise for the Pulmonary Patient Group instruction that is supported by a PowerPoint presentation. Instructor discusses benefits of exercise, core components of exercise, frequency, duration, and intensity of an exercise routine, importance of utilizing pulse oximetry during exercise, safety while exercising, and options of places to exercise outside of  rehab.    MET Level  Group instruction provided by PowerPoint, verbal discussion, and written material to support subject matter. Instructor reviews what METs are and how to increase METs.    Pulmonary Medications Verbally interactive group education provided by instructor with focus on inhaled medications and proper administration.   Anatomy and Physiology of the Respiratory System Group instruction provided by PowerPoint, verbal discussion, and written material to support subject matter. Instructor reviews respiratory cycle and anatomical components of the respiratory system and their functions. Instructor also reviews differences in obstructive and restrictive respiratory diseases with examples of each.    Oxygen  Safety Group instruction provided by PowerPoint, verbal discussion, and written material to support subject matter. There is an overview of What is Oxygen  and Why do we need it.  Instructor also reviews how to create a safe environment for oxygen  use, the importance of using oxygen  as prescribed, and the risks of noncompliance. There is a brief discussion on traveling with oxygen  and resources the patient may utilize.   Oxygen  Use Group instruction provided by PowerPoint, verbal discussion, and written material to discuss how supplemental oxygen  is prescribed and different types of oxygen  supply systems. Resources for more information are provided.    Breathing Techniques Group instruction that is supported by demonstration and informational handouts. Instructor discusses the benefits of pursed lip and diaphragmatic breathing and detailed demonstration on how to perform both.     Risk Factor Reduction Group instruction that is supported by a PowerPoint presentation. Instructor discusses the definition of a risk factor, different risk factors for pulmonary disease, and how the heart and lungs work together.   Pulmonary Diseases Group instruction provided by PowerPoint,  verbal discussion, and written material to support subject matter. Instructor gives an overview of the different type of pulmonary diseases. There is also a discussion on risk factors and symptoms as well as ways to manage the diseases.   Stress and Energy Conservation Group instruction provided by PowerPoint, verbal discussion, and written material to support subject matter. Instructor gives an overview of stress and the impact it can have on the body. Instructor also reviews ways to reduce stress. There is also a discussion on energy conservation and ways to  conserve energy throughout the day.   Warning Signs and Symptoms Group instruction provided by PowerPoint, verbal discussion, and written material to support subject matter. Instructor reviews warning signs and symptoms of stroke, heart attack, cold and flu. Instructor also reviews ways to prevent the spread of infection.   Other Education Group or individual verbal, written, or video instructions that support the educational goals of the pulmonary rehab program.    Knowledge Questionnaire Score:  Knowledge Questionnaire Score - 01/27/24 1411       Knowledge Questionnaire Score   Pre Score 14/18          Core Components/Risk Factors/Patient Goals at Admission:  Personal Goals and Risk Factors at Admission - 01/30/24 1048       Core Components/Risk Factors/Patient Goals on Admission    Weight Management Yes    Intervention Weight Management: Develop a combined nutrition and exercise program designed to reach desired caloric intake, while maintaining appropriate intake of nutrient and fiber, sodium and fats, and appropriate energy expenditure required for the weight goal.;Weight Management/Obesity: Establish reasonable short term and long term weight goals.;Weight Management: Provide education and appropriate resources to help participant work on and attain dietary goals.    Expected Outcomes Short Term: Continue to assess and  modify interventions until short term weight is achieved;Long Term: Adherence to nutrition and physical activity/exercise program aimed toward attainment of established weight goal;Weight Maintenance: Understanding of the daily nutrition guidelines, which includes 25-35% calories from fat, 7% or less cal from saturated fats, less than 200mg  cholesterol, less than 1.5gm of sodium, & 5 or more servings of fruits and vegetables daily;Understanding recommendations for meals to include 15-35% energy as protein, 25-35% energy from fat, 35-60% energy from carbohydrates, less than 200mg  of dietary cholesterol, 20-35 gm of total fiber daily;Understanding of distribution of calorie intake throughout the day with the consumption of 4-5 meals/snacks    Tobacco Cessation Yes    Number of packs per day 1 ppd    Intervention Assist the participant in steps to quit. Provide individualized education and counseling about committing to Tobacco Cessation, relapse prevention, and pharmacological support that can be provided by physician.;Education officer, environmental, assist with locating and accessing local/national Quit Smoking programs, and support quit date choice.    Expected Outcomes Short Term: Will demonstrate readiness to quit, by selecting a quit date.;Short Term: Will quit all tobacco product use, adhering to prevention of relapse plan.;Long Term: Complete abstinence from all tobacco products for at least 12 months from quit date.    Improve shortness of breath with ADL's Yes    Intervention Provide education, individualized exercise plan and daily activity instruction to help decrease symptoms of SOB with activities of daily living.    Expected Outcomes Short Term: Improve cardiorespiratory fitness to achieve a reduction of symptoms when performing ADLs;Long Term: Be able to perform more ADLs without symptoms or delay the onset of symptoms    Increase knowledge of respiratory medications and ability to use  respiratory devices properly  Yes    Intervention Provide education and demonstration as needed of appropriate use of medications, inhalers, and oxygen  therapy.    Expected Outcomes Short Term: Achieves understanding of medications use. Understands that oxygen  is a medication prescribed by physician. Demonstrates appropriate use of inhaler and oxygen  therapy.;Long Term: Maintain appropriate use of medications, inhalers, and oxygen  therapy.    Diabetes Yes    Intervention Provide education about signs/symptoms and action to take for hypo/hyperglycemia.;Provide education about proper nutrition, including hydration,  and aerobic/resistive exercise prescription along with prescribed medications to achieve blood glucose in normal ranges: Fasting glucose 65-99 mg/dL    Expected Outcomes Long Term: Attainment of HbA1C < 7%.;Short Term: Participant verbalizes understanding of the signs/symptoms and immediate care of hyper/hypoglycemia, proper foot care and importance of medication, aerobic/resistive exercise and nutrition plan for blood glucose control.    Heart Failure Yes    Intervention Provide a combined exercise and nutrition program that is supplemented with education, support and counseling about heart failure. Directed toward relieving symptoms such as shortness of breath, decreased exercise tolerance, and extremity edema.    Expected Outcomes Improve functional capacity of life;Short term: Attendance in program 2-3 days a week with increased exercise capacity. Reported lower sodium intake. Reported increased fruit and vegetable intake. Reports medication compliance.;Short term: Daily weights obtained and reported for increase. Utilizing diuretic protocols set by physician.;Long term: Adoption of self-care skills and reduction of barriers for early signs and symptoms recognition and intervention leading to self-care maintenance.    Hypertension Yes    Intervention Monitor prescription use  compliance.;Provide education on lifestyle modifcations including regular physical activity/exercise, weight management, moderate sodium restriction and increased consumption of fresh fruit, vegetables, and low fat dairy, alcohol  moderation, and smoking cessation.    Expected Outcomes Long Term: Maintenance of blood pressure at goal levels.;Short Term: Continued assessment and intervention until BP is < 140/33mm HG in hypertensive participants. < 130/53mm HG in hypertensive participants with diabetes, heart failure or chronic kidney disease.    Lipids Yes    Intervention Provide education and support for participant on nutrition & aerobic/resistive exercise along with prescribed medications to achieve LDL 70mg , HDL >40mg .    Expected Outcomes Short Term: Participant states understanding of desired cholesterol values and is compliant with medications prescribed. Participant is following exercise prescription and nutrition guidelines.;Long Term: Cholesterol controlled with medications as prescribed, with individualized exercise RX and with personalized nutrition plan. Value goals: LDL < 70mg , HDL > 40 mg.          Core Components/Risk Factors/Patient Goals Review:    Core Components/Risk Factors/Patient Goals at Discharge (Final Review):    ITP Comments:  ITP Comments     Row Name 01/30/24 1047           ITP Comments Patient attend orientation today.  Patient is attending Pulmonary Rehabilitation Program.  Documentation for diagnosis can be found in [facility] 12/29.  Reviewed medical chart, RPE/RPD, gym safety, and program guidelines.  Patient was fitted to equipment they will be using during rehab.  Patient is scheduled to start exercise on 02/06/24.   Initial ITP created and sent for review and signature by Dr. Anton Kelp, Medical Director for Pulmonary Rehabilitation Program.          Comments: Initial ITP.     [1]  Current Outpatient Medications:    albuterol  (VENTOLIN  HFA) 108 (90  Base) MCG/ACT inhaler, Inhale 2 puffs into the lungs every 6 (six) hours as needed for wheezing or shortness of breath., Disp: , Rfl:    amLODipine  (NORVASC ) 2.5 MG tablet, Take 1 tablet (2.5 mg total) by mouth daily., Disp: 90 tablet, Rfl: 3   apixaban  (ELIQUIS ) 5 MG TABS tablet, Take 1 tablet (5 mg total) by mouth 2 (two) times daily., Disp: 180 tablet, Rfl: 3   atorvastatin  (LIPITOR ) 40 MG tablet, Take 1 tablet (40 mg total) by mouth daily. Take at dinner, Disp: 90 tablet, Rfl: 1   budesonide -formoterol  (BREYNA ) 80-4.5 MCG/ACT inhaler, Inhale 2  puffs into the lungs 2 (two) times daily., Disp: , Rfl:    Continuous Glucose Sensor (FREESTYLE LIBRE 3 PLUS SENSOR) MISC, Change sensor every 15 days., Disp: 6 each, Rfl: 3   dapagliflozin  propanediol (FARXIGA ) 10 MG TABS tablet, Take 1 tablet (10 mg total) by mouth daily., Disp: 90 tablet, Rfl: 3   escitalopram  (LEXAPRO ) 20 MG tablet, TAKE 1 TABLET (20 MG TOTAL) BY MOUTH DAILY AT 6 (SIX) AM., Disp: 90 tablet, Rfl: 1   macitentan  (OPSUMIT ) 10 MG tablet, Take 1 tablet (10 mg total) by mouth daily., Disp: 90 tablet, Rfl: 3   metFORMIN  (GLUCOPHAGE ) 1000 MG tablet, Take 1 tablet (1,000 mg total) by mouth 2 (two) times daily with a meal., Disp: 180 tablet, Rfl: 3   nystatin  cream (MYCOSTATIN ), Apply 1 Application topically as needed for dry skin., Disp: , Rfl:    Riociguat  (ADEMPAS ) 2 MG TABS, Take 2 mg by mouth in the morning, at noon, and at bedtime., Disp: , Rfl:    tirzepatide  (MOUNJARO ) 5 MG/0.5ML Pen, Inject 5 mg into the skin once a week., Disp: 6 mL, Rfl: 1   torsemide  (DEMADEX ) 20 MG tablet, Take 1 tablet (20 mg total) by mouth daily., Disp: 90 tablet, Rfl: 3 [2]  Social History Tobacco Use  Smoking Status Some Days   Current packs/day: 1.00   Average packs/day: 1 pack/day for 51.1 years (51.1 ttl pk-yrs)   Types: Cigarettes   Start date: 12/26/1972  Smokeless Tobacco Never  Tobacco Comments   3 cigs per day currently 11/07/23

## 2024-01-30 NOTE — Progress Notes (Signed)
 Patient attend orientation today.  Patient is attending Pulmonary Rehabilitation Program.  Documentation for diagnosis can be found in [facility] 12/29.  Reviewed medical chart, RPE/RPD, gym safety, and program guidelines.  Patient was fitted to equipment they will be using during rehab.  Patient is scheduled to start exercise on 02/06/24.   Initial ITP created and sent for review and signature by Dr. Anton Kelp, Medical Director for Pulmonary Rehabilitation Program.

## 2024-01-30 NOTE — Patient Instructions (Addendum)
 Patient Instructions  Patient Details  Name: [name] MRN: [id number] Date of Birth: Jan 02, 1956 Referring Provider:  Rolan Ezra RAMAN, MD  Below are your personal goals for exercise, nutrition, and risk factors. Our goal is to help you stay on track towards obtaining and maintaining these goals. We will be discussing your progress on these goals with you throughout the program.  Initial Exercise Prescription:  Initial Exercise Prescription - 01/30/24 1100       Date of Initial Exercise RX and Referring Provider   Date 01/30/24    Referring Provider Rolan Ezra MD      Treadmill   MPH 1    Grade 0    Minutes 15    METs 1.77      NuStep   Level 1    SPM 50    Minutes 1    METs 1.8      Prescription Details   Frequency (times per week) 3    Duration Progress to 30 minutes of continuous aerobic without signs/symptoms of physical distress      Intensity   THRR 40-80% of Max Heartrate 113-139    Ratings of Perceived Exertion 11-13    Perceived Dyspnea 0-4      Resistance Training   Training Prescription Yes    Weight 2    Reps 10-15          Exercise Goals: Frequency: Be able to perform aerobic exercise two to three times per week in program working toward 2-5 days per week of home exercise.  Intensity: Work with a perceived exertion of 11 (fairly light) - 15 (hard) while following your exercise prescription.  We will make changes to your prescription with you as you progress through the program.   Duration: Be able to do 30 to 45 minutes of continuous aerobic exercise in addition to a 5 minute warm-up and a 5 minute cool-down routine.   Nutrition Goals: Your personal nutrition goals will be established when you do your nutrition analysis with the dietician.  The following are general nutrition guidelines to follow: Cholesterol < 200mg /day Sodium < 1500mg /day Fiber: Women over 50 yrs - 21 grams per day  Personal Goals:  Personal Goals and Risk Factors  at Admission - 01/30/24 1048       Core Components/Risk Factors/Patient Goals on Admission    Weight Management Yes    Intervention Weight Management: Develop a combined nutrition and exercise program designed to reach desired caloric intake, while maintaining appropriate intake of nutrient and fiber, sodium and fats, and appropriate energy expenditure required for the weight goal.;Weight Management/Obesity: Establish reasonable short term and long term weight goals.;Weight Management: Provide education and appropriate resources to help participant work on and attain dietary goals.    Expected Outcomes Short Term: Continue to assess and modify interventions until short term weight is achieved;Long Term: Adherence to nutrition and physical activity/exercise program aimed toward attainment of established weight goal;Weight Maintenance: Understanding of the daily nutrition guidelines, which includes 25-35% calories from fat, 7% or less cal from saturated fats, less than 200mg  cholesterol, less than 1.5gm of sodium, & 5 or more servings of fruits and vegetables daily;Understanding recommendations for meals to include 15-35% energy as protein, 25-35% energy from fat, 35-60% energy from carbohydrates, less than 200mg  of dietary cholesterol, 20-35 gm of total fiber daily;Understanding of distribution of calorie intake throughout the day with the consumption of 4-5 meals/snacks    Tobacco Cessation Yes    Number of packs per  day 1 ppd    Intervention Assist the participant in steps to quit. Provide individualized education and counseling about committing to Tobacco Cessation, relapse prevention, and pharmacological support that can be provided by physician.;Education officer, environmental, assist with locating and accessing local/national Quit Smoking programs, and support quit date choice.    Expected Outcomes Short Term: Will demonstrate readiness to quit, by selecting a quit date.;Short Term: Will quit all  tobacco product use, adhering to prevention of relapse plan.;Long Term: Complete abstinence from all tobacco products for at least 12 months from quit date.    Improve shortness of breath with ADL's Yes    Intervention Provide education, individualized exercise plan and daily activity instruction to help decrease symptoms of SOB with activities of daily living.    Expected Outcomes Short Term: Improve cardiorespiratory fitness to achieve a reduction of symptoms when performing ADLs;Long Term: Be able to perform more ADLs without symptoms or delay the onset of symptoms    Increase knowledge of respiratory medications and ability to use respiratory devices properly  Yes    Intervention Provide education and demonstration as needed of appropriate use of medications, inhalers, and oxygen  therapy.    Expected Outcomes Short Term: Achieves understanding of medications use. Understands that oxygen  is a medication prescribed by physician. Demonstrates appropriate use of inhaler and oxygen  therapy.;Long Term: Maintain appropriate use of medications, inhalers, and oxygen  therapy.    Diabetes Yes    Intervention Provide education about signs/symptoms and action to take for hypo/hyperglycemia.;Provide education about proper nutrition, including hydration, and aerobic/resistive exercise prescription along with prescribed medications to achieve blood glucose in normal ranges: Fasting glucose 65-99 mg/dL    Expected Outcomes Long Term: Attainment of HbA1C < 7%.;Short Term: Participant verbalizes understanding of the signs/symptoms and immediate care of hyper/hypoglycemia, proper foot care and importance of medication, aerobic/resistive exercise and nutrition plan for blood glucose control.    Heart Failure Yes    Intervention Provide a combined exercise and nutrition program that is supplemented with education, support and counseling about heart failure. Directed toward relieving symptoms such as shortness of breath,  decreased exercise tolerance, and extremity edema.    Expected Outcomes Improve functional capacity of life;Short term: Attendance in program 2-3 days a week with increased exercise capacity. Reported lower sodium intake. Reported increased fruit and vegetable intake. Reports medication compliance.;Short term: Daily weights obtained and reported for increase. Utilizing diuretic protocols set by physician.;Long term: Adoption of self-care skills and reduction of barriers for early signs and symptoms recognition and intervention leading to self-care maintenance.    Hypertension Yes    Intervention Monitor prescription use compliance.;Provide education on lifestyle modifcations including regular physical activity/exercise, weight management, moderate sodium restriction and increased consumption of fresh fruit, vegetables, and low fat dairy, alcohol  moderation, and smoking cessation.    Expected Outcomes Long Term: Maintenance of blood pressure at goal levels.;Short Term: Continued assessment and intervention until BP is < 140/73mm HG in hypertensive participants. < 130/32mm HG in hypertensive participants with diabetes, heart failure or chronic kidney disease.    Lipids Yes    Intervention Provide education and support for participant on nutrition & aerobic/resistive exercise along with prescribed medications to achieve LDL 70mg , HDL >40mg .    Expected Outcomes Short Term: Participant states understanding of desired cholesterol values and is compliant with medications prescribed. Participant is following exercise prescription and nutrition guidelines.;Long Term: Cholesterol controlled with medications as prescribed, with individualized exercise RX and with personalized nutrition plan. Value goals: LDL <  70mg , HDL > 40 mg.          Tobacco Use Initial Evaluation: Social History   Tobacco Use  Smoking Status Some Days   Current packs/day: 1.00   Average packs/day: 1 pack/day for 51.1 years (51.1 ttl  pk-yrs)   Types: Cigarettes   Start date: 12/26/1972  Smokeless Tobacco Never  Tobacco Comments   3 cigs per day currently 11/07/23    Exercise Goals and Review:  Exercise Goals     Row Name 01/30/24 1047             Exercise Goals   Increase Physical Activity Yes       Intervention Provide advice, education, support and counseling about physical activity/exercise needs.;Develop an individualized exercise prescription for aerobic and resistive training based on initial evaluation findings, risk stratification, comorbidities and participant's personal goals.       Expected Outcomes Short Term: Attend rehab on a regular basis to increase amount of physical activity.;Long Term: Add in home exercise to make exercise part of routine and to increase amount of physical activity.;Long Term: Exercising regularly at least 3-5 days a week.       Increase Strength and Stamina Yes       Intervention Provide advice, education, support and counseling about physical activity/exercise needs.;Develop an individualized exercise prescription for aerobic and resistive training based on initial evaluation findings, risk stratification, comorbidities and participant's personal goals.       Expected Outcomes Short Term: Increase workloads from initial exercise prescription for resistance, speed, and METs.;Short Term: Perform resistance training exercises routinely during rehab and add in resistance training at home;Long Term: Improve cardiorespiratory fitness, muscular endurance and strength as measured by increased METs and functional capacity ( )       Able to understand and use rate of perceived exertion (RPE) scale Yes       Intervention Provide education and explanation on how to use RPE scale       Expected Outcomes Short Term: Able to use RPE daily in rehab to express subjective intensity level;Long Term:  Able to use RPE to guide intensity level when exercising independently       Able to understand and  use Dyspnea scale Yes       Intervention Provide education and explanation on how to use Dyspnea scale       Expected Outcomes Short Term: Able to use Dyspnea scale daily in rehab to express subjective sense of shortness of breath during exertion;Long Term: Able to use Dyspnea scale to guide intensity level when exercising independently       Knowledge and understanding of Target Heart Rate Range (THRR) Yes       Intervention Provide education and explanation of THRR including how the numbers were predicted and where they are located for reference       Expected Outcomes Short Term: Able to state/look up THRR;Short Term: Able to use daily as guideline for intensity in rehab;Long Term: Able to use THRR to govern intensity when exercising independently       Able to check pulse independently Yes       Intervention Provide education and demonstration on how to check pulse in carotid and radial arteries.;Review the importance of being able to check your own pulse for safety during independent exercise       Expected Outcomes Long Term: Able to check pulse independently and accurately;Short Term: Able to explain why pulse checking is important during independent exercise  Understanding of Exercise Prescription Yes       Intervention Provide education, explanation, and written materials on patient's individual exercise prescription       Expected Outcomes Short Term: Able to explain program exercise prescription;Long Term: Able to explain home exercise prescription to exercise independently          Copy of goals given to participant.

## 2024-02-06 ENCOUNTER — Encounter (HOSPITAL_COMMUNITY)

## 2024-02-07 ENCOUNTER — Ambulatory Visit: Admitting: Internal Medicine

## 2024-02-08 ENCOUNTER — Encounter (HOSPITAL_COMMUNITY)

## 2024-02-10 ENCOUNTER — Encounter (HOSPITAL_COMMUNITY)

## 2024-02-10 ENCOUNTER — Encounter (HOSPITAL_COMMUNITY): Payer: Self-pay | Admitting: Cardiology

## 2024-02-13 ENCOUNTER — Encounter (HOSPITAL_COMMUNITY)

## 2024-02-13 ENCOUNTER — Other Ambulatory Visit (HOSPITAL_COMMUNITY): Payer: Self-pay

## 2024-02-15 ENCOUNTER — Encounter (HOSPITAL_COMMUNITY)

## 2024-02-17 ENCOUNTER — Encounter (HOSPITAL_COMMUNITY)

## 2024-02-20 ENCOUNTER — Encounter (HOSPITAL_COMMUNITY)

## 2024-02-21 ENCOUNTER — Other Ambulatory Visit (HOSPITAL_COMMUNITY): Payer: Self-pay

## 2024-02-21 ENCOUNTER — Encounter (HOSPITAL_COMMUNITY): Payer: Self-pay | Admitting: *Deleted

## 2024-02-21 DIAGNOSIS — I272 Pulmonary hypertension, unspecified: Secondary | ICD-10-CM

## 2024-02-21 DIAGNOSIS — I5032 Chronic diastolic (congestive) heart failure: Secondary | ICD-10-CM

## 2024-02-21 NOTE — Telephone Encounter (Signed)
 Contacted [facility] to reprocess claim to copay sayings program. Confirmed $0 copay for [place] . Informed patient by phone

## 2024-02-21 NOTE — Progress Notes (Signed)
 Pulmonary Individual Treatment Plan  Patient Details  Name: [name] MRN: [id number] Date of Birth: [birth year] Referring Provider:   Flowsheet Row PULMONARY REHAB OTHER RESP ORIENTATION from 01/30/2024 in [facility] CARDIAC REHABILITATION  Referring Provider Rolan Barrack MD    Initial Encounter Date:  Flowsheet Row PULMONARY REHAB OTHER RESP ORIENTATION from 01/30/2024 in [facility]  Date 01/30/24    Visit Diagnosis: Heart failure, diastolic, chronic (HCC)  Pulmonary HTN (HCC)  Patient's Home Medications on Admission:  Current Medications[1]  Past Medical History: Past Medical History:  Diagnosis Date   Allergy    Codeine, Sulfur   Anxiety    Aortic stenosis    Asthma 01/26/2023   CAD (coronary artery disease)    a. 10/2014: Ant STEMI s/p DES to dLAD   Carotid disease, bilateral    a. Duplex 12/2014: mild-mod atherosclerotic plaque without hemodynamically significant stenosis.   Cataract 04/08/2016   CHF (congestive heart failure) (HCC) 08/27/2018   COPD (chronic obstructive pulmonary disease) (HCC)    Essential hypertension    Heart murmur 11/17/2014   HLD (hyperlipidemia)    Morbid obesity (HCC)    Myocardial infarction (HCC) 11/17/2014   Oxygen  deficiency 1996   1 LITER AT BEDTIME WITH BIPAP   PONV (postoperative nausea and vomiting)    Shingles    Sleep apnea    Uses CPAP   Type 2 diabetes mellitus (HCC)     Tobacco Use: Tobacco Use History[2]  Labs: Review Flowsheet  More data exists      Latest Ref Rng & Units 10/20/2023 10/24/2023 11/29/2023 12/20/2023 01/20/2024  Labs for ITP Cardiac and Pulmonary Rehab  Cholestrol 100 - 199 mg/dL - - 896  861  -  LDL (calc) 0 - 99 mg/dL - - 33  60  -  HDL-C >60 mg/dL - - 44  53  -  Trlycerides 0 - 149 mg/dL - - 868  850  -  Hemoglobin A1c - - - - 8.0  6.9      PH, Arterial 7.35 - 7.45 7.412  7.435  - - - -  PCO2 arterial 32 - 48 mmHg 50.0  47.4  - - - -  Bicarbonate 20.0 - 28.0 mmol/L 31.9   32.8  18.9  31.8  26.4  26.4  27.3  - - -  TCO2 22 - 32 mmol/L 33  34  20  33  28  28  29   - - -  Acid-base deficit 0.0 - 2.0 mmol/L 9.0  - - - -  O2 Saturation % 81  55  56  86  76  70  70  - - -    Details       This result is from an external source.   Multiple values from one day are sorted in reverse-chronological order         Capillary Blood Glucose: Lab Results  Component Value Date   GLUCAP 174 (H) 10/24/2023   GLUCAP 172 (H) 10/24/2023   GLUCAP 314 (H) 10/23/2023   GLUCAP 228 (H) 10/23/2023   GLUCAP 184 (H) 10/23/2023     Pulmonary Assessment Scores:  Pulmonary Assessment Scores     Row Name 01/27/24 1411 01/30/24 1117       ADL UCSD   ADL Phase Entry --    SOB Score total 24 --    Rest 0 --    Walk 3 --    Stairs 4 --    [employer]  0 --    Dress 0 --    Shop 0 --      CAT Score   CAT Score 9 --      mMRC Score   mMRC Score 2 3      UCSD: Self-administered rating of dyspnea associated with activities of daily living (ADLs) 6-point scale (0 = not at all to 5 = maximal or unable to do because of breathlessness)  Scoring Scores range from 0 to 120.  Minimally important difference is 5 units  CAT: CAT can identify the health impairment of COPD patients and is better correlated with disease progression.  CAT has a scoring range of zero to 40. The CAT score is classified into four groups of low (less than 10), medium (10 - 20), high (21-30) and very high (31-40) based on the impact level of disease on health status. A CAT score over 10 suggests significant symptoms.  A worsening CAT score could be explained by an exacerbation, poor medication adherence, poor inhaler technique, or progression of COPD or comorbid conditions.  CAT MCID is 2 points  mMRC: mMRC (Modified Medical Research Council) Dyspnea Scale is used to assess the degree of baseline functional disability in patients of respiratory disease due to dyspnea. No minimal important difference is  established. A decrease in score of 1 point or greater is considered a positive change.   Pulmonary Function Assessment:   Exercise Target Goals: Exercise Program Goal: Individual exercise prescription set using results from initial 6 min walk test and THRR while considering  patients activity barriers and safety.   Exercise Prescription Goal: Initial exercise prescription builds to 30-45 minutes a day of aerobic activity, 2-3 days per week.  Home exercise guidelines will be given to patient during program as part of exercise prescription that the participant will acknowledge.  Activity Barriers & Risk Stratification:  Activity Barriers & Cardiac Risk Stratification - 01/30/24 1033       Activity Barriers & Cardiac Risk Stratification   Activity Barriers Deconditioning;Muscular Weakness;Shortness of Breath          6 Minute Walk:  6 Minute Walk     Row Name 01/30/24 1113         6 Minute Walk   Phase Initial     Distance 1050 feet     Walk Time 6 minutes     # of Rest Breaks 1     MPH 1.98     METS 2.68     RPE 12     Perceived Dyspnea  2     VO2 Peak 9.38     Symptoms Yes (comment)     Comments SOB 45 sec rest     Resting HR 87 bpm     Resting BP 102/56     Resting Oxygen  Saturation  95 %     Exercise Oxygen  Saturation  during 6 min walk 87 %     Max Ex. HR 129 bpm     Max Ex. BP 140/56     2 Minute Post BP 118/56       Interval HR   1 Minute HR 114     2 Minute HR 122     3 Minute HR 128     4 Minute HR 117     5 Minute HR 120     6 Minute HR 129     2 Minute Post HR 99     Interval Heart Rate? Yes  Interval Oxygen    Interval Oxygen ? Yes     Baseline Oxygen  Saturation % 95 %     1 Minute Oxygen  Saturation % 88 %     1 Minute Liters of Oxygen  0 L     2 Minute Oxygen  Saturation % 88 %     2 Minute Liters of Oxygen  0 L     3 Minute Oxygen  Saturation % 87 %     3 Minute Liters of Oxygen  0 L     4 Minute Oxygen  Saturation % 92 %     4 Minute  Liters of Oxygen  0 L     5 Minute Oxygen  Saturation % 90 %     5 Minute Liters of Oxygen  0 L     6 Minute Oxygen  Saturation % 89 %     6 Minute Liters of Oxygen  0 L     2 Minute Post Oxygen  Saturation % 95 %     2 Minute Post Liters of Oxygen  0 L        Oxygen  Initial Assessment:  Oxygen  Initial Assessment - 01/30/24 1049       Home Oxygen    Home Oxygen  Device None    Sleep Oxygen  Prescription CPAP    Liters per minute 2    Home Exercise Oxygen  Prescription None    Home Resting Oxygen  Prescription None    Compliance with Home Oxygen  Use Yes      Intervention   Short Term Goals To learn and understand importance of monitoring SPO2 with pulse oximeter and demonstrate accurate use of the pulse oximeter.;To learn and understand importance of maintaining oxygen  saturations>88%;To learn and demonstrate proper pursed lip breathing techniques or other breathing techniques. ;To learn and demonstrate proper use of respiratory medications    Long  Term Goals Verbalizes importance of monitoring SPO2 with pulse oximeter and return demonstration;Maintenance of O2 saturations>88%;Exhibits proper breathing techniques, such as pursed lip breathing or other method taught during program session;Compliance with respiratory medication;Demonstrates proper use of MDIs          Oxygen  Re-Evaluation:   Oxygen  Discharge (Final Oxygen  Re-Evaluation):   Initial Exercise Prescription:  Initial Exercise Prescription - 01/30/24 1100       Date of Initial Exercise RX and Referring Provider   Date 01/30/24    Referring Provider Rolan Barrack MD      Treadmill   MPH 1    Grade 0    Minutes 15    METs 1.77      NuStep   Level 1    SPM 50    Minutes 1    METs 1.8      Prescription Details   Frequency (times per week) 3    Duration Progress to 30 minutes of continuous aerobic without signs/symptoms of physical distress      Intensity   THRR 40-80% of Max Heartrate 113-139    Ratings of  Perceived Exertion 11-13    Perceived Dyspnea 0-4      Resistance Training   Training Prescription Yes    Weight 2    Reps 10-15          Perform Capillary Blood Glucose checks as needed.  Exercise Prescription Changes:   Exercise Prescription Changes     Row Name 01/30/24 1100             Response to Exercise   Blood Pressure (Admit) 102/56       Blood Pressure (Exercise) 140/56  Blood Pressure (Exit) 118/56       Heart Rate (Admit) 87 bpm       Heart Rate (Exercise) 129 bpm       Heart Rate (Exit) 99 bpm       Oxygen  Saturation (Admit) 95 %       Oxygen  Saturation (Exercise) 87 %       Oxygen  Saturation (Exit) 95 %       Rating of Perceived Exertion (Exercise) 12       Perceived Dyspnea (Exercise) 2          Exercise Comments:   Exercise Comments     Row Name 01/30/24 1048           Exercise Comments Not much, does household chores at home.          Exercise Goals and Review:   Exercise Goals     Row Name 01/30/24 1047             Exercise Goals   Increase Physical Activity Yes       Intervention Provide advice, education, support and counseling about physical activity/exercise needs.;Develop an individualized exercise prescription for aerobic and resistive training based on initial evaluation findings, risk stratification, comorbidities and participant's personal goals.       Expected Outcomes Short Term: Attend rehab on a regular basis to increase amount of physical activity.;Long Term: Add in home exercise to make exercise part of routine and to increase amount of physical activity.;Long Term: Exercising regularly at least 3-5 days a week.       Increase Strength and Stamina Yes       Intervention Provide advice, education, support and counseling about physical activity/exercise needs.;Develop an individualized exercise prescription for aerobic and resistive training based on initial evaluation findings, risk stratification, comorbidities  and participant's personal goals.       Expected Outcomes Short Term: Increase workloads from initial exercise prescription for resistance, speed, and METs.;Short Term: Perform resistance training exercises routinely during rehab and add in resistance training at home;Long Term: Improve cardiorespiratory fitness, muscular endurance and strength as measured by increased METs and functional capacity ( )       Able to understand and use rate of perceived exertion (RPE) scale Yes       Intervention Provide education and explanation on how to use RPE scale       Expected Outcomes Short Term: Able to use RPE daily in rehab to express subjective intensity level;Long Term:  Able to use RPE to guide intensity level when exercising independently       Able to understand and use Dyspnea scale Yes       Intervention Provide education and explanation on how to use Dyspnea scale       Expected Outcomes Short Term: Able to use Dyspnea scale daily in rehab to express subjective sense of shortness of breath during exertion;Long Term: Able to use Dyspnea scale to guide intensity level when exercising independently       Knowledge and understanding of Target Heart Rate Range (THRR) Yes       Intervention Provide education and explanation of THRR including how the numbers were predicted and where they are located for reference       Expected Outcomes Short Term: Able to state/look up THRR;Short Term: Able to use daily as guideline for intensity in rehab;Long Term: Able to use THRR to govern intensity when exercising independently       Able to check pulse  independently Yes       Intervention Provide education and demonstration on how to check pulse in carotid and radial arteries.;Review the importance of being able to check your own pulse for safety during independent exercise       Expected Outcomes Long Term: Able to check pulse independently and accurately;Short Term: Able to explain why pulse checking is important  during independent exercise       Understanding of Exercise Prescription Yes       Intervention Provide education, explanation, and written materials on patient's individual exercise prescription       Expected Outcomes Short Term: Able to explain program exercise prescription;Long Term: Able to explain home exercise prescription to exercise independently          Exercise Goals Re-Evaluation :   Discharge Exercise Prescription (Final Exercise Prescription Changes):  Exercise Prescription Changes - 01/30/24 1100       Response to Exercise   Blood Pressure (Admit) 102/56    Blood Pressure (Exercise) 140/56    Blood Pressure (Exit) 118/56    Heart Rate (Admit) 87 bpm    Heart Rate (Exercise) 129 bpm    Heart Rate (Exit) 99 bpm    Oxygen  Saturation (Admit) 95 %    Oxygen  Saturation (Exercise) 87 %    Oxygen  Saturation (Exit) 95 %    Rating of Perceived Exertion (Exercise) 12    Perceived Dyspnea (Exercise) 2          Nutrition:  Target Goals: Understanding of nutrition guidelines, daily intake of sodium 1500mg , cholesterol 200mg , calories 30% from fat and 7% or less from saturated fats, daily to have 5 or more servings of fruits and vegetables.  Biometrics:  Pre Biometrics - 01/30/24 1116       Pre Biometrics   Height 5' 4 (1.626 m)    Weight 182 lb 15.7 oz (83 kg)    Waist Circumference 42 inches    Hip Circumference 48.5 inches    Waist to Hip Ratio 0.87 %    BMI (Calculated) 31.39    Grip Strength 15.1 kg           Nutrition Therapy Plan and Nutrition Goals:  Nutrition Therapy & Goals - 01/30/24 1055       Intervention Plan   Intervention Nutrition handout(s) given to patient.;Prescribe, educate and counsel regarding individualized specific dietary modifications aiming towards targeted core components such as weight, hypertension, lipid management, diabetes, heart failure and other comorbidities.    Expected Outcomes Long Term Goal: Adherence to prescribed  nutrition plan.;Short Term Goal: A plan has been developed with personal nutrition goals set during dietitian appointment.;Short Term Goal: Understand basic principles of dietary content, such as calories, fat, sodium, cholesterol and nutrients.          Nutrition Assessments:  MEDIFICTS Score Key: >=70 Need to make dietary changes  40-70 Heart Healthy Diet <= 40 Therapeutic Level Cholesterol Diet  Flowsheet Row PULMONARY VIRTUAL BASED CARE from 01/27/2024 in [facility] CARDIAC REHABILITATION  Picture Your Plate Total Score on Admission 74   Picture Your Plate Scores: <59 Unhealthy dietary pattern with much room for improvement. 41-50 Dietary pattern unlikely to meet recommendations for good health and room for improvement. 51-60 More healthful dietary pattern, with some room for improvement.  >60 Healthy dietary pattern, although there may be some specific behaviors that could be improved.    Nutrition Goals Re-Evaluation:   Nutrition Goals Discharge (Final Nutrition Goals Re-Evaluation):   Psychosocial: Target Goals:  Acknowledge presence or absence of significant depression and/or stress, maximize coping skills, provide positive support system. Participant is able to verbalize types and ability to use techniques and skills needed for reducing stress and depression.  Initial Review & Psychosocial Screening:  Initial Psych Review & Screening - 01/30/24 1051       Initial Review   Current issues with None Identified      Family Dynamics   Good Support System? Yes      Barriers   Psychosocial barriers to participate in program There are no identifiable barriers or psychosocial needs.      Screening Interventions   Interventions Encouraged to exercise    Expected Outcomes Long Term goal: The participant improves quality of Life and PHQ9 Scores as seen by post scores and/or verbalization of changes;Short Term goal: Identification and review with participant of any Quality of  Life or Depression concerns found by scoring the questionnaire.;Long Term Goal: Stressors or current issues are controlled or eliminated.;Short Term goal: Utilizing psychosocial counselor, staff and physician to assist with identification of specific Stressors or current issues interfering with healing process. Setting desired goal for each stressor or current issue identified.          Quality of Life Scores:  Scores of 19 and below usually indicate a poorer quality of life in these areas.  A difference of  2-3 points is a clinically meaningful difference.  A difference of 2-3 points in the total score of the Quality of Life Index has been associated with significant improvement in overall quality of life, self-image, physical symptoms, and general health in studies assessing change in quality of life.   PHQ-9: Review Flowsheet  More data exists      01/27/2024 01/20/2024 11/07/2023 10/25/2023 08/10/2023  Depression screen PHQ 2/9  Decreased Interest 1 0 0 0 0  Down, Depressed, Hopeless 0 0 0 0 0  PHQ - 2 Score 1 0 0 0 0  Altered sleeping 0 0 0 - 0  Tired, decreased energy 1 0 0 - 0  Change in appetite 0 0 0 - 0  Feeling bad or failure about yourself  0 0 0 - 0  Trouble concentrating 0 0 0 - 0  Moving slowly or fidgety/restless 0 0 0 - 0  Suicidal thoughts 0 0 0 - 0  PHQ-9 Score 2 0 0  - 0   Difficult doing work/chores Not difficult at all Not difficult at all Not difficult at all - Not difficult at all    Details       Data saved with a previous flowsheet row definition        Interpretation of Total Score  Total Score Depression Severity:  1-4 = Minimal depression, 5-9 = Mild depression, 10-14 = Moderate depression, 15-19 = Moderately severe depression, 20-27 = Severe depression   Psychosocial Evaluation and Intervention:  Psychosocial Evaluation - 01/30/24 1052       Psychosocial Evaluation & Interventions   Interventions Encouraged to exercise with the program and  follow exercise prescription    Comments Aianna is a pleasant 70 year old female who is coming into pulmonary rehab for dual diagnosis'. She has completed cardiac therapy before back in 2016/2017. She currently denies any stressors or sleep concerns. She doesn't do much exercise at home besides household chores. She works full time for a [employer], and they allow her to leave for these appointments and come back if needed. She currently smokes 1ppd, cessation information  given. She is wanting to increase her leg and arm strength.    Expected Outcomes Short: Increase arm and leg strength. Long: Loose weight.    Continue Psychosocial Services  Follow up required by staff          Psychosocial Re-Evaluation:   Psychosocial Discharge (Final Psychosocial Re-Evaluation):    Education: Education Goals: Education classes will be provided on a weekly basis, covering required topics. Participant will state understanding/return demonstration of topics presented.  Learning Barriers/Preferences:  Learning Barriers/Preferences - 01/30/24 1055       Learning Barriers/Preferences   Learning Barriers None    Learning Preferences None          Education Topics: How Lungs Work and Diseases: - Discuss the anatomy of the lungs and diseases that can affect the lungs, such as COPD.   Exercise: -Discuss the importance of exercise, FITT principles of exercise, normal and abnormal responses to exercise, and how to exercise safely.   Environmental Irritants: -Discuss types of environmental irritants and how to limit exposure to environmental irritants.   Meds/Inhalers and oxygen : - Discuss respiratory medications, definition of an inhaler and oxygen , and the proper way to use an inhaler and oxygen .   Energy Saving Techniques: - Discuss methods to conserve energy and decrease shortness of breath when performing activities of daily living.    Bronchial Hygiene / Breathing  Techniques: - Discuss breathing mechanics, pursed-lip breathing technique,  proper posture, effective ways to clear airways, and other functional breathing techniques   Cleaning Equipment: - Provides group verbal and written instruction about the health risks of elevated stress, cause of high stress, and healthy ways to reduce stress.   Nutrition I: Fats: - Discuss the types of cholesterol, what cholesterol does to the body, and how cholesterol levels can be controlled.   Nutrition II: Labels: -Discuss the different components of food labels and how to read food labels.   Respiratory Infections: - Discuss the signs and symptoms of respiratory infections, ways to prevent respiratory infections, and the importance of seeking medical treatment when having a respiratory infection.   Stress I: Signs and Symptoms: - Discuss the causes of stress, how stress may lead to anxiety and depression, and ways to limit stress.   Stress II: Relaxation: -Discuss relaxation techniques to limit stress.   Oxygen  for Home/Travel: - Discuss how to prepare for travel when on oxygen  and proper ways to transport and store oxygen  to ensure safety.   Knowledge Questionnaire Score:  Knowledge Questionnaire Score - 01/27/24 1411       Knowledge Questionnaire Score   Pre Score 14/18          Core Components/Risk Factors/Patient Goals at Admission:  Personal Goals and Risk Factors at Admission - 01/30/24 1048       Core Components/Risk Factors/Patient Goals on Admission    Weight Management Yes    Intervention Weight Management: Develop a combined nutrition and exercise program designed to reach desired caloric intake, while maintaining appropriate intake of nutrient and fiber, sodium and fats, and appropriate energy expenditure required for the weight goal.;Weight Management/Obesity: Establish reasonable short term and long term weight goals.;Weight Management: Provide education and appropriate  resources to help participant work on and attain dietary goals.    Expected Outcomes Short Term: Continue to assess and modify interventions until short term weight is achieved;Long Term: Adherence to nutrition and physical activity/exercise program aimed toward attainment of established weight goal;Weight Maintenance: Understanding of the daily nutrition guidelines, which includes 25-35%  calories from fat, 7% or less cal from saturated fats, less than 200mg  cholesterol, less than 1.5gm of sodium, & 5 or more servings of fruits and vegetables daily;Understanding recommendations for meals to include 15-35% energy as protein, 25-35% energy from fat, 35-60% energy from carbohydrates, less than 200mg  of dietary cholesterol, 20-35 gm of total fiber daily;Understanding of distribution of calorie intake throughout the day with the consumption of 4-5 meals/snacks    Tobacco Cessation Yes    Number of packs per day 1 ppd    Intervention Assist the participant in steps to quit. Provide individualized education and counseling about committing to Tobacco Cessation, relapse prevention, and pharmacological support that can be provided by physician.;Education officer, environmental, assist with locating and accessing local/national Quit Smoking programs, and support quit date choice.    Expected Outcomes Short Term: Will demonstrate readiness to quit, by selecting a quit date.;Short Term: Will quit all tobacco product use, adhering to prevention of relapse plan.;Long Term: Complete abstinence from all tobacco products for at least 12 months from quit date.    Improve shortness of breath with ADL's Yes    Intervention Provide education, individualized exercise plan and daily activity instruction to help decrease symptoms of SOB with activities of daily living.    Expected Outcomes Short Term: Improve cardiorespiratory fitness to achieve a reduction of symptoms when performing ADLs;Long Term: Be able to perform more ADLs  without symptoms or delay the onset of symptoms    Increase knowledge of respiratory medications and ability to use respiratory devices properly  Yes    Intervention Provide education and demonstration as needed of appropriate use of medications, inhalers, and oxygen  therapy.    Expected Outcomes Short Term: Achieves understanding of medications use. Understands that oxygen  is a medication prescribed by physician. Demonstrates appropriate use of inhaler and oxygen  therapy.;Long Term: Maintain appropriate use of medications, inhalers, and oxygen  therapy.    Diabetes Yes    Intervention Provide education about signs/symptoms and action to take for hypo/hyperglycemia.;Provide education about proper nutrition, including hydration, and aerobic/resistive exercise prescription along with prescribed medications to achieve blood glucose in normal ranges: Fasting glucose 65-99 mg/dL    Expected Outcomes Long Term: Attainment of HbA1C < 7%.;Short Term: Participant verbalizes understanding of the signs/symptoms and immediate care of hyper/hypoglycemia, proper foot care and importance of medication, aerobic/resistive exercise and nutrition plan for blood glucose control.    Heart Failure Yes    Intervention Provide a combined exercise and nutrition program that is supplemented with education, support and counseling about heart failure. Directed toward relieving symptoms such as shortness of breath, decreased exercise tolerance, and extremity edema.    Expected Outcomes Improve functional capacity of life;Short term: Attendance in program 2-3 days a week with increased exercise capacity. Reported lower sodium intake. Reported increased fruit and vegetable intake. Reports medication compliance.;Short term: Daily weights obtained and reported for increase. Utilizing diuretic protocols set by physician.;Long term: Adoption of self-care skills and reduction of barriers for early signs and symptoms recognition and  intervention leading to self-care maintenance.    Hypertension Yes    Intervention Monitor prescription use compliance.;Provide education on lifestyle modifcations including regular physical activity/exercise, weight management, moderate sodium restriction and increased consumption of fresh fruit, vegetables, and low fat dairy, alcohol  moderation, and smoking cessation.    Expected Outcomes Long Term: Maintenance of blood pressure at goal levels.;Short Term: Continued assessment and intervention until BP is < 140/62mm HG in hypertensive participants. < 130/85mm HG in hypertensive participants with  diabetes, heart failure or chronic kidney disease.    Lipids Yes    Intervention Provide education and support for participant on nutrition & aerobic/resistive exercise along with prescribed medications to achieve LDL 70mg , HDL >40mg .    Expected Outcomes Short Term: Participant states understanding of desired cholesterol values and is compliant with medications prescribed. Participant is following exercise prescription and nutrition guidelines.;Long Term: Cholesterol controlled with medications as prescribed, with individualized exercise RX and with personalized nutrition plan. Value goals: LDL < 70mg , HDL > 40 mg.          Core Components/Risk Factors/Patient Goals Review:    Core Components/Risk Factors/Patient Goals at Discharge (Final Review):    ITP Comments:  ITP Comments     Row Name 01/30/24 1047 02/21/24 1039         ITP Comments Patient attend orientation today.  Patient is attending Pulmonary Rehabilitation Program.  Documentation for diagnosis [facility] be found in [facility] 12/29.  Reviewed medical chart, RPE/RPD, gym safety, and program guidelines.  Patient was fitted to equipment they will be using during rehab.  Patient is scheduled to start exercise on 02/06/24.   Initial ITP created and sent for review and signature by Dr. Anton Kelp, Medical Director for Pulmonary Rehabilitation  Program. 30 day review completed. ITP sent to Dr.Jehanzeb Memon, Medical Director of  Pulmonary Rehab. Continue with ITP unless changes are made by physician.  Patient has yet to start program exercise as she was sick and then went out of town.         Comments: 30 day review      [1]  Current Outpatient Medications:    albuterol  (VENTOLIN  HFA) 108 (90 Base) MCG/ACT inhaler, Inhale 2 puffs into the lungs every 6 (six) hours as needed for wheezing or shortness of breath., Disp: , Rfl:    amLODipine  (NORVASC ) 2.5 MG tablet, Take 1 tablet (2.5 mg total) by mouth daily., Disp: 90 tablet, Rfl: 3   apixaban  (ELIQUIS ) 5 MG TABS tablet, Take 1 tablet (5 mg total) by mouth 2 (two) times daily., Disp: 180 tablet, Rfl: 3   atorvastatin  (LIPITOR ) 40 MG tablet, Take 1 tablet (40 mg total) by mouth daily. Take at dinner, Disp: 90 tablet, Rfl: 1   budesonide -formoterol  (BREYNA ) 80-4.5 MCG/ACT inhaler, Inhale 2 puffs into the lungs 2 (two) times daily., Disp: , Rfl:    Continuous Glucose Sensor (FREESTYLE LIBRE 3 PLUS SENSOR) MISC, Change sensor every 15 days., Disp: 6 each, Rfl: 3   dapagliflozin  propanediol (FARXIGA ) 10 MG TABS tablet, Take 1 tablet (10 mg total) by mouth daily., Disp: 90 tablet, Rfl: 3   escitalopram  (LEXAPRO ) 20 MG tablet, TAKE 1 TABLET (20 MG TOTAL) BY MOUTH DAILY AT 6 (SIX) AM., Disp: 90 tablet, Rfl: 1   macitentan  (OPSUMIT ) 10 MG tablet, Take 1 tablet (10 mg total) by mouth daily., Disp: 90 tablet, Rfl: 3   metFORMIN  (GLUCOPHAGE ) 1000 MG tablet, Take 1 tablet (1,000 mg total) by mouth 2 (two) times daily with a meal., Disp: 180 tablet, Rfl: 3   nystatin  cream (MYCOSTATIN ), Apply 1 Application topically as needed for dry skin., Disp: , Rfl:    Riociguat  (ADEMPAS ) 2 MG TABS, Take 2 mg by mouth in the morning, at noon, and at bedtime., Disp: , Rfl:    tirzepatide  (MOUNJARO ) 5 MG/0.5ML Pen, Inject 5 mg into the skin once a week., Disp: 6 mL, Rfl: 1   torsemide  (DEMADEX ) 20 MG tablet, Take  1 tablet (20 mg total) by  mouth daily., Disp: 90 tablet, Rfl: 3 [2]  Social History Tobacco Use  Smoking Status Some Days   Current packs/day: 1.00   Average packs/day: 1 pack/day for 51.2 years (51.2 ttl pk-yrs)   Types: Cigarettes   Start date: 12/26/1972  Smokeless Tobacco Never  Tobacco Comments   3 cigs per day currently 11/07/23

## 2024-02-22 ENCOUNTER — Encounter (HOSPITAL_COMMUNITY)

## 2024-02-24 ENCOUNTER — Encounter (HOSPITAL_COMMUNITY)

## 2024-02-24 ENCOUNTER — Other Ambulatory Visit (HOSPITAL_COMMUNITY): Payer: Self-pay

## 2024-02-27 ENCOUNTER — Ambulatory Visit (HOSPITAL_COMMUNITY)

## 2024-02-29 ENCOUNTER — Encounter (HOSPITAL_COMMUNITY)

## 2024-03-02 ENCOUNTER — Encounter (HOSPITAL_COMMUNITY)
Admission: RE | Admit: 2024-03-02 | Disposition: A | Discharge status: Home / Self Care | Source: Ambulatory Visit | Attending: Internal Medicine | Admitting: Internal Medicine

## 2024-03-02 DIAGNOSIS — I272 Pulmonary hypertension, unspecified: Secondary | ICD-10-CM

## 2024-03-02 DIAGNOSIS — I5032 Chronic diastolic (congestive) heart failure: Secondary | ICD-10-CM

## 2024-03-02 LAB — GLUCOSE, CAPILLARY
Glucose-Capillary: 103 mg/dL — ABNORMAL HIGH (ref 70–99)
Glucose-Capillary: 107 mg/dL — ABNORMAL HIGH (ref 70–99)

## 2024-03-02 NOTE — Progress Notes (Signed)
 Daily Session Note  Patient Details  Name: [name] MRN: [id number] Date of Birth: 14-Oct-1955 Referring Provider:   Flowsheet Row PULMONARY REHAB OTHER RESP ORIENTATION from 01/30/2024 in [facility] CARDI[facility] RE[facility]  Referring Provider Rolan Barrack MD    Encounter Date: 03/02/2024  Check In:  Session Check In - 03/02/24 1326       Check-In   Supervising physician immediately available to respond to emergencies See telemetry face sheet for immediately available MD    Location [facility]    Staff Present Powell Benders, BS, Exercise Physiologist;Debra Vicci, RN, BSN;Brittany Jackquline, BSN, RN, WTA-C;Victoria Zina, RN    Virtual Visit No    Medication changes reported     No    Fall or balance concerns reported    No    Tobacco Cessation No Change    Warm-up and Cool-down Performed on first and last piece of equipment    Resistance Training Performed Yes    VAD Patient? No    PAD/SET Patient? No      Pain Assessment   Currently in Pain? No/denies    Multiple Pain Sites No          Capillary Blood Glucose: No results found for this or any previous visit (from the past 24 hours).    Tobacco Use History[1]  Goals Met:  Independence with exercise equipment Using PLB without cueing & demonstrates good technique Exercise tolerated well Queuing for purse lip breathing No report of concerns or symptoms today Strength training completed today  Goals Unmet:  Not Applicable  Comments: First full day of exercise!  Patient was oriented to gym and equipment including functions, settings, policies, and procedures.  Patient's individual exercise prescription and treatment plan were reviewed.  All starting workloads were established based on the results of the 6 minute walk test done at initial orientation visit.  The plan for exercise progression was also introduced and progression will be customized based on patient's performance and goals.         [1]  Social History Tobacco Use  Smoking Status Some Days   Current packs/day: 1.00   Average packs/day: 1 pack/day for 51.2 years (51.2 ttl pk-yrs)   Types: Cigarettes   Start date: 12/26/1972  Smokeless Tobacco Never  Tobacco Comments   3 cigs per day currently 11/07/23

## 2024-03-05 ENCOUNTER — Encounter (HOSPITAL_COMMUNITY)

## 2024-03-07 ENCOUNTER — Encounter (HOSPITAL_COMMUNITY)

## 2024-03-08 ENCOUNTER — Ambulatory Visit (HOSPITAL_COMMUNITY): Admitting: Cardiology

## 2024-03-09 ENCOUNTER — Encounter (HOSPITAL_COMMUNITY)

## 2024-03-12 ENCOUNTER — Encounter (HOSPITAL_COMMUNITY)

## 2024-03-14 ENCOUNTER — Encounter (HOSPITAL_COMMUNITY)

## 2024-03-16 ENCOUNTER — Encounter (HOSPITAL_COMMUNITY)

## 2024-03-19 ENCOUNTER — Encounter (HOSPITAL_COMMUNITY)

## 2024-03-21 ENCOUNTER — Encounter (HOSPITAL_COMMUNITY)

## 2024-03-23 ENCOUNTER — Encounter (HOSPITAL_COMMUNITY)

## 2024-03-26 ENCOUNTER — Encounter (HOSPITAL_COMMUNITY): Attending: Internal Medicine

## 2024-03-28 ENCOUNTER — Encounter (HOSPITAL_COMMUNITY)

## 2024-03-30 ENCOUNTER — Encounter (HOSPITAL_COMMUNITY)

## 2024-04-02 ENCOUNTER — Encounter (HOSPITAL_COMMUNITY)

## 2024-04-04 ENCOUNTER — Encounter (HOSPITAL_COMMUNITY)

## 2024-04-06 ENCOUNTER — Encounter (HOSPITAL_COMMUNITY)

## 2024-04-09 ENCOUNTER — Encounter (HOSPITAL_COMMUNITY)

## 2024-04-11 ENCOUNTER — Encounter (HOSPITAL_COMMUNITY)

## 2024-04-13 ENCOUNTER — Encounter (HOSPITAL_COMMUNITY)

## 2024-04-16 ENCOUNTER — Encounter (HOSPITAL_COMMUNITY)

## 2024-04-18 ENCOUNTER — Encounter (HOSPITAL_COMMUNITY)

## 2024-04-20 ENCOUNTER — Encounter (HOSPITAL_COMMUNITY)

## 2024-04-23 ENCOUNTER — Encounter (HOSPITAL_COMMUNITY)

## 2024-04-25 ENCOUNTER — Encounter (HOSPITAL_COMMUNITY)

## 2024-04-25 ENCOUNTER — Ambulatory Visit: Admitting: Internal Medicine

## 2024-04-27 ENCOUNTER — Encounter (HOSPITAL_COMMUNITY): Attending: Internal Medicine
